# Patient Record
Sex: Female | Born: 1940 | Race: White | Hispanic: No | Smoking: Never smoker
Health system: Southern US, Community
[De-identification: ages and names within clinical notes are randomized; demographics above are authoritative.]

## PROBLEM LIST (undated history)

## (undated) ENCOUNTER — Emergency Department (HOSPITAL_COMMUNITY): Admission: EM | Discharge status: Absent without leave | Payer: Medicare HMO | Source: Home / Self Care

## (undated) DIAGNOSIS — R011 Cardiac murmur, unspecified: Secondary | ICD-10-CM

## (undated) DIAGNOSIS — G4730 Sleep apnea, unspecified: Secondary | ICD-10-CM

## (undated) DIAGNOSIS — E039 Hypothyroidism, unspecified: Secondary | ICD-10-CM

## (undated) DIAGNOSIS — K219 Gastro-esophageal reflux disease without esophagitis: Secondary | ICD-10-CM

## (undated) DIAGNOSIS — M51369 Other intervertebral disc degeneration, lumbar region without mention of lumbar back pain or lower extremity pain: Secondary | ICD-10-CM

## (undated) DIAGNOSIS — M5136 Other intervertebral disc degeneration, lumbar region: Secondary | ICD-10-CM

## (undated) DIAGNOSIS — R3915 Urgency of urination: Secondary | ICD-10-CM

## (undated) DIAGNOSIS — I639 Cerebral infarction, unspecified: Secondary | ICD-10-CM

## (undated) DIAGNOSIS — I739 Peripheral vascular disease, unspecified: Secondary | ICD-10-CM

## (undated) DIAGNOSIS — E119 Type 2 diabetes mellitus without complications: Secondary | ICD-10-CM

## (undated) DIAGNOSIS — G8929 Other chronic pain: Secondary | ICD-10-CM

## (undated) DIAGNOSIS — IMO0001 Reserved for inherently not codable concepts without codable children: Secondary | ICD-10-CM

## (undated) DIAGNOSIS — C55 Malignant neoplasm of uterus, part unspecified: Secondary | ICD-10-CM

## (undated) DIAGNOSIS — I5020 Unspecified systolic (congestive) heart failure: Secondary | ICD-10-CM

## (undated) DIAGNOSIS — M5126 Other intervertebral disc displacement, lumbar region: Secondary | ICD-10-CM

## (undated) DIAGNOSIS — I10 Essential (primary) hypertension: Secondary | ICD-10-CM

## (undated) DIAGNOSIS — M545 Low back pain, unspecified: Secondary | ICD-10-CM

## (undated) DIAGNOSIS — M1990 Unspecified osteoarthritis, unspecified site: Secondary | ICD-10-CM

## (undated) DIAGNOSIS — E785 Hyperlipidemia, unspecified: Secondary | ICD-10-CM

## (undated) DIAGNOSIS — R39198 Other difficulties with micturition: Secondary | ICD-10-CM

## (undated) HISTORY — PX: BREAST SURGERY: SHX581

## (undated) HISTORY — DX: Unspecified systolic (congestive) heart failure: I50.20

## (undated) HISTORY — DX: Hyperlipidemia, unspecified: E78.5

## (undated) HISTORY — PX: BREAST EXCISIONAL BIOPSY: SUR124

## (undated) HISTORY — DX: Type 2 diabetes mellitus without complications: E11.9

## (undated) HISTORY — PX: CARPAL TUNNEL RELEASE: SHX101

## (undated) HISTORY — PX: VAGINAL HYSTERECTOMY: SUR661

## (undated) HISTORY — PX: DILATION AND CURETTAGE OF UTERUS: SHX78

## (undated) HISTORY — PX: BALLOON ANGIOPLASTY, ARTERY: SHX564

## (undated) HISTORY — DX: Essential (primary) hypertension: I10

## (undated) HISTORY — PX: TUBAL LIGATION: SHX77

## (undated) HISTORY — DX: Peripheral vascular disease, unspecified: I73.9

## (undated) HISTORY — PX: INCISION AND DRAINAGE BREAST ABSCESS: SUR672

## (undated) HISTORY — PX: HEMORRHOID SURGERY: SHX153

---

## 2001-07-06 ENCOUNTER — Encounter: Admission: RE | Admit: 2001-07-06 | Discharge: 2001-07-06 | Payer: Self-pay | Admitting: Internal Medicine

## 2001-07-06 ENCOUNTER — Encounter: Payer: Self-pay | Admitting: Internal Medicine

## 2001-12-21 ENCOUNTER — Encounter: Payer: Self-pay | Admitting: Internal Medicine

## 2001-12-21 ENCOUNTER — Encounter: Admission: RE | Admit: 2001-12-21 | Discharge: 2001-12-21 | Payer: Self-pay | Admitting: Internal Medicine

## 2002-05-29 ENCOUNTER — Encounter: Payer: Self-pay | Admitting: Internal Medicine

## 2002-05-29 ENCOUNTER — Encounter: Admission: RE | Admit: 2002-05-29 | Discharge: 2002-05-29 | Payer: Self-pay | Admitting: Internal Medicine

## 2002-08-05 ENCOUNTER — Encounter: Payer: Self-pay | Admitting: Specialist

## 2002-08-05 ENCOUNTER — Encounter: Admission: RE | Admit: 2002-08-05 | Discharge: 2002-08-05 | Payer: Self-pay | Admitting: Specialist

## 2002-10-07 ENCOUNTER — Encounter: Admission: RE | Admit: 2002-10-07 | Discharge: 2002-10-07 | Payer: Self-pay | Admitting: Specialist

## 2002-10-07 ENCOUNTER — Encounter: Payer: Self-pay | Admitting: Specialist

## 2004-07-18 ENCOUNTER — Ambulatory Visit (HOSPITAL_COMMUNITY): Admission: RE | Admit: 2004-07-18 | Discharge: 2004-07-18 | Payer: Self-pay | Admitting: Obstetrics and Gynecology

## 2005-10-06 ENCOUNTER — Ambulatory Visit (HOSPITAL_COMMUNITY): Admission: RE | Admit: 2005-10-06 | Discharge: 2005-10-06 | Payer: Self-pay | Admitting: Internal Medicine

## 2006-11-24 ENCOUNTER — Encounter: Admission: RE | Admit: 2006-11-24 | Discharge: 2006-11-24 | Payer: Self-pay | Admitting: Internal Medicine

## 2008-08-04 ENCOUNTER — Encounter: Admission: RE | Admit: 2008-08-04 | Discharge: 2008-08-04 | Payer: Self-pay | Admitting: Internal Medicine

## 2009-08-13 ENCOUNTER — Encounter: Admission: RE | Admit: 2009-08-13 | Discharge: 2009-08-13 | Payer: Self-pay | Admitting: Internal Medicine

## 2010-07-30 ENCOUNTER — Other Ambulatory Visit: Payer: Self-pay | Admitting: *Deleted

## 2010-07-30 DIAGNOSIS — Z780 Asymptomatic menopausal state: Secondary | ICD-10-CM

## 2010-07-30 DIAGNOSIS — Z1231 Encounter for screening mammogram for malignant neoplasm of breast: Secondary | ICD-10-CM

## 2010-08-15 ENCOUNTER — Ambulatory Visit
Admission: RE | Admit: 2010-08-15 | Discharge: 2010-08-15 | Disposition: A | Discharge status: Home / Self Care | Payer: Medicare Other | Source: Ambulatory Visit | Attending: *Deleted | Admitting: *Deleted

## 2010-08-15 DIAGNOSIS — Z780 Asymptomatic menopausal state: Secondary | ICD-10-CM

## 2010-08-15 DIAGNOSIS — Z1231 Encounter for screening mammogram for malignant neoplasm of breast: Secondary | ICD-10-CM

## 2011-08-01 ENCOUNTER — Other Ambulatory Visit: Payer: Self-pay | Admitting: *Deleted

## 2011-08-01 DIAGNOSIS — Z1231 Encounter for screening mammogram for malignant neoplasm of breast: Secondary | ICD-10-CM

## 2011-08-22 ENCOUNTER — Ambulatory Visit: Payer: Medicare Other

## 2011-09-19 ENCOUNTER — Ambulatory Visit
Admission: RE | Admit: 2011-09-19 | Discharge: 2011-09-19 | Disposition: A | Discharge status: Home / Self Care | Payer: Medicare Other | Source: Ambulatory Visit | Attending: *Deleted | Admitting: *Deleted

## 2011-09-19 DIAGNOSIS — Z1231 Encounter for screening mammogram for malignant neoplasm of breast: Secondary | ICD-10-CM

## 2012-09-02 ENCOUNTER — Other Ambulatory Visit: Payer: Self-pay

## 2012-09-02 DIAGNOSIS — Z1231 Encounter for screening mammogram for malignant neoplasm of breast: Secondary | ICD-10-CM

## 2012-10-01 ENCOUNTER — Ambulatory Visit
Admission: RE | Admit: 2012-10-01 | Discharge: 2012-10-01 | Disposition: A | Discharge status: Home / Self Care | Payer: Medicare Other | Source: Ambulatory Visit

## 2012-10-01 DIAGNOSIS — Z1231 Encounter for screening mammogram for malignant neoplasm of breast: Secondary | ICD-10-CM

## 2013-08-30 ENCOUNTER — Other Ambulatory Visit: Payer: Self-pay

## 2013-08-30 DIAGNOSIS — Z1231 Encounter for screening mammogram for malignant neoplasm of breast: Secondary | ICD-10-CM

## 2013-10-05 ENCOUNTER — Ambulatory Visit
Admission: RE | Admit: 2013-10-05 | Discharge: 2013-10-05 | Disposition: A | Discharge status: Home / Self Care | Payer: Commercial Managed Care - HMO | Source: Ambulatory Visit

## 2013-10-05 DIAGNOSIS — Z1231 Encounter for screening mammogram for malignant neoplasm of breast: Secondary | ICD-10-CM

## 2014-06-14 ENCOUNTER — Ambulatory Visit (INDEPENDENT_AMBULATORY_CARE_PROVIDER_SITE_OTHER): Payer: Medicare HMO | Admitting: Podiatry

## 2014-06-14 ENCOUNTER — Ambulatory Visit (INDEPENDENT_AMBULATORY_CARE_PROVIDER_SITE_OTHER): Payer: Medicare HMO

## 2014-06-14 DIAGNOSIS — M79672 Pain in left foot: Secondary | ICD-10-CM | POA: Diagnosis not present

## 2014-06-14 DIAGNOSIS — M79671 Pain in right foot: Secondary | ICD-10-CM

## 2014-06-14 DIAGNOSIS — B351 Tinea unguium: Secondary | ICD-10-CM | POA: Diagnosis not present

## 2014-06-14 DIAGNOSIS — M722 Plantar fascial fibromatosis: Secondary | ICD-10-CM

## 2014-06-14 DIAGNOSIS — E1151 Type 2 diabetes mellitus with diabetic peripheral angiopathy without gangrene: Secondary | ICD-10-CM | POA: Diagnosis not present

## 2014-06-14 MED ORDER — TRIAMCINOLONE ACETONIDE 10 MG/ML IJ SUSP
10.0000 mg | Freq: Once | INTRAMUSCULAR | Status: AC
  Administered 2014-06-14: 10 mg

## 2014-06-14 NOTE — Progress Notes (Signed)
   Subjective:    Patient ID: Jessica Torres, female    DOB: April 29, 1940, 74 y.o.   MRN: [id number]  HPI Pt presents as a diabetic with controlled blood glucose, she c/o bilateral foot and calf pain, worse in left heel, also has thickened great left toenail   Review of Systems  HENT: Positive for hearing loss and tinnitus.   Genitourinary: Positive for frequency.  Musculoskeletal: Positive for gait problem.  Neurological: Positive for numbness.  All other systems reviewed and are negative.      Objective:   Physical Exam        Assessment & Plan:

## 2014-06-15 NOTE — Progress Notes (Signed)
Subjective:     Patient ID: Jessica Torres, female   DOB: 05-28-1940, 74 y.o.   MRN: [id number]  HPI patient presents with chronic pain in the heels of both feet and nail disease 1-5 both feet with thick yellow debris and pain and ability to wear shoe gear comfortably. States the heels that been present for several months with the left being worse   Review of Systems  All other systems reviewed and are negative.      Objective:   Physical Exam  Constitutional: She is oriented to person, place, and time.  Cardiovascular: Intact distal pulses.   Musculoskeletal: Normal range of motion.  Neurological: She is oriented to person, place, and time.  Skin: Skin is warm and dry.  Nursing note and vitals reviewed.  neurovascular status found to be unchanged with pain in the left heel and noted to have diminishment of vibratory status. Nailbeds are found to be thickened and painful in the corners of the heel is what's bothering her the most     Assessment:     Plantar fasciitis and nail disease    Plan:     H&P and condition discussed. At this time I did inject the heel carefully 3 Milligan Kenalog 5 g Xylocaine and advised on watching her sugars. I then went ahead and I debrided nails and will be seen back for routine care

## 2014-06-19 ENCOUNTER — Ambulatory Visit (HOSPITAL_COMMUNITY)
Admission: RE | Admit: 2014-06-19 | Discharge: 2014-06-19 | Disposition: A | Discharge status: Home / Self Care | Payer: Medicare HMO | Source: Ambulatory Visit | Attending: Cardiology | Admitting: Cardiology

## 2014-06-19 DIAGNOSIS — R200 Anesthesia of skin: Secondary | ICD-10-CM | POA: Diagnosis not present

## 2014-06-19 DIAGNOSIS — R202 Paresthesia of skin: Secondary | ICD-10-CM | POA: Diagnosis not present

## 2014-06-19 DIAGNOSIS — E1151 Type 2 diabetes mellitus with diabetic peripheral angiopathy without gangrene: Secondary | ICD-10-CM | POA: Diagnosis not present

## 2014-06-19 DIAGNOSIS — I739 Peripheral vascular disease, unspecified: Secondary | ICD-10-CM | POA: Diagnosis not present

## 2014-06-19 NOTE — Progress Notes (Signed)
Arterial Duplex Lower Ext. Completed. 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  Oda Cogan, BS, RDMS, RVT

## 2014-06-21 ENCOUNTER — Encounter (HOSPITAL_COMMUNITY): Payer: Commercial Managed Care - HMO

## 2014-06-28 ENCOUNTER — Encounter: Payer: Self-pay | Admitting: Cardiovascular Disease

## 2014-06-28 ENCOUNTER — Ambulatory Visit (INDEPENDENT_AMBULATORY_CARE_PROVIDER_SITE_OTHER): Payer: Medicare HMO | Admitting: Cardiovascular Disease

## 2014-06-28 VITALS — BP 122/64 | HR 80 | Ht 65.0 in | Wt 141.7 lb

## 2014-06-28 DIAGNOSIS — R5381 Other malaise: Secondary | ICD-10-CM

## 2014-06-28 DIAGNOSIS — I739 Peripheral vascular disease, unspecified: Secondary | ICD-10-CM | POA: Diagnosis not present

## 2014-06-28 DIAGNOSIS — E785 Hyperlipidemia, unspecified: Secondary | ICD-10-CM

## 2014-06-28 DIAGNOSIS — E1059 Type 1 diabetes mellitus with other circulatory complications: Secondary | ICD-10-CM | POA: Insufficient documentation

## 2014-06-28 DIAGNOSIS — E1169 Type 2 diabetes mellitus with other specified complication: Secondary | ICD-10-CM | POA: Insufficient documentation

## 2014-06-28 DIAGNOSIS — E1165 Type 2 diabetes mellitus with hyperglycemia: Secondary | ICD-10-CM

## 2014-06-28 DIAGNOSIS — R5383 Other fatigue: Secondary | ICD-10-CM

## 2014-06-28 DIAGNOSIS — E1151 Type 2 diabetes mellitus with diabetic peripheral angiopathy without gangrene: Secondary | ICD-10-CM

## 2014-06-28 DIAGNOSIS — D689 Coagulation defect, unspecified: Secondary | ICD-10-CM | POA: Diagnosis not present

## 2014-06-28 DIAGNOSIS — Z01818 Encounter for other preprocedural examination: Secondary | ICD-10-CM

## 2014-06-28 DIAGNOSIS — E119 Type 2 diabetes mellitus without complications: Secondary | ICD-10-CM | POA: Insufficient documentation

## 2014-06-28 DIAGNOSIS — I152 Hypertension secondary to endocrine disorders: Secondary | ICD-10-CM | POA: Insufficient documentation

## 2014-06-28 DIAGNOSIS — I10 Essential (primary) hypertension: Secondary | ICD-10-CM

## 2014-06-28 DIAGNOSIS — E1159 Type 2 diabetes mellitus with other circulatory complications: Secondary | ICD-10-CM | POA: Insufficient documentation

## 2014-06-28 NOTE — Assessment & Plan Note (Signed)
History of peripheral arterial disease with a two-month history of lifestyle limiting claudication symmetric bilaterally. Recent Dopplers performed 06/19/14 revealed ABIs in the mid 0.5 range with occluded SFAs bilaterally. Based on this, we decided to proceed with angiography and potential endovascular therapy.

## 2014-06-28 NOTE — Assessment & Plan Note (Signed)
History of hypertension blood pressure measured today at 122/64. She is on losartan 50 mg a day. Continue current meds at current dosing

## 2014-06-28 NOTE — Patient Instructions (Addendum)
Dr. Gwenlyn Found has ordered a peripheral angiogram on Thursday, April 28th, to be done at [facility].  This procedure is going to look at the bloodflow in your lower extremities.  If Dr. Gwenlyn Found is able to open up the arteries, you will have to spend one night in the hospital.  If he is not able to open the arteries, you will be able to go home that same day.    After the procedure, you will not be allowed to drive for 3 days or push, pull, or lift anything greater than 10 lbs for one week.    You will be required to have the following tests prior to the procedure:  1. Blood work-the blood work can be done no more than 7 days prior to the procedure.  It can be done at any [facility] lab.  There is one downstairs on the first floor of this building and one in the [place] ([address])  2. Chest Xray-the chest xray order has already been placed at the [facility].     *REPS Scott   Right groin access

## 2014-06-28 NOTE — Assessment & Plan Note (Signed)
History of hyperlipidemia with statin intolerance

## 2014-06-28 NOTE — Progress Notes (Signed)
06/28/2014 SENAI LEBOVITS   November 02, 1940  [id number]  Primary Physician [name], DO Primary Cardiologist: Lorretta Harp MD Renae Gloss   HPI:  Mrs. Hellstrom is a 74 year old divorced Caucasian female mother of 4 children, grandmother of 9 grandchildren referred by Dr. Felisa Bonier, podiatrist, for evaluation of her 4 children disease. Her primary care physician is Dr. [name]. She is retired from working at [employer]. Her cardiac risk factor profile is notable for treated hypertension and diabetes. She does have hyperlipidemia but a statin intolerant. 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 She's never had a heart attack. She denies chest pain or shortness of breath. There is no family history of heart disease. She's noticed bilateral lower extremity lifestyle limiting claudication of the last several months. Recent lower extremity arterial Doppler studies performed 06/19/14 revealed ABIs in the mid 0.5 range bilaterally with occluded SFAs.   Current Outpatient Prescriptions  Medication Sig Dispense Refill  . alendronate (FOSAMAX) 10 MG tablet Take 10 mg by mouth once a week. Take with a full glass of water on an empty stomach.    Marland Kitchen aspirin 81 MG tablet Take 81 mg by mouth daily.    Marland Kitchen glimepiride (AMARYL) 4 MG tablet Take 4 mg by mouth daily with breakfast.    . levothyroxine (SYNTHROID, LEVOTHROID) 75 MCG tablet Take 75 mcg by mouth daily before breakfast.    . losartan (COZAAR) 50 MG tablet Take 50 mg by mouth daily.    . metFORMIN (GLUCOPHAGE) 1000 MG tablet Take 1,000 mg by mouth 2 (two) times daily with a meal.    . pantoprazole (PROTONIX) 40 MG tablet Take 1 tablet by mouth daily.    . sitaGLIPtin (JANUVIA) 100 MG tablet Take 100 mg by mouth daily.     No current facility-administered medications for this visit.    Allergies  Allergen Reactions  . Morphine And Related Swelling    Facial swelling    History   Social History  . Marital  Status: Divorced    Spouse Name: N/A  . Number of Children: N/A  . Years of Education: N/A   Occupational History  . Not on file.   Social History Main Topics  . Smoking status: Never Smoker   . Smokeless tobacco: Not on file  . Alcohol Use: Not on file  . Drug Use: Not on file  . Sexual Activity: Not on file   Other Topics Concern  . Not on file   Social History Narrative     Review of Systems: General: negative for chills, fever, night sweats or weight changes.  Cardiovascular: negative for chest pain, dyspnea on exertion, edema, orthopnea, palpitations, paroxysmal nocturnal dyspnea or shortness of breath Dermatological: negative for rash Respiratory: negative for cough or wheezing Urologic: negative for hematuria Abdominal: negative for nausea, vomiting, diarrhea, bright red blood per rectum, melena, or hematemesis Neurologic: negative for visual changes, syncope, or dizziness All other systems reviewed and are otherwise negative except as noted above.    Blood pressure 122/64, pulse 80, height 5\' 5"  (1.651 m), weight 141 lb 11.2 oz (64.275 kg).  General appearance: alert and no distress Neck: no adenopathy, no carotid bruit, no JVD, supple, symmetrical, trachea midline and thyroid not enlarged, symmetric, no tenderness/mass/nodules Lungs: clear to auscultation bilaterally Heart: regular rate and rhythm, S1, S2 normal, no murmur, click, rub or gallop Extremities: extremities normal, atraumatic, no cyanosis or edema and femoral arteries are 2+ without bruits  EKG not performed today  ASSESSMENT AND PLAN:   Hyperlipidemia History of hyperlipidemia with statin intolerance   Essential hypertension History of hypertension blood pressure measured today at 122/64. She is on losartan 50 mg a day. Continue current meds at current dosing   Peripheral arterial disease History of peripheral arterial disease with a two-month history of lifestyle limiting claudication  symmetric bilaterally. Recent Dopplers performed 06/19/14 revealed ABIs in the mid 0.5 range with occluded SFAs bilaterally. Based on this, we decided to proceed with angiography and potential endovascular therapy.       Lorretta Harp MD FACP,FACC,FAHA, [facility] 06/28/2014 2:53 PM

## 2014-06-29 ENCOUNTER — Ambulatory Visit
Admission: RE | Admit: 2014-06-29 | Discharge: 2014-06-29 | Disposition: A | Discharge status: Home / Self Care | Payer: Medicare HMO | Source: Ambulatory Visit | Attending: Cardiovascular Disease | Admitting: Cardiovascular Disease

## 2014-06-29 DIAGNOSIS — Z01818 Encounter for other preprocedural examination: Secondary | ICD-10-CM

## 2014-06-29 DIAGNOSIS — I739 Peripheral vascular disease, unspecified: Secondary | ICD-10-CM

## 2014-06-30 LAB — CBC
HCT: 41.9 % (ref 36.0–46.0)
Hemoglobin: 13.8 g/dL (ref 12.0–15.0)
MCH: 29 pg (ref 26.0–34.0)
MCHC: 32.9 g/dL (ref 30.0–36.0)
MCV: 88 fL (ref 78.0–100.0)
MPV: 10.6 fL (ref 8.6–12.4)
PLATELETS: 360 10*3/uL (ref 150–400)
RBC: 4.76 MIL/uL (ref 3.87–5.11)
RDW: 14.3 % (ref 11.5–15.5)
WBC: 8.9 10*3/uL (ref 4.0–10.5)

## 2014-06-30 LAB — TSH: TSH: 3.691 u[IU]/mL (ref 0.350–4.500)

## 2014-06-30 LAB — BASIC METABOLIC PANEL
BUN: 13 mg/dL (ref 6–23)
CHLORIDE: 101 meq/L (ref 96–112)
CO2: 23 meq/L (ref 19–32)
Calcium: 9.5 mg/dL (ref 8.4–10.5)
Creat: 0.91 mg/dL (ref 0.50–1.10)
GLUCOSE: 144 mg/dL — AB (ref 70–99)
POTASSIUM: 4.2 meq/L (ref 3.5–5.3)
SODIUM: 138 meq/L (ref 135–145)

## 2014-06-30 LAB — PROTIME-INR
INR: 0.91 (ref <1.50)
Prothrombin Time: 12.3 seconds (ref 11.6–15.2)

## 2014-06-30 LAB — APTT: aPTT: 28 seconds (ref 24–37)

## 2014-07-04 ENCOUNTER — Telehealth: Payer: Self-pay | Admitting: Cardiovascular Disease

## 2014-07-04 ENCOUNTER — Encounter: Payer: Self-pay | Admitting: *Deleted

## 2014-07-04 NOTE — Telephone Encounter (Signed)
lmom 

## 2014-07-04 NOTE — Telephone Encounter (Signed)
The patient called and had questions pertaining to medications she can take before her procedure on Thursday, 07-06-14.  Please call her at [phone].

## 2014-07-06 ENCOUNTER — Encounter (HOSPITAL_COMMUNITY): Payer: Self-pay | Admitting: General Practice

## 2014-07-06 ENCOUNTER — Encounter (HOSPITAL_COMMUNITY)
Admission: RE | Disposition: A | Discharge status: Home / Self Care | Payer: Self-pay | Source: Ambulatory Visit | Attending: Cardiovascular Disease

## 2014-07-06 ENCOUNTER — Ambulatory Visit (HOSPITAL_COMMUNITY)
Admission: RE | Admit: 2014-07-06 | Discharge: 2014-07-07 | Disposition: A | Discharge status: Home / Self Care | Payer: Medicare HMO | Source: Ambulatory Visit | Attending: Cardiovascular Disease | Admitting: Cardiovascular Disease

## 2014-07-06 DIAGNOSIS — E1165 Type 2 diabetes mellitus with hyperglycemia: Secondary | ICD-10-CM | POA: Insufficient documentation

## 2014-07-06 DIAGNOSIS — Z794 Long term (current) use of insulin: Secondary | ICD-10-CM | POA: Insufficient documentation

## 2014-07-06 DIAGNOSIS — E1151 Type 2 diabetes mellitus with diabetic peripheral angiopathy without gangrene: Secondary | ICD-10-CM

## 2014-07-06 DIAGNOSIS — I152 Hypertension secondary to endocrine disorders: Secondary | ICD-10-CM | POA: Diagnosis present

## 2014-07-06 DIAGNOSIS — E1169 Type 2 diabetes mellitus with other specified complication: Secondary | ICD-10-CM | POA: Diagnosis present

## 2014-07-06 DIAGNOSIS — E785 Hyperlipidemia, unspecified: Secondary | ICD-10-CM | POA: Diagnosis present

## 2014-07-06 DIAGNOSIS — E1059 Type 1 diabetes mellitus with other circulatory complications: Secondary | ICD-10-CM | POA: Diagnosis present

## 2014-07-06 DIAGNOSIS — Z8673 Personal history of transient ischemic attack (TIA), and cerebral infarction without residual deficits: Secondary | ICD-10-CM | POA: Diagnosis not present

## 2014-07-06 DIAGNOSIS — I739 Peripheral vascular disease, unspecified: Secondary | ICD-10-CM | POA: Diagnosis present

## 2014-07-06 DIAGNOSIS — E119 Type 2 diabetes mellitus without complications: Secondary | ICD-10-CM | POA: Diagnosis present

## 2014-07-06 DIAGNOSIS — I10 Essential (primary) hypertension: Secondary | ICD-10-CM | POA: Diagnosis not present

## 2014-07-06 DIAGNOSIS — I70203 Unspecified atherosclerosis of native arteries of extremities, bilateral legs: Secondary | ICD-10-CM | POA: Diagnosis present

## 2014-07-06 DIAGNOSIS — Z9862 Peripheral vascular angioplasty status: Secondary | ICD-10-CM

## 2014-07-06 DIAGNOSIS — E1159 Type 2 diabetes mellitus with other circulatory complications: Secondary | ICD-10-CM | POA: Diagnosis present

## 2014-07-06 DIAGNOSIS — I7092 Chronic total occlusion of artery of the extremities: Secondary | ICD-10-CM | POA: Diagnosis not present

## 2014-07-06 DIAGNOSIS — I70211 Atherosclerosis of native arteries of extremities with intermittent claudication, right leg: Secondary | ICD-10-CM | POA: Diagnosis not present

## 2014-07-06 DIAGNOSIS — I70213 Atherosclerosis of native arteries of extremities with intermittent claudication, bilateral legs: Secondary | ICD-10-CM | POA: Insufficient documentation

## 2014-07-06 HISTORY — DX: Malignant neoplasm of uterus, part unspecified: C55

## 2014-07-06 HISTORY — DX: Sleep apnea, unspecified: G47.30

## 2014-07-06 HISTORY — DX: Peripheral vascular disease, unspecified: I73.9

## 2014-07-06 HISTORY — DX: Other intervertebral disc displacement, lumbar region: M51.26

## 2014-07-06 HISTORY — DX: Urgency of urination: R39.15

## 2014-07-06 HISTORY — DX: Hypothyroidism, unspecified: E03.9

## 2014-07-06 HISTORY — DX: Low back pain, unspecified: M54.50

## 2014-07-06 HISTORY — DX: Cerebral infarction, unspecified: I63.9

## 2014-07-06 HISTORY — DX: Gastro-esophageal reflux disease without esophagitis: K21.9

## 2014-07-06 HISTORY — DX: Unspecified osteoarthritis, unspecified site: M19.90

## 2014-07-06 HISTORY — DX: Other chronic pain: G89.29

## 2014-07-06 HISTORY — DX: Other difficulties with micturition: R39.198

## 2014-07-06 HISTORY — DX: Other intervertebral disc degeneration, lumbar region without mention of lumbar back pain or lower extremity pain: M51.369

## 2014-07-06 HISTORY — PX: LOWER EXTREMITY ANGIOGRAM: SHX5508

## 2014-07-06 HISTORY — DX: Low back pain: M54.5

## 2014-07-06 HISTORY — DX: Other intervertebral disc degeneration, lumbar region: M51.36

## 2014-07-06 LAB — GLUCOSE, CAPILLARY
GLUCOSE-CAPILLARY: 246 mg/dL — AB (ref 70–99)
Glucose-Capillary: 231 mg/dL — ABNORMAL HIGH (ref 70–99)
Glucose-Capillary: 206 mg/dL — ABNORMAL HIGH (ref 70–99)
Glucose-Capillary: 199 mg/dL — ABNORMAL HIGH (ref 70–99)

## 2014-07-06 LAB — POCT ACTIVATED CLOTTING TIME
Activated Clotting Time: 208 seconds
Activated Clotting Time: 251 seconds
Activated Clotting Time: 177 seconds

## 2014-07-06 SURGERY — ANGIOGRAM, LOWER EXTREMITY
Anesthesia: LOCAL

## 2014-07-06 MED ORDER — HEPARIN (PORCINE) IN NACL 2-0.9 UNIT/ML-% IJ SOLN
INTRAMUSCULAR | Status: AC
  Filled 2014-07-06: qty 500

## 2014-07-06 MED ORDER — LINAGLIPTIN 5 MG PO TABS
5.0000 mg | ORAL_TABLET | Freq: Every day | ORAL | Status: DC
  Administered 2014-07-06 – 2014-07-07 (×2): 5 mg via ORAL
  Filled 2014-07-06 (×3): qty 1

## 2014-07-06 MED ORDER — SODIUM CHLORIDE 0.9 % IV SOLN: INTRAVENOUS | Status: AC

## 2014-07-06 MED ORDER — CLOPIDOGREL BISULFATE 300 MG PO TABS
ORAL_TABLET | ORAL | Status: AC
  Filled 2014-07-06: qty 2

## 2014-07-06 MED ORDER — LEVOTHYROXINE SODIUM 75 MCG PO TABS
75.0000 ug | ORAL_TABLET | Freq: Every day | ORAL | Status: DC
  Administered 2014-07-07: 75 ug via ORAL
  Filled 2014-07-06 (×2): qty 1

## 2014-07-06 MED ORDER — FENTANYL CITRATE (PF) 100 MCG/2ML IJ SOLN
INTRAMUSCULAR | Status: AC
  Filled 2014-07-06: qty 2

## 2014-07-06 MED ORDER — ONDANSETRON HCL 4 MG/2ML IJ SOLN
4.0000 mg | Freq: Four times a day (QID) | INTRAMUSCULAR | Status: DC | PRN
Start: 2014-07-06 — End: 2014-07-07

## 2014-07-06 MED ORDER — ASPIRIN EC 325 MG PO TBEC
325.0000 mg | DELAYED_RELEASE_TABLET | Freq: Every day | ORAL | Status: DC
  Administered 2014-07-07: 10:00:00 325 mg via ORAL
  Filled 2014-07-06: qty 1

## 2014-07-06 MED ORDER — HEPARIN SODIUM (PORCINE) 1000 UNIT/ML IJ SOLN
INTRAMUSCULAR | Status: AC
  Filled 2014-07-06: qty 1

## 2014-07-06 MED ORDER — MIDAZOLAM HCL 2 MG/2ML IJ SOLN
INTRAMUSCULAR | Status: AC
  Filled 2014-07-06: qty 2

## 2014-07-06 MED ORDER — LOSARTAN POTASSIUM 50 MG PO TABS
50.0000 mg | ORAL_TABLET | Freq: Every day | ORAL | Status: DC
  Administered 2014-07-07: 10:00:00 50 mg via ORAL
  Filled 2014-07-06: qty 1

## 2014-07-06 MED ORDER — SODIUM CHLORIDE 0.9 % IJ SOLN: 3.0000 mL | INTRAMUSCULAR | Status: DC | PRN

## 2014-07-06 MED ORDER — SODIUM CHLORIDE 0.9 % IV SOLN
INTRAVENOUS | Status: DC
  Administered 2014-07-06: 1000 mL via INTRAVENOUS

## 2014-07-06 MED ORDER — PANTOPRAZOLE SODIUM 40 MG PO TBEC
40.0000 mg | DELAYED_RELEASE_TABLET | Freq: Every day | ORAL | Status: DC
  Administered 2014-07-07: 07:00:00 40 mg via ORAL
  Filled 2014-07-06: qty 1

## 2014-07-06 MED ORDER — ASPIRIN 81 MG PO CHEW: 81.0000 mg | CHEWABLE_TABLET | ORAL | Status: DC

## 2014-07-06 MED ORDER — CLOPIDOGREL BISULFATE 75 MG PO TABS
75.0000 mg | ORAL_TABLET | Freq: Every day | ORAL | Status: DC
  Administered 2014-07-07: 10:00:00 75 mg via ORAL
  Filled 2014-07-06: qty 1

## 2014-07-06 MED ORDER — LIDOCAINE HCL (PF) 1 % IJ SOLN
INTRAMUSCULAR | Status: AC
  Filled 2014-07-06: qty 30

## 2014-07-06 MED ORDER — GLIMEPIRIDE 4 MG PO TABS
4.0000 mg | ORAL_TABLET | Freq: Every day | ORAL | Status: DC
  Administered 2014-07-07: 07:00:00 4 mg via ORAL
  Filled 2014-07-06 (×2): qty 1

## 2014-07-06 MED ORDER — ACETAMINOPHEN 325 MG PO TABS
650.0000 mg | ORAL_TABLET | ORAL | Status: DC | PRN
  Administered 2014-07-06: 22:00:00 650 mg via ORAL
  Filled 2014-07-06: qty 2

## 2014-07-06 MED ORDER — ASPIRIN EC 81 MG PO TBEC: 81.0000 mg | DELAYED_RELEASE_TABLET | Freq: Every day | ORAL | Status: DC

## 2014-07-06 NOTE — Research (Signed)
Safe-DCB [place] Registry Informed Consent   Subject Name: Jessica Torres  Subject met inclusion and exclusion criteria.  The informed consent form, study requirements and expectations were reviewed with the subject and questions and concerns were addressed prior to the signing of the consent form.  The subject verbalized understanding of the trail requirements.  The subject agreed to participate in the SAFE trial and signed the informed consent.  The informed consent was obtained prior to performance of any protocol-specific procedures for the subject.  A copy of the signed informed consent was given to the subject and a copy was placed in the subject's medical record.  Sandie Ano 07/06/2014, 7:45

## 2014-07-06 NOTE — H&P (View-Only) (Signed)
06/28/2014 Jessica Torres   01-10-1941  [id number]  Primary Physician [name], DO Primary Cardiologist: Lorretta Harp MD Renae Gloss   HPI:  Jessica Torres is a 74 year old divorced Caucasian female mother of 4 children, grandmother of 9 grandchildren referred by Dr. Felisa Bonier, podiatrist, for evaluation of her 4 children disease. Her primary care physician is Dr. [name]. She is retired from working at [employer]. Her cardiac risk factor profile is notable for treated hypertension and diabetes. She does have hyperlipidemia but a statin intolerant. 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 She's never had a heart attack. She denies chest pain or shortness of breath. There is no family history of heart disease. She's noticed bilateral lower extremity lifestyle limiting claudication of the last several months. Recent lower extremity arterial Doppler studies performed 06/19/14 revealed ABIs in the mid 0.5 range bilaterally with occluded SFAs.   Current Outpatient Prescriptions  Medication Sig Dispense Refill  . alendronate (FOSAMAX) 10 MG tablet Take 10 mg by mouth once a week. Take with a full glass of water on an empty stomach.    Marland Kitchen aspirin 81 MG tablet Take 81 mg by mouth daily.    Marland Kitchen glimepiride (AMARYL) 4 MG tablet Take 4 mg by mouth daily with breakfast.    . levothyroxine (SYNTHROID, LEVOTHROID) 75 MCG tablet Take 75 mcg by mouth daily before breakfast.    . losartan (COZAAR) 50 MG tablet Take 50 mg by mouth daily.    . metFORMIN (GLUCOPHAGE) 1000 MG tablet Take 1,000 mg by mouth 2 (two) times daily with a meal.    . pantoprazole (PROTONIX) 40 MG tablet Take 1 tablet by mouth daily.    . sitaGLIPtin (JANUVIA) 100 MG tablet Take 100 mg by mouth daily.     No current facility-administered medications for this visit.    Allergies  Allergen Reactions  . Morphine And Related Swelling    Facial swelling    History   Social History  . Marital  Status: Divorced    Spouse Name: N/A  . Number of Children: N/A  . Years of Education: N/A   Occupational History  . Not on file.   Social History Main Topics  . Smoking status: Never Smoker   . Smokeless tobacco: Not on file  . Alcohol Use: Not on file  . Drug Use: Not on file  . Sexual Activity: Not on file   Other Topics Concern  . Not on file   Social History Narrative     Review of Systems: General: negative for chills, fever, night sweats or weight changes.  Cardiovascular: negative for chest pain, dyspnea on exertion, edema, orthopnea, palpitations, paroxysmal nocturnal dyspnea or shortness of breath Dermatological: negative for rash Respiratory: negative for cough or wheezing Urologic: negative for hematuria Abdominal: negative for nausea, vomiting, diarrhea, bright red blood per rectum, melena, or hematemesis Neurologic: negative for visual changes, syncope, or dizziness All other systems reviewed and are otherwise negative except as noted above.    Blood pressure 122/64, pulse 80, height 5\' 5"  (1.651 m), weight 141 lb 11.2 oz (64.275 kg).  General appearance: alert and no distress Neck: no adenopathy, no carotid bruit, no JVD, supple, symmetrical, trachea midline and thyroid not enlarged, symmetric, no tenderness/mass/nodules Lungs: clear to auscultation bilaterally Heart: regular rate and rhythm, S1, S2 normal, no murmur, click, rub or gallop Extremities: extremities normal, atraumatic, no cyanosis or edema and femoral arteries are 2+ without bruits  EKG not performed today  ASSESSMENT AND PLAN:   Hyperlipidemia History of hyperlipidemia with statin intolerance   Essential hypertension History of hypertension blood pressure measured today at 122/64. She is on losartan 50 mg a day. Continue current meds at current dosing   Peripheral arterial disease History of peripheral arterial disease with a two-month history of lifestyle limiting claudication  symmetric bilaterally. Recent Dopplers performed 06/19/14 revealed ABIs in the mid 0.5 range with occluded SFAs bilaterally. Based on this, we decided to proceed with angiography and potential endovascular therapy.       Lorretta Harp MD FACP,FACC,FAHA, [facility] 06/28/2014 2:53 PM

## 2014-07-06 NOTE — Progress Notes (Signed)
Site area: LFA Site Prior to Removal:  Level 0 Pressure Applied For:40min Manual:   yes Patient Status During Pull: stable  Post Pull Site:  Level 0 Post Pull Instructions Given:yes   Post Pull Pulses Present: doppler Dressing Applied:  clear Bedrest begins @ 1400 Comments:

## 2014-07-06 NOTE — CV Procedure (Signed)
SAKARI RAISANEN is a 74 y.o. female    [id number] LOCATION:  FACILITY: [facility]  PHYSICIAN: Quay Burow, M.D. April 17, 1940   DATE OF PROCEDURE:  07/06/2014  DATE OF DISCHARGE:     PV Angiogram/Intervention    History obtained from chart review.Mrs. Hinojosa is a 75 year old divorced Caucasian female mother of 4 children, grandmother of 9 grandchildren referred by Dr. Paulla Dolly, her podiatrist, for evaluation of peripheral vascular disease. Her primary care physician is Dr. [name]. She is retired from working at [employer]. Her cardiac risk factor profile is notable for treated hypertension and diabetes. She does have hyperlipidemia but is statin intolerant. 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 She's never had a heart attack. She denies chest pain or shortness of breath. There is no family history of heart disease. She's noticed bilateral lower extremity lifestyle limiting claudication over the last several months. Recent lower extremity arterial Doppler studies performed 06/19/14 revealed ABIs in the mid 0.5 range bilaterally with occluded SFAs. She presents today for lower extremity angiography and percutaneous intervention for lifestyle limiting claudication.  Pre Procedure Diagnosis: peripheral arterial disease  Post Procedure Diagnosis: peripheral arterial disease  Operators: Dr. Quay Burow  Procedures Performed:  1. Abdominal aortogram  2. Bilateral iliac angiogram  3. Bifemoral runoff  4. Hawk 1 atherectomy, drug eluding balloon angioplasty chronically occluded right SFA  PROCEDURE DESCRIPTION:   The patient was brought to the second floor [facility] lab in the the postabsorptive state. She was premedicated with Valium 5 mg by mouth, IV Versed and fentanyl. Her left groin was prepped and shaved in usual sterile fashion. Xylocaine 1% was used for local anesthesia. A 5 French sheath was inserted into the left common femoral  artery using standard  Seldinger technique. A 5 French pigtail catheter was placed in the mid abdominal aorta. This showed double aortography, distal abdominal aortography, bilateral iliac angiography with bifemoral runoff was performed using bolus chase digital subtraction step table technique. Visipaque dye was used for the entirety of the case. Retrograde aorta, left ventricular and pullback pressures were recorded.   HEMODYNAMICS:    AO SYSTOLIC/AO DIASTOLIC: 419/62   Angiographic Data:   1: Abdominal aortogram-the renal arteries are widely patent. The infrarenal, aorta and iliac bifurcation were free of significant atherosclerotic changes.  2: Left lower extremity does occluded left SFA in the proximal to mid section with reconstitution of the distal SFA in the adductor canal by profunda femoris collaterals. There was three-vessel runoff  3: Right lower extremity-chronic total occlusion moderately long segment mid right SFA with reconstitution in the above-the-knee popliteal/adductor canal by profunda femoris collaterals. There was two-vessel runoff    IMPRESSION:bilaterally occluded SFAs. We will proceed with directional atherectomy and drug-eluting balloon angioplasty. The patient was consented for the "safe protocol".  Procedure Description:contralateral access was obtained with crossover catheter, 0.35 cm Versicore  wire and 7 [place] multipurpose 55 cm long Ansel sheath. The patient received a total of 8000 units of heparin with an ACT of 251. Total contrast administered the patient was 197 mL. I was able to cross the chronic total occlusion with a Viance CTO catheter along with a Nitrex wire. I then placed a 6 mm's Spider distal protection device in the P2 segment of the popliteal artery. I performed directional atherectomy with a Hawk 1 atherectomy device performing multiple circumferential cuts removing a copious amount of atherosclerotic plaque.I then followed this with drug eluting balloon angioplasty  using a Lutonix  5 x 150 mm long balloon at 4 atm for 3 minutes resulting reduction and a chronic total occlusion to 0% residual excellent flow and no dissection. The patient tolerated the procedure well. The distal protection device was then retrieved, completion angiography performed, and the sheath withdrawn across the bifurcation and exchanged over wire for a short 7 [place] sheath. This was then secured in place.  Final Impression: successful Hawk 1 atherectomy followed by drug-eluting balloon edge plasty of a chronic totally occluded right SFA. The patient does have a chronic totally occluded left SFA which we will need to address in a staged fashion. She received 600 mg of Plavix. The sheath will be removed and pressure held once the ACT falls below 170. She will be hydrated overnight, discharged home in the morning. She will be on aspirin and Plavix. We will obtain lower extremity arterial Doppler studies in our [place] office next week. I will see her back in 3 weeks for follow-up at which time I will schedule the left SFA intervention.    Lorretta Harp MD, [facility] 07/06/2014 12:15 PM

## 2014-07-06 NOTE — Interval H&P Note (Signed)
History and Physical Interval Note:  07/06/2014 10:44 AM  Jessica Torres  has presented today for surgery, with the diagnosis of pad  The various methods of treatment have been discussed with the patient and family. After consideration of risks, benefits and other options for treatment, the patient has consented to  Procedure(s): LOWER EXTREMITY ANGIOGRAM (N/A) as a surgical intervention .  The patient's history has been reviewed, patient examined, no change in status, stable for surgery.  I have reviewed the patient's chart and labs.  Questions were answered to the patient's satisfaction.     Lorretta Harp

## 2014-07-07 ENCOUNTER — Telehealth: Payer: Self-pay | Admitting: Cardiovascular Disease

## 2014-07-07 ENCOUNTER — Other Ambulatory Visit: Payer: Self-pay | Admitting: Cardiology

## 2014-07-07 DIAGNOSIS — I739 Peripheral vascular disease, unspecified: Secondary | ICD-10-CM | POA: Diagnosis not present

## 2014-07-07 DIAGNOSIS — Z9862 Peripheral vascular angioplasty status: Secondary | ICD-10-CM

## 2014-07-07 DIAGNOSIS — Z9889 Other specified postprocedural states: Secondary | ICD-10-CM | POA: Diagnosis not present

## 2014-07-07 DIAGNOSIS — I70213 Atherosclerosis of native arteries of extremities with intermittent claudication, bilateral legs: Secondary | ICD-10-CM | POA: Diagnosis not present

## 2014-07-07 LAB — CBC
HCT: 40.2 % (ref 36.0–46.0)
Hemoglobin: 13.1 g/dL (ref 12.0–15.0)
MCH: 28.9 pg (ref 26.0–34.0)
MCHC: 32.6 g/dL (ref 30.0–36.0)
MCV: 88.5 fL (ref 78.0–100.0)
PLATELETS: 322 10*3/uL (ref 150–400)
RBC: 4.54 MIL/uL (ref 3.87–5.11)
RDW: 13.4 % (ref 11.5–15.5)
WBC: 10.6 10*3/uL — ABNORMAL HIGH (ref 4.0–10.5)

## 2014-07-07 LAB — BASIC METABOLIC PANEL
ANION GAP: 8 (ref 5–15)
BUN: 12 mg/dL (ref 6–23)
CHLORIDE: 102 mmol/L (ref 96–112)
CO2: 27 mmol/L (ref 19–32)
Calcium: 9.3 mg/dL (ref 8.4–10.5)
Creatinine, Ser: 1.04 mg/dL (ref 0.50–1.10)
GFR, EST AFRICAN AMERICAN: 61 mL/min — AB (ref >90)
GFR, EST NON AFRICAN AMERICAN: 53 mL/min — AB (ref >90)
Glucose, Bld: 178 mg/dL — ABNORMAL HIGH (ref 70–99)
POTASSIUM: 4.2 mmol/L (ref 3.5–5.1)
Sodium: 137 mmol/L (ref 135–145)

## 2014-07-07 LAB — GLUCOSE, CAPILLARY: Glucose-Capillary: 189 mg/dL — ABNORMAL HIGH (ref 70–99)

## 2014-07-07 MED ORDER — ACETAMINOPHEN 325 MG PO TABS: 650.0000 mg | ORAL_TABLET | ORAL | Status: DC | PRN

## 2014-07-07 MED ORDER — METFORMIN HCL 1000 MG PO TABS
1000.0000 mg | ORAL_TABLET | Freq: Two times a day (BID) | ORAL | Status: DC
Start: 2014-07-09 — End: 2015-01-23

## 2014-07-07 MED ORDER — CLOPIDOGREL BISULFATE 75 MG PO TABS: 75.0000 mg | ORAL_TABLET | Freq: Every day | ORAL | Status: DC

## 2014-07-07 NOTE — Progress Notes (Signed)
Patient heat rate elevated to 125 when OOB denies any complications or pain BP 117/77.

## 2014-07-07 NOTE — Telephone Encounter (Signed)
Closed encounter °

## 2014-07-07 NOTE — Discharge Instructions (Signed)
Arteriogram ° An arteriogram (or angiogram) is an X-ray test of your blood vessels. You will be awake during the test. This test looks for: °· Blocked blood vessels. °· Blood vessels that are not normal. °BEFORE THE TEST °Schedule an appointment for your arteriogram. Let the person know if: °· You have diabetes. °· You are allergic to any food or medicine. °· You are allergic to X-ray dye (contrast). °· You have asthma or had it when you were a child. °· You are pregnant or you might be pregnant. °· You are taking aspirin or blood thinners. °The night before the test:  °· Do not  eat or drink anything after midnight. °On the morning of your test:  °· Do not drive. Have someone bring you and take you home. °· Bring all the medicines you need to take with you. If you have diabetes, do not take your insulin before the test, but bring it with you. °THE TEST °· You will lie on the X-ray table. °· An IV tube is started in your arm. °· Your blood pressure and heartbeat are checked during the test. °· The upper part of your leg (groin) is shaved and washed with special soap. °· You are then covered with a germ free (sterile) sheet. Keep your arms at your side under the sheet at all times so that germs do not get on the sheet. °· The skin is numbed where the thin tube (catheter) is put into your upper leg. The thin tube is moved up into the blood vessels that your doctor wants to see. °· Dye is put in through the tube. You may have a feeling of heat as the dye moves into your body. °· X-ray pictures of your blood vessels are taken. Do not move while the dye goes in and pictures are taken. °AFTER THE TEST °· The thin tube will be taken out of your upper leg. °· The nurse will check: °¨ Your blood pressure. °¨ The place where the thin tube was taken out to make sure it is not bleeding. °¨ The blood flow to your feet. °· Lie flat in bed. Keep your leg straight for 6 hours after the thin tube is taken out. °· Ask your doctor or  nurse if you should take your regular medications that you brought. °Finding out the results of your test °Ask when your test results will be ready. Make sure you get your test results. °Document Released: 05/23/2008 Document Revised: 03/01/2013 Document Reviewed: 05/23/2008 °ExitCare® Patient Information ©2015 [employer]. This information is not intended to replace advice given to you by your health care provider. Make sure you discuss any questions you have with your health care provider. ° °

## 2014-07-07 NOTE — Discharge Summary (Signed)
Patient ID: Jessica Torres,  MRN: [id number], DOB/AGE: Dec 18, 1940 74 y.o.  Admit date: 07/06/2014 Discharge date: 07/07/2014  Primary Care Provider: [name], DO Primary Cardiologist: Dr Gwenlyn Found  Discharge Diagnoses Principal Problem:   Claudication Active Problems:   Peripheral arterial disease - Bilateral SFA CTO   Status post peripheral artery angioplasty - [facility] 1 Atherectomy with Drug Eluting Balloon Angioplasty of  chronic totally occluded right SFA.    Essential hypertension   Type 2 diabetes, uncontrolled, with peripheral circulatory disorder   Hyperlipidemia-statin intolerant    Procedures: PV angiogram, turbohawk atherectomy Rt SFA 07/06/14   Hospital Course:  74 year old divorced Caucasian female mother of 4 children, grandmother of 9 grandchildren referred by Dr. Paulla Dolly, her podiatrist, for evaluation of peripheral vascular disease. Her primary care physician is Dr. [name]. She is retired from working at [employer] Her cardiac risk factor profile is notable for treated hypertension and diabetes. She does have hyperlipidemia but is statin intolerant. 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 She's never had a heart attack. She denies chest pain or shortness of breath. There is no family history of heart disease. She's noticed bilateral lower extremity lifestyle limiting claudication over the last several months. Recent lower extremity arterial Doppler studies performed 06/19/14 revealed ABIs in the mid 0.5 range bilaterally with occluded SFAs. She was admitted 07/06/14 for lower extremity angiography and percutaneous intervention for lifestyle limiting claudication. She has residual disease on the Lt SFA and will need staged intervention. She'll have OP dopplers in one week and follow up with Dr Gwenlyn Found, or an APP when Dr Gwenlyn Found is in the office in 3 weeks.   Discharge Vitals:  Blood pressure 139/43, pulse 87, temperature 97.6 F (36.4 C), temperature  source Oral, resp. rate 18, height 5\' 5"  (1.651 m), weight 143 lb 1.3 oz (64.9 kg), SpO2 97 %.  Extrem: Lt groin without hematoma CV: RRR Chest: clear   Labs: Results for orders placed or performed during the hospital encounter of 07/06/14 (from the past 24 hour(s))  POCT Activated clotting time     Status: None   Collection Time: 07/06/14 11:18 AM  Result Value Ref Range   Activated Clotting Time 208 seconds  POCT Activated clotting time     Status: None   Collection Time: 07/06/14 11:45 AM  Result Value Ref Range   Activated Clotting Time 251 seconds  Glucose, capillary     Status: Abnormal   Collection Time: 07/06/14 12:37 PM  Result Value Ref Range   Glucose-Capillary 206 (H) 70 - 99 mg/dL  POCT Activated clotting time     Status: None   Collection Time: 07/06/14  1:17 PM  Result Value Ref Range   Activated Clotting Time 177 seconds  Glucose, capillary     Status: Abnormal   Collection Time: 07/06/14  7:12 PM  Result Value Ref Range   Glucose-Capillary 246 (H) 70 - 99 mg/dL  Glucose, capillary     Status: Abnormal   Collection Time: 07/06/14  9:39 PM  Result Value Ref Range   Glucose-Capillary 199 (H) 70 - 99 mg/dL   Comment 1 Notify RN   Basic metabolic panel      Status: Abnormal   Collection Time: 07/07/14  2:54 AM  Result Value Ref Range   Sodium 137 135 - 145 mmol/L   Potassium 4.2 3.5 - 5.1 mmol/L   Chloride 102 96 - 112 mmol/L   CO2 27 19 - 32  mmol/L   Glucose, Bld 178 (H) 70 - 99 mg/dL   BUN 12 6 - 23 mg/dL   Creatinine, Ser 1.04 0.50 - 1.10 mg/dL   Calcium 9.3 8.4 - 10.5 mg/dL   GFR calc non Af Amer 53 (L) >90 mL/min   GFR calc Af Amer 61 (L) >90 mL/min   Anion gap 8 5 - 15  CBC     Status: Abnormal   Collection Time: 07/07/14  2:54 AM  Result Value Ref Range   WBC 10.6 (H) 4.0 - 10.5 K/uL   RBC 4.54 3.87 - 5.11 MIL/uL   Hemoglobin 13.1 12.0 - 15.0 g/dL   HCT 40.2 36.0 - 46.0 %   MCV 88.5 78.0 - 100.0 fL   MCH 28.9 26.0 - 34.0 pg   MCHC 32.6 30.0 -  36.0 g/dL   RDW 13.4 11.5 - 15.5 %   Platelets 322 150 - 400 K/uL  Glucose, capillary     Status: Abnormal   Collection Time: 07/07/14  6:28 AM  Result Value Ref Range   Glucose-Capillary 189 (H) 70 - 99 mg/dL    Disposition:    Discharge Medications:    Medication List    ASK your doctor about these medications        alendronate 10 MG tablet  Commonly known as:  FOSAMAX  Take 10 mg by mouth once a week. Take with a full glass of water on an empty stomach.     aspirin 81 MG tablet  Take 81 mg by mouth daily.     glimepiride 4 MG tablet  Commonly known as:  AMARYL  Take 4 mg by mouth daily with breakfast.     levothyroxine 75 MCG tablet  Commonly known as:  SYNTHROID, LEVOTHROID  Take 75 mcg by mouth daily before breakfast.     losartan 50 MG tablet  Commonly known as:  COZAAR  Take 50 mg by mouth daily.     metFORMIN 1000 MG tablet  Commonly known as:  GLUCOPHAGE  Take 1,000 mg by mouth 2 (two) times daily with a meal.     pantoprazole 40 MG tablet  Commonly known as:  PROTONIX  Take 1 tablet by mouth daily.     sitaGLIPtin 100 MG tablet  Commonly known as:  JANUVIA  Take 100 mg by mouth daily.         Duration of Discharge Encounter: Greater than 30 minutes including physician time.  Angelena Form PA-C 07/07/2014 8:04 AM

## 2014-07-10 ENCOUNTER — Other Ambulatory Visit: Payer: Self-pay | Admitting: Cardiovascular Disease

## 2014-07-10 DIAGNOSIS — I739 Peripheral vascular disease, unspecified: Secondary | ICD-10-CM

## 2014-07-12 ENCOUNTER — Ambulatory Visit: Payer: Medicare HMO | Admitting: Podiatry

## 2014-07-13 ENCOUNTER — Ambulatory Visit (HOSPITAL_COMMUNITY)
Admission: RE | Admit: 2014-07-13 | Discharge: 2014-07-13 | Disposition: A | Discharge status: Home / Self Care | Payer: Medicare HMO | Source: Ambulatory Visit | Attending: Cardiology | Admitting: Cardiology

## 2014-07-13 DIAGNOSIS — Z48812 Encounter for surgical aftercare following surgery on the circulatory system: Secondary | ICD-10-CM | POA: Insufficient documentation

## 2014-07-13 DIAGNOSIS — Z9862 Peripheral vascular angioplasty status: Secondary | ICD-10-CM | POA: Diagnosis not present

## 2014-07-13 DIAGNOSIS — I739 Peripheral vascular disease, unspecified: Secondary | ICD-10-CM | POA: Insufficient documentation

## 2014-08-02 ENCOUNTER — Encounter: Payer: Self-pay | Admitting: Cardiology

## 2014-08-02 ENCOUNTER — Ambulatory Visit (INDEPENDENT_AMBULATORY_CARE_PROVIDER_SITE_OTHER): Payer: Medicare HMO | Admitting: Cardiology

## 2014-08-02 VITALS — BP 116/60 | HR 96 | Ht 65.0 in | Wt 142.7 lb

## 2014-08-02 DIAGNOSIS — I10 Essential (primary) hypertension: Secondary | ICD-10-CM

## 2014-08-02 DIAGNOSIS — I739 Peripheral vascular disease, unspecified: Secondary | ICD-10-CM

## 2014-08-02 DIAGNOSIS — D689 Coagulation defect, unspecified: Secondary | ICD-10-CM

## 2014-08-02 DIAGNOSIS — Z01818 Encounter for other preprocedural examination: Secondary | ICD-10-CM

## 2014-08-02 DIAGNOSIS — R0602 Shortness of breath: Secondary | ICD-10-CM

## 2014-08-02 MED ORDER — CLOPIDOGREL BISULFATE 75 MG PO TABS: 75.0000 mg | ORAL_TABLET | Freq: Every day | ORAL | Status: DC

## 2014-08-02 NOTE — Patient Instructions (Addendum)
Your physician has requested that you have a peripheral vascular angiogram. This exam is performed at the hospital. During this exam IV contrast is used to look at arterial blood flow. Please review the information sheet given for details.  Scott from [employer].  Your physician recommends that you return for lab work

## 2014-08-02 NOTE — Progress Notes (Signed)
Cardiology Office Note   Date:  08/02/2014   ID:  Jessica, Torres [birth year], MRN [id number]  PCP:  [name], DO  Cardiologist:  [facility]    Chief Complaint  Patient presents with  . PAD    Needs to have left leg done by Dr. Gwenlyn Found.  Thought she was seeing him today for this to be scheduled.  No complaints of chest pain or SOB.        History of Present Illness: Jessica Torres is a 74 y.o. female who presents for post PV procedure.    Pt had been referred to Dr. Gwenlyn Found by Dr. Paulla Dolly, her podiatrist, for evaluation of peripheral vascular disease. Her cardiac risk factor profile is notable for treated hypertension and diabetes. She does have hyperlipidemia but is statin intolerant. 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 She's never had a heart attack. She denies chest pain.  Has had occ shortness of breath now.  She's noticed bilateral lower extremity lifestyle limiting claudication over the last several months. Recent lower extremity arterial Doppler studies performed 06/19/14 revealed ABIs in the mid 0.5 range bilaterally with occluded SFAs. She was admitted 07/06/14 for lower extremity angiography and percutaneous intervention for lifestyle limiting claudication. She has residual disease on the Lt SFA and will need staged intervention.   Her follow up dopplers saw improvement in Rt ABI to 1.1.  She feels so much better in Rt. Leg.  Her lt leg continues with claudication, can only walk about half a block without stopping.  She has had some Rt knee pain but no warmth or swelling at the knee.    She is ready to have Lt leg with PCI.   Past Medical History  Diagnosis Date  . Peripheral arterial disease   . Hypertension   . Hyperlipidemia     statin intolerant  . Type 2 diabetes mellitus   . PAD (peripheral artery disease)   . GERD (gastroesophageal reflux disease)   . Hypothyroidism   . Sleep apnea     "dx'd years ago; had severe problems; all of the  sudden it just went away" (07/06/2014)  . Stroke ~ 2010    "they said I had several mini strokes then I had this big one; denies residual on 07/06/2014  . Arthritis     "fingers probably" (07/06/2014)  . Chronic lower back pain   . Bulging lumbar disc   . Urgency of urination   . Difficulty urinating   . Uterine cancer     Past Surgical History  Procedure Laterality Date  . Balloon angioplasty, artery Right     SFA/notes 07/06/2014  . Carpal tunnel release Right 1980's  . Hemorrhoid surgery      "dr did it at work; in dr's office"  . Breast surgery    . Incision and drainage breast abscess Left   . Vaginal hysterectomy    . Lower extremity angiogram N/A 07/06/2014    Procedure: LOWER EXTREMITY ANGIOGRAM;  Surgeon: Lorretta Harp, MD;  Location: [facility] CATH LAB;  Service: Cardiovascular;  Laterality: N/A;     Current Outpatient Prescriptions  Medication Sig Dispense Refill  . alendronate (FOSAMAX) 10 MG tablet Take 10 mg by mouth once a week. Take with a full glass of water on an empty stomach.    Marland Kitchen aspirin 81 MG tablet Take 81 mg by mouth daily.    . clopidogrel (PLAVIX) 75 MG tablet Take 1 tablet (75 mg  total) by mouth daily with breakfast. 30 tablet 0  . glimepiride (AMARYL) 4 MG tablet Take 4 mg by mouth daily with breakfast.    . levothyroxine (SYNTHROID, LEVOTHROID) 75 MCG tablet Take 75 mcg by mouth daily before breakfast.    . losartan (COZAAR) 50 MG tablet Take 50 mg by mouth daily.    . metFORMIN (GLUCOPHAGE) 1000 MG tablet Take 1 tablet (1,000 mg total) by mouth 2 (two) times daily with a meal.    . pantoprazole (PROTONIX) 40 MG tablet Take 1 tablet by mouth daily.    . sitaGLIPtin (JANUVIA) 100 MG tablet Take 100 mg by mouth daily.     No current facility-administered medications for this visit.    Allergies:   Morphine and related; Statins; and Latex    Social History:  The patient  reports that she has never smoked. She has never used smokeless tobacco. She reports  that she drinks alcohol. She reports that she does not use illicit drugs.   Family History:  The patient's family history includes Arrhythmia in her mother; Diabetes in her sister; Heart attack in her mother; Heart disease in her father and mother.    ROS:  General:no colds or fevers, no weight changes Skin:no rashes or ulcers HEENT:no blurred vision, no congestion CV:see HPI PUL:see HPI GI:no diarrhea constipation or melena, no indigestion GU:no hematuria, no dysuria MS:no joint pain, + claudication lt leg only now, + rt knee pain Neuro:no syncope, no lightheadedness Endo:no diabetes stable, + thyroid disease  Wt Readings from Last 3 Encounters:  08/02/14 142 lb 11.2 oz (64.728 kg)  07/07/14 143 lb 1.3 oz (64.9 kg)  06/28/14 141 lb 11.2 oz (64.275 kg)     PHYSICAL EXAM: VS:  BP 116/60 mmHg  Pulse 96  Ht 5\' 5"  (1.651 m)  Wt 142 lb 11.2 oz (64.728 kg)  BMI 23.75 kg/m2 , BMI Body mass index is 23.75 kg/(m^2). General:Pleasant affect, NAD Skin:Warm and dry, brisk capillary refill HEENT:normocephalic, sclera clear, mucus membranes moist Neck:supple, no JVD, no bruits  Heart:S1S2 RRR without murmur, gallup, rub or click Lungs:clear without rales, rhonchi, or wheezes [id number], non tender, + BS, do not palpate liver spleen or masses Ext:no lower ext edema, 2+ pedal pulses on rt  2+ radial pulses, rt knee with popping with movement , I cannot recreate pain.  No redness or erythema.  Neuro:alert and oriented X 3, MAE, follows commands, + facial symmetry    EKG:  EKG is NOT ordered today.    Recent Labs: 06/29/2014: TSH 3.691 07/07/2014: BUN 12; Creatinine 1.04; Hemoglobin 13.1; Platelets 322; Potassium 4.2; Sodium 137    Lipid Panel No results found for: CHOL, TRIG, HDL, CHOLHDL, VLDL, LDLCALC, LDLDIRECT     Other studies Reviewed: Additional studies/ records that were reviewed today include: dopplers, and PCI note, discharge summary..   ASSESSMENT AND PLAN:  1.   Peripheral arterial disease History of peripheral arterial disease with a > two-month history of lifestyle limiting claudication symmetric bilaterally. Recent Dopplers performed 06/19/14 revealed ABIs in the mid 0.5 range with occluded SFAs bilaterally. Based on this, we decided to proceed with angiography revealing occluded BIL. SFAs.  Dr. Gwenlyn Found proceeded with successful [facility] 1 atherectomy followed by drug-eluting balloon edge plasty of a chronic totally occluded right SFA. The patient does have a chronic totally occluded left SFA which we will need to address in a staged fashion.  Post procedure doppler reveal normal ABI on the rt now of 1.1 and she  is pain free on Rt.  Now with increased pain on Lt.  Dr. Gwenlyn Found spoke with pt and we will arrange PCI of left, in near future.  He will have Scott with [employer] present as well.  She will continue plavix and asa.   2.  Hyperlipidemia History of hyperlipidemia with statin intolerance-plans to begin zetia   3.  Essential hypertension History of hypertension blood pressure measured today at 116/60. She is on losartan 50 mg a day. Continue current meds at current dosing   4. SOB. Minimal episode and without exertion. If continues may need stress test.  + family hx CAD.  Current medicines are reviewed with the patient today.  The patient Has no concerns regarding medicines.  The following changes have been made:  See above Labs/ tests ordered today include:see above  Disposition:   FU:  see above  Signed, Isaiah Serge, NP  08/02/2014 5:13 PM    [facility] [place], [place], [place], [place] Phone: [phone]; Fax: [phone]

## 2014-08-08 ENCOUNTER — Encounter: Payer: Self-pay | Admitting: Cardiovascular Disease

## 2014-08-10 ENCOUNTER — Telehealth: Payer: Self-pay | Admitting: *Deleted

## 2014-08-10 NOTE — Telephone Encounter (Signed)
Called Jessica Torres for her 30 day follow-up SAFE-DCB registry. Jessica Torres reports she has had no adverse events and no target limb re-interventions.

## 2014-08-11 LAB — CBC
HEMATOCRIT: 41 % (ref 36.0–46.0)
Hemoglobin: 13.6 g/dL (ref 12.0–15.0)
MCH: 28.8 pg (ref 26.0–34.0)
MCHC: 33.2 g/dL (ref 30.0–36.0)
MCV: 86.9 fL (ref 78.0–100.0)
MPV: 10.3 fL (ref 8.6–12.4)
Platelets: 372 10*3/uL (ref 150–400)
RBC: 4.72 MIL/uL (ref 3.87–5.11)
RDW: 13.8 % (ref 11.5–15.5)
WBC: 7.9 10*3/uL (ref 4.0–10.5)

## 2014-08-12 LAB — COMPREHENSIVE METABOLIC PANEL
ALBUMIN: 4.2 g/dL (ref 3.5–5.2)
ALT: 16 U/L (ref 0–35)
AST: 19 U/L (ref 0–37)
Alkaline Phosphatase: 69 U/L (ref 39–117)
BILIRUBIN TOTAL: 0.5 mg/dL (ref 0.2–1.2)
BUN: 10 mg/dL (ref 6–23)
CALCIUM: 9.6 mg/dL (ref 8.4–10.5)
CO2: 26 mEq/L (ref 19–32)
Chloride: 101 mEq/L (ref 96–112)
Creat: 0.93 mg/dL (ref 0.50–1.10)
Glucose, Bld: 153 mg/dL — ABNORMAL HIGH (ref 70–99)
Potassium: 4.6 mEq/L (ref 3.5–5.3)
Sodium: 139 mEq/L (ref 135–145)
Total Protein: 7 g/dL (ref 6.0–8.3)

## 2014-08-12 LAB — TSH: TSH: 3.583 u[IU]/mL (ref 0.350–4.500)

## 2014-08-12 LAB — APTT: APTT: 27 s (ref 24–37)

## 2014-08-12 LAB — PROTIME-INR
INR: 0.95 (ref <1.50)
Prothrombin Time: 12.7 seconds (ref 11.6–15.2)

## 2014-08-17 ENCOUNTER — Encounter (HOSPITAL_COMMUNITY)
Admission: RE | Disposition: A | Discharge status: Home / Self Care | Payer: Medicare HMO | Source: Ambulatory Visit | Attending: Cardiovascular Disease

## 2014-08-17 ENCOUNTER — Other Ambulatory Visit: Payer: Self-pay | Admitting: Physician Assistant

## 2014-08-17 ENCOUNTER — Other Ambulatory Visit: Payer: Self-pay | Admitting: Surgery

## 2014-08-17 ENCOUNTER — Encounter (HOSPITAL_COMMUNITY): Payer: Self-pay | Admitting: Cardiovascular Disease

## 2014-08-17 ENCOUNTER — Ambulatory Visit (HOSPITAL_COMMUNITY)
Admission: RE | Admit: 2014-08-17 | Discharge: 2014-08-18 | Disposition: A | Discharge status: Home / Self Care | Payer: Medicare HMO | Source: Ambulatory Visit | Attending: Cardiovascular Disease | Admitting: Cardiovascular Disease

## 2014-08-17 DIAGNOSIS — E1169 Type 2 diabetes mellitus with other specified complication: Secondary | ICD-10-CM | POA: Diagnosis present

## 2014-08-17 DIAGNOSIS — M545 Low back pain: Secondary | ICD-10-CM | POA: Diagnosis not present

## 2014-08-17 DIAGNOSIS — E1165 Type 2 diabetes mellitus with hyperglycemia: Secondary | ICD-10-CM

## 2014-08-17 DIAGNOSIS — Z8673 Personal history of transient ischemic attack (TIA), and cerebral infarction without residual deficits: Secondary | ICD-10-CM | POA: Insufficient documentation

## 2014-08-17 DIAGNOSIS — Z8542 Personal history of malignant neoplasm of other parts of uterus: Secondary | ICD-10-CM | POA: Diagnosis not present

## 2014-08-17 DIAGNOSIS — E785 Hyperlipidemia, unspecified: Secondary | ICD-10-CM | POA: Diagnosis present

## 2014-08-17 DIAGNOSIS — I7092 Chronic total occlusion of artery of the extremities: Secondary | ICD-10-CM | POA: Diagnosis not present

## 2014-08-17 DIAGNOSIS — G8929 Other chronic pain: Secondary | ICD-10-CM | POA: Diagnosis not present

## 2014-08-17 DIAGNOSIS — E039 Hypothyroidism, unspecified: Secondary | ICD-10-CM | POA: Insufficient documentation

## 2014-08-17 DIAGNOSIS — I70212 Atherosclerosis of native arteries of extremities with intermittent claudication, left leg: Secondary | ICD-10-CM | POA: Insufficient documentation

## 2014-08-17 DIAGNOSIS — Z01818 Encounter for other preprocedural examination: Secondary | ICD-10-CM

## 2014-08-17 DIAGNOSIS — Z7902 Long term (current) use of antithrombotics/antiplatelets: Secondary | ICD-10-CM | POA: Insufficient documentation

## 2014-08-17 DIAGNOSIS — I10 Essential (primary) hypertension: Secondary | ICD-10-CM | POA: Insufficient documentation

## 2014-08-17 DIAGNOSIS — Z7982 Long term (current) use of aspirin: Secondary | ICD-10-CM | POA: Diagnosis not present

## 2014-08-17 DIAGNOSIS — Z79899 Other long term (current) drug therapy: Secondary | ICD-10-CM | POA: Diagnosis not present

## 2014-08-17 DIAGNOSIS — E1059 Type 1 diabetes mellitus with other circulatory complications: Secondary | ICD-10-CM | POA: Diagnosis present

## 2014-08-17 DIAGNOSIS — K219 Gastro-esophageal reflux disease without esophagitis: Secondary | ICD-10-CM | POA: Diagnosis not present

## 2014-08-17 DIAGNOSIS — E1159 Type 2 diabetes mellitus with other circulatory complications: Secondary | ICD-10-CM | POA: Diagnosis present

## 2014-08-17 DIAGNOSIS — E119 Type 2 diabetes mellitus without complications: Secondary | ICD-10-CM | POA: Diagnosis present

## 2014-08-17 DIAGNOSIS — G4733 Obstructive sleep apnea (adult) (pediatric): Secondary | ICD-10-CM | POA: Diagnosis not present

## 2014-08-17 DIAGNOSIS — Z7983 Long term (current) use of bisphosphonates: Secondary | ICD-10-CM | POA: Diagnosis not present

## 2014-08-17 DIAGNOSIS — I739 Peripheral vascular disease, unspecified: Secondary | ICD-10-CM

## 2014-08-17 DIAGNOSIS — D689 Coagulation defect, unspecified: Secondary | ICD-10-CM

## 2014-08-17 DIAGNOSIS — E1151 Type 2 diabetes mellitus with diabetic peripheral angiopathy without gangrene: Secondary | ICD-10-CM

## 2014-08-17 DIAGNOSIS — I152 Hypertension secondary to endocrine disorders: Secondary | ICD-10-CM | POA: Diagnosis present

## 2014-08-17 HISTORY — PX: PERIPHERAL VASCULAR CATHETERIZATION: SHX172C

## 2014-08-17 HISTORY — DX: Reserved for inherently not codable concepts without codable children: IMO0001

## 2014-08-17 LAB — CBC
HCT: 37.5 % (ref 36.0–46.0)
Hemoglobin: 12.3 g/dL (ref 12.0–15.0)
MCH: 28.8 pg (ref 26.0–34.0)
MCHC: 32.8 g/dL (ref 30.0–36.0)
MCV: 87.8 fL (ref 78.0–100.0)
Platelets: 307 10*3/uL (ref 150–400)
RBC: 4.27 MIL/uL (ref 3.87–5.11)
RDW: 13.5 % (ref 11.5–15.5)
WBC: 8.8 10*3/uL (ref 4.0–10.5)

## 2014-08-17 LAB — GLUCOSE, CAPILLARY
Glucose-Capillary: 213 mg/dL — ABNORMAL HIGH (ref 65–99)
Glucose-Capillary: 175 mg/dL — ABNORMAL HIGH (ref 65–99)
Glucose-Capillary: 190 mg/dL — ABNORMAL HIGH (ref 65–99)
Glucose-Capillary: 226 mg/dL — ABNORMAL HIGH (ref 65–99)

## 2014-08-17 LAB — POCT ACTIVATED CLOTTING TIME
ACTIVATED CLOTTING TIME: 226 s
ACTIVATED CLOTTING TIME: 171 s
Activated Clotting Time: 220 seconds
Activated Clotting Time: 239 seconds

## 2014-08-17 SURGERY — LOWER EXTREMITY ANGIOGRAPHY
Anesthesia: LOCAL

## 2014-08-17 MED ORDER — HEPARIN (PORCINE) IN NACL 2-0.9 UNIT/ML-% IJ SOLN
INTRAMUSCULAR | Status: AC
  Filled 2014-08-17: qty 1000

## 2014-08-17 MED ORDER — MIDAZOLAM HCL 2 MG/2ML IJ SOLN
INTRAMUSCULAR | Status: DC | PRN
  Administered 2014-08-17: 1 mg via INTRAVENOUS

## 2014-08-17 MED ORDER — SODIUM CHLORIDE 0.9 % IV SOLN
INTRAVENOUS | Status: AC
  Administered 2014-08-17 (×2): via INTRAVENOUS

## 2014-08-17 MED ORDER — LIDOCAINE HCL (PF) 1 % IJ SOLN
INTRAMUSCULAR | Status: AC
  Filled 2014-08-17: qty 30

## 2014-08-17 MED ORDER — HYDROCHLOROTHIAZIDE 12.5 MG PO CAPS
12.5000 mg | ORAL_CAPSULE | Freq: Every day | ORAL | Status: DC
  Administered 2014-08-17 – 2014-08-18 (×2): 12.5 mg via ORAL
  Filled 2014-08-17 (×2): qty 1

## 2014-08-17 MED ORDER — HEPARIN SODIUM (PORCINE) 1000 UNIT/ML IJ SOLN
INTRAMUSCULAR | Status: AC
  Filled 2014-08-17: qty 1

## 2014-08-17 MED ORDER — ASPIRIN EC 325 MG PO TBEC
325.0000 mg | DELAYED_RELEASE_TABLET | Freq: Every day | ORAL | Status: DC
  Administered 2014-08-18: 325 mg via ORAL
  Filled 2014-08-17: qty 1

## 2014-08-17 MED ORDER — LIDOCAINE HCL (PF) 1 % IJ SOLN
INTRAMUSCULAR | Status: DC | PRN
  Administered 2014-08-17: 30 mL via SUBCUTANEOUS

## 2014-08-17 MED ORDER — SODIUM CHLORIDE 0.9 % WEIGHT BASED INFUSION
3.0000 mL/kg/h | INTRAVENOUS | Status: DC
  Administered 2014-08-17: 3 mL/kg/h via INTRAVENOUS

## 2014-08-17 MED ORDER — CLOPIDOGREL BISULFATE 75 MG PO TABS
75.0000 mg | ORAL_TABLET | Freq: Every day | ORAL | Status: DC
  Administered 2014-08-18: 75 mg via ORAL
  Filled 2014-08-17: qty 1

## 2014-08-17 MED ORDER — MIDAZOLAM HCL 2 MG/2ML IJ SOLN
INTRAMUSCULAR | Status: AC
  Filled 2014-08-17: qty 2

## 2014-08-17 MED ORDER — LOSARTAN POTASSIUM 50 MG PO TABS
50.0000 mg | ORAL_TABLET | Freq: Every day | ORAL | Status: DC
  Administered 2014-08-17 – 2014-08-18 (×2): 50 mg via ORAL
  Filled 2014-08-17 (×2): qty 1

## 2014-08-17 MED ORDER — HYDROMORPHONE HCL 1 MG/ML IJ SOLN: 1.0000 mg | INTRAMUSCULAR | Status: DC | PRN

## 2014-08-17 MED ORDER — CLOPIDOGREL BISULFATE 75 MG PO TABS
ORAL_TABLET | ORAL | Status: DC | PRN
  Administered 2014-08-17: 75 mg via ORAL

## 2014-08-17 MED ORDER — IODIXANOL 320 MG/ML IV SOLN
INTRAVENOUS | Status: DC | PRN
  Administered 2014-08-17: 171 mL via INTRA_ARTERIAL

## 2014-08-17 MED ORDER — ASPIRIN 81 MG PO CHEW
81.0000 mg | CHEWABLE_TABLET | ORAL | Status: AC
  Administered 2014-08-17: 81 mg via ORAL

## 2014-08-17 MED ORDER — CLOPIDOGREL BISULFATE 75 MG PO TABS
ORAL_TABLET | ORAL | Status: AC
Start: 2014-08-17 — End: 2014-08-17
  Filled 2014-08-17: qty 1

## 2014-08-17 MED ORDER — SODIUM CHLORIDE 0.9 % WEIGHT BASED INFUSION: 1.0000 mL/kg/h | INTRAVENOUS | Status: DC

## 2014-08-17 MED ORDER — LEVOTHYROXINE SODIUM 75 MCG PO TABS
75.0000 ug | ORAL_TABLET | Freq: Every day | ORAL | Status: DC
  Administered 2014-08-17 – 2014-08-18 (×2): 75 ug via ORAL
  Filled 2014-08-17 (×2): qty 1

## 2014-08-17 MED ORDER — LOSARTAN POTASSIUM-HCTZ 50-12.5 MG PO TABS: 1.0000 | ORAL_TABLET | Freq: Every day | ORAL | Status: DC

## 2014-08-17 MED ORDER — FENTANYL CITRATE (PF) 100 MCG/2ML IJ SOLN
25.0000 ug | Freq: Four times a day (QID) | INTRAMUSCULAR | Status: DC | PRN
  Administered 2014-08-17 (×2): 25 ug via INTRAVENOUS
  Filled 2014-08-17: qty 2

## 2014-08-17 MED ORDER — FENTANYL CITRATE (PF) 100 MCG/2ML IJ SOLN
INTRAMUSCULAR | Status: AC
  Filled 2014-08-17: qty 2

## 2014-08-17 MED ORDER — FENTANYL CITRATE (PF) 100 MCG/2ML IJ SOLN
INTRAMUSCULAR | Status: DC | PRN
  Administered 2014-08-17: 25 ug via INTRAVENOUS

## 2014-08-17 MED ORDER — LINAGLIPTIN 5 MG PO TABS
5.0000 mg | ORAL_TABLET | Freq: Every day | ORAL | Status: DC
  Administered 2014-08-17 – 2014-08-18 (×2): 5 mg via ORAL
  Filled 2014-08-17 (×2): qty 1

## 2014-08-17 MED ORDER — CLOPIDOGREL BISULFATE 75 MG PO TABS: 75.0000 mg | ORAL_TABLET | Freq: Every day | ORAL | Status: DC

## 2014-08-17 MED ORDER — FENTANYL CITRATE (PF) 100 MCG/2ML IJ SOLN
12.5000 ug | Freq: Two times a day (BID) | INTRAMUSCULAR | Status: AC | PRN
  Administered 2014-08-17 (×2): 12.5 ug via INTRAVENOUS

## 2014-08-17 MED ORDER — HYDRALAZINE HCL 20 MG/ML IJ SOLN
10.0000 mg | INTRAMUSCULAR | Status: DC | PRN
  Administered 2014-08-17: 11:00:00 10 mg via INTRAVENOUS
  Filled 2014-08-17: qty 1

## 2014-08-17 MED ORDER — HYDROMORPHONE HCL 1 MG/ML IJ SOLN
1.0000 mg | INTRAMUSCULAR | Status: AC | PRN
  Administered 2014-08-17 – 2014-08-18 (×2): 1 mg via INTRAVENOUS
  Filled 2014-08-17 (×2): qty 1

## 2014-08-17 MED ORDER — ASPIRIN 81 MG PO CHEW
CHEWABLE_TABLET | ORAL | Status: AC
  Administered 2014-08-17: 81 mg via ORAL
  Filled 2014-08-17: qty 1

## 2014-08-17 MED ORDER — SODIUM CHLORIDE 0.9 % IJ SOLN
3.0000 mL | INTRAMUSCULAR | Status: DC | PRN
Start: 2014-08-17 — End: 2014-08-17

## 2014-08-17 MED ORDER — GLIMEPIRIDE 4 MG PO TABS
4.0000 mg | ORAL_TABLET | Freq: Two times a day (BID) | ORAL | Status: DC
  Administered 2014-08-17 – 2014-08-18 (×2): 4 mg via ORAL
  Filled 2014-08-17 (×4): qty 1

## 2014-08-17 MED ORDER — INSULIN ASPART 100 UNIT/ML ~~LOC~~ SOLN
0.0000 [IU] | Freq: Three times a day (TID) | SUBCUTANEOUS | Status: DC
  Administered 2014-08-18: 12:00:00 3 [IU] via SUBCUTANEOUS
  Administered 2014-08-18: 2 [IU] via SUBCUTANEOUS

## 2014-08-17 MED ORDER — HEPARIN SODIUM (PORCINE) 1000 UNIT/ML IJ SOLN
INTRAMUSCULAR | Status: DC | PRN
  Administered 2014-08-17: 2500 [IU] via INTRAVENOUS
  Administered 2014-08-17: 1500 [IU] via INTRAVENOUS
  Administered 2014-08-17: 5000 [IU] via INTRAVENOUS

## 2014-08-17 MED ORDER — PANTOPRAZOLE SODIUM 40 MG PO TBEC
40.0000 mg | DELAYED_RELEASE_TABLET | Freq: Every day | ORAL | Status: DC
  Administered 2014-08-17 – 2014-08-18 (×2): 40 mg via ORAL
  Filled 2014-08-17 (×2): qty 1

## 2014-08-17 MED ORDER — ASPIRIN 81 MG PO TABS: 81.0000 mg | ORAL_TABLET | Freq: Every day | ORAL | Status: DC

## 2014-08-17 MED ORDER — ONDANSETRON HCL 4 MG/2ML IJ SOLN
4.0000 mg | Freq: Four times a day (QID) | INTRAMUSCULAR | Status: DC | PRN
  Administered 2014-08-18: 07:00:00 4 mg via INTRAVENOUS
  Filled 2014-08-17: qty 2

## 2014-08-17 MED ORDER — ACETAMINOPHEN 325 MG PO TABS
650.0000 mg | ORAL_TABLET | ORAL | Status: DC | PRN
  Administered 2014-08-18: 12:00:00 650 mg via ORAL
  Filled 2014-08-17: qty 2

## 2014-08-17 SURGICAL SUPPLY — 21 items
BALLOON POWERFLX PRO 5X150X135 (BALLOONS) ×4 IMPLANT
CATH CROSS OVER TEMPO 5F (CATHETERS) ×4 IMPLANT
CATH HAWKONE LX EXTENDED TIP (CATHETERS) ×4 IMPLANT
CATH VIANCE CROSS STAND 150CM (MICROCATHETER) ×4
CATH VIANCE CROSS STD 150CM (MICROCATHETER) ×3 IMPLANT
DEVICE SPIDERFX EMB PROT 6MM (WIRE) ×4 IMPLANT
HAND CONTROLLER AVANTA (MISCELLANEOUS) ×4 IMPLANT
KIT ENCORE 26 ADVANTAGE (KITS) ×4 IMPLANT
KIT PV (KITS) ×4 IMPLANT
SET AVANTA SINGLE PATIENT (MISCELLANEOUS) ×4 IMPLANT
SHEATH AVANTA HAND CONTROLLER (MISCELLANEOUS) ×4 IMPLANT
SHEATH HIGHFLEX ANSEL 7FR 55CM (SHEATH) ×4 IMPLANT
SHEATH PINNACLE 5F 10CM (SHEATH) ×4 IMPLANT
SHEATH PINNACLE 7F 10CM (SHEATH) ×4 IMPLANT
STENT VIABAHN 6X150X120 (Permanent Stent) ×4 IMPLANT
TAPE RADIOPAQUE TURBO (MISCELLANEOUS) ×4 IMPLANT
TRANSDUCER W/STOPCOCK (MISCELLANEOUS) ×4 IMPLANT
TRAY PV CATH (CUSTOM PROCEDURE TRAY) ×4 IMPLANT
TUBING CIL FLEX 10 FLL-RA (TUBING) ×4 IMPLANT
WIRE HITORQ VERSACORE ST 145CM (WIRE) ×4 IMPLANT
WIRE SPARTACORE .014X300CM (WIRE) ×4 IMPLANT

## 2014-08-17 NOTE — Progress Notes (Signed)
Pt states that she has had some chest pain ache this am. Says that it got to a 3 on the pain scale.  Pt states she is no longer having any pain.  Annie Main notified

## 2014-08-17 NOTE — Interval H&P Note (Signed)
History and Physical Interval Note:  08/17/2014 8:52 AM  Jessica Torres  has presented today for surgery, with the diagnosis of pad  The various methods of treatment have been discussed with the patient and family. After consideration of risks, benefits and other options for treatment, the patient has consented to  Procedure(s): Lower Extremity Angiography (N/A) as a surgical intervention .  The patient's history has been reviewed, patient examined, no change in status, stable for surgery.  I have reviewed the patient's chart and labs.  Questions were answered to the patient's satisfaction.     Quay Burow

## 2014-08-17 NOTE — Progress Notes (Signed)
Site area: right groin  Site Prior to Removal:  Level 0  Pressure Applied For 20 MINUTES    Minutes Beginning at 1255  Manual:   Yes.    Patient Status During Pull:  AAO X 4  Post Pull Groin Site:  Level 0  Post Pull Instructions Given:  Yes.    Post Pull Pulses Present:  Yes.    Dressing Applied:  Yes.    Comments:  TOLERATED PROCEDURE WELL

## 2014-08-17 NOTE — H&P (View-Only) (Signed)
Cardiology Office Note   Date:  08/02/2014   ID:  Jessica Torres, Jessica Torres [birth year], MRN [id number]  PCP:  [name], DO  Cardiologist:  [facility]    Chief Complaint  Patient presents with  . PAD    Needs to have left leg done by Dr. Gwenlyn Found.  Thought she was seeing him today for this to be scheduled.  No complaints of chest pain or SOB.        History of Present Illness: Jessica Torres is a 74 y.o. female who presents for post PV procedure.    Pt had been referred to Dr. Gwenlyn Found by Dr. Paulla Dolly, her podiatrist, for evaluation of peripheral vascular disease. Her cardiac risk factor profile is notable for treated hypertension and diabetes. She does have hyperlipidemia but is statin intolerant. 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 She's never had a heart attack. She denies chest pain.  Has had occ shortness of breath now.  She's noticed bilateral lower extremity lifestyle limiting claudication over the last several months. Recent lower extremity arterial Doppler studies performed 06/19/14 revealed ABIs in the mid 0.5 range bilaterally with occluded SFAs. She was admitted 07/06/14 for lower extremity angiography and percutaneous intervention for lifestyle limiting claudication. She has residual disease on the Lt SFA and will need staged intervention.   Her follow up dopplers saw improvement in Rt ABI to 1.1.  She feels so much better in Rt. Leg.  Her lt leg continues with claudication, can only walk about half a block without stopping.  She has had some Rt knee pain but no warmth or swelling at the knee.    She is ready to have Lt leg with PCI.   Past Medical History  Diagnosis Date  . Peripheral arterial disease   . Hypertension   . Hyperlipidemia     statin intolerant  . Type 2 diabetes mellitus   . PAD (peripheral artery disease)   . GERD (gastroesophageal reflux disease)   . Hypothyroidism   . Sleep apnea     "dx'd years ago; had severe problems; all of the  sudden it just went away" (07/06/2014)  . Stroke ~ 2010    "they said I had several mini strokes then I had this big one; denies residual on 07/06/2014  . Arthritis     "fingers probably" (07/06/2014)  . Chronic lower back pain   . Bulging lumbar disc   . Urgency of urination   . Difficulty urinating   . Uterine cancer     Past Surgical History  Procedure Laterality Date  . Balloon angioplasty, artery Right     SFA/notes 07/06/2014  . Carpal tunnel release Right 1980's  . Hemorrhoid surgery      "dr did it at work; in dr's office"  . Breast surgery    . Incision and drainage breast abscess Left   . Vaginal hysterectomy    . Lower extremity angiogram N/A 07/06/2014    Procedure: LOWER EXTREMITY ANGIOGRAM;  Surgeon: Lorretta Harp, MD;  Location[facility] C[facility] LAB;  Service: Cardiovascular;  Laterality: N/A;     Current Outpatient Prescriptions  Medication Sig Dispense Refill  . alendronate (FOSAMAX) 10 MG tablet Take 10 mg by mouth once a week. Take with a full glass of water on an empty stomach.    Marland Kitchen aspirin 81 MG tablet Take 81 mg by mouth daily.    . clopidogrel (PLAVIX) 75 MG tablet Take 1 tablet (75 mg  total) by mouth daily with breakfast. 30 tablet 0  . glimepiride (AMARYL) 4 MG tablet Take 4 mg by mouth daily with breakfast.    . levothyroxine (SYNTHROID, LEVOTHROID) 75 MCG tablet Take 75 mcg by mouth daily before breakfast.    . losartan (COZAAR) 50 MG tablet Take 50 mg by mouth daily.    . metFORMIN (GLUCOPHAGE) 1000 MG tablet Take 1 tablet (1,000 mg total) by mouth 2 (two) times daily with a meal.    . pantoprazole (PROTONIX) 40 MG tablet Take 1 tablet by mouth daily.    . sitaGLIPtin (JANUVIA) 100 MG tablet Take 100 mg by mouth daily.     No current facility-administered medications for this visit.    Allergies:   Morphine and related; Statins; and Latex    Social History:  The patient  reports that she has never smoked. She has never used smokeless tobacco. She reports  that she drinks alcohol. She reports that she does not use illicit drugs.   Family History:  The patient's family history includes Arrhythmia in her mother; Diabetes in her sister; Heart attack in her mother; Heart disease in her father and mother.    ROS:  General:no colds or fevers, no weight changes Skin:no rashes or ulcers HEENT:no blurred vision, no congestion CV:see HPI PUL:see HPI GI:no diarrhea constipation or melena, no indigestion GU:no hematuria, no dysuria MS:no joint pain, + claudication lt leg only now, + rt knee pain Neuro:no syncope, no lightheadedness Endo:no diabetes stable, + thyroid disease  Wt Readings from Last 3 Encounters:  08/02/14 142 lb 11.2 oz (64.728 kg)  07/07/14 143 lb 1.3 oz (64.9 kg)  06/28/14 141 lb 11.2 oz (64.275 kg)     PHYSICAL EXAM: VS:  BP 116/60 mmHg  Pulse 96  Ht 5\' 5"  (1.651 m)  Wt 142 lb 11.2 oz (64.728 kg)  BMI 23.75 kg/m2 , BMI Body mass index is 23.75 kg/(m^2). General:Pleasant affect, NAD Skin:Warm and dry, brisk capillary refill HEENT:normocephalic, sclera clear, mucus membranes moist Neck:supple, no JVD, no bruits  Heart:S1S2 RRR without murmur, gallup, rub or click Lungs:clear without rales, rhonchi, or wheezes [id number], non tender, + BS, do not palpate liver spleen or masses Ext:no lower ext edema, 2+ pedal pulses on rt  2+ radial pulses, rt knee with popping with movement , I cannot recreate pain.  No redness or erythema.  Neuro:alert and oriented X 3, MAE, follows commands, + facial symmetry    EKG:  EKG is NOT ordered today.    Recent Labs: 06/29/2014: TSH 3.691 07/07/2014: BUN 12; Creatinine 1.04; Hemoglobin 13.1; Platelets 322; Potassium 4.2; Sodium 137    Lipid Panel No results found for: CHOL, TRIG, HDL, CHOLHDL, VLDL, LDLCALC, LDLDIRECT     Other studies Reviewed: Additional studies/ records that were reviewed today include: dopplers, and PCI note, discharge summary..   ASSESSMENT AND PLAN:  1.   Peripheral arterial disease History of peripheral arterial disease with a > two-month history of lifestyle limiting claudication symmetric bilaterally. Recent Dopplers performed 06/19/14 revealed ABIs in the mid 0.5 range with occluded SFAs bilaterally. Based on this, we decided to proceed with angiography revealing occluded BIL. SFAs.  Dr. Gwenlyn Found proceeded with successful [facility] 1 atherectomy followed by drug-eluting balloon edge plasty of a chronic totally occluded right SFA. The patient does have a chronic totally occluded left SFA which we will need to address in a staged fashion.  Post procedure doppler reveal normal ABI on the rt now of 1.1 and she  is pain free on Rt.  Now with increased pain on Lt.  Dr. Gwenlyn Found spoke with pt and we will arrange PCI of left, in near future.  He will have Scott with [employer] present as well.  She will continue plavix and asa.   2.  Hyperlipidemia History of hyperlipidemia with statin intolerance-plans to begin zetia   3.  Essential hypertension History of hypertension blood pressure measured today at 116/60. She is on losartan 50 mg a day. Continue current meds at current dosing   4. SOB. Minimal episode and without exertion. If continues may need stress test.  + family hx CAD.  Current medicines are reviewed with the patient today.  The patient Has no concerns regarding medicines.  The following changes have been made:  See above Labs/ tests ordered today include:see above  Disposition:   FU:  see above  Signed, Isaiah Serge, NP  08/02/2014 5:13 PM    [facility] [place], [place], [place], [place] Phone: [phone]; Fax: [phone]

## 2014-08-17 NOTE — Progress Notes (Signed)
Paged by nursing staff regarding worsening LLE pain. She is s/p L SFA stent and [place] 1 directional atherectomy. On examination, she has 2+ posterior tibial pulse, unable to feel her dorsalis pedis pulse, however there is clearly weak pulse on doppler. Doubt rethrombosis of stent. The procedure of R femoral approach, cath site examined and stable.   Differential diagnosis include reperfusion pain vs nerve pain related to her chronic herniated disc vs vascular injury/internal bleed. Will order CBC, discussed with Rhonda, night coverage PA who will follow on the result. She is hemodynamically stable. I will focus on pain control for now with fentanyl (she has morphine allergy, however tolerated fentanyl w/o significant SE). If pain worsen or CBC shows significant drop in hgb, will order CT of abdomen and pelvis.   Hilbert Corrigan PA Pager: [phone]

## 2014-08-17 NOTE — Progress Notes (Signed)
Pt c/o severe left leg pain , repositioned and medicated with iv fentanyl, claimed with no relief, pedal pulses + doppler. Isaac Laud, [place]  notified and seen patient.

## 2014-08-18 ENCOUNTER — Other Ambulatory Visit: Payer: Self-pay | Admitting: Physician Assistant

## 2014-08-18 DIAGNOSIS — R011 Cardiac murmur, unspecified: Secondary | ICD-10-CM

## 2014-08-18 DIAGNOSIS — I739 Peripheral vascular disease, unspecified: Secondary | ICD-10-CM

## 2014-08-18 DIAGNOSIS — I70212 Atherosclerosis of native arteries of extremities with intermittent claudication, left leg: Secondary | ICD-10-CM | POA: Diagnosis not present

## 2014-08-18 LAB — BASIC METABOLIC PANEL
ANION GAP: 9 (ref 5–15)
BUN: 11 mg/dL (ref 6–20)
CALCIUM: 9 mg/dL (ref 8.9–10.3)
CHLORIDE: 101 mmol/L (ref 101–111)
CO2: 24 mmol/L (ref 22–32)
Creatinine, Ser: 1.16 mg/dL — ABNORMAL HIGH (ref 0.44–1.00)
GFR calc non Af Amer: 46 mL/min — ABNORMAL LOW (ref >60)
GFR, EST AFRICAN AMERICAN: 54 mL/min — AB (ref >60)
Glucose, Bld: 225 mg/dL — ABNORMAL HIGH (ref 65–99)
Potassium: 3.8 mmol/L (ref 3.5–5.1)
Sodium: 134 mmol/L — ABNORMAL LOW (ref 135–145)

## 2014-08-18 LAB — GLUCOSE, CAPILLARY
GLUCOSE-CAPILLARY: 244 mg/dL — AB (ref 65–99)
Glucose-Capillary: 191 mg/dL — ABNORMAL HIGH (ref 65–99)

## 2014-08-18 LAB — CBC
HCT: 37.1 % (ref 36.0–46.0)
Hemoglobin: 12.1 g/dL (ref 12.0–15.0)
MCH: 28.9 pg (ref 26.0–34.0)
MCHC: 32.6 g/dL (ref 30.0–36.0)
MCV: 88.8 fL (ref 78.0–100.0)
PLATELETS: 308 10*3/uL (ref 150–400)
RBC: 4.18 MIL/uL (ref 3.87–5.11)
RDW: 13.5 % (ref 11.5–15.5)
WBC: 9.6 10*3/uL (ref 4.0–10.5)

## 2014-08-18 MED ORDER — ASPIRIN 325 MG PO TBEC: 325.0000 mg | DELAYED_RELEASE_TABLET | Freq: Every day | ORAL | Status: DC

## 2014-08-18 MED FILL — Heparin Sodium (Porcine) 2 Unit/ML in Sodium Chloride 0.9%: INTRAMUSCULAR | Qty: 1000 | Status: AC

## 2014-08-18 NOTE — Progress Notes (Signed)
Patient Name: Jessica Torres Date of Encounter: 08/18/2014  Cardiologist: Dr.Berry   Active Problems:   Peripheral arterial disease - Bilateral SFA CTO   Claudication    SUBJECTIVE  Denies any CP or SOB.   CURRENT MEDS . aspirin EC  325 mg Oral Daily  . clopidogrel  75 mg Oral Q breakfast  . glimepiride  4 mg Oral BID WC  . hydrochlorothiazide  12.5 mg Oral Daily  . insulin aspart  0-9 Units Subcutaneous TID WC  . levothyroxine  75 mcg Oral QAC breakfast  . linagliptin  5 mg Oral Daily  . losartan  50 mg Oral Daily  . pantoprazole  40 mg Oral Daily    OBJECTIVE  Filed Vitals:   08/17/14 1934 08/17/14 2309 08/18/14 0011 08/18/14 0305  BP: 140/38 138/44  119/51  Pulse: 90 91  92  Temp: 98.6 F (37 C) 98.8 F (37.1 C)  97.9 F (36.6 C)  TempSrc: Oral Oral  Oral  Resp: 16 13  13   Height:      Weight:   142 lb 10.2 oz (64.7 kg)   SpO2: 99% 98%  98%    Intake/Output Summary (Last 24 hours) at 08/18/14 0645 Last data filed at 08/18/14 [id number]  Gross per 24 hour  Intake    540 ml  Output   1750 ml  Net  -1210 ml   Filed Weights   08/17/14 0733 08/18/14 0011  Weight: 142 lb (64.411 kg) 142 lb 10.2 oz (64.7 kg)    PHYSICAL EXAM  General: Pleasant, NAD. Neuro: Alert and oriented X 3. Moves all extremities spontaneously. Psych: Normal affect. HEENT:  Normal  Neck: Supple without bruits or JVD. Lungs:  Resp regular and unlabored, CTA. Heart: RRR no s3, s4, or murmurs. Abdomen: Soft, non-tender, non-distended, BS + x 4.  Extremities: No clubbing, cyanosis or edema. DP/PT/Radials 2+ and equal bilaterally.  Accessory Clinical Findings  CBC  Recent Labs  08/17/14 1855 08/18/14 0257  WBC 8.8 9.6  HGB 12.3 12.1  HCT 37.5 37.1  MCV 87.8 88.8  PLT 307 [id number]   Basic Metabolic Panel  Recent Labs  08/18/14 0257  NA 134*  K 3.8  CL 101  CO2 24  GLUCOSE 225*  BUN 11  CREATININE 1.16*  CALCIUM 9.0    TELE NSR with HR 80s overnight, however HR 110s  this morning.     ECG  NSR with HR 70s  Radiology/Studies  No results found.  ASSESSMENT AND PLAN  1. PAD s/p   - recent ABI 0.5 with occluded SFA bilaterally, s/p atherectomy and DES of R SFA 07/06/2014  - staged LE angiography 08/17/2014 [facility] 1 atherectomy with DES to L SFA   - good PT pulse, weak DP pulse on L, however clearly dopplerable and warm to touch.   2. L groin pain  - had significant pain last night, fentanyl did not work, eventually received dilaudid, currently symptom getting better, dopplerable pulse on exam  - DD include her chronic bulging disc vs bleed, however hgb stable x3. Question if need CT of abdomen and pelvis before discharge.   2. HTN 3. HLD 4. DM 5. Hypothyroidism 6. OSA 7. H/o CVA  Signed, Woodward Ku Pager: [phone]  I have personally seen and examined this patient with Almyra Deforest, PA-C.  I agree with the assessment and plan as outlined above. Left leg sore but soft throughout upper and lower leg. Good distal pulses. Right groin access site  ok. Some nausea. Would ambulate and monitor this am. If better after lunch, can d/c home and plan usual f/u with Dr. Gwenlyn Found in [facility] clinic with ABI. Continue ASA and Plavix.   MCALHANY,CHRISTOPHER 08/18/2014 7:19 AM

## 2014-08-18 NOTE — Discharge Instructions (Signed)
No metformin for 48 hrs after contrast dye, you can restart on 08/20/2014  No driving for 24 hours. No lifting over 5 lbs for 1 week. No sexual activity for 1 week. Keep procedure site clean & dry. If you notice increased pain, swelling, bleeding or pus, call/return!  You may shower, but no soaking baths/hot tubs/pools for 1 week.

## 2014-08-18 NOTE — Discharge Summary (Signed)
Discharge Summary   Patient ID: Jessica Torres,  MRN: [id number], DOB/AGE: 72-06-1940 72 y.o.  Admit date: 08/17/2014 Discharge date: 08/18/2014  Primary Care Provider: [name] Primary Cardiologist: Dr. Gwenlyn Found  Discharge Diagnoses Principal Problem:   Claudication Active Problems:   Essential hypertension   Hyperlipidemia-statin intolerant   Type 2 diabetes, uncontrolled, with peripheral circulatory disorder   Peripheral arterial disease - Bilateral SFA CTO   Allergies Allergies  Allergen Reactions  . Morphine And Related Swelling    Facial swelling  . Statins Other (See Comments)    Myalgias   . Latex Rash    Procedures  LE angiography 08/17/2014  Procedures Performed: 1. Placement of spider distal protection device in the above-the-knee popliteal artery on the left side 2. Obtain contralateral access 3. Hawk 1 directional atherectomy left SFA chronic total occlusion 4. Viabahn covered stent left SFA  Final Impression: successful Hawk 1 directional atherectomy followed by Viabahn covered stenting of a long segment mid left SFA chronic total occlusion for lifestyle limiting claudication. The sheath will be removed and pressure held once the ACT is documented to be less than 170. The patient will be hydrated overnight. She will continue her total and type of therapy. She'll be discharged on the morning and we will obtain follow-up lower extremity arterial Doppler studies in the [facility] line office next week. She'll be seen by me back 2-3 weeks thereafter.    Hospital Course  The patient is a 74 year old Caucasian female with PMH of HTN, HLD, and a history of PAD was been followed by Dr. Gwenlyn Found. She had a recent lower extremity arterial doppler study performed on 06/19/2014 revealed ABI in the mid 0.5 range bilaterally with occluded SFA. She underwent lower extremity angiography with percutaneous intervention and stent  placement for right SFA occlusion on 07/06/2014. She presented to [facility] for outpatient staged lower extremity angiography on 08/17/2014 for treatment of occluded left SFA.  He underwent successful 1 atherectomy with DES placement to the last SFA. Post cath, she did have significant left lower extremity pain, however on examination, she had good distal PT pulse, although she does have weak DP pulse on examination, however there is clearly dopplerable pulse and the lower extremity is warm to the touch. Patient was seen on the following morning on 08/18/2014, at which time her left lower extremity pain has improved significantly. She has been ambulating in the morning without debilitating amount of lower extremity discomfort. She is deemed stable for discharge from cardiology perspective. As per recommendation by Dr. Gwenlyn Found, we will obtain left lower extremity arterial doppler in the [facility] clinic in one week. And follow-up with Dr. Gwenlyn Found in 2-3 weeks. Of note, patient does have a systolic murmur near the right upper sternal border on examination. She does not have any prior echocardiogram. She is asymptomatic with no chest pain or shortness breath. I have ordered an outpatient echocardigram on the same day of LE doppler to assess.   Discharge Vitals Blood pressure 124/58, pulse 94, temperature 97.6 F (36.4 C), temperature source Oral, resp. rate 18, height 5\' 5"  (1.651 m), weight 142 lb 10.2 oz (64.7 kg), SpO2 100 %.  Filed Weights   08/17/14 0733 08/18/14 0011  Weight: 142 lb (64.411 kg) 142 lb 10.2 oz (64.7 kg)    Labs  CBC  Recent Labs  08/17/14 1855 08/18/14 0257  WBC 8.8 9.6  HGB 12.3 12.1  HCT 37.5 37.1  MCV 87.8 88.8  PLT 307 308  Basic Metabolic Panel  Recent Labs  08/18/14 0257  NA 134*  K 3.8  CL 101  CO2 24  GLUCOSE 225*  BUN 11  CREATININE 1.16*  CALCIUM 9.0    Disposition  Pt is being discharged home today in good condition.  Follow-up Plans &  Appointments      Follow-up Information    Follow up with Quay Burow, MD On 09/01/2014.   Specialties:  Cardiology, Radiology   Why:  11:00am. Cardiology followup   Contact information:   [address] [place] [address] [phone]       Follow up with [facility] On 08/25/2014.   Specialty:  Cardiology   Why:  2:00pm for Left lower extremity arterial doppler. 3:00pm for echocardiogram to assess heart murmur   Contact information:   [address] [place]      Discharge Medications    Medication List    STOP taking these medications        aspirin 81 MG tablet  Replaced by:  aspirin 325 MG EC tablet      TAKE these medications        alendronate 10 MG tablet  Commonly known as:  FOSAMAX  Take 10 mg by mouth once a week. Monday= Take with a full glass of water on an empty stomach.     aspirin 325 MG EC tablet  Take 1 tablet (325 mg total) by mouth daily.     clopidogrel 75 MG tablet  Commonly known as:  PLAVIX  Take 1 tablet (75 mg total) by mouth daily with breakfast.     glimepiride 4 MG tablet  Commonly known as:  AMARYL  Take 4 mg by mouth 2 (two) times daily.     levothyroxine 75 MCG tablet  Commonly known as:  SYNTHROID, LEVOTHROID  Take 75 mcg by mouth daily before breakfast.     losartan-hydrochlorothiazide 50-12.5 MG per tablet  Commonly known as:  HYZAAR  Take 1 tablet by mouth daily.     metFORMIN 1000 MG tablet  Commonly known as:  GLUCOPHAGE  Take 1 tablet (1,000 mg total) by mouth 2 (two) times daily with a meal.     pantoprazole 40 MG tablet  Commonly known as:  PROTONIX  Take 1 tablet by mouth daily.     sitaGLIPtin 100 MG tablet  Commonly known as:  JANUVIA  Take 100 mg by mouth daily.        Outstanding Labs/Studies  Outpatient LLE arterial doppler Outpatient Echocardiogram  Duration of Discharge Encounter   Greater than 30 minutes  including physician time.  Hilbert Corrigan PA-C Pager: [phone] 08/18/2014, 1:24 PM

## 2014-08-19 LAB — HEMOGLOBIN A1C
HEMOGLOBIN A1C: 7.9 % — AB (ref 4.8–5.6)
Mean Plasma Glucose: 180 mg/dL

## 2014-08-25 ENCOUNTER — Ambulatory Visit (HOSPITAL_BASED_OUTPATIENT_CLINIC_OR_DEPARTMENT_OTHER)
Admit: 2014-08-25 | Discharge: 2014-08-25 | Disposition: A | Discharge status: Home / Self Care | Payer: Medicare HMO | Attending: Cardiology | Admitting: Cardiology

## 2014-08-25 ENCOUNTER — Ambulatory Visit (HOSPITAL_COMMUNITY)
Admission: RE | Admit: 2014-08-25 | Discharge: 2014-08-25 | Disposition: A | Discharge status: Home / Self Care | Payer: Medicare HMO | Source: Ambulatory Visit | Attending: Cardiology | Admitting: Cardiology

## 2014-08-25 DIAGNOSIS — R011 Cardiac murmur, unspecified: Secondary | ICD-10-CM | POA: Insufficient documentation

## 2014-08-25 DIAGNOSIS — I739 Peripheral vascular disease, unspecified: Secondary | ICD-10-CM | POA: Diagnosis not present

## 2014-09-01 ENCOUNTER — Encounter: Payer: Self-pay | Admitting: *Deleted

## 2014-09-01 ENCOUNTER — Ambulatory Visit: Payer: Medicare HMO | Admitting: Cardiovascular Disease

## 2014-09-04 ENCOUNTER — Telehealth: Payer: Self-pay | Admitting: Cardiovascular Disease

## 2014-09-04 NOTE — Telephone Encounter (Signed)
Jessica Torres is calling to get th results of her echo from last Friday (09/01/14) . Please call   Thanks

## 2014-09-04 NOTE — Telephone Encounter (Signed)
Returned call to patient she stated she had a appointment with Dr.Berry this past Friday and had to cancel due to having car trouble.Stated she would like to reschedule.Appointment scheduled with Dr.Berry 09/15/14 at 2:15 pm.

## 2014-09-06 ENCOUNTER — Telehealth: Payer: Self-pay | Admitting: Cardiovascular Disease

## 2014-09-06 NOTE — Telephone Encounter (Signed)
Pt have blockage in both legs. They have been throbbing and hurting. She also have been getting short of breath a lot. She was wondering if this was normal for her legs to be hurting.

## 2014-09-06 NOTE — Telephone Encounter (Signed)
Called patient - discussed issues she has been having - occasional pain in legs, mainly in thigh/above knee. Occurs in both legs.  She had PV cath 3 weeks ago, stent placement to left SFA.  3 episodes of pain since then, these have resolved w/in a day. Most recent was yesterday - denies any pain today. She does not note this pain being any different than that she'd had prior to her PV proc.  She inquired about echo done on 6/19 - advised results normal - she voiced understanding.  Malachy Mood had received call from patient 2 days ago, moved her f/u w/ Dr. Gwenlyn Found up - she comes in on 7/8. Advised to discuss w/ Dr. Gwenlyn Found at appt - she is agreeable to this plan. Advised to call if further/worsening symptoms.   Will route to Dr. Gwenlyn Found for additional recommendations.

## 2014-09-15 ENCOUNTER — Encounter: Payer: Self-pay | Admitting: Cardiovascular Disease

## 2014-09-15 ENCOUNTER — Ambulatory Visit (INDEPENDENT_AMBULATORY_CARE_PROVIDER_SITE_OTHER): Payer: Medicare HMO | Admitting: Cardiovascular Disease

## 2014-09-15 VITALS — BP 141/79 | HR 81 | Ht 65.0 in | Wt 143.1 lb

## 2014-09-15 DIAGNOSIS — E785 Hyperlipidemia, unspecified: Secondary | ICD-10-CM | POA: Diagnosis not present

## 2014-09-15 DIAGNOSIS — Z79899 Other long term (current) drug therapy: Secondary | ICD-10-CM

## 2014-09-15 DIAGNOSIS — R0989 Other specified symptoms and signs involving the circulatory and respiratory systems: Secondary | ICD-10-CM

## 2014-09-15 DIAGNOSIS — I10 Essential (primary) hypertension: Secondary | ICD-10-CM

## 2014-09-15 DIAGNOSIS — R0602 Shortness of breath: Secondary | ICD-10-CM | POA: Diagnosis not present

## 2014-09-15 NOTE — Assessment & Plan Note (Signed)
hhistory of hypertension blood pressure measured at 141/79. She is on losartan, hydrochlorothiazide. Continue current meds at current dosing

## 2014-09-15 NOTE — Assessment & Plan Note (Signed)
This was has been complaining of progressive increasing dyspnea on exertion. A 2-D echo is essentially normal. I'm going to get a follow carotid Myoview stress test to rule out ischemia as a etiology.

## 2014-09-15 NOTE — Patient Instructions (Signed)
Medication Instructions:   Continue your current medications  Labwork:  A FASTING lipid profile: to be done at your convenience.  There is a Psychologist, forensic lab on the first floor of this building, [address].  They are open from 8am-5pm with a lunch from 12-2.  You do not need an appointment.    Testing/Procedures:  Carotid Duplex- This test is an ultrasound of the carotid arteries in your neck. It looks at blood flow through these arteries that supply the brain with blood. Allow one hour for this exam. There are no restrictions or special instructions.  Lexiscan Myoview- this is a test that looks at the blood flow to your heart muscle.  It takes approximately 2 1/2 hours. Please follow instruction sheet, as given.     Follow-Up:  After the tests with Dr Gwenlyn Found  Any Other Special Instructions Will Be Listed Below (If Applicable).

## 2014-09-15 NOTE — Assessment & Plan Note (Signed)
History of hyperlipidemia intolerant to statin drugs. We will have her evaluated for potential use of a PCSK9  monoclonal injectable

## 2014-09-15 NOTE — Progress Notes (Signed)
09/15/2014 Jessica Torres   [birth year]  [id number]  Primary Physician [name], DO Primary Cardiologist: Lorretta Harp MD Renae Gloss   HPI:  Jessica Torres is a 74 year old female referred to me by Dr. Paulla Dolly, her podiatrist, for evaluation of peripheral vascular disease. I last saw her in the office 06/28/14.Her cardiac risk factor profile is notable for treated hypertension and diabetes. She does have hyperlipidemia but is statin intolerant. 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 She's never had a heart attack. She denies chest pain. Has had occ shortness of breath now. She's noticed bilateral lower extremity lifestyle limiting claudication over the last several months. Recent lower extremity arterial Doppler studies performed 06/19/14 revealed ABIs in the mid 0.5 range bilaterally with occluded SFAs. She was admitted 07/06/14 for lower extremity angiography and percutaneous intervention for lifestyle limiting claudication. I performed directional atherectomy followed by drug-eluting balloon angioplasty.She has residual disease on the Lt SFA .   She had staged  Bilateral lower extremity interventions on 07/06/14 (right SFA directional atherectomy, drug-eluting balloon angioplasty), 08/16/13 (directional atherectomy, stenting using Viabahn cover stent). Her ABIs improved from the 0.5 range bilaterally to normal. Her claudication has resolved as well. She does complain of increasing shortness of breath. 2-D echo was normal   Current Outpatient Prescriptions  Medication Sig Dispense Refill  . alendronate (FOSAMAX) 10 MG tablet Take 10 mg by mouth once a week. Monday= Take with a full glass of water on an empty stomach.    Marland Kitchen aspirin EC 325 MG EC tablet Take 1 tablet (325 mg total) by mouth daily.    Marland Kitchen aspirin EC 81 MG tablet Take 81 mg by mouth daily.    Marland Kitchen glimepiride (AMARYL) 4 MG tablet Take 4 mg by mouth 2 (two) times daily.     Marland Kitchen levothyroxine (SYNTHROID,  LEVOTHROID) 75 MCG tablet Take 75 mcg by mouth daily before breakfast.    . losartan-hydrochlorothiazide (HYZAAR) 50-12.5 MG per tablet Take 1 tablet by mouth daily.    . metFORMIN (GLUCOPHAGE) 1000 MG tablet Take 1 tablet (1,000 mg total) by mouth 2 (two) times daily with a meal.    . pantoprazole (PROTONIX) 40 MG tablet Take 1 tablet by mouth daily.    . sitaGLIPtin (JANUVIA) 100 MG tablet Take 100 mg by mouth daily.    Marland Kitchen alendronate (FOSAMAX) 70 MG tablet Take 1 tablet by mouth once a week.    . clopidogrel (PLAVIX) 75 MG tablet Take 1 tablet by mouth daily.     No current facility-administered medications for this visit.    Allergies  Allergen Reactions  . Morphine And Related Swelling    Facial swelling  . Statins Other (See Comments)    Myalgias   . Latex Rash    History   Social History  . Marital Status: Divorced    Spouse Name: N/A  . Number of Children: N/A  . Years of Education: N/A   Occupational History  . Not on file.   Social History Main Topics  . Smoking status: Never Smoker   . Smokeless tobacco: Never Used  . Alcohol Use: 0.0 oz/week    0 Standard drinks or equivalent per week     Comment: 07/06/2014 "glass of wine once 2 wks or so"  . Drug Use: No  . Sexual Activity: No   Other Topics Concern  . Not on file   Social History Narrative     Review of Systems:  General: negative for chills, fever, night sweats or weight changes.  Cardiovascular: negative for chest pain, dyspnea on exertion, edema, orthopnea, palpitations, paroxysmal nocturnal dyspnea or shortness of breath Dermatological: negative for rash Respiratory: negative for cough or wheezing Urologic: negative for hematuria Abdominal: negative for nausea, vomiting, diarrhea, bright red blood per rectum, melena, or hematemesis Neurologic: negative for visual changes, syncope, or dizziness All other systems reviewed and are otherwise negative except as noted above.    Blood pressure  141/79, pulse 81, height 5\' 5"  (1.651 m), weight 143 lb 1.6 oz (64.91 kg).  General appearance: alert and no distress Neck: no adenopathy, no carotid bruit, no JVD, supple, symmetrical, trachea midline and thyroid not enlarged, symmetric, no tenderness/mass/nodules Lungs: clear to auscultation bilaterally Heart: regular rate and rhythm, S1, S2 normal, no murmur, click, rub or gallop Extremities: extremities normal, atraumatic, no cyanosis or edema and palpable pedal pulses bilaterally  EKG not performed today  ASSESSMENT AND PLAN:   Peripheral arterial disease - Bilateral SFA CTO History of symptomatically occasional documented peripheral arterial disease status post staged right SFA and a set left SFA intervention on April 28 and June 9. She did have a stent placed in her left SFA. Her follow-up lower extremity arterial Dopplers revealed normalization of her ABIs from the 0.5 range bilaterally up to the 1 range bilaterally. Claudication has resolved. She is on Plavix.  Hyperlipidemia-statin intolerant History of hyperlipidemia intolerant to statin drugs. We will have her evaluated for potential use of a PCSK9  monoclonal injectable  Essential hypertension hhistory of hypertension blood pressure measured at 141/79. She is on losartan, hydrochlorothiazide. Continue current meds at current dosing  Shortness of breath This was has been complaining of progressive increasing dyspnea on exertion. A 2-D echo is essentially normal. I'm going to get a follow carotid Myoview stress test to rule out ischemia as a etiology.      Lorretta Harp MD FACP,FACC,FAHA, [facility] 09/15/2014 2:58 PM

## 2014-09-15 NOTE — Assessment & Plan Note (Signed)
History of symptomatically occasional documented peripheral arterial disease status post staged right SFA and a set left SFA intervention on April 28 and June 9. She did have a stent placed in her left SFA. Her follow-up lower extremity arterial Dopplers revealed normalization of her ABIs from the 0.5 range bilaterally up to the 1 range bilaterally. Claudication has resolved. She is on Plavix.

## 2014-09-19 ENCOUNTER — Other Ambulatory Visit: Payer: Self-pay

## 2014-09-19 DIAGNOSIS — Z9289 Personal history of other medical treatment: Secondary | ICD-10-CM

## 2014-10-11 ENCOUNTER — Inpatient Hospital Stay (HOSPITAL_COMMUNITY): Admission: RE | Admit: 2014-10-11 | Payer: Medicare HMO | Source: Ambulatory Visit

## 2014-10-17 ENCOUNTER — Ambulatory Visit: Payer: Medicare HMO | Admitting: Cardiovascular Disease

## 2014-10-23 ENCOUNTER — Other Ambulatory Visit: Payer: Self-pay

## 2014-10-23 DIAGNOSIS — Z1231 Encounter for screening mammogram for malignant neoplasm of breast: Secondary | ICD-10-CM

## 2014-10-24 ENCOUNTER — Ambulatory Visit
Admission: RE | Admit: 2014-10-24 | Discharge: 2014-10-24 | Disposition: A | Discharge status: Home / Self Care | Payer: Medicare HMO | Source: Ambulatory Visit

## 2014-10-24 DIAGNOSIS — Z1231 Encounter for screening mammogram for malignant neoplasm of breast: Secondary | ICD-10-CM

## 2014-12-19 ENCOUNTER — Encounter: Payer: Self-pay | Admitting: *Deleted

## 2014-12-19 DIAGNOSIS — Z006 Encounter for examination for normal comparison and control in clinical research program: Secondary | ICD-10-CM

## 2014-12-19 NOTE — Progress Notes (Signed)
Called Jessica Torres for 6 month follow-up for the [facility] registry. She states she has been doing well and has had no additional interventions to her target lesion. Patient does have some burning in her legs when she walks which she had prior to her interventions. I encouraged her to notify Dr. Gwenlyn Found of her symptoms and to follow-up with his office.

## 2014-12-27 ENCOUNTER — Telehealth: Payer: Self-pay | Admitting: Cardiovascular Disease

## 2014-12-27 DIAGNOSIS — I739 Peripheral vascular disease, unspecified: Secondary | ICD-10-CM

## 2014-12-27 NOTE — Telephone Encounter (Signed)
Spoke with pt, she reports her legs are worse than they were prior to her intervention. She describes burning in both legs from the knee to the top of the ankles with walking. She also describes numbness in both legs all the time. No rest pain, discoloration or swelling. She is due for repeat dopplers in December. Will forward for dr berry's review and advise

## 2014-12-27 NOTE — Telephone Encounter (Signed)
Jessica Torres is calling because he had procedure on her legs 61mths ago , and she states that they are worse than they were and they are numb. Please call    Thanks

## 2014-12-27 NOTE — Telephone Encounter (Signed)
Spoke with pt, follow up doppler studies scheduled.

## 2014-12-27 NOTE — Telephone Encounter (Signed)
Follow-up lower extremity arterial Doppler studies. I'll see the patient and her already scheduled appointment with the Doppler study show worsening of normality

## 2014-12-29 ENCOUNTER — Encounter (HOSPITAL_COMMUNITY): Payer: Self-pay

## 2014-12-29 ENCOUNTER — Ambulatory Visit (HOSPITAL_COMMUNITY)
Admission: RE | Admit: 2014-12-29 | Discharge: 2014-12-29 | Disposition: A | Discharge status: Home / Self Care | Payer: Medicare HMO | Source: Ambulatory Visit | Attending: Cardiovascular Disease | Admitting: Cardiovascular Disease

## 2014-12-29 ENCOUNTER — Other Ambulatory Visit: Payer: Self-pay | Admitting: Cardiovascular Disease

## 2014-12-29 DIAGNOSIS — E119 Type 2 diabetes mellitus without complications: Secondary | ICD-10-CM | POA: Diagnosis not present

## 2014-12-29 DIAGNOSIS — I739 Peripheral vascular disease, unspecified: Secondary | ICD-10-CM | POA: Diagnosis present

## 2014-12-29 DIAGNOSIS — I10 Essential (primary) hypertension: Secondary | ICD-10-CM | POA: Insufficient documentation

## 2014-12-29 DIAGNOSIS — Z95820 Peripheral vascular angioplasty status with implants and grafts: Secondary | ICD-10-CM | POA: Diagnosis not present

## 2014-12-29 DIAGNOSIS — I70203 Unspecified atherosclerosis of native arteries of extremities, bilateral legs: Secondary | ICD-10-CM | POA: Diagnosis not present

## 2015-01-03 ENCOUNTER — Encounter: Payer: Self-pay | Admitting: Cardiovascular Disease

## 2015-01-03 ENCOUNTER — Ambulatory Visit (INDEPENDENT_AMBULATORY_CARE_PROVIDER_SITE_OTHER): Payer: Medicare HMO | Admitting: Cardiovascular Disease

## 2015-01-03 VITALS — BP 116/68 | HR 88 | Ht 65.0 in | Wt 139.0 lb

## 2015-01-03 DIAGNOSIS — Z01818 Encounter for other preprocedural examination: Secondary | ICD-10-CM

## 2015-01-03 DIAGNOSIS — E785 Hyperlipidemia, unspecified: Secondary | ICD-10-CM | POA: Diagnosis not present

## 2015-01-03 DIAGNOSIS — Z9862 Peripheral vascular angioplasty status: Secondary | ICD-10-CM | POA: Diagnosis not present

## 2015-01-03 DIAGNOSIS — I739 Peripheral vascular disease, unspecified: Secondary | ICD-10-CM | POA: Diagnosis not present

## 2015-01-03 DIAGNOSIS — I10 Essential (primary) hypertension: Secondary | ICD-10-CM

## 2015-01-03 NOTE — Patient Instructions (Signed)
Medication Instructions:  Your physician recommends that you continue on your current medications as directed. Please refer to the Current Medication list given to you today.   Labwork: Your physician recommends that you return for lab work in: Architectural technologist - (Bmet, CBC, PT/INR, PTT) The lab can be found on the FIRST FLOOR of out building in [address]   Testing/Procedures: Dr. Gwenlyn Found has ordered a peripheral angiogram to be done at [facility].  This procedure is going to look at the bloodflow in your lower extremities.  If Dr. Gwenlyn Found is able to open up the arteries, you will have to spend one night in the hospital.  If he is not able to open the arteries, you will be able to go home that same day.    After the procedure, you will not be allowed to drive for 3 days or push, pull, or lift anything greater than 10 lbs for one week.    You will be required to have the following tests prior to the procedure:  1. Blood work-the blood work can be done no more than 7 days prior to the procedure.  It can be done at any [facility] lab.  There is one downstairs on the first floor of this building and one in the [facility] building [phone] [address])      *REPS: Scott  Puncture site Right Groin    Any Other Special Instructions Will Be Listed Below (If Applicable).     If you need a refill on your cardiac medications before your next appointment, please call your pharmacy.

## 2015-01-03 NOTE — Assessment & Plan Note (Signed)
Jessica Torres underwent staged right and left SFA intervention in April and June of this year. She had caught one directional arthrectomy of a chronically occluded mid right SFA followed by drug eluting balloon angioplasty as well as Viabahn covered stenting  of the mid left SFA. Postprocedure her symptoms of claudication resolved however recently she's had recurrent cyst symptoms with Dopplers performed 12/29/14 suggesting recurrent disease bilaterally. We will need to proceed with angiography and potential intervention of both SFAs.

## 2015-01-03 NOTE — Assessment & Plan Note (Signed)
History of hyperlipidemia on Zetia and statin therapy

## 2015-01-03 NOTE — Progress Notes (Signed)
01/03/2015 Jessica Torres   1940/06/12  [id number]  Primary Physician [name], DO Primary Cardiologist: Lorretta Harp MD Renae Gloss   HPI:  Jessica Torres is a 74 year old female referred to me by Dr. Paulla Dolly, her podiatrist, for evaluation of peripheral vascular disease. I last saw her in the office 06/28/14.Her cardiac risk factor profile is notable for treated hypertension and diabetes. She does have hyperlipidemia but is statin intolerant. 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 She's never had a heart attack. She denies chest pain. Has had occ shortness of breath now. She's noticed bilateral lower extremity lifestyle limiting claudication over the last several months. Recent lower extremity arterial Doppler studies performed 06/19/14 revealed ABIs in the mid 0.5 range bilaterally with occluded SFAs. She was admitted 07/06/14 for lower extremity angiography and percutaneous intervention for lifestyle limiting claudication. I performed directional atherectomy followed by drug-eluting balloon angioplasty.She has residual disease on the Lt SFA .   She had staged Bilateral lower extremity interventions on 07/06/14 (right SFA directional atherectomy, drug-eluting balloon angioplasty), 08/16/13 (directional atherectomy, stenting using Viabahn cover stent). Her ABIs improved from the 0.5 range bilaterally to normal. Her claudication has resolved as well. She's had recurrent claudication over the last several weeks to months with recent lower showed a Doppler study performed 12/29/14 revealing recurrent disease in both SFAs.   Current Outpatient Prescriptions  Medication Sig Dispense Refill  . alendronate (FOSAMAX) 10 MG tablet Take 10 mg by mouth once a week. Monday= Take with a full glass of water on an empty stomach.    Marland Kitchen alendronate (FOSAMAX) 70 MG tablet Take 1 tablet by mouth once a week.    Marland Kitchen aspirin EC 325 MG EC tablet Take 1 tablet (325 mg total) by  mouth daily.    Marland Kitchen aspirin EC 81 MG tablet Take 81 mg by mouth daily.    . clopidogrel (PLAVIX) 75 MG tablet Take 1 tablet by mouth daily.    Marland Kitchen ezetimibe (ZETIA) 10 MG tablet Take 10 mg by mouth daily.    Marland Kitchen glimepiride (AMARYL) 4 MG tablet Take 4 mg by mouth 2 (two) times daily.     Marland Kitchen levothyroxine (SYNTHROID, LEVOTHROID) 75 MCG tablet Take 75 mcg by mouth daily before breakfast.    . losartan-hydrochlorothiazide (HYZAAR) 50-12.5 MG per tablet Take 1 tablet by mouth daily.    . metFORMIN (GLUCOPHAGE) 1000 MG tablet Take 1 tablet (1,000 mg total) by mouth 2 (two) times daily with a meal.    . pantoprazole (PROTONIX) 40 MG tablet Take 1 tablet by mouth daily.     No current facility-administered medications for this visit.    Allergies  Allergen Reactions  . Morphine And Related Swelling    Facial swelling  . Statins Other (See Comments)    Myalgias   . Latex Rash    Social History   Social History  . Marital Status: Divorced    Spouse Name: N/A  . Number of Children: N/A  . Years of Education: N/A   Occupational History  . Not on file.   Social History Main Topics  . Smoking status: Never Smoker   . Smokeless tobacco: Never Used  . Alcohol Use: 0.0 oz/week    0 Standard drinks or equivalent per week     Comment: 07/06/2014 "glass of wine once 2 wks or so"  . Drug Use: No  . Sexual Activity: No   Other Topics Concern  . Not  on file   Social History Narrative     Review of Systems: General: negative for chills, fever, night sweats or weight changes.  Cardiovascular: negative for chest pain, dyspnea on exertion, edema, orthopnea, palpitations, paroxysmal nocturnal dyspnea or shortness of breath Dermatological: negative for rash Respiratory: negative for cough or wheezing Urologic: negative for hematuria Abdominal: negative for nausea, vomiting, diarrhea, bright red blood per rectum, melena, or hematemesis Neurologic: negative for visual changes, syncope, or  dizziness All other systems reviewed and are otherwise negative except as noted above.    Blood pressure 116/68, pulse 88, height 5\' 5"  (1.651 m), weight 139 lb (63.05 kg).  General appearance: alert and no distress Neck: no adenopathy, no JVD, supple, symmetrical, trachea midline, thyroid not enlarged, symmetric, no tenderness/mass/nodules and soft right carotid bruit Lungs: clear to auscultation bilaterally Heart: regular rate and rhythm, S1, S2 normal, no murmur, click, rub or gallop Extremities: extremities normal, atraumatic, no cyanosis or edema  EKG not performed today  ASSESSMENT AND PLAN:   Status post peripheral artery angioplasty - Hawk 1 Atherectomy with Drug Eluting Balloon Angioplasty of  chronic totally occluded right SFA.  Jessica Torres underwent staged right and left SFA intervention in April and June of this year. She had caught one directional arthrectomy of a chronically occluded mid right SFA followed by drug eluting balloon angioplasty as well as Viabahn covered stenting  of the mid left SFA. Postprocedure her symptoms of claudication resolved however recently she's had recurrent cyst symptoms with Dopplers performed 12/29/14 suggesting recurrent disease bilaterally. We will need to proceed with angiography and potential intervention of both SFAs.  Hyperlipidemia-statin intolerant History of hyperlipidemia on Zetia and statin therapy  Essential hypertension History of hypertension blood pressure measured at 116/68. She is on losartan and hydrochlorothiazide. Continue current meds at current dosing      Lorretta Harp MD [facility], [facility] 01/03/2015 4:51 PM

## 2015-01-03 NOTE — Assessment & Plan Note (Signed)
History of hypertension blood pressure measured at 116/68. She is on losartan and hydrochlorothiazide. Continue current meds at current dosing

## 2015-01-04 ENCOUNTER — Telehealth: Payer: Self-pay | Admitting: *Deleted

## 2015-01-04 NOTE — Telephone Encounter (Signed)
Spoke with [place] @ 7:34 am regarding procedure for Jessica Torres scheduled for Monday 10./31/16 @ 9:30 am at [facility].  He voiced his understanding.

## 2015-01-05 ENCOUNTER — Telehealth: Payer: Self-pay

## 2015-01-05 ENCOUNTER — Other Ambulatory Visit: Payer: Self-pay

## 2015-01-05 DIAGNOSIS — I739 Peripheral vascular disease, unspecified: Secondary | ICD-10-CM

## 2015-01-05 DIAGNOSIS — Z01818 Encounter for other preprocedural examination: Secondary | ICD-10-CM

## 2015-01-05 LAB — BASIC METABOLIC PANEL
BUN: 15 mg/dL (ref 7–25)
CO2: 24 mmol/L (ref 20–31)
CREATININE: 0.98 mg/dL — AB (ref 0.60–0.93)
Calcium: 9.1 mg/dL (ref 8.6–10.4)
Chloride: 101 mmol/L (ref 98–110)
GLUCOSE: 243 mg/dL — AB (ref 65–99)
POTASSIUM: 4.6 mmol/L (ref 3.5–5.3)
Sodium: 138 mmol/L (ref 135–146)

## 2015-01-05 LAB — CBC WITH DIFFERENTIAL/PLATELET
BASOS PCT: 1 % (ref 0–1)
Basophils Absolute: 0.1 10*3/uL (ref 0.0–0.1)
Eosinophils Absolute: 0.2 10*3/uL (ref 0.0–0.7)
Eosinophils Relative: 3 % (ref 0–5)
HCT: 40.3 % (ref 36.0–46.0)
HEMOGLOBIN: 13 g/dL (ref 12.0–15.0)
Lymphocytes Relative: 26 % (ref 12–46)
Lymphs Abs: 2.1 10*3/uL (ref 0.7–4.0)
MCH: 27.4 pg (ref 26.0–34.0)
MCHC: 32.3 g/dL (ref 30.0–36.0)
MCV: 85 fL (ref 78.0–100.0)
MPV: 10.9 fL (ref 8.6–12.4)
Monocytes Absolute: 0.7 10*3/uL (ref 0.1–1.0)
Monocytes Relative: 9 % (ref 3–12)
NEUTROS ABS: 5 10*3/uL (ref 1.7–7.7)
NEUTROS PCT: 61 % (ref 43–77)
Platelets: 334 10*3/uL (ref 150–400)
RBC: 4.74 MIL/uL (ref 3.87–5.11)
RDW: 14 % (ref 11.5–15.5)
WBC: 8.2 10*3/uL (ref 4.0–10.5)

## 2015-01-05 NOTE — Telephone Encounter (Signed)
Patient walked in office.Stated she saw Dr.Berry 01/03/15 and was told to bring the medication she forgot at her visit.Stated she is taking Vytorin 10/40 mg every other day alternating Zetia 10 mg every other day.

## 2015-01-05 NOTE — Telephone Encounter (Signed)
Will route to Dr. Gwenlyn Found to review.

## 2015-01-06 LAB — PROTIME-INR
INR: 0.91 (ref <1.50)
PROTHROMBIN TIME: 12.4 s (ref 11.6–15.2)

## 2015-01-06 LAB — APTT: aPTT: 28 seconds (ref 24–37)

## 2015-01-11 ENCOUNTER — Ambulatory Visit (HOSPITAL_COMMUNITY)
Admission: RE | Admit: 2015-01-11 | Discharge: 2015-01-11 | Disposition: A | Discharge status: Home / Self Care | Payer: Medicare HMO | Source: Ambulatory Visit | Attending: Cardiovascular Disease | Admitting: Cardiovascular Disease

## 2015-01-11 ENCOUNTER — Encounter (HOSPITAL_COMMUNITY): Payer: Self-pay | Admitting: Cardiovascular Disease

## 2015-01-11 ENCOUNTER — Encounter (HOSPITAL_COMMUNITY)
Admission: RE | Disposition: A | Discharge status: Home / Self Care | Payer: Self-pay | Source: Ambulatory Visit | Attending: Cardiovascular Disease

## 2015-01-11 DIAGNOSIS — Y812 Prosthetic and other implants, materials and accessory general- and plastic-surgery devices associated with adverse incidents: Secondary | ICD-10-CM | POA: Insufficient documentation

## 2015-01-11 DIAGNOSIS — I70213 Atherosclerosis of native arteries of extremities with intermittent claudication, bilateral legs: Secondary | ICD-10-CM | POA: Diagnosis not present

## 2015-01-11 DIAGNOSIS — Z7982 Long term (current) use of aspirin: Secondary | ICD-10-CM | POA: Diagnosis not present

## 2015-01-11 DIAGNOSIS — Z9862 Peripheral vascular angioplasty status: Secondary | ICD-10-CM

## 2015-01-11 DIAGNOSIS — E785 Hyperlipidemia, unspecified: Secondary | ICD-10-CM | POA: Diagnosis not present

## 2015-01-11 DIAGNOSIS — E119 Type 2 diabetes mellitus without complications: Secondary | ICD-10-CM | POA: Insufficient documentation

## 2015-01-11 DIAGNOSIS — T82856A Stenosis of peripheral vascular stent, initial encounter: Secondary | ICD-10-CM | POA: Insufficient documentation

## 2015-01-11 DIAGNOSIS — I739 Peripheral vascular disease, unspecified: Secondary | ICD-10-CM | POA: Diagnosis present

## 2015-01-11 DIAGNOSIS — Z7902 Long term (current) use of antithrombotics/antiplatelets: Secondary | ICD-10-CM | POA: Insufficient documentation

## 2015-01-11 DIAGNOSIS — Z7984 Long term (current) use of oral hypoglycemic drugs: Secondary | ICD-10-CM | POA: Diagnosis not present

## 2015-01-11 DIAGNOSIS — Z8673 Personal history of transient ischemic attack (TIA), and cerebral infarction without residual deficits: Secondary | ICD-10-CM | POA: Diagnosis not present

## 2015-01-11 DIAGNOSIS — I10 Essential (primary) hypertension: Secondary | ICD-10-CM | POA: Diagnosis not present

## 2015-01-11 DIAGNOSIS — Z01818 Encounter for other preprocedural examination: Secondary | ICD-10-CM

## 2015-01-11 HISTORY — PX: PERIPHERAL VASCULAR CATHETERIZATION: SHX172C

## 2015-01-11 LAB — GLUCOSE, CAPILLARY
GLUCOSE-CAPILLARY: 216 mg/dL — AB (ref 65–99)
Glucose-Capillary: 227 mg/dL — ABNORMAL HIGH (ref 65–99)

## 2015-01-11 SURGERY — LOWER EXTREMITY ANGIOGRAPHY
Anesthesia: LOCAL

## 2015-01-11 MED ORDER — MIDAZOLAM HCL 2 MG/2ML IJ SOLN
INTRAMUSCULAR | Status: DC | PRN
  Administered 2015-01-11: 1 mg via INTRAVENOUS

## 2015-01-11 MED ORDER — CLOPIDOGREL BISULFATE 75 MG PO TABS: 75.0000 mg | ORAL_TABLET | Freq: Every day | ORAL | Status: DC

## 2015-01-11 MED ORDER — LIDOCAINE HCL (PF) 1 % IJ SOLN
INTRAMUSCULAR | Status: DC | PRN
  Administered 2015-01-11: 09:00:00

## 2015-01-11 MED ORDER — HEPARIN (PORCINE) IN NACL 2-0.9 UNIT/ML-% IJ SOLN
INTRAMUSCULAR | Status: AC
  Filled 2015-01-11: qty 1000

## 2015-01-11 MED ORDER — SODIUM CHLORIDE 0.9 % IJ SOLN: 3.0000 mL | INTRAMUSCULAR | Status: DC | PRN

## 2015-01-11 MED ORDER — ONDANSETRON HCL 4 MG/2ML IJ SOLN: 4.0000 mg | Freq: Four times a day (QID) | INTRAMUSCULAR | Status: DC | PRN

## 2015-01-11 MED ORDER — ASPIRIN EC 325 MG PO TBEC
325.0000 mg | DELAYED_RELEASE_TABLET | Freq: Every day | ORAL | Status: DC
Start: 2015-01-11 — End: 2015-01-11

## 2015-01-11 MED ORDER — ACETAMINOPHEN 325 MG PO TABS: 650.0000 mg | ORAL_TABLET | ORAL | Status: DC | PRN

## 2015-01-11 MED ORDER — SODIUM CHLORIDE 0.9 % IV SOLN: INTRAVENOUS | Status: AC

## 2015-01-11 MED ORDER — ASPIRIN 81 MG PO CHEW: 81.0000 mg | CHEWABLE_TABLET | ORAL | Status: DC

## 2015-01-11 MED ORDER — IODIXANOL 320 MG/ML IV SOLN
INTRAVENOUS | Status: DC | PRN
  Administered 2015-01-11: 100 mL via INTRA_ARTERIAL

## 2015-01-11 MED ORDER — MIDAZOLAM HCL 2 MG/2ML IJ SOLN
INTRAMUSCULAR | Status: AC
  Filled 2015-01-11: qty 4

## 2015-01-11 MED ORDER — LIDOCAINE HCL (PF) 1 % IJ SOLN
INTRAMUSCULAR | Status: AC
  Filled 2015-01-11: qty 30

## 2015-01-11 MED ORDER — SODIUM CHLORIDE 0.9 % WEIGHT BASED INFUSION
1.0000 mL/kg/h | INTRAVENOUS | Status: DC
  Administered 2015-01-11: 1 mL/kg/h via INTRAVENOUS

## 2015-01-11 MED ORDER — FENTANYL CITRATE (PF) 100 MCG/2ML IJ SOLN
INTRAMUSCULAR | Status: DC | PRN
  Administered 2015-01-11: 25 ug via INTRAVENOUS

## 2015-01-11 MED ORDER — SODIUM CHLORIDE 0.9 % WEIGHT BASED INFUSION
3.0000 mL/kg/h | INTRAVENOUS | Status: AC
  Administered 2015-01-11: 3 mL/kg/h via INTRAVENOUS

## 2015-01-11 MED ORDER — FENTANYL CITRATE (PF) 100 MCG/2ML IJ SOLN
INTRAMUSCULAR | Status: AC
  Filled 2015-01-11: qty 4

## 2015-01-11 SURGICAL SUPPLY — 10 items
CATH ANGIO 5F PIGTAIL 65CM (CATHETERS) ×2 IMPLANT
KIT PV (KITS) ×2 IMPLANT
SHEATH PINNACLE 5F 10CM (SHEATH) ×2 IMPLANT
STOPCOCK MORSE 400PSI 3WAY (MISCELLANEOUS) ×2 IMPLANT
SYR MEDRAD MARK V 150ML (SYRINGE) ×2 IMPLANT
SYRINGE MEDRAD AVANTA MACH 7 (SYRINGE) ×2 IMPLANT
TRANSDUCER W/STOPCOCK (MISCELLANEOUS) ×2 IMPLANT
TRAY PV CATH (CUSTOM PROCEDURE TRAY) ×2 IMPLANT
TUBING CIL FLEX 10 FLL-RA (TUBING) ×2 IMPLANT
WIRE HITORQ VERSACORE ST 145CM (WIRE) ×2 IMPLANT

## 2015-01-11 NOTE — Progress Notes (Signed)
Site area: Right groin a 5 french arterial sheath was removed  Site Prior to Removal:  Level 0  Pressure Applied For  15 MINUTES    Minutes Beginning at 0940a  Manual:   Yes.    Patient Status During Pull:  stable  Post Pull Groin Site:  Level 0  Post Pull Instructions Given:  Yes.    Post Pull Pulses Present:  Yes.    Dressing Applied:  Yes.    Comments:  VS remain stable during sheath pull.

## 2015-01-11 NOTE — H&P (View-Only) (Signed)
01/03/2015 Jessica Torres   1940-12-31  [id number]  Primary Physician [name], DO Primary Cardiologist: Lorretta Harp MD Renae Gloss   HPI:  Jessica Torres is a 74 year old female referred to me by Dr. Paulla Dolly, her podiatrist, for evaluation of peripheral vascular disease. I last saw her in the office 06/28/14.Her cardiac risk factor profile is notable for treated hypertension and diabetes. She does have hyperlipidemia but is statin intolerant. 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 She's never had a heart attack. She denies chest pain. Has had occ shortness of breath now. She's noticed bilateral lower extremity lifestyle limiting claudication over the last several months. Recent lower extremity arterial Doppler studies performed 06/19/14 revealed ABIs in the mid 0.5 range bilaterally with occluded SFAs. She was admitted 07/06/14 for lower extremity angiography and percutaneous intervention for lifestyle limiting claudication. I performed directional atherectomy followed by drug-eluting balloon angioplasty.She has residual disease on the Lt SFA .   She had staged Bilateral lower extremity interventions on 07/06/14 (right SFA directional atherectomy, drug-eluting balloon angioplasty), 08/16/13 (directional atherectomy, stenting using Viabahn cover stent). Her ABIs improved from the 0.5 range bilaterally to normal. Her claudication has resolved as well. She's had recurrent claudication over the last several weeks to months with recent lower showed a Doppler study performed 12/29/14 revealing recurrent disease in both SFAs.   Current Outpatient Prescriptions  Medication Sig Dispense Refill  . alendronate (FOSAMAX) 10 MG tablet Take 10 mg by mouth once a week. Monday= Take with a full glass of water on an empty stomach.    Marland Kitchen alendronate (FOSAMAX) 70 MG tablet Take 1 tablet by mouth once a week.    Marland Kitchen aspirin EC 325 MG EC tablet Take 1 tablet (325 mg total) by  mouth daily.    Marland Kitchen aspirin EC 81 MG tablet Take 81 mg by mouth daily.    . clopidogrel (PLAVIX) 75 MG tablet Take 1 tablet by mouth daily.    Marland Kitchen ezetimibe (ZETIA) 10 MG tablet Take 10 mg by mouth daily.    Marland Kitchen glimepiride (AMARYL) 4 MG tablet Take 4 mg by mouth 2 (two) times daily.     Marland Kitchen levothyroxine (SYNTHROID, LEVOTHROID) 75 MCG tablet Take 75 mcg by mouth daily before breakfast.    . losartan-hydrochlorothiazide (HYZAAR) 50-12.5 MG per tablet Take 1 tablet by mouth daily.    . metFORMIN (GLUCOPHAGE) 1000 MG tablet Take 1 tablet (1,000 mg total) by mouth 2 (two) times daily with a meal.    . pantoprazole (PROTONIX) 40 MG tablet Take 1 tablet by mouth daily.     No current facility-administered medications for this visit.    Allergies  Allergen Reactions  . Morphine And Related Swelling    Facial swelling  . Statins Other (See Comments)    Myalgias   . Latex Rash    Social History   Social History  . Marital Status: Divorced    Spouse Name: N/A  . Number of Children: N/A  . Years of Education: N/A   Occupational History  . Not on file.   Social History Main Topics  . Smoking status: Never Smoker   . Smokeless tobacco: Never Used  . Alcohol Use: 0.0 oz/week    0 Standard drinks or equivalent per week     Comment: 07/06/2014 "glass of wine once 2 wks or so"  . Drug Use: No  . Sexual Activity: No   Other Topics Concern  . Not  on file   Social History Narrative     Review of Systems: General: negative for chills, fever, night sweats or weight changes.  Cardiovascular: negative for chest pain, dyspnea on exertion, edema, orthopnea, palpitations, paroxysmal nocturnal dyspnea or shortness of breath Dermatological: negative for rash Respiratory: negative for cough or wheezing Urologic: negative for hematuria Abdominal: negative for nausea, vomiting, diarrhea, bright red blood per rectum, melena, or hematemesis Neurologic: negative for visual changes, syncope, or  dizziness All other systems reviewed and are otherwise negative except as noted above.    Blood pressure 116/68, pulse 88, height 5\' 5"  (1.651 m), weight 139 lb (63.05 kg).  General appearance: alert and no distress Neck: no adenopathy, no JVD, supple, symmetrical, trachea midline, thyroid not enlarged, symmetric, no tenderness/mass/nodules and soft right carotid bruit Lungs: clear to auscultation bilaterally Heart: regular rate and rhythm, S1, S2 normal, no murmur, click, rub or gallop Extremities: extremities normal, atraumatic, no cyanosis or edema  EKG not performed today  ASSESSMENT AND PLAN:   Status post peripheral artery angioplasty - Hawk 1 Atherectomy with Drug Eluting Balloon Angioplasty of  chronic totally occluded right SFA.  Jessica Torres underwent staged right and left SFA intervention in April and June of this year. She had caught one directional arthrectomy of a chronically occluded mid right SFA followed by drug eluting balloon angioplasty as well as Viabahn covered stenting  of the mid left SFA. Postprocedure her symptoms of claudication resolved however recently she's had recurrent cyst symptoms with Dopplers performed 12/29/14 suggesting recurrent disease bilaterally. We will need to proceed with angiography and potential intervention of both SFAs.  Hyperlipidemia-statin intolerant History of hyperlipidemia on Zetia and statin therapy  Essential hypertension History of hypertension blood pressure measured at 116/68. She is on losartan and hydrochlorothiazide. Continue current meds at current dosing      Lorretta Harp MD [facility], [facility] 01/03/2015 4:51 PM

## 2015-01-11 NOTE — Interval H&P Note (Signed)
History and Physical Interval Note:  01/11/2015 8:52 AM  Jessica Torres  has presented today for surgery, with the diagnosis of pvd  The various methods of treatment have been discussed with the patient and family. After consideration of risks, benefits and other options for treatment, the patient has consented to  Procedure(s): Lower Extremity Angiography (N/A) as a surgical intervention .  The patient's history has been reviewed, patient examined, no change in status, stable for surgery.  I have reviewed the patient's chart and labs.  Questions were answered to the patient's satisfaction.     Quay Burow

## 2015-01-11 NOTE — Discharge Instructions (Signed)
Angiogram, Care After °Refer to this sheet in the next few weeks. These instructions provide you with information about caring for yourself after your procedure. Your health care provider may also give you more specific instructions. Your treatment has been planned according to current medical practices, but problems sometimes occur. Call your health care provider if you have any problems or questions after your procedure. °WHAT TO EXPECT AFTER THE PROCEDURE °After your procedure, it is typical to have the following: °· Bruising at the catheter insertion site that usually fades within 1-2 weeks. °· Blood collecting in the tissue (hematoma) that may be painful to the touch. It should usually decrease in size and tenderness within 1-2 weeks. °HOME CARE INSTRUCTIONS °· Take medicines only as directed by your health care provider. °· You may shower 24-48 hours after the procedure or as directed by your health care provider. Remove the bandage (dressing) and gently wash the site with plain soap and water. Pat the area dry with a clean towel. Do not rub the site, because this may cause bleeding. °· Do not take baths, swim, or use a hot tub until your health care provider approves. °· Check your insertion site every day for redness, swelling, or drainage. °· Do not apply powder or lotion to the site. °· Do not lift over 10 lb (4.5 kg) for 5 days after your procedure or as directed by your health care provider. °· Ask your health care provider when it is okay to: °¨ Return to work or school. °¨ Resume usual physical activities or sports. °¨ Resume sexual activity. °· Do not drive home if you are discharged the same day as the procedure. Have someone else drive you. °· You may drive 24 hours after the procedure unless otherwise instructed by your health care provider. °· Do not operate machinery or power tools for 24 hours after the procedure or as directed by your health care provider. °· If your procedure was done as an  outpatient procedure, which means that you went home the same day as your procedure, a responsible adult should be with you for the first 24 hours after you arrive home. °· Keep all follow-up visits as directed by your health care provider. This is important. °SEEK MEDICAL CARE IF: °· You have a fever. °· You have chills. °· You have increased bleeding from the catheter insertion site. Hold pressure on the site.  CALL 911 °SEEK IMMEDIATE MEDICAL CARE IF: °· You have unusual pain at the catheter insertion site. °· You have redness, warmth, or swelling at the catheter insertion site. °· You have drainage (other than a small amount of blood on the dressing) from the catheter insertion site. °· The catheter insertion site is bleeding, and the bleeding does not stop after 30 minutes of holding steady pressure on the site. °· The area near or just beyond the catheter insertion site becomes pale, cool, tingly, or numb. °  °This information is not intended to replace advice given to you by your health care provider. Make sure you discuss any questions you have with your health care provider. °  °Document Released: 09/12/2004 Document Revised: 03/17/2014 Document Reviewed: 07/28/2012 °[employer]vier Interactive Patient Education ©2016 [employer] ° °

## 2015-01-16 ENCOUNTER — Encounter: Payer: Self-pay | Admitting: Cardiovascular Disease

## 2015-01-19 ENCOUNTER — Ambulatory Visit (INDEPENDENT_AMBULATORY_CARE_PROVIDER_SITE_OTHER): Payer: Medicare HMO | Admitting: Physician Assistant

## 2015-01-19 ENCOUNTER — Ambulatory Visit (HOSPITAL_COMMUNITY)
Admission: RE | Admit: 2015-01-19 | Discharge: 2015-01-19 | Disposition: A | Discharge status: Home / Self Care | Payer: Medicare HMO | Source: Ambulatory Visit | Attending: Cardiovascular Disease | Admitting: Cardiovascular Disease

## 2015-01-19 ENCOUNTER — Other Ambulatory Visit: Payer: Self-pay

## 2015-01-19 ENCOUNTER — Encounter: Payer: Self-pay | Admitting: Physician Assistant

## 2015-01-19 VITALS — BP 132/64 | HR 80 | Ht 65.0 in | Wt 139.0 lb

## 2015-01-19 DIAGNOSIS — E1151 Type 2 diabetes mellitus with diabetic peripheral angiopathy without gangrene: Secondary | ICD-10-CM

## 2015-01-19 DIAGNOSIS — E785 Hyperlipidemia, unspecified: Secondary | ICD-10-CM

## 2015-01-19 DIAGNOSIS — I739 Peripheral vascular disease, unspecified: Secondary | ICD-10-CM

## 2015-01-19 DIAGNOSIS — Z7901 Long term (current) use of anticoagulants: Secondary | ICD-10-CM | POA: Diagnosis not present

## 2015-01-19 DIAGNOSIS — Z9862 Peripheral vascular angioplasty status: Secondary | ICD-10-CM

## 2015-01-19 DIAGNOSIS — IMO0002 Reserved for concepts with insufficient information to code with codable children: Secondary | ICD-10-CM

## 2015-01-19 DIAGNOSIS — Z01818 Encounter for other preprocedural examination: Secondary | ICD-10-CM

## 2015-01-19 DIAGNOSIS — M791 Myalgia, unspecified site: Secondary | ICD-10-CM

## 2015-01-19 DIAGNOSIS — Z539 Procedure and treatment not carried out, unspecified reason: Secondary | ICD-10-CM | POA: Insufficient documentation

## 2015-01-19 DIAGNOSIS — E1165 Type 2 diabetes mellitus with hyperglycemia: Secondary | ICD-10-CM

## 2015-01-19 DIAGNOSIS — Z139 Encounter for screening, unspecified: Secondary | ICD-10-CM | POA: Diagnosis not present

## 2015-01-19 DIAGNOSIS — E1059 Type 1 diabetes mellitus with other circulatory complications: Secondary | ICD-10-CM | POA: Diagnosis present

## 2015-01-19 DIAGNOSIS — I10 Essential (primary) hypertension: Secondary | ICD-10-CM

## 2015-01-19 DIAGNOSIS — E1169 Type 2 diabetes mellitus with other specified complication: Secondary | ICD-10-CM | POA: Diagnosis present

## 2015-01-19 DIAGNOSIS — E119 Type 2 diabetes mellitus without complications: Secondary | ICD-10-CM | POA: Diagnosis present

## 2015-01-19 LAB — CBC
HCT: 39.9 % (ref 36.0–46.0)
Hemoglobin: 13 g/dL (ref 12.0–15.0)
MCH: 28.2 pg (ref 26.0–34.0)
MCHC: 32.6 g/dL (ref 30.0–36.0)
MCV: 86.6 fL (ref 78.0–100.0)
PLATELETS: 298 10*3/uL (ref 150–400)
RBC: 4.61 MIL/uL (ref 3.87–5.11)
RDW: 14.1 % (ref 11.5–15.5)
WBC: 7.8 10*3/uL (ref 4.0–10.5)

## 2015-01-19 LAB — BASIC METABOLIC PANEL
Anion gap: 12 (ref 5–15)
BUN: 12 mg/dL (ref 6–20)
CO2: 22 mmol/L (ref 22–32)
CREATININE: 1.02 mg/dL — AB (ref 0.44–1.00)
Calcium: 9.5 mg/dL (ref 8.9–10.3)
Chloride: 104 mmol/L (ref 101–111)
GFR, EST NON AFRICAN AMERICAN: 54 mL/min — AB (ref >60)
Glucose, Bld: 132 mg/dL — ABNORMAL HIGH (ref 65–99)
POTASSIUM: 3.8 mmol/L (ref 3.5–5.1)
SODIUM: 138 mmol/L (ref 135–145)

## 2015-01-19 LAB — PROTIME-INR
INR: 0.99 (ref 0.00–1.49)
Prothrombin Time: 13.3 seconds (ref 11.6–15.2)

## 2015-01-19 LAB — PLATELET INHIBITION P2Y12: Platelet Function  P2Y12: 219 [PRU] (ref 194–418)

## 2015-01-19 LAB — APTT: aPTT: 27 s (ref 24–37)

## 2015-01-19 LAB — CK: CK TOTAL: 81 U/L (ref 38–234)

## 2015-01-19 NOTE — Progress Notes (Signed)
Cardiology Office Note   Date:  01/19/2015   ID:  SEMAJE ROETHER, DOB 1940/04/17, MRN [id number]  PCP:  [name], DO  Cardiologist:  Dr [name], PA-C   Chief Complaint  Patient presents with  . Appointment    legs hurt. want to know if there are any alternatives other than statins for cholesterol.    History of Present Illness: Jessica Torres is a 74 y.o. female with a history of HTN, DM, HL (statin intolerant), CVA, PAD w/ PV angio 11/03 showing bilateral dz, only the R SFA amenable to PCI. Scheduled for 11/14.  Jessica Torres presents for pre-hospital evaluation  She is very concerned because of her bilateral leg pain. Both legs hurt much at the time, worse when she walks. She wonders if there is a component of aching that is coming from the Vytorin. She is alternating Vytorin with Zetia, as she has had issues with a statin in the past. She's not sure which one. She doesn't remember trying multiple medications.  The pain in her legs is depressing her and frustrating her because of the blockages in the previously placed stents.  Past Medical History  Diagnosis Date  . Peripheral arterial disease ([place])   . Hypertension   . Hyperlipidemia     statin intolerant  . Type 2 diabetes mellitus ([place])   . PAD (peripheral artery disease) ([place])   . GERD (gastroesophageal reflux disease)   . Hypothyroidism   . Sleep apnea     "dx'd years ago; had severe problems; all of the sudden it just went away" (07/06/2014)  . Stroke ([place]) ~ 2010  . Arthritis     "fingers probably" (07/06/2014)  . Chronic lower back pain   . Bulging lumbar disc   . Urgency of urination   . Difficulty urinating   . Uterine cancer ([place])   . Shortness of breath dyspnea     Past Surgical History  Procedure Laterality Date  . Balloon angioplasty, artery Right     SFA/notes 07/06/2014  . Carpal tunnel release Right 1980's  . Hemorrhoid surgery      "dr did it at work; in dr's office"    . Breast surgery    . Incision and drainage breast abscess Left   . Vaginal hysterectomy    . Lower extremity angiogram N/A 07/06/2014    Procedure: LOWER EXTREMITY ANGIOGRAM;  Surgeon: Lorretta Harp, MD;  Location[facility] CATH LAB;  Service: Cardiovascular;  Laterality: N/A;  . Peripheral vascular catheterization N/A 08/17/2014    Procedure: Lower Extremity Angiography;  Surgeon: Lorretta Harp, MD;  Location: [place] CV LAB;  Service: Cardiovascular;  Laterality: N/A;  . Peripheral vascular catheterization Left 08/17/2014    Procedure: Peripheral Vascular Atherectomy;  Surgeon: Lorretta Harp, MD;  Location: [place] CV LAB;  Service: Cardiovascular;  Laterality: Left;  left SFA  . Atherectomy Left 08/17/2014    left sfa   . Peripheral vascular catheterization N/A 01/11/2015    Procedure: Lower Extremity Angiography;  Surgeon: Lorretta Harp, MD;  Location: [place] CV LAB;  Service: Cardiovascular;  Laterality: N/A;    Current Outpatient Prescriptions  Medication Sig Dispense Refill  . alendronate (FOSAMAX) 70 MG tablet Take 70 mg by mouth once a week.     . Alogliptin Benzoate (NESINA) 25 MG TABS Take 25 mg by mouth every morning.     Marland Kitchen aspirin EC 81 MG tablet Take 81 mg by  mouth daily.    . clopidogrel (PLAVIX) 75 MG tablet Take 75 mg by mouth daily.     Marland Kitchen ezetimibe (ZETIA) 10 MG tablet Take 10 mg by mouth every other day. Alternating with Vytorin 90 tablet 3  . ezetimibe-simvastatin (VYTORIN) 10-40 MG tablet Take 1 tablet by mouth every other day. Alternating with Zetia 30 tablet 3  . glimepiride (AMARYL) 4 MG tablet Take 4 mg by mouth 2 (two) times daily.     Marland Kitchen levothyroxine (SYNTHROID, LEVOTHROID) 75 MCG tablet Take 75 mcg by mouth daily before breakfast.    . losartan-hydrochlorothiazide (HYZAAR) 50-12.5 MG per tablet Take 1 tablet by mouth daily.    . metFORMIN (GLUCOPHAGE) 1000 MG tablet Take 1 tablet (1,000 mg total) by mouth 2 (two) times daily with a meal.    .  pantoprazole (PROTONIX) 40 MG tablet Take 40 mg by mouth daily.      No current facility-administered medications for this visit.    Allergies:   Morphine and related; Latex; and Statins    Social History:  The patient  reports that she has never smoked. She has never used smokeless tobacco. She reports that she drinks alcohol. She reports that she does not use illicit drugs.   Family History:  The patient's family history includes Arrhythmia in her mother; Diabetes in her sister; Heart attack in her mother; Heart disease in her father and mother.    ROS:  Please see the history of present illness. All other systems are reviewed and negative.    PHYSICAL EXAM: VS:  BP 132/64 mmHg  Pulse 80  Ht 5\' 5"  (1.651 m)  Wt 139 lb (63.05 kg)  BMI 23.13 kg/m2  SpO2 99% , BMI Body mass index is 23.13 kg/(m^2). GEN: Well nourished, well developed, female in no acute distress HEENT: normal for age  Neck: no JVD, no carotid bruit, no masses Cardiac: RRR; 2/6 murmur, no rubs, or gallops Respiratory:  clear to auscultation bilaterally, normal work of breathing GI: soft, nontender, nondistended, + BS MS: no deformity or atrophy; no edema; distal pulses are 2+ in both upper extremities; they are decreased and barely palpable in both lower extremities  Skin: warm and dry, no rash Neuro:  Strength and sensation are intact Psych: euthymic mood, full affect   EKG:  EKG is not ordered today.  PV Angio: 01/11/2015 1: Abdominal aortogram-the distal abdominal aorta was free of significant disease 2: Left lower extremity-there was a 30% proximal left SFA stenosis. The previously placed Viabahn stent was occluded at its origin with reconstitution just distal to the stent by profunda femoris collaterals. There was a 70% above-the-knee popliteal stenosis, 95% ostial anterior tibial stenosis. 3: Right lower extremity-there was a 40% proximal segmental right SFA stenosis followed by a 95% short segmental mid  right SFA stenosis with 2 vessel runoff. The anterior tibial is occluded IMPRESSION:Jessica Torres unfortunately has had occlusion of her previously placed Viabahn stent which was put in April of this year. Her right SFA has had aggressive restenosis within the previously treated segment. I do not think she has a percutaneous option for long-term patency of her left SFA. The sheath was removed and pressure held. The patient will be hydrated for 4 hours and discharged home. We will arrange for her to have staged right SFA intervention in 2 weeks. She left the lab in stable condition.   Recent Labs: 08/11/2014: ALT 16; TSH 3.583 01/03/2015: BUN 15; Creat 0.98*; Hemoglobin 13.0; Platelets 334; Potassium 4.6;  Sodium 138    Lipid Panel No results found for: CHOL, TRIG, HDL, CHOLHDL, VLDL, LDLCALC, LDLDIRECT   Wt Readings from Last 3 Encounters:  01/19/15 139 lb (63.05 kg)  01/11/15 139 lb (63.05 kg)  01/03/15 139 lb (63.05 kg)     Other studies Reviewed: Additional studies/ records that were reviewed today include: Previous office notes and hospital records.  ASSESSMENT AND PLAN:  1.  PAD: She is scheduled for PCI to her right SFA on 11/14. We will get labs today and write orders. Because she is on Plavix, we will check a P2 Y 12.  2. Hyperlipidemia: She is concerned that some of the pain in her legs is coming from the simvastatin component of the Vytorin. We discussed the situation.  I suggested that if her symptoms do not improve and at least her right leg after the PCI, she can discuss changing the Zetia to daily, and change in the Vytorin to a low potency statin such as Mevacor. We will check a CK.  3. Diabetes: She will hold her metformin on Sunday and Monday. She will hold her other diabetes medications including glimepiride and Nesina on the day of the procedure.  4. Hypertension: Her blood pressure is well controlled so no changes in her BP medications at this time  Current medicines  are reviewed at length with the patient today.  The patient has concerns regarding medicines. Her concerns were addressed  The following changes have been made:  Hold diabetes meds on the day of the procedure  Labs/ tests ordered today include:   Orders Placed This Encounter  Procedures  . CK  . CBC  . APTT  . Platelet inhibition p2y12  . Protime-INR  . Basic metabolic panel     Disposition:   FU with Dr. Gwenlyn Found after the procedure  Signed, Lenoard Aden  01/19/2015 5:34 PM    [address], [place], [place]  [address] Phone: [phone]; Fax: [phone]

## 2015-01-19 NOTE — Patient Instructions (Signed)
Your physician has recommended you make the following change in your medication:   DO NOT TAKE metformin on Sunday and Monday.. DO NOT TAKE  nesina or glipiride the morning of the procedure.  DO NOT EAT OR DRINK AFTER MIDNIGHT THE NIGHT BEFORE THE TEST.  Your physician recommends that you return for lab work today at [facility].

## 2015-01-22 ENCOUNTER — Ambulatory Visit (HOSPITAL_COMMUNITY)
Admission: RE | Admit: 2015-01-22 | Discharge: 2015-01-23 | Disposition: A | Discharge status: Home / Self Care | Payer: Medicare HMO | Source: Ambulatory Visit | Attending: Cardiovascular Disease | Admitting: Cardiovascular Disease

## 2015-01-22 ENCOUNTER — Encounter (HOSPITAL_COMMUNITY): Payer: Self-pay | Admitting: *Deleted

## 2015-01-22 ENCOUNTER — Encounter (HOSPITAL_COMMUNITY)
Admission: RE | Disposition: A | Discharge status: Home / Self Care | Payer: Self-pay | Source: Ambulatory Visit | Attending: Cardiovascular Disease

## 2015-01-22 DIAGNOSIS — E1165 Type 2 diabetes mellitus with hyperglycemia: Secondary | ICD-10-CM | POA: Diagnosis not present

## 2015-01-22 DIAGNOSIS — Z8673 Personal history of transient ischemic attack (TIA), and cerebral infarction without residual deficits: Secondary | ICD-10-CM | POA: Diagnosis not present

## 2015-01-22 DIAGNOSIS — I70213 Atherosclerosis of native arteries of extremities with intermittent claudication, bilateral legs: Secondary | ICD-10-CM | POA: Insufficient documentation

## 2015-01-22 DIAGNOSIS — T82856A Stenosis of peripheral vascular stent, initial encounter: Secondary | ICD-10-CM | POA: Diagnosis not present

## 2015-01-22 DIAGNOSIS — Z7902 Long term (current) use of antithrombotics/antiplatelets: Secondary | ICD-10-CM | POA: Diagnosis not present

## 2015-01-22 DIAGNOSIS — I10 Essential (primary) hypertension: Secondary | ICD-10-CM | POA: Diagnosis not present

## 2015-01-22 DIAGNOSIS — I739 Peripheral vascular disease, unspecified: Secondary | ICD-10-CM

## 2015-01-22 DIAGNOSIS — Z8542 Personal history of malignant neoplasm of other parts of uterus: Secondary | ICD-10-CM | POA: Insufficient documentation

## 2015-01-22 DIAGNOSIS — E1059 Type 1 diabetes mellitus with other circulatory complications: Secondary | ICD-10-CM | POA: Diagnosis present

## 2015-01-22 DIAGNOSIS — Z9862 Peripheral vascular angioplasty status: Secondary | ICD-10-CM

## 2015-01-22 DIAGNOSIS — Z885 Allergy status to narcotic agent status: Secondary | ICD-10-CM | POA: Insufficient documentation

## 2015-01-22 DIAGNOSIS — Z7982 Long term (current) use of aspirin: Secondary | ICD-10-CM | POA: Diagnosis not present

## 2015-01-22 DIAGNOSIS — Y838 Other surgical procedures as the cause of abnormal reaction of the patient, or of later complication, without mention of misadventure at the time of the procedure: Secondary | ICD-10-CM | POA: Diagnosis not present

## 2015-01-22 DIAGNOSIS — I152 Hypertension secondary to endocrine disorders: Secondary | ICD-10-CM | POA: Diagnosis present

## 2015-01-22 DIAGNOSIS — E785 Hyperlipidemia, unspecified: Secondary | ICD-10-CM | POA: Diagnosis not present

## 2015-01-22 DIAGNOSIS — E1159 Type 2 diabetes mellitus with other circulatory complications: Secondary | ICD-10-CM | POA: Diagnosis present

## 2015-01-22 DIAGNOSIS — E1151 Type 2 diabetes mellitus with diabetic peripheral angiopathy without gangrene: Secondary | ICD-10-CM

## 2015-01-22 DIAGNOSIS — Z7984 Long term (current) use of oral hypoglycemic drugs: Secondary | ICD-10-CM | POA: Diagnosis not present

## 2015-01-22 DIAGNOSIS — Z79899 Other long term (current) drug therapy: Secondary | ICD-10-CM | POA: Diagnosis not present

## 2015-01-22 DIAGNOSIS — E039 Hypothyroidism, unspecified: Secondary | ICD-10-CM | POA: Diagnosis not present

## 2015-01-22 DIAGNOSIS — E119 Type 2 diabetes mellitus without complications: Secondary | ICD-10-CM | POA: Diagnosis present

## 2015-01-22 DIAGNOSIS — I70211 Atherosclerosis of native arteries of extremities with intermittent claudication, right leg: Secondary | ICD-10-CM | POA: Diagnosis not present

## 2015-01-22 DIAGNOSIS — E1169 Type 2 diabetes mellitus with other specified complication: Secondary | ICD-10-CM | POA: Diagnosis present

## 2015-01-22 HISTORY — PX: PERIPHERAL VASCULAR CATHETERIZATION: SHX172C

## 2015-01-22 HISTORY — PX: ANGIOPLASTY: SHX39

## 2015-01-22 LAB — POCT ACTIVATED CLOTTING TIME: Activated Clotting Time: 140 seconds

## 2015-01-22 LAB — GLUCOSE, CAPILLARY
GLUCOSE-CAPILLARY: 196 mg/dL — AB (ref 65–99)
GLUCOSE-CAPILLARY: 92 mg/dL (ref 65–99)
Glucose-Capillary: 138 mg/dL — ABNORMAL HIGH (ref 65–99)
Glucose-Capillary: 258 mg/dL — ABNORMAL HIGH (ref 65–99)

## 2015-01-22 SURGERY — PERIPHERAL VASCULAR ATHERECTOMY
Laterality: Right

## 2015-01-22 MED ORDER — SODIUM CHLORIDE 0.9 % WEIGHT BASED INFUSION
3.0000 mL/kg/h | INTRAVENOUS | Status: DC
  Administered 2015-01-22: 3 mL/kg/h via INTRAVENOUS

## 2015-01-22 MED ORDER — SODIUM CHLORIDE 0.9 % IV SOLN: INTRAVENOUS | Status: DC

## 2015-01-22 MED ORDER — IODIXANOL 320 MG/ML IV SOLN
INTRAVENOUS | Status: DC | PRN
  Administered 2015-01-22: 95 mL via INTRAVENOUS

## 2015-01-22 MED ORDER — SODIUM CHLORIDE 0.9 % IJ SOLN: 3.0000 mL | INTRAMUSCULAR | Status: DC | PRN

## 2015-01-22 MED ORDER — SODIUM CHLORIDE 0.9 % IV SOLN
INTRAVENOUS | Status: AC
  Administered 2015-01-22: 15:00:00 via INTRAVENOUS

## 2015-01-22 MED ORDER — HEPARIN SODIUM (PORCINE) 1000 UNIT/ML IJ SOLN
INTRAMUSCULAR | Status: AC
  Filled 2015-01-22: qty 1

## 2015-01-22 MED ORDER — EZETIMIBE-SIMVASTATIN 10-40 MG PO TABS
1.0000 | ORAL_TABLET | ORAL | Status: DC
  Filled 2015-01-22: qty 1

## 2015-01-22 MED ORDER — PANTOPRAZOLE SODIUM 40 MG PO TBEC
40.0000 mg | DELAYED_RELEASE_TABLET | Freq: Every day | ORAL | Status: DC
  Administered 2015-01-23: 10:00:00 40 mg via ORAL
  Filled 2015-01-22: qty 1

## 2015-01-22 MED ORDER — GLIMEPIRIDE 4 MG PO TABS
4.0000 mg | ORAL_TABLET | Freq: Two times a day (BID) | ORAL | Status: DC
  Administered 2015-01-23: 4 mg via ORAL
  Filled 2015-01-22 (×2): qty 1

## 2015-01-22 MED ORDER — LOSARTAN POTASSIUM-HCTZ 50-12.5 MG PO TABS: 1.0000 | ORAL_TABLET | Freq: Every day | ORAL | Status: DC

## 2015-01-22 MED ORDER — HEPARIN (PORCINE) IN NACL 2-0.9 UNIT/ML-% IJ SOLN
INTRAMUSCULAR | Status: AC
  Filled 2015-01-22: qty 1000

## 2015-01-22 MED ORDER — LIDOCAINE HCL (PF) 1 % IJ SOLN
INTRAMUSCULAR | Status: DC | PRN
  Administered 2015-01-22: 24 mL

## 2015-01-22 MED ORDER — INSULIN ASPART 100 UNIT/ML ~~LOC~~ SOLN
0.0000 [IU] | Freq: Every day | SUBCUTANEOUS | Status: DC
  Administered 2015-01-22: 3 [IU] via SUBCUTANEOUS
  Filled 2015-01-22: qty 0.05

## 2015-01-22 MED ORDER — LEVOTHYROXINE SODIUM 75 MCG PO TABS
75.0000 ug | ORAL_TABLET | Freq: Every day | ORAL | Status: DC
  Administered 2015-01-23: 07:00:00 75 ug via ORAL
  Filled 2015-01-22: qty 1

## 2015-01-22 MED ORDER — ONDANSETRON HCL 4 MG/2ML IJ SOLN: 4.0000 mg | Freq: Four times a day (QID) | INTRAMUSCULAR | Status: DC | PRN

## 2015-01-22 MED ORDER — ACETAMINOPHEN 325 MG PO TABS: 650.0000 mg | ORAL_TABLET | ORAL | Status: DC | PRN

## 2015-01-22 MED ORDER — LINAGLIPTIN 5 MG PO TABS
5.0000 mg | ORAL_TABLET | Freq: Every day | ORAL | Status: DC
  Administered 2015-01-23: 5 mg via ORAL
  Filled 2015-01-22 (×2): qty 1

## 2015-01-22 MED ORDER — ALOGLIPTIN BENZOATE 25 MG PO TABS: 25.0000 mg | ORAL_TABLET | ORAL | Status: DC

## 2015-01-22 MED ORDER — LIDOCAINE HCL (PF) 1 % IJ SOLN
INTRAMUSCULAR | Status: AC
  Filled 2015-01-22: qty 30

## 2015-01-22 MED ORDER — LOSARTAN POTASSIUM 50 MG PO TABS
50.0000 mg | ORAL_TABLET | Freq: Every day | ORAL | Status: DC
  Administered 2015-01-23: 10:00:00 50 mg via ORAL
  Filled 2015-01-22: qty 1

## 2015-01-22 MED ORDER — EZETIMIBE 10 MG PO TABS: 10.0000 mg | ORAL_TABLET | ORAL | Status: DC

## 2015-01-22 MED ORDER — ASPIRIN 81 MG PO CHEW: 81.0000 mg | CHEWABLE_TABLET | ORAL | Status: DC

## 2015-01-22 MED ORDER — HYDROCHLOROTHIAZIDE 12.5 MG PO CAPS
12.5000 mg | ORAL_CAPSULE | Freq: Every day | ORAL | Status: DC
  Administered 2015-01-23: 12.5 mg via ORAL
  Filled 2015-01-22: qty 1

## 2015-01-22 MED ORDER — ASPIRIN EC 325 MG PO TBEC
325.0000 mg | DELAYED_RELEASE_TABLET | Freq: Every day | ORAL | Status: DC
  Administered 2015-01-23: 10:00:00 325 mg via ORAL
  Filled 2015-01-22 (×2): qty 1

## 2015-01-22 MED ORDER — CLOPIDOGREL BISULFATE 75 MG PO TABS
75.0000 mg | ORAL_TABLET | Freq: Every day | ORAL | Status: DC
  Administered 2015-01-23: 10:00:00 75 mg via ORAL
  Filled 2015-01-22 (×2): qty 1

## 2015-01-22 MED ORDER — MIDAZOLAM HCL 2 MG/2ML IJ SOLN
INTRAMUSCULAR | Status: DC | PRN
  Administered 2015-01-22: 1 mg via INTRAVENOUS

## 2015-01-22 MED ORDER — INSULIN ASPART 100 UNIT/ML ~~LOC~~ SOLN
0.0000 [IU] | Freq: Three times a day (TID) | SUBCUTANEOUS | Status: DC
  Administered 2015-01-23: 3 [IU] via SUBCUTANEOUS
  Filled 2015-01-22: qty 0.15

## 2015-01-22 MED ORDER — SODIUM CHLORIDE 0.9 % WEIGHT BASED INFUSION: 1.0000 mL/kg/h | INTRAVENOUS | Status: DC

## 2015-01-22 MED ORDER — FENTANYL CITRATE (PF) 100 MCG/2ML IJ SOLN
INTRAMUSCULAR | Status: AC
  Filled 2015-01-22: qty 4

## 2015-01-22 MED ORDER — HYDRALAZINE HCL 20 MG/ML IJ SOLN
10.0000 mg | Freq: Once | INTRAMUSCULAR | Status: AC
  Administered 2015-01-22: 10 mg via INTRAVENOUS
  Filled 2015-01-22: qty 1

## 2015-01-22 MED ORDER — FENTANYL CITRATE (PF) 100 MCG/2ML IJ SOLN
INTRAMUSCULAR | Status: DC | PRN
  Administered 2015-01-22: 25 ug via INTRAVENOUS

## 2015-01-22 MED ORDER — ASPIRIN EC 81 MG PO TBEC: 81.0000 mg | DELAYED_RELEASE_TABLET | Freq: Every day | ORAL | Status: DC

## 2015-01-22 MED ORDER — EZETIMIBE 10 MG PO TABS
10.0000 mg | ORAL_TABLET | ORAL | Status: DC
  Administered 2015-01-22: 10 mg via ORAL
  Filled 2015-01-22: qty 1

## 2015-01-22 MED ORDER — CLOPIDOGREL BISULFATE 75 MG PO TABS: 75.0000 mg | ORAL_TABLET | Freq: Every day | ORAL | Status: DC

## 2015-01-22 MED ORDER — ALENDRONATE SODIUM 70 MG PO TABS: 70.0000 mg | ORAL_TABLET | ORAL | Status: DC

## 2015-01-22 MED ORDER — HEPARIN SODIUM (PORCINE) 1000 UNIT/ML IJ SOLN
INTRAMUSCULAR | Status: DC | PRN
  Administered 2015-01-22: 6000 [IU] via INTRAVENOUS

## 2015-01-22 MED ORDER — MIDAZOLAM HCL 2 MG/2ML IJ SOLN
INTRAMUSCULAR | Status: AC
  Filled 2015-01-22: qty 4

## 2015-01-22 SURGICAL SUPPLY — 17 items
BALLN LUTONIX DCB 5X60X130 (BALLOONS) ×3
BALLOON LUTONIX DCB 5X60X130 (BALLOONS) ×2 IMPLANT
CATH CROSS OVER TEMPO 5F (CATHETERS) ×3 IMPLANT
CATH HAWKONE LS STANDARD TIP (CATHETERS) ×3
CATH HAWKONE LS STD TIP (CATHETERS) ×2 IMPLANT
DEVICE SPIDERFX EMB PROT 6MM (WIRE) ×3 IMPLANT
KIT ENCORE 26 ADVANTAGE (KITS) ×3 IMPLANT
KIT PV (KITS) ×3 IMPLANT
SHEATH HIGHFLEX ANSEL 7FR 55CM (SHEATH) ×3 IMPLANT
SHEATH PINNACLE 5F 10CM (SHEATH) ×3 IMPLANT
SHEATH SHUTTLE 7F 110CM (SHEATH) ×3 IMPLANT
SYRINGE MEDRAD AVANTA MACH 7 (SYRINGE) ×3 IMPLANT
TAPE RADIOPAQUE TURBO (MISCELLANEOUS) ×3 IMPLANT
TRANSDUCER W/STOPCOCK (MISCELLANEOUS) ×3 IMPLANT
TRAY PV CATH (CUSTOM PROCEDURE TRAY) ×3 IMPLANT
WIRE HITORQ VERSACORE ST 145CM (WIRE) ×3 IMPLANT
WIRE SPARTACORE .014X300CM (WIRE) ×3 IMPLANT

## 2015-01-22 NOTE — Progress Notes (Signed)
PHARMACIST - PHYSICIAN COMMUNICATION  CONCERNING: P&T Medication Policy Regarding Oral Bisphosphonates  RECOMMENDATION: Your order for alendronate (Fosamax), ibandronate (Boniva), or risedronate (Actonel) has been discontinued at this time.  If the patient's post-hospital medical condition warrants safe use of this class of drugs, please resume the pre-hospital regimen upon discharge.  DESCRIPTION:  Alendronate (Fosamax), ibandronate (Boniva), and risedronate (Actonel) can cause severe esophageal erosions in patients who are unable to remain upright at least 30 minutes after taking this medication.   Since brief interruptions in therapy are thought to have minimal impact on bone mineral density, the [facility] has established that bisphosphonate orders should be routinely discontinued during hospitalization.   To override this safety policy and permit administration of Boniva, Fosamax, or Actonel in the hospital, prescribers must write "DO NOT HOLD" in the comments section when placing the order for this class of medications.   Labarron Durnin S. Alford Highland, PharmD, [facility] Clinical Staff Pharmacist Pager [phone]

## 2015-01-22 NOTE — Interval H&P Note (Signed)
History and Physical Interval Note:  01/22/2015 1:42 PM  Jessica Torres  has presented today for surgery, with the diagnosis of pvd  The various methods of treatment have been discussed with the patient and family. After consideration of risks, benefits and other options for treatment, the patient has consented to  * No procedures listed * as a surgical intervention .  The patient's history has been reviewed, patient examined, no change in status, stable for surgery.  I have reviewed the patient's chart and labs.  Questions were answered to the patient's satisfaction.     Quay Burow

## 2015-01-22 NOTE — Progress Notes (Signed)
Site area: left groin  Site Prior to Removal:  Level 0  Pressure Applied For 20 MINUTES    Minutes Beginning at 1750  Manual:   Yes.    Patient Status During Pull:  stable  Post Pull Groin Site:  Level 0  Post Pull Instructions Given:  Yes.    Post Pull Pulses Present:  Yes.    Dressing Applied:  Yes.    Comments:

## 2015-01-22 NOTE — H&P (View-Only) (Signed)
Cardiology Office Note   Date:  01/19/2015   ID:  Jessica Torres, DOB 03-24-1940, MRN [id number]  PCP:  Jessica Graves, DO  Cardiologist:  Dr Jessica Gardner, PA-C   Chief Complaint  Patient presents with  . Appointment    legs hurt. want to know if there are any alternatives other than statins for cholesterol.    History of Present Illness: Jessica Torres is a 74 y.o. female with a history of HTN, DM, HL (statin intolerant), CVA, PAD w/ PV angio 11/03 showing bilateral dz, only the R SFA amenable to PCI. Scheduled for 11/14.  Jessica Torres presents for pre-hospital evaluation  She is very concerned because of her bilateral leg pain. Both legs hurt much at the time, worse when she walks. She wonders if there is a component of aching that is coming from the Vytorin. She is alternating Vytorin with Zetia, as she has had issues with a statin in the past. She's not sure which one. She doesn't remember trying multiple medications.  The pain in her legs is depressing her and frustrating her because of the blockages in the previously placed stents.  Past Medical History  Diagnosis Date  . Peripheral arterial disease ([place])   . Hypertension   . Hyperlipidemia     statin intolerant  . Type 2 diabetes mellitus ([place])   . PAD (peripheral artery disease) ([place])   . GERD (gastroesophageal reflux disease)   . Hypothyroidism   . Sleep apnea     "dx'd years ago; had severe problems; all of the sudden it just went away" (07/06/2014)  . Stroke ([place]) ~ 2010  . Arthritis     "fingers probably" (07/06/2014)  . Chronic lower back pain   . Bulging lumbar disc   . Urgency of urination   . Difficulty urinating   . Uterine cancer ([place])   . Shortness of breath dyspnea     Past Surgical History  Procedure Laterality Date  . Balloon angioplasty, artery Right     SFA/notes 07/06/2014  . Carpal tunnel release Right 1980's  . Hemorrhoid surgery      "dr did it at work; in dr's office"    . Breast surgery    . Incision and drainage breast abscess Left   . Vaginal hysterectomy    . Lower extremity angiogram N/A 07/06/2014    Procedure: LOWER EXTREMITY ANGIOGRAM;  Surgeon: Jessica Harp, MD;  Location: [facility] CATH LAB;  Service: Cardiovascular;  Laterality: N/A;  . Peripheral vascular catheterization N/A 08/17/2014    Procedure: Lower Extremity Angiography;  Surgeon: Jessica Harp, MD;  Location: [place] CV LAB;  Service: Cardiovascular;  Laterality: N/A;  . Peripheral vascular catheterization Left 08/17/2014    Procedure: Peripheral Vascular Atherectomy;  Surgeon: Jessica Harp, MD;  Location: [place] CV LAB;  Service: Cardiovascular;  Laterality: Left;  left SFA  . Atherectomy Left 08/17/2014    left sfa   . Peripheral vascular catheterization N/A 01/11/2015    Procedure: Lower Extremity Angiography;  Surgeon: Jessica Harp, MD;  Location: [place] CV LAB;  Service: Cardiovascular;  Laterality: N/A;    Current Outpatient Prescriptions  Medication Sig Dispense Refill  . alendronate (FOSAMAX) 70 MG tablet Take 70 mg by mouth once a week.     . Alogliptin Benzoate (NESINA) 25 MG TABS Take 25 mg by mouth every morning.     Marland Kitchen aspirin EC 81 MG tablet Take 81 mg by  mouth daily.    . clopidogrel (PLAVIX) 75 MG tablet Take 75 mg by mouth daily.     Marland Kitchen ezetimibe (ZETIA) 10 MG tablet Take 10 mg by mouth every other day. Alternating with Vytorin 90 tablet 3  . ezetimibe-simvastatin (VYTORIN) 10-40 MG tablet Take 1 tablet by mouth every other day. Alternating with Zetia 30 tablet 3  . glimepiride (AMARYL) 4 MG tablet Take 4 mg by mouth 2 (two) times daily.     Marland Kitchen levothyroxine (SYNTHROID, LEVOTHROID) 75 MCG tablet Take 75 mcg by mouth daily before breakfast.    . losartan-hydrochlorothiazide (HYZAAR) 50-12.5 MG per tablet Take 1 tablet by mouth daily.    . metFORMIN (GLUCOPHAGE) 1000 MG tablet Take 1 tablet (1,000 mg total) by mouth 2 (two) times daily with a meal.    .  pantoprazole (PROTONIX) 40 MG tablet Take 40 mg by mouth daily.      No current facility-administered medications for this visit.    Allergies:   Morphine and related; Latex; and Statins    Social History:  The patient  reports that she has never smoked. She has never used smokeless tobacco. She reports that she drinks alcohol. She reports that she does not use illicit drugs.   Family History:  The patient's family history includes Arrhythmia in her mother; Diabetes in her sister; Heart attack in her mother; Heart disease in her father and mother.    ROS:  Please see the history of present illness. All other systems are reviewed and negative.    PHYSICAL EXAM: VS:  BP 132/64 mmHg  Pulse 80  Ht 5\' 5"  (1.651 m)  Wt 139 lb (63.05 kg)  BMI 23.13 kg/m2  SpO2 99% , BMI Body mass index is 23.13 kg/(m^2). GEN: Well nourished, well developed, female in no acute distress HEENT: normal for age  Neck: no JVD, no carotid bruit, no masses Cardiac: RRR; 2/6 murmur, no rubs, or gallops Respiratory:  clear to auscultation bilaterally, normal work of breathing GI: soft, nontender, nondistended, + BS MS: no deformity or atrophy; no edema; distal pulses are 2+ in both upper extremities; they are decreased and barely palpable in both lower extremities  Skin: warm and dry, no rash Neuro:  Strength and sensation are intact Psych: euthymic mood, full affect   EKG:  EKG is not ordered today.  PV Angio: 01/11/2015 1: Abdominal aortogram-the distal abdominal aorta was free of significant disease 2: Left lower extremity-there was a 30% proximal left SFA stenosis. The previously placed Viabahn stent was occluded at its origin with reconstitution just distal to the stent by profunda femoris collaterals. There was a 70% above-the-knee popliteal stenosis, 95% ostial anterior tibial stenosis. 3: Right lower extremity-there was a 40% proximal segmental right SFA stenosis followed by a 95% short segmental mid  right SFA stenosis with 2 vessel runoff. The anterior tibial is occluded IMPRESSION:Jessica Torres unfortunately has had occlusion of her previously placed Viabahn stent which was put in April of this year. Her right SFA has had aggressive restenosis within the previously treated segment. I do not think she has a percutaneous option for long-term patency of her left SFA. The sheath was removed and pressure held. The patient will be hydrated for 4 hours and discharged home. We will arrange for her to have staged right SFA intervention in 2 weeks. She left the lab in stable condition.   Recent Labs: 08/11/2014: ALT 16; TSH 3.583 01/03/2015: BUN 15; Creat 0.98*; Hemoglobin 13.0; Platelets 334; Potassium 4.6;  Sodium 138    Lipid Panel No results found for: CHOL, TRIG, HDL, CHOLHDL, VLDL, LDLCALC, LDLDIRECT   Wt Readings from Last 3 Encounters:  01/19/15 139 lb (63.05 kg)  01/11/15 139 lb (63.05 kg)  01/03/15 139 lb (63.05 kg)     Other studies Reviewed: Additional studies/ records that were reviewed today include: Previous office notes and hospital records.  ASSESSMENT AND PLAN:  1.  PAD: She is scheduled for PCI to her right SFA on 11/14. We will get labs today and write orders. Because she is on Plavix, we will check a P2 Y 12.  2. Hyperlipidemia: She is concerned that some of the pain in her legs is coming from the simvastatin component of the Vytorin. We discussed the situation.  I suggested that if her symptoms do not improve and at least her right leg after the PCI, she can discuss changing the Zetia to daily, and change in the Vytorin to a low potency statin such as Mevacor. We will check a CK.  3. Diabetes: She will hold her metformin on Sunday and Monday. She will hold her other diabetes medications including glimepiride and Nesina on the day of the procedure.  4. Hypertension: Her blood pressure is well controlled so no changes in her BP medications at this time  Current medicines  are reviewed at length with the patient today.  The patient has concerns regarding medicines. Her concerns were addressed  The following changes have been made:  Hold diabetes meds on the day of the procedure  Labs/ tests ordered today include:   Orders Placed This Encounter  Procedures  . CK  . CBC  . APTT  . Platelet inhibition p2y12  . Protime-INR  . Basic metabolic panel     Disposition:   FU with Dr. Gwenlyn Found after the procedure  Signed, Lenoard Aden  01/19/2015 5:34 PM    [address], [place], [place]  [address] Phone: [phone]; Fax: [phone]

## 2015-01-23 ENCOUNTER — Other Ambulatory Visit: Payer: Self-pay | Admitting: Cardiology

## 2015-01-23 ENCOUNTER — Encounter (HOSPITAL_COMMUNITY): Payer: Self-pay | Admitting: Cardiovascular Disease

## 2015-01-23 DIAGNOSIS — Z9862 Peripheral vascular angioplasty status: Secondary | ICD-10-CM | POA: Diagnosis not present

## 2015-01-23 DIAGNOSIS — I10 Essential (primary) hypertension: Secondary | ICD-10-CM | POA: Diagnosis not present

## 2015-01-23 DIAGNOSIS — I739 Peripheral vascular disease, unspecified: Secondary | ICD-10-CM

## 2015-01-23 DIAGNOSIS — T82856A Stenosis of peripheral vascular stent, initial encounter: Secondary | ICD-10-CM | POA: Diagnosis not present

## 2015-01-23 LAB — CBC
HEMATOCRIT: 38 % (ref 36.0–46.0)
Hemoglobin: 12.5 g/dL (ref 12.0–15.0)
MCH: 28.5 pg (ref 26.0–34.0)
MCHC: 32.9 g/dL (ref 30.0–36.0)
MCV: 86.8 fL (ref 78.0–100.0)
Platelets: 281 10*3/uL (ref 150–400)
RBC: 4.38 MIL/uL (ref 3.87–5.11)
RDW: 14.2 % (ref 11.5–15.5)
WBC: 8.8 10*3/uL (ref 4.0–10.5)

## 2015-01-23 LAB — BASIC METABOLIC PANEL
Anion gap: 7 (ref 5–15)
BUN: 11 mg/dL (ref 6–20)
CHLORIDE: 104 mmol/L (ref 101–111)
CO2: 25 mmol/L (ref 22–32)
CREATININE: 1.02 mg/dL — AB (ref 0.44–1.00)
Calcium: 8.9 mg/dL (ref 8.9–10.3)
GFR calc Af Amer: >60 mL/min (ref >60)
GFR calc non Af Amer: 54 mL/min — ABNORMAL LOW (ref >60)
GLUCOSE: 198 mg/dL — AB (ref 65–99)
POTASSIUM: 4.3 mmol/L (ref 3.5–5.1)
SODIUM: 136 mmol/L (ref 135–145)

## 2015-01-23 LAB — GLUCOSE, CAPILLARY: GLUCOSE-CAPILLARY: 184 mg/dL — AB (ref 65–99)

## 2015-01-23 LAB — POCT ACTIVATED CLOTTING TIME
Activated Clotting Time: 251 seconds
Activated Clotting Time: 220 seconds

## 2015-01-23 MED ORDER — METFORMIN HCL 1000 MG PO TABS: 1000.0000 mg | ORAL_TABLET | Freq: Two times a day (BID) | ORAL | Status: DC

## 2015-01-23 MED ORDER — ANGIOPLASTY BOOK
Freq: Once | Status: AC
  Administered 2015-01-23: 11:00:00
  Filled 2015-01-23: qty 1

## 2015-01-23 MED ORDER — ACETAMINOPHEN 325 MG PO TABS: 650.0000 mg | ORAL_TABLET | ORAL | Status: DC | PRN

## 2015-01-23 NOTE — Discharge Instructions (Signed)
Angiogram An angiogram is an X-ray test. It is used to look at your blood vessels. For this test, a dye is put into the blood vessel being checked. The dye shows up on X-rays. It helps your doctor see if there is a blockage or other problem in the blood vessel. BEFORE THE PROCEDURE  Follow your doctor's instructions about limiting what you eat or drink.  Ask your doctor if you may drink enough water to take any needed medicines the morning of the test.  Plan to have someone take you home after the test.  If you go home the same day as the test, plan to have someone stay with you for 24 hours. PROCEDURE   An IV tube will be put into one of your veins.  You will be given a medicine that makes you relax (sedative).  Your skin will be washed and shaved where the thin tube (catheter) will be inserted. This will usually be done in the upper part of your leg (groin). It may also be done in your arm near the elbow or in your wrist.  You will be given a medicine that numbs the area where the tube will be inserted (local anesthetic).  The tube will be inserted into a blood vessel.  Using a type of X-ray (fluoroscopy) to see, your doctor will move the tube into the blood vessel to check it.  Dye will be put in through the tube. X-rays of your blood vessels will then be taken. Different health care providers and hospitals may do this procedure differently. AFTER THE PROCEDURE   If the test is done through the leg, you will be kept in bed lying flat for several hours. You will be told to not bend or cross your legs.   The area where the tube was inserted will be checked often.   The pulse in your feet or wrist will be checked often.   More tests or X-rays may be done.    This information is not intended to replace advice given to you by your health care provider. Make sure you discuss any questions you have with your health care provider.   Document Released: 05/23/2008 Document  Revised: 03/17/2014 Document Reviewed: 07/28/2012 Elsevier Interactive Patient Education 2016 Elsevier Inc. Peripheral Vascular Disease Peripheral vascular disease (PVD) is a disease of the blood vessels that are not part of your heart and brain. A simple term for PVD is poor circulation. In most cases, PVD narrows the blood vessels that carry blood from your heart to the rest of your body. This can result in a decreased supply of blood to your arms, legs, and internal organs, like your stomach or kidneys. However, it most often affects a person's lower legs and feet. There are two types of PVD.  Organic PVD. This is the more common type. It is caused by damage to the structure of blood vessels.  Functional PVD. This is caused by conditions that make blood vessels contract and tighten (spasm). Without treatment, PVD tends to get worse over time. PVD can also lead to acute ischemic limb. This is when an arm or limb suddenly has trouble getting enough blood. This is a medical emergency.  HOME CARE  Take medicines only as told by your doctor.  Do not use any tobacco products, including cigarettes, chewing tobacco, or electronic cigarettes. If you need help quitting, ask your doctor.  Lose weight if you are overweight, and maintain a healthy weight as told by your  doctor.  Eat a diet that is low in fat and cholesterol. If you need help, ask your doctor.  Exercise regularly. Ask your doctor for some good activities for you.  Take good care of your feet.  Wear comfortable shoes that fit well.  Check your feet often for any cuts or sores. GET HELP IF:  You have cramps in your legs while walking.  You have leg pain when you are at rest.  You have coldness in a leg or foot.  Your skin changes.  You are unable to get or have an erection (erectile dysfunction).  You have cuts or sores on your feet that are not healing. GET HELP RIGHT AWAY IF:  Your arm or leg turns cold and  blue.  Your arms or legs become red, warm, swollen, painful, or numb.  You have chest pain or trouble breathing.  You suddenly have weakness in your face, arm, or leg.  You become very confused or you cannot speak.  You suddenly have a very bad headache.  You suddenly cannot see.   This information is not intended to replace advice given to you by your health care provider. Make sure you discuss any questions you have with your health care provider.   Document Released: 05/21/2009 Document Revised: 03/17/2014 Document Reviewed: 08/04/2013 Elsevier Interactive Patient Education [employer].

## 2015-01-23 NOTE — Discharge Summary (Signed)
Patient ID: Jessica Torres,  MRN: [id number], DOB/AGE: 1941-01-08 74 y.o.  Admit date: 01/22/2015 Discharge date: 01/23/2015  Primary Care Provider: [name], DO Primary Cardiologist: Dr berry  Discharge Diagnoses Principal Problem:   Claudication [facility]) Active Problems:   Peripheral arterial disease - Bilateral SFA    S/P -Billat SFA PTA with restenosis    Essential hypertension   Type 2 diabetes, uncontrolled, with peripheral circulatory disorder ([place])   Hyperlipidemia-statin intolerant    Procedures: 01/22/15- successful directional atherectomy, drug-eluting balloon of a short segment 95%" restenotic" lesion in the mid right SFA.   Hospital Course:  74 y/o female referred to Dr Gwenlyn Found in April 2016 by her podiatrist, Dr Paulla Dolly for evaluation of claudication. April 28th 2016 she underwent RSFA PTA and stenting followed by staged LSFA PTA and stenting 08/17/14. She did well initially but developed recurrent claudication. Dopllers in October suggested restenosis. She was seen by Dr Gwenlyn Found 01/03/15 and it was decided to proceed with PV angiogram. This was 01/11/15 and reveled bilateral in stent restenosis. The pt was admitted 01/22/15 for RFSA PTA as noted above. She tolerated this well and will be discharged 01/23/15. OP dopplers have been scheduled. She'll f/u with Dr Gwenlyn Found (or an APP when Dr Gwenlyn Found is in the office) to discuss treatment of her Lt SFA disease.  Discharge Vitals:  Blood pressure 143/55, pulse 94, temperature 98.3 F (36.8 C), temperature source Oral, resp. rate 16, height 5\' 5"  (1.651 m), weight 141 lb 1.5 oz (64 kg), SpO2 97 %.   PE- CV RRR without murmur         Chest- clear          Extrem- Rt groin without hematoma  Labs: Results for orders placed or performed during the hospital encounter of 01/22/15 (from the past 24 hour(s))  POCT Activated clotting time     Status: None   Collection Time: 01/22/15  2:06 PM  Result Value Ref Range   Activated  Clotting Time 251 seconds  POCT Activated clotting time     Status: None   Collection Time: 01/22/15  2:43 PM  Result Value Ref Range   Activated Clotting Time 220 seconds  Glucose, capillary     Status: Abnormal   Collection Time: 01/22/15  3:14 PM  Result Value Ref Range   Glucose-Capillary 138 (H) 65 - 99 mg/dL  POCT Activated clotting time     Status: None   Collection Time: 01/22/15  4:58 PM  Result Value Ref Range   Activated Clotting Time 140 seconds  Glucose, capillary     Status: None   Collection Time: 01/22/15  6:25 PM  Result Value Ref Range   Glucose-Capillary 92 65 - 99 mg/dL  Glucose, capillary     Status: Abnormal   Collection Time: 01/22/15  9:31 PM  Result Value Ref Range   Glucose-Capillary 258 (H) 65 - 99 mg/dL   Comment 1 Notify RN    Comment 2 Document in Chart   Basic metabolic panel      Status: Abnormal   Collection Time: 01/23/15  5:45 AM  Result Value Ref Range   Sodium 136 135 - 145 mmol/L   Potassium 4.3 3.5 - 5.1 mmol/L   Chloride 104 101 - 111 mmol/L   CO2 25 22 - 32 mmol/L   Glucose, Bld 198 (H) 65 - 99 mg/dL   BUN 11 6 - 20 mg/dL   Creatinine, Ser 1.02 (H) 0.44 - 1.00 mg/dL  Calcium 8.9 8.9 - 10.3 mg/dL   GFR calc non Af Amer 54 (L) >60 mL/min   GFR calc Af Amer >60 >60 mL/min   Anion gap 7 5 - 15  CBC     Status: None   Collection Time: 01/23/15  5:45 AM  Result Value Ref Range   WBC 8.8 4.0 - 10.5 K/uL   RBC 4.38 3.87 - 5.11 MIL/uL   Hemoglobin 12.5 12.0 - 15.0 g/dL   HCT 38.0 36.0 - 46.0 %   MCV 86.8 78.0 - 100.0 fL   MCH 28.5 26.0 - 34.0 pg   MCHC 32.9 30.0 - 36.0 g/dL   RDW 14.2 11.5 - 15.5 %   Platelets 281 150 - 400 K/uL  Glucose, capillary     Status: Abnormal   Collection Time: 01/23/15  6:47 AM  Result Value Ref Range   Glucose-Capillary 184 (H) 65 - 99 mg/dL   Comment 1 Notify RN    Comment 2 Document in Chart     Disposition:      Follow-up Information    Follow up with Quay Burow, MD.   Specialties:   Cardiology, Radiology   Why:  Office will contact you   Contact information:   [address] [place] [address] [phone]       Discharge Medications:    Medication List    TAKE these medications        acetaminophen 325 MG tablet  Commonly known as:  TYLENOL  Take 2 tablets (650 mg total) by mouth every 4 (four) hours as needed for headache or mild pain.     alendronate 70 MG tablet  Commonly known as:  FOSAMAX  Take 70 mg by mouth once a week.     aspirin EC 81 MG tablet  Take 81 mg by mouth daily.     clopidogrel 75 MG tablet  Commonly known as:  PLAVIX  Take 75 mg by mouth daily.     ezetimibe 10 MG tablet  Commonly known as:  ZETIA  Take 10 mg by mouth every other day. Alternating with Vytorin     glimepiride 4 MG tablet  Commonly known as:  AMARYL  Take 4 mg by mouth 2 (two) times daily.     levothyroxine 75 MCG tablet  Commonly known as:  SYNTHROID, LEVOTHROID  Take 75 mcg by mouth daily before breakfast.     losartan-hydrochlorothiazide 50-12.5 MG tablet  Commonly known as:  HYZAAR  Take 1 tablet by mouth daily.     metFORMIN 1000 MG tablet  Commonly known as:  GLUCOPHAGE  Take 1 tablet (1,000 mg total) by mouth 2 (two) times daily with a meal.  Start taking on:  01/25/2015     NESINA 25 MG Tabs  Generic drug:  Alogliptin Benzoate  Take 25 mg by mouth every morning.     pantoprazole 40 MG tablet  Commonly known as:  PROTONIX  Take 40 mg by mouth daily.     VYTORIN 10-40 MG tablet  Generic drug:  ezetimibe-simvastatin  Take 1 tablet by mouth every other day. Alternating with Zetia         Duration of Discharge Encounter: Greater than 30 minutes including physician time.  Angelena Form PA-C 01/23/2015 10:24 AM   Personally seen and examined. Agree with above. Leg warm, no complaints Ready to go home  Candee Furbish, MD

## 2015-01-24 NOTE — Care Management Note (Signed)
Case Management Note  Patient Details  Name: DALYA LANDRO MRN: [id number] Date of Birth: 1940/03/27  Subjective/Objective:    directional atherectomy, drug-eluting balloon of a short segment 95%" restenotic" lesion in the mid right SFA                Action/Plan: Late entry 01/23/2015 Spoke to pt and provided her with info on [place]. States she is having difficult with paying for Tesina, Zetia, and Vytorin. States she receives her Social Security check each month, but she still has mortgage. She states there are 8 people living in the home including her three adult children. Her son works but contributions are minimally. Currently she is receiving samples from MD office for these medications. Pt has drug coverage with her insurance plan and generic medications are free or $1-3 copay.    Expected Discharge Date:  01/23/2015               Expected Discharge Plan:  Home/Self Care  In-House Referral:     Discharge planning Services  CM Consult   Status of Service:  Completed, signed off  Medicare Important Message Given:    Date Medicare IM Given:    Medicare IM give by:    Date Additional Medicare IM Given:    Additional Medicare Important Message give by:     If discussed at [place] of Stay Meetings, dates discussed:    Additional Comments:  Erenest Rasher, RN 01/24/2015, 8:48 AM

## 2015-02-05 ENCOUNTER — Ambulatory Visit (HOSPITAL_COMMUNITY)
Admission: RE | Admit: 2015-02-05 | Discharge: 2015-02-05 | Disposition: A | Discharge status: Home / Self Care | Payer: Medicare HMO | Source: Ambulatory Visit | Attending: Cardiovascular Disease | Admitting: Cardiovascular Disease

## 2015-02-05 ENCOUNTER — Other Ambulatory Visit: Payer: Self-pay | Admitting: Cardiovascular Disease

## 2015-02-05 DIAGNOSIS — I779 Disorder of arteries and arterioles, unspecified: Secondary | ICD-10-CM | POA: Insufficient documentation

## 2015-02-05 DIAGNOSIS — I739 Peripheral vascular disease, unspecified: Secondary | ICD-10-CM

## 2015-02-05 DIAGNOSIS — E785 Hyperlipidemia, unspecified: Secondary | ICD-10-CM | POA: Diagnosis not present

## 2015-02-05 DIAGNOSIS — I10 Essential (primary) hypertension: Secondary | ICD-10-CM | POA: Diagnosis not present

## 2015-02-05 DIAGNOSIS — E119 Type 2 diabetes mellitus without complications: Secondary | ICD-10-CM | POA: Insufficient documentation

## 2015-02-06 ENCOUNTER — Encounter: Payer: Self-pay | Admitting: Physician Assistant

## 2015-02-06 ENCOUNTER — Ambulatory Visit (INDEPENDENT_AMBULATORY_CARE_PROVIDER_SITE_OTHER): Payer: Medicare HMO | Admitting: Physician Assistant

## 2015-02-06 VITALS — BP 114/60 | HR 81 | Ht 64.0 in | Wt 140.6 lb

## 2015-02-06 DIAGNOSIS — Z79899 Other long term (current) drug therapy: Secondary | ICD-10-CM

## 2015-02-06 DIAGNOSIS — I10 Essential (primary) hypertension: Secondary | ICD-10-CM | POA: Diagnosis not present

## 2015-02-06 DIAGNOSIS — E785 Hyperlipidemia, unspecified: Secondary | ICD-10-CM | POA: Diagnosis not present

## 2015-02-06 MED ORDER — SIMVASTATIN 40 MG PO TABS: 40.0000 mg | ORAL_TABLET | Freq: Every day | ORAL | Status: DC

## 2015-02-06 NOTE — Patient Instructions (Signed)
Your physician has recommended you make the following change in your medication: the vytorin has been replaced with simvastatin 40 mg and has been sent to your mail order pharmacy.  Your physician recommends that you return for fasting lab work in: 2 months.  We will call you with follow up appointment information once [facility] consults with Dr. Gwenlyn Found.

## 2015-02-06 NOTE — Progress Notes (Signed)
Cardiology Office Note   Date:  02/06/2015   ID:  [name], [name] [birth year], MRN [id number]  PCP:  [name], DO  Cardiologist:  Dr [name], PA-C   Chief Complaint  Patient presents with  . Follow-up    pt states no chest pain no SOB no light headedness or dizziness no edema    History of Present Illness: Jessica Torres is a 74 y.o. female with a history of bilat SFA interventions, HTN, DM, HL. D/c 11/15 after directional atherectomy and drug-eluting balloon PTA of 95% ISR R-SFA.   Jessica Torres presents for post hospital follow-up and evaluation.  She is having pain in her left calf whenever she walks any significant distance. She feels her activity is significantly limited by this. She is also aware that she has carotid disease and was wondering what is to be done about that.  She is out of the Vytorin because she has been taking samples given to her by her primary care physician. She was also getting Zetia from Dr. Melina Copa and still has a few pills of that left. Taking the Vytorin 10/40 alternating with Zetia 10 mg has been working well for her. However, the medications are too expensive for her to afford unless she can get samples.   Past Medical History  Diagnosis Date  . Peripheral arterial disease ([place])   . Hypertension   . Hyperlipidemia     statin intolerant  . PAD (peripheral artery disease) ([place])   . GERD (gastroesophageal reflux disease)   . Hypothyroidism   . Sleep apnea     "dx'd years ago; had severe problems; all of the sudden it just went away" (07/06/2014)  . Stroke ([place]) ~ 2010  . Arthritis     "fingers probably" (07/06/2014)  . Chronic lower back pain   . Bulging lumbar disc   . Urgency of urination   . Difficulty urinating   . Uterine cancer ([place])   . Shortness of breath dyspnea   . Type 2 diabetes mellitus ([place])     type 2    Past Surgical History  Procedure Laterality Date  . Balloon angioplasty, artery Right    SFA/notes 07/06/2014  . Carpal tunnel release Right 1980's  . Hemorrhoid surgery      "dr did it at work; in dr's office"  . Breast surgery    . Incision and drainage breast abscess Left   . Vaginal hysterectomy    . Lower extremity angiogram N/A 07/06/2014    Procedure: LOWER EXTREMITY ANGIOGRAM & R-SFA atherectomy and balloon angioplasty;  Surgeon: Lorretta Harp, MD;  Location: [facility] CATH LAB;  Service: Cardiovascular;  Laterality: R  . Peripheral vascular catheterization N/A 08/17/2014    Procedure: Lower Extremity Angiography;  Surgeon: Lorretta Harp, MD;  Location: [facility] CV LAB;  Service: Cardiovascular;  Laterality: N/A;  . Peripheral vascular catheterization Left 08/17/2014    Procedure: Peripheral Vascular Atherectomy and Viabahn stent L-SFA;  Surgeon: Lorretta Harp, MD; Laterality: Left; L-SFA  . Peripheral vascular catheterization N/A 01/11/2015    Procedure: Lower Extremity Angiography;  Surgeon: Lorretta Harp, MD;  Location: [facility] CV LAB;  Service: Cardiovascular;  Laterality: N/A;  . Tubal ligation    . Angioplasty Right 01/22/2015    w/ atherectomy R sfa  . Peripheral vascular catheterization Right 01/22/2015    Procedure: Peripheral Vascular Atherectomy;  Surgeon: Lorretta Harp, MD;  Location: [place] CV LAB;  Service: Cardiovascular;  Laterality: Right;    Current Outpatient Prescriptions  Medication Sig Dispense Refill  . acetaminophen (TYLENOL) 325 MG tablet Take 2 tablets (650 mg total) by mouth every 4 (four) hours as needed for headache or mild pain.    Marland Kitchen alendronate (FOSAMAX) 70 MG tablet Take 70 mg by mouth once a week.     . Alogliptin Benzoate (NESINA) 25 MG TABS Take 25 mg by mouth every morning.     Marland Kitchen aspirin EC 81 MG tablet Take 81 mg by mouth daily.    . clopidogrel (PLAVIX) 75 MG tablet Take 75 mg by mouth daily.     Marland Kitchen ezetimibe (ZETIA) 10 MG tablet Take 10 mg by mouth every other day. Alternating with Vytorin 90 tablet 3  . glimepiride  (AMARYL) 4 MG tablet Take 4 mg by mouth 2 (two) times daily.     Marland Kitchen levothyroxine (SYNTHROID, LEVOTHROID) 75 MCG tablet Take 75 mcg by mouth daily before breakfast.    . losartan-hydrochlorothiazide (HYZAAR) 50-12.5 MG per tablet Take 1 tablet by mouth daily.    . metFORMIN (GLUCOPHAGE) 1000 MG tablet Take 1 tablet (1,000 mg total) by mouth 2 (two) times daily with a meal.    . pantoprazole (PROTONIX) 40 MG tablet Take 40 mg by mouth daily.     . simvastatin (ZOCOR) 40 MG tablet Take 1 tablet (40 mg total) by mouth at bedtime. 90 tablet 3   No current facility-administered medications for this visit.    Allergies:   Morphine and related; Latex; and Statins    Social History:  The patient  reports that she has never smoked. She has never used smokeless tobacco. She reports that she drinks alcohol. She reports that she does not use illicit drugs.   Family History:  The patient's family history includes Arrhythmia in her mother; Diabetes in her sister; Heart attack in her mother; Heart disease in her father and mother.    ROS:  Please see the history of present illness. All other systems are reviewed and negative.    PHYSICAL EXAM: VS:  BP 114/60 mmHg  Pulse 81  Ht 5\' 4"  (1.626 m)  Wt 140 lb 9.6 oz (63.776 kg)  BMI 24.12 kg/m2 , BMI Body mass index is 24.12 kg/(m^2). GEN: Well nourished, well developed, female in no acute distress HEENT: normal for age  Neck: no JVD, no carotid bruits noted, no masses Cardiac: RRR; no murmur, no rubs, or gallops Respiratory:  clear to auscultation bilaterally, normal work of breathing GI: soft, nontender, nondistended, + BS MS: no deformity or atrophy; no edema; distal pulses are 1-2+ in right lower extremity, decreased in left lower extremity with delayed capillary refill.  Skin: warm and dry, no rash Neuro:  Strength and sensation are intact Psych: euthymic mood, full affect   EKG:  EKG is not ordered today.  PV Cath 11/03 Angiographic Data:    1: Abdominal aortogram-the distal abdominal aorta was free of significant disease 2: Left lower extremity-there was a 30% proximal left SFA stenosis. The previously placed Viabahn stent was occluded at its origin with reconstitution just distal to the stent by profunda femoris collaterals. There was a 70% above-the-knee popliteal stenosis, 95% ostial anterior tibial stenosis. 3: Right lower extremity-there was a 40% proximal segmental right SFA stenosis followed by a 95% short segmental mid right SFA stenosis with 2 vessel runoff. The anterior tibial is occluded IMPRESSION:Mrs. Sherral Hammers unfortunately has had occlusion of her previously placed Viabahn stent which was  put in April of this year. Her right SFA has had aggressive restenosis within the previously treated segment. I do not think she has a percutaneous option for long-term patency of her left SFA. The sheath was removed and pressure held. The patient will be hydrated for 4 hours and discharged home. We will arrange for her to have staged right SFA intervention in 2 weeks. She left the lab in stable condition.  Recent Labs: 08/11/2014: ALT 16; TSH 3.583 01/23/2015: BUN 11; Creatinine, Ser 1.02*; Hemoglobin 12.5; Platelets 281; Potassium 4.3; Sodium 136    Lipid Panel No results found for: CHOL, TRIG, HDL, CHOLHDL, VLDL, LDLCALC, LDLDIRECT   Wt Readings from Last 3 Encounters:  02/06/15 140 lb 9.6 oz (63.776 kg)  01/23/15 141 lb 1.5 oz (64 kg)  01/19/15 139 lb (63.05 kg)     Other studies Reviewed: Additional studies/ records that were reviewed today include: PV Caths, office records.  ASSESSMENT AND PLAN:  1.  PAD: Her left SFA stent was occluded at her PV cath 11/03. She has had an intervention to the right and her right leg is doing well. She is still having significant symptoms on the left. She thought Dr. Gwenlyn Found had a plan for this, and I will ask him to address this. I do not see carotid Dopplers in her history so I am not sure  exactly what vessel she is referring to when she says an artery in her neck has a blockage and needs to be fixed.  2. Hyperlipidemia: She has been taking alternate Zetia and Vytorin and tolerating them well. She has been tolerating this regimen well although she is currently out of Vytorin because she cannot afford it. Therefore, we will discontinue the Vytorin and start simvastatin 40 mg daily. She is to start this and it is okay to started at one half tablet daily and increase to one whole tablet daily if she tolerates it. She is to continue taking the Zetia. Unfortunately, we do not have samples of either the city or the Vytorin to give her.  We will check liver functions and lipid profile when she follows up with Dr. Gwenlyn Found, where she can obtain this through Dr. Melina Copa.  Current medicines are reviewed at length with the patient today.  The patient does not have concerns regarding medicines.  The following changes have been made:  Discontinue Vytorin, add simvastatin 40 and increase the Zetia to every day.  Labs/ tests ordered today include:   Orders Placed This Encounter  Procedures  . Lipid panel  . Hepatic function panel  . EKG 12-Lead     Disposition:   FU with Dr Gwenlyn Found  Signed, Rosaria Ferries, PA-C  02/06/2015 1:46 PM    [facility] [address], [place], [place]  [address] Phone: [phone]; Fax: [phone]

## 2015-02-07 ENCOUNTER — Telehealth: Payer: Self-pay | Admitting: *Deleted

## 2015-02-07 NOTE — Telephone Encounter (Signed)
Spoke with Donnie Aho PA regarding patient  Jessica Torres discussed with Dr Gwenlyn Found and he will review her records further and follow up appointment in December  Message sent to [facility] R to get appointment scheduled Advise patient

## 2015-02-15 NOTE — Telephone Encounter (Signed)
Appointment scheduled 12/19

## 2015-02-26 ENCOUNTER — Encounter: Payer: Self-pay | Admitting: Cardiovascular Disease

## 2015-02-26 ENCOUNTER — Ambulatory Visit (INDEPENDENT_AMBULATORY_CARE_PROVIDER_SITE_OTHER): Payer: Medicare HMO | Admitting: Cardiovascular Disease

## 2015-02-26 VITALS — BP 126/66 | HR 78 | Ht 65.0 in | Wt 138.2 lb

## 2015-02-26 DIAGNOSIS — I739 Peripheral vascular disease, unspecified: Secondary | ICD-10-CM

## 2015-02-26 DIAGNOSIS — I10 Essential (primary) hypertension: Secondary | ICD-10-CM | POA: Diagnosis not present

## 2015-02-26 DIAGNOSIS — E785 Hyperlipidemia, unspecified: Secondary | ICD-10-CM

## 2015-02-26 DIAGNOSIS — R0989 Other specified symptoms and signs involving the circulatory and respiratory systems: Secondary | ICD-10-CM | POA: Diagnosis not present

## 2015-02-26 MED ORDER — SIMVASTATIN 20 MG PO TABS: 20.0000 mg | ORAL_TABLET | Freq: Every day | ORAL | Status: DC

## 2015-02-26 NOTE — Assessment & Plan Note (Signed)
History of bilateral SFA intervention with a viable on covered stent placed in the left SFA 08/16/13 after directional atherectomy. She also had atherectomy of the right SFA remotely. Recent angiogram performed on 01/11/15 revealed an occluded left SFA with 95% mid right SFA stenosis. 2 weeks later on 01/22/15 she underwent directional atherectomy and drug-eluting balloon angioplasty of the mid right SFA with excellent angiographic clinical result. Follow-up Dopplers performed 02/05/15 revealed a right ABI 1.1 with a widely patent SFA and left ABI 0.62. I do not think she has a endovascular candidate to have her left SFA re-intervened on but rather I suspect she would require femoropopliteal bypass grafting which she does not wish to pursue at this time. I'm going to recheck lower extremity arterial Doppler studies in 6 months and I will see her back after that for further evaluation.

## 2015-02-26 NOTE — Assessment & Plan Note (Signed)
History of hyperlipidemia on simvastatin 40 mg a day. I'm going to reduce this to 20 mg a day and will recheck a lipid and liver profile in 2 months

## 2015-02-26 NOTE — Patient Instructions (Addendum)
Medication Instructions:  Your physician has recommended you make the following change in your medication:  1-Reduce Simvastatin (ZOCOR) 20 mg by mouth daily.  Labwork: Your physician recommends that you return for lab work in: 2 months fasting lipid and liver panel   Testing/Procedures: Your physician has requested that you have a lower extremity arterial exercise duplex 6 months. During this test, exercise and ultrasound are used to evaluate arterial blood flow in the legs. Allow one hour for this exam. There are no restrictions or special instructions.  Your physician has requested that you have a carotid duplex now. This test is an ultrasound of the carotid arteries in your neck. It looks at blood flow through these arteries that supply the brain with blood. Allow one hour for this exam. There are no restrictions or special instructions.   Follow-Up: We request that you follow-up in: 6 months with Dr Gwenlyn Found after Doppler of lower extremity arterials.  You will receive a reminder letter in the mail two months in advance. If you don't receive a letter, please call our office to schedule the follow-up appointment.  If you need a refill on your cardiac medications before your next appointment, please call your pharmacy.

## 2015-02-26 NOTE — Assessment & Plan Note (Signed)
History of hypertension blood pressure measured today at 126/66. She is on losartan and hydrochlorothiazide. Continue current meds at current dosing

## 2015-02-26 NOTE — Progress Notes (Signed)
02/26/2015 Jessica Torres   06/03/1940  [id number]  Primary Physician [name], DO Primary Cardiologist: Lorretta Harp MD Renae Gloss   HPI:  Mrs. Jessica Torres is a 74 year old female referred to me by Dr. Paulla Dolly, her podiatrist, for evaluation of peripheral vascular disease. I last saw her in the office 01/03/15.Her cardiac risk factor profile is notable for treated hypertension and diabetes. She does have hyperlipidemia but is statin intolerant. 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 She's never had a heart attack. She denies chest pain. Has had occ shortness of breath now. She's noticed bilateral lower extremity lifestyle limiting claudication over the last several months. Recent lower extremity arterial Doppler studies performed 06/19/14 revealed ABIs in the mid 0.5 range bilaterally with occluded SFAs. She was admitted 07/06/14 for lower extremity angiography and percutaneous intervention for lifestyle limiting claudication. I performed directional atherectomy followed by drug-eluting balloon angioplasty.She has residual disease on the Lt SFA .   She had staged Bilateral lower extremity interventions on 07/06/14 (right SFA directional atherectomy, drug-eluting balloon angioplasty), 08/16/13 (directional atherectomy, stenting using Viabahn cover stent). Her ABIs improved from the 0.5 range bilaterally to normal. Her claudication has resolved as well. She's had recurrent claudication over the last several weeks to months with recent lower showed a Doppler study performed 12/29/14 revealing recurrent disease in both SFAs. I angiogram to her on 01/11/15 revealing a 95% "restenosis" in the previously treated right SFA and a totally occluded left SFA Viabahn stent. sshe underwent Hawk 1 directional atherectomy, and drug-eluting balloon angioplasty of her right SFA stenosis with an excellent angiographic result. Dopplers performed on 02/05/15 revealed an normal right  ABI with widely patent SFA and left ABI of 0.62.  Current Outpatient Prescriptions  Medication Sig Dispense Refill  . acetaminophen (TYLENOL) 325 MG tablet Take 2 tablets (650 mg total) by mouth every 4 (four) hours as needed for headache or mild pain.    Marland Kitchen alendronate (FOSAMAX) 70 MG tablet Take 70 mg by mouth once a week.     . Alogliptin Benzoate (NESINA) 25 MG TABS Take 25 mg by mouth every morning.     Marland Kitchen aspirin EC 81 MG tablet Take 81 mg by mouth daily.    . clopidogrel (PLAVIX) 75 MG tablet Take 75 mg by mouth daily.     Marland Kitchen ezetimibe (ZETIA) 10 MG tablet Take 10 mg by mouth every other day. Alternating with Vytorin 90 tablet 3  . glimepiride (AMARYL) 4 MG tablet Take 4 mg by mouth 2 (two) times daily.     Marland Kitchen levothyroxine (SYNTHROID, LEVOTHROID) 75 MCG tablet Take 75 mcg by mouth daily before breakfast.    . losartan-hydrochlorothiazide (HYZAAR) 50-12.5 MG per tablet Take 1 tablet by mouth daily.    . metFORMIN (GLUCOPHAGE) 1000 MG tablet Take 1 tablet (1,000 mg total) by mouth 2 (two) times daily with a meal.    . pantoprazole (PROTONIX) 40 MG tablet Take 40 mg by mouth daily.      No current facility-administered medications for this visit.    Allergies  Allergen Reactions  . Morphine And Related Swelling and Other (See Comments)    Facial swelling  . Latex Rash  . Statins Other (See Comments)    Myalgias     Social History   Social History  . Marital Status: Divorced    Spouse Name: N/A  . Number of Children: N/A  . Years of Education: N/A   Occupational History  .  Not on file.   Social History Main Topics  . Smoking status: Never Smoker   . Smokeless tobacco: Never Used  . Alcohol Use: 0.0 oz/week    0 Standard drinks or equivalent per week     Comment: 07/06/2014 "glass of wine once 2 wks or so"  . Drug Use: No  . Sexual Activity: No   Other Topics Concern  . Not on file   Social History Narrative     Review of Systems: General: negative for chills,  fever, night sweats or weight changes.  Cardiovascular: negative for chest pain, dyspnea on exertion, edema, orthopnea, palpitations, paroxysmal nocturnal dyspnea or shortness of breath Dermatological: negative for rash Respiratory: negative for cough or wheezing Urologic: negative for hematuria Abdominal: negative for nausea, vomiting, diarrhea, bright red blood per rectum, melena, or hematemesis Neurologic: negative for visual changes, syncope, or dizziness All other systems reviewed and are otherwise negative except as noted above.    Blood pressure 126/66, pulse 78, height 5\' 5"  (1.651 m), weight 138 lb 3.2 oz (62.687 kg).  General appearance: alert and no distress Neck: no adenopathy, no JVD, supple, symmetrical, trachea midline, thyroid not enlarged, symmetric, no tenderness/mass/nodules and soft right carotid bruit Lungs: clear to auscultation bilaterally Heart: regular rate and rhythm, S1, S2 normal, no murmur, click, rub or gallop Extremities: extremities normal, atraumatic, no cyanosis or edema and palpable right pedal pulse  EKG not performed today  ASSESSMENT AND PLAN:   S/P -Billat SFA PTA with restenosis  History of bilateral SFA intervention with a viable on covered stent placed in the left SFA 08/16/13 after directional atherectomy. She also had atherectomy of the right SFA remotely. Recent angiogram performed on 01/11/15 revealed an occluded left SFA with 95% mid right SFA stenosis. 2 weeks later on 01/22/15 she underwent directional atherectomy and drug-eluting balloon angioplasty of the mid right SFA with excellent angiographic clinical result. Follow-up Dopplers performed 02/05/15 revealed a right ABI 1.1 with a widely patent SFA and left ABI 0.62. I do not think she has a endovascular candidate to have her left SFA re-intervened on but rather I suspect she would require femoropopliteal bypass grafting which she does not wish to pursue at this time. I'm going to recheck lower  extremity arterial Doppler studies in 6 months and I will see her back after that for further evaluation.  Hyperlipidemia-statin intolerant History of hyperlipidemia on simvastatin 40 mg a day. I'm going to reduce this to 20 mg a day and will recheck a lipid and liver profile in 2 months  Essential hypertension History of hypertension blood pressure measured today at 126/66. She is on losartan and hydrochlorothiazide. Continue current meds at current dosing      Lorretta Harp MD [facility], [facility] 02/26/2015 2:01 PM

## 2015-03-06 ENCOUNTER — Ambulatory Visit: Payer: Medicare HMO | Admitting: Cardiovascular Disease

## 2015-03-21 ENCOUNTER — Ambulatory Visit (HOSPITAL_COMMUNITY)
Admission: RE | Admit: 2015-03-21 | Discharge: 2015-03-21 | Disposition: A | Discharge status: Home / Self Care | Payer: Medicare HMO | Source: Ambulatory Visit | Attending: Cardiovascular Disease | Admitting: Cardiovascular Disease

## 2015-03-21 DIAGNOSIS — I6523 Occlusion and stenosis of bilateral carotid arteries: Secondary | ICD-10-CM | POA: Diagnosis not present

## 2015-03-21 DIAGNOSIS — R0989 Other specified symptoms and signs involving the circulatory and respiratory systems: Secondary | ICD-10-CM | POA: Diagnosis not present

## 2015-09-24 ENCOUNTER — Other Ambulatory Visit: Payer: Self-pay | Admitting: *Deleted

## 2015-09-24 DIAGNOSIS — Z1231 Encounter for screening mammogram for malignant neoplasm of breast: Secondary | ICD-10-CM

## 2015-10-18 ENCOUNTER — Encounter: Payer: Self-pay | Admitting: "Endocrinology

## 2015-10-18 DIAGNOSIS — I2510 Atherosclerotic heart disease of native coronary artery without angina pectoris: Secondary | ICD-10-CM | POA: Insufficient documentation

## 2015-10-18 DIAGNOSIS — E039 Hypothyroidism, unspecified: Secondary | ICD-10-CM | POA: Insufficient documentation

## 2015-10-26 ENCOUNTER — Ambulatory Visit
Admission: RE | Admit: 2015-10-26 | Discharge: 2015-10-26 | Disposition: A | Discharge status: Home / Self Care | Payer: Medicare HMO | Source: Ambulatory Visit | Attending: *Deleted | Admitting: *Deleted

## 2015-10-26 DIAGNOSIS — Z1231 Encounter for screening mammogram for malignant neoplasm of breast: Secondary | ICD-10-CM

## 2015-11-28 ENCOUNTER — Ambulatory Visit: Payer: Medicare HMO | Admitting: "Endocrinology

## 2016-01-11 ENCOUNTER — Other Ambulatory Visit: Payer: Self-pay | Admitting: Physician Assistant

## 2016-01-28 ENCOUNTER — Other Ambulatory Visit: Payer: Self-pay | Admitting: Physician Assistant

## 2016-01-28 DIAGNOSIS — Z1211 Encounter for screening for malignant neoplasm of colon: Secondary | ICD-10-CM | POA: Insufficient documentation

## 2016-01-28 DIAGNOSIS — K219 Gastro-esophageal reflux disease without esophagitis: Secondary | ICD-10-CM | POA: Insufficient documentation

## 2016-02-13 ENCOUNTER — Encounter (HOSPITAL_COMMUNITY): Payer: Self-pay | Admitting: Cardiovascular Disease

## 2016-04-28 DIAGNOSIS — R5383 Other fatigue: Secondary | ICD-10-CM | POA: Insufficient documentation

## 2016-05-07 DIAGNOSIS — E559 Vitamin D deficiency, unspecified: Secondary | ICD-10-CM | POA: Insufficient documentation

## 2016-08-08 ENCOUNTER — Telehealth: Payer: Self-pay | Admitting: *Deleted

## 2016-08-08 NOTE — Telephone Encounter (Signed)
Ms. Denno called after receiving a letter from the [facility] Research department since we were unable to reach her by phone for her SAFE-DCB 2 year follow-up. She has had no additional interventions to her right leg. I will contact again in 1 year for the last follow-up.

## 2016-09-16 ENCOUNTER — Ambulatory Visit (INDEPENDENT_AMBULATORY_CARE_PROVIDER_SITE_OTHER): Payer: Medicare HMO | Admitting: Cardiovascular Disease

## 2016-09-16 ENCOUNTER — Encounter: Payer: Self-pay | Admitting: Cardiovascular Disease

## 2016-09-16 VITALS — BP 136/73 | HR 78 | Ht 65.0 in | Wt 146.0 lb

## 2016-09-16 DIAGNOSIS — E7800 Pure hypercholesterolemia, unspecified: Secondary | ICD-10-CM

## 2016-09-16 DIAGNOSIS — I10 Essential (primary) hypertension: Secondary | ICD-10-CM | POA: Diagnosis not present

## 2016-09-16 DIAGNOSIS — I739 Peripheral vascular disease, unspecified: Secondary | ICD-10-CM

## 2016-09-16 NOTE — Assessment & Plan Note (Signed)
History of arterial disease status post bilateral SFA intervention with a occluded Viabahn  stent in her mid left SFA. She underwent right and right SFA intervention by myself 01/11/15 with 1 directional atherectomy followed by drug-eluting balloon angioplasty. Her follow-up Dopplers performed 02/05/15 revealed a widely patent right SFA the right ABI of 1.1 and left ABI of 0.62. She says that now both legs have numbness in claudication and she suspects that her left SFA has had totally occluded. She does not smoke. I'm going to obtain lower extremity arterial Doppler studies with the intent to perform angiography and re-intervention on her right SFA.

## 2016-09-16 NOTE — Assessment & Plan Note (Signed)
History of essential hypertension blood pressure measured 136/73. She is on losartan and hydrochlorothiazide. Continue current meds at current dosing

## 2016-09-16 NOTE — Patient Instructions (Signed)
Medication Instructions: Your physician recommends that you continue on your current medications as directed. Please refer to the Current Medication list given to you today.  Testing/Procedures: Your physician has requested that you have a lower extremity arterial duplex. During this test, ultrasound is used to evaluate arterial blood flow in the legs. Allow one hour for this exam. There are no restrictions or special instructions.  Your physician has requested that you have an ankle brachial index (ABI). During this test an ultrasound and blood pressure cuff are used to evaluate the arteries that supply the arms and legs with blood. Allow thirty minutes for this exam. There are no restrictions or special instructions.  Follow-Up: Your physician recommends that you schedule a follow-up appointment after testing with Dr. Gwenlyn Found.  If you need a refill on your cardiac medications before your next appointment, please call your pharmacy.

## 2016-09-16 NOTE — Progress Notes (Signed)
09/16/2016 Jessica Torres   07/03/1940  [id number]  Primary Physician Jessica Hartshorn, NP Primary Cardiologist: Jessica Harp MD Jessica Torres  HPI:  Jessica Torres is a 76 year old female referred to me by Jessica Torres, her podiatrist, for evaluation of peripheral vascular disease. I last saw her in the office 02/26/15.Her cardiac risk factor profile is notable for treated hypertension and diabetes. She does have hyperlipidemia but is statin intolerant. 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 She's never had a heart attack. She denies chest pain. Has had occ shortness of breath now. She's noticed bilateral lower extremity lifestyle limiting claudication over the last several months. Recent lower extremity arterial Doppler studies performed 06/19/14 revealed ABIs in the mid 0.5 range bilaterally with occluded SFAs. She was admitted 07/06/14 for lower extremity angiography and percutaneous intervention for lifestyle limiting claudication. I performed directional atherectomy followed by drug-eluting balloon angioplasty.She has residual disease on the Lt SFA .   She had staged Bilateral lower extremity interventions on 07/06/14 (right SFA directional atherectomy, drug-eluting balloon angioplasty), 08/16/13 (directional atherectomy, stenting using Viabahn cover stent). Her ABIs improved from the 0.5 range bilaterally to normal. Her claudication has resolved as well. She's had recurrent claudication over the last several weeks to months with recent lower showed a Doppler study performed 12/29/14 revealing recurrent disease in both SFAs. I angiogram to her on 01/11/15 revealing a 95% "restenosis" in the previously treated right SFA and a totally occluded left SFA Viabahn stent. sshe underwent Hawk 1 directional atherectomy, and drug-eluting balloon angioplasty of her right SFA stenosis with an excellent angiographic result. Dopplers performed on 02/05/15 revealed an normal  right ABI with widely patent SFA and left ABI of 0.62.  Since I saw her in the office a year and a half ago she says that her right leg has had recurrent symptoms in both feet and legs are now numb with bilateral claudication.   Current Outpatient Prescriptions  Medication Sig Dispense Refill  . aspirin EC 81 MG tablet Take 81 mg by mouth daily.    Marland Kitchen atorvastatin (LIPITOR) 80 MG tablet Take 80 mg by mouth daily.    . clopidogrel (PLAVIX) 75 MG tablet Take 75 mg by mouth daily.     . ergocalciferol (VITAMIN D2) 50000 units capsule Take 50,000 Units by mouth once a week.    Marland Kitchen glimepiride (AMARYL) 4 MG tablet Take 4 mg by mouth 2 (two) times daily.     Marland Kitchen levothyroxine (SYNTHROID, LEVOTHROID) 88 MCG tablet Take 88 mcg by mouth daily before breakfast.     . losartan-hydrochlorothiazide (HYZAAR) 50-12.5 MG per tablet Take 1 tablet by mouth daily.    . metFORMIN (GLUCOPHAGE) 1000 MG tablet Take 1 tablet (1,000 mg total) by mouth 2 (two) times daily with a meal.    . pantoprazole (PROTONIX) 40 MG tablet Take 40 mg by mouth daily.      No current facility-administered medications for this visit.     Allergies  Allergen Reactions  . Morphine And Related Swelling and Other (See Comments)    Facial swelling  . Latex Rash  . Statins Other (See Comments)    Myalgias     Social History   Social History  . Marital status: Divorced    Spouse name: N/A  . Number of children: N/A  . Years of education: N/A   Occupational History  . Not on file.   Social History Main Topics  .  Smoking status: Never Smoker  . Smokeless tobacco: Never Used  . Alcohol use 0.0 oz/week     Comment: 07/06/2014 "glass of wine once 2 wks or so"  . Drug use: No  . Sexual activity: No   Other Topics Concern  . Not on file   Social History Narrative  . No narrative on file     Review of Systems: General: negative for chills, fever, night sweats or weight changes.  Cardiovascular: negative for chest pain,  dyspnea on exertion, edema, orthopnea, palpitations, paroxysmal nocturnal dyspnea or shortness of breath Dermatological: negative for rash Respiratory: negative for cough or wheezing Urologic: negative for hematuria Abdominal: negative for nausea, vomiting, diarrhea, bright red blood per rectum, melena, or hematemesis Neurologic: negative for visual changes, syncope, or dizziness All other systems reviewed and are otherwise negative except as noted above.    Blood pressure 136/73, pulse 78, height 5\' 5"  (1.651 m), weight 146 lb (66.2 kg).  General appearance: alert and no distress Neck: no adenopathy, no JVD, supple, symmetrical, trachea midline, thyroid not enlarged, symmetric, no tenderness/mass/nodules and Soft right carotid bruit Lungs: clear to auscultation bilaterally Heart: regular rate and rhythm, S1, S2 normal, no murmur, click, rub or gallop Extremities: extremities normal, atraumatic, no cyanosis or edema  EKG sinus rhythm at 78 without ST or T-wave changes. I personally reviewed this EKG.  ASSESSMENT AND PLAN:   Peripheral arterial disease - Bilateral SFA  History of arterial disease status post bilateral SFA intervention with a occluded Viabahn  stent in her mid left SFA. She underwent right and right SFA intervention by myself 01/11/15 with 1 directional atherectomy followed by drug-eluting balloon angioplasty. Her follow-up Dopplers performed 02/05/15 revealed a widely patent right SFA the right ABI of 1.1 and left ABI of 0.62. She says that now both legs have numbness in claudication and she suspects that her left SFA has had totally occluded. She does not smoke. I'm going to obtain lower extremity arterial Doppler studies with the intent to perform angiography and re-intervention on her right SFA.  Essential hypertension History of essential hypertension blood pressure measured 136/73. She is on losartan and hydrochlorothiazide. Continue current meds at current  dosing  Hyperlipidemia-statin intolerant History of hyperlipidemia on statin therapy followed by her PCP      Jessica Harp MD Jessica Torres, [place] Torres 09/16/2016 4:10 PM

## 2016-09-16 NOTE — Assessment & Plan Note (Signed)
History of hyperlipidemia on statin therapy followed by her PCP. 

## 2016-09-18 ENCOUNTER — Other Ambulatory Visit: Payer: Self-pay | Admitting: Adult Health Nurse Practitioner

## 2016-09-18 DIAGNOSIS — Z1231 Encounter for screening mammogram for malignant neoplasm of breast: Secondary | ICD-10-CM

## 2016-10-03 ENCOUNTER — Ambulatory Visit (HOSPITAL_COMMUNITY)
Admission: RE | Admit: 2016-10-03 | Discharge: 2016-10-03 | Disposition: A | Discharge status: Home / Self Care | Payer: Medicare HMO | Source: Ambulatory Visit | Attending: Cardiovascular Disease | Admitting: Cardiovascular Disease

## 2016-10-03 DIAGNOSIS — I739 Peripheral vascular disease, unspecified: Secondary | ICD-10-CM | POA: Insufficient documentation

## 2016-10-08 ENCOUNTER — Encounter: Payer: Self-pay | Admitting: Cardiovascular Disease

## 2016-10-08 ENCOUNTER — Ambulatory Visit (INDEPENDENT_AMBULATORY_CARE_PROVIDER_SITE_OTHER): Payer: Medicare HMO | Admitting: Cardiovascular Disease

## 2016-10-08 VITALS — BP 130/65 | HR 72 | Ht 65.0 in | Wt 147.2 lb

## 2016-10-08 DIAGNOSIS — I739 Peripheral vascular disease, unspecified: Secondary | ICD-10-CM

## 2016-10-08 DIAGNOSIS — I10 Essential (primary) hypertension: Secondary | ICD-10-CM

## 2016-10-08 NOTE — Progress Notes (Signed)
10/08/2016 Jessica Torres   [birth year]  [id number]  Primary Physician Jessica Hartshorn, NP Primary Cardiologist: Jessica Harp MD Jessica Torres  HPI:  Jessica Torres is a 76 y.o. female  female referred to me by Dr. Paulla Torres, her podiatrist, for evaluation of peripheral vascular disease. I last saw her in the office 02/26/15.Her cardiac risk factor profile is notable for treated hypertension and diabetes. She does have hyperlipidemia but is statin intolerant. 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 She's never had a heart attack. She denies chest pain. Has had occ shortness of breath now. She's noticed bilateral lower extremity lifestyle limiting claudication over the last several months. Recent lower extremity arterial Doppler studies performed 06/19/14 revealed ABIs in the mid 0.5 range bilaterally with occluded SFAs. She was admitted 07/06/14 for lower extremity angiography and percutaneous intervention for lifestyle limiting claudication. I performed directional atherectomy followed by drug-eluting balloon angioplasty.She has residual disease on the Lt SFA .   She had staged Bilateral lower extremity interventions on 07/06/14 (right SFA directional atherectomy, drug-eluting balloon angioplasty), 08/16/13 (directional atherectomy, stenting using Viabahn cover stent). Her ABIs improved from the 0.5 range bilaterally to normal. Her claudication has resolved as well. She's had recurrent claudication over the last several weeks to months with recent lower showed a Doppler study performed 12/29/14 revealing recurrent disease in both SFAs. I angiogram to her on 01/11/15 revealing a 95% "restenosis" in the previously treated right SFA and a totally occluded left SFA Viabahn stent. sshe underwent Hawk 1 directional atherectomy, and drug-eluting balloon angioplasty of her right SFA stenosis with an excellent angiographic result. Dopplers performed on 02/05/15 revealed an  normal right ABI with widely patent SFA and left ABI of 0.62.  Since I saw her in the office a year and a half ago she says that her right leg has had recurrent symptoms in both feet and legs are now numb with bilateral claudication. Recent Dopplers performed 10/03/16 revealed right ABI 0.73 and a left of 0.56. Her mid right SFA appears to be occluded.    Current Meds  Medication Sig  . aspirin EC 81 MG tablet Take 81 mg by mouth daily.  Marland Kitchen atorvastatin (LIPITOR) 80 MG tablet Take 80 mg by mouth daily.  . clopidogrel (PLAVIX) 75 MG tablet Take 75 mg by mouth daily.   . ergocalciferol (VITAMIN D2) 50000 units capsule Take 50,000 Units by mouth once a week.  Marland Kitchen glimepiride (AMARYL) 4 MG tablet Take 4 mg by mouth 2 (two) times daily.   Marland Kitchen levothyroxine (SYNTHROID, LEVOTHROID) 88 MCG tablet Take 88 mcg by mouth daily before breakfast.   . losartan-hydrochlorothiazide (HYZAAR) 50-12.5 MG per tablet Take 1 tablet by mouth daily.  . metFORMIN (GLUCOPHAGE) 1000 MG tablet Take 1 tablet (1,000 mg total) by mouth 2 (two) times daily with a meal.  . pantoprazole (PROTONIX) 40 MG tablet Take 40 mg by mouth daily.      Allergies  Allergen Reactions  . Morphine And Related Swelling and Other (See Comments)    Facial swelling  . Latex Rash  . Statins Other (See Comments)    Myalgias     Social History   Social History  . Marital status: Divorced    Spouse name: N/A  . Number of children: N/A  . Years of education: N/A   Occupational History  . Not on file.   Social History Main Topics  . Smoking status: Never  Smoker  . Smokeless tobacco: Never Used  . Alcohol use 0.0 oz/week     Comment: 07/06/2014 "glass of wine once 2 wks or so"  . Drug use: No  . Sexual activity: No   Other Topics Concern  . Not on file   Social History Narrative  . No narrative on file     Review of Systems: General: negative for chills, fever, night sweats or weight changes.  Cardiovascular: negative for  chest pain, dyspnea on exertion, edema, orthopnea, palpitations, paroxysmal nocturnal dyspnea or shortness of breath Dermatological: negative for rash Respiratory: negative for cough or wheezing Urologic: negative for hematuria Abdominal: negative for nausea, vomiting, diarrhea, bright red blood per rectum, melena, or hematemesis Neurologic: negative for visual changes, syncope, or dizziness All other systems reviewed and are otherwise negative except as noted above.    Blood pressure 130/65, pulse 72, height 5\' 5"  (1.651 m), weight 147 lb 3.2 oz (66.8 kg), SpO2 98 %.  General appearance: alert and no distress Neck: no adenopathy, no carotid bruit, no JVD, supple, symmetrical, trachea midline and thyroid not enlarged, symmetric, no tenderness/mass/nodules Lungs: clear to auscultation bilaterally Heart: regular rate and rhythm, S1, S2 normal, no murmur, click, rub or gallop Extremities: extremities normal, atraumatic, no cyanosis or edema  EKG not performed today  ASSESSMENT AND PLAN:   Peripheral arterial disease - Bilateral SFA  Jessica Torres has had bilateral SFA intervention with occluded left SFA Viabahn  stent and left ABI 0.56. She did have directional atherectomy and drug-eluting balloon angioplasty of her right SFA 01/22/15. Recent Dopplers performed 10/03/16 revealed a right ABI 0.73 with an occluded mid right SFA. She wishes to have this reinterviewed and on for lifestyle limiting claudication.      Jessica Harp MD FACP,FACC,FAHA, [facility] 10/08/2016 12:32 PM

## 2016-10-08 NOTE — Patient Instructions (Signed)
   Jessica Torres [address] [place] [address] Dept: [id number] Loc: [phone]  Jessica Torres  10/08/2016  You are scheduled for a Peripheral Angiogram on Thursday, August 23 with Dr. Quay Burow.  1. Please arrive at the [facility] (Main Entrance A) at [facility]: [address] [place], [place] [address] at 7:30 AM (two hours before your procedure to ensure your preparation). Free valet parking service is available.   Special note: Every effort is made to have your procedure done on time. Please understand that emergencies sometimes delay scheduled procedures.  2. Diet: Do not eat or drink anything after midnight prior to your procedure except sips of water to take medications.  3. Labs: You will need to have blood drawn on Monday, August 13 in our office. No appointment necessary. You do not need to be fasting.  ---A chest x-ray takes a picture of the organs and structures inside the chest, including the heart, lungs, and blood vessels. This test can show several things, including, whether the heart is enlarges; whether fluid is building up in the lungs; and whether pacemaker / defibrillator leads are still in place.---  4. Medication instructions in preparation for your procedure:  Do not take Glimepiride on the morning of your procedure.  Do not take Metformin the night before or the morning of your procedure.  On the morning of your procedure, take your Plavix/Clopidogrel and any morning medicines NOT listed above.  You may use sips of water.  5. Plan for one night stay--bring personal belongings. 6. Bring a current list of your medications and current insurance cards. 7. You MUST have a responsible person to drive you home. 8. Someone MUST be with you the first 24 hours after you arrive home or your discharge will be delayed. 9. Please wear clothes that are easy to get  on and off and wear slip-on shoes.  Thank you for allowing [place] to care for you!   -- [facility]   Schedule tests for after your procedure:  Your physician has requested that you have a lower extremity arterial duplex. During this test, ultrasound is used to evaluate arterial blood flow in the legs. Allow one hour for this exam. There are no restrictions or special instructions.  Your physician has requested that you have an ankle brachial index (ABI). During this test an ultrasound and blood pressure cuff are used to evaluate the arteries that supply the arms and legs with blood. Allow thirty minutes for this exam. There are no restrictions or special instructions.

## 2016-10-08 NOTE — Assessment & Plan Note (Signed)
Mrs. Jessica Torres has had bilateral SFA intervention with occluded left SFA Viabahn  stent and left ABI 0.56. She did have directional atherectomy and drug-eluting balloon angioplasty of her right SFA 01/22/15. Recent Dopplers performed 10/03/16 revealed a right ABI 0.73 with an occluded mid right SFA. She wishes to have this reinterviewed and on for lifestyle limiting claudication.

## 2016-10-17 ENCOUNTER — Telehealth: Payer: Self-pay | Admitting: Cardiovascular Disease

## 2016-10-17 NOTE — Telephone Encounter (Signed)
Phone number listed rings to starla mclean, no message left

## 2016-10-17 NOTE — Telephone Encounter (Signed)
New message    Pt is to have surgery on 10/30/16 she forgot to tell you she is on 2 types of insulin , should she still take them the day of her procedure or wait?

## 2016-10-20 ENCOUNTER — Ambulatory Visit
Admission: RE | Admit: 2016-10-20 | Discharge: 2016-10-20 | Disposition: A | Discharge status: Home / Self Care | Payer: Medicare HMO | Source: Ambulatory Visit | Attending: Cardiovascular Disease | Admitting: Cardiovascular Disease

## 2016-10-20 DIAGNOSIS — I10 Essential (primary) hypertension: Secondary | ICD-10-CM

## 2016-10-20 DIAGNOSIS — I739 Peripheral vascular disease, unspecified: Secondary | ICD-10-CM

## 2016-10-20 NOTE — Telephone Encounter (Signed)
Returned call to patient. She states she is coming to the office today to have lab work done and was planning to talk to someone about her procedure instructions. She states she was scheduled to have a CXR on 8/28 (after PV angiogram) - advised no appt is needed and she can go today to [facility]. She takes Toujeo 300U QHS and Humalog 11U TID w/meals  Advised that Lovena Le, CMA can review instructions with her today in the office while here for labs. She voiced understanding.

## 2016-10-21 LAB — BASIC METABOLIC PANEL
BUN / CREAT RATIO: 11 — AB (ref 12–28)
BUN: 17 mg/dL (ref 8–27)
CHLORIDE: 100 mmol/L (ref 96–106)
CO2: 22 mmol/L (ref 20–29)
CREATININE: 1.53 mg/dL — AB (ref 0.57–1.00)
Calcium: 9.8 mg/dL (ref 8.7–10.3)
GFR calc Af Amer: 39 mL/min/{1.73_m2} — ABNORMAL LOW (ref >59)
GFR calc non Af Amer: 34 mL/min/{1.73_m2} — ABNORMAL LOW (ref >59)
Glucose: 341 mg/dL — ABNORMAL HIGH (ref 65–99)
Potassium: 4.9 mmol/L (ref 3.5–5.2)
Sodium: 137 mmol/L (ref 134–144)

## 2016-10-21 LAB — CBC WITH DIFFERENTIAL/PLATELET
BASOS: 1 %
Basophils Absolute: 0.1 10*3/uL (ref 0.0–0.2)
EOS (ABSOLUTE): 0.2 10*3/uL (ref 0.0–0.4)
EOS: 3 %
HEMOGLOBIN: 12.4 g/dL (ref 11.1–15.9)
Hematocrit: 39.1 % (ref 34.0–46.6)
IMMATURE GRANS (ABS): 0 10*3/uL (ref 0.0–0.1)
Immature Granulocytes: 0 %
LYMPHS ABS: 1.9 10*3/uL (ref 0.7–3.1)
LYMPHS: 24 %
MCH: 28.1 pg (ref 26.6–33.0)
MCHC: 31.7 g/dL (ref 31.5–35.7)
MCV: 89 fL (ref 79–97)
MONOCYTES: 8 %
Monocytes Absolute: 0.6 10*3/uL (ref 0.1–0.9)
Neutrophils Absolute: 4.9 10*3/uL (ref 1.4–7.0)
Neutrophils: 64 %
Platelets: 376 10*3/uL (ref 150–379)
RBC: 4.41 x10E6/uL (ref 3.77–5.28)
RDW: 14.5 % (ref 12.3–15.4)
WBC: 7.7 10*3/uL (ref 3.4–10.8)

## 2016-10-21 LAB — APTT: APTT: 23 s — AB (ref 24–33)

## 2016-10-21 LAB — TSH: TSH: 2.21 u[IU]/mL (ref 0.450–4.500)

## 2016-10-21 LAB — PROTIME-INR
INR: 1 (ref 0.8–1.2)
Prothrombin Time: 10.5 s (ref 9.1–12.0)

## 2016-10-24 ENCOUNTER — Telehealth: Payer: Self-pay | Admitting: Cardiovascular Disease

## 2016-10-24 DIAGNOSIS — Z79899 Other long term (current) drug therapy: Secondary | ICD-10-CM

## 2016-10-24 NOTE — Telephone Encounter (Signed)
Notes recorded by Lorretta Harp, MD on 10/21/2016 at 11:02 AM EDT Serum creatinine significantly elevated. Please repeat basic metabolic panel. Creatinine remains high she would need pre-hydration prior to any peripheral intervention.  Patient called with results - will come by for repeat BMET On Monday 8/20. She is aware she may need pre-admit for hydration if renal function has not improved.   She also wants MD to be aware that she is on the following diabetic medications and needs instructions w/what to hold for procedure 8/23: metformin, humalog, toujeo, glimepiride.

## 2016-10-27 ENCOUNTER — Ambulatory Visit
Admission: RE | Admit: 2016-10-27 | Discharge: 2016-10-27 | Disposition: A | Discharge status: Home / Self Care | Payer: Medicare HMO | Source: Ambulatory Visit | Attending: Adult Health Nurse Practitioner | Admitting: Adult Health Nurse Practitioner

## 2016-10-27 ENCOUNTER — Other Ambulatory Visit: Payer: Self-pay | Admitting: *Deleted

## 2016-10-27 DIAGNOSIS — Z79899 Other long term (current) drug therapy: Secondary | ICD-10-CM

## 2016-10-27 DIAGNOSIS — Z1231 Encounter for screening mammogram for malignant neoplasm of breast: Secondary | ICD-10-CM

## 2016-10-27 NOTE — Telephone Encounter (Signed)
Patient was in the office today for repeat BMET Patient requested to speak with nurse for review of her diabetic meds pre-procedure Printed med list for patient and noted which meds to hold and when prior to procedure (metforming 24 hrs prior/48 hrs after; all other diabetic meds hold AM of procedure).  She is aware she may need pre-admit for hydration  She plans to come stay with her friend Jessica Torres Tuesday evening prior to procedure and asked that she be called at his number: [phone]

## 2016-10-28 LAB — BASIC METABOLIC PANEL
BUN / CREAT RATIO: 12 (ref 12–28)
BUN: 16 mg/dL (ref 8–27)
CO2: 23 mmol/L (ref 20–29)
Calcium: 10.2 mg/dL (ref 8.7–10.3)
Chloride: 101 mmol/L (ref 96–106)
Creatinine, Ser: 1.33 mg/dL — ABNORMAL HIGH (ref 0.57–1.00)
GFR calc Af Amer: 46 mL/min/{1.73_m2} — ABNORMAL LOW (ref >59)
GFR calc non Af Amer: 40 mL/min/{1.73_m2} — ABNORMAL LOW (ref >59)
GLUCOSE: 217 mg/dL — AB (ref 65–99)
POTASSIUM: 5.5 mmol/L — AB (ref 3.5–5.2)
SODIUM: 138 mmol/L (ref 134–144)

## 2016-10-29 ENCOUNTER — Telehealth: Payer: Self-pay | Admitting: Cardiovascular Disease

## 2016-10-29 ENCOUNTER — Other Ambulatory Visit: Payer: Self-pay | Admitting: Cardiovascular Disease

## 2016-10-29 DIAGNOSIS — I739 Peripheral vascular disease, unspecified: Secondary | ICD-10-CM

## 2016-10-29 NOTE — Telephone Encounter (Signed)
Left message with Jessica Torres to have pt call back when she is available.

## 2016-10-29 NOTE — Telephone Encounter (Signed)
Left message for pt to inform her that her scheduled procedure time has changed due to Dr. Gwenlyn Found having another procedure. Time has changed to 11:30 AM. Pt will need to arrive at the [facility] Main Entrance A of [place] at 9:30 am.  Told pt to return call if any questions.

## 2016-10-30 ENCOUNTER — Encounter (HOSPITAL_COMMUNITY)
Admission: RE | Disposition: A | Discharge status: Home / Self Care | Payer: Self-pay | Source: Ambulatory Visit | Attending: Cardiovascular Disease

## 2016-10-30 ENCOUNTER — Encounter (HOSPITAL_COMMUNITY): Payer: Self-pay | Admitting: General Practice

## 2016-10-30 ENCOUNTER — Ambulatory Visit (HOSPITAL_COMMUNITY)
Admission: RE | Admit: 2016-10-30 | Discharge: 2016-10-31 | Disposition: A | Discharge status: Home / Self Care | Payer: Medicare HMO | Source: Ambulatory Visit | Attending: Cardiovascular Disease | Admitting: Cardiovascular Disease

## 2016-10-30 DIAGNOSIS — T82856A Stenosis of peripheral vascular stent, initial encounter: Secondary | ICD-10-CM | POA: Diagnosis not present

## 2016-10-30 DIAGNOSIS — D45 Polycythemia vera: Secondary | ICD-10-CM | POA: Diagnosis not present

## 2016-10-30 DIAGNOSIS — Z885 Allergy status to narcotic agent status: Secondary | ICD-10-CM | POA: Insufficient documentation

## 2016-10-30 DIAGNOSIS — Z95820 Peripheral vascular angioplasty status with implants and grafts: Secondary | ICD-10-CM | POA: Diagnosis not present

## 2016-10-30 DIAGNOSIS — I7092 Chronic total occlusion of artery of the extremities: Secondary | ICD-10-CM | POA: Diagnosis not present

## 2016-10-30 DIAGNOSIS — E1165 Type 2 diabetes mellitus with hyperglycemia: Secondary | ICD-10-CM

## 2016-10-30 DIAGNOSIS — IMO0002 Reserved for concepts with insufficient information to code with codable children: Secondary | ICD-10-CM

## 2016-10-30 DIAGNOSIS — Z91040 Latex allergy status: Secondary | ICD-10-CM | POA: Diagnosis not present

## 2016-10-30 DIAGNOSIS — N183 Chronic kidney disease, stage 3 (moderate): Secondary | ICD-10-CM | POA: Diagnosis not present

## 2016-10-30 DIAGNOSIS — I70213 Atherosclerosis of native arteries of extremities with intermittent claudication, bilateral legs: Secondary | ICD-10-CM | POA: Diagnosis not present

## 2016-10-30 DIAGNOSIS — E785 Hyperlipidemia, unspecified: Secondary | ICD-10-CM | POA: Insufficient documentation

## 2016-10-30 DIAGNOSIS — I70211 Atherosclerosis of native arteries of extremities with intermittent claudication, right leg: Secondary | ICD-10-CM | POA: Diagnosis not present

## 2016-10-30 DIAGNOSIS — I129 Hypertensive chronic kidney disease with stage 1 through stage 4 chronic kidney disease, or unspecified chronic kidney disease: Secondary | ICD-10-CM | POA: Insufficient documentation

## 2016-10-30 DIAGNOSIS — E1122 Type 2 diabetes mellitus with diabetic chronic kidney disease: Secondary | ICD-10-CM | POA: Insufficient documentation

## 2016-10-30 DIAGNOSIS — I739 Peripheral vascular disease, unspecified: Secondary | ICD-10-CM | POA: Diagnosis present

## 2016-10-30 DIAGNOSIS — Y838 Other surgical procedures as the cause of abnormal reaction of the patient, or of later complication, without mention of misadventure at the time of the procedure: Secondary | ICD-10-CM | POA: Diagnosis not present

## 2016-10-30 DIAGNOSIS — E1151 Type 2 diabetes mellitus with diabetic peripheral angiopathy without gangrene: Secondary | ICD-10-CM

## 2016-10-30 DIAGNOSIS — Z9862 Peripheral vascular angioplasty status: Secondary | ICD-10-CM

## 2016-10-30 HISTORY — PX: LOWER EXTREMITY ANGIOGRAM: SHX5955

## 2016-10-30 HISTORY — PX: LOWER EXTREMITY ANGIOGRAPHY: CATH118251

## 2016-10-30 HISTORY — DX: Cardiac murmur, unspecified: R01.1

## 2016-10-30 LAB — POCT ACTIVATED CLOTTING TIME
Activated Clotting Time: 252 seconds
Activated Clotting Time: 213 seconds
Activated Clotting Time: 257 seconds
Activated Clotting Time: 164 seconds

## 2016-10-30 LAB — GLUCOSE, CAPILLARY
GLUCOSE-CAPILLARY: 265 mg/dL — AB (ref 65–99)
GLUCOSE-CAPILLARY: 142 mg/dL — AB (ref 65–99)
GLUCOSE-CAPILLARY: 240 mg/dL — AB (ref 65–99)

## 2016-10-30 SURGERY — LOWER EXTREMITY ANGIOGRAPHY
Anesthesia: LOCAL

## 2016-10-30 MED ORDER — SODIUM CHLORIDE 0.9 % WEIGHT BASED INFUSION: 1.0000 mL/kg/h | INTRAVENOUS | Status: DC

## 2016-10-30 MED ORDER — IODIXANOL 320 MG/ML IV SOLN
INTRAVENOUS | Status: DC | PRN
Start: 2016-10-30 — End: 2016-10-30
  Administered 2016-10-30: 175 mL via INTRAVENOUS

## 2016-10-30 MED ORDER — INSULIN ASPART 100 UNIT/ML ~~LOC~~ SOLN
0.0000 [IU] | Freq: Three times a day (TID) | SUBCUTANEOUS | Status: DC
  Administered 2016-10-31: 3 [IU] via SUBCUTANEOUS

## 2016-10-30 MED ORDER — HEPARIN SODIUM (PORCINE) 1000 UNIT/ML IJ SOLN
INTRAMUSCULAR | Status: AC
Start: 2016-10-30 — End: ?
  Filled 2016-10-30: qty 1

## 2016-10-30 MED ORDER — SODIUM CHLORIDE 0.9% FLUSH: 3.0000 mL | INTRAVENOUS | Status: DC | PRN

## 2016-10-30 MED ORDER — INSULIN GLARGINE 100 UNIT/ML ~~LOC~~ SOLN
42.0000 [IU] | SUBCUTANEOUS | Status: DC
  Administered 2016-10-31: 07:00:00 42 [IU] via SUBCUTANEOUS
  Filled 2016-10-30: qty 0.42

## 2016-10-30 MED ORDER — ATORVASTATIN CALCIUM 80 MG PO TABS
80.0000 mg | ORAL_TABLET | Freq: Every day | ORAL | Status: DC
  Administered 2016-10-30 – 2016-10-31 (×2): 80 mg via ORAL
  Filled 2016-10-30 (×2): qty 1

## 2016-10-30 MED ORDER — INSULIN ASPART 100 UNIT/ML ~~LOC~~ SOLN
0.0000 [IU] | Freq: Every day | SUBCUTANEOUS | Status: DC
  Administered 2016-10-30: 2 [IU] via SUBCUTANEOUS

## 2016-10-30 MED ORDER — FENTANYL CITRATE (PF) 100 MCG/2ML IJ SOLN
INTRAMUSCULAR | Status: AC
  Filled 2016-10-30: qty 2

## 2016-10-30 MED ORDER — LABETALOL HCL 5 MG/ML IV SOLN: 10.0000 mg | INTRAVENOUS | Status: DC | PRN

## 2016-10-30 MED ORDER — MIDAZOLAM HCL 2 MG/2ML IJ SOLN
INTRAMUSCULAR | Status: AC
  Filled 2016-10-30: qty 2

## 2016-10-30 MED ORDER — LEVOTHYROXINE SODIUM 88 MCG PO TABS
88.0000 ug | ORAL_TABLET | Freq: Every day | ORAL | Status: DC
  Administered 2016-10-31: 07:00:00 88 ug via ORAL
  Filled 2016-10-30 (×2): qty 1

## 2016-10-30 MED ORDER — HYDRALAZINE HCL 20 MG/ML IJ SOLN: 5.0000 mg | INTRAMUSCULAR | Status: DC | PRN

## 2016-10-30 MED ORDER — ANGIOPLASTY BOOK
Freq: Once | Status: AC
  Administered 2016-10-30: 20:00:00
  Filled 2016-10-30: qty 1

## 2016-10-30 MED ORDER — ONDANSETRON HCL 4 MG/2ML IJ SOLN: 4.0000 mg | Freq: Four times a day (QID) | INTRAMUSCULAR | Status: DC | PRN

## 2016-10-30 MED ORDER — LOSARTAN POTASSIUM-HCTZ 50-12.5 MG PO TABS: 1.0000 | ORAL_TABLET | Freq: Every day | ORAL | Status: DC

## 2016-10-30 MED ORDER — MIDAZOLAM HCL 2 MG/2ML IJ SOLN
INTRAMUSCULAR | Status: DC | PRN
Start: 2016-10-30 — End: 2016-10-30
  Administered 2016-10-30: 1 mg via INTRAVENOUS

## 2016-10-30 MED ORDER — FENTANYL CITRATE (PF) 100 MCG/2ML IJ SOLN
INTRAMUSCULAR | Status: DC | PRN
  Administered 2016-10-30: 25 ug via INTRAVENOUS

## 2016-10-30 MED ORDER — ASPIRIN EC 81 MG PO TBEC
81.0000 mg | DELAYED_RELEASE_TABLET | Freq: Every day | ORAL | Status: DC
  Administered 2016-10-31: 81 mg via ORAL
  Filled 2016-10-30: qty 1

## 2016-10-30 MED ORDER — HEPARIN SODIUM (PORCINE) 1000 UNIT/ML IJ SOLN
INTRAMUSCULAR | Status: AC
  Filled 2016-10-30: qty 1

## 2016-10-30 MED ORDER — HEPARIN (PORCINE) IN NACL 2-0.9 UNIT/ML-% IJ SOLN
INTRAMUSCULAR | Status: DC | PRN
  Administered 2016-10-30: 12:00:00

## 2016-10-30 MED ORDER — HYDRALAZINE HCL 20 MG/ML IJ SOLN: 10.0000 mg | INTRAMUSCULAR | Status: DC | PRN

## 2016-10-30 MED ORDER — SODIUM CHLORIDE 0.9 % WEIGHT BASED INFUSION
3.0000 mL/kg/h | INTRAVENOUS | Status: DC
Start: 2016-10-30 — End: 2016-10-30
  Administered 2016-10-30: 3 mL/kg/h via INTRAVENOUS

## 2016-10-30 MED ORDER — FENOFIBRATE 160 MG PO TABS
160.0000 mg | ORAL_TABLET | Freq: Every day | ORAL | Status: DC
  Administered 2016-10-30 – 2016-10-31 (×2): 160 mg via ORAL
  Filled 2016-10-30 (×2): qty 1

## 2016-10-30 MED ORDER — PANTOPRAZOLE SODIUM 40 MG PO TBEC
40.0000 mg | DELAYED_RELEASE_TABLET | Freq: Every day | ORAL | Status: DC
  Administered 2016-10-31: 10:00:00 40 mg via ORAL
  Filled 2016-10-30 (×2): qty 1

## 2016-10-30 MED ORDER — TRAMADOL HCL 50 MG PO TABS
100.0000 mg | ORAL_TABLET | Freq: Four times a day (QID) | ORAL | Status: DC | PRN
  Administered 2016-10-30: 21:00:00 100 mg via ORAL
  Filled 2016-10-30: qty 2

## 2016-10-30 MED ORDER — HYDROCHLOROTHIAZIDE 12.5 MG PO CAPS
12.5000 mg | ORAL_CAPSULE | Freq: Every day | ORAL | Status: DC
  Administered 2016-10-31: 10:00:00 12.5 mg via ORAL
  Filled 2016-10-30 (×2): qty 1

## 2016-10-30 MED ORDER — SODIUM CHLORIDE 0.9 % IV SOLN: INTRAVENOUS | Status: AC

## 2016-10-30 MED ORDER — ASPIRIN EC 81 MG PO TBEC: 81.0000 mg | DELAYED_RELEASE_TABLET | Freq: Every day | ORAL | Status: DC

## 2016-10-30 MED ORDER — ACETAMINOPHEN 325 MG PO TABS
650.0000 mg | ORAL_TABLET | ORAL | Status: DC | PRN
  Filled 2016-10-30: qty 2

## 2016-10-30 MED ORDER — SODIUM CHLORIDE 0.9 % IV SOLN: 250.0000 mL | INTRAVENOUS | Status: DC | PRN

## 2016-10-30 MED ORDER — SODIUM CHLORIDE 0.9% FLUSH: 3.0000 mL | Freq: Two times a day (BID) | INTRAVENOUS | Status: DC

## 2016-10-30 MED ORDER — ASPIRIN 81 MG PO CHEW: 81.0000 mg | CHEWABLE_TABLET | ORAL | Status: DC

## 2016-10-30 MED ORDER — ASPIRIN 81 MG PO CHEW
81.0000 mg | CHEWABLE_TABLET | ORAL | Status: DC
Start: 2016-10-31 — End: 2016-10-30

## 2016-10-30 MED ORDER — LOSARTAN POTASSIUM 50 MG PO TABS
50.0000 mg | ORAL_TABLET | Freq: Every day | ORAL | Status: DC
  Administered 2016-10-31: 10:00:00 50 mg via ORAL
  Filled 2016-10-30 (×2): qty 1

## 2016-10-30 MED ORDER — HEPARIN (PORCINE) IN NACL 2-0.9 UNIT/ML-% IJ SOLN
INTRAMUSCULAR | Status: AC
  Filled 2016-10-30: qty 1000

## 2016-10-30 MED ORDER — HEPARIN SODIUM (PORCINE) 1000 UNIT/ML IJ SOLN
INTRAMUSCULAR | Status: DC | PRN
Start: 2016-10-30 — End: 2016-10-30
  Administered 2016-10-30: 8000 [IU] via INTRAVENOUS
  Administered 2016-10-30: 2500 [IU] via INTRAVENOUS

## 2016-10-30 MED ORDER — LIDOCAINE HCL (PF) 1 % IJ SOLN
INTRAMUSCULAR | Status: AC
Start: 2016-10-30 — End: ?
  Filled 2016-10-30: qty 30

## 2016-10-30 MED ORDER — CLOPIDOGREL BISULFATE 75 MG PO TABS: 75.0000 mg | ORAL_TABLET | Freq: Every day | ORAL | Status: DC

## 2016-10-30 MED ORDER — LIDOCAINE HCL (PF) 1 % IJ SOLN
INTRAMUSCULAR | Status: DC | PRN
  Administered 2016-10-30: 20 mL

## 2016-10-30 MED ORDER — CLOPIDOGREL BISULFATE 75 MG PO TABS
75.0000 mg | ORAL_TABLET | Freq: Every day | ORAL | Status: DC
  Administered 2016-10-31: 10:00:00 75 mg via ORAL
  Filled 2016-10-30: qty 1

## 2016-10-30 SURGICAL SUPPLY — 31 items
BAG SNAP BAND KOVER 36X36 (MISCELLANEOUS) ×2 IMPLANT
BALLN COYOTE OTW 2.5X100X150 (BALLOONS) ×2
BALLN IN.PACT DCB 5X120 (BALLOONS) ×2
BALLN IN.PACT DCB 5X150 (BALLOONS) ×2
BALLOON COYOTE OTW 2.5X100X150 (BALLOONS) ×1 IMPLANT
CATH ANGIO 5F PIGTAIL 65CM (CATHETERS) ×2 IMPLANT
CATH CROSS OVER TEMPO 5F (CATHETERS) ×2 IMPLANT
CATH HAWKONE LX EXTENDED TIP (CATHETERS) ×2 IMPLANT
CATH VIANCE CROSS STAND 150CM (MICROCATHETER) ×2
CATH VIANCE CROSS STD 150CM (MICROCATHETER) ×1 IMPLANT
COVER PRB 48X5XTLSCP FOLD TPE (BAG) ×1 IMPLANT
COVER PROBE 5X48 (BAG) ×1
DCB IN.PACT 5X120 (BALLOONS) ×1 IMPLANT
DCB IN.PACT 5X150 (BALLOONS) ×1 IMPLANT
DEVICE CONTINUOUS FLUSH (MISCELLANEOUS) ×2 IMPLANT
DEVICE SPIDERFX EMB PROT 6MM (WIRE) ×4 IMPLANT
DEVICE TORQUE .014-.018 (MISCELLANEOUS) ×1 IMPLANT
KIT ENCORE 26 ADVANTAGE (KITS) ×2 IMPLANT
KIT PV (KITS) ×2 IMPLANT
SHEATH HIGHFLEX ANSEL 7FR 55CM (SHEATH) ×2 IMPLANT
SHEATH PINNACLE 5F 10CM (SHEATH) ×2 IMPLANT
SHEATH PINNACLE 7F 10CM (SHEATH) ×2 IMPLANT
STOPCOCK MORSE 400PSI 3WAY (MISCELLANEOUS) ×2 IMPLANT
SYRINGE MEDRAD AVANTA MACH 7 (SYRINGE) ×2 IMPLANT
TAPE VIPERTRACK RADIOPAQ (MISCELLANEOUS) ×1 IMPLANT
TAPE VIPERTRACK RADIOPAQUE (MISCELLANEOUS) ×1
TORQUE DEVICE .014-.018 (MISCELLANEOUS) ×2
TRANSDUCER W/STOPCOCK (MISCELLANEOUS) ×2 IMPLANT
TRAY PV CATH (CUSTOM PROCEDURE TRAY) ×2 IMPLANT
WIRE HITORQ VERSACORE ST 145CM (WIRE) ×2 IMPLANT
WIRE SPARTACORE .014X300CM (WIRE) ×2 IMPLANT

## 2016-10-30 NOTE — Care Management Note (Signed)
Case Management Note  Patient Details  Name: Jessica Torres MRN: [id number] Date of Birth: [birth year]  Subjective/Objective:    From home,s/p  successful right SFA directional atherectomy , will be on plavix and asa. For dc today, no needs.                Action/Plan:   Expected Discharge Date:  10/31/16               Expected Discharge Plan:  Home/Self Care  In-House Referral:     Discharge planning Services  CM Consult  Post Acute Care Choice:    Choice offered to:     DME Arranged:    DME Agency:     HH Arranged:    HH Agency:     Status of Service:  Completed, signed off  If discussed at [employer] of Stay Meetings, dates discussed:    Additional Comments:  Zenon Mayo, RN 10/30/2016, 3:37 PM

## 2016-10-30 NOTE — Progress Notes (Addendum)
Site area: Left groin  Site Prior to Removal:  Level 0  Pressure Applied For 20 MINUTES    Minutes Beginning at 1540  Manual:   Yes.    Patient Status During Pull:  AAO X4  Post Pull Groin Site:  Level 0  Post Pull Instructions Given:  Yes.    Post Pull Pulses Present:  Yes.    Dressing Applied:  Yes.    Comments:  TOLERATED PROCEDURE WELL

## 2016-10-31 ENCOUNTER — Encounter (HOSPITAL_COMMUNITY): Payer: Self-pay | Admitting: Cardiovascular Disease

## 2016-10-31 DIAGNOSIS — I739 Peripheral vascular disease, unspecified: Secondary | ICD-10-CM

## 2016-10-31 DIAGNOSIS — I70213 Atherosclerosis of native arteries of extremities with intermittent claudication, bilateral legs: Secondary | ICD-10-CM | POA: Diagnosis not present

## 2016-10-31 LAB — BASIC METABOLIC PANEL
ANION GAP: 9 (ref 5–15)
BUN: 18 mg/dL (ref 6–20)
CO2: 23 mmol/L (ref 22–32)
Calcium: 9.4 mg/dL (ref 8.9–10.3)
Chloride: 101 mmol/L (ref 101–111)
Creatinine, Ser: 1.38 mg/dL — ABNORMAL HIGH (ref 0.44–1.00)
GFR, EST AFRICAN AMERICAN: 43 mL/min — AB (ref >60)
GFR, EST NON AFRICAN AMERICAN: 37 mL/min — AB (ref >60)
GLUCOSE: 231 mg/dL — AB (ref 65–99)
POTASSIUM: 4.1 mmol/L (ref 3.5–5.1)
Sodium: 133 mmol/L — ABNORMAL LOW (ref 135–145)

## 2016-10-31 LAB — CBC
HEMATOCRIT: 35.1 % — AB (ref 36.0–46.0)
HEMOGLOBIN: 11.2 g/dL — AB (ref 12.0–15.0)
MCH: 27.8 pg (ref 26.0–34.0)
MCHC: 31.9 g/dL (ref 30.0–36.0)
MCV: 87.1 fL (ref 78.0–100.0)
Platelets: 325 10*3/uL (ref 150–400)
RBC: 4.03 MIL/uL (ref 3.87–5.11)
RDW: 14.2 % (ref 11.5–15.5)
WBC: 7.4 10*3/uL (ref 4.0–10.5)

## 2016-10-31 LAB — GLUCOSE, CAPILLARY: Glucose-Capillary: 240 mg/dL — ABNORMAL HIGH (ref 65–99)

## 2016-10-31 MED ORDER — OXYCODONE-ACETAMINOPHEN 5-325 MG PO TABS
1.0000 | ORAL_TABLET | Freq: Four times a day (QID) | ORAL | Status: DC | PRN
  Administered 2016-10-31: 1 via ORAL
  Filled 2016-10-31: qty 1

## 2016-10-31 MED ORDER — LIVING WELL WITH DIABETES BOOK
Freq: Once | Status: AC
  Administered 2016-10-31: 10:00:00
  Filled 2016-10-31: qty 1

## 2016-10-31 NOTE — Progress Notes (Signed)
Progress Note  Patient Name: Jessica Torres Date of Encounter: 10/31/2016  Primary Cardiologist: Dr. Gwenlyn Found   Subjective   Feeling well. No chest pain, sob or palpitations. Mild dizziness this morning, improving.   Inpatient Medications    Scheduled Meds: . aspirin EC  81 mg Oral Daily  . atorvastatin  80 mg Oral Daily  . clopidogrel  75 mg Oral Daily  . fenofibrate  160 mg Oral Daily  . losartan  50 mg Oral Daily   And  . hydrochlorothiazide  12.5 mg Oral Daily  . insulin aspart  0-5 Units Subcutaneous QHS  . insulin aspart  0-9 Units Subcutaneous TID WC  . insulin glargine  42 Units Subcutaneous BH-q7a  . levothyroxine  88 mcg Oral QAC breakfast  . pantoprazole  40 mg Oral Daily  . sodium chloride flush  3 mL Intravenous Q12H   Continuous Infusions: . sodium chloride     PRN Meds: sodium chloride, acetaminophen, hydrALAZINE, hydrALAZINE, labetalol, ondansetron (ZOFRAN) IV, oxyCODONE-acetaminophen, sodium chloride flush, traMADol   Vital Signs    Vitals:   10/30/16 1911 10/30/16 2000 10/31/16 0330 10/31/16 0751  BP: (!) 95/39 (!) 115/46 (!) 122/50 (!) 108/37  Pulse: 84 73  68  Resp: 14 12 15 12   Temp: 98.6 F (37 C)  (!) 97.5 F (36.4 C) 97.6 F (36.4 C)  TempSrc: Oral  Oral Oral  SpO2: 98% 98% 99% 96%  Weight:      Height:        Intake/Output Summary (Last 24 hours) at 10/31/16 0817 Last data filed at 10/31/16 0300  Gross per 24 hour  Intake              825 ml  Output             1125 ml  Net             -300 ml   Filed Weights   10/30/16 0936  Weight: 147 lb (66.7 kg)    Telemetry    SR with rate mostly in 60, intermittently goes to upper 100s. PACs  ECG    None today   Physical Exam   GEN: No acute distress.   Neck: No JVD Cardiac: RRR, no murmurs, rubs, or gallops. L groin cath site without hematoma Respiratory: Clear to auscultation bilaterally. GI: Soft, nontender, non-distended  MS: No edema; No deformity. Neuro:  Nonfocal    Psych: Normal affect   Labs    Chemistry Recent Labs Lab 10/27/16 1229 10/31/16 0536  NA 138 133*  K 5.5* 4.1  CL 101 101  CO2 23 23  GLUCOSE 217* 231*  BUN 16 18  CREATININE 1.33* 1.38*  CALCIUM 10.2 9.4  GFRNONAA 40* 37*  GFRAA 46* 43*  ANIONGAP  --  9     Hematology Recent Labs Lab 10/31/16 0536  WBC 7.4  RBC 4.03  HGB 11.2*  HCT 35.1*  MCV 87.1  MCH 27.8  MCHC 31.9  RDW 14.2  PLT 325    Cardiac EnzymesNo results for input(s): TROPONINI in the last 168 hours. No results for input(s): TROPIPOC in the last 168 hours.   BNPNo results for input(s): BNP, PROBNP in the last 168 hours.   DDimer No results for input(s): DDIMER in the last 168 hours.   Radiology    No results found.  Cardiac Studies   PV Angiogram/Intervention  Procedures Performed:            1. Abdominal  aortogram/bilateral iliac angiogram/bifemoral runoff            2. Contralateral access (second order catheter placement)            3. Placement of spider distal protection device in the right above-the-knee popliteal artery            4. Hawk 1 directional atherectomy followed by drug-eluting balloon angioplasty of the right SFA chronic total occlusion Angiographic Data:   1: Abdominal aorta-distal abdominal aorta was free of significant atherosclerotic changes 2: Left lower extremity-the left SFA ViaBahn stent was occluded. The distal SFA/popliteal artery filled by profunda femoris collaterals. There was three-vessel runoff. 3: Right lower extremity-there is a moderately long segment mid right SFA CTO with three-vessel runoff. The proximal third of the right SFA had 50-75% stenosis.   IMPRESSION: Jessica Torres has occluded SFAs bilaterally the left being a Viabahn covered stent previously placed, and the right being a previously atherectomized segment. We will proceed with right SFA Hawk 1 directional atherectomy followed by drug-eluting balloon angioplasty distal protection.   Final  Impression: Occluded bilateral SFAs status post successful right SFA directional atherectomy followed by drug-eluting balloon plasty using distal protection resulting in reduction with total occlusion to 0% residual with a small nonflow limiting linear dissection. The patient how the procedure well. She'll be hydrated overnight. The sheath will be removed once the ACT falls below 170 pressure held. She will be discharged home in the morning on aspirin and Plavix and obtain lower extremity arterial Doppler studies and her [place] office next week. I will see her back one to 2 weeks thereafter. She will likely need left femoropopliteal bypass grafting.   Patient Profile     76 y.o. female with hx of HTN, HLD, DM, CKD stage III  and PAD (07/06/14 -->right SFA directional atherectomy, drug-eluting balloon angioplasty;  08/16/13 --> directional atherectomy, stenting using Viabahn cover stent; 01/22/2015--> [facility] 1 directional atherectomy, and drug-eluting balloon angioplasty of her right SFA stenosis) presented for scheduled PV angiogram.   Recently right leg has had recurrent symptoms in both feet and legs are now numb with bilateral claudication. Recent Dopplers performed 10/03/16 revealed a right ABI 0.73 with an occluded mid right SFA. Presented for  reinterviewed and on for lifestyle limiting claudication.  Assessment & Plan    1. PAD - bilateral SFA - PV angiogram showed occluded bilateral SFAs s/p  successful right SFA directional atherectomy followed by drug-eluting balloon plasty using distal protection resulting in reduction with total occlusion to 0% residual with a small nonflow limiting linear dissection. - Continue ASA and plavix. Outpatient lower extremity arterial Doppler studies and f.u with Dr. Gwenlyn Found. She will likely need left femoropopliteal bypass grafting.  2. HLD - hx of statin intolerance. However patient states that she is taking Atorvastatin 80mg  qd. Advised to bring lipid panel  result during follow up with medications.   3. HTN - Relatively stable.   Signed, Leanor Kail, PA  10/31/2016, 8:17 AM    Patient seen, examined. Available data reviewed. Agree with findings, assessment, and plan as outlined by Robbie Lis, PA-C. The patient is comfortable, lying in bed. Her left groin site is clear. Both feet are warm and well perfused. There is no edema appreciable. There is a 3/6 systolic murmur at the right upper sternal border. Lung fields are clear. Abdomen is soft and nontender.  Interventional note from yesterday is reviewed. The patient underwent directional atherectomy and drug-eluting balloon angioplasty of the right SFA without complication.  She will continue on aspirin and Plavix. She will follow-up with Dr. Gwenlyn Found to discuss revascularization of the left leg.  Sherren Mocha, M.D. 10/31/2016 9:05 AM

## 2016-10-31 NOTE — Discharge Summary (Signed)
Discharge Summary    Patient ID: Jessica Torres,  MRN: [id number], DOB/AGE: 1940/04/14 76 y.o.  Admit date: 10/30/2016 Discharge date: 10/31/2016  Primary Care Provider: Bridget Hartshorn Primary Cardiologist: Dr. Gwenlyn Found   Discharge Diagnoses    Active Problems:   S/P -Billat SFA PTA with restenosis    Claudication in peripheral vascular disease (HCC)   HLD    HTN  Allergies Allergies  Allergen Reactions  . Morphine And Related Swelling and Other (See Comments)    Facial swelling  . Latex Rash  . Statins Other (See Comments)    Myalgias     Diagnostic Studies/Procedures    PV Angiogram/Intervention  Procedures Performed: 1. Abdominal aortogram/bilateral iliac angiogram/bifemoral runoff 2. Contralateral access (second order catheter placement) 3. Placement of spider distal protection device in the right above-the-knee popliteal artery 4. Hawk 1 directional atherectomy followed by drug-eluting balloon angioplasty of the right SFA chronic total occlusion Angiographic Data:   1: Abdominal aorta-distal abdominal aorta was free of significant atherosclerotic changes 2: Left lower extremity-the left SFA ViaBahn stent was occluded. The distal SFA/popliteal artery filled by profunda femoris collaterals. There was three-vessel runoff. 3: Right lower extremity-there is a moderately long segment mid right SFA CTO with three-vessel runoff. The proximal third of the right SFA had 50-75% stenosis.   IMPRESSION:Jessica Torres has occluded SFAs bilaterally the left being a Viabahn covered stent previously placed, and the right being a previously atherectomized segment. We will proceed with right SFA Hawk 1 directional atherectomy followed by drug-eluting balloon angioplasty distal protection.   Final Impression:Occluded bilateral SFAs status post successful right SFA directional atherectomy followed by drug-eluting balloon plasty  using distal protection resulting in reduction with total occlusion to 0% residual with a small nonflow limiting linear dissection. The patient how the procedure well. She'll be hydrated overnight. The sheath will be removed once the ACT falls below 170 pressure held. She will be discharged home in the morning on aspirin and Plavix and obtain lower extremity arterial Doppler studies and her [place] office next week. I will see her back one to 2 weeks thereafter. She will likely need left femoropopliteal bypass grafting.   History of Present Illness     76 y.o. female with hx of HTN, HLD, DM, CKD stage III  and PAD (07/06/14 -->right SFA directional atherectomy, drug-eluting balloon angioplasty;  08/16/13 --> directional atherectomy, stenting using Viabahn cover stent; 01/22/2015--> [facility] 1 directional atherectomy, and drug-eluting balloon angioplasty of her right SFA stenosis) presented for scheduled PV angiogram.   Recently right leg has had recurrent symptoms in both feet and legs are now numb with bilateral claudication. Recent Dopplers performed 10/03/16 revealed a right ABI 0.73 with an occluded mid right SFA. Presented for reinterviewed and on for lifestyle limiting claudication.  Hospital Course     Consultants: None   1. PAD - bilateral SFA - PV angiogram showed occluded bilateral SFAs s/p  successful right SFA directional atherectomy followed by drug-eluting balloon plasty using distal protection resulting in reduction with total occlusion to 0% residual with a small nonflow limiting linear dissection. - Continue ASA and plavix. Outpatient lower extremity arterial Doppler studies and f.u with Dr. Gwenlyn Found. She will likely need left femoropopliteal bypass grafting.  2. HLD - hx of statin intolerance. However patient states that she is taking Atorvastatin 80mg  qd. Advised to bring lipid panel result during follow up with medications.   3. HTN - Relatively stable.   The patient has been  seen by Dr. Burt Knack  today and deemed ready for discharge home. All follow-up appointments have been scheduled. Discharge medications are listed below.   Discharge Vitals Blood pressure (!) 108/37, pulse 68, temperature 97.6 F (36.4 C), temperature source Oral, resp. rate 12, height 5\' 5"  (1.651 m), weight 147 lb (66.7 kg), SpO2 96 %.  Filed Weights   10/30/16 0936  Weight: 147 lb (66.7 kg)    Labs & Radiologic Studies     CBC  Recent Labs  10/31/16 0536  WBC 7.4  HGB 11.2*  HCT 35.1*  MCV 87.1  PLT 774   Basic Metabolic Panel  Recent Labs  10/31/16 0536  NA 133*  K 4.1  CL 101  CO2 23  GLUCOSE 231*  BUN 18  CREATININE 1.38*  CALCIUM 9.4    Dg Chest 2 View  Result Date: 10/20/2016 CLINICAL DATA:  Preop study.  Hypertension. EXAM: CHEST  2 VIEW COMPARISON:  06/29/2014 FINDINGS: Heart and mediastinal contours are within normal limits. No focal opacities or effusions. No acute bony abnormality. IMPRESSION: No active cardiopulmonary disease. Electronically Signed   By: Rolm Baptise M.D.   On: 10/20/2016 13:23   Mm Screening Breast Tomo Bilateral  Result Date: 10/27/2016 CLINICAL DATA:  Screening. EXAM: 2D DIGITAL SCREENING BILATERAL MAMMOGRAM WITH CAD AND ADJUNCT TOMO COMPARISON:  Previous exam(s). ACR Breast Density Category c: The breast tissue is heterogeneously dense, which may obscure small masses. FINDINGS: There are no findings suspicious for malignancy. Images were processed with CAD. IMPRESSION: No mammographic evidence of malignancy. A result letter of this screening mammogram will be mailed directly to the patient. RECOMMENDATION: Screening mammogram in one year. (Code:SM-B-01Y) BI-RADS CATEGORY  1: Negative. Electronically Signed   By: Franki Cabot M.D.   On: 10/27/2016 16:59    Disposition   Pt is being discharged home today in good condition.  Follow-up Plans & Appointments    Follow-up Information    [facility]. Go on 11/04/2016.     Specialty:  Cardiology Why:  @12 :30 for LE arterial doppler. Please arrive 15 minutes early Contact information: [address] [place]       Lorretta Harp, MD. Go on 11/25/2016.   Specialties:  Cardiology, Radiology Why:  @11am  for post hospital follow up  Contact information: [address] [place] [address] [phone]          Discharge Instructions    Diet - low sodium heart healthy    Complete by:  As directed    Discharge instructions    Complete by:  As directed    No driving for 48 hours. No lifting over 5 lbs for 1 week. No sexual activity for 1 week.  Keep procedure site clean & dry. If you notice increased pain, swelling, bleeding or pus, call/return!  You may shower, but no soaking baths/hot tubs/pools for 1 week.   Hold metformin today and tomorrow. Resume Sunday.   Increase activity slowly    Complete by:  As directed       Discharge Medications   Current Discharge Medication List    CONTINUE these medications which have NOT CHANGED   Details  aspirin EC 81 MG tablet Take 81 mg by mouth daily.    atorvastatin (LIPITOR) 80 MG tablet Take 80 mg by mouth daily.    clopidogrel (PLAVIX) 75 MG tablet Take 75 mg by mouth daily.     ergocalciferol (VITAMIN D2) 50000 units  capsule Take 50,000 Units by mouth once a week. No specific day    fenofibrate 160 MG tablet Take 160 mg by mouth daily.    glimepiride (AMARYL) 4 MG tablet Take 4 mg by mouth daily with breakfast.     Insulin Glargine (TOUJEO MAX SOLOSTAR) 300 UNIT/ML SOPN Inject 42 Units into the skin every morning.     insulin lispro (HUMALOG) 100 UNIT/ML injection Inject 12 Units into the skin 3 (three) times daily before meals.     levothyroxine (SYNTHROID, LEVOTHROID) 88 MCG tablet Take 88 mcg by mouth daily before breakfast.     losartan-hydrochlorothiazide (HYZAAR) 50-12.5 MG per tablet Take 1 tablet by mouth daily.     metFORMIN (GLUCOPHAGE) 1000 MG tablet Take 1 tablet (1,000 mg total) by mouth 2 (two) times daily with a meal.    pantoprazole (PROTONIX) 40 MG tablet Take 40 mg by mouth daily.          Outstanding Labs/Studies   LE arterial doppler   Duration of Discharge Encounter   Greater than 30 minutes including physician time.  Signed, Cory Rama PA-C 10/31/2016, 9:31 AM

## 2016-10-31 NOTE — Progress Notes (Signed)
Inpatient Diabetes Program Recommendations  AACE/ADA: New Consensus Statement on Inpatient Glycemic Control (2015)  Target Ranges:  Prepandial:   less than 140 mg/dL      Peak postprandial:   less than 180 mg/dL (1-2 hours)      Critically ill patients:  140 - 180 mg/dL   Lab Results  Component Value Date   GLUCAP 240 (H) 10/31/2016   HGBA1C 7.9 (H) 08/18/2014    Review of Glycemic Control  Spoke with pt regarding her diabetes management. Pt is frustrated that her blood sugars always seem to be out of control. Pt has not been checking blood sugars lately. PCP manages her diabetes. Discussed diet, exercise and stress management all play a role in managing glycemic control. Discussed hypoglycemia s/s and treatment, decreasing food portions, eating low carbs, and exercising a little bit every day. Pt willing to go to OP Diabetes Education. Ordered. Instructed to f/u with PCP and take logbook to appt for any needed insulin adjustments. Instructed to check blood sugars when she wakes up, before lunch, occasionally 2 hours after meals, dinner, and HS. To record blood sugars and make notes regarding food and insulin given.  Pt and husband voiced understanding. Pt appears very motivated to make lifestyle changes to control her blood sugars.  Discussed above with RN.  Thank you. Lorenda Peck, RD, LDN, CDE Inpatient Diabetes Coordinator [phone]

## 2016-11-04 ENCOUNTER — Ambulatory Visit (HOSPITAL_COMMUNITY)
Admission: RE | Admit: 2016-11-04 | Discharge: 2016-11-04 | Disposition: A | Discharge status: Home / Self Care | Payer: Medicare HMO | Source: Ambulatory Visit | Attending: Cardiovascular Disease | Admitting: Cardiovascular Disease

## 2016-11-04 DIAGNOSIS — R9439 Abnormal result of other cardiovascular function study: Secondary | ICD-10-CM | POA: Diagnosis not present

## 2016-11-04 DIAGNOSIS — Z9889 Other specified postprocedural states: Secondary | ICD-10-CM | POA: Diagnosis not present

## 2016-11-04 DIAGNOSIS — I739 Peripheral vascular disease, unspecified: Secondary | ICD-10-CM | POA: Diagnosis not present

## 2016-11-04 DIAGNOSIS — I10 Essential (primary) hypertension: Secondary | ICD-10-CM

## 2016-11-11 ENCOUNTER — Other Ambulatory Visit: Payer: Self-pay | Admitting: Cardiovascular Disease

## 2016-11-11 DIAGNOSIS — I739 Peripheral vascular disease, unspecified: Secondary | ICD-10-CM

## 2016-11-25 ENCOUNTER — Ambulatory Visit (INDEPENDENT_AMBULATORY_CARE_PROVIDER_SITE_OTHER): Payer: Medicare HMO | Admitting: Cardiovascular Disease

## 2016-11-25 ENCOUNTER — Encounter: Payer: Self-pay | Admitting: Cardiovascular Disease

## 2016-11-25 VITALS — BP 126/76 | HR 80 | Ht 65.0 in | Wt 145.5 lb

## 2016-11-25 DIAGNOSIS — E7800 Pure hypercholesterolemia, unspecified: Secondary | ICD-10-CM

## 2016-11-25 DIAGNOSIS — I779 Disorder of arteries and arterioles, unspecified: Secondary | ICD-10-CM

## 2016-11-25 DIAGNOSIS — I739 Peripheral vascular disease, unspecified: Secondary | ICD-10-CM

## 2016-11-25 NOTE — Progress Notes (Signed)
11/25/2016 Jessica Torres   11-26-1940  [id number]  Primary Physician Bridget Hartshorn, NP Primary Cardiologist: Lorretta Harp MD [name], [place]  HPI:  Jessica Torres is a 76 y.o. female. female  female referred to me by Dr. Paulla Dolly, her podiatrist, for evaluation of peripheral vascular disease. I last saw her in the office 10/08/16.Marland KitchenHer cardiac risk factor profile is notable for treated hypertension and diabetes. She does have hyperlipidemia but is statin intolerant. 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 She's never had a heart attack. She denies chest pain. Has had occ shortness of breath now. She's noticed bilateral lower extremity lifestyle limiting claudication over the last several months. Recent lower extremity arterial Doppler studies performed 06/19/14 revealed ABIs in the mid 0.5 range bilaterally with occluded SFAs. She was admitted 07/06/14 for lower extremity angiography and percutaneous intervention for lifestyle limiting claudication. I performed directional atherectomy followed by drug-eluting balloon angioplasty.She has residual disease on the Lt SFA .   She had staged Bilateral lower extremity interventions on 07/06/14 (right SFA directional atherectomy, drug-eluting balloon angioplasty), 08/16/13 (directional atherectomy, stenting using Viabahn cover stent). Her ABIs improved from the 0.5 range bilaterally to normal. Her claudication has resolved as well. She's had recurrent claudication over the last several weeks to months with recent lower showed a Doppler study performed 12/29/14 revealing recurrent disease in both SFAs. I angiogram to her on 01/11/15 revealing a 95% "restenosis" in the previously treated right SFA and a totally occluded left SFA Viabahn stent. sshe underwent Hawk 1 directional atherectomy, and drug-eluting balloon angioplasty of her right SFA stenosis with an excellent angiographic result. Dopplers performed on 02/05/15  revealed an normal right ABI with widely patent SFA and left ABI of 0.62.  Since I saw her in the office a year and a half ago she says that her right leg has had recurrent symptoms in both feet and legs are now numb with bilateral claudication. Recent Dopplers performed 10/03/16 revealed right ABI 0.73 and a left of 0.56. Her mid right SFA appears to be occluded. I performed peripheral angiography on her 10/30/16 revealing an occluded left SFA Viabahn covered stent and occluded right SFA. I performed one directional atherectomy followed by drug-eluting balloon angioplasty of the right SFA resulting in excellent angiographic result. Her post procedure Dopplers performed on 10/27/16 revealed an increase in her right ABI from 0.73 up to 1. Her left ABI is 0.42. She is significantly symptomatically on the left side. 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  Current Meds  Medication Sig  . aspirin EC 81 MG tablet Take 81 mg by mouth daily.  Marland Kitchen atorvastatin (LIPITOR) 80 MG tablet Take 80 mg by mouth daily.  . clopidogrel (PLAVIX) 75 MG tablet Take 75 mg by mouth daily.   . ergocalciferol (VITAMIN D2) 50000 units capsule Take 50,000 Units by mouth once a week. No specific day  . fenofibrate 160 MG tablet Take 160 mg by mouth daily.  Marland Kitchen glimepiride (AMARYL) 4 MG tablet Take 4 mg by mouth daily with breakfast.   . Insulin Glargine (TOUJEO MAX SOLOSTAR) 300 UNIT/ML SOPN Inject 42 Units into the skin every morning.   . insulin lispro (HUMALOG) 100 UNIT/ML injection Inject 12 Units into the skin 3 (three) times daily before meals.   Marland Kitchen levothyroxine (SYNTHROID, LEVOTHROID) 88 MCG tablet Take 88 mcg by mouth daily before breakfast.   . losartan-hydrochlorothiazide (HYZAAR) 50-12.5 MG per tablet Take  1 tablet by mouth daily.  . metFORMIN (GLUCOPHAGE) 1000 MG tablet Take 1 tablet (1,000 mg total) by mouth 2 (two) times daily with a meal.  . pantoprazole (PROTONIX) 40 MG tablet Take 40 mg by mouth daily.       Allergies  Allergen Reactions  . Morphine And Related Swelling and Other (See Comments)    Facial swelling  . Latex Rash  . Statins Other (See Comments)    Myalgias     Social History   Social History  . Marital status: Divorced    Spouse name: N/A  . Number of children: N/A  . Years of education: N/A   Occupational History  . Not on file.   Social History Main Topics  . Smoking status: Never Smoker  . Smokeless tobacco: Never Used  . Alcohol use 0.0 oz/week     Comment: 10/30/2016  "margarita q  2 wks or so"  . Drug use: No  . Sexual activity: No   Other Topics Concern  . Not on file   Social History Narrative  . No narrative on file     Review of Systems: General: negative for chills, fever, night sweats or weight changes.  Cardiovascular: negative for chest pain, dyspnea on exertion, edema, orthopnea, palpitations, paroxysmal nocturnal dyspnea or shortness of breath Dermatological: negative for rash Respiratory: negative for cough or wheezing Urologic: negative for hematuria Abdominal: negative for nausea, vomiting, diarrhea, bright red blood per rectum, melena, or hematemesis Neurologic: negative for visual changes, syncope, or dizziness All other systems reviewed and are otherwise negative except as noted above.    Blood pressure 126/76, pulse 80, height 5\' 5"  (1.651 m), weight 145 lb 8 oz (66 kg).  General appearance: alert and no distress Neck: no adenopathy, no JVD, supple, symmetrical, trachea midline, thyroid not enlarged, symmetric, no tenderness/mass/nodules and Soft right carotid bruit Lungs: clear to auscultation bilaterally Heart: regular rate and rhythm, S1, S2 normal, no murmur, click, rub or gallop Extremities: Palpable right pedal pulses Pulses: Absent left pedal pulses, palpable right pedal pulses. Skin: Skin color, texture, turgor normal. No rashes or lesions Neurologic: Alert and oriented X 3, normal strength and tone. Normal  symmetric reflexes. Normal coordination and gait  EKG not performed today  ASSESSMENT AND PLAN:   Peripheral arterial disease - Bilateral SFA  [name] returns today after her recent lower extremity angiogram angiogram and intervention performed by myself on 10/30/16. She has not occluded left SFA Viabahn  graft and occluded right SFA which I performed one directional atherectomy followed by drug-eluting balloon angioplasty. Her claudication symptoms are somewhat improved although she does have numb toes consistent with diabetic peripheral neuropathy. Her preintervention right ABI was 0.73 which improved to 1.0 and a velocities are normal. I think she will be left femoropopliteal bypass grafting. Go to refer her to Dr. Trula Slade for consideration of this. I will recheck lower extremity arterial Doppler studies in 6 months and see her back after that for further evaluation.  Essential hypertension History of essential hypertension blood pressure measured today at 126/76. She is on losartan and hydrochlorothiazide. Continue current meds at current dosing  Hyperlipidemia-statin intolerant History of hyperlipidemia on statin therapy followed by her PCP      Lorretta Harp MD [facility], [facility] 11/25/2016 11:26 AM

## 2016-11-25 NOTE — Assessment & Plan Note (Signed)
Jessica Torres returns today after her recent lower extremity angiogram angiogram and intervention performed by myself on 10/30/16. She has not occluded left SFA Viabahn  graft and occluded right SFA which I performed one directional atherectomy followed by drug-eluting balloon angioplasty. Her claudication symptoms are somewhat improved although she does have numb toes consistent with diabetic peripheral neuropathy. Her preintervention right ABI was 0.73 which improved to 1.0 and a velocities are normal. I think she will be left femoropopliteal bypass grafting. Go to refer her to Dr. Trula Slade for consideration of this. I will recheck lower extremity arterial Doppler studies in 6 months and see her back after that for further evaluation.

## 2016-11-25 NOTE — Assessment & Plan Note (Signed)
History of essential hypertension blood pressure measured today at 126/76. She is on losartan and hydrochlorothiazide. Continue current meds at current dosing

## 2016-11-25 NOTE — Assessment & Plan Note (Signed)
History of hyperlipidemia on statin therapy followed by her PCP. 

## 2016-11-25 NOTE — Patient Instructions (Addendum)
Medication Instructions: Your physician recommends that you continue on your current medications as directed. Please refer to the Current Medication list given to you today.  Labwork: Your physician recommends that you return for a FASTING lipid profile and hepatic function panel.   Testing/Procedures: Your physician has requested that you have a carotid duplex--in 1 week. This test is an ultrasound of the carotid arteries in your neck. It looks at blood flow through these arteries that supply the brain with blood. Allow one hour for this exam. There are no restrictions or special instructions.  Your physician has requested that you have a lower extremity arterial duplex. During this test, ultrasound is used to evaluate arterial blood flow in the legs. Allow one hour for this exam. There are no restrictions or special instructions.  Your physician has requested that you have an ankle brachial index (ABI). During this test an ultrasound and blood pressure cuff are used to evaluate the arteries that supply the arms and legs with blood. Allow thirty minutes for this exam. There are no restrictions or special instructions.  (In 6 months)  Follow-Up: You have been referred to Dr. Harold Barban at [facility] for consideration of Left Fem-Pop Bypass Graft.  Your physician wants you to follow-up in: 6 months with Dr. Gwenlyn Found. You will receive a reminder letter in the mail two months in advance. If you don't receive a letter, please call our office to schedule the follow-up appointment.  If you need a refill on your cardiac medications before your next appointment, please call your pharmacy.

## 2016-11-26 ENCOUNTER — Ambulatory Visit (HOSPITAL_COMMUNITY): Admission: RE | Admit: 2016-11-26 | Payer: Medicare HMO | Source: Ambulatory Visit

## 2016-12-01 ENCOUNTER — Ambulatory Visit (HOSPITAL_COMMUNITY)
Admission: RE | Admit: 2016-12-01 | Discharge: 2016-12-01 | Disposition: A | Discharge status: Home / Self Care | Payer: Medicare HMO | Source: Ambulatory Visit | Attending: Internal Medicine | Admitting: Internal Medicine

## 2016-12-01 DIAGNOSIS — I779 Disorder of arteries and arterioles, unspecified: Secondary | ICD-10-CM

## 2016-12-01 DIAGNOSIS — I739 Peripheral vascular disease, unspecified: Principal | ICD-10-CM

## 2016-12-23 ENCOUNTER — Telehealth: Payer: Self-pay | Admitting: Cardiovascular Disease

## 2016-12-23 NOTE — Telephone Encounter (Signed)
Mrs. Verhoeven is calling because she had surgery about 2 months ago and her leg is hurting , she had a knot develop on her leg this past Saturday and by Sunday the knot had moved (gone ) she is not sure if it is a blood clot or not. Please call

## 2016-12-23 NOTE — Telephone Encounter (Signed)
Returned call to patient of Dr. Gwenlyn Found who has had a PV angiogram + intervention. She is scheduled for new patient consult with Dr. Trula Slade tomorrow 10/17. She states she has had leg pain in her right leg since her procedure on 8/23. She states on Saturday night, she had a big knot on her leg and it was gone in a few hours. She is worried she has a blood clot. She has pain around her knee and up her thigh  Informed her would need to seek advice from MD on what her symptoms may be from and would follow up with her. She voiced understanding.

## 2016-12-24 ENCOUNTER — Ambulatory Visit (INDEPENDENT_AMBULATORY_CARE_PROVIDER_SITE_OTHER): Payer: Medicare HMO | Admitting: Surgery

## 2016-12-24 ENCOUNTER — Ambulatory Visit (INDEPENDENT_AMBULATORY_CARE_PROVIDER_SITE_OTHER)
Admission: RE | Admit: 2016-12-24 | Discharge: 2016-12-24 | Disposition: A | Discharge status: Home / Self Care | Payer: Medicare HMO | Source: Ambulatory Visit | Attending: Surgery | Admitting: Surgery

## 2016-12-24 ENCOUNTER — Other Ambulatory Visit: Payer: Self-pay | Admitting: Surgery

## 2016-12-24 ENCOUNTER — Encounter: Payer: Self-pay | Admitting: Surgery

## 2016-12-24 ENCOUNTER — Ambulatory Visit (HOSPITAL_COMMUNITY)
Admission: RE | Admit: 2016-12-24 | Discharge: 2016-12-24 | Disposition: A | Discharge status: Home / Self Care | Payer: Medicare HMO | Source: Ambulatory Visit | Attending: Surgery | Admitting: Surgery

## 2016-12-24 VITALS — BP 144/74 | HR 89 | Temp 98.5°F | Resp 16 | Ht 65.0 in | Wt 146.2 lb

## 2016-12-24 DIAGNOSIS — M79606 Pain in leg, unspecified: Secondary | ICD-10-CM | POA: Insufficient documentation

## 2016-12-24 DIAGNOSIS — I70212 Atherosclerosis of native arteries of extremities with intermittent claudication, left leg: Secondary | ICD-10-CM | POA: Diagnosis not present

## 2016-12-24 DIAGNOSIS — I739 Peripheral vascular disease, unspecified: Secondary | ICD-10-CM

## 2016-12-24 DIAGNOSIS — M7989 Other specified soft tissue disorders: Secondary | ICD-10-CM | POA: Insufficient documentation

## 2016-12-24 DIAGNOSIS — Z01818 Encounter for other preprocedural examination: Secondary | ICD-10-CM | POA: Insufficient documentation

## 2016-12-24 MED ORDER — CILOSTAZOL 100 MG PO TABS: 100.0000 mg | ORAL_TABLET | Freq: Two times a day (BID) | ORAL | 11 refills | Status: DC

## 2016-12-24 NOTE — Progress Notes (Signed)
[facility]  Patient name: Jessica Torres MRN: [id number] DOB: Dec 22, 1940 Sex: female   REQUESTING PROVIDER:    Dr. Gwenlyn Found   REASON FOR CONSULT:    Left leg claudication  HISTORY OF PRESENT ILLNESS:   Jessica Torres is a 76 y.o. female, who is By Dr. Gwenlyn Found for evaluation of left leg claudication.  She has previously undergone percutaneous intervention of the right leg as well as the left leg.  Unfortunately, she did not have a durable result in the left leg and this was unable to be recanalized.  She is here today to discuss possible surgical intervention.  The patient states that she can walk less than 100 feet before she gets pain and cramping in her left calf.  She sometimes has difficulty getting around her house.  This has become severely lifestyle limiting for her.  The patient also complains of numbness in her feet.  She has been diagnosed with neuropathy.  She suffers hypercholesterolemia and is on a statin however she is concerned that she may be having muscle weakness.  She is on dual antiplatelet therapy with aspirin and Plavix.  The patient does suffer from diabetes.  PAST MEDICAL HISTORY    Past Medical History:  Diagnosis Date  . Arthritis    "fingers probably" (10/30/2016)  . Bulging lumbar disc   . Chronic lower back pain   . Difficulty urinating   . GERD (gastroesophageal reflux disease)   . Heart murmur    hx  . Hyperlipidemia    statin intolerant  . Hypertension   . Hypothyroidism   . PAD (peripheral artery disease) ([place])   . Peripheral arterial disease ([place])   . Shortness of breath dyspnea   . Sleep apnea    "dx'd years ago; had severe problems; all of the sudden it just went away" (07/06/2014) *still gone" (10/30/2016)  . Stroke [facility]) ~ 2010   denies residual on 10/30/2016  . Type 2 diabetes mellitus ([place])   . Urgency of urination   . Uterine cancer ([place])      FAMILY HISTORY   Family  History  Problem Relation Age of Onset  . Heart attack Mother   . Heart disease Mother   . Arrhythmia Mother        has PPM  . Heart disease Father   . Diabetes Sister     SOCIAL HISTORY:   Social History   Social History  . Marital status: Divorced    Spouse name: N/A  . Number of children: N/A  . Years of education: N/A   Occupational History  . Not on file.   Social History Main Topics  . Smoking status: Never Smoker  . Smokeless tobacco: Never Used  . Alcohol use 0.0 oz/week     Comment: 10/30/2016  "margarita q  2 wks or so"  . Drug use: No  . Sexual activity: No   Other Topics Concern  . Not on file   Social History Narrative  . No narrative on file    ALLERGIES:    Allergies  Allergen Reactions  . Morphine And Related Swelling and Other (See Comments)    Facial swelling  . Latex Rash  . Statins Other (See Comments)    Myalgias     CURRENT MEDICATIONS:    Current Outpatient Prescriptions  Medication Sig Dispense Refill  . aspirin EC 81 MG tablet Take 81 mg by mouth daily.    Marland Kitchen atorvastatin (LIPITOR)  80 MG tablet Take 80 mg by mouth daily.    . clopidogrel (PLAVIX) 75 MG tablet Take 75 mg by mouth daily.     . ergocalciferol (VITAMIN D2) 50000 units capsule Take 50,000 Units by mouth once a week. No specific day    . fenofibrate 160 MG tablet Take 160 mg by mouth daily.    Marland Kitchen glimepiride (AMARYL) 4 MG tablet Take 4 mg by mouth daily with breakfast.     . Insulin Glargine (TOUJEO MAX SOLOSTAR) 300 UNIT/ML SOPN Inject 42 Units into the skin every morning.     . insulin lispro (HUMALOG) 100 UNIT/ML injection Inject 12 Units into the skin 3 (three) times daily before meals.     Marland Kitchen levothyroxine (SYNTHROID, LEVOTHROID) 88 MCG tablet Take 88 mcg by mouth daily before breakfast.     . losartan-hydrochlorothiazide (HYZAAR) 50-12.5 MG per tablet Take 1 tablet by mouth daily.    . metFORMIN (GLUCOPHAGE) 1000 MG tablet Take 1 tablet (1,000 mg total) by mouth 2  (two) times daily with a meal.    . pantoprazole (PROTONIX) 40 MG tablet Take 40 mg by mouth daily.      No current facility-administered medications for this visit.     REVIEW OF SYSTEMS:   [X]  denotes positive finding, [ ]  denotes negative finding Cardiac  Comments:  Chest pain or chest pressure:    Shortness of breath upon exertion:    Short of breath when lying flat:    Irregular heart rhythm:        Vascular    Pain in calf, thigh, or hip brought on by ambulation: x   Pain in feet at night that wakes you up from your sleep:     Blood clot in your veins:    Leg swelling:         Pulmonary    Oxygen at home:    Productive cough:     Wheezing:         Neurologic    Sudden weakness in arms or legs:  x   Sudden numbness in arms or legs:  x   Sudden onset of difficulty speaking or slurred speech:    Temporary loss of vision in one eye:     Problems with dizziness:         Gastrointestinal    Blood in stool:      Vomited blood:         Genitourinary    Burning when urinating:     Blood in urine:        Psychiatric    Major depression:         Hematologic    Bleeding problems:    Problems with blood clotting too easily:        Skin    Rashes or ulcers:        Constitutional    Fever or chills:     PHYSICAL EXAM:   Vitals:   12/24/16 0912  BP: (!) 144/74  Pulse: 89  Resp: 16  Temp: 98.5 F (36.9 C)  TempSrc: Oral  SpO2: 100%  Weight: 146 lb 3.2 oz (66.3 kg)  Height: 5\' 5"  (1.651 m)    GENERAL: The patient is a well-nourished female, in no acute distress. The vital signs are documented above. CARDIAC: There is a regular rate and rhythm.  VASCULAR: Nonpalpable pedal pulses PULMONARY: Nonlabored respirations ABDOMEN: Soft and non-tender with normal pitched bowel sounds.  MUSCULOSKELETAL: There are no major deformities  or cyanosis. NEUROLOGIC: No focal weakness or paresthesias are detected. SKIN: There are no ulcers or rashes noted. PSYCHIATRIC:  The patient has a normal affect.  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  The dominant runoff is the posterior tibial artery.  Carotid duplex 1 -39% bilateral  Vein map:I reviewed her vein mapping images.  She has a marginal left saphenous vein.  We also ruled her out for a DVT on the right   ASSESSMENT and PLAN   Lower extremity vascular disease: IDiscussed the options of surgical intervention versus nonoperative therapy.  She would like to try to treat this nonoperatively.  I am going to start her on cilostazol to see if she gets any benefit from this.  I have her scheduled to follow-up with me in 3 months.  If she does decide with surgery, I would try to use her vein and go to the above-knee popliteal artery.  Statin management: The patient is concerned that she has having muscle weakness from her statin.  I have encouraged her to speak with her primary care physician to discuss changing to a different statin.   Annamarie Major, MD [facility] [phone] Pager [phone]

## 2016-12-26 NOTE — Telephone Encounter (Signed)
Patient has seen Dr. Trula Slade since this call came in. She was started on pletal.   Routed to MD/CMA for any further recommendations.

## 2017-01-01 ENCOUNTER — Other Ambulatory Visit: Payer: Self-pay | Admitting: Surgery

## 2017-01-01 MED ORDER — CILOSTAZOL 100 MG PO TABS: 100.0000 mg | ORAL_TABLET | Freq: Two times a day (BID) | ORAL | 11 refills | Status: DC

## 2017-02-12 DIAGNOSIS — M5442 Lumbago with sciatica, left side: Secondary | ICD-10-CM | POA: Insufficient documentation

## 2017-02-26 ENCOUNTER — Other Ambulatory Visit: Payer: Self-pay

## 2017-02-26 DIAGNOSIS — I739 Peripheral vascular disease, unspecified: Secondary | ICD-10-CM

## 2017-02-26 DIAGNOSIS — E1165 Type 2 diabetes mellitus with hyperglycemia: Secondary | ICD-10-CM

## 2017-02-26 DIAGNOSIS — E1151 Type 2 diabetes mellitus with diabetic peripheral angiopathy without gangrene: Secondary | ICD-10-CM

## 2017-02-26 DIAGNOSIS — IMO0002 Reserved for concepts with insufficient information to code with codable children: Secondary | ICD-10-CM

## 2017-04-06 ENCOUNTER — Ambulatory Visit: Payer: Medicare HMO | Admitting: Surgery

## 2017-04-06 ENCOUNTER — Encounter (HOSPITAL_COMMUNITY): Payer: Medicare HMO

## 2017-04-06 ENCOUNTER — Inpatient Hospital Stay (HOSPITAL_COMMUNITY): Admission: RE | Admit: 2017-04-06 | Payer: Medicare HMO | Source: Ambulatory Visit

## 2017-06-04 DIAGNOSIS — M25552 Pain in left hip: Secondary | ICD-10-CM | POA: Insufficient documentation

## 2017-07-24 ENCOUNTER — Encounter: Payer: Self-pay | Admitting: *Deleted

## 2017-07-24 DIAGNOSIS — Z006 Encounter for examination for normal comparison and control in clinical research program: Secondary | ICD-10-CM

## 2017-07-24 NOTE — Progress Notes (Signed)
Jessica Torres called today after receiving a follow-up letter from the research Office. She states her right leg is doing well however her left leg has been an issue. She is going to follow-up with Dr. Gwenlyn Found regarding her left leg.

## 2017-09-16 ENCOUNTER — Other Ambulatory Visit: Payer: Self-pay | Admitting: Adult Health Nurse Practitioner

## 2017-09-16 DIAGNOSIS — Z1231 Encounter for screening mammogram for malignant neoplasm of breast: Secondary | ICD-10-CM

## 2017-10-29 ENCOUNTER — Ambulatory Visit
Admission: RE | Admit: 2017-10-29 | Discharge: 2017-10-29 | Disposition: A | Discharge status: Home / Self Care | Payer: Medicare HMO | Source: Ambulatory Visit | Attending: Adult Health Nurse Practitioner | Admitting: Adult Health Nurse Practitioner

## 2017-10-29 DIAGNOSIS — Z1231 Encounter for screening mammogram for malignant neoplasm of breast: Secondary | ICD-10-CM

## 2017-11-24 ENCOUNTER — Ambulatory Visit: Payer: Medicare HMO | Admitting: Cardiovascular Disease

## 2017-12-10 DIAGNOSIS — I6521 Occlusion and stenosis of right carotid artery: Secondary | ICD-10-CM | POA: Insufficient documentation

## 2017-12-15 ENCOUNTER — Ambulatory Visit: Payer: Medicare HMO | Admitting: Cardiovascular Disease

## 2017-12-15 DIAGNOSIS — R0989 Other specified symptoms and signs involving the circulatory and respiratory systems: Secondary | ICD-10-CM

## 2017-12-16 ENCOUNTER — Encounter: Payer: Self-pay | Admitting: *Deleted

## 2018-01-27 ENCOUNTER — Ambulatory Visit: Payer: Medicare HMO | Admitting: Cardiovascular Disease

## 2018-09-21 ENCOUNTER — Other Ambulatory Visit: Payer: Self-pay | Admitting: Physician Assistant

## 2018-09-21 DIAGNOSIS — Z1231 Encounter for screening mammogram for malignant neoplasm of breast: Secondary | ICD-10-CM

## 2018-10-19 ENCOUNTER — Ambulatory Visit (INDEPENDENT_AMBULATORY_CARE_PROVIDER_SITE_OTHER): Payer: Medicare HMO | Admitting: Physician Assistant

## 2018-10-19 ENCOUNTER — Encounter: Payer: Self-pay | Admitting: Physician Assistant

## 2018-10-19 DIAGNOSIS — E1159 Type 2 diabetes mellitus with other circulatory complications: Secondary | ICD-10-CM

## 2018-10-19 DIAGNOSIS — I739 Peripheral vascular disease, unspecified: Secondary | ICD-10-CM

## 2018-10-19 DIAGNOSIS — I152 Hypertension secondary to endocrine disorders: Secondary | ICD-10-CM

## 2018-10-19 DIAGNOSIS — I10 Essential (primary) hypertension: Secondary | ICD-10-CM

## 2018-10-19 DIAGNOSIS — I2510 Atherosclerotic heart disease of native coronary artery without angina pectoris: Secondary | ICD-10-CM | POA: Diagnosis not present

## 2018-10-19 DIAGNOSIS — E039 Hypothyroidism, unspecified: Secondary | ICD-10-CM

## 2018-10-19 DIAGNOSIS — K219 Gastro-esophageal reflux disease without esophagitis: Secondary | ICD-10-CM

## 2018-10-19 DIAGNOSIS — I2583 Coronary atherosclerosis due to lipid rich plaque: Secondary | ICD-10-CM

## 2018-10-19 DIAGNOSIS — E1059 Type 1 diabetes mellitus with other circulatory complications: Secondary | ICD-10-CM

## 2018-10-19 MED ORDER — INSULIN LISPRO 100 UNIT/ML CARTRIDGE: 12.0000 [IU] | Freq: Three times a day (TID) | SUBCUTANEOUS | 3 refills | Status: DC

## 2018-10-19 MED ORDER — ACCU-CHEK AVIVA PLUS VI STRP: 1.0000 | ORAL_STRIP | Freq: Four times a day (QID) | 1 refills | Status: DC

## 2018-10-19 MED ORDER — METFORMIN HCL 1000 MG PO TABS: 1000.0000 mg | ORAL_TABLET | Freq: Two times a day (BID) | ORAL | Status: DC

## 2018-10-19 NOTE — Progress Notes (Signed)
Telephone visit  Subjective: CC: Establish as a new patient PCP: Terald Sleeper, PA-C PFX:[id number] Jessica Torres is a 78 y.o. female calls for telephone consult today. Patient provides verbal consent for consult held via phone.  Patient is identified with 2 separate identifiers.  At this time the entire area is on COVID-19 social distancing and stay home orders are in place.  Patient is of higher risk and therefore we are performing this by a virtual method.  Location of patient: Home Location of provider: HOME Others present for call: No  This patient is having a visit to establish as a new patient.  She has been a long patient of Dr. Murrell Redden.  She does have a very long medical history including history of stroke, peripheral arterial disease, hypertension, type 1 diabetes, GERD, hypothyroidism, hyperlipidemia, degenerative disc disease, history of sciatica, history of uterine cancer, vitamin Jessica deficiency.  The most concerning thing today is her diabetes.  She states that she had a low episode during the night a week or 2 ago.  And they got to 44.  They did call the emergency services and someone came and they helped her.  She did not go to the hospital.  She has been using Toujeo 38 units 1 daily along with Humalog 12 units at each meal.  I have discussed that I would like her to reduce her Toujeo to only 30 units daily until I can see her back and Humalog to only use 6 units at her supper meal.  She also was on glimepiride and I have discontinued that medication.  She is to continue her metformin.  She states she does need new strips for her machine and she does have an Accu-Chek Aviva.  The order will be [facility] to [facility].  The patient does need labs.  We will have an order placed and she will come at her earliest convenience to do this.  She does need multitude of labs.  She does have hypothyroidism and she notes that her hair is still being lost.  The patient does have peripheral vascular disease  and is seen by vascular surgeons in [place] but she has not been able to go for some time because of COVID-19 restrictions   ROS: Per HPI  Allergies  Allergen Reactions  . Morphine And Related Swelling and Other (See Comments)    Facial swelling  . Latex Rash  . Statins Other (See Comments)    Myalgias    Past Medical History:  Diagnosis Date  . Arthritis    "fingers probably" (10/30/2016)  . Bulging lumbar disc   . Chronic lower back pain   . Difficulty urinating   . GERD (gastroesophageal reflux disease)   . Heart murmur    hx  . Hyperlipidemia    statin intolerant  . Hypertension   . Hypothyroidism   . PAD (peripheral artery disease) ([place])   . Peripheral arterial disease ([place])   . Shortness of breath dyspnea   . Sleep apnea    "dx'Jessica years ago; had severe problems; all of the sudden it just went away" (07/06/2014) *still gone" (10/30/2016)  . Stroke [facility]) ~ 2010   denies residual on 10/30/2016  . Type 2 diabetes mellitus ([place])   . Urgency of urination   . Uterine cancer (HCC)     Current Outpatient Medications:  .  aspirin EC 81 MG tablet, Take 81 mg by mouth daily., Disp: , Rfl:  .  atorvastatin (LIPITOR) 80 MG  tablet, Take 80 mg by mouth daily., Disp: , Rfl:  .  clopidogrel (PLAVIX) 75 MG tablet, Take 75 mg by mouth daily. , Disp: , Rfl:  .  ergocalciferol (VITAMIN D2) 50000 units capsule, Take 50,000 Units by mouth once a week. No specific day, Disp: , Rfl:  .  fenofibrate 160 MG tablet, Take 160 mg by mouth daily., Disp: , Rfl:  .  glucose blood (ACCU-CHEK AVIVA PLUS) test strip, 1 each by Other route 4 (four) times daily., Disp: 360 each, Rfl: 1 .  Insulin Glargine, 2 Unit Dial, (TOUJEO MAX SOLOSTAR) 300 UNIT/ML SOPN, Inject 38 Units into the skin daily., Disp: , Rfl:  .  insulin lispro (HUMALOG) 100 UNIT/ML cartridge, Inject 0.12 mLs (12 Units total) into the skin 3 (three) times daily before meals., Disp: 45 mL, Rfl: 3 .  levothyroxine (SYNTHROID, LEVOTHROID)  88 MCG tablet, Take 88 mcg by mouth daily before breakfast. , Disp: , Rfl:  .  losartan-hydrochlorothiazide (HYZAAR) 50-12.5 MG per tablet, Take 1 tablet by mouth daily., Disp: , Rfl:  .  metFORMIN (GLUCOPHAGE) 1000 MG tablet, Take 1 tablet (1,000 mg total) by mouth 2 (two) times daily with a meal., Disp:  , Rfl:  .  pantoprazole (PROTONIX) 40 MG tablet, Take 40 mg by mouth daily. , Disp: , Rfl:  .  cilostazol (PLETAL) 100 MG tablet, Take 1 tablet (100 mg total) by mouth 2 (two) times daily before a meal. (Patient not taking: Reported on 10/19/2018), Disp: 60 tablet, Rfl: 11  Assessment/ Plan: 78 y.o. female   1. Peripheral arterial disease - Bilateral SFA  Continue with vascular surgeon Continue lipid control  2. Hypertension associated with diabetes ([place]) Continue medications for control  3. Coronary artery disease due to lipid rich plaque Continue medications  4. Hypothyroidism, unspecified type Continue medications - TSH; Future  5. Gastroesophageal reflux disease without esophagitis Continue medications  6. Type 1 diabetes mellitus with other circulatory complication (HCC) - Insulin Glargine, 2 Unit Dial, (TOUJEO MAX SOLOSTAR) 300 UNIT/ML SOPN; Inject 38 Units into the skin daily. - insulin lispro (HUMALOG) 100 UNIT/ML cartridge; Inject 0.12 mLs (12 Units total) into the skin 3 (three) times daily before meals.  Dispense: 45 mL; Refill: 3 - metFORMIN (GLUCOPHAGE) 1000 MG tablet; Take 1 tablet (1,000 mg total) by mouth 2 (two) times daily with a meal.  Dispense:   - glucose blood (ACCU-CHEK AVIVA PLUS) test strip; 1 each by Other route 4 (four) times daily.  Dispense: 360 each; Refill: 1 - CBC with Differential/Platelet; Future - CMP14+EGFR; Future - Lipid panel; Future - Bayer DCA Hb A1c Waived; Future   No follow-ups on file.  Continue all other maintenance medications as listed above.  Start time: 9:45 AM End time: 10:15 AM  Meds ordered this encounter  Medications   . DISCONTD: glucose blood (ACCU-CHEK AVIVA PLUS) test strip    Sig: 1 each by Other route 4 (four) times daily.    Dispense:  360 each    Refill:  1    Order Specific Question:   Supervising Provider    Answer:   Janora Norlander [[id number]]  . insulin lispro (HUMALOG) 100 UNIT/ML cartridge    Sig: Inject 0.12 mLs (12 Units total) into the skin 3 (three) times daily before meals.    Dispense:  45 mL    Refill:  3    Order Specific Question:   Supervising Provider    Answer:   Janora Norlander [[id number]]  .  metFORMIN (GLUCOPHAGE) 1000 MG tablet    Sig: Take 1 tablet (1,000 mg total) by mouth 2 (two) times daily with a meal.    Dispense:       Order Specific Question:   Supervising Provider    Answer:   Janora Norlander [[id number]]  . glucose blood (ACCU-CHEK AVIVA PLUS) test strip    Sig: 1 each by Other route 4 (four) times daily.    Dispense:  360 each    Refill:  1    Order Specific Question:   Supervising Provider    Answer:   Janora Norlander [[id number]]    Particia Nearing PA-C [place] [phone]

## 2018-10-22 NOTE — Progress Notes (Signed)
appt scheduled

## 2018-11-03 ENCOUNTER — Telehealth: Payer: Self-pay | Admitting: Physician Assistant

## 2018-11-03 ENCOUNTER — Other Ambulatory Visit: Payer: Self-pay

## 2018-11-03 NOTE — Telephone Encounter (Signed)
lmtcb - generic was sent not brand name.

## 2018-11-04 ENCOUNTER — Ambulatory Visit
Admission: RE | Admit: 2018-11-04 | Discharge: 2018-11-04 | Disposition: A | Discharge status: Home / Self Care | Payer: Medicare HMO | Source: Ambulatory Visit | Attending: Physician Assistant | Admitting: Physician Assistant

## 2018-11-04 DIAGNOSIS — Z1231 Encounter for screening mammogram for malignant neoplasm of breast: Secondary | ICD-10-CM

## 2018-11-05 ENCOUNTER — Other Ambulatory Visit: Payer: Medicare HMO

## 2018-11-05 ENCOUNTER — Telehealth: Payer: Self-pay | Admitting: Physician Assistant

## 2018-11-05 ENCOUNTER — Other Ambulatory Visit: Payer: Self-pay

## 2018-11-05 DIAGNOSIS — E1059 Type 1 diabetes mellitus with other circulatory complications: Secondary | ICD-10-CM

## 2018-11-05 DIAGNOSIS — E039 Hypothyroidism, unspecified: Secondary | ICD-10-CM

## 2018-11-05 LAB — BAYER DCA HB A1C WAIVED: HB A1C (BAYER DCA - WAIVED): 9.1 % — ABNORMAL HIGH (ref <7.0)

## 2018-11-05 NOTE — Telephone Encounter (Signed)
Refer to phone call from 11/05/18.

## 2018-11-05 NOTE — Telephone Encounter (Signed)
Patient is requesting a refill of Toejeo 30 units daily quickpen- not on medication list.  Patient states that she takes Lispro 6 units daily quickpen. Pharmacy- [facility].  Please advise

## 2018-11-06 LAB — CMP14+EGFR
ALT: 13 IU/L (ref 0–32)
AST: 20 IU/L (ref 0–40)
Albumin/Globulin Ratio: 1.7 (ref 1.2–2.2)
Albumin: 4.3 g/dL (ref 3.7–4.7)
Alkaline Phosphatase: 51 IU/L (ref 39–117)
BUN/Creatinine Ratio: 10 — ABNORMAL LOW (ref 12–28)
BUN: 16 mg/dL (ref 8–27)
Bilirubin Total: 0.4 mg/dL (ref 0.0–1.2)
CO2: 21 mmol/L (ref 20–29)
Calcium: 10.2 mg/dL (ref 8.7–10.3)
Chloride: 101 mmol/L (ref 96–106)
Creatinine, Ser: 1.63 mg/dL — ABNORMAL HIGH (ref 0.57–1.00)
GFR calc Af Amer: 35 mL/min/{1.73_m2} — ABNORMAL LOW (ref >59)
GFR calc non Af Amer: 31 mL/min/{1.73_m2} — ABNORMAL LOW (ref >59)
Globulin, Total: 2.5 g/dL (ref 1.5–4.5)
Glucose: 213 mg/dL — ABNORMAL HIGH (ref 65–99)
Potassium: 4.7 mmol/L (ref 3.5–5.2)
Sodium: 136 mmol/L (ref 134–144)
Total Protein: 6.8 g/dL (ref 6.0–8.5)

## 2018-11-06 LAB — LIPID PANEL
Chol/HDL Ratio: 4 ratio (ref 0.0–4.4)
Cholesterol, Total: 205 mg/dL — ABNORMAL HIGH (ref 100–199)
HDL: 51 mg/dL (ref >39)
LDL Calculated: 117 mg/dL — ABNORMAL HIGH (ref 0–99)
Triglycerides: 184 mg/dL — ABNORMAL HIGH (ref 0–149)
VLDL Cholesterol Cal: 37 mg/dL (ref 5–40)

## 2018-11-06 LAB — CBC WITH DIFFERENTIAL/PLATELET
Basophils Absolute: 0.1 10*3/uL (ref 0.0–0.2)
Basos: 1 %
EOS (ABSOLUTE): 0.4 10*3/uL (ref 0.0–0.4)
Eos: 5 %
Hematocrit: 40.1 % (ref 34.0–46.6)
Hemoglobin: 13.1 g/dL (ref 11.1–15.9)
Immature Grans (Abs): 0 10*3/uL (ref 0.0–0.1)
Immature Granulocytes: 0 %
Lymphocytes Absolute: 3 10*3/uL (ref 0.7–3.1)
Lymphs: 38 %
MCH: 28.5 pg (ref 26.6–33.0)
MCHC: 32.7 g/dL (ref 31.5–35.7)
MCV: 87 fL (ref 79–97)
Monocytes Absolute: 0.8 10*3/uL (ref 0.1–0.9)
Monocytes: 11 %
Neutrophils Absolute: 3.5 10*3/uL (ref 1.4–7.0)
Neutrophils: 45 %
Platelets: 391 10*3/uL (ref 150–450)
RBC: 4.6 x10E6/uL (ref 3.77–5.28)
RDW: 12.8 % (ref 11.7–15.4)
WBC: 7.8 10*3/uL (ref 3.4–10.8)

## 2018-11-06 LAB — TSH: TSH: 1.98 u[IU]/mL (ref 0.450–4.500)

## 2018-11-23 ENCOUNTER — Ambulatory Visit (INDEPENDENT_AMBULATORY_CARE_PROVIDER_SITE_OTHER): Payer: Medicare HMO | Admitting: Physician Assistant

## 2018-11-23 ENCOUNTER — Other Ambulatory Visit: Payer: Self-pay

## 2018-11-23 ENCOUNTER — Encounter: Payer: Self-pay | Admitting: Physician Assistant

## 2018-11-23 VITALS — BP 130/70 | HR 88 | Temp 98.2°F | Ht 65.0 in | Wt 143.6 lb

## 2018-11-23 DIAGNOSIS — L659 Nonscarring hair loss, unspecified: Secondary | ICD-10-CM

## 2018-11-23 DIAGNOSIS — E7800 Pure hypercholesterolemia, unspecified: Secondary | ICD-10-CM

## 2018-11-23 MED ORDER — EZETIMIBE 10 MG PO TABS: 10.0000 mg | ORAL_TABLET | Freq: Every day | ORAL | 3 refills | Status: DC

## 2018-11-23 MED ORDER — FENOFIBRATE 160 MG PO TABS: 160.0000 mg | ORAL_TABLET | Freq: Every day | ORAL | 3 refills | Status: DC

## 2018-11-25 NOTE — Progress Notes (Signed)
BP 130/70   Pulse 88   Temp 98.2 F (36.8 C) (Temporal)   Ht 5' 5" (1.651 m)   Wt 143 lb 9.6 oz (65.1 kg)   SpO2 98%   BMI 23.90 kg/m    Subjective:    Patient ID: Jessica Torres, female    DOB: 02-17-1941, 78 y.o.   MRN: [id number]  HPI: Jessica Torres is a 78 y.o. female presenting on 11/23/2018 for Medical Management of Chronic Issues (4 week follow up )  This patient comes in for follow-up on her chronic medical conditions.  We did a phone visit for her first visit and got everything up-to-date.  As of today is her first visit in the office.  She is under the care of vascular surgeon and they state that she does need peripheral vascular surgery in her legs in the future.  And she will be working on that.  She does not want dermatology evaluation locally.  We will get that set up also.  She has been having weight loss for no known reason.  Her thyroid was good and in control.  She has had good testing and good sugar readings.  She also has had good blood pressure readings.  She has gotten up to 35 units of Toujeo.  Past Medical History:  Diagnosis Date  . Arthritis    "fingers probably" (10/30/2016)  . Bulging lumbar disc   . Chronic lower back pain   . Difficulty urinating   . GERD (gastroesophageal reflux disease)   . Heart murmur    hx  . Hyperlipidemia    statin intolerant  . Hypertension   . Hypothyroidism   . PAD (peripheral artery disease) ([place])   . Peripheral arterial disease ([place])   . Shortness of breath dyspnea   . Sleep apnea    "dx'd years ago; had severe problems; all of the sudden it just went away" (07/06/2014) *still gone" (10/30/2016)  . Stroke [facility]) ~ 2010   denies residual on 10/30/2016  . Type 2 diabetes mellitus ([place])   . Urgency of urination   . Uterine cancer ([place])    Relevant past medical, surgical, family and social history reviewed and updated as indicated. Interim medical history since our last visit reviewed. Allergies and medications  reviewed and updated. DATA REVIEWED: CHART IN EPIC  Family History reviewed for pertinent findings.  Review of Systems  Constitutional: Negative.   HENT: Negative.   Eyes: Negative.   Respiratory: Negative.   Gastrointestinal: Negative.   Genitourinary: Negative.     Allergies as of 11/23/2018      Reactions   Morphine And Related Swelling, Other (See Comments)   Facial swelling   Latex Rash   Statins Other (See Comments)   Myalgias      Medication List       Accurate as of November 23, 2018 11:59 PM. If you have any questions, ask your nurse or doctor.        STOP taking these medications   atorvastatin 80 MG tablet Commonly known as: LIPITOR Stopped by: Terald Sleeper, PA-C     TAKE these medications   Accu-Chek Aviva Plus test strip Generic drug: glucose blood 1 each by Other route 4 (four) times daily.   aspirin EC 81 MG tablet Take 81 mg by mouth daily.   cilostazol 100 MG tablet Commonly known as: PLETAL Take 1 tablet (100 mg total) by mouth 2 (two) times daily before a meal.  clopidogrel 75 MG tablet Commonly known as: PLAVIX Take 75 mg by mouth daily.   ergocalciferol 1.25 MG (50000 UT) capsule Commonly known as: VITAMIN D2 Take 50,000 Units by mouth once a week. No specific day   ezetimibe 10 MG tablet Commonly known as: ZETIA Take 1 tablet (10 mg total) by mouth daily.   fenofibrate 160 MG tablet Take 1 tablet (160 mg total) by mouth daily.   insulin lispro 100 UNIT/ML cartridge Commonly known as: HUMALOG Inject 0.12 mLs (12 Units total) into the skin 3 (three) times daily before meals.   levothyroxine 88 MCG tablet Commonly known as: SYNTHROID Take 88 mcg by mouth daily before breakfast.   losartan-hydrochlorothiazide 50-12.5 MG tablet Commonly known as: HYZAAR Take 1 tablet by mouth daily.   metFORMIN 1000 MG tablet Commonly known as: GLUCOPHAGE Take 1 tablet (1,000 mg total) by mouth 2 (two) times daily with a meal.    pantoprazole 40 MG tablet Commonly known as: PROTONIX Take 40 mg by mouth daily.   Toujeo Max SoloStar 300 UNIT/ML Sopn Generic drug: Insulin Glargine (2 Unit Dial) Inject 38 Units into the skin daily.          Objective:    BP 130/70   Pulse 88   Temp 98.2 F (36.8 C) (Temporal)   Ht 5' 5" (1.651 m)   Wt 143 lb 9.6 oz (65.1 kg)   SpO2 98%   BMI 23.90 kg/m   Allergies  Allergen Reactions  . Morphine And Related Swelling and Other (See Comments)    Facial swelling  . Latex Rash  . Statins Other (See Comments)    Myalgias     Wt Readings from Last 3 Encounters:  11/23/18 143 lb 9.6 oz (65.1 kg)  12/24/16 146 lb 3.2 oz (66.3 kg)  11/25/16 145 lb 8 oz (66 kg)    Physical Exam Constitutional:      General: She is not in acute distress.    Appearance: Normal appearance. She is well-developed.  HENT:     Head: Normocephalic and atraumatic.  Cardiovascular:     Rate and Rhythm: Normal rate.  Pulmonary:     Effort: Pulmonary effort is normal.  Skin:    General: Skin is warm and dry.     Findings: No rash.  Neurological:     Mental Status: She is alert and oriented to person, place, and time.     Deep Tendon Reflexes: Reflexes are normal and symmetric.    Diabetic Foot Exam - Simple   Simple Foot Form Diabetic Foot exam was performed with the following findings: Yes 11/23/2018  4:17 PM  Visual Inspection No deformities, no ulcerations, no other skin breakdown bilaterally: Yes Sensation Testing Intact to touch and monofilament testing bilaterally: Yes Pulse Check Posterior Tibialis and Dorsalis pulse intact bilaterally: Yes Comments     Results for orders placed or performed in visit on 11/05/18  Bayer DCA Hb A1c Waived  Result Value Ref Range   HB A1C (BAYER DCA - WAIVED) 9.1 (H) <7.0 %  TSH  Result Value Ref Range   TSH 1.980 0.450 - 4.500 uIU/mL  Lipid panel  Result Value Ref Range   Cholesterol, Total 205 (H) 100 - 199 mg/dL   Triglycerides 184  (H) 0 - 149 mg/dL   HDL 51 >39 mg/dL   VLDL Cholesterol Cal 37 5 - 40 mg/dL   LDL Calculated 117 (H) 0 - 99 mg/dL   Chol/HDL Ratio 4.0 0.0 - 4.4 ratio  CMP14+EGFR  Result Value Ref Range   Glucose 213 (H) 65 - 99 mg/dL   BUN 16 8 - 27 mg/dL   Creatinine, Ser 1.63 (H) 0.57 - 1.00 mg/dL   GFR calc non Af Amer 31 (L) >59 mL/min/1.73   GFR calc Af Amer 35 (L) >59 mL/min/1.73   BUN/Creatinine Ratio 10 (L) 12 - 28   Sodium 136 134 - 144 mmol/L   Potassium 4.7 3.5 - 5.2 mmol/L   Chloride 101 96 - 106 mmol/L   CO2 21 20 - 29 mmol/L   Calcium 10.2 8.7 - 10.3 mg/dL   Total Protein 6.8 6.0 - 8.5 g/dL   Albumin 4.3 3.7 - 4.7 g/dL   Globulin, Total 2.5 1.5 - 4.5 g/dL   Albumin/Globulin Ratio 1.7 1.2 - 2.2   Bilirubin Total 0.4 0.0 - 1.2 mg/dL   Alkaline Phosphatase 51 39 - 117 IU/L   AST 20 0 - 40 IU/L   ALT 13 0 - 32 IU/L  CBC with Differential/Platelet  Result Value Ref Range   WBC 7.8 3.4 - 10.8 x10E3/uL   RBC 4.60 3.77 - 5.28 x10E6/uL   Hemoglobin 13.1 11.1 - 15.9 g/dL   Hematocrit 40.1 34.0 - 46.6 %   MCV 87 79 - 97 fL   MCH 28.5 26.6 - 33.0 pg   MCHC 32.7 31.5 - 35.7 g/dL   RDW 12.8 11.7 - 15.4 %   Platelets 391 150 - 450 x10E3/uL   Neutrophils 45 Not Estab. %   Lymphs 38 Not Estab. %   Monocytes 11 Not Estab. %   Eos 5 Not Estab. %   Basos 1 Not Estab. %   Neutrophils Absolute 3.5 1.4 - 7.0 x10E3/uL   Lymphocytes Absolute 3.0 0.7 - 3.1 x10E3/uL   Monocytes Absolute 0.8 0.1 - 0.9 x10E3/uL   EOS (ABSOLUTE) 0.4 0.0 - 0.4 x10E3/uL   Basophils Absolute 0.1 0.0 - 0.2 x10E3/uL   Immature Granulocytes 0 Not Estab. %   Immature Grans (Abs) 0.0 0.0 - 0.1 x10E3/uL      Assessment & Plan:   1. Hair loss - Ambulatory referral to Dermatology  2. Pure hypercholesterolemia - ezetimibe (ZETIA) 10 MG tablet; Take 1 tablet (10 mg total) by mouth daily.  Dispense: 90 tablet; Refill: 3 - fenofibrate 160 MG tablet; Take 1 tablet (160 mg total) by mouth daily.  Dispense: 90 tablet;  Refill: 3   Continue all other maintenance medications as listed above.  Follow up plan: Return in about 6 months (around 05/23/2019) for labs end of November.  Educational handout given for [place] PA-C [place] [address]  [place], [place] [address] [phone]   11/25/2018, 7:14 PM

## 2019-01-11 ENCOUNTER — Other Ambulatory Visit: Payer: Self-pay | Admitting: Physician Assistant

## 2019-01-12 NOTE — Telephone Encounter (Signed)
NA/NVM. Need to find out what medication she is referring to. Fenofibrate & Zetia sent for a year on 11/23/18

## 2019-01-31 ENCOUNTER — Other Ambulatory Visit: Payer: Medicare HMO

## 2019-01-31 ENCOUNTER — Other Ambulatory Visit: Payer: Self-pay

## 2019-01-31 ENCOUNTER — Other Ambulatory Visit: Payer: Self-pay | Admitting: Physician Assistant

## 2019-01-31 DIAGNOSIS — E1059 Type 1 diabetes mellitus with other circulatory complications: Secondary | ICD-10-CM

## 2019-02-01 LAB — SPECIMEN STATUS

## 2019-02-01 LAB — CMP14+EGFR
ALT: 11 IU/L (ref 0–32)
AST: 15 IU/L (ref 0–40)
Albumin/Globulin Ratio: 1.5 (ref 1.2–2.2)
Albumin: 4 g/dL (ref 3.7–4.7)
Alkaline Phosphatase: 55 IU/L (ref 39–117)
BUN/Creatinine Ratio: 14 (ref 12–28)
BUN: 23 mg/dL (ref 8–27)
Bilirubin Total: 0.4 mg/dL (ref 0.0–1.2)
CO2: 21 mmol/L (ref 20–29)
Calcium: 10.2 mg/dL (ref 8.7–10.3)
Chloride: 97 mmol/L (ref 96–106)
Creatinine, Ser: 1.65 mg/dL — ABNORMAL HIGH (ref 0.57–1.00)
GFR calc Af Amer: 35 mL/min/{1.73_m2} — ABNORMAL LOW (ref >59)
GFR calc non Af Amer: 30 mL/min/{1.73_m2} — ABNORMAL LOW (ref >59)
Globulin, Total: 2.7 g/dL (ref 1.5–4.5)
Glucose: 260 mg/dL — ABNORMAL HIGH (ref 65–99)
Potassium: 4.7 mmol/L (ref 3.5–5.2)
Sodium: 136 mmol/L (ref 134–144)
Total Protein: 6.7 g/dL (ref 6.0–8.5)

## 2019-02-01 LAB — LIPID PANEL
Chol/HDL Ratio: 4.4 ratio (ref 0.0–4.4)
Cholesterol, Total: 195 mg/dL (ref 100–199)
HDL: 44 mg/dL (ref >39)
LDL Chol Calc (NIH): 111 mg/dL — ABNORMAL HIGH (ref 0–99)
Triglycerides: 230 mg/dL — ABNORMAL HIGH (ref 0–149)
VLDL Cholesterol Cal: 40 mg/dL (ref 5–40)

## 2019-02-01 LAB — SPECIMEN STATUS REPORT

## 2019-02-02 ENCOUNTER — Telehealth: Payer: Self-pay | Admitting: Physician Assistant

## 2019-02-02 ENCOUNTER — Other Ambulatory Visit: Payer: Self-pay | Admitting: *Deleted

## 2019-02-02 NOTE — Telephone Encounter (Signed)
Spoke with patient. See lab results 

## 2019-02-04 LAB — HGB A1C W/O EAG: Hgb A1c MFr Bld: 9.8 % — ABNORMAL HIGH (ref 4.8–5.6)

## 2019-02-04 LAB — SPECIMEN STATUS REPORT

## 2019-02-26 IMAGING — MG 2D DIGITAL SCREENING BILATERAL MAMMOGRAM WITH CAD AND ADJUNCT TO
9 of 12 series · 9 of 28 positions shown · non-contrast
Comparison: Previous exam(s).

CLINICAL DATA: Screening.

EXAM:
2D DIGITAL SCREENING BILATERAL MAMMOGRAM WITH CAD AND ADJUNCT TOMO

[R CC]
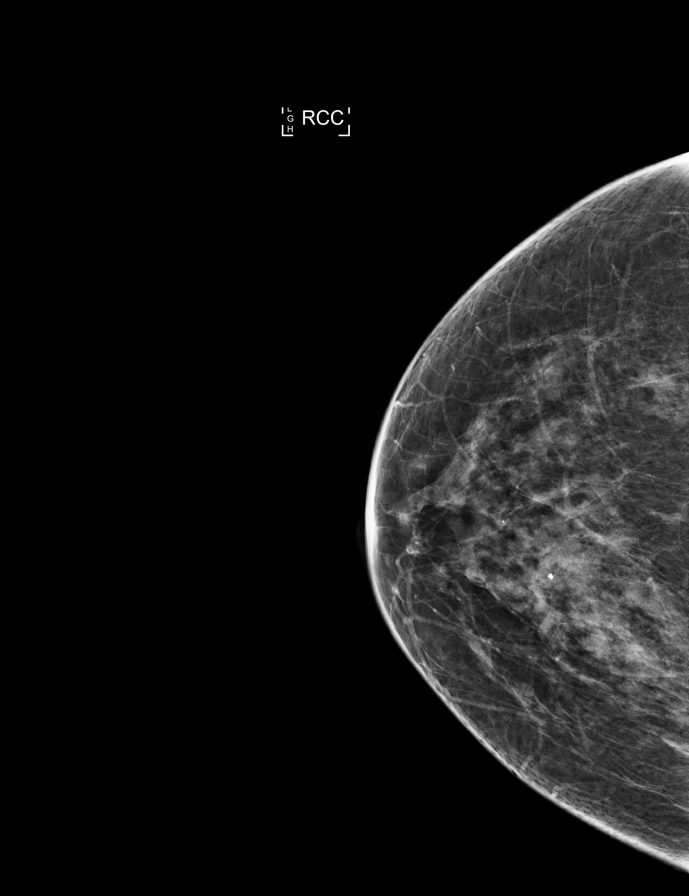

[L MLO synth-2D]
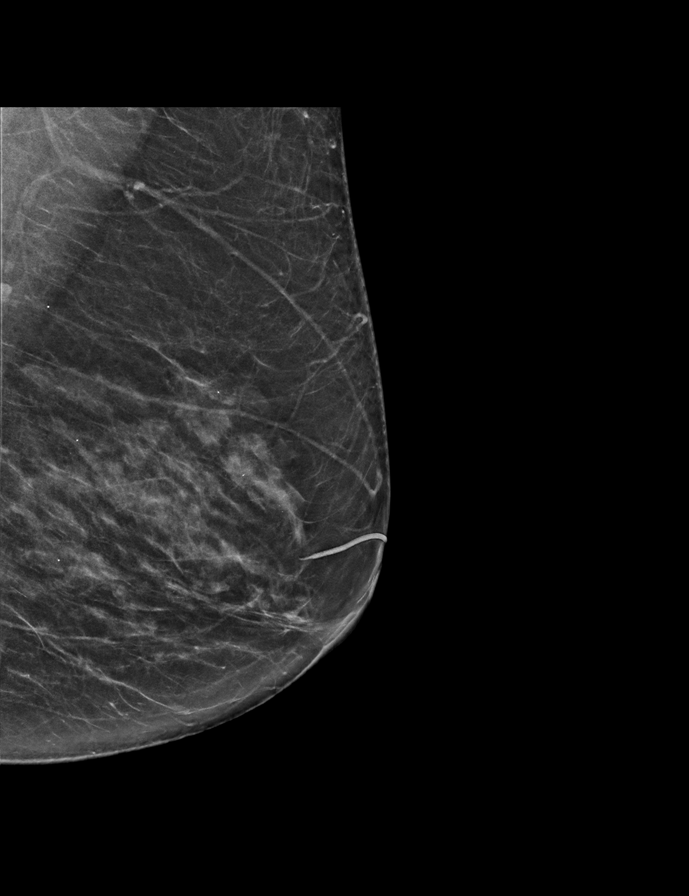

[R CC synth-2D]
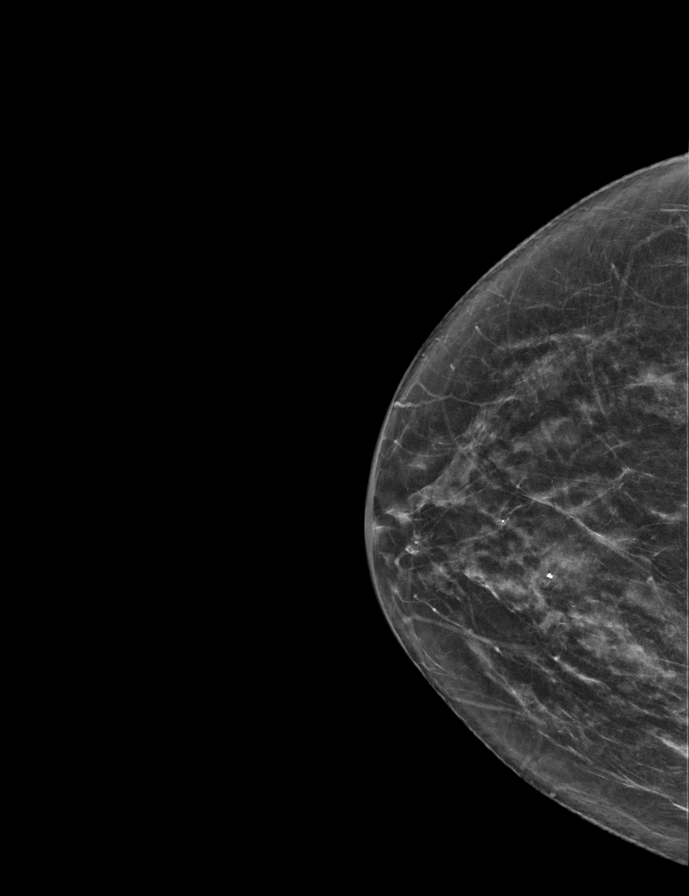

[R MLO]
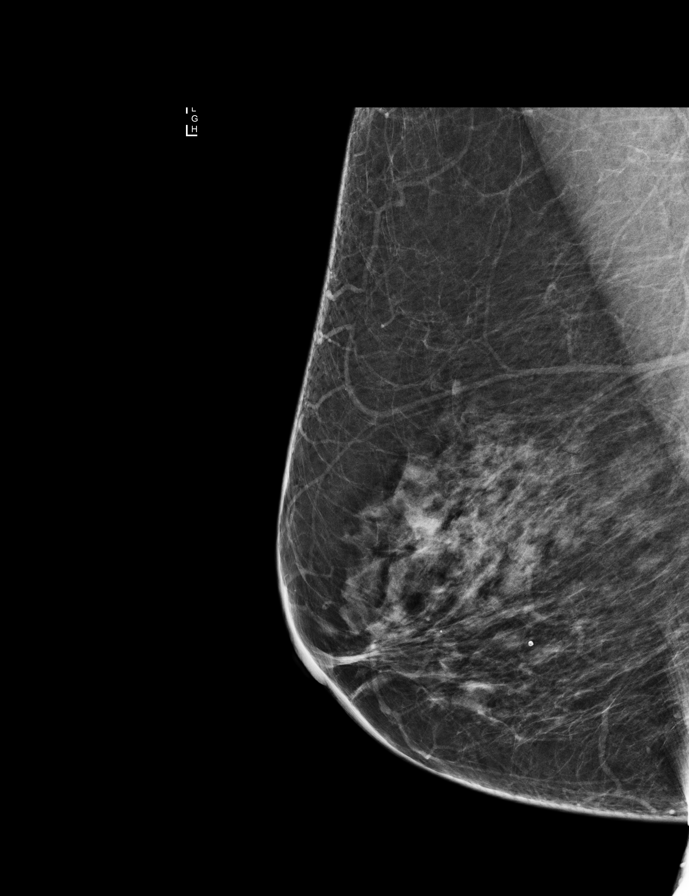

[L MLO]
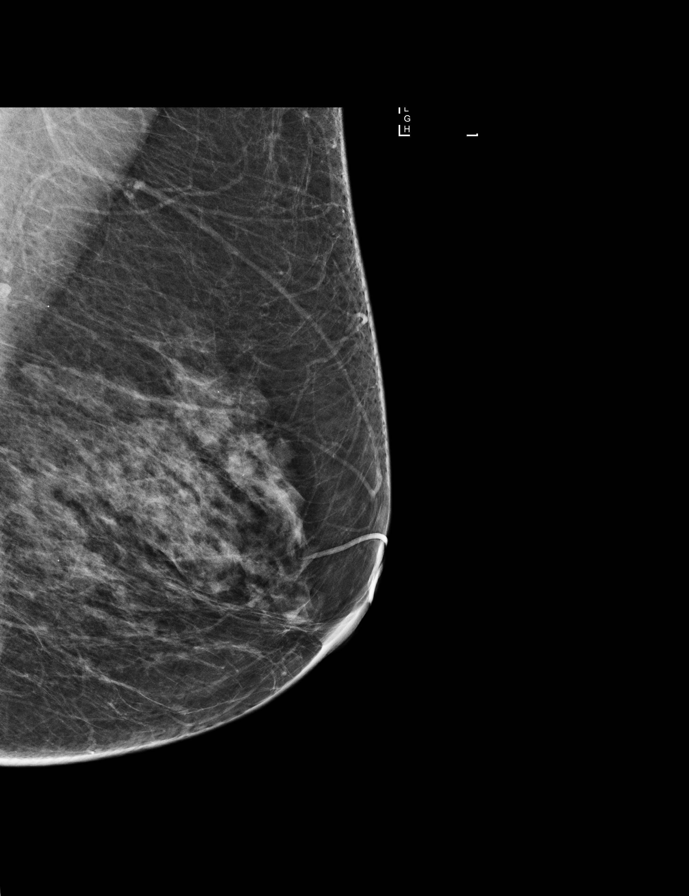

[L CC synth-2D]
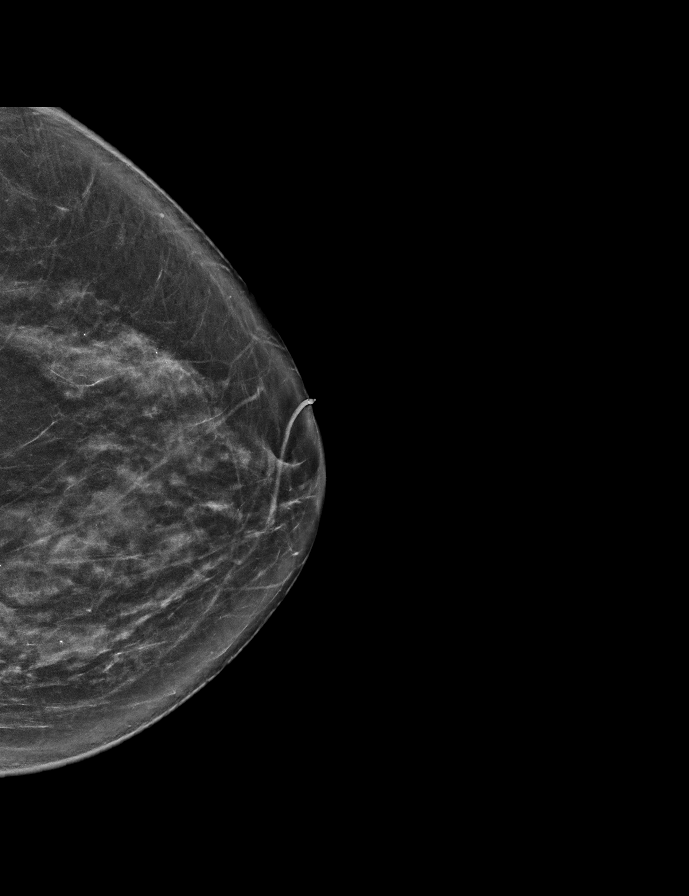

[R MLO synth-2D]
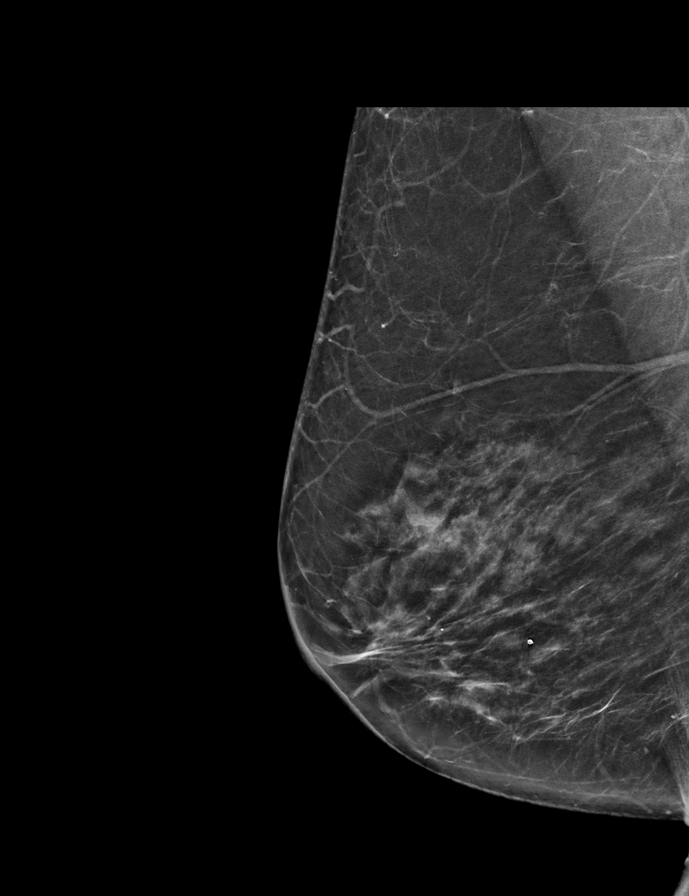

[L CC]
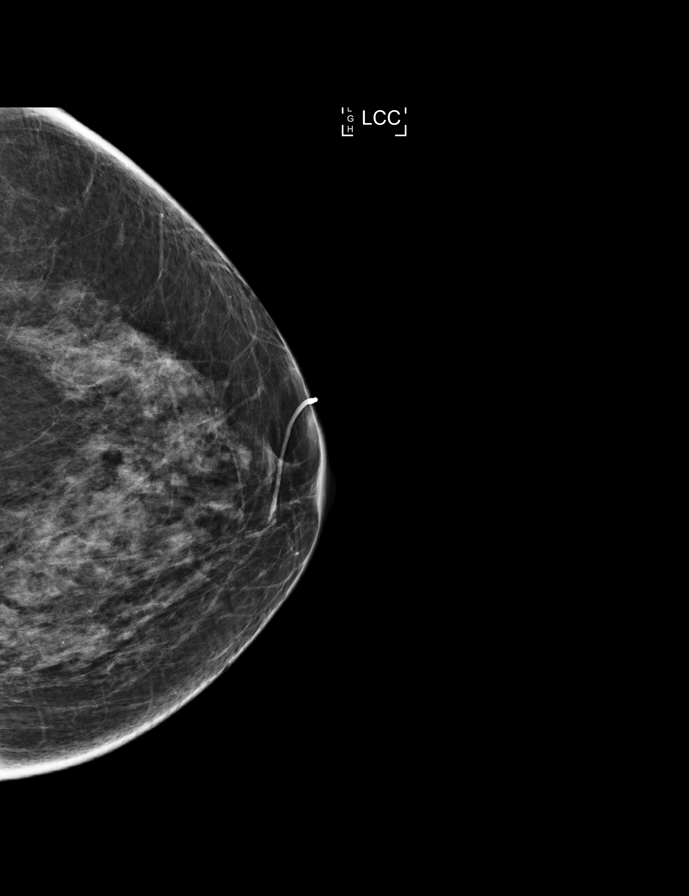

[L MLO tomo · tomo slice 31/61.0]
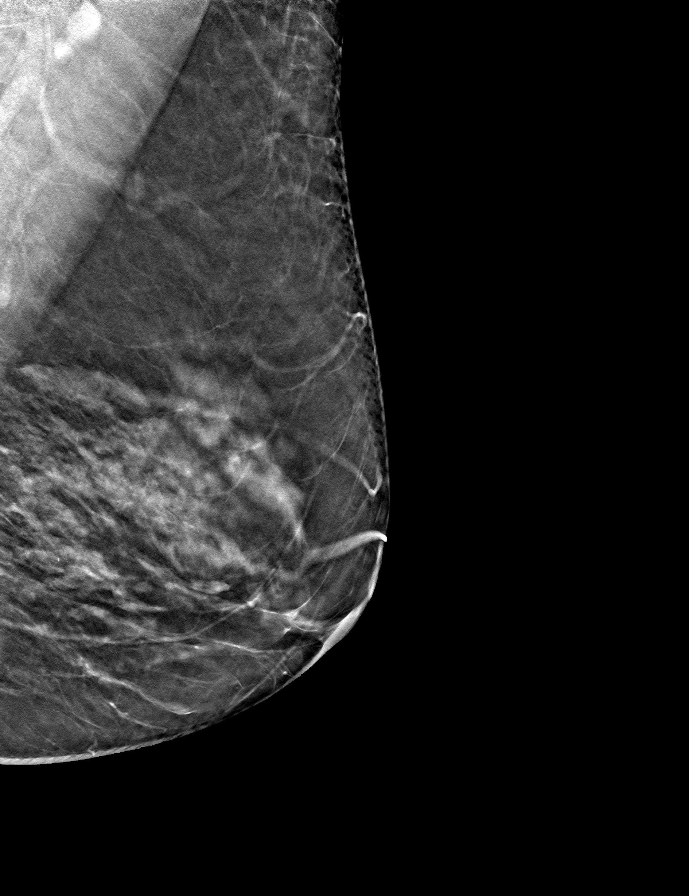

[9 of 28 positions shown; findings below may reference images not displayed]

ACR Breast Density Category c: The breast tissue is heterogeneously
dense, which may obscure small masses.
FINDINGS: There are no findings suspicious for malignancy. Images were
processed with CAD.
IMPRESSION: No mammographic evidence of malignancy. A result letter of this
screening mammogram will be mailed directly to the patient.

RECOMMENDATION:
Screening mammogram in one year. (Code:TN-[id number])

BI-RADS CATEGORY  1: Negative.

## 2019-03-16 ENCOUNTER — Other Ambulatory Visit: Payer: Self-pay | Admitting: *Deleted

## 2019-03-16 MED ORDER — LOSARTAN POTASSIUM-HCTZ 50-12.5 MG PO TABS: 1.0000 | ORAL_TABLET | Freq: Every day | ORAL | 0 refills | Status: DC

## 2019-04-05 ENCOUNTER — Other Ambulatory Visit: Payer: Self-pay | Admitting: Physician Assistant

## 2019-04-05 DIAGNOSIS — E7800 Pure hypercholesterolemia, unspecified: Secondary | ICD-10-CM

## 2019-04-05 DIAGNOSIS — E1059 Type 1 diabetes mellitus with other circulatory complications: Secondary | ICD-10-CM

## 2019-04-05 NOTE — Telephone Encounter (Signed)
Patient needs refill on medications pending with refills.

## 2019-04-06 MED ORDER — EZETIMIBE 10 MG PO TABS: 10.0000 mg | ORAL_TABLET | Freq: Every day | ORAL | 3 refills | Status: DC

## 2019-04-06 MED ORDER — ERGOCALCIFEROL 1.25 MG (50000 UT) PO CAPS: 50000.0000 [IU] | ORAL_CAPSULE | ORAL | 3 refills | Status: DC

## 2019-04-06 MED ORDER — METFORMIN HCL 1000 MG PO TABS: 1000.0000 mg | ORAL_TABLET | Freq: Two times a day (BID) | ORAL | Status: DC

## 2019-04-06 MED ORDER — LEVOTHYROXINE SODIUM 88 MCG PO TABS: 88.0000 ug | ORAL_TABLET | Freq: Every day | ORAL | 3 refills | Status: DC

## 2019-04-06 MED ORDER — FENOFIBRATE 160 MG PO TABS: 160.0000 mg | ORAL_TABLET | Freq: Every day | ORAL | 3 refills | Status: DC

## 2019-04-06 MED ORDER — LOSARTAN POTASSIUM-HCTZ 50-12.5 MG PO TABS: 1.0000 | ORAL_TABLET | Freq: Every day | ORAL | 0 refills | Status: DC

## 2019-04-06 MED ORDER — CLOPIDOGREL BISULFATE 75 MG PO TABS: 75.0000 mg | ORAL_TABLET | Freq: Every day | ORAL | 3 refills | Status: DC

## 2019-04-06 MED ORDER — PANTOPRAZOLE SODIUM 40 MG PO TBEC: 40.0000 mg | DELAYED_RELEASE_TABLET | Freq: Every day | ORAL | 3 refills | Status: DC

## 2019-04-07 ENCOUNTER — Telehealth: Payer: Self-pay | Admitting: *Deleted

## 2019-04-07 NOTE — Telephone Encounter (Signed)
Prior Auth for Humalog 100unit/ml cartridges- Approved 03/11/2019-03/09/2020  Key: [facility] PA case ID:  [id number]   Pharmacy Notified

## 2019-04-07 NOTE — Telephone Encounter (Signed)
Prior Auth In process

## 2019-04-07 NOTE — Telephone Encounter (Signed)
Prior Auth for Humalog 100unit/ml cartridges- In process  Key: [facility] PA case ID:  [id number]  Your information has been submitted to [facility] will review the request and will issue a decision, typically within 3-7 days from your submission. You can check the updated outcome later by reopening this request.  If [employer] has not responded in 3-7 days or if you have any questions about your ePA request, please contact [employer] at [phone]. If you think there may be a problem with your PA request, use our live chat feature at the bottom right.  For [place] requests, please call [phone].

## 2019-04-16 ENCOUNTER — Other Ambulatory Visit: Payer: Self-pay | Admitting: Physician Assistant

## 2019-04-16 DIAGNOSIS — E1059 Type 1 diabetes mellitus with other circulatory complications: Secondary | ICD-10-CM

## 2019-04-27 ENCOUNTER — Encounter: Payer: Self-pay | Admitting: Cardiovascular Disease

## 2019-04-27 NOTE — Telephone Encounter (Signed)
error 

## 2019-04-28 ENCOUNTER — Ambulatory Visit: Payer: Medicare HMO | Admitting: Cardiovascular Disease

## 2019-05-24 ENCOUNTER — Encounter: Payer: Self-pay | Admitting: Physician Assistant

## 2019-05-24 ENCOUNTER — Ambulatory Visit (INDEPENDENT_AMBULATORY_CARE_PROVIDER_SITE_OTHER): Payer: Medicare HMO | Admitting: Physician Assistant

## 2019-05-24 DIAGNOSIS — I10 Essential (primary) hypertension: Secondary | ICD-10-CM

## 2019-05-24 DIAGNOSIS — E1159 Type 2 diabetes mellitus with other circulatory complications: Secondary | ICD-10-CM

## 2019-05-24 DIAGNOSIS — I152 Hypertension secondary to endocrine disorders: Secondary | ICD-10-CM

## 2019-05-24 DIAGNOSIS — E1059 Type 1 diabetes mellitus with other circulatory complications: Secondary | ICD-10-CM

## 2019-05-24 MED ORDER — LOSARTAN POTASSIUM-HCTZ 50-12.5 MG PO TABS: 1.0000 | ORAL_TABLET | Freq: Every day | ORAL | 3 refills | Status: DC

## 2019-05-24 MED ORDER — INSULIN LISPRO 100 UNIT/ML CARTRIDGE: 12.0000 [IU] | Freq: Three times a day (TID) | SUBCUTANEOUS | 3 refills | Status: DC

## 2019-05-24 NOTE — Progress Notes (Signed)
10:07 AM no answer  10:08 AM no answer  10:28 AM     Telephone visit  Subjective: Jessica Torres on chronic medicaitons PCP: Terald Sleeper, PA-C CXK:[id number] Jessica Torres is a 79 y.o. female calls for telephone consult today. Patient provides verbal consent for consult held via phone.  Patient is identified with 2 separate identifiers.  At this time the entire area is on COVID-19 social distancing and stay home orders are in place.  Patient is of higher risk and therefore we are performing this by a virtual method.  Location of patient: home Location of provider: HOME Others present for call: no   Recheck on her diabetes.  She reports that most the time her fasting blood sugars are 180-200.  She is still only using 38 units of Toujeo.  A letter was sent to her stating that we wanted her to increase by 1 unit a day until her fasting blood sugars were close to 100.  We have gone over this again by telephone I do think she understands now.  She is going to make that increase.  In addition she is having some fasting blood sugars that can be 304 100.  She is only been using 12 units of Humalog at any given meal.  She notes that meals with potatoes, bread a.m., white bread always increase her sugar.  Of encouraged her to use at least 20 units at these high carb meals.  I have asked her to keep a log of this and plan to recheck her in the office in 6 weeks.  Also at that time we will have an A1c performed in the office.  She also has hypertension but is had very good readings.  She does need a refill on her losartan.  ROS: Per HPI  Allergies  Allergen Reactions  . Morphine And Related Swelling and Other (See Comments)    Facial swelling  . Latex Rash  . Statins Other (See Comments)    Myalgias    Past Medical History:  Diagnosis Date  . Arthritis    "fingers probably" (10/30/2016)  . Bulging lumbar disc   . Chronic lower back pain   . Difficulty urinating   . GERD (gastroesophageal  reflux disease)   . Heart murmur    hx  . Hyperlipidemia    statin intolerant  . Hypertension   . Hypothyroidism   . PAD (peripheral artery disease) ([name])   . Peripheral arterial disease ([name])   . Shortness of breath dyspnea   . Sleep apnea    "dx'Jessica years ago; had severe problems; all of the sudden it just went away" (07/06/2014) *still gone" (10/30/2016)  . Stroke [facility]) ~ 2010   denies residual on 10/30/2016  . Type 2 diabetes mellitus ([name])   . Urgency of urination   . Uterine cancer [name])     Current Outpatient Medications:  .  ACCU-CHEK AVIVA PLUS test strip, TEST BLOOD SUGAR FOUR TIMES DAILY, Disp: 400 strip, Rfl: 11 .  aspirin EC 81 MG tablet, Take 81 mg by mouth daily., Disp: , Rfl:  .  clopidogrel (PLAVIX) 75 MG tablet, Take 1 tablet (75 mg total) by mouth daily., Disp: 90 tablet, Rfl: 3 .  ergocalciferol (VITAMIN D2) 1.25 MG (50000 UT) capsule, Take 1 capsule (50,000 Units total) by mouth once a week. No specific day, Disp: 12 capsule, Rfl: 3 .  ezetimibe (ZETIA) 10 MG tablet, Take 1 tablet (10 mg total) by mouth daily., Disp: 90 tablet,  Rfl: 3 .  fenofibrate 160 MG tablet, Take 1 tablet (160 mg total) by mouth daily., Disp: 90 tablet, Rfl: 3 .  Insulin Glargine, 2 Unit Dial, (TOUJEO MAX SOLOSTAR) 300 UNIT/ML SOPN, Inject 38 Units into the skin daily., Disp: , Rfl:  .  insulin lispro (HUMALOG) 100 UNIT/ML cartridge, Inject 0.12 mLs (12 Units total) into the skin 3 (three) times daily before meals., Disp: 45 mL, Rfl: 3 .  levothyroxine (SYNTHROID) 88 MCG tablet, Take 1 tablet (88 mcg total) by mouth daily before breakfast., Disp: 90 tablet, Rfl: 3 .  losartan-hydrochlorothiazide (HYZAAR) 50-12.5 MG tablet, Take 1 tablet by mouth daily., Disp: 90 tablet, Rfl: 0 .  metFORMIN (GLUCOPHAGE) 1000 MG tablet, Take 1 tablet (1,000 mg total) by mouth 2 (two) times daily with a meal., Disp: , Rfl:  .  pantoprazole (PROTONIX) 40 MG tablet, Take 1 tablet (40 mg total) by mouth daily., Disp: 90  tablet, Rfl: 3  Assessment/ Plan: 79 y.o. female   1. Type 1 diabetes mellitus with other circulatory complication (HCC) Increase Humalog to 20 units at high carb meals - insulin lispro (HUMALOG) 100 UNIT/ML cartridge; Inject 0.12-0.2 mLs (12-20 Units total) into the skin 4 (four) times daily -  before meals and at bedtime.  Dispense: 45 mL; Refill: 3 Increase Toujeo by 1 unit/day until fasting blood sugars are between 101 150.  2. Hypertension associated with diabetes (HCC) - losartan-hydrochlorothiazide (HYZAAR) 50-12.5 MG tablet; Take 1 tablet by mouth daily.  Dispense: 90 tablet; Refill: 3   Recheck 6 weeks  Continue all other maintenance medications as listed above.  I discussed the assessment and treatment plan with the patient. The patient was provided an opportunity to ask questions and all were answered. The patient agreed with the plan and demonstrated an understanding of the instructions.   The patient was advised to call back or seek an in-person evaluation if the symptoms worsen or if the condition fails to improve as anticipated.   The above assessment and management plan was discussed with the patient. The patient verbalized understanding of and has agreed to the management plan. Patient is aware to call the clinic if symptoms persist or worsen. Patient is aware when to return to the clinic for a follow-up visit. Patient educated on when it is appropriate to go to the emergency department.    Start time: 10:28 AM End time: 10:50 AM  No orders of the defined types were placed in this encounter.   Particia Nearing PA-C [place] [phone]

## 2019-06-09 ENCOUNTER — Ambulatory Visit (INDEPENDENT_AMBULATORY_CARE_PROVIDER_SITE_OTHER): Payer: Medicare HMO | Admitting: Family

## 2019-06-09 ENCOUNTER — Encounter: Payer: Self-pay | Admitting: Family

## 2019-06-09 DIAGNOSIS — I152 Hypertension secondary to endocrine disorders: Secondary | ICD-10-CM

## 2019-06-09 DIAGNOSIS — E7800 Pure hypercholesterolemia, unspecified: Secondary | ICD-10-CM

## 2019-06-09 DIAGNOSIS — E1159 Type 2 diabetes mellitus with other circulatory complications: Secondary | ICD-10-CM

## 2019-06-09 DIAGNOSIS — I2510 Atherosclerotic heart disease of native coronary artery without angina pectoris: Secondary | ICD-10-CM

## 2019-06-09 DIAGNOSIS — I10 Essential (primary) hypertension: Secondary | ICD-10-CM | POA: Diagnosis not present

## 2019-06-09 DIAGNOSIS — K219 Gastro-esophageal reflux disease without esophagitis: Secondary | ICD-10-CM | POA: Diagnosis not present

## 2019-06-09 DIAGNOSIS — I2583 Coronary atherosclerosis due to lipid rich plaque: Secondary | ICD-10-CM

## 2019-06-09 DIAGNOSIS — E039 Hypothyroidism, unspecified: Secondary | ICD-10-CM

## 2019-06-09 DIAGNOSIS — Z794 Long term (current) use of insulin: Secondary | ICD-10-CM

## 2019-06-09 MED ORDER — TOUJEO MAX SOLOSTAR 300 UNIT/ML ~~LOC~~ SOPN: 39.0000 [IU] | PEN_INJECTOR | Freq: Every day | SUBCUTANEOUS | 2 refills | Status: DC

## 2019-06-09 MED ORDER — INSULIN LISPRO (1 UNIT DIAL) 100 UNIT/ML (KWIKPEN): 20.0000 [IU] | PEN_INJECTOR | Freq: Three times a day (TID) | SUBCUTANEOUS | 11 refills | Status: DC

## 2019-06-09 NOTE — Progress Notes (Signed)
Virtual Visit via telephone Note Due to COVID-19 pandemic this visit was conducted virtually. This visit type was conducted due to national recommendations for restrictions regarding the COVID-19 Pandemic (e.g. social distancing, sheltering in place) in an effort to limit this patient's exposure and mitigate transmission in our community. All issues noted in this document were discussed and addressed.  A physical exam was not performed with this format.  I connected with Jessica Torres on 06/09/19 at 11:20 AM by telephone and verified that I am speaking with the correct person using two identifiers. Jessica Torres is currently located at home and no one is currently with her during visit. The provider, [name], FNP is located in their office at time of visit.  I discussed the limitations, risks, security and privacy concerns of performing an evaluation and management service by telephone and the availability of in person appointments. I also discussed with the patient that there may be a patient responsible charge related to this service. The patient expressed understanding and agreed to proceed.   History and Present Illness:  Diabetes She presents for her follow-up diabetic visit. She has type 2 diabetes mellitus. Her disease course has been stable. Associated symptoms include foot paresthesias. Pertinent negatives for diabetes include no blurred vision and no visual change. Pertinent negatives for hypoglycemia complications include no blackouts and no hospitalization. Symptoms are stable. Diabetic complications include heart disease, peripheral neuropathy and PVD. Pertinent negatives for diabetic complications include no CVA. Risk factors for coronary artery disease include dyslipidemia, diabetes mellitus, hypertension, post-menopausal and sedentary lifestyle. She is following a diabetic diet. Her overall blood glucose range is >200 mg/dl. An ACE inhibitor/angiotensin II receptor blocker is  being taken. Eye exam is current.  Hyperlipidemia This is a chronic problem. The current episode started more than 1 year ago. The problem is uncontrolled. Recent lipid tests were reviewed and are high. Pertinent negatives include no shortness of breath. Current antihyperlipidemic treatment includes ezetimibe and fibric acid derivatives. The current treatment provides moderate improvement of lipids. Risk factors for coronary artery disease include hypertension, a sedentary lifestyle, post-menopausal, obesity, dyslipidemia and diabetes mellitus.  Hypertension This is a chronic problem. The current episode started more than 1 year ago. The problem has been waxing and waning since onset. The problem is controlled. Associated symptoms include malaise/fatigue. Pertinent negatives include no blurred vision, peripheral edema or shortness of breath. Risk factors for coronary artery disease include dyslipidemia, diabetes mellitus and obesity. The current treatment provides moderate improvement. Hypertensive end-organ damage includes PVD. There is no history of kidney disease or CVA.  Gastroesophageal Reflux She complains of belching and heartburn. This is a chronic problem. The current episode started more than 1 year ago. The problem occurs occasionally. The problem has been waxing and waning. The symptoms are aggravated by certain foods. Risk factors include obesity. She has tried a PPI for the symptoms. The treatment provided moderate relief.      Review of Systems  Constitutional: Positive for malaise/fatigue.  Eyes: Negative for blurred vision.  Respiratory: Negative for shortness of breath.   Gastrointestinal: Positive for heartburn.  All other systems reviewed and are negative.    Observations/Objective: No SOB or distress noted   Assessment and Plan: ARNELL MAUSOLF comes in today with chief complaint of No chief complaint on file.   Diagnosis and orders addressed:  1. Coronary artery  disease due to lipid rich plaque - CMP14+EGFR; Future - CBC with Differential/Platelet; Future - Lipid panel; Future  2. Hypertension associated with diabetes ([place]) - CMP14+EGFR; Future - CBC with Differential/Platelet; Future  3. Gastroesophageal reflux disease without esophagitis - CMP14+EGFR; Future - CBC with Differential/Platelet; Future  4. Hypothyroidism, unspecified type - CMP14+EGFR; Future - CBC with Differential/Platelet; Future - TSH; Future  5. Type 2 diabetes mellitus with other circulatory complication, with long-term current use of insulin (HCC) - Bayer DCA Hb A1c Waived; Future - CMP14+EGFR; Future - CBC with Differential/Platelet; Future - Microalbumin / creatinine urine ratio; Future - insulin glargine, 2 Unit Dial, (TOUJEO MAX SOLOSTAR) 300 UNIT/ML Solostar Pen; Inject 39 Units into the skin daily.  Dispense: 12 pen; Refill: 2 - insulin lispro (HUMALOG KWIKPEN) 100 UNIT/ML KwikPen; Inject 0.2 mLs (20 Units total) into the skin 3 (three) times daily.  Dispense: 15 mL; Refill: 11  6. Pure hypercholesterolemia    Labs pending Health Maintenance reviewed Diet and exercise encouraged  Follow up plan: 2 months      I discussed the assessment and treatment plan with the patient. The patient was provided an opportunity to ask questions and all were answered. The patient agreed with the plan and demonstrated an understanding of the instructions.   The patient was advised to call back or seek an in-person evaluation if the symptoms worsen or if the condition fails to improve as anticipated.  The above assessment and management plan was discussed with the patient. The patient verbalized understanding of and has agreed to the management plan. Patient is aware to call the clinic if symptoms persist or worsen. Patient is aware when to return to the clinic for a follow-up visit. Patient educated on when it is appropriate to go to the emergency department.   Time  call ended:  11:50 pm  I provided 30 minutes of non-face-to-face time during this encounter.    [name], FNP

## 2019-06-13 ENCOUNTER — Telehealth: Payer: Self-pay | Admitting: Family

## 2019-06-13 DIAGNOSIS — Z794 Long term (current) use of insulin: Secondary | ICD-10-CM

## 2019-06-13 DIAGNOSIS — E1159 Type 2 diabetes mellitus with other circulatory complications: Secondary | ICD-10-CM

## 2019-06-13 NOTE — Telephone Encounter (Signed)
Left message for patient describing change in prescription.  Insurance would not cover the 39 unit dose.   Dose was decreased to 38 units and message was sent to provider for advice.

## 2019-06-13 NOTE — Telephone Encounter (Signed)
Pt says that [employer] they do not have rx for  insulin lispro (HUMALOG KWIKPEN) 100 UNIT/ML KwikPen and insulin glargine, 2 Unit Dial, (TOUJEO MAX SOLOSTAR) 300 UNIT/ML Solostar Pen. It needs to be called in again. Please call back when done

## 2019-06-13 NOTE — Telephone Encounter (Signed)
Juluis Rainier, Spoke with [facility] pharmacist. Nelva Nay must be written in increments of 2.  Changed dose to 38 units so it could be processed through insurance and sent to patient.  Please advise if this needs changing.

## 2019-06-14 NOTE — Telephone Encounter (Signed)
Ok

## 2019-06-15 ENCOUNTER — Ambulatory Visit: Payer: Medicare HMO

## 2019-06-15 NOTE — Telephone Encounter (Signed)
Dose changed in system

## 2019-07-11 ENCOUNTER — Ambulatory Visit: Payer: Medicare HMO | Admitting: Family

## 2019-07-13 ENCOUNTER — Encounter: Payer: Self-pay | Admitting: Family

## 2019-07-13 ENCOUNTER — Other Ambulatory Visit: Payer: Self-pay

## 2019-07-13 ENCOUNTER — Ambulatory Visit (INDEPENDENT_AMBULATORY_CARE_PROVIDER_SITE_OTHER): Payer: Medicare HMO | Admitting: Family

## 2019-07-13 VITALS — BP 131/74 | HR 84 | Temp 97.4°F | Ht 65.0 in | Wt 145.2 lb

## 2019-07-13 DIAGNOSIS — I2583 Coronary atherosclerosis due to lipid rich plaque: Secondary | ICD-10-CM

## 2019-07-13 DIAGNOSIS — K219 Gastro-esophageal reflux disease without esophagitis: Secondary | ICD-10-CM | POA: Diagnosis not present

## 2019-07-13 DIAGNOSIS — I152 Hypertension secondary to endocrine disorders: Secondary | ICD-10-CM

## 2019-07-13 DIAGNOSIS — Z794 Long term (current) use of insulin: Secondary | ICD-10-CM

## 2019-07-13 DIAGNOSIS — I739 Peripheral vascular disease, unspecified: Secondary | ICD-10-CM

## 2019-07-13 DIAGNOSIS — M545 Low back pain, unspecified: Secondary | ICD-10-CM | POA: Insufficient documentation

## 2019-07-13 DIAGNOSIS — E1159 Type 2 diabetes mellitus with other circulatory complications: Secondary | ICD-10-CM | POA: Diagnosis not present

## 2019-07-13 DIAGNOSIS — I10 Essential (primary) hypertension: Secondary | ICD-10-CM | POA: Diagnosis not present

## 2019-07-13 DIAGNOSIS — G8929 Other chronic pain: Secondary | ICD-10-CM | POA: Insufficient documentation

## 2019-07-13 DIAGNOSIS — E039 Hypothyroidism, unspecified: Secondary | ICD-10-CM

## 2019-07-13 DIAGNOSIS — I2510 Atherosclerotic heart disease of native coronary artery without angina pectoris: Secondary | ICD-10-CM | POA: Diagnosis not present

## 2019-07-13 LAB — BAYER DCA HB A1C WAIVED: HB A1C (BAYER DCA - WAIVED): 10.4 % — ABNORMAL HIGH (ref <7.0)

## 2019-07-13 MED ORDER — BD PEN NEEDLE NANO 2ND GEN 32G X 4 MM MISC: 1.0000 "application " | Freq: Two times a day (BID) | 6 refills | Status: DC

## 2019-07-13 MED ORDER — BACLOFEN 10 MG PO TABS: 10.0000 mg | ORAL_TABLET | Freq: Three times a day (TID) | ORAL | 2 refills | Status: DC

## 2019-07-13 MED ORDER — TRULICITY 0.75 MG/0.5ML ~~LOC~~ SOAJ: 0.7500 mg | SUBCUTANEOUS | 3 refills | Status: DC

## 2019-07-13 NOTE — Patient Instructions (Signed)

## 2019-07-13 NOTE — Progress Notes (Addendum)
Subjective:    Patient ID: Jessica Torres, female    DOB: 1940/12/28, 79 y.o.   MRN: [id number]  Chief Complaint  Patient presents with  . Medical Management of Chronic Issues   Pt presents to the office today for chronic follow up.  Hypertension This is a chronic problem. The current episode started more than 1 year ago. The problem has been resolved since onset. The problem is controlled. Associated symptoms include malaise/fatigue. Pertinent negatives include no peripheral edema. Risk factors for coronary artery disease include dyslipidemia, obesity and sedentary lifestyle. The current treatment provides moderate improvement. Hypertensive end-organ damage includes CVA. Identifiable causes of hypertension include a thyroid problem.  Hyperlipidemia This is a chronic problem. The current episode started more than 1 year ago. The problem is uncontrolled. Recent lipid tests were reviewed and are high. Exacerbating diseases include obesity. Current antihyperlipidemic treatment includes ezetimibe. The current treatment provides mild improvement of lipids. Risk factors for coronary artery disease include dyslipidemia, hypertension and a sedentary lifestyle.  Gastroesophageal Reflux She complains of belching and heartburn. This is a chronic problem. The current episode started more than 1 year ago. The problem occurs frequently. Pertinent negatives include no fatigue. Risk factors include obesity. She has tried a PPI for the symptoms. The treatment provided mild relief.  Thyroid Problem Presents for follow-up visit. Patient reports no constipation, diarrhea or fatigue. The symptoms have been stable. Her past medical history is significant for hyperlipidemia.  Diabetes She presents for her follow-up diabetic visit. She has type 2 diabetes mellitus. There are no hypoglycemic associated symptoms. Pertinent negatives for diabetes include no fatigue. Symptoms are stable. Diabetic complications include a  CVA, heart disease and peripheral neuropathy. Risk factors for coronary artery disease include diabetes mellitus, dyslipidemia, hypertension, sedentary lifestyle and post-menopausal. Her overall blood glucose range is 140-180 mg/dl.  Back Pain This is a chronic problem. The current episode started more than 1 year ago. The problem occurs intermittently. The problem has been waxing and waning since onset. The pain is present in the lumbar spine. The quality of the pain is described as aching. The pain is at a severity of 8/10. The pain is moderate. She has tried bed rest and NSAIDs for the symptoms. The treatment provided mild relief.      Review of Systems  Constitutional: Positive for malaise/fatigue. Negative for fatigue.  Gastrointestinal: Positive for heartburn. Negative for constipation and diarrhea.  Musculoskeletal: Positive for back pain.  All other systems reviewed and are negative.      Objective:   Physical Exam Vitals reviewed.  Constitutional:      General: She is not in acute distress.    Appearance: She is well-developed.  HENT:     Head: Normocephalic and atraumatic.     Right Ear: Tympanic membrane normal.     Left Ear: Tympanic membrane normal.  Eyes:     Pupils: Pupils are equal, round, and reactive to light.  Neck:     Thyroid: No thyromegaly.  Cardiovascular:     Rate and Rhythm: Normal rate and regular rhythm.     Heart sounds: Normal heart sounds. No murmur.  Pulmonary:     Effort: Pulmonary effort is normal. No respiratory distress.     Breath sounds: Normal breath sounds. No wheezing.  Abdominal:     General: Bowel sounds are normal. There is no distension.     Palpations: Abdomen is soft.     Tenderness: There is no abdominal tenderness.  Musculoskeletal:  General: No tenderness. Normal range of motion.     Cervical back: Normal range of motion and neck supple.  Skin:    General: Skin is warm and dry.  Neurological:     Mental Status: She  is alert and oriented to person, place, and time.     Cranial Nerves: No cranial nerve deficit.     Deep Tendon Reflexes: Reflexes are normal and symmetric.  Psychiatric:        Behavior: Behavior normal.        Thought Content: Thought content normal.        Judgment: Judgment normal.       BP 131/74   Pulse 84   Temp (!) 97.4 F (36.3 C) (Temporal)   Ht 5' 5"  (1.651 m)   Wt 145 lb 3.2 oz (65.9 kg)   SpO2 97%   BMI 24.16 kg/m      Assessment & Plan:  Jessica Torres comes in today with chief complaint of Medical Management of Chronic Issues   Diagnosis and orders addressed:  1. Type 2 diabetes mellitus with other circulatory complication, with long-term current use of insulin (HCC) Will stop metformin related to GFR Will add Trulicity today Strict low carb diet She will schedule patient with clinical pharm - Microalbumin / creatinine urine ratio - CBC with Differential/Platelet - CMP14+EGFR - Bayer DCA Hb A1c Waived - BD PEN NEEDLE NANO 2ND GEN 32G X 4 MM MISC; Inject 1 application into the skin in the morning and at bedtime.  Dispense: 100 each; Refill: 6 - Dulaglutide (TRULICITY) 0.34 VQ/2.5ZD SOPN; Inject 0.75 mg into the skin once a week.  Dispense: 12 pen; Refill: 3  2. Hypothyroidism, unspecified type - TSH - CBC with Differential/Platelet - CMP14+EGFR  3. Coronary artery disease due to lipid rich plaque - Lipid panel - CBC with Differential/Platelet - CMP14+EGFR  4. Hypertension associated with diabetes ([place]) - CBC with Differential/Platelet - CMP14+EGFR  5. Gastroesophageal reflux disease without esophagitis - CBC with Differential/Platelet - CMP14+EGFR  6. Peripheral arterial disease - Bilateral SFA  -She will make Vascular appointment  7. Claudication in peripheral vascular disease ([place])  8. Chronic bilateral low back pain without sciatica    Labs pending Health Maintenance reviewed Diet and exercise encouraged  Follow up plan: 2  months    [name], FNP

## 2019-07-14 ENCOUNTER — Ambulatory Visit (INDEPENDENT_AMBULATORY_CARE_PROVIDER_SITE_OTHER): Payer: Medicare HMO | Admitting: Pharmacist

## 2019-07-14 ENCOUNTER — Other Ambulatory Visit: Payer: Self-pay | Admitting: Family

## 2019-07-14 VITALS — BP 130/75 | HR 88

## 2019-07-14 DIAGNOSIS — Z794 Long term (current) use of insulin: Secondary | ICD-10-CM

## 2019-07-14 DIAGNOSIS — E1159 Type 2 diabetes mellitus with other circulatory complications: Secondary | ICD-10-CM

## 2019-07-14 LAB — CBC WITH DIFFERENTIAL/PLATELET
Basophils Absolute: 0.1 10*3/uL (ref 0.0–0.2)
Basos: 1 %
EOS (ABSOLUTE): 0.4 10*3/uL (ref 0.0–0.4)
Eos: 3 %
Hematocrit: 39.8 % (ref 34.0–46.6)
Hemoglobin: 12.8 g/dL (ref 11.1–15.9)
Immature Grans (Abs): 0 10*3/uL (ref 0.0–0.1)
Immature Granulocytes: 0 %
Lymphocytes Absolute: 2.1 10*3/uL (ref 0.7–3.1)
Lymphs: 21 %
MCH: 29 pg (ref 26.6–33.0)
MCHC: 32.2 g/dL (ref 31.5–35.7)
MCV: 90 fL (ref 79–97)
Monocytes Absolute: 0.8 10*3/uL (ref 0.1–0.9)
Monocytes: 8 %
Neutrophils Absolute: 6.7 10*3/uL (ref 1.4–7.0)
Neutrophils: 67 %
Platelets: 324 10*3/uL (ref 150–450)
RBC: 4.42 x10E6/uL (ref 3.77–5.28)
RDW: 12.8 % (ref 11.7–15.4)
WBC: 10.2 10*3/uL (ref 3.4–10.8)

## 2019-07-14 LAB — CMP14+EGFR
ALT: 11 IU/L (ref 0–32)
AST: 22 IU/L (ref 0–40)
Albumin/Globulin Ratio: 1.8 (ref 1.2–2.2)
Albumin: 4.1 g/dL (ref 3.7–4.7)
Alkaline Phosphatase: 55 IU/L (ref 39–117)
BUN/Creatinine Ratio: 12 (ref 12–28)
BUN: 19 mg/dL (ref 8–27)
Bilirubin Total: 0.3 mg/dL (ref 0.0–1.2)
CO2: 19 mmol/L — ABNORMAL LOW (ref 20–29)
Calcium: 10 mg/dL (ref 8.7–10.3)
Chloride: 102 mmol/L (ref 96–106)
Creatinine, Ser: 1.54 mg/dL — ABNORMAL HIGH (ref 0.57–1.00)
GFR calc Af Amer: 38 mL/min/{1.73_m2} — ABNORMAL LOW (ref >59)
GFR calc non Af Amer: 33 mL/min/{1.73_m2} — ABNORMAL LOW (ref >59)
Globulin, Total: 2.3 g/dL (ref 1.5–4.5)
Glucose: 149 mg/dL — ABNORMAL HIGH (ref 65–99)
Potassium: 4.5 mmol/L (ref 3.5–5.2)
Sodium: 136 mmol/L (ref 134–144)
Total Protein: 6.4 g/dL (ref 6.0–8.5)

## 2019-07-14 LAB — LIPID PANEL
Chol/HDL Ratio: 4.1 ratio (ref 0.0–4.4)
Cholesterol, Total: 186 mg/dL (ref 100–199)
HDL: 45 mg/dL (ref >39)
LDL Chol Calc (NIH): 108 mg/dL — ABNORMAL HIGH (ref 0–99)
Triglycerides: 188 mg/dL — ABNORMAL HIGH (ref 0–149)
VLDL Cholesterol Cal: 33 mg/dL (ref 5–40)

## 2019-07-14 LAB — MICROALBUMIN / CREATININE URINE RATIO
Creatinine, Urine: 56.3 mg/dL
Microalb/Creat Ratio: 31 mg/g creat — ABNORMAL HIGH (ref 0–29)
Microalbumin, Urine: 17.2 ug/mL

## 2019-07-14 LAB — TSH: TSH: 1.69 u[IU]/mL (ref 0.450–4.500)

## 2019-07-21 ENCOUNTER — Encounter: Payer: Self-pay | Admitting: Pharmacist

## 2019-07-21 ENCOUNTER — Ambulatory Visit (INDEPENDENT_AMBULATORY_CARE_PROVIDER_SITE_OTHER): Payer: Medicare HMO | Admitting: Pharmacist

## 2019-07-21 DIAGNOSIS — Z794 Long term (current) use of insulin: Secondary | ICD-10-CM | POA: Diagnosis not present

## 2019-07-21 DIAGNOSIS — E1159 Type 2 diabetes mellitus with other circulatory complications: Secondary | ICD-10-CM

## 2019-07-21 MED ORDER — ACCU-CHEK SOFTCLIX LANCETS MISC: 12 refills | Status: DC

## 2019-07-21 NOTE — Progress Notes (Signed)
   07/14/2019 Name: Jessica Torres MRN: [id number] DOB: 01-Jun-1940  Referred by: Sharion Balloon, FNP Reason for referral : Diabetes S:  65 yoF presents to Pharmacy Clinic for diabetes evaluation, education, and management.  Patient was referred and last seen by Primary Care Provider on 07/13/19. Insurance coverage/medication affordability: [employer]   Patient is MOSTLY adherent with medications.  Current diabetes medications include: Toujeo insulin pen 39 units qHS, metformin BID, Humalog meal time coverage (forgets doses)  Current hypertension medications include: losartan/hctz  Current hyperlipidemia medications include: Zetia, fenofibrate (statin intolerance) LDL on 07/13/19 = 108   Patient denies hypoglycemic events.  Patient reported dietary habits: Eats 2-3 meals/day; states she cooks and prefers a [employer]: biscuits, bacon, eggs  Lunch/dinner: sandwiches, casseroles, southern food  Drinks:encouraged water, decrease sugary drinks  Patient-reported exercise habits: n/a  O:   Lab Results  Component Value Date   HGBA1C 10.4 (H) 07/13/2019   Lipid Panel     Component Value Date/Time   CHOL 186 07/13/2019 1037   TRIG 188 (H) 07/13/2019 1037   HDL 45 07/13/2019 1037   CHOLHDL 4.1 07/13/2019 1037   LDLCALC 108 (H) 07/13/2019 1037     Home fasting blood sugars: 200-300s  2 hour post-meal/random blood sugars: 300-400    A/P:  Diabetes T2DM currently UNCONTROLLED. Patient is able to verbalize appropriate hypoglycemia management plan. Patient is sometimes adherent with medication. Control is suboptimal due to diet/lifestyle/missed medication doses.  GOAL: reduce pill burden/injections to improve compliance  -Continued basal insulin Toujeo (insulin glargine) continue 40 units qHS.   -Discontinued rapid insulin Humalog (insulin lispro)   -called [employer] to cancel RX on file  -Discontinued metformin    -called [employer] to cancel RX on  file  -Started GLP-1 TRULICITY (generic name: dulaglutide) 0.75mg  sq weekly  -Trulicity 0.75mg  sample given to patient 2-pens (#1-box) = 14 day supply until shipment arrives  -DENIES personal history or family history of thyroid cancer/MTC or Multiple Endocrine Neoplasia syndrome type 2   -Trulicity RX sent to [facility] mail order per patient request  -Extensively discussed pathophysiology of diabetes, recommended lifestyle interventions, dietary effects on blood sugar control  -Counseled on s/sx of and management of hypoglycemia  -Next A1C anticipated August 2021.   Written patient instructions provided.  Total time in face to face counseling 34 minutes.   Follow up Pharmacist Clinic Visit in 2 WEEKS.      Regina Eck, PharmD, BCPS Clinical Pharmacist, [place]  II Phone [phone]

## 2019-07-22 NOTE — Progress Notes (Signed)
07/14/2019 Name: Jessica Torres        MRN: [id number]       DOB: [birth year]  Referred by: Sharion Balloon, FNP Reason for referral : Diabetes  S:  22 yoF  presents to Pharmacy Clinic for follow up diabetes evaluation, education, and management.  Patient was referred and last seen by Primary Care Provider on 07/13/19. Insurance coverage/medication affordability: [employer]   Patient is MOSTLY adherent with medications.  Current diabetes medications include: Toujeo insulin pen 40 units qHS, Trulicity 0.75mg  sq weekly  Current hypertension medications include: losartan/hctz  Current hyperlipidemia medications include: Zetia, fenofibrate (statin intolerance) LDL on 07/13/19 = 108   Patient denies hypoglycemic events.  Patient reported dietary habits: Eats 2-3 meals/day; states she cooks and prefers a [employer]: biscuits, bacon, eggs  Lunch/dinner: sandwiches, casseroles, southern food  Drinks:encouraged water, decrease sugary drinks  Patient-reported exercise habits: n/a  O:   Recent Labs       Lab Results  Component Value Date   HGBA1C 10.4 (H) 07/13/2019     Lipid Panel  Labs (Brief)          Component Value Date/Time   CHOL 186 07/13/2019 1037   TRIG 188 (H) 07/13/2019 1037   HDL 45 07/13/2019 1037   CHOLHDL 4.1 07/13/2019 1037   LDLCALC 108 (H) 07/13/2019 1037     Home fasting blood sugars: 115-150  2 hour post-meal/random blood sugars: ~180-200    A/P:  Diabetes T2DM currently UNCONTROLLED, however much improved over the course of 1 week. Patient is able to verbalize appropriate hypoglycemia management plan. Patient is sometimes adherent with medication. Control is suboptimal due to diet/lifestyle/missed medication doses.  GOAL: reduce pill burden/injections to improve compliance  -Continue basal insulin Toujeo (insulin glargine) continue 40 units qHS.   -Continue GLP-1 TRULICITY (generic name: dulaglutide) 0.75mg  sq  weekly             -Trulicity 0.75mg  sample given to patient 2-pens (#1-box) = 14 day supply at last visit until shipment arrives   Call placed to [employer]-Trulicity is scheduled to be delivered this evening (90 day supply)  -PLAN to increase to 1.5mg  and simultaneously reduce basal insulin as able             -DENIES personal history or family history of thyroid cancer/MTC or Multiple Endocrine Neoplasia syndrome type 2              -Trulicity RX sent to [facility] mail order per patient request  -RX for AccuCheck MulitCLIX lancets sent in to [facility] mail order per patient request  -Extensively discussed pathophysiology of diabetes, recommended lifestyle interventions, dietary effects on blood sugar control  -Counseled on s/sx of and management of hypoglycemia  -Next A1C anticipated August 2021.   Written patient instructions provided.  Total time in face to face counseling 18 minutes.   Follow up Pharmacist Clinic Visit in 1 month.      Regina Eck, PharmD, BCPS Clinical Pharmacist, [place]  II Phone [phone]

## 2019-07-26 ENCOUNTER — Ambulatory Visit: Payer: Medicare HMO | Admitting: Cardiovascular Disease

## 2019-07-26 ENCOUNTER — Encounter: Payer: Self-pay | Admitting: Cardiovascular Disease

## 2019-07-26 ENCOUNTER — Other Ambulatory Visit: Payer: Self-pay

## 2019-07-26 VITALS — BP 130/60 | HR 87 | Ht 65.0 in | Wt 143.0 lb

## 2019-07-26 DIAGNOSIS — I739 Peripheral vascular disease, unspecified: Secondary | ICD-10-CM | POA: Diagnosis not present

## 2019-07-26 DIAGNOSIS — I2583 Coronary atherosclerosis due to lipid rich plaque: Secondary | ICD-10-CM

## 2019-07-26 DIAGNOSIS — I6521 Occlusion and stenosis of right carotid artery: Secondary | ICD-10-CM

## 2019-07-26 DIAGNOSIS — I2510 Atherosclerotic heart disease of native coronary artery without angina pectoris: Secondary | ICD-10-CM | POA: Diagnosis not present

## 2019-07-26 DIAGNOSIS — E7800 Pure hypercholesterolemia, unspecified: Secondary | ICD-10-CM | POA: Diagnosis not present

## 2019-07-26 DIAGNOSIS — E1159 Type 2 diabetes mellitus with other circulatory complications: Secondary | ICD-10-CM | POA: Diagnosis not present

## 2019-07-26 DIAGNOSIS — I10 Essential (primary) hypertension: Secondary | ICD-10-CM

## 2019-07-26 DIAGNOSIS — I152 Hypertension secondary to endocrine disorders: Secondary | ICD-10-CM

## 2019-07-26 NOTE — Patient Instructions (Signed)
Medication Instructions:  Your physician recommends that you continue on your current medications as directed. Please refer to the Current Medication list given to you today.  *If you need a refill on your cardiac medications before your next appointment, please call your pharmacy*   Testing/Procedures: Your physician has requested that you have a lower extremity arterial duplex. During this test, ultrasound is used to evaluate arterial blood flow in the legs. Allow one hour for this exam. There are no restrictions or special instructions.  Your physician has requested that you have an ankle brachial index (ABI). During this test an ultrasound and blood pressure cuff are used to evaluate the arteries that supply the arms and legs with blood. Allow thirty minutes for this exam. There are no restrictions or special instructions.  Your physician has requested that you have a carotid duplex. This test is an ultrasound of the carotid arteries in your neck. It looks at blood flow through these arteries that supply the brain with blood. Allow one hour for this exam. There are no restrictions or special instructions.    Follow-Up: At [facility], you and your health needs are our priority.  As part of our continuing mission to provide you with exceptional heart care, we have created designated Provider Care Teams.  These Care Teams include your primary Cardiologist (physician) and Advanced Practice Providers (APPs -  Physician Assistants and Nurse Practitioners) who all work together to provide you with the care you need, when you need it.  We recommend signing up for the patient portal called "MyChart".  Sign up information is provided on this After Visit Summary.  MyChart is used to connect with patients for Virtual Visits (Telemedicine).  Patients are able to view lab/test results, encounter notes, upcoming appointments, etc.  Non-urgent messages can be sent to your provider as well.   To learn more  about what you can do with MyChart, go to [email or web address].    Your next appointment:   12 month(s)  The format for your next appointment:   In Person  Provider:   You may see Quay Burow, MD or one of the following Advanced Practice Providers on your designated Care Team:    Kerin Ransom, PA-C  [place], [place]  Coletta Memos, [place]   Referral: Dennis Bast have been referred to our [facility] with Dr. Debara Pickett.   Other Instructions Please call our office 2 months in advance to schedule your annual follow-up appointment with Dr. Gwenlyn Found.

## 2019-07-26 NOTE — Progress Notes (Signed)
07/26/2019 Jessica Torres   05/16/1940  [id number]  Primary Physician Sharion Balloon, FNP Primary Cardiologist: Lorretta Harp MD [name], [place]  HPI:  Jessica Torres is a 79 y.o.  female referred to me by Dr. Paulla Dolly, her podiatrist, for evaluation of peripheral vascular disease. I last saw her in the office  11/25/2016.Marland KitchenHer cardiac risk factor profile is notable for treated hypertension and diabetes. She does have hyperlipidemia but is statin intolerant. 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 She's never had a heart attack. She denies chest pain. Has had occ shortness of breath now. She's noticed bilateral lower extremity lifestyle limiting claudication over the last several months. Recent lower extremity arterial Doppler studies performed 06/19/14 revealed ABIs in the mid 0.5 range bilaterally with occluded SFAs. She was admitted 07/06/14 for lower extremity angiography and percutaneous intervention for lifestyle limiting claudication. I performed directional atherectomy followed by drug-eluting balloon angioplasty.She has residual disease on the Lt SFA .   She had staged Bilateral lower extremity interventions on 07/06/14 (right SFA directional atherectomy, drug-eluting balloon angioplasty), 08/16/13 (directional atherectomy, stenting using Viabahn cover stent). Her ABIs improved from the 0.5 range bilaterally to normal. Her claudication has resolved as well. She's had recurrent claudication over the last several weeks to months with recent lower showed a Doppler study performed 12/29/14 revealing recurrent disease in both SFAs. I angiogram to her on 01/11/15 revealing a 95% "restenosis" in the previously treated right SFA and a totally occluded left SFA Viabahn stent. sshe underwent Hawk 1 directional atherectomy, and drug-eluting balloon angioplasty of her right SFA stenosis with an excellent angiographic result. Dopplers performed on 02/05/15 revealed an normal  right ABI with widely patent SFA and left ABI of 0.62.  Since I saw her in the office a year and a half ago she says that her right leg has had recurrent symptoms in both feet and legs are now numb with bilateral claudication. Recent Dopplers performed 10/03/16 revealed right ABI 0.73 and a left of 0.56. Her mid right SFA appears to be occluded.  I performed peripheral angiography on her 10/30/16 revealing an occluded left SFA Viabahn covered stent and occluded right SFA. I performed one directional atherectomy followed by drug-eluting balloon angioplasty of the right SFA resulting in excellent angiographic result. Her post procedure Dopplers performed on 10/27/16 revealed an increase in her right ABI from 0.73 up to 1. Her left ABI is 0.42. She is significantly symptomatically on the left side. 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  Since I saw her almost 3 years ago she is remained stable.  I did refer her to vascular surgery for consideration of left femoropopliteal bypass grafting which was deemed not a viable option because of lack of adequate venous conduit.  She does not smoke.  I performed peripheral angiography on her 10/30/2016 revealing an occluded Viabahn stent on the left with a right SFA long CTO which I atherectomized performed DCB on.  She did notice improvement in her symptoms after that.  She has not had a follow-up Doppler study however.  She denies chest pain or shortness of breath.   Current Meds  Medication Sig  . ACCU-CHEK AVIVA PLUS test strip TEST BLOOD SUGAR FOUR TIMES DAILY  . Accu-Chek Softclix Lancets lancets Use as instructed to test BG up to 3 times daily  . aspirin EC 81 MG tablet Take 81 mg by mouth daily.  . baclofen (LIORESAL) 10  MG tablet Take 1 tablet (10 mg total) by mouth 3 (three) times daily.  . BD PEN NEEDLE NANO 2ND GEN 32G X 4 MM MISC Inject 1 application into the skin in the morning and at bedtime.  . clopidogrel (PLAVIX) 75 MG tablet Take 1  tablet (75 mg total) by mouth daily.  . Dulaglutide (TRULICITY) 8.67 YP/9.5KD SOPN Inject 0.75 mg into the skin once a week.  . ergocalciferol (VITAMIN D2) 1.25 MG (50000 UT) capsule Take 1 capsule (50,000 Units total) by mouth once a week. No specific day  . ezetimibe (ZETIA) 10 MG tablet Take 1 tablet (10 mg total) by mouth daily.  . fenofibrate 160 MG tablet Take 1 tablet (160 mg total) by mouth daily.  . insulin glargine, 2 Unit Dial, (TOUJEO MAX SOLOSTAR) 300 UNIT/ML Solostar Pen Inject 39 Units into the skin daily. (Patient taking differently: Inject 40 Units into the skin daily. )  . levothyroxine (SYNTHROID) 88 MCG tablet Take 1 tablet (88 mcg total) by mouth daily before breakfast.  . losartan-hydrochlorothiazide (HYZAAR) 50-12.5 MG tablet Take 1 tablet by mouth daily.  . pantoprazole (PROTONIX) 40 MG tablet Take 1 tablet (40 mg total) by mouth daily.     Allergies  Allergen Reactions  . Morphine And Related Swelling and Other (See Comments)    Facial swelling  . Latex Rash  . Statins Other (See Comments)    Myalgias     Social History   Socioeconomic History  . Marital status: Divorced    Spouse name: Not on file  . Number of children: Not on file  . Years of education: Not on file  . Highest education level: Not on file  Occupational History  . Not on file  Tobacco Use  . Smoking status: Never Smoker  . Smokeless tobacco: Never Used  Substance and Sexual Activity  . Alcohol use: Yes    Alcohol/week: 0.0 standard drinks    Comment: 10/30/2016  "margarita q  2 wks or so"  . Drug use: No  . Sexual activity: Never  Other Topics Concern  . Not on file  Social History Narrative  . Not on file   Social Determinants of Health   Financial Resource Strain:   . Difficulty of Paying Living Expenses:   Food Insecurity:   . Worried About Charity fundraiser in the Last Year:   . Arboriculturist in the Last Year:   Transportation Needs:   . Film/video editor  (Medical):   Marland Kitchen Lack of Transportation (Non-Medical):   Physical Activity:   . Days of Exercise per Week:   . Minutes of Exercise per Session:   Stress:   . Feeling of Stress :   Social Connections:   . Frequency of Communication with Friends and Family:   . Frequency of Social Gatherings with Friends and Family:   . Attends Religious Services:   . Active Member of Clubs or Organizations:   . Attends Archivist Meetings:   Marland Kitchen Marital Status:   Intimate Partner Violence:   . Fear of Current or Ex-Partner:   . Emotionally Abused:   Marland Kitchen Physically Abused:   . Sexually Abused:      Review of Systems: General: negative for chills, fever, night sweats or weight changes.  Cardiovascular: negative for chest pain, dyspnea on exertion, edema, orthopnea, palpitations, paroxysmal nocturnal dyspnea or shortness of breath Dermatological: negative for rash Respiratory: negative for cough or wheezing Urologic: negative for hematuria Abdominal:  negative for nausea, vomiting, diarrhea, bright red blood per rectum, melena, or hematemesis Neurologic: negative for visual changes, syncope, or dizziness All other systems reviewed and are otherwise negative except as noted above.    Blood pressure 130/60, pulse 87, height 5\' 5"  (1.651 m), weight 143 lb (64.9 kg).  General appearance: alert and no distress Neck: no adenopathy, no JVD, supple, symmetrical, trachea midline, thyroid not enlarged, symmetric, no tenderness/mass/nodules and Bilateral carotid bruits Lungs: clear to auscultation bilaterally Heart: regular rate and rhythm, S1, S2 normal, no murmur, click, rub or gallop Extremities: extremities normal, atraumatic, no cyanosis or edema Pulses: 2+ and symmetric Absent pedal pulses Skin: Skin color, texture, turgor normal. No rashes or lesions Neurologic: Alert and oriented X 3, normal strength and tone. Normal symmetric reflexes. Normal coordination and gait  EKG sinus rhythm at 87 with  nonspecific ST and T wave changes.  Personally reviewed this EKG.  ASSESSMENT AND PLAN:   Peripheral arterial disease - Bilateral SFA  History of peripheral arterial disease status post bilateral SFA directional atherectomy and drug-coated balloon angioplasty.  She she had a Viabahn stent which I placed in her left SFA which ultimately occluded.  Her last angiogram which I performed was 10/30/2016 which time I atherectomized a long right SFA CTO.  She had three-vessel runoff.  I did refer her to a vascular surgeon to discuss left femoropopliteal bypass grafting but ultimately the decision was not to pursue this because of lack of adequate venous conduit.  She does complain of some numbness in her right leg.  We will repeat lower extremity arterial Doppler studies.  Hypertension associated with diabetes ([place]) History of essential hypertension blood pressure measured today 130/60.  She is on losartan and hydrochlorothiazide.  Hyperlipidemia-statin intolerant History of hyperlipidemia intolerant to statin therapy on ezetimibe and fenofibrate.  Her most recent lipid profile performed 07/13/2019 revealed total cholesterol 188, LDL 108 and HDL 45.  I am refer her to Dr. Lysbeth Penner lipid clinic for further evaluation and treatment.  Carotid stenosis, right History of mild bilateral ICA stenosis by duplex ultrasound 3 years ago.  She does have bilateral bruits on exam.  We will repeat carotid Doppler studies.      Lorretta Harp MD FACP,FACC,FAHA, [facility] 07/26/2019 8:38 AM

## 2019-07-26 NOTE — Assessment & Plan Note (Signed)
History of essential hypertension blood pressure measured today 130/60.  She is on losartan and hydrochlorothiazide.

## 2019-07-26 NOTE — Assessment & Plan Note (Signed)
History of peripheral arterial disease status post bilateral SFA directional atherectomy and drug-coated balloon angioplasty.  She she had a Viabahn stent which I placed in her left SFA which ultimately occluded.  Her last angiogram which I performed was 10/30/2016 which time I atherectomized a long right SFA CTO.  She had three-vessel runoff.  I did refer her to a vascular surgeon to discuss left femoropopliteal bypass grafting but ultimately the decision was not to pursue this because of lack of adequate venous conduit.  She does complain of some numbness in her right leg.  We will repeat lower extremity arterial Doppler studies.

## 2019-07-26 NOTE — Assessment & Plan Note (Signed)
History of hyperlipidemia intolerant to statin therapy on ezetimibe and fenofibrate.  Her most recent lipid profile performed 07/13/2019 revealed total cholesterol 188, LDL 108 and HDL 45.  I am refer her to Dr. Lysbeth Penner lipid clinic for further evaluation and treatment.

## 2019-07-26 NOTE — Assessment & Plan Note (Signed)
History of mild bilateral ICA stenosis by duplex ultrasound 3 years ago.  She does have bilateral bruits on exam.  We will repeat carotid Doppler studies.

## 2019-08-12 ENCOUNTER — Ambulatory Visit (HOSPITAL_BASED_OUTPATIENT_CLINIC_OR_DEPARTMENT_OTHER)
Admission: RE | Admit: 2019-08-12 | Discharge: 2019-08-12 | Disposition: A | Discharge status: Home / Self Care | Payer: Medicare HMO | Source: Ambulatory Visit | Attending: Cardiovascular Disease | Admitting: Cardiovascular Disease

## 2019-08-12 ENCOUNTER — Ambulatory Visit (HOSPITAL_COMMUNITY)
Admission: RE | Admit: 2019-08-12 | Discharge: 2019-08-12 | Disposition: A | Discharge status: Home / Self Care | Payer: Medicare HMO | Source: Ambulatory Visit | Attending: Cardiovascular Disease | Admitting: Cardiovascular Disease

## 2019-08-12 ENCOUNTER — Other Ambulatory Visit: Payer: Self-pay

## 2019-08-12 DIAGNOSIS — I739 Peripheral vascular disease, unspecified: Secondary | ICD-10-CM | POA: Insufficient documentation

## 2019-08-12 DIAGNOSIS — I6521 Occlusion and stenosis of right carotid artery: Secondary | ICD-10-CM | POA: Diagnosis not present

## 2019-08-30 ENCOUNTER — Ambulatory Visit: Payer: Medicare HMO | Admitting: Cardiovascular Disease

## 2019-09-01 ENCOUNTER — Encounter: Payer: Self-pay | Admitting: Family Medicine

## 2019-09-01 ENCOUNTER — Ambulatory Visit (INDEPENDENT_AMBULATORY_CARE_PROVIDER_SITE_OTHER): Payer: Medicare HMO

## 2019-09-01 ENCOUNTER — Ambulatory Visit (INDEPENDENT_AMBULATORY_CARE_PROVIDER_SITE_OTHER): Payer: Medicare HMO | Admitting: Family Medicine

## 2019-09-01 ENCOUNTER — Other Ambulatory Visit: Payer: Self-pay

## 2019-09-01 VITALS — BP 138/72 | HR 99 | Temp 97.5°F | Resp 20 | Ht 65.0 in | Wt 143.1 lb

## 2019-09-01 DIAGNOSIS — M722 Plantar fascial fibromatosis: Secondary | ICD-10-CM | POA: Diagnosis not present

## 2019-09-01 DIAGNOSIS — M79671 Pain in right foot: Secondary | ICD-10-CM

## 2019-09-01 MED ORDER — AMOXICILLIN-POT CLAVULANATE 875-125 MG PO TABS: 1.0000 | ORAL_TABLET | Freq: Two times a day (BID) | ORAL | 0 refills | Status: DC

## 2019-09-01 MED ORDER — CELECOXIB 200 MG PO CAPS: 200.0000 mg | ORAL_CAPSULE | Freq: Every day | ORAL | 5 refills | Status: DC

## 2019-09-01 MED ORDER — PSEUDOEPHEDRINE-GUAIFENESIN ER 60-600 MG PO TB12: 1.0000 | ORAL_TABLET | Freq: Two times a day (BID) | ORAL | 0 refills | Status: AC

## 2019-09-01 NOTE — Progress Notes (Signed)
Subjective:  Patient ID: Jessica Torres, female    DOB: [birth year]  Age: 79 y.o. MRN: [id number]  CC: Right heel pain and Sinusitis   HPI Jessica Torres presents for Symptoms include congestion, facial pain, nasal congestion, non productive cough, post nasal drip and sinus pressure. There is no fever, chills, or sweats. Onset of symptoms was a few days ago, gradually worsening since that time.   Heel pain at the center of the heel for 2 weeks. Increasiong severity. Ache that makes walking so poainful it has caused her back to flare up from   Depression screen [facility] 2/9 09/01/2019 11/23/2018  Decreased Interest 0 0  Down, Depressed, Hopeless 0 0  PHQ - 2 Score 0 0    History Jessica Torres has a past medical history of Arthritis, Bulging lumbar disc, Chronic lower back pain, Difficulty urinating, GERD (gastroesophageal reflux disease), Heart murmur, Hyperlipidemia, Hypertension, Hypothyroidism, PAD (peripheral artery disease) ([place]), Peripheral arterial disease ([place]), Shortness of breath dyspnea, Sleep apnea, Stroke ([place]) (~ 2010), Type 2 diabetes mellitus ([place]), Urgency of urination, and Uterine cancer ([place]).   Jessica Torres has a past surgical history that includes Balloon angioplasty, artery (Right); Carpal tunnel release (Right, 1980's); Hemorrhoid surgery; Incision and drainage breast abscess (Left); Vaginal hysterectomy; lower extremity angiogram (N/A, 07/06/2014); Cardiac catheterization (N/A, 08/17/2014); Cardiac catheterization (Left, 08/17/2014); Cardiac catheterization (N/A, 01/11/2015); Tubal ligation; Angioplasty (Right, 01/22/2015); Cardiac catheterization (Right, 01/22/2015); Breast excisional biopsy (Left); Lower extremity angiogram (10/30/2016); Breast surgery; Dilation and curettage of uterus; and Lower Extremity Angiography (N/A, 10/30/2016).   Her family history includes Arrhythmia in her mother; Diabetes in her sister; Heart attack in her mother; Heart disease in her father and mother.Jessica Torres reports that  Jessica Torres has never smoked. Jessica Torres has never used smokeless tobacco. Jessica Torres reports current alcohol use. Jessica Torres reports that Jessica Torres does not use drugs.    ROS Review of Systems  Constitutional: Negative for activity change, appetite change, chills and fever.  HENT: Positive for congestion, postnasal drip, rhinorrhea and sinus pressure. Negative for ear discharge, ear pain, hearing loss, nosebleeds, sneezing and trouble swallowing.   Respiratory: Negative for chest tightness and shortness of breath.   Cardiovascular: Negative for chest pain and palpitations.  Musculoskeletal: Positive for arthralgias and gait problem.  Skin: Negative for rash.    Objective:  BP 138/72   Pulse 99   Temp (!) 97.5 F (36.4 C) (Temporal)   Resp 20   Ht 5\' 5"  (1.651 m)   Wt 143 lb 2 oz (64.9 kg)   SpO2 100%   BMI 23.82 kg/m   BP Readings from Last 3 Encounters:  09/01/19 138/72  07/26/19 130/60  07/21/19 130/75    Wt Readings from Last 3 Encounters:  09/01/19 143 lb 2 oz (64.9 kg)  07/26/19 143 lb (64.9 kg)  07/13/19 145 lb 3.2 oz (65.9 kg)     Physical Exam Constitutional:      Appearance: Jessica Torres is well-developed. Jessica Torres is ill-appearing.  HENT:     Head: Normocephalic and atraumatic.     Right Ear: Tympanic membrane and external ear normal. No decreased hearing noted.     Left Ear: Tympanic membrane and external ear normal. No decreased hearing noted.     Nose: Mucosal edema present.     Right Sinus: No frontal sinus tenderness.     Left Sinus: No frontal sinus tenderness.     Mouth/Throat:     Pharynx: No oropharyngeal exudate or posterior oropharyngeal erythema.  Neck:  Meningeal: Brudzinski's sign absent.  Pulmonary:     Effort: No respiratory distress.     Breath sounds: Normal breath sounds.  Musculoskeletal:        General: Tenderness (At the right heel.) present.  Lymphadenopathy:     Head:     Right side of head: No preauricular adenopathy.     Left side of head: No preauricular  adenopathy.     Cervical:     Right cervical: No superficial cervical adenopathy.    Left cervical: No superficial cervical adenopathy.       Assessment & Plan:   Jessica Torres was seen today for right heel pain and sinusitis.  Diagnoses and all orders for this visit:  Pain of right heel -     DG Foot Complete Right       I am having Jessica Torres maintain her aspirin EC, ergocalciferol, ezetimibe, pantoprazole, clopidogrel, levothyroxine, fenofibrate, Accu-Chek Aviva Plus, losartan-hydrochlorothiazide, Toujeo Max SoloStar, BD Pen Needle Nano 2nd Gen, Trulicity, baclofen, and Accu-Chek Softclix Lancets.  Allergies as of 09/01/2019      Reactions   Morphine And Related Swelling, Other (See Comments)   Facial swelling   Latex Rash   Statins Other (See Comments)   Myalgias      Medication List       Accurate as of September 01, 2019  9:39 AM. If you have any questions, ask your nurse or doctor.        Accu-Chek Aviva Plus test strip Generic drug: glucose blood TEST BLOOD SUGAR FOUR TIMES DAILY   Accu-Chek Softclix Lancets lancets Use as instructed to test BG up to 3 times daily   aspirin EC 81 MG tablet Take 81 mg by mouth daily.   baclofen 10 MG tablet Commonly known as: LIORESAL Take 1 tablet (10 mg total) by mouth 3 (three) times daily.   BD Pen Needle Nano 2nd Gen 32G X 4 MM Misc Generic drug: Insulin Pen Needle Inject 1 application into the skin in the morning and at bedtime.   clopidogrel 75 MG tablet Commonly known as: PLAVIX Take 1 tablet (75 mg total) by mouth daily.   ergocalciferol 1.25 MG (50000 UT) capsule Commonly known as: VITAMIN D2 Take 1 capsule (50,000 Units total) by mouth once a week. No specific day   ezetimibe 10 MG tablet Commonly known as: ZETIA Take 1 tablet (10 mg total) by mouth daily.   fenofibrate 160 MG tablet Take 1 tablet (160 mg total) by mouth daily.   levothyroxine 88 MCG tablet Commonly known as: SYNTHROID Take 1  tablet (88 mcg total) by mouth daily before breakfast.   losartan-hydrochlorothiazide 50-12.5 MG tablet Commonly known as: HYZAAR Take 1 tablet by mouth daily.   pantoprazole 40 MG tablet Commonly known as: PROTONIX Take 1 tablet (40 mg total) by mouth daily.   Toujeo Max SoloStar 300 UNIT/ML Solostar Pen Generic drug: insulin glargine (2 Unit Dial) Inject 39 Units into the skin daily. What changed: how much to take   Trulicity 5.46 TK/3.5WS Sopn Generic drug: Dulaglutide Inject 0.75 mg into the skin once a week.       Patient was given a prescription for a surgical boot.  Jessica Torres will need to wear that until Jessica Torres sees the podiatrist on July 20.  Anytime Jessica Torres is on her feet Jessica Torres should wear this for support.   Follow-up: No follow-ups on file.  Claretta Fraise, M.D.

## 2019-09-04 ENCOUNTER — Encounter: Payer: Self-pay | Admitting: Family Medicine

## 2019-09-13 ENCOUNTER — Ambulatory Visit: Payer: Medicare HMO | Admitting: Family

## 2019-09-15 ENCOUNTER — Encounter: Payer: Self-pay | Admitting: Family

## 2019-09-22 ENCOUNTER — Ambulatory Visit (INDEPENDENT_AMBULATORY_CARE_PROVIDER_SITE_OTHER): Payer: Medicare HMO | Admitting: Family

## 2019-09-22 ENCOUNTER — Other Ambulatory Visit: Payer: Self-pay

## 2019-09-22 ENCOUNTER — Encounter: Payer: Self-pay | Admitting: Family

## 2019-09-22 VITALS — BP 119/65 | HR 86 | Temp 97.9°F | Ht 65.0 in | Wt 144.8 lb

## 2019-09-22 DIAGNOSIS — E1159 Type 2 diabetes mellitus with other circulatory complications: Secondary | ICD-10-CM

## 2019-09-22 DIAGNOSIS — Z794 Long term (current) use of insulin: Secondary | ICD-10-CM

## 2019-09-22 LAB — BMP8+EGFR
BUN/Creatinine Ratio: 13 (ref 12–28)
BUN: 21 mg/dL (ref 8–27)
CO2: 18 mmol/L — ABNORMAL LOW (ref 20–29)
Calcium: 10 mg/dL (ref 8.7–10.3)
Chloride: 100 mmol/L (ref 96–106)
Creatinine, Ser: 1.61 mg/dL — ABNORMAL HIGH (ref 0.57–1.00)
GFR calc Af Amer: 36 mL/min/{1.73_m2} — ABNORMAL LOW (ref >59)
GFR calc non Af Amer: 31 mL/min/{1.73_m2} — ABNORMAL LOW (ref >59)
Glucose: 295 mg/dL — ABNORMAL HIGH (ref 65–99)
Potassium: 5 mmol/L (ref 3.5–5.2)
Sodium: 134 mmol/L (ref 134–144)

## 2019-09-22 MED ORDER — TRULICITY 1.5 MG/0.5ML ~~LOC~~ SOAJ: 1.5000 mg | SUBCUTANEOUS | 2 refills | Status: DC

## 2019-09-22 NOTE — Progress Notes (Signed)
Subjective:    Patient ID: Jessica Torres, female    DOB: 1940-09-18, 79 y.o.   MRN: [id number]  Chief Complaint  Patient presents with   Medical Management of Chronic Issues   Pt presents to the office today to recheck DM. She was started on Trulicity once a week.  Diabetes She presents for her follow-up diabetic visit. She has type 2 diabetes mellitus. Her disease course has been stable. There are no hypoglycemic associated symptoms. Associated symptoms include foot paresthesias. Pertinent negatives for diabetes include no blurred vision. There are no hypoglycemic complications. Symptoms are stable. Diabetic complications include heart disease, peripheral neuropathy and PVD. Risk factors for coronary artery disease include dyslipidemia, diabetes mellitus, sedentary lifestyle and post-menopausal. She is following a generally unhealthy diet. Her overall blood glucose range is 140-180 mg/dl. An ACE inhibitor/angiotensin II receptor blocker is being taken.      Review of Systems  Eyes: Negative for blurred vision.  All other systems reviewed and are negative.      Objective:   Physical Exam Vitals reviewed.  Constitutional:      General: She is not in acute distress.    Appearance: She is well-developed.  HENT:     Head: Normocephalic and atraumatic.     Right Ear: Tympanic membrane normal.     Left Ear: Tympanic membrane normal.  Eyes:     Pupils: Pupils are equal, round, and reactive to light.  Neck:     Thyroid: No thyromegaly.  Cardiovascular:     Rate and Rhythm: Normal rate and regular rhythm.     Heart sounds: Normal heart sounds. No murmur heard.   Pulmonary:     Effort: Pulmonary effort is normal. No respiratory distress.     Breath sounds: Normal breath sounds. No wheezing.  Abdominal:     General: Bowel sounds are normal. There is no distension.     Palpations: Abdomen is soft.     Tenderness: There is no abdominal tenderness.  Musculoskeletal:         General: No tenderness. Normal range of motion.     Cervical back: Normal range of motion and neck supple.  Skin:    General: Skin is warm and dry.  Neurological:     Mental Status: She is alert and oriented to person, place, and time.     Cranial Nerves: No cranial nerve deficit.     Deep Tendon Reflexes: Reflexes are normal and symmetric.  Psychiatric:        Behavior: Behavior normal.        Thought Content: Thought content normal.        Judgment: Judgment normal.       BP 119/65    Pulse 86    Temp 97.9 F (36.6 C) (Temporal)    Ht 5' 5"  (1.651 m)    Wt 144 lb 12.8 oz (65.7 kg)    BMI 24.10 kg/m      Assessment & Plan:  Jessica Torres comes in today with chief complaint of Medical Management of Chronic Issues   Diagnosis and orders addressed:  1. Type 2 diabetes mellitus with other circulatory complication, with long-term current use of insulin (HCC) Will increase Trulicity to  1.5 mg from 0.75 mg  Strict low carb diet RTO in 1 month  - Dulaglutide (TRULICITY) 1.5 EB/3.4DH SOPN; Inject 0.5 mLs (1.5 mg total) into the skin once a week.  Dispense: 12 pen; Refill: 2 - BMP8+EGFR    Deja Kaigler  Lenna Gilford, [place]

## 2019-09-22 NOTE — Patient Instructions (Signed)

## 2019-09-26 ENCOUNTER — Other Ambulatory Visit: Payer: Self-pay | Admitting: Family

## 2019-09-26 ENCOUNTER — Other Ambulatory Visit: Payer: Self-pay | Admitting: Physician Assistant

## 2019-09-26 DIAGNOSIS — Z1231 Encounter for screening mammogram for malignant neoplasm of breast: Secondary | ICD-10-CM

## 2019-09-27 DIAGNOSIS — M722 Plantar fascial fibromatosis: Secondary | ICD-10-CM | POA: Diagnosis not present

## 2019-09-27 DIAGNOSIS — M79671 Pain in right foot: Secondary | ICD-10-CM | POA: Diagnosis not present

## 2019-10-11 DIAGNOSIS — M79671 Pain in right foot: Secondary | ICD-10-CM | POA: Diagnosis not present

## 2019-10-11 DIAGNOSIS — M722 Plantar fascial fibromatosis: Secondary | ICD-10-CM | POA: Diagnosis not present

## 2019-10-25 ENCOUNTER — Other Ambulatory Visit: Payer: Self-pay

## 2019-10-25 ENCOUNTER — Ambulatory Visit (INDEPENDENT_AMBULATORY_CARE_PROVIDER_SITE_OTHER): Payer: Medicare HMO | Admitting: Family

## 2019-10-25 ENCOUNTER — Encounter: Payer: Self-pay | Admitting: Family

## 2019-10-25 VITALS — BP 132/74 | HR 93 | Temp 97.4°F | Ht 65.0 in | Wt 142.0 lb

## 2019-10-25 DIAGNOSIS — Z9862 Peripheral vascular angioplasty status: Secondary | ICD-10-CM | POA: Diagnosis not present

## 2019-10-25 DIAGNOSIS — I10 Essential (primary) hypertension: Secondary | ICD-10-CM | POA: Diagnosis not present

## 2019-10-25 DIAGNOSIS — K219 Gastro-esophageal reflux disease without esophagitis: Secondary | ICD-10-CM | POA: Diagnosis not present

## 2019-10-25 DIAGNOSIS — I2583 Coronary atherosclerosis due to lipid rich plaque: Secondary | ICD-10-CM | POA: Diagnosis not present

## 2019-10-25 DIAGNOSIS — Z794 Long term (current) use of insulin: Secondary | ICD-10-CM | POA: Diagnosis not present

## 2019-10-25 DIAGNOSIS — R8290 Unspecified abnormal findings in urine: Secondary | ICD-10-CM

## 2019-10-25 DIAGNOSIS — E039 Hypothyroidism, unspecified: Secondary | ICD-10-CM | POA: Diagnosis not present

## 2019-10-25 DIAGNOSIS — E1159 Type 2 diabetes mellitus with other circulatory complications: Secondary | ICD-10-CM | POA: Diagnosis not present

## 2019-10-25 DIAGNOSIS — I2510 Atherosclerotic heart disease of native coronary artery without angina pectoris: Secondary | ICD-10-CM | POA: Diagnosis not present

## 2019-10-25 DIAGNOSIS — I152 Hypertension secondary to endocrine disorders: Secondary | ICD-10-CM

## 2019-10-25 DIAGNOSIS — I739 Peripheral vascular disease, unspecified: Secondary | ICD-10-CM | POA: Diagnosis not present

## 2019-10-25 DIAGNOSIS — E7800 Pure hypercholesterolemia, unspecified: Secondary | ICD-10-CM | POA: Diagnosis not present

## 2019-10-25 LAB — URINALYSIS, COMPLETE
Bilirubin, UA: NEGATIVE
Glucose, UA: NEGATIVE
Ketones, UA: NEGATIVE
Leukocytes,UA: NEGATIVE
Nitrite, UA: NEGATIVE
Protein,UA: NEGATIVE
RBC, UA: NEGATIVE
Specific Gravity, UA: 1.025 (ref 1.005–1.030)
Urobilinogen, Ur: 0.2 mg/dL (ref 0.2–1.0)
pH, UA: 5 (ref 5.0–7.5)

## 2019-10-25 LAB — BAYER DCA HB A1C WAIVED: HB A1C (BAYER DCA - WAIVED): 9 % — ABNORMAL HIGH (ref <7.0)

## 2019-10-25 LAB — MICROSCOPIC EXAMINATION
Bacteria, UA: NONE SEEN
Epithelial Cells (non renal): NONE SEEN /hpf (ref 0–10)
RBC, Urine: NONE SEEN /hpf (ref 0–2)
WBC, UA: NONE SEEN /hpf (ref 0–5)

## 2019-10-25 MED ORDER — ONDANSETRON HCL 4 MG PO TABS: 4.0000 mg | ORAL_TABLET | Freq: Three times a day (TID) | ORAL | 2 refills | Status: DC | PRN

## 2019-10-25 NOTE — Patient Instructions (Signed)

## 2019-10-25 NOTE — Progress Notes (Signed)
Subjective:    Patient ID: Jessica Torres, female    DOB: 01/26/1941, 79 y.o.   MRN: [id number]  Chief Complaint  Patient presents with  . Diabetes    1 mth follow up, discuss trulicity she is staying nauseated   . Back Pain    Patient thinks its a UTI urine also has odor   Pt presents to the office today for chronic follow up. She is followed by Cardiologists annually for CAD.  Diabetes She presents for her follow-up diabetic visit. She has type 2 diabetes mellitus. Her disease course has been stable. There are no hypoglycemic associated symptoms. Associated symptoms include foot paresthesias. Pertinent negatives for diabetes include no blurred vision and no fatigue. Symptoms are stable. 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 Pertinent negatives for diabetic complications include no CVA. Risk factors for coronary artery disease include dyslipidemia, diabetes mellitus, hypertension and sedentary lifestyle. Her overall blood glucose range is 110-130 mg/dl. An ACE inhibitor/angiotensin II receptor blocker is being taken. Eye exam is not current.  Hypertension This is a chronic problem. The current episode started more than 1 year ago. The problem has been waxing and waning since onset. The problem is uncontrolled. Associated symptoms include malaise/fatigue. Pertinent negatives include no blurred vision, peripheral edema or shortness of breath. Risk factors for coronary artery disease include dyslipidemia, diabetes mellitus and sedentary lifestyle. The current treatment provides moderate improvement. There is no history of CVA. Identifiable causes of hypertension include a thyroid problem.  Thyroid Problem Presents for follow-up visit. Symptoms include constipation. Patient reports no depressed mood, diarrhea or fatigue. The symptoms have been stable. Her past medical history is significant for hyperlipidemia.  Urinary Frequency  This is a new problem. The current  episode started in the past 7 days. The problem occurs every urination. The problem has been gradually worsening. The pain is at a severity of 0/10. Associated symptoms include flank pain, frequency and nausea. Pertinent negatives include no discharge or hematuria. Associated symptoms comments: Foul odor . She has tried increased fluids for the symptoms. The treatment provided no relief.  Gastroesophageal Reflux She complains of belching, heartburn and nausea. This is a chronic problem. The current episode started more than 1 year ago. The problem occurs occasionally. Pertinent negatives include no fatigue. She has tried a PPI for the symptoms. The treatment provided moderate relief.  Hyperlipidemia This is a chronic problem. The current episode started more than 1 year ago. The problem is uncontrolled. Pertinent negatives include no shortness of breath. Current antihyperlipidemic treatment includes ezetimibe and fibric acid derivatives. The current treatment provides moderate improvement of lipids. Risk factors for coronary artery disease include dyslipidemia, diabetes mellitus, hypertension, a sedentary lifestyle and post-menopausal.      Review of Systems  Constitutional: Positive for malaise/fatigue. Negative for fatigue.  Eyes: Negative for blurred vision.  Respiratory: Negative for shortness of breath.   Gastrointestinal: Positive for constipation, heartburn and nausea. Negative for diarrhea.  Genitourinary: Positive for flank pain and frequency. Negative for hematuria.  All other systems reviewed and are negative.      Objective:   Physical Exam Vitals reviewed.  Constitutional:      General: She is not in acute distress.    Appearance: She is well-developed.  HENT:     Head: Normocephalic and atraumatic.     Right Ear: Tympanic membrane normal.     Left Ear: Tympanic membrane normal.  Eyes:     Pupils: Pupils are equal, round, and reactive  to light.  Neck:     Thyroid: No  thyromegaly.  Cardiovascular:     Rate and Rhythm: Normal rate and regular rhythm.     Heart sounds: Normal heart sounds. No murmur heard.   Pulmonary:     Effort: Pulmonary effort is normal. No respiratory distress.     Breath sounds: Normal breath sounds. No wheezing.  Abdominal:     General: Bowel sounds are normal. There is no distension.     Palpations: Abdomen is soft.     Tenderness: There is no abdominal tenderness.  Musculoskeletal:        General: No tenderness. Normal range of motion.     Cervical back: Normal range of motion and neck supple.  Skin:    General: Skin is warm and dry.  Neurological:     Mental Status: She is alert and oriented to person, place, and time.     Cranial Nerves: No cranial nerve deficit.     Deep Tendon Reflexes: Reflexes are normal and symmetric.  Psychiatric:        Behavior: Behavior normal.        Thought Content: Thought content normal.        Judgment: Judgment normal.      BP (!) 151/77   Pulse 92   Temp (!) 97.4 F (36.3 C) (Temporal)   Ht _0  (1.651 m)   Wt 142 lb (64.4 kg)   SpO2 100%   BMI 23.63 kg/m       Assessment & Plan:  Jessica Torres comes in today with chief complaint of Diabetes (1 mth follow up, discuss trulicity she is staying nauseated ) and Back Pain (Patient thinks its a UTI urine also has odor)   Diagnosis and orders addressed:  1. Type 2 diabetes mellitus with other circulatory complication, with long-term current use of insulin (HCC) - CMP14+EGFR - Bayer DCA Hb A1c Waived  2. Coronary artery disease due to lipid rich plaque - CMP14+EGFR  3. Hypertension associated with diabetes ([place]) - CMP14+EGFR  4. Gastroesophageal reflux disease without esophagitis - CMP14+EGFR  5. Hypothyroidism, unspecified type - CMP14+EGFR  6. Claudication in peripheral vascular disease ([place]) - CMP14+EGFR  7. Pure hypercholesterolemia  - CMP14+EGFR  8. S/P -Billat SFA PTA with restenosis - CMP14+EGFR  9.  Foul smelling urine - Urinalysis, Complete - CMP14+EGFR - Bayer DCA Hb A1c Waived   Labs pending Health Maintenance reviewed Diet and exercise encouraged  Follow up plan: 3 months   Jessica Dun, FNP

## 2019-10-26 LAB — CMP14+EGFR
ALT: 15 IU/L (ref 0–32)
AST: 22 IU/L (ref 0–40)
Albumin/Globulin Ratio: 1.6 (ref 1.2–2.2)
Albumin: 4.5 g/dL (ref 3.7–4.7)
Alkaline Phosphatase: 66 IU/L (ref 48–121)
BUN/Creatinine Ratio: 13 (ref 12–28)
BUN: 18 mg/dL (ref 8–27)
Bilirubin Total: 0.4 mg/dL (ref 0.0–1.2)
CO2: 23 mmol/L (ref 20–29)
Calcium: 10.7 mg/dL — ABNORMAL HIGH (ref 8.7–10.3)
Chloride: 100 mmol/L (ref 96–106)
Creatinine, Ser: 1.36 mg/dL — ABNORMAL HIGH (ref 0.57–1.00)
GFR calc Af Amer: 44 mL/min/{1.73_m2} — ABNORMAL LOW (ref >59)
GFR calc non Af Amer: 38 mL/min/{1.73_m2} — ABNORMAL LOW (ref >59)
Globulin, Total: 2.8 g/dL (ref 1.5–4.5)
Glucose: 155 mg/dL — ABNORMAL HIGH (ref 65–99)
Potassium: 4.5 mmol/L (ref 3.5–5.2)
Sodium: 137 mmol/L (ref 134–144)
Total Protein: 7.3 g/dL (ref 6.0–8.5)

## 2019-10-27 ENCOUNTER — Other Ambulatory Visit: Payer: Self-pay | Admitting: Family

## 2019-10-27 DIAGNOSIS — E1159 Type 2 diabetes mellitus with other circulatory complications: Secondary | ICD-10-CM

## 2019-10-27 DIAGNOSIS — Z794 Long term (current) use of insulin: Secondary | ICD-10-CM

## 2019-10-27 MED ORDER — TOUJEO MAX SOLOSTAR 300 UNIT/ML ~~LOC~~ SOPN: 44.0000 [IU] | PEN_INJECTOR | Freq: Every day | SUBCUTANEOUS | 2 refills | Status: DC

## 2019-10-28 ENCOUNTER — Other Ambulatory Visit: Payer: Self-pay | Admitting: Family

## 2019-10-28 ENCOUNTER — Other Ambulatory Visit: Payer: Self-pay

## 2019-10-28 ENCOUNTER — Ambulatory Visit (INDEPENDENT_AMBULATORY_CARE_PROVIDER_SITE_OTHER): Payer: Medicare HMO | Admitting: Pharmacist

## 2019-10-28 DIAGNOSIS — G720 Drug-induced myopathy: Secondary | ICD-10-CM | POA: Diagnosis not present

## 2019-10-28 DIAGNOSIS — R399 Unspecified symptoms and signs involving the genitourinary system: Secondary | ICD-10-CM

## 2019-10-28 DIAGNOSIS — E119 Type 2 diabetes mellitus without complications: Secondary | ICD-10-CM | POA: Diagnosis not present

## 2019-10-28 LAB — URINALYSIS, COMPLETE
Bilirubin, UA: NEGATIVE
Ketones, UA: NEGATIVE
Leukocytes,UA: NEGATIVE
Nitrite, UA: NEGATIVE
Protein,UA: NEGATIVE
RBC, UA: NEGATIVE
Specific Gravity, UA: 1.025 (ref 1.005–1.030)
Urobilinogen, Ur: 0.2 mg/dL (ref 0.2–1.0)
pH, UA: 5 (ref 5.0–7.5)

## 2019-10-28 LAB — MICROSCOPIC EXAMINATION
Bacteria, UA: NONE SEEN
Epithelial Cells (non renal): NONE SEEN /hpf (ref 0–10)
RBC, Urine: NONE SEEN /hpf (ref 0–2)
WBC, UA: NONE SEEN /hpf (ref 0–5)

## 2019-10-28 MED ORDER — CEPHALEXIN 500 MG PO CAPS: 500.0000 mg | ORAL_CAPSULE | Freq: Two times a day (BID) | ORAL | 0 refills | Status: DC

## 2019-10-28 NOTE — Progress Notes (Signed)
    10/28/2019 Name: Jessica Torres MRN: [id number] DOB: 29-Jun-1940   S:  55 yoF [resents for diabetes evaluation, education, and management Patient was referred and last seen by Primary Care Provider on 10/25/19.  Insurance coverage/medication affordability: human[employer] medicare  Patient reports adherence with medications. . Current diabetes medications include: trulicity, toujeo  . Metformin discontinued due to renal function . Current hypertension medications include: losartan/hctz Goal 130/80 . Current hyperlipidemia medications include: zetia, fenofibrate . STATIN ALLERGY/INTOLERANCE DOCUMENTED IN EMR --> yes, ICD10: G72     Patient denies hypoglycemic events.   Patient reported dietary habits: Eats 2-3 meals/day The patient is asked to make an attempt to improve diet and exercise patterns to aid in medical management of this problem. Discussed meal planning options and Plate method for healthy eating . Avoid sugary drinks and desserts . Incorporate balanced protein, non starchy veggies, 1 serving of carbohydrate with each meal . Increase water intake . Increase physical activity as able   Breakfast:biscuits, bacon, eggs  Lunch/dinner:sandwiches, casseroles, southern food  Drinks:encouraged water, decrease sugary drinks  Patient-reported exercise habits: n/a  Patient denies nocturia (nighttime urination).  O:  Lab Results  Component Value Date   HGBA1C 9.0 (H) 10/25/2019    Lipid Panel     Component Value Date/Time   CHOL 186 07/13/2019 1037   TRIG 188 (H) 07/13/2019 1037   HDL 45 07/13/2019 1037   CHOLHDL 4.1 07/13/2019 1037   LDLCALC 108 (H) 07/13/2019 1037     Home fasting blood sugars: 100-130  2 hour post-meal/random blood sugars: n/a.   A/P:  Diabetes T2DM currently uncontrolled, but patient's BGs are improving with the addition of trulicity. Patient is able to verbalize appropriate hypoglycemia management plan. Patient is adherent with  medication GOAL: reduce pill burden/injections to improve compliance  -Continue basal insulin Toujeo(insulin glargine)continue 40 units qHS.   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  Total time in face to face counseling 30 minutes.   Follow up PCP Clinic Visit ON 01/25/20   Regina Eck, PharmD, BCPS Clinical Pharmacist, [place]  II Phone [phone]

## 2019-10-30 LAB — URINE CULTURE

## 2019-10-30 LAB — SPECIMEN STATUS REPORT

## 2019-11-01 DIAGNOSIS — M79671 Pain in right foot: Secondary | ICD-10-CM | POA: Diagnosis not present

## 2019-11-01 DIAGNOSIS — M722 Plantar fascial fibromatosis: Secondary | ICD-10-CM | POA: Diagnosis not present

## 2019-11-07 ENCOUNTER — Other Ambulatory Visit: Payer: Self-pay

## 2019-11-07 ENCOUNTER — Ambulatory Visit
Admission: RE | Admit: 2019-11-07 | Discharge: 2019-11-07 | Disposition: A | Discharge status: Home / Self Care | Payer: Medicare HMO | Source: Ambulatory Visit | Attending: *Deleted | Admitting: *Deleted

## 2019-11-07 DIAGNOSIS — Z1231 Encounter for screening mammogram for malignant neoplasm of breast: Secondary | ICD-10-CM

## 2019-11-09 ENCOUNTER — Encounter: Payer: Self-pay | Admitting: *Deleted

## 2019-11-11 ENCOUNTER — Encounter: Payer: Self-pay | Admitting: Pharmacist

## 2019-11-11 DIAGNOSIS — G720 Drug-induced myopathy: Secondary | ICD-10-CM | POA: Insufficient documentation

## 2019-11-11 MED ORDER — ACCU-CHEK SOFTCLIX LANCETS MISC
12 refills | Status: DC
Start: 2019-11-11 — End: 2020-05-24

## 2019-12-16 ENCOUNTER — Ambulatory Visit: Payer: Medicare HMO | Admitting: Cardiovascular Disease

## 2020-01-19 ENCOUNTER — Encounter: Payer: Self-pay | Admitting: *Deleted

## 2020-01-25 ENCOUNTER — Encounter: Payer: Self-pay | Admitting: Family

## 2020-01-25 ENCOUNTER — Other Ambulatory Visit: Payer: Self-pay

## 2020-01-25 ENCOUNTER — Ambulatory Visit (INDEPENDENT_AMBULATORY_CARE_PROVIDER_SITE_OTHER): Payer: Medicare HMO | Admitting: Family

## 2020-01-25 VITALS — BP 137/71 | HR 73 | Temp 96.8°F | Wt 146.6 lb

## 2020-01-25 DIAGNOSIS — I2583 Coronary atherosclerosis due to lipid rich plaque: Secondary | ICD-10-CM | POA: Diagnosis not present

## 2020-01-25 DIAGNOSIS — Z1159 Encounter for screening for other viral diseases: Secondary | ICD-10-CM | POA: Diagnosis not present

## 2020-01-25 DIAGNOSIS — E039 Hypothyroidism, unspecified: Secondary | ICD-10-CM | POA: Diagnosis not present

## 2020-01-25 DIAGNOSIS — E7800 Pure hypercholesterolemia, unspecified: Secondary | ICD-10-CM | POA: Diagnosis not present

## 2020-01-25 DIAGNOSIS — I2510 Atherosclerotic heart disease of native coronary artery without angina pectoris: Secondary | ICD-10-CM | POA: Diagnosis not present

## 2020-01-25 DIAGNOSIS — E119 Type 2 diabetes mellitus without complications: Secondary | ICD-10-CM

## 2020-01-25 DIAGNOSIS — I152 Hypertension secondary to endocrine disorders: Secondary | ICD-10-CM

## 2020-01-25 DIAGNOSIS — I739 Peripheral vascular disease, unspecified: Secondary | ICD-10-CM | POA: Diagnosis not present

## 2020-01-25 DIAGNOSIS — K219 Gastro-esophageal reflux disease without esophagitis: Secondary | ICD-10-CM

## 2020-01-25 DIAGNOSIS — E1159 Type 2 diabetes mellitus with other circulatory complications: Secondary | ICD-10-CM | POA: Diagnosis not present

## 2020-01-25 DIAGNOSIS — Z794 Long term (current) use of insulin: Secondary | ICD-10-CM

## 2020-01-25 LAB — BAYER DCA HB A1C WAIVED: HB A1C (BAYER DCA - WAIVED): 9.2 % — ABNORMAL HIGH (ref <7.0)

## 2020-01-25 MED ORDER — EMPAGLIFLOZIN 10 MG PO TABS: 10.0000 mg | ORAL_TABLET | Freq: Every day | ORAL | 3 refills | Status: DC

## 2020-01-25 MED ORDER — BD PEN NEEDLE NANO 2ND GEN 32G X 4 MM MISC: 1.0000 "application " | Freq: Two times a day (BID) | 6 refills | Status: DC

## 2020-01-25 MED ORDER — TOUJEO MAX SOLOSTAR 300 UNIT/ML ~~LOC~~ SOPN: 47.0000 [IU] | PEN_INJECTOR | Freq: Every day | SUBCUTANEOUS | 2 refills | Status: DC

## 2020-01-25 NOTE — Patient Instructions (Signed)

## 2020-01-25 NOTE — Progress Notes (Signed)
Subjective:    Patient ID: Jessica Torres, female    DOB: [birth year], 79 y.o.   MRN: [id number]  Chief Complaint  Patient presents with  . Diabetes   Pt presents to the office today for chronic follow up. She is followed by Vascular for PAD every  6 months.  Diabetes She presents for her follow-up diabetic visit. She has type 2 diabetes mellitus. Her disease course has been stable. There are no hypoglycemic associated symptoms. Pertinent negatives for hypoglycemia include no nervousness/anxiousness. Associated symptoms include foot paresthesias. Pertinent negatives for diabetes include no blurred vision and no fatigue. Symptoms are stable. Diabetic complications include heart disease. Pertinent negatives for diabetic complications include no nephropathy. Risk factors for coronary artery disease include dyslipidemia, diabetes mellitus, sedentary lifestyle and post-menopausal. She is following a generally unhealthy diet. Her overall blood glucose range is 90-110 mg/dl. An ACE inhibitor/angiotensin II receptor blocker is being taken. Eye exam is current.  Thyroid Problem Presents for follow-up visit. Patient reports no anxiety, constipation or fatigue. The symptoms have been stable. Her past medical history is significant for hyperlipidemia.  Hyperlipidemia This is a chronic problem. The current episode started more than 1 year ago. The problem is uncontrolled. Recent lipid tests were reviewed and are high. Exacerbating diseases include hypothyroidism. Current antihyperlipidemic treatment includes ezetimibe. The current treatment provides mild improvement of lipids. Risk factors for coronary artery disease include dyslipidemia, diabetes mellitus, hypertension, a sedentary lifestyle and post-menopausal.  Gastroesophageal Reflux She complains of belching and heartburn. This is a chronic problem. The current episode started more than 1 year ago. The problem occurs occasionally. The problem has been  waxing and waning. Pertinent negatives include no fatigue. She has tried a PPI for the symptoms. The treatment provided moderate relief.      Review of Systems  Constitutional: Negative for fatigue.  Eyes: Negative for blurred vision.  Gastrointestinal: Positive for heartburn. Negative for constipation.  Psychiatric/Behavioral: The patient is not nervous/anxious.   All other systems reviewed and are negative.      Objective:   Physical Exam Vitals reviewed.  Constitutional:      General: She is not in acute distress.    Appearance: She is well-developed.  HENT:     Head: Normocephalic and atraumatic.     Right Ear: Tympanic membrane normal.     Left Ear: Tympanic membrane normal.  Eyes:     Pupils: Pupils are equal, round, and reactive to light.  Neck:     Thyroid: No thyromegaly.  Cardiovascular:     Rate and Rhythm: Normal rate and regular rhythm.     Heart sounds: Normal heart sounds. No murmur heard.   Pulmonary:     Effort: Pulmonary effort is normal. No respiratory distress.     Breath sounds: Normal breath sounds. No wheezing.  Abdominal:     General: Bowel sounds are normal. There is no distension.     Palpations: Abdomen is soft.     Tenderness: There is no abdominal tenderness.  Musculoskeletal:        General: No tenderness. Normal range of motion.     Cervical back: Normal range of motion and neck supple.  Skin:    General: Skin is warm and dry.  Neurological:     Mental Status: She is alert and oriented to person, place, and time.     Cranial Nerves: No cranial nerve deficit.     Deep Tendon Reflexes: Reflexes are normal and symmetric.  Psychiatric:  Behavior: Behavior normal.        Thought Content: Thought content normal.        Judgment: Judgment normal.      BP 137/71   Pulse 73   Temp (!) 96.8 F (36 C) (Temporal)   Wt 146 lb 9.6 oz (66.5 kg)   SpO2 98%   BMI 24.40 kg/m      Assessment & Plan:  [name] comes in today  with chief complaint of Diabetes   Diagnosis and orders addressed:  1. Type 2 diabetes mellitus without complication, without long-term current use of insulin (HCC) Will start Jardiance 10 mg and increase Toujeo to 48 units from 44 units  - Bayer DCA Hb A1c Waived - CMP14+EGFR - CBC with Differential/Platelet - empagliflozin (JARDIANCE) 10 MG TABS tablet; Take 1 tablet (10 mg total) by mouth daily before breakfast.  Dispense: 90 tablet; Refill: 3  2. Hypertension associated with diabetes ([place]) - CMP14+EGFR - CBC with Differential/Platelet  3. Coronary artery disease due to lipid rich plaque - CMP14+EGFR - CBC with Differential/Platelet  4. Gastroesophageal reflux disease without esophagitis - CMP14+EGFR - CBC with Differential/Platelet  5. Hypothyroidism, unspecified type - CMP14+EGFR - CBC with Differential/Platelet - TSH  6. Pure hypercholesterolemia - CMP14+EGFR - CBC with Differential/Platelet  7. Claudication in peripheral vascular disease ([place]) - CMP14+EGFR - CBC with Differential/Platelet  8. Need for hepatitis C screening test - CMP14+EGFR - CBC with Differential/Platelet - Hepatitis C antibody  9. Type 2 diabetes mellitus with other circulatory complication, with long-term current use of insulin (HCC) - insulin glargine, 2 Unit Dial, (TOUJEO MAX SOLOSTAR) 300 UNIT/ML Solostar Pen; Inject 48 Units into the skin daily.  Dispense: 12 mL; Refill: 2 - BD PEN NEEDLE NANO 2ND GEN 32G X 4 MM MISC; Inject 1 application into the skin in the morning and at bedtime.  Dispense: 100 each; Refill: 6   Labs pending Health Maintenance reviewed Diet and exercise encouraged  Follow up plan: 2 months and needs appt with Pilar Plate, FNP

## 2020-01-26 LAB — TSH: TSH: 1.21 u[IU]/mL (ref 0.450–4.500)

## 2020-01-26 LAB — CMP14+EGFR
ALT: 21 IU/L (ref 0–32)
AST: 30 IU/L (ref 0–40)
Albumin/Globulin Ratio: 1.8 (ref 1.2–2.2)
Albumin: 4.1 g/dL (ref 3.7–4.7)
Alkaline Phosphatase: 65 IU/L (ref 44–121)
BUN/Creatinine Ratio: 10 — ABNORMAL LOW (ref 12–28)
BUN: 14 mg/dL (ref 8–27)
Bilirubin Total: 0.4 mg/dL (ref 0.0–1.2)
CO2: 20 mmol/L (ref 20–29)
Calcium: 9.8 mg/dL (ref 8.7–10.3)
Chloride: 100 mmol/L (ref 96–106)
Creatinine, Ser: 1.41 mg/dL — ABNORMAL HIGH (ref 0.57–1.00)
GFR calc Af Amer: 41 mL/min/{1.73_m2} — ABNORMAL LOW (ref >59)
GFR calc non Af Amer: 36 mL/min/{1.73_m2} — ABNORMAL LOW (ref >59)
Globulin, Total: 2.3 g/dL (ref 1.5–4.5)
Glucose: 229 mg/dL — ABNORMAL HIGH (ref 65–99)
Potassium: 5.2 mmol/L (ref 3.5–5.2)
Sodium: 134 mmol/L (ref 134–144)
Total Protein: 6.4 g/dL (ref 6.0–8.5)

## 2020-01-26 LAB — CBC WITH DIFFERENTIAL/PLATELET
Basophils Absolute: 0.1 10*3/uL (ref 0.0–0.2)
Basos: 1 %
EOS (ABSOLUTE): 0.3 10*3/uL (ref 0.0–0.4)
Eos: 4 %
Hematocrit: 38.5 % (ref 34.0–46.6)
Hemoglobin: 12.8 g/dL (ref 11.1–15.9)
Immature Grans (Abs): 0 10*3/uL (ref 0.0–0.1)
Immature Granulocytes: 1 %
Lymphocytes Absolute: 1.9 10*3/uL (ref 0.7–3.1)
Lymphs: 28 %
MCH: 29.9 pg (ref 26.6–33.0)
MCHC: 33.2 g/dL (ref 31.5–35.7)
MCV: 90 fL (ref 79–97)
Monocytes Absolute: 0.8 10*3/uL (ref 0.1–0.9)
Monocytes: 12 %
Neutrophils Absolute: 3.6 10*3/uL (ref 1.4–7.0)
Neutrophils: 54 %
Platelets: 337 10*3/uL (ref 150–450)
RBC: 4.28 x10E6/uL (ref 3.77–5.28)
RDW: 12 % (ref 11.7–15.4)
WBC: 6.6 10*3/uL (ref 3.4–10.8)

## 2020-01-27 LAB — SPECIMEN STATUS REPORT

## 2020-01-27 LAB — HEPATITIS C ANTIBODY: Hep C Virus Ab: <0.1 s/co ratio (ref 0.0–0.9)

## 2020-01-28 DIAGNOSIS — H2513 Age-related nuclear cataract, bilateral: Secondary | ICD-10-CM | POA: Diagnosis not present

## 2020-01-28 DIAGNOSIS — H40033 Anatomical narrow angle, bilateral: Secondary | ICD-10-CM | POA: Diagnosis not present

## 2020-01-30 ENCOUNTER — Other Ambulatory Visit: Payer: Self-pay

## 2020-01-30 ENCOUNTER — Ambulatory Visit (INDEPENDENT_AMBULATORY_CARE_PROVIDER_SITE_OTHER): Payer: Medicare HMO | Admitting: Pharmacist

## 2020-01-30 VITALS — BP 130/72 | HR 70

## 2020-01-30 DIAGNOSIS — G720 Drug-induced myopathy: Secondary | ICD-10-CM | POA: Diagnosis not present

## 2020-01-30 DIAGNOSIS — E119 Type 2 diabetes mellitus without complications: Secondary | ICD-10-CM

## 2020-01-30 MED ORDER — TRIAMCINOLONE ACETONIDE 0.1 % MT PSTE: 1.0000 "application " | PASTE | Freq: Two times a day (BID) | OROMUCOSAL | 12 refills | Status: DC

## 2020-01-30 MED ORDER — EMPAGLIFLOZIN 25 MG PO TABS: 25.0000 mg | ORAL_TABLET | Freq: Every day | ORAL | 3 refills | Status: DC

## 2020-01-30 NOTE — Progress Notes (Signed)
    01/30/2020 Name: Jessica Torres MRN: [id number] DOB: 11-Apr-1940   S:  13 yoF [resents for diabetes evaluation, education, and management Patient was referred and last seen by Primary Care Provider on 01/25/20.  Insurance coverage/medication affordability: human[employer] medicare  Patient reports adherence with medications.  Current diabetes medications include: toujeo   Metformin discontinued due to renal function  Current hypertension medications include: losartan/hctz Goal 130/80  Current hyperlipidemia medications include: zetia, fenofibrate  STATIN ALLERGY/INTOLERANCE DOCUMENTED IN EMR --> yes, ICD10: G72   Patient denies hypoglycemic events.   Patient reported dietary habits: Eats 2-3 meals/day The patient is asked to make an attempt to improve diet and exercise patterns to aid in medical management of this problem. Discussed meal planning options and Plate method for healthy eating  Avoid sugary drinks and desserts  Incorporate balanced protein, non starchy veggies, 1 serving of carbohydrate with each meal  Increase water intake  Increase physical activity as able   Breakfast:biscuits, bacon, eggs  Lunch/dinner:sandwiches, casseroles, southern food  Drinks:encouraged water, decrease sugary drinks  Patient-reported exercise habits: n/a  Patient denies nocturia (nighttime urination).   O:  Lab Results  Component Value Date   HGBA1C 9.2 (H) 01/25/2020    Vitals:   02/01/20 1529  BP: 130/72  Pulse: 70   Lipid Panel     Component Value Date/Time   CHOL 186 07/13/2019 1037   TRIG 188 (H) 07/13/2019 1037   HDL 45 07/13/2019 1037   CHOLHDL 4.1 07/13/2019 1037   LDLCALC 108 (H) 07/13/2019 1037    Home fasting blood sugars: 200s  2 hour post-meal/random blood sugars: 200-300.  A/P:  Diabetes T2DM currently uncontrolled.  Patient was unable to take trulicity due to GI distress. Control is suboptimal due to diet   BASAL INSULIN (TOUJEO)  INCREASED TO 48 UNITS   Restarted humalog 10-20 units with meals  SENT RX JARDIANCE 25MG  DAILY TO [facility]  SAMPLES OF JARDIANCE 10MG  DAILY #14 GIVEN TO START [id number], EXP 6/23  DISCONTINUE METFORMIN  DISCONTINUE TRULICITY  -Extensively discussed pathophysiology of diabetes, recommended lifestyle interventions, dietary effects on blood sugar control  -Counseled on s/sx of and management of hypoglycemia  -Next A1C anticipated 03/2020.      Written patient instructions provided.  Total time in face to face counseling 30 minutes.   Follow up Pharmacist Clinic Visit ON 02/27/21.   Regina Eck, PharmD, BCPS Clinical Pharmacist, [place]  II Phone [phone]

## 2020-02-01 ENCOUNTER — Encounter: Payer: Self-pay | Admitting: Pharmacist

## 2020-02-01 ENCOUNTER — Other Ambulatory Visit: Payer: Self-pay | Admitting: *Deleted

## 2020-02-01 MED ORDER — LEVOTHYROXINE SODIUM 88 MCG PO TABS
88.0000 ug | ORAL_TABLET | Freq: Every day | ORAL | 3 refills | Status: DC
Start: 2020-02-01 — End: 2020-10-17

## 2020-02-01 MED ORDER — BD SWAB SINGLE USE REGULAR PADS: MEDICATED_PAD | 3 refills | Status: DC

## 2020-02-27 ENCOUNTER — Ambulatory Visit: Payer: Medicare HMO | Admitting: Pharmacist

## 2020-03-20 DIAGNOSIS — M79671 Pain in right foot: Secondary | ICD-10-CM | POA: Diagnosis not present

## 2020-03-20 DIAGNOSIS — M722 Plantar fascial fibromatosis: Secondary | ICD-10-CM | POA: Diagnosis not present

## 2020-03-26 ENCOUNTER — Ambulatory Visit (INDEPENDENT_AMBULATORY_CARE_PROVIDER_SITE_OTHER): Payer: Medicare HMO | Admitting: Family

## 2020-03-26 NOTE — Progress Notes (Signed)
Pt called three times, no answer. Unable to leave voicemail. Will close chart.   [name], FNP

## 2020-04-10 ENCOUNTER — Encounter: Payer: Self-pay | Admitting: Nurse Practitioner

## 2020-04-10 ENCOUNTER — Other Ambulatory Visit: Payer: Self-pay

## 2020-04-10 ENCOUNTER — Ambulatory Visit (INDEPENDENT_AMBULATORY_CARE_PROVIDER_SITE_OTHER): Payer: Medicare HMO | Admitting: Nurse Practitioner

## 2020-04-10 DIAGNOSIS — R059 Cough, unspecified: Secondary | ICD-10-CM

## 2020-04-10 DIAGNOSIS — Z20822 Contact with and (suspected) exposure to covid-19: Secondary | ICD-10-CM | POA: Diagnosis not present

## 2020-04-10 MED ORDER — AZITHROMYCIN 250 MG PO TABS: ORAL_TABLET | ORAL | 0 refills | Status: DC

## 2020-04-10 MED ORDER — BENZONATATE 100 MG PO CAPS: 100.0000 mg | ORAL_CAPSULE | Freq: Three times a day (TID) | ORAL | 0 refills | Status: DC | PRN

## 2020-04-10 MED ORDER — ALBUTEROL SULFATE HFA 108 (90 BASE) MCG/ACT IN AERS: 2.0000 | INHALATION_SPRAY | Freq: Four times a day (QID) | RESPIRATORY_TRACT | 0 refills | Status: DC | PRN

## 2020-04-10 NOTE — Progress Notes (Signed)
Virtual Visit via telephone Note Due to COVID-19 pandemic this visit was conducted virtually. This visit type was conducted due to national recommendations for restrictions regarding the COVID-19 Pandemic (e.g. social distancing, sheltering in place) in an effort to limit this patient's exposure and mitigate transmission in our community. All issues noted in this document were discussed and addressed.  A physical exam was not performed with this format.  I connected with Jessica Torres on 04/10/20 at 12:55 by telephone and verified that I am speaking with the correct person using two identifiers. Jessica Torres is currently located at home and no one is currently with her during visit. The provider, Mary-Margaret Hassell Done, FNP is located in their office at time of visit.  I discussed the limitations, risks, security and privacy concerns of performing an evaluation and management service by telephone and the availability of in person appointments. I also discussed with the patient that there may be a patient responsible charge related to this service. The patient expressed understanding and agreed to proceed.   History and Present Illness:  Patient calls in stating that her son tested positive for covid about 3 weeks ago. She got sick several days later with cough, congestion and sob. She has lost her taste and smell. She had no way to come and =be tested because her son was sick. She is sure she had covid. The cough and sob has gotten no better. Has drainage down into her chest.   Review of Systems  Constitutional: Negative for chills and fever.  HENT: Positive for congestion, sinus pain and sore throat (slight). Negative for ear discharge and ear pain.   Respiratory: Positive for cough, sputum production (slight) and shortness of breath.   Musculoskeletal: Negative for myalgias (this has resolved).  Neurological: Positive for headaches.  All other systems reviewed and are  negative.    Observations/Objective: Alert and oriented- answers all questions appropriately No distress Voice hoarse Dry cough   Assessment and Plan: Jessica Torres in today with chief complaint of Covid Exposure   1. Cough  2. Close exposure to COVID-19 virus 1. Take meds as prescribed 2. Use a cool mist humidifier especially during the winter months and when heat has been humid. 3. Use saline nose sprays frequently 4. Saline irrigations of the nose can be very helpful if done frequently.  * 4X daily for 1 week*  * Use of a nettie pot can be helpful with this. Follow directions with this* 5. Drink plenty of fluids 6. Keep thermostat turn down low 7.For any cough or congestion  Use plain Mucinex- regular strength or max strength is fine   * Children- consult with Pharmacist for dosing 8. For fever or aces or pains- take tylenol or ibuprofen appropriate for age and weight.  * for fevers greater than 101 orally you may alternate ibuprofen and tylenol every  3 hours.   Meds ordered this encounter  Medications  . azithromycin (ZITHROMAX Z-PAK) 250 MG tablet    Sig: As directed    Dispense:  6 tablet    Refill:  0    Order Specific Question:   Supervising Provider    Answer:   Caryl Pina A [id number]  . albuterol (VENTOLIN HFA) 108 (90 Base) MCG/ACT inhaler    Sig: Inhale 2 puffs into the lungs every 6 (six) hours as needed for wheezing or shortness of breath.    Dispense:  8 g    Refill:  0  Order Specific Question:   Supervising Provider    Answer:   Caryl Pina A [id number]  . benzonatate (TESSALON PERLES) 100 MG capsule    Sig: Take 1 capsule (100 mg total) by mouth 3 (three) times daily as needed.    Dispense:  20 capsule    Refill:  0    Order Specific Question:   Supervising Provider    Answer:   Caryl Pina A [[id number]]         Follow Up Instructions: prn    I discussed the assessment and treatment plan with the patient. The patient  was provided an opportunity to ask questions and all were answered. The patient agreed with the plan and demonstrated an understanding of the instructions.   The patient was advised to call back or seek an in-person evaluation if the symptoms worsen or if the condition fails to improve as anticipated.  The above assessment and management plan was discussed with the patient. The patient verbalized understanding of and has agreed to the management plan. Patient is aware to call the clinic if symptoms persist or worsen. Patient is aware when to return to the clinic for a follow-up visit. Patient educated on when it is appropriate to go to the emergency department.   Time call ended:  1:10  I provided 15 minutes of non-face-to-face time during this encounter.    Mary-Margaret Hassell Done, FNP

## 2020-04-11 ENCOUNTER — Other Ambulatory Visit: Payer: Self-pay | Admitting: *Deleted

## 2020-04-11 DIAGNOSIS — E7800 Pure hypercholesterolemia, unspecified: Secondary | ICD-10-CM

## 2020-04-11 MED ORDER — FENOFIBRATE 160 MG PO TABS: 160.0000 mg | ORAL_TABLET | Freq: Every day | ORAL | 0 refills | Status: DC

## 2020-04-20 ENCOUNTER — Telehealth: Payer: Self-pay

## 2020-04-20 NOTE — Telephone Encounter (Signed)
Pt returned missed call. Reviewed MMMs note with her. Pt voiced understanding and said she would go ahead and start taking them and if she had any issues, she would give [place] a call back.

## 2020-04-20 NOTE — Telephone Encounter (Signed)
Pt called stating that she picked up her medicines from [facility] that were prescribed to her by MMM on 04/10/20. Pt says she has been taking her medicines as directed, but says she also received the same medicines from [facility] mail order pharmacy. Wants to know if it is ok to take them as well since her symptoms have not went away?  Please advise and call patient.

## 2020-04-20 NOTE — Telephone Encounter (Signed)
Yes that is fine

## 2020-04-20 NOTE — Telephone Encounter (Signed)
[id number]

## 2020-04-24 DIAGNOSIS — M722 Plantar fascial fibromatosis: Secondary | ICD-10-CM | POA: Diagnosis not present

## 2020-04-26 ENCOUNTER — Ambulatory Visit (INDEPENDENT_AMBULATORY_CARE_PROVIDER_SITE_OTHER): Payer: Medicare HMO | Admitting: Family

## 2020-04-26 ENCOUNTER — Other Ambulatory Visit: Payer: Self-pay

## 2020-04-26 ENCOUNTER — Encounter: Payer: Self-pay | Admitting: Family

## 2020-04-26 VITALS — BP 131/63 | HR 74 | Temp 97.4°F | Ht 65.0 in | Wt 144.8 lb

## 2020-04-26 DIAGNOSIS — K219 Gastro-esophageal reflux disease without esophagitis: Secondary | ICD-10-CM | POA: Diagnosis not present

## 2020-04-26 DIAGNOSIS — E559 Vitamin D deficiency, unspecified: Secondary | ICD-10-CM | POA: Diagnosis not present

## 2020-04-26 DIAGNOSIS — I152 Hypertension secondary to endocrine disorders: Secondary | ICD-10-CM

## 2020-04-26 DIAGNOSIS — G720 Drug-induced myopathy: Secondary | ICD-10-CM | POA: Diagnosis not present

## 2020-04-26 DIAGNOSIS — E1159 Type 2 diabetes mellitus with other circulatory complications: Secondary | ICD-10-CM

## 2020-04-26 DIAGNOSIS — E7800 Pure hypercholesterolemia, unspecified: Secondary | ICD-10-CM | POA: Diagnosis not present

## 2020-04-26 DIAGNOSIS — Z794 Long term (current) use of insulin: Secondary | ICD-10-CM | POA: Diagnosis not present

## 2020-04-26 DIAGNOSIS — I2510 Atherosclerotic heart disease of native coronary artery without angina pectoris: Secondary | ICD-10-CM

## 2020-04-26 DIAGNOSIS — I739 Peripheral vascular disease, unspecified: Secondary | ICD-10-CM

## 2020-04-26 DIAGNOSIS — G8929 Other chronic pain: Secondary | ICD-10-CM

## 2020-04-26 DIAGNOSIS — I2583 Coronary atherosclerosis due to lipid rich plaque: Secondary | ICD-10-CM | POA: Diagnosis not present

## 2020-04-26 DIAGNOSIS — M545 Low back pain, unspecified: Secondary | ICD-10-CM

## 2020-04-26 DIAGNOSIS — E039 Hypothyroidism, unspecified: Secondary | ICD-10-CM | POA: Diagnosis not present

## 2020-04-26 LAB — BAYER DCA HB A1C WAIVED: HB A1C (BAYER DCA - WAIVED): 8.6 % — ABNORMAL HIGH (ref <7.0)

## 2020-04-26 MED ORDER — ERGOCALCIFEROL 1.25 MG (50000 UT) PO CAPS: 50000.0000 [IU] | ORAL_CAPSULE | ORAL | 3 refills | Status: DC

## 2020-04-26 MED ORDER — PANTOPRAZOLE SODIUM 40 MG PO TBEC: 40.0000 mg | DELAYED_RELEASE_TABLET | Freq: Every day | ORAL | 3 refills | Status: DC

## 2020-04-26 NOTE — Progress Notes (Signed)
Subjective:    Patient ID: Jessica Torres, female    DOB: 01-Apr-1940, 80 y.o.   MRN: [id number]  Chief Complaint  Patient presents with  . Diabetes   Pt presents to the office today for chronic follow up.She is followed by Vascular for PAD every  6 months. She is followed by Podiatry for heel spur.  Diabetes She presents for her follow-up diabetic visit. She has type 2 diabetes mellitus. Her disease course has been stable. There are no hypoglycemic associated symptoms. Associated symptoms include foot paresthesias. Pertinent negatives for diabetes include no blurred vision and no fatigue. Symptoms are stable. She is following a generally healthy diet. An ACE inhibitor/angiotensin II receptor blocker is being taken. Eye exam is current.  Hypertension This is a chronic problem. The current episode started more than 1 year ago. The problem has been resolved since onset. The problem is controlled. Associated symptoms include malaise/fatigue. Pertinent negatives include no blurred vision, peripheral edema or shortness of breath. Risk factors for coronary artery disease include dyslipidemia, diabetes mellitus, obesity and sedentary lifestyle. The current treatment provides moderate improvement. Identifiable causes of hypertension include a thyroid problem.  Gastroesophageal Reflux She complains of belching and heartburn. She reports no hoarse voice. This is a chronic problem. The current episode started more than 1 year ago. The problem occurs occasionally. The problem has been waxing and waning. Pertinent negatives include no fatigue. She has tried a PPI for the symptoms. The treatment provided moderate relief.  Thyroid Problem Presents for follow-up visit. Symptoms include dry skin. Patient reports no constipation, depressed mood, fatigue or hoarse voice. The symptoms have been stable. Her past medical history is significant for hyperlipidemia.  Hyperlipidemia This is a chronic problem. The current  episode started more than 1 year ago. The problem is controlled. Exacerbating diseases include obesity. Pertinent negatives include no shortness of breath. Current antihyperlipidemic treatment includes herbal therapy and ezetimibe. The current treatment provides moderate improvement of lipids. Risk factors for coronary artery disease include dyslipidemia, diabetes mellitus, hypertension, post-menopausal and a sedentary lifestyle.  Back Pain This is a chronic problem. The current episode started more than 1 year ago. The problem occurs intermittently. The problem has been waxing and waning since onset. The pain is present in the lumbar spine. The quality of the pain is described as aching. The pain is at a severity of 2/10. The pain is mild.      Review of Systems  Constitutional: Positive for malaise/fatigue. Negative for fatigue.  HENT: Negative for hoarse voice.   Eyes: Negative for blurred vision.  Respiratory: Negative for shortness of breath.   Gastrointestinal: Positive for heartburn. Negative for constipation.  Musculoskeletal: Positive for back pain.  All other systems reviewed and are negative.      Objective:   Physical Exam Vitals reviewed.  Constitutional:      General: She is not in acute distress.    Appearance: She is well-developed and well-nourished.  HENT:     Head: Normocephalic and atraumatic.     Right Ear: Tympanic membrane normal.     Left Ear: Tympanic membrane normal.     Mouth/Throat:     Mouth: Oropharynx is clear and moist.  Eyes:     Pupils: Pupils are equal, round, and reactive to light.  Neck:     Thyroid: No thyromegaly.  Cardiovascular:     Rate and Rhythm: Normal rate and regular rhythm.     Pulses: Intact distal pulses.  Heart sounds: Normal heart sounds. No murmur heard.   Pulmonary:     Effort: Pulmonary effort is normal. No respiratory distress.     Breath sounds: Normal breath sounds. No wheezing.  Abdominal:     General: Bowel  sounds are normal. There is no distension.     Palpations: Abdomen is soft.     Tenderness: There is no abdominal tenderness.  Musculoskeletal:        General: No tenderness or edema. Normal range of motion.     Cervical back: Normal range of motion and neck supple.  Skin:    General: Skin is warm and dry.  Neurological:     Mental Status: She is alert and oriented to person, place, and time.     Cranial Nerves: No cranial nerve deficit.     Deep Tendon Reflexes: Reflexes are normal and symmetric.  Psychiatric:        Mood and Affect: Mood and affect normal.        Behavior: Behavior normal.        Thought Content: Thought content normal.        Judgment: Judgment normal.       BP 131/63   Pulse 74   Temp (!) 97.4 F (36.3 C) (Temporal)   Ht 5' 5"  (1.651 m)   Wt 144 lb 12.8 oz (65.7 kg)   BMI 24.10 kg/m      Assessment & Plan:  NAIYAH KLOSTERMANN comes in today with chief complaint of Diabetes   Diagnosis and orders addressed:  1. Hypertension associated with diabetes ([place]) - CMP14+EGFR - CBC with Differential/Platelet - AMB Referral to [facility]  2. Coronary artery disease due to lipid rich plaque - CMP14+EGFR - CBC with Differential/Platelet - AMB Referral to [facility]  3. Peripheral arterial disease - Bilateral SFA  - CMP14+EGFR - CBC with Differential/Platelet - AMB Referral to [facility]  4. Gastroesophageal reflux disease without esophagitis  - CMP14+EGFR - CBC with Differential/Platelet - AMB Referral to [facility]  5. Type 2 diabetes mellitus with other circulatory complication, with long-term current use of insulin ([facility]) - CMP14+EGFR - CBC with Differential/Platelet - Bayer DCA Hb A1c Waived - AMB Referral to [facility]  6. Hypothyroidism, unspecified type - CMP14+EGFR - CBC with Differential/Platelet - AMB Referral to [facility]  7. Drug-induced  myopathy - CMP14+EGFR - CBC with Differential/Platelet - AMB Referral to [facility]  8. Pure hypercholesterolemia  - CMP14+EGFR - CBC with Differential/Platelet - AMB Referral to [facility]  9. Claudication in peripheral vascular disease ([place]) - CMP14+EGFR - CBC with Differential/Platelet - AMB Referral to [facility]  10. Vitamin D deficiency - CMP14+EGFR - CBC with Differential/Platelet - VITAMIN D 25 Hydroxy (Vit-D Deficiency, Fractures) - AMB Referral to [facility]  11. Chronic bilateral low back pain without sciatica - CMP14+EGFR - CBC with Differential/Platelet - AMB Referral to [facility] pending Health Maintenance reviewed Diet and exercise encouraged  Follow up plan: 3 months    [name], FNP

## 2020-04-26 NOTE — Patient Instructions (Signed)
Diabetes Mellitus and Nutrition, Adult When you have diabetes, or diabetes mellitus, it is very important to have healthy eating habits because your blood sugar (glucose) levels are greatly affected by what you eat and drink. Eating healthy foods in the right amounts, at about the same times every day, can help you:  Control your blood glucose.  Lower your risk of heart disease.  Improve your blood pressure.  Reach or maintain a healthy weight. What can affect my meal plan? Every person with diabetes is different, and each person has different needs for a meal plan. Your health care provider may recommend that you work with a dietitian to make a meal plan that is best for you. Your meal plan may vary depending on factors such as:  The calories you need.  The medicines you take.  Your weight.  Your blood glucose, blood pressure, and cholesterol levels.  Your activity level.  Other health conditions you have, such as heart or kidney disease. How do carbohydrates affect me? Carbohydrates, also called carbs, affect your blood glucose level more than any other type of food. Eating carbs naturally raises the amount of glucose in your blood. Carb counting is a method for keeping track of how many carbs you eat. Counting carbs is important to keep your blood glucose at a healthy level, especially if you use insulin or take certain oral diabetes medicines. It is important to know how many carbs you can safely have in each meal. This is different for every person. Your dietitian can help you calculate how many carbs you should have at each meal and for each snack. How does alcohol affect me? Alcohol can cause a sudden decrease in blood glucose (hypoglycemia), especially if you use insulin or take certain oral diabetes medicines. Hypoglycemia can be a life-threatening condition. Symptoms of hypoglycemia, such as sleepiness, dizziness, and confusion, are similar to symptoms of having too much  alcohol.  Do not drink alcohol if: ? Your health care provider tells you not to drink. ? You are pregnant, may be pregnant, or are planning to become pregnant.  If you drink alcohol: ? Do not drink on an empty stomach. ? Limit how much you use to:  0-1 drink a day for women.  0-2 drinks a day for men. ? Be aware of how much alcohol is in your drink. In the [place] one drink equals one 12 oz bottle of beer (355 mL), one 5 oz glass of wine (148 mL), or one 1 oz glass of hard liquor (44 mL). ? Keep yourself hydrated with water, diet soda, or unsweetened iced tea.  Keep in mind that regular soda, juice, and other mixers may contain a lot of sugar and must be counted as carbs. What are tips for following this plan? Reading food labels  Start by checking the serving size on the "Nutrition Facts" label of packaged foods and drinks. The amount of calories, carbs, fats, and other nutrients listed on the label is based on one serving of the item. Many items contain more than one serving per package.  Check the total grams (g) of carbs in one serving. You can calculate the number of servings of carbs in one serving by dividing the total carbs by 15. For example, if a food has 30 g of total carbs per serving, it would be equal to 2 servings of carbs.  Check the number of grams (g) of saturated fats and trans fats in one serving. Choose foods that have   a low amount or none of these fats.  Check the number of milligrams (mg) of salt (sodium) in one serving. Most people should limit total sodium intake to less than 2,300 mg per day.  Always check the nutrition information of foods labeled as "low-fat" or "nonfat." These foods may be higher in added sugar or refined carbs and should be avoided.  Talk to your dietitian to identify your daily goals for nutrients listed on the label. Shopping  Avoid buying canned, pre-made, or processed foods. These foods tend to be high in fat, sodium, and added  sugar.  Shop around the outside edge of the grocery store. This is where you will most often find fresh fruits and vegetables, bulk grains, fresh meats, and fresh dairy. Cooking  Use low-heat cooking methods, such as baking, instead of high-heat cooking methods like deep frying.  Cook using healthy oils, such as olive, canola, or sunflower oil.  Avoid cooking with butter, cream, or high-fat meats. Meal planning  Eat meals and snacks regularly, preferably at the same times every day. Avoid going long periods of time without eating.  Eat foods that are high in fiber, such as fresh fruits, vegetables, beans, and whole grains. Talk with your dietitian about how many servings of carbs you can eat at each meal.  Eat 4-6 oz (112-168 g) of lean protein each day, such as lean meat, chicken, fish, eggs, or tofu. One ounce (oz) of lean protein is equal to: ? 1 oz (28 g) of meat, chicken, or fish. ? 1 egg. ?  cup (62 g) of tofu.  Eat some foods each day that contain healthy fats, such as avocado, nuts, seeds, and fish.   What foods should I eat? Fruits Berries. Apples. Oranges. Peaches. Apricots. Plums. Grapes. Mango. Papaya. Pomegranate. Kiwi. Cherries. Vegetables Lettuce. Spinach. Leafy greens, including kale, chard, collard greens, and mustard greens. Beets. Cauliflower. Cabbage. Broccoli. Carrots. Green beans. Tomatoes. Peppers. Onions. Cucumbers. Brussels sprouts. Grains Whole grains, such as whole-wheat or whole-grain bread, crackers, tortillas, cereal, and pasta. Unsweetened oatmeal. Quinoa. Brown or wild rice. Meats and other proteins Seafood. Poultry without skin. Lean cuts of poultry and beef. Tofu. Nuts. Seeds. Dairy Low-fat or fat-free dairy products such as milk, yogurt, and cheese. The items listed above may not be a complete list of foods and beverages you can eat. Contact a dietitian for more information. What foods should I avoid? Fruits Fruits canned with  syrup. Vegetables Canned vegetables. Frozen vegetables with butter or cream sauce. Grains Refined white flour and flour products such as bread, pasta, snack foods, and cereals. Avoid all processed foods. Meats and other proteins Fatty cuts of meat. Poultry with skin. Breaded or fried meats. Processed meat. Avoid saturated fats. Dairy Full-fat yogurt, cheese, or milk. Beverages Sweetened drinks, such as soda or iced tea. The items listed above may not be a complete list of foods and beverages you should avoid. Contact a dietitian for more information. Questions to ask a health care provider  Do I need to meet with a diabetes educator?  Do I need to meet with a dietitian?  What number can I call if I have questions?  When are the best times to check my blood glucose? Where to find more information:  American Diabetes Association: diabetes.org  Academy of Nutrition and Dietetics: www.eatright.org  National Institute of Diabetes and Digestive and Kidney Diseases: www.niddk.nih.gov  Association of Diabetes Care and Education Specialists: www.diabeteseducator.org Summary  It is important to have healthy eating   habits because your blood sugar (glucose) levels are greatly affected by what you eat and drink.  A healthy meal plan will help you control your blood glucose and maintain a healthy lifestyle.  Your health care provider may recommend that you work with a dietitian to make a meal plan that is best for you.  Keep in mind that carbohydrates (carbs) and alcohol have immediate effects on your blood glucose levels. It is important to count carbs and to use alcohol carefully. This information is not intended to replace advice given to you by your health care provider. Make sure you discuss any questions you have with your health care provider. Document Revised: 02/01/2019 Document Reviewed: 02/01/2019 Elsevier Patient Education  2021 Elsevier Inc.  

## 2020-04-27 ENCOUNTER — Other Ambulatory Visit: Payer: Self-pay | Admitting: Family

## 2020-04-27 LAB — CMP14+EGFR
ALT: 16 IU/L (ref 0–32)
AST: 35 IU/L (ref 0–40)
Albumin/Globulin Ratio: 1.4 (ref 1.2–2.2)
Albumin: 3.9 g/dL (ref 3.7–4.7)
Alkaline Phosphatase: 69 IU/L (ref 44–121)
BUN/Creatinine Ratio: 13 (ref 12–28)
BUN: 22 mg/dL (ref 8–27)
Bilirubin Total: 0.3 mg/dL (ref 0.0–1.2)
CO2: 20 mmol/L (ref 20–29)
Calcium: 9.7 mg/dL (ref 8.7–10.3)
Chloride: 97 mmol/L (ref 96–106)
Creatinine, Ser: 1.76 mg/dL — ABNORMAL HIGH (ref 0.57–1.00)
GFR calc Af Amer: 32 mL/min/{1.73_m2} — ABNORMAL LOW (ref >59)
GFR calc non Af Amer: 28 mL/min/{1.73_m2} — ABNORMAL LOW (ref >59)
Globulin, Total: 2.7 g/dL (ref 1.5–4.5)
Glucose: 230 mg/dL — ABNORMAL HIGH (ref 65–99)
Potassium: 4.9 mmol/L (ref 3.5–5.2)
Sodium: 135 mmol/L (ref 134–144)
Total Protein: 6.6 g/dL (ref 6.0–8.5)

## 2020-04-27 LAB — CBC WITH DIFFERENTIAL/PLATELET
Basophils Absolute: 0.1 10*3/uL (ref 0.0–0.2)
Basos: 2 %
EOS (ABSOLUTE): 0.5 10*3/uL — ABNORMAL HIGH (ref 0.0–0.4)
Eos: 7 %
Hematocrit: 38.4 % (ref 34.0–46.6)
Hemoglobin: 12.7 g/dL (ref 11.1–15.9)
Immature Grans (Abs): 0 10*3/uL (ref 0.0–0.1)
Immature Granulocytes: 0 %
Lymphocytes Absolute: 2 10*3/uL (ref 0.7–3.1)
Lymphs: 28 %
MCH: 30.2 pg (ref 26.6–33.0)
MCHC: 33.1 g/dL (ref 31.5–35.7)
MCV: 91 fL (ref 79–97)
Monocytes Absolute: 0.9 10*3/uL (ref 0.1–0.9)
Monocytes: 12 %
Neutrophils Absolute: 3.7 10*3/uL (ref 1.4–7.0)
Neutrophils: 51 %
Platelets: 380 10*3/uL (ref 150–450)
RBC: 4.21 x10E6/uL (ref 3.77–5.28)
RDW: 12.9 % (ref 11.7–15.4)
WBC: 7.2 10*3/uL (ref 3.4–10.8)

## 2020-04-27 LAB — VITAMIN D 25 HYDROXY (VIT D DEFICIENCY, FRACTURES): Vit D, 25-Hydroxy: 34.9 ng/mL (ref 30.0–100.0)

## 2020-04-30 ENCOUNTER — Other Ambulatory Visit: Payer: Self-pay | Admitting: Family

## 2020-04-30 MED ORDER — EMPAGLIFLOZIN 25 MG PO TABS: 25.0000 mg | ORAL_TABLET | Freq: Every day | ORAL | 3 refills | Status: DC

## 2020-05-09 ENCOUNTER — Telehealth: Payer: Self-pay

## 2020-05-09 NOTE — Chronic Care Management (AMB) (Signed)
  Chronic Care Management   Note  05/09/2020 Name: Jessica Torres MRN: [id number] DOB: [birth year]  [name] is a 80 y.o. year old female who is a primary care patient of Sharion Balloon, FNP. I reached out to [name] by phone today in response to a referral sent by [name]'s PCP, Sharion Balloon, FNP     [name] was given information about Chronic Care Management services today including:  1. CCM service includes personalized support from designated clinical staff supervised by her physician, including individualized plan of care and coordination with other care providers 2. 24/7 contact phone numbers for assistance for urgent and routine care needs. 3. Service will only be billed when office clinical staff spend 20 minutes or more in a month to coordinate care. 4. Only one practitioner may furnish and bill the service in a calendar month. 5. The patient may stop CCM services at any time (effective at the end of the month) by phone call to the office staff. 6. The patient will be responsible for cost sharing (co-pay) of up to 20% of the service fee (after annual deductible is met).  Patient agreed to services and verbal consent obtained.   Follow up plan: Telephone appointment with care management team member scheduled for:05/22/2020  Noreene Larsson, [place], [place], [place] [address] Direct Dial: [phone] [email or web address] Website: [email or web address]

## 2020-05-22 ENCOUNTER — Ambulatory Visit (INDEPENDENT_AMBULATORY_CARE_PROVIDER_SITE_OTHER): Payer: Medicare HMO | Admitting: *Deleted

## 2020-05-22 DIAGNOSIS — I152 Hypertension secondary to endocrine disorders: Secondary | ICD-10-CM

## 2020-05-22 DIAGNOSIS — Z794 Long term (current) use of insulin: Secondary | ICD-10-CM | POA: Diagnosis not present

## 2020-05-22 DIAGNOSIS — E1159 Type 2 diabetes mellitus with other circulatory complications: Secondary | ICD-10-CM | POA: Diagnosis not present

## 2020-05-22 NOTE — Patient Instructions (Signed)
Visit Information  PATIENT GOALS:  Goals Addressed            This Visit's Progress   . Monitor and Manage My Blood Sugar-Diabetes Type 2       Timeframe:  Long-Range Goal Priority:  High Start Date:   05/22/20                          Expected End Date: 03/09/21                       Follow Up Date 06/05/20    . See PCP on 05/24/20 . Discuss Freestyle Libre with PCP and decide if she would like to use that to monitor blood sugar . Check blood sugar before each meal and at bedtime . Write blood sugar in log . Take humalog before meals and toujeo at bedtime . Write insulin down in log . Eat a low carb diet at regular intervals to keep blood sugar stable . Complete patient portion of prescription assistance application and return to [facility] . Call PCP [phone] if blood sugar is below 70 or above 250 . Call [facility] as needed [phone]    Why is this important?    Checking your blood sugar at home helps to keep it from getting very high or very low.   Writing the results in a diary or log helps the doctor know how to care for you.   Your blood sugar log should have the time, date and the results.   Also, write down the amount of insulin or other medicine that you take.   Other information, like what you ate, exercise done and how you were feeling, will also be helpful.     Notes:        Consent to CCM Services: Ms. Gasiorowski was given information about Chronic Care Management services today including:  1. CCM service includes personalized support from designated clinical staff supervised by her physician, including individualized plan of care and coordination with other care providers 2. 24/7 contact phone numbers for assistance for urgent and routine care needs. 3. Service will only be billed when office clinical staff spend 20 minutes or more in a month to coordinate care. 4. Only one practitioner may furnish and bill the service in a calendar month. 5. The patient may stop  CCM services at any time (effective at the end of the month) by phone call to the office staff. 6. The patient will be responsible for cost sharing (co-pay) of up to 20% of the service fee (after annual deductible is met).  Patient agreed to services and verbal consent obtained.   The patient verbalized understanding of instructions, educational materials, and care plan provided today and agreed to receive a mailed copy of patient instructions, educational materials, and care plan.    Follow Up Plan:  . Telephone follow up appointment with care management team member scheduled for: 06/05/20 with RNCM . The patient has been provided with contact information for the care management team and has been advised to call with any health related questions or concerns.  . Next PCP appointment scheduled for: 05/24/20 with [name], FNP  Chong Sicilian, BSN, RN-BC [facility] Direct Dial: [phone]   CLINICAL CARE PLAN: Patient Care Plan: RNCM: Diabetes Type 2 (Adult)    Problem Identified: Glycemic Management (Diabetes, Type 2)  Long-Range Goal: Glycemic Management Optimized   Start Date: 05/22/2020  This Visit's Progress: Not on track  Priority: High  Note:   Current Barriers:  Marland Kitchen Knowledge Deficits related to when to take blood sugar and insulin . Chronic Disease Management support and education needs related to self-management of diabetes in a patient with hypertension and hyperlipidemia . Knowledge deficit related to insulin dosage directions . Sore fingers from frequency of testing  Nurse Case Manager Clinical Goal(s):  . patient will verbalize understanding of plan for insulin dosing and scheduling . patient will work with PCP to address needs related to medical management of diabetes . patient will meet with RN Care Manager to address self-management of diabetes  Interventions:  . 1:1 collaboration  with Sharion Balloon, FNP regarding development and update of comprehensive plan of care as evidenced by provider attestation and co-signature . Inter-disciplinary care team collaboration (see longitudinal plan of care) . Evaluation of current treatment plan related to diabetes and patient's adherence to plan as established by provider. . Chart reviewed including relevant office notes and lab results . Reviewed and discussed most recent A1C result and improvement over past 10 months . Reviewed and discussed medications o Medications are affordable at present but is coming up on doughnut hole due to high cost to insurance - Will assist with Jardiance prescription assistance through [employer] - Can apply for assistance through [facility] for insulin and other meds once hits doughnut hole o Has some confusion about Humalog directions - Does not take it regularly at meals and will take at bedtime with toujeo if blood sugar is "too high" o Compliant with toujeo and jardiance o Verbal education provided that she is to take Humalog 10-20 units before breakfast, lunch, and dinner based on blood sugar results. She should not take it at bedtime with Toujeo.  Marland Kitchen Reached out to PCP to confirm Humalog directions. Awaiting response.  o Is Humalog 10-20 sliding scale and if so what is the scale o Requested that PCP respond to me with directions and also discuss with patient at visit on 05/24/20 o Printed instructions will be left for patient to pickup at office visit once dose is confirmed . Discussed home blood sugar testing o Testing 2-3 times a day o Fasting is usually in low 100s o Now readings below 70 recently o Provided verbal education that she is to check it before meals and at bedtime o Printed instructions will be left at office for patient to pickup at visit on 05/24/20 . Printed blood sugar/insulin log for patient to pick up at appt o Provided education on how  to complete blood sugar/insulin log . Discussed Freestyle Libre 14 (CGM) day vs fingersticks o Provided information on how it is used and benefits it provides o Patient is interested and would like more information o Printed materials will be left for patient to pickup at [place] o Asked PCP to discussed Chicken at visit and to send Rx to a Medicare diabetes DME supplier if they decide it is appropriate . Discussed diet o Encouraged low carb diet and to eat at regular intervals to maintain blood sugar . Provided with RNCM contact number and encouraged to reach out as needed . Reviewed appointment with PCP on 05/24/20  Patient Goals/Self-Care Activities Over the next 30 days, patient will: . See PCP on 05/24/20 . Discuss Freestyle Libre with PCP and decide if she would like to use that to monitor blood sugar .  Check blood sugar before each meal and at bedtime . Write blood sugar in log . Take humalog before meals and toujeo at bedtime . Write insulin down in log . Eat a low carb diet at regular intervals to keep blood sugar stable . Complete patient portion of prescription assistance application and return to [facility] . Call PCP [phone] if blood sugar is below 70 or above 250 . Call [facility] as needed [phone]  Follow Up Plan:  . Telephone follow up appointment with care management team member scheduled for: 06/05/20 with RNCM . The patient has been provided with contact information for the care management team and has been advised to call with any health related questions or concerns.  . Next PCP appointment scheduled for: 05/24/20 with [name], FNP

## 2020-05-22 NOTE — Chronic Care Management (AMB) (Signed)
Chronic Care Management   CCM RN Visit Note  05/22/2020 Name: Jessica Torres MRN: [id number] DOB: 1940-06-10  Subjective: Jessica Torres is a 80 y.o. year old female who is a primary care patient of Sharion Balloon, FNP. The care management team was consulted for assistance with disease management and care coordination needs.    Engaged with patient by telephone for initial visit in response to provider referral for case management and/or care coordination services.   Consent to Services:  The patient was given the following information about Chronic Care Management services today, agreed to services, and gave verbal consent: 1. CCM service includes personalized support from designated clinical staff supervised by the primary care provider, including individualized plan of care and coordination with other care providers 2. 24/7 contact phone numbers for assistance for urgent and routine care needs. 3. Service will only be billed when office clinical staff spend 20 minutes or more in a month to coordinate care. 4. Only one practitioner may furnish and bill the service in a calendar month. 5.The patient may stop CCM services at any time (effective at the end of the month) by phone call to the office staff. 6. The patient will be responsible for cost sharing (co-pay) of up to 20% of the service fee (after annual deductible is met). Patient agreed to services and consent obtained.  Patient agreed to services and verbal consent obtained.   Assessment: Review of patient past medical history, allergies, medications, health status, including review of consultants reports, laboratory and other test data, was performed as part of comprehensive evaluation and provision of chronic care management services.   SDOH (Social Determinants of Health) assessments and interventions performed:  SDOH Interventions   Flowsheet Row Most Recent Value  SDOH Interventions   Physical Activity Interventions [facility]        CCM Care Plan  Allergies  Allergen Reactions  . Morphine And Related Swelling and Other (See Comments)    Facial swelling  . Latex Rash  . Statins Other (See Comments)    Myalgias     Outpatient Encounter Medications as of 05/22/2020  Medication Sig  . ACCU-CHEK AVIVA PLUS test strip TEST BLOOD SUGAR FOUR TIMES DAILY  . Accu-Chek Softclix Lancets lancets Use as instructed to test BG up to 3 times daily  . albuterol (VENTOLIN HFA) 108 (90 Base) MCG/ACT inhaler Inhale 2 puffs into the lungs every 6 (six) hours as needed for wheezing or shortness of breath. (Patient not taking: Reported on 05/22/2020)  . Alcohol Swabs (B-D SINGLE USE SWABS REGULAR) PADS Test BS 4 times daily Dx E11.9  . aspirin EC 81 MG tablet Take 81 mg by mouth daily.  . BD PEN NEEDLE NANO 2ND GEN 32G X 4 MM MISC Inject 1 application into the skin in the morning and at bedtime.  . clopidogrel (PLAVIX) 75 MG tablet Take 1 tablet (75 mg total) by mouth daily. (Patient not taking: Reported on 04/26/2020)  . empagliflozin (JARDIANCE) 25 MG TABS tablet Take 1 tablet (25 mg total) by mouth daily before breakfast. CANCEL JARDIANCE 10MG PRESCRIPTION  . ergocalciferol (VITAMIN D2) 1.25 MG (50000 UT) capsule Take 1 capsule (50,000 Units total) by mouth once a week. No specific day  . ezetimibe (ZETIA) 10 MG tablet Take 1 tablet (10 mg total) by mouth daily.  . fenofibrate 160 MG tablet Take 1 tablet (160 mg total) by mouth daily.  . insulin glargine, 2 Unit Dial, (TOUJEO MAX SOLOSTAR) 300 UNIT/ML Solostar  Pen Inject 48 Units into the skin daily.  . insulin lispro (HUMALOG) 100 UNIT/ML injection Inject 10-20 Units into the skin 3 (three) times daily before meals.  Marland Kitchen levothyroxine (SYNTHROID) 88 MCG tablet Take 1 tablet (88 mcg total) by mouth daily before breakfast.  . losartan-hydrochlorothiazide (HYZAAR) 50-12.5 MG tablet Take 1 tablet by mouth daily.  . pantoprazole (PROTONIX) 40 MG tablet Take 1 tablet (40 mg total) by mouth  daily.  . [DISCONTINUED] metFORMIN (GLUCOPHAGE) 1000 MG tablet Take 1 tablet (1,000 mg total) by mouth 2 (two) times daily with a meal. (Patient taking differently: Take 1,000 mg by mouth 2 (two) times daily with a meal. Only taking once a day)   No facility-administered encounter medications on file as of 05/22/2020.    Patient Active Problem List   Diagnosis Date Noted  . 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  . Chronic bilateral low back pain without sciatica 07/13/2019  . Carotid stenosis, right 12/10/2017  . Left hip pain 06/04/2017  . Claudication in peripheral vascular disease ([place]) 10/30/2016  . Vitamin D deficiency 05/07/2016  . Gastroesophageal reflux disease without esophagitis 01/28/2016  . Coronary artery disease due to lipid rich plaque 10/18/2015  . Hypothyroidism 10/18/2015  . Shortness of breath 09/15/2014  . S/P -[place] SFA PTA with restenosis  07/06/2014  . Hypertension associated with diabetes ([place]) 06/28/2014  . Hyperlipidemia-statin intolerant 06/28/2014  . DM (diabetes mellitus) ([place]) 06/28/2014  . Peripheral arterial disease - Bilateral SFA  06/28/2014    Conditions to be addressed/monitored:HTN, HLD and DMII  Care Plan : RNCM: Diabetes Type 2 (Adult)  Updates made by Ilean China, RN since 05/22/2020 12:00 AM    Problem: Glycemic Management (Diabetes, Type 2)     Long-Range Goal: Glycemic Management Optimized   Start Date: 05/22/2020  This Visit's Progress: Not on track  Priority: High  Note:   Current Barriers:  Marland Kitchen Knowledge Deficits related to when to take blood sugar and insulin . Chronic Disease Management support and education needs related to self-management of diabetes in a patient with hypertension and hyperlipidemia . Knowledge deficit related to insulin dosage directions . Sore fingers from frequency of testing  Nurse Case Manager Clinical Goal(s):  . patient will verbalize understanding of plan for insulin dosing and scheduling . patient will  work with PCP to address needs related to medical management of diabetes . patient will meet with RN Care Manager to address self-management of diabetes  Interventions:  . 1:1 collaboration with Sharion Balloon, FNP regarding development and update of comprehensive plan of care as evidenced by provider attestation and co-signature . Inter-disciplinary care team collaboration (see longitudinal plan of care) . Evaluation of current treatment plan related to diabetes and patient's adherence to plan as established by provider. . Chart reviewed including relevant office notes and lab results . Reviewed and discussed most recent A1C result and improvement over past 10 months . Reviewed and discussed medications o Medications are affordable at present but is coming up on doughnut hole due to high cost to insurance - Will assist with Jardiance prescription assistance through [employer] - Can apply for assistance through [place] for insulin and other meds once hits doughnut hole o Has some confusion about Humalog directions - Does not take it regularly at meals and will take at bedtime with toujeo if blood sugar is "too high" o Compliant with toujeo and jardiance o Verbal education provided that she is to take Humalog 10-20 units before  breakfast, lunch, and dinner based on blood sugar results. She should not take it at bedtime with Toujeo.  Marland Kitchen Reached out to PCP to confirm Humalog directions. Awaiting response.  o Is Humalog 10-20 sliding scale and if so what is the scale o Requested that PCP respond to me with directions and also discuss with patient at visit on 05/24/20 o Printed instructions will be left for patient to pickup at office visit once dose is confirmed . Discussed home blood sugar testing o Testing 2-3 times a day o Fasting is usually in low 100s o Now readings below 70 recently o Provided verbal education that she is to check it before meals and  at bedtime o Printed instructions will be left at office for patient to pickup at visit on 05/24/20 . Printed blood sugar/insulin log for patient to pick up at appt o Provided education on how to complete blood sugar/insulin log . Discussed Freestyle Libre 14 (CGM) day vs fingersticks o Provided information on how it is used and benefits it provides o Patient is interested and would like more information o Printed materials will be left for patient to pickup at [place] [facility] o Asked PCP to discussed [place] at visit and to send Rx to a Medicare diabetes DME supplier if they decide it is appropriate . Discussed diet o Encouraged low carb diet and to eat at regular intervals to maintain blood sugar . Provided with RNCM contact number and encouraged to reach out as needed . Reviewed appointment with PCP on 05/24/20  Patient Goals/Self-Care Activities Over the next 30 days, patient will: . See PCP on 05/24/20 . Discuss Freestyle Libre with PCP and decide if she would like to use that to monitor blood sugar . Check blood sugar before each meal and at bedtime . Write blood sugar in log . Take humalog before meals and toujeo at bedtime . Write insulin down in log . Eat a low carb diet at regular intervals to keep blood sugar stable . Complete patient portion of prescription assistance application and return to [facility] . Call PCP [phone] if blood sugar is below 70 or above 250 . Call [facility] as needed [phone]      Follow Up Plan:  . Telephone follow up appointment with care management team member scheduled for: 06/05/20 with RNCM . The patient has been provided with contact information for the care management team and has been advised to call with any health related questions or concerns.  . Next PCP appointment scheduled for: 05/24/20 with [name], FNP  Chong Sicilian, BSN, RN-BC [place] [facility] [place] Management Direct Dial: [phone]

## 2020-05-24 ENCOUNTER — Ambulatory Visit (INDEPENDENT_AMBULATORY_CARE_PROVIDER_SITE_OTHER): Payer: Medicare HMO | Admitting: Family

## 2020-05-24 ENCOUNTER — Other Ambulatory Visit: Payer: Self-pay

## 2020-05-24 ENCOUNTER — Encounter: Payer: Self-pay | Admitting: Family

## 2020-05-24 VITALS — BP 107/57 | HR 69 | Temp 97.9°F | Ht 65.0 in | Wt 146.2 lb

## 2020-05-24 DIAGNOSIS — E1159 Type 2 diabetes mellitus with other circulatory complications: Secondary | ICD-10-CM

## 2020-05-24 DIAGNOSIS — I152 Hypertension secondary to endocrine disorders: Secondary | ICD-10-CM | POA: Diagnosis not present

## 2020-05-24 DIAGNOSIS — I959 Hypotension, unspecified: Secondary | ICD-10-CM | POA: Diagnosis not present

## 2020-05-24 DIAGNOSIS — E039 Hypothyroidism, unspecified: Secondary | ICD-10-CM | POA: Diagnosis not present

## 2020-05-24 DIAGNOSIS — Z794 Long term (current) use of insulin: Secondary | ICD-10-CM

## 2020-05-24 LAB — BAYER DCA HB A1C WAIVED: HB A1C (BAYER DCA - WAIVED): 8.4 % — ABNORMAL HIGH (ref <7.0)

## 2020-05-24 MED ORDER — ACCU-CHEK SOFTCLIX LANCETS MISC: 12 refills | Status: DC

## 2020-05-24 MED ORDER — LOSARTAN POTASSIUM 50 MG PO TABS: 50.0000 mg | ORAL_TABLET | Freq: Every day | ORAL | 3 refills | Status: DC

## 2020-05-24 MED ORDER — RYBELSUS 3 MG PO TABS: 3.0000 mg | ORAL_TABLET | Freq: Every day | ORAL | 1 refills | Status: DC

## 2020-05-24 NOTE — Progress Notes (Signed)
Subjective:    Patient ID: Jessica Torres, female    DOB: 04-26-1940, 80 y.o.   MRN: [id number]  Chief Complaint  Patient presents with  . Follow-up  . Diabetes   Pt presents to the office today to recheck DM. She would also like her thyroid tested today. Her BP is on the lower side today.  Diabetes She presents for her follow-up diabetic visit. She has type 2 diabetes mellitus. Her disease course has been stable. There are no hypoglycemic associated symptoms. Associated symptoms include foot paresthesias. Pertinent negatives for diabetes include no blurred vision. Symptoms are stable. Diabetic complications include peripheral neuropathy. Risk factors for coronary artery disease include dyslipidemia, diabetes mellitus, hypertension and sedentary lifestyle. She is following a generally unhealthy diet. Her overall blood glucose range is 180-200 mg/dl. An ACE inhibitor/angiotensin II receptor blocker is being taken. Eye exam is not current.  Thyroid Problem Presents for follow-up visit. Symptoms include constipation. Patient reports no depressed mood or hair loss. The symptoms have been stable.      Review of Systems  Eyes: Negative for blurred vision.  Gastrointestinal: Positive for constipation.  All other systems reviewed and are negative.      Objective:   Physical Exam Vitals reviewed.  Constitutional:      General: She is not in acute distress.    Appearance: She is well-developed.  HENT:     Head: Normocephalic and atraumatic.     Right Ear: Tympanic membrane normal.     Left Ear: Tympanic membrane normal.  Eyes:     Pupils: Pupils are equal, round, and reactive to light.  Neck:     Thyroid: No thyromegaly.  Cardiovascular:     Rate and Rhythm: Normal rate and regular rhythm.     Heart sounds: Normal heart sounds. No murmur heard.   Pulmonary:     Effort: Pulmonary effort is normal. No respiratory distress.     Breath sounds: Normal breath sounds. No wheezing.   Abdominal:     General: Bowel sounds are normal. There is no distension.     Palpations: Abdomen is soft.     Tenderness: There is no abdominal tenderness.  Musculoskeletal:        General: No tenderness. Normal range of motion.     Cervical back: Normal range of motion and neck supple.  Skin:    General: Skin is warm and dry.  Neurological:     Mental Status: She is alert and oriented to person, place, and time.     Cranial Nerves: No cranial nerve deficit.     Deep Tendon Reflexes: Reflexes are normal and symmetric.  Psychiatric:        Behavior: Behavior normal.        Thought Content: Thought content normal.        Judgment: Judgment normal.       BP (!) 103/58   Pulse 91   Temp 97.9 F (36.6 C) (Temporal)   Ht 5' 5"  (1.651 m)   Wt 146 lb 3.2 oz (66.3 kg)   BMI 24.33 kg/m      Assessment & Plan:  Jessica Torres comes in today with chief complaint of Follow-up and Diabetes   Diagnosis and orders addressed:  1. Type 2 diabetes mellitus with other circulatory complication, with long-term current use of insulin (HCC) Will add Rybelsus 3 mg  Will stop Humalog today, as she is only taking as needed. And some times not before meals.  Low  carb diet  - Accu-Chek Softclix Lancets lancets; Use as instructed to test BG up to 3 times daily  Dispense: 300 each; Refill: 12 - Semaglutide (RYBELSUS) 3 MG TABS; Take 3 mg by mouth daily.  Dispense: 90 tablet; Refill: 1 - Bayer DCA Hb A1c Waived - CMP14+EGFR  2. Hypothyroidism, unspecified type - CMP14+EGFR - TSH  3. Hypertension associated with diabetes ([place])  4. Hypotension, unspecified hypotension type Will stop HCTZ 12.5 mg  Continue Losartan 50 mg   Labs pending Health Maintenance reviewed Diet and exercise encouraged  Follow up plan: 1 month to recheck DM and HTN   [name], FNP

## 2020-05-24 NOTE — Patient Instructions (Signed)
Diabetes Mellitus and Nutrition, Adult When you have diabetes, or diabetes mellitus, it is very important to have healthy eating habits because your blood sugar (glucose) levels are greatly affected by what you eat and drink. Eating healthy foods in the right amounts, at about the same times every day, can help you:  Control your blood glucose.  Lower your risk of heart disease.  Improve your blood pressure.  Reach or maintain a healthy weight. What can affect my meal plan? Every person with diabetes is different, and each person has different needs for a meal plan. Your health care provider may recommend that you work with a dietitian to make a meal plan that is best for you. Your meal plan may vary depending on factors such as:  The calories you need.  The medicines you take.  Your weight.  Your blood glucose, blood pressure, and cholesterol levels.  Your activity level.  Other health conditions you have, such as heart or kidney disease. How do carbohydrates affect me? Carbohydrates, also called carbs, affect your blood glucose level more than any other type of food. Eating carbs naturally raises the amount of glucose in your blood. Carb counting is a method for keeping track of how many carbs you eat. Counting carbs is important to keep your blood glucose at a healthy level, especially if you use insulin or take certain oral diabetes medicines. It is important to know how many carbs you can safely have in each meal. This is different for every person. Your dietitian can help you calculate how many carbs you should have at each meal and for each snack. How does alcohol affect me? Alcohol can cause a sudden decrease in blood glucose (hypoglycemia), especially if you use insulin or take certain oral diabetes medicines. Hypoglycemia can be a life-threatening condition. Symptoms of hypoglycemia, such as sleepiness, dizziness, and confusion, are similar to symptoms of having too much  alcohol.  Do not drink alcohol if: ? Your health care provider tells you not to drink. ? You are pregnant, may be pregnant, or are planning to become pregnant.  If you drink alcohol: ? Do not drink on an empty stomach. ? Limit how much you use to:  0-1 drink a day for women.  0-2 drinks a day for men. ? Be aware of how much alcohol is in your drink. In the [place] one drink equals one 12 oz bottle of beer (355 mL), one 5 oz glass of wine (148 mL), or one 1 oz glass of hard liquor (44 mL). ? Keep yourself hydrated with water, diet soda, or unsweetened iced tea.  Keep in mind that regular soda, juice, and other mixers may contain a lot of sugar and must be counted as carbs. What are tips for following this plan? Reading food labels  Start by checking the serving size on the "Nutrition Facts" label of packaged foods and drinks. The amount of calories, carbs, fats, and other nutrients listed on the label is based on one serving of the item. Many items contain more than one serving per package.  Check the total grams (g) of carbs in one serving. You can calculate the number of servings of carbs in one serving by dividing the total carbs by 15. For example, if a food has 30 g of total carbs per serving, it would be equal to 2 servings of carbs.  Check the number of grams (g) of saturated fats and trans fats in one serving. Choose foods that have   a low amount or none of these fats.  Check the number of milligrams (mg) of salt (sodium) in one serving. Most people should limit total sodium intake to less than 2,300 mg per day.  Always check the nutrition information of foods labeled as "low-fat" or "nonfat." These foods may be higher in added sugar or refined carbs and should be avoided.  Talk to your dietitian to identify your daily goals for nutrients listed on the label. Shopping  Avoid buying canned, pre-made, or processed foods. These foods tend to be high in fat, sodium, and added  sugar.  Shop around the outside edge of the grocery store. This is where you will most often find fresh fruits and vegetables, bulk grains, fresh meats, and fresh dairy. Cooking  Use low-heat cooking methods, such as baking, instead of high-heat cooking methods like deep frying.  Cook using healthy oils, such as olive, canola, or sunflower oil.  Avoid cooking with butter, cream, or high-fat meats. Meal planning  Eat meals and snacks regularly, preferably at the same times every day. Avoid going long periods of time without eating.  Eat foods that are high in fiber, such as fresh fruits, vegetables, beans, and whole grains. Talk with your dietitian about how many servings of carbs you can eat at each meal.  Eat 4-6 oz (112-168 g) of lean protein each day, such as lean meat, chicken, fish, eggs, or tofu. One ounce (oz) of lean protein is equal to: ? 1 oz (28 g) of meat, chicken, or fish. ? 1 egg. ?  cup (62 g) of tofu.  Eat some foods each day that contain healthy fats, such as avocado, nuts, seeds, and fish.   What foods should I eat? Fruits Berries. Apples. Oranges. Peaches. Apricots. Plums. Grapes. Mango. Papaya. Pomegranate. Kiwi. Cherries. Vegetables Lettuce. Spinach. Leafy greens, including kale, chard, collard greens, and mustard greens. Beets. Cauliflower. Cabbage. Broccoli. Carrots. Green beans. Tomatoes. Peppers. Onions. Cucumbers. Brussels sprouts. Grains Whole grains, such as whole-wheat or whole-grain bread, crackers, tortillas, cereal, and pasta. Unsweetened oatmeal. Quinoa. Brown or wild rice. Meats and other proteins Seafood. Poultry without skin. Lean cuts of poultry and beef. Tofu. Nuts. Seeds. Dairy Low-fat or fat-free dairy products such as milk, yogurt, and cheese. The items listed above may not be a complete list of foods and beverages you can eat. Contact a dietitian for more information. What foods should I avoid? Fruits Fruits canned with  syrup. Vegetables Canned vegetables. Frozen vegetables with butter or cream sauce. Grains Refined white flour and flour products such as bread, pasta, snack foods, and cereals. Avoid all processed foods. Meats and other proteins Fatty cuts of meat. Poultry with skin. Breaded or fried meats. Processed meat. Avoid saturated fats. Dairy Full-fat yogurt, cheese, or milk. Beverages Sweetened drinks, such as soda or iced tea. The items listed above may not be a complete list of foods and beverages you should avoid. Contact a dietitian for more information. Questions to ask a health care provider  Do I need to meet with a diabetes educator?  Do I need to meet with a dietitian?  What number can I call if I have questions?  When are the best times to check my blood glucose? Where to find more information:  American Diabetes Association: diabetes.org  Academy of Nutrition and Dietetics: www.eatright.org  National Institute of Diabetes and Digestive and Kidney Diseases: www.niddk.nih.gov  Association of Diabetes Care and Education Specialists: www.diabeteseducator.org Summary  It is important to have healthy eating   habits because your blood sugar (glucose) levels are greatly affected by what you eat and drink.  A healthy meal plan will help you control your blood glucose and maintain a healthy lifestyle.  Your health care provider may recommend that you work with a dietitian to make a meal plan that is best for you.  Keep in mind that carbohydrates (carbs) and alcohol have immediate effects on your blood glucose levels. It is important to count carbs and to use alcohol carefully. This information is not intended to replace advice given to you by your health care provider. Make sure you discuss any questions you have with your health care provider. Document Revised: 02/01/2019 Document Reviewed: 02/01/2019 Elsevier Patient Education  2021 Elsevier Inc.  

## 2020-05-25 ENCOUNTER — Other Ambulatory Visit: Payer: Self-pay | Admitting: Family

## 2020-05-25 DIAGNOSIS — N184 Chronic kidney disease, stage 4 (severe): Secondary | ICD-10-CM

## 2020-05-25 LAB — CMP14+EGFR
ALT: 21 IU/L (ref 0–32)
AST: 33 IU/L (ref 0–40)
Albumin/Globulin Ratio: 1.6 (ref 1.2–2.2)
Albumin: 4.2 g/dL (ref 3.7–4.7)
Alkaline Phosphatase: 72 IU/L (ref 44–121)
BUN/Creatinine Ratio: 13 (ref 12–28)
BUN: 25 mg/dL (ref 8–27)
Bilirubin Total: 0.4 mg/dL (ref 0.0–1.2)
CO2: 20 mmol/L (ref 20–29)
Calcium: 10.1 mg/dL (ref 8.7–10.3)
Chloride: 97 mmol/L (ref 96–106)
Creatinine, Ser: 1.87 mg/dL — ABNORMAL HIGH (ref 0.57–1.00)
Globulin, Total: 2.7 g/dL (ref 1.5–4.5)
Glucose: 275 mg/dL — ABNORMAL HIGH (ref 65–99)
Potassium: 5.1 mmol/L (ref 3.5–5.2)
Sodium: 133 mmol/L — ABNORMAL LOW (ref 134–144)
Total Protein: 6.9 g/dL (ref 6.0–8.5)
eGFR: 27 mL/min/{1.73_m2} — ABNORMAL LOW (ref >59)

## 2020-05-25 LAB — TSH: TSH: 1.44 u[IU]/mL (ref 0.450–4.500)

## 2020-05-28 ENCOUNTER — Telehealth: Payer: Self-pay

## 2020-05-28 DIAGNOSIS — Z794 Long term (current) use of insulin: Secondary | ICD-10-CM

## 2020-05-28 DIAGNOSIS — E1159 Type 2 diabetes mellitus with other circulatory complications: Secondary | ICD-10-CM

## 2020-05-28 NOTE — Telephone Encounter (Signed)
Patient mail box is full can not leave message

## 2020-05-28 NOTE — Telephone Encounter (Signed)
Pt called stating that someone from [facility] called her to let her know that there was an issue with the new diabetic medication that Alyse Low called in for her and needs Alyse Low to call them about it..  Phone # [phone]

## 2020-05-30 NOTE — Telephone Encounter (Signed)
Fax from [facility] Re: Rybelsus 3 mg daily Message: only receommended for the first 30 days of initial therapy & not effective for glycemic control. Please consider increasing the dose after 30 days to achieve glycemic control

## 2020-05-31 MED ORDER — RYBELSUS 7 MG PO TABS: 7.0000 mg | ORAL_TABLET | Freq: Every day | ORAL | 1 refills | Status: DC

## 2020-05-31 MED ORDER — RYBELSUS 3 MG PO TABS: 3.0000 mg | ORAL_TABLET | Freq: Every day | ORAL | 0 refills | Status: DC

## 2020-05-31 NOTE — Telephone Encounter (Signed)
After 1 month will increase Rybelsus 7 mg daily.

## 2020-06-05 ENCOUNTER — Telehealth: Payer: Medicare HMO | Admitting: *Deleted

## 2020-06-05 ENCOUNTER — Telehealth: Payer: Self-pay | Admitting: *Deleted

## 2020-06-05 NOTE — Telephone Encounter (Signed)
  Chronic Care Management   Outreach Note  06/05/2020 Name: Jessica Torres MRN: [id number] DOB: 10-25-1940  Referred by: Sharion Balloon, FNP Reason for referral : Chronic Care Management (Unsuccessful telephone follow-up)   An unsuccessful telephone outreach was attempted today. The patient was referred to the case management team for assistance with care management and care coordination.   Follow Up Plan: A HIPAA compliant phone message was left for the patient providing contact information and requesting a return call.  Forwarding to Care Guides for rescheduling  Chong Sicilian, BSN, RN-BC [place] / [place] Management Direct Dial: [phone]

## 2020-06-05 NOTE — Telephone Encounter (Signed)
Please reschedule f/u with RN CM [facility]  

## 2020-06-06 ENCOUNTER — Telehealth: Payer: Self-pay | Admitting: *Deleted

## 2020-06-06 NOTE — Telephone Encounter (Signed)
Patient has been rescheduled for 06/27/2020   Jessica Torres 

## 2020-06-06 NOTE — Chronic Care Management (AMB) (Signed)
  Care Management   Note  06/06/2020 Name: Jessica Torres MRN: [id number] DOB: 10/26/1940  Jessica Torres is a 80 y.o. year old female who is a primary care patient of Sharion Balloon, FNP and is actively engaged with the care management team. I reached out to [name] by phone today to assist with re-scheduling a follow up visit with the RN Case Manager  Follow up plan: Telephone appointment with care management team member scheduled for: 4/20/ 2022  [place] Management

## 2020-06-13 ENCOUNTER — Other Ambulatory Visit: Payer: Self-pay | Admitting: Family

## 2020-06-13 DIAGNOSIS — E7800 Pure hypercholesterolemia, unspecified: Secondary | ICD-10-CM

## 2020-06-14 ENCOUNTER — Other Ambulatory Visit (HOSPITAL_COMMUNITY): Payer: Self-pay | Admitting: Nephrology

## 2020-06-14 ENCOUNTER — Other Ambulatory Visit: Payer: Self-pay | Admitting: Nephrology

## 2020-06-14 DIAGNOSIS — E1122 Type 2 diabetes mellitus with diabetic chronic kidney disease: Secondary | ICD-10-CM

## 2020-06-14 DIAGNOSIS — Z79899 Other long term (current) drug therapy: Secondary | ICD-10-CM | POA: Diagnosis not present

## 2020-06-14 DIAGNOSIS — I129 Hypertensive chronic kidney disease with stage 1 through stage 4 chronic kidney disease, or unspecified chronic kidney disease: Secondary | ICD-10-CM

## 2020-06-14 DIAGNOSIS — E559 Vitamin D deficiency, unspecified: Secondary | ICD-10-CM | POA: Diagnosis not present

## 2020-06-14 DIAGNOSIS — R809 Proteinuria, unspecified: Secondary | ICD-10-CM | POA: Diagnosis not present

## 2020-06-14 DIAGNOSIS — N189 Chronic kidney disease, unspecified: Secondary | ICD-10-CM | POA: Diagnosis not present

## 2020-06-14 DIAGNOSIS — E1129 Type 2 diabetes mellitus with other diabetic kidney complication: Secondary | ICD-10-CM | POA: Diagnosis not present

## 2020-06-14 DIAGNOSIS — E871 Hypo-osmolality and hyponatremia: Secondary | ICD-10-CM | POA: Diagnosis not present

## 2020-06-20 ENCOUNTER — Encounter (HOSPITAL_COMMUNITY): Payer: Self-pay

## 2020-06-20 ENCOUNTER — Ambulatory Visit (HOSPITAL_COMMUNITY): Payer: Medicare HMO

## 2020-06-25 ENCOUNTER — Encounter: Payer: Self-pay | Admitting: Family

## 2020-06-25 ENCOUNTER — Ambulatory Visit (INDEPENDENT_AMBULATORY_CARE_PROVIDER_SITE_OTHER): Payer: Medicare HMO | Admitting: Family

## 2020-06-25 ENCOUNTER — Other Ambulatory Visit: Payer: Self-pay

## 2020-06-25 VITALS — BP 146/63 | HR 80 | Temp 97.1°F | Ht 65.0 in | Wt 149.0 lb

## 2020-06-25 DIAGNOSIS — Z794 Long term (current) use of insulin: Secondary | ICD-10-CM

## 2020-06-25 DIAGNOSIS — N184 Chronic kidney disease, stage 4 (severe): Secondary | ICD-10-CM | POA: Diagnosis not present

## 2020-06-25 DIAGNOSIS — E1159 Type 2 diabetes mellitus with other circulatory complications: Secondary | ICD-10-CM

## 2020-06-25 DIAGNOSIS — I152 Hypertension secondary to endocrine disorders: Secondary | ICD-10-CM | POA: Diagnosis not present

## 2020-06-25 DIAGNOSIS — E1122 Type 2 diabetes mellitus with diabetic chronic kidney disease: Secondary | ICD-10-CM | POA: Diagnosis not present

## 2020-06-25 NOTE — Progress Notes (Signed)
Subjective:    Patient ID: Jessica Torres, female    DOB: 1940/12/07, 80 y.o.   MRN: [id number]  Chief Complaint  Patient presents with  . Diabetes  . Hypertension   PT presents to the office today to recheck DM and hypertension. She saw her nephrologists on 06/14/20 for CKD. She is scheduled for a renal US and lab work pending to have drawn.  Diabetes She presents for her follow-up diabetic visit. She has type 2 diabetes mellitus. There are no hypoglycemic associated symptoms. Associated symptoms include blurred vision. Pertinent negatives for diabetes include no foot paresthesias. Symptoms are stable. Diabetic complications include nephropathy. Risk factors for coronary artery disease include dyslipidemia, diabetes mellitus, hypertension, sedentary lifestyle and post-menopausal. She is following a generally unhealthy diet. Her overall blood glucose range is 130-140 mg/dl.  Hypertension This is a chronic problem. The current episode started more than 1 year ago. The problem has been waxing and waning since onset. The problem is uncontrolled. Associated symptoms include blurred vision and malaise/fatigue. Pertinent negatives include no peripheral edema or shortness of breath. Risk factors for coronary artery disease include dyslipidemia, obesity and sedentary lifestyle. The current treatment provides moderate improvement.      Review of Systems  Constitutional: Positive for malaise/fatigue.  Eyes: Positive for blurred vision.  Respiratory: Negative for shortness of breath.   All other systems reviewed and are negative.      Objective:   Physical Exam Vitals reviewed.  Constitutional:      General: She is not in acute distress.    Appearance: She is well-developed.  HENT:     Head: Normocephalic and atraumatic.     Right Ear: Tympanic membrane normal.     Left Ear: Tympanic membrane normal.  Eyes:     Pupils: Pupils are equal, round, and reactive to light.  Neck:     Thyroid:  No thyromegaly.  Cardiovascular:     Rate and Rhythm: Normal rate and regular rhythm.     Heart sounds: Normal heart sounds. No murmur heard.   Pulmonary:     Effort: Pulmonary effort is normal. No respiratory distress.     Breath sounds: Normal breath sounds. No wheezing.  Abdominal:     General: Bowel sounds are normal. There is no distension.     Palpations: Abdomen is soft.     Tenderness: There is no abdominal tenderness.  Musculoskeletal:        General: No tenderness. Normal range of motion.     Cervical back: Normal range of motion and neck supple.  Skin:    General: Skin is warm and dry.  Neurological:     Mental Status: She is alert and oriented to person, place, and time.     Cranial Nerves: No cranial nerve deficit.     Deep Tendon Reflexes: Reflexes are normal and symmetric.  Psychiatric:        Behavior: Behavior normal.        Thought Content: Thought content normal.        Judgment: Judgment normal.       BP (!) 146/63   Pulse 80   Temp (!) 97.1 F (36.2 C) (Temporal)   Ht 5\' 5"  (1.651 m)   Wt 149 lb (67.6 kg)   BMI 24.79 kg/m      Assessment & Plan:  Jessica Torres comes in today with chief complaint of Diabetes and Hypertension   Diagnosis and orders addressed:  1. Hypertension associated with diabetes (  [place]) Not at goal at this time.  Has [place] of renal scheduled this week Will defer to them since she has follow up and has changed medications.   2. Type 2 diabetes mellitus with other circulatory complication, with long-term current use of insulin (HCC) Low carb diet Continue Toujeo, discussed if glucose continues to be elevated will need to increase to 52-55 units a day from 48 units.  Strict low carb  3. Stage 4 chronic kidney disease [facility]) Keep nephrologist appt    Will hold off on labs at this time since she is getting them drawn by nephrologists.  Health Maintenance reviewed Diet and exercise encouraged  Follow up plan: 2 months     [name], FNP

## 2020-06-25 NOTE — Patient Instructions (Signed)
Diabetes Mellitus and Nutrition, Adult When you have diabetes, or diabetes mellitus, it is very important to have healthy eating habits because your blood sugar (glucose) levels are greatly affected by what you eat and drink. Eating healthy foods in the right amounts, at about the same times every day, can help you:  Control your blood glucose.  Lower your risk of heart disease.  Improve your blood pressure.  Reach or maintain a healthy weight. What can affect my meal plan? Every person with diabetes is different, and each person has different needs for a meal plan. Your health care provider may recommend that you work with a dietitian to make a meal plan that is best for you. Your meal plan may vary depending on factors such as:  The calories you need.  The medicines you take.  Your weight.  Your blood glucose, blood pressure, and cholesterol levels.  Your activity level.  Other health conditions you have, such as heart or kidney disease. How do carbohydrates affect me? Carbohydrates, also called carbs, affect your blood glucose level more than any other type of food. Eating carbs naturally raises the amount of glucose in your blood. Carb counting is a method for keeping track of how many carbs you eat. Counting carbs is important to keep your blood glucose at a healthy level, especially if you use insulin or take certain oral diabetes medicines. It is important to know how many carbs you can safely have in each meal. This is different for every person. Your dietitian can help you calculate how many carbs you should have at each meal and for each snack. How does alcohol affect me? Alcohol can cause a sudden decrease in blood glucose (hypoglycemia), especially if you use insulin or take certain oral diabetes medicines. Hypoglycemia can be a life-threatening condition. Symptoms of hypoglycemia, such as sleepiness, dizziness, and confusion, are similar to symptoms of having too much  alcohol.  Do not drink alcohol if: ? Your health care provider tells you not to drink. ? You are pregnant, may be pregnant, or are planning to become pregnant.  If you drink alcohol: ? Do not drink on an empty stomach. ? Limit how much you use to:  0-1 drink a day for women.  0-2 drinks a day for men. ? Be aware of how much alcohol is in your drink. In the [place] one drink equals one 12 oz bottle of beer (355 mL), one 5 oz glass of wine (148 mL), or one 1 oz glass of hard liquor (44 mL). ? Keep yourself hydrated with water, diet soda, or unsweetened iced tea.  Keep in mind that regular soda, juice, and other mixers may contain a lot of sugar and must be counted as carbs. What are tips for following this plan? Reading food labels  Start by checking the serving size on the "Nutrition Facts" label of packaged foods and drinks. The amount of calories, carbs, fats, and other nutrients listed on the label is based on one serving of the item. Many items contain more than one serving per package.  Check the total grams (g) of carbs in one serving. You can calculate the number of servings of carbs in one serving by dividing the total carbs by 15. For example, if a food has 30 g of total carbs per serving, it would be equal to 2 servings of carbs.  Check the number of grams (g) of saturated fats and trans fats in one serving. Choose foods that have   a low amount or none of these fats.  Check the number of milligrams (mg) of salt (sodium) in one serving. Most people should limit total sodium intake to less than 2,300 mg per day.  Always check the nutrition information of foods labeled as "low-fat" or "nonfat." These foods may be higher in added sugar or refined carbs and should be avoided.  Talk to your dietitian to identify your daily goals for nutrients listed on the label. Shopping  Avoid buying canned, pre-made, or processed foods. These foods tend to be high in fat, sodium, and added  sugar.  Shop around the outside edge of the grocery store. This is where you will most often find fresh fruits and vegetables, bulk grains, fresh meats, and fresh dairy. Cooking  Use low-heat cooking methods, such as baking, instead of high-heat cooking methods like deep frying.  Cook using healthy oils, such as olive, canola, or sunflower oil.  Avoid cooking with butter, cream, or high-fat meats. Meal planning  Eat meals and snacks regularly, preferably at the same times every day. Avoid going long periods of time without eating.  Eat foods that are high in fiber, such as fresh fruits, vegetables, beans, and whole grains. Talk with your dietitian about how many servings of carbs you can eat at each meal.  Eat 4-6 oz (112-168 g) of lean protein each day, such as lean meat, chicken, fish, eggs, or tofu. One ounce (oz) of lean protein is equal to: ? 1 oz (28 g) of meat, chicken, or fish. ? 1 egg. ?  cup (62 g) of tofu.  Eat some foods each day that contain healthy fats, such as avocado, nuts, seeds, and fish.   What foods should I eat? Fruits Berries. Apples. Oranges. Peaches. Apricots. Plums. Grapes. Mango. Papaya. Pomegranate. Kiwi. Cherries. Vegetables Lettuce. Spinach. Leafy greens, including kale, chard, collard greens, and mustard greens. Beets. Cauliflower. Cabbage. Broccoli. Carrots. Green beans. Tomatoes. Peppers. Onions. Cucumbers. Brussels sprouts. Grains Whole grains, such as whole-wheat or whole-grain bread, crackers, tortillas, cereal, and pasta. Unsweetened oatmeal. Quinoa. Brown or wild rice. Meats and other proteins Seafood. Poultry without skin. Lean cuts of poultry and beef. Tofu. Nuts. Seeds. Dairy Low-fat or fat-free dairy products such as milk, yogurt, and cheese. The items listed above may not be a complete list of foods and beverages you can eat. Contact a dietitian for more information. What foods should I avoid? Fruits Fruits canned with  syrup. Vegetables Canned vegetables. Frozen vegetables with butter or cream sauce. Grains Refined white flour and flour products such as bread, pasta, snack foods, and cereals. Avoid all processed foods. Meats and other proteins Fatty cuts of meat. Poultry with skin. Breaded or fried meats. Processed meat. Avoid saturated fats. Dairy Full-fat yogurt, cheese, or milk. Beverages Sweetened drinks, such as soda or iced tea. The items listed above may not be a complete list of foods and beverages you should avoid. Contact a dietitian for more information. Questions to ask a health care provider  Do I need to meet with a diabetes educator?  Do I need to meet with a dietitian?  What number can I call if I have questions?  When are the best times to check my blood glucose? Where to find more information:  American Diabetes Association: diabetes.org  Academy of Nutrition and Dietetics: www.eatright.org  National Institute of Diabetes and Digestive and Kidney Diseases: www.niddk.nih.gov  Association of Diabetes Care and Education Specialists: www.diabeteseducator.org Summary  It is important to have healthy eating   habits because your blood sugar (glucose) levels are greatly affected by what you eat and drink.  A healthy meal plan will help you control your blood glucose and maintain a healthy lifestyle.  Your health care provider may recommend that you work with a dietitian to make a meal plan that is best for you.  Keep in mind that carbohydrates (carbs) and alcohol have immediate effects on your blood glucose levels. It is important to count carbs and to use alcohol carefully. This information is not intended to replace advice given to you by your health care provider. Make sure you discuss any questions you have with your health care provider. Document Revised: 02/01/2019 Document Reviewed: 02/01/2019 Elsevier Patient Education  2021 Elsevier Inc.  

## 2020-06-26 ENCOUNTER — Ambulatory Visit (HOSPITAL_COMMUNITY)
Admission: RE | Admit: 2020-06-26 | Discharge: 2020-06-26 | Disposition: A | Discharge status: Home / Self Care | Payer: Medicare HMO | Source: Ambulatory Visit | Attending: Nephrology | Admitting: Nephrology

## 2020-06-26 DIAGNOSIS — I129 Hypertensive chronic kidney disease with stage 1 through stage 4 chronic kidney disease, or unspecified chronic kidney disease: Secondary | ICD-10-CM | POA: Insufficient documentation

## 2020-06-26 DIAGNOSIS — E559 Vitamin D deficiency, unspecified: Secondary | ICD-10-CM | POA: Diagnosis not present

## 2020-06-26 DIAGNOSIS — R809 Proteinuria, unspecified: Secondary | ICD-10-CM | POA: Diagnosis not present

## 2020-06-26 DIAGNOSIS — N189 Chronic kidney disease, unspecified: Secondary | ICD-10-CM | POA: Diagnosis not present

## 2020-06-26 DIAGNOSIS — E1129 Type 2 diabetes mellitus with other diabetic kidney complication: Secondary | ICD-10-CM | POA: Diagnosis not present

## 2020-06-26 DIAGNOSIS — E1122 Type 2 diabetes mellitus with diabetic chronic kidney disease: Secondary | ICD-10-CM | POA: Insufficient documentation

## 2020-06-26 DIAGNOSIS — Z79899 Other long term (current) drug therapy: Secondary | ICD-10-CM | POA: Diagnosis not present

## 2020-06-26 DIAGNOSIS — Z0389 Encounter for observation for other suspected diseases and conditions ruled out: Secondary | ICD-10-CM | POA: Diagnosis not present

## 2020-06-26 DIAGNOSIS — E871 Hypo-osmolality and hyponatremia: Secondary | ICD-10-CM | POA: Diagnosis not present

## 2020-06-27 ENCOUNTER — Ambulatory Visit (INDEPENDENT_AMBULATORY_CARE_PROVIDER_SITE_OTHER): Payer: Medicare HMO | Admitting: *Deleted

## 2020-06-27 DIAGNOSIS — N184 Chronic kidney disease, stage 4 (severe): Secondary | ICD-10-CM

## 2020-06-27 DIAGNOSIS — E1159 Type 2 diabetes mellitus with other circulatory complications: Secondary | ICD-10-CM

## 2020-06-27 DIAGNOSIS — Z794 Long term (current) use of insulin: Secondary | ICD-10-CM

## 2020-06-29 ENCOUNTER — Ambulatory Visit: Payer: Medicare HMO | Admitting: *Deleted

## 2020-06-29 DIAGNOSIS — E1159 Type 2 diabetes mellitus with other circulatory complications: Secondary | ICD-10-CM | POA: Diagnosis not present

## 2020-06-29 DIAGNOSIS — N184 Chronic kidney disease, stage 4 (severe): Secondary | ICD-10-CM

## 2020-06-29 DIAGNOSIS — Z794 Long term (current) use of insulin: Secondary | ICD-10-CM | POA: Diagnosis not present

## 2020-07-05 ENCOUNTER — Ambulatory Visit (INDEPENDENT_AMBULATORY_CARE_PROVIDER_SITE_OTHER): Payer: Medicare HMO | Admitting: Pharmacist

## 2020-07-05 ENCOUNTER — Other Ambulatory Visit: Payer: Self-pay

## 2020-07-05 DIAGNOSIS — G720 Drug-induced myopathy: Secondary | ICD-10-CM

## 2020-07-05 DIAGNOSIS — E119 Type 2 diabetes mellitus without complications: Secondary | ICD-10-CM | POA: Diagnosis not present

## 2020-07-05 MED ORDER — NOVOLOG FLEXPEN 100 UNIT/ML ~~LOC~~ SOPN: 10.0000 [IU] | PEN_INJECTOR | Freq: Three times a day (TID) | SUBCUTANEOUS | 11 refills | Status: DC

## 2020-07-05 MED ORDER — DAPAGLIFLOZIN PROPANEDIOL 10 MG PO TABS: 10.0000 mg | ORAL_TABLET | Freq: Every day | ORAL | 0 refills | Status: DC

## 2020-07-05 NOTE — Progress Notes (Signed)
    07/05/2020 Name: Jessica Torres MRN: [id number] DOB: [birth year]  S:79 yoF presents for diabetes evaluation, education, and management Patient was referred and last seen by Primary Care Provider on4/18/22.  Insurance coverage/medication affordability:[employer] with medications.  Current diabetes medications include:toujeo, jardiance  Metformin discontinued due to renal function  Current hypertension medications include:losartan/hctz Goal 130/80  Current hyperlipidemia medications include:zetia, fenofibrate  STATIN ALLERGY/INTOLERANCE DOCUMENTED IN EMR--> yes, ICD10: G72     O:  Lab Results  Component Value Date   HGBA1C 8.4 (H) 05/24/2020    Lipid Panel     Component Value Date/Time   CHOL 186 07/13/2019 1037   TRIG 188 (H) 07/13/2019 1037   HDL 45 07/13/2019 1037   CHOLHDL 4.1 07/13/2019 1037   LDLCALC 108 (H) 07/13/2019 1037    Home fasting blood sugars: 200-260  2 hour post-meal/random blood sugars: n/a.   A/P:   Diabetes T2DM currently uncontrolled.  Patient was unable to take trulicity due to GI distress. Control is suboptimal due to diet/lifestyle    BASAL INSULIN (TOUJEO) INCREASED TO 50-55 UNITS   Restarted humalog 10-20 units with meals (SENT RX TO [facility])  SWITCHED JARDIANCE TO FARXIGA 10MG  DAILY PER RENAL FUNCTION AND FOR CKD PROTECTION  DISCONTINUE JARDIANCE  PATIENT DOES NOT WISH TO TAKE RYBELSUS   -Extensively discussed pathophysiology of diabetes, recommended lifestyle interventions, dietary effects on blood sugar control  -Counseled on s/sx of and management of hypoglycemia  -Next A1C anticipated 3 MONTHS.  Written patient instructions provided.  Total time in face to face counseling 30 minutes.   Follow up Pharmacist Clinic Visit IN 3 WEEKS.   Regina Eck, PharmD, BCPS Clinical Pharmacist, [place]  II Phone [phone]

## 2020-07-06 NOTE — Patient Instructions (Signed)
Visit Information  PATIENT GOALS: Goals Addressed            This Visit's Progress   . Follow My Treatment Plan-Chronic Kidney       Timeframe:  Long-Range Goal Priority:  Medium Start Date:   06/27/20                          Expected End Date:  06/27/21                     Follow Up Date 07/27/20   . Patient will self administer medications as prescribed . Patient will attend all scheduled provider appointments . Patient will continue to perform ADL's independently . Patient will continue to perform IADL's independently . Patient will call provider office for new concerns or questions . Review CKD dietary recommendations . Keep appointment with Clinical Pharmacist on 07/05/20 . Call RN Care Manager as needed [phone]   Why is this important?    Staying as healthy as you can is very important. This may mean making changes if you smoke, don't exercise or eat poorly.   A healthy lifestyle is an important goal for you.   Following the treatment plan and making changes may be hard.   Try some of these steps to help keep the disease from getting worse.     Notes:     Marland Kitchen Monitor and Manage My Blood Sugar-Diabetes Type 2       Timeframe:  Long-Range Goal Priority:  High Start Date:   05/22/20                          Expected End Date: 03/09/21                       Follow Up Date 06/05/20    . Check blood sugar before each meal and at bedtime . Write blood sugar in log . Take medications as instructed . Eat a low carb diet at regular intervals to keep blood sugar stable . Keep appointment with clinical pharmacist on 07/05/20 . Call PCP [phone] if blood sugar is below 70 or above 250 . Call [facility] as needed [phone]   Why is this important?    Checking your blood sugar at home helps to keep it from getting very high or very low.   Writing the results in a diary or log helps the doctor know how to care for you.   Your blood sugar log should have the time, date  and the results.   Also, write down the amount of insulin or other medicine that you take.   Other information, like what you ate, exercise done and how you were feeling, will also be helpful.     Notes:        The patient verbalized understanding of instructions, educational materials, and care plan provided today and declined offer to receive copy of patient instructions, educational materials, and care plan.   Follow Up Plan:  . Telephone follow up appointment with care management team member scheduled for: 07/27/20 with RNCM . The patient has been provided with contact information for the care management team and has been advised to call with any health related questions or concerns.  . Next PharmD appointment is scheduled for 07/05/20 . Next PCP appointment scheduled for: 08/27/20 . Next nephrology appointment scheduled for: 07/19/20 with Dr Theador Hawthorne .  Next cardiology appointment is scheduled for 07/25/20 with Dr Gwenlyn Found  Chong Sicilian, BSN, RN-BC [place] [facility] [place] [facility] Direct Dial: [phone]

## 2020-07-06 NOTE — Patient Instructions (Signed)
Visit Information  PATIENT GOALS: Goals Addressed            This Visit's Progress   . Monitor and Manage My Blood Sugar-Diabetes Type 2   On track    Timeframe:  Long-Range Goal Priority:  High Start Date:   05/22/20                          Expected End Date: 03/09/21                       Follow Up Date 07/27/20   . Check blood sugar before each meal and at bedtime . Write blood sugar in log . Take medications as instructed . Eat a low carb diet at regular intervals to keep blood sugar stable . Keep appointment with clinical pharmacist on 07/05/20 . Call PCP [phone] if blood sugar is below 70 or above 250 . Call [facility] as needed [phone]   Why is this important?    Checking your blood sugar at home helps to keep it from getting very high or very low.   Writing the results in a diary or log helps the doctor know how to care for you.   Your blood sugar log should have the time, date and the results.   Also, write down the amount of insulin or other medicine that you take.   Other information, like what you ate, exercise done and how you were feeling, will also be helpful.     Notes:        The patient verbalized understanding of instructions, educational materials, and care plan provided today and declined offer to receive copy of patient instructions, educational materials, and care plan.    Follow Up Plan:  . Telephone follow up appointment with care management team member scheduled for: 07/27/20 with RNCM . The patient has been provided with contact information for the care management team and has been advised to call with any health related questions or concerns.  . Next PharmD appointment is scheduled for 07/05/20 . Next PCP appointment scheduled for: 08/27/20 . Next nephrology appointment scheduled for: 07/19/20 with Dr Theador Hawthorne . Next cardiology appointment is scheduled for 07/25/20 with Dr Gwenlyn Found  Chong Sicilian, BSN, RN-BC [place] [facility] [place] [facility] Direct Dial: [phone]

## 2020-07-06 NOTE — Chronic Care Management (AMB) (Signed)
Chronic Care Management   CCM RN Visit Note  06/27/2020 Name: ADRINNE Torres MRN: [id number] DOB: Sep 28, 1940  Subjective: Jessica Torres is a 80 y.o. year old female who is a primary care patient of Sharion Balloon, FNP. The care management team was consulted for assistance with disease management and care coordination needs.    Engaged with patient by telephone for follow up visit in response to provider referral for case management and/or care coordination services.   Consent to Services:  The patient was given information about Chronic Care Management services, agreed to services, and gave verbal consent prior to initiation of services.  Please see initial visit note for detailed documentation.   Patient agreed to services and verbal consent obtained.   Assessment: Review of patient past medical history, allergies, medications, health status, including review of consultants reports, laboratory and other test data, was performed as part of comprehensive evaluation and provision of chronic care management services.   SDOH (Social Determinants of Health) assessments and interventions performed:    CCM Care Plan  Allergies  Allergen Reactions  . Morphine And Related Swelling and Other (See Comments)    Facial swelling  . Latex Rash  . Statins Other (See Comments)    Myalgias     Outpatient Encounter Medications as of 06/27/2020  Medication Sig Note  . insulin lispro (HUMALOG) 100 UNIT/ML injection Inject 10-20 Units into the skin 3 (three) times daily with meals. 06/27/2020: Was discontinued on 05/24/20 but patient is still taking at bedtime PRN if blood sugar is "too high"  . ACCU-CHEK AVIVA PLUS test strip TEST BLOOD SUGAR FOUR TIMES DAILY   . Accu-Chek Softclix Lancets lancets Use as instructed to test BG up to 3 times daily   . Alcohol Swabs (B-D SINGLE USE SWABS REGULAR) PADS Test BS 4 times daily Dx E11.9   . BD PEN NEEDLE NANO 2ND GEN 32G X 4 MM MISC Inject 1 application  into the skin in the morning and at bedtime.   . clopidogrel (PLAVIX) 75 MG tablet Take 1 tablet (75 mg total) by mouth daily.   . empagliflozin (JARDIANCE) 25 MG TABS tablet Take 1 tablet (25 mg total) by mouth daily before breakfast. CANCEL JARDIANCE 10MG  PRESCRIPTION   . ergocalciferol (VITAMIN D2) 1.25 MG (50000 UT) capsule Take 1 capsule (50,000 Units total) by mouth once a week. No specific day   . ezetimibe (ZETIA) 10 MG tablet Take 1 tablet (10 mg total) by mouth daily.   . fenofibrate 160 MG tablet TAKE 1 TABLET EVERY DAY   . insulin glargine, 2 Unit Dial, (TOUJEO MAX SOLOSTAR) 300 UNIT/ML Solostar Pen Inject 48 Units into the skin daily.   Marland Kitchen levothyroxine (SYNTHROID) 88 MCG tablet Take 1 tablet (88 mcg total) by mouth daily before breakfast.   . losartan-hydrochlorothiazide (HYZAAR) 50-12.5 MG tablet Take by mouth.   . pantoprazole (PROTONIX) 40 MG tablet Take 1 tablet (40 mg total) by mouth daily.   . [DISCONTINUED] metFORMIN (GLUCOPHAGE) 1000 MG tablet Take 1 tablet (1,000 mg total) by mouth 2 (two) times daily with a meal. (Patient taking differently: Take 1,000 mg by mouth 2 (two) times daily with a meal. Only taking once a day)    No facility-administered encounter medications on file as of 06/27/2020.    Patient Active Problem List   Diagnosis Date Noted  . Stage 4 chronic kidney disease ([place]) 05/25/2020  . 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  . Chronic bilateral low back pain without sciatica  07/13/2019  . Carotid stenosis, right 12/10/2017  . Left hip pain 06/04/2017  . Claudication in peripheral vascular disease ([place]) 10/30/2016  . Vitamin D deficiency 05/07/2016  . Gastroesophageal reflux disease without esophagitis 01/28/2016  . Coronary artery disease due to lipid rich plaque 10/18/2015  . Hypothyroidism 10/18/2015  . Shortness of breath 09/15/2014  . S/P -[place] SFA PTA with restenosis  07/06/2014  . Hypertension associated with diabetes ([place]) 06/28/2014  .  Hyperlipidemia-statin intolerant 06/28/2014  . DM (diabetes mellitus) ([place]) 06/28/2014  . Peripheral arterial disease - Bilateral SFA  06/28/2014    Conditions to be addressed/monitored:DMII and CKD  Care Plan : RNCM: Diabetes Type 2 (Adult)    Problem: Glycemic Management (Diabetes, Type 2)   Priority: Medium    Long-Range Goal: Glycemic Management Optimized   Start Date: 05/22/2020  This Visit's Progress: Not on track  Recent Progress: Not on track  Priority: Medium  Note:   Current Barriers:  Marland Kitchen Knowledge Deficits related to when to check blood sugar and take insulin . Chronic Disease Management support and education needs related to self-management of diabetes in a patient with hypertension and hyperlipidemia . Knowledge deficit related to insulin dosage directions . Sore fingers from frequency of testing  Nurse Case Manager Clinical Goal(s):  . patient will verbalize understanding of plan for insulin dosing and scheduling . patient will work with PCP to address needs related to medical management of diabetes . patient will meet with RN Care Manager to address self-management of diabetes . Patient will meet with PharmD to address management of diabetes  Interventions:  . 1:1 collaboration with Sharion Balloon, FNP regarding development and update of comprehensive plan of care as evidenced by provider attestation and co-signature . Inter-disciplinary care team collaboration (see longitudinal plan of care) . Evaluation of current treatment plan related to diabetes and patient's adherence to plan as established by provider. . Chart reviewed including relevant office notes and lab results . Reviewed and discussed most recent A1C result and improvement over past 10 months . Reviewed and discussed medications o Medications are affordable at present but is coming up on doughnut hole due to high cost to insurance - Will assist with Jardiance prescription assistance through  [employer] - Can apply for assistance through [place] for insulin and other meds once hits doughnut hole o Compliant with toujeo and jardiance o Has some confusion about Humalog directions - Does not take it regularly at meals and will take at bedtime with toujeo if blood sugar is "too high" - Christy discontinued Humalog on 3/17 because she was only taking it PRN and not always with meals.She is still taking it as needed at bedtime with Toujeo despite education provided during out last telephone visit and despite Alyse Low having discontinued it.  . Verbal education provided on how Humalog and Toujeo work . Reminded patient that Humalog has been discontinued . Added humalog back to medication list as a patient reported medication since she is still taking it . Advised that if she is going to continue taking it, that it should be taken right before supper and after checking her blood sugar . Collaborated with PC . Discussed home blood sugar testing o Testing 2-3 times a day o Fasting is usually in low 100s o No readings below 70 recently o Provided verbal education that she is to check it before meals and at bedtime . Previously discussed Freestyle Libre 14 (CGM) day vs fingersticks o Provided  information on how it is used and benefits it provides o Patient is interested and would like more information o Printed materials will be left for patient to pickup at viist o Asked PCP to discuss Libre at visit and to send Rx to a Medicare diabetes DME supplier if they decide it is appropriate . Discussed diet o Encouraged low carb diet and to eat at regular intervals to maintain blood sugar . Provided with RNCM contact number and encouraged to reach out as needed . Collaborated with front office staff to schedule patient and appointment with Clinical Pharmacist for medication review and diabetes management assistance  Patient Goals/Self-Care Activities Over the  next 30 days, patient will: . Check blood sugar before each meal and at bedtime . Write blood sugar in log . Take medications as instructed . Eat a low carb diet at regular intervals to keep blood sugar stable . Keep appointment with clinical pharmacist on 07/05/20 . Call PCP [phone] if blood sugar is below 70 or above 250 . Call [facility] as needed [phone]   Care Plan : RNCM: Chronic Kidney (Adult)    Problem: Disease Progression   Priority: Medium    Long-Range Goal: Disease Progression Prevented or Minimized   Start Date: 06/27/2020  This Visit's Progress: On track  Priority: Medium  Note:   Objective: Lab Results  Component Value Date   CREATININE 1.87 (H) 05/24/2020   BUN 25 05/24/2020   NA 133 (L) 05/24/2020   K 5.1 05/24/2020   CL 97 05/24/2020   CO2 20 05/24/2020   Current Barriers:   Ineffective Self Health Maintenance   Does not contact provider office for questions/concerns  Confusion regarding some of her medications  Clinical Goal(s):  Marland Kitchen Collaboration with Sharion Balloon, FNP regarding development and update of comprehensive plan of care as evidenced by provider attestation and co-signature . Inter-disciplinary care team collaboration (see longitudinal plan of care)  patient will work with care management team to address care coordination and chronic disease management needs related to Chronic Kidney Disease in a patient with diabetes and hypertension.   Patient will work with nephrologist and PCP to address medical management of Chronic Kidney Disease    Interventions:   Evaluation of current treatment plan related to chronic kidney disease  Evaluation of self-management and patient's adherence to plan as established by provider.  Collaboration with Sharion Balloon, FNP regarding development and update of comprehensive plan of care as evidenced by provider attestation       and co-signature  Inter-disciplinary care team collaboration (see  longitudinal plan of care)  Discussed plans with patient for ongoing care management follow up and provided patient with direct contact information for care management team  Chart reviewed including relevant office notes, lab results, and imaging reports  Discussed recent visit with PCP and nephrologist as well as renal ultrasound  Reviewed and discussed lab results and imaging report  Encouraged patient to f/u with nephrologist regarding management of CKD  Reviewed and discussed medications, directions, and reason for use  Advised to avoid nephrotoxic medication or supplement use including NSAIDs (nonsteroidal anti-inflammatory drugs), radio-contrast media, oral phosphate-containing bowel preparations, herbal supplements, protein supplements.   Encouraged lifestyle changes including maintaining a healthy weight, exercise and activity, limiting alcohol consumption, smoking cessation to improve long-term outcomes.   Discussed diet and fluid intake  Will mail info on CKD diet  Therapeutic listening utilized. Acknowledged difficulty in making life-long behavior change.  Provided verbal education on need to  use pharmacologic therapy to prevent disease progression and manage complications or comorbidities, such as hypertension, cardiovascular disease, diabetes, anemia, bone disorders, peripheral vascular disease.   Collaborated with front office staff to schedule a follow-up with PharmD on 07/05/20  Provided with RNCM contact number and encouraged to reach out as needed  Encouraged patient to reach out to nephrologist with any questions or concerns regarding management or with any new or worsening symptoms  Self Care Activities:  . Performs ADL's independently . Performs IADL's independently  Patient Goals Over the next 60 days, patient will: . Patient will self administer medications as prescribed . Patient will attend all scheduled provider appointments . Patient will continue to  perform ADL's independently . Patient will continue to perform IADL's independently . Patient will call provider office for new concerns or questions . Review CKD dietary recommendations . Keep appointment with Clinical Pharmacist on 07/05/20 . Call RN Care Manager as needed [phone]  Follow Up Plan:  . Telephone follow up appointment with care management team member scheduled for: 07/27/20 with RNCM . The patient has been provided with contact information for the care management team and has been advised to call with any health related questions or concerns.  . Next PharmD appointment is scheduled for 07/05/20 . Next PCP appointment scheduled for: 08/27/20 . Next nephrology appointment scheduled for: 07/19/20 with Dr Theador Hawthorne . Next cardiology appointment is scheduled for 07/25/20 with Dr Gwenlyn Found     Follow Up Plan:  . Telephone follow up appointment with care management team member scheduled for: 07/27/20 with RNCM . The patient has been provided with contact information for the care management team and has been advised to call with any health related questions or concerns.  . Next PharmD appointment is scheduled for 07/05/20 . Next PCP appointment scheduled for: 08/27/20 . Next nephrology appointment scheduled for: 07/19/20 with Dr Theador Hawthorne . Next cardiology appointment is scheduled for 07/25/20 with Dr Gwenlyn Found  Chong Sicilian, BSN, RN-BC [place] [facility] [place] [facility] Direct Dial: [phone]

## 2020-07-06 NOTE — Chronic Care Management (AMB) (Signed)
Chronic Care Management   CCM RN Visit Note  06/29/2020 Name: Jessica Torres MRN: [id number] DOB: [birth year]  Subjective: Jessica Torres is a 80 y.o. year old female who is a primary care patient of Sharion Balloon, FNP. The care management team was consulted for assistance with disease management and care coordination needs.    Engaged with patient by telephone for follow up visit in response to provider referral for case management and/or care coordination services.   Consent to Services:  The patient was given information about Chronic Care Management services, agreed to services, and gave verbal consent prior to initiation of services.  Please see initial visit note for detailed documentation.   Patient agreed to services and verbal consent obtained.   Assessment: Review of patient past medical history, allergies, medications, health status, including review of consultants reports, laboratory and other test data, was performed as part of comprehensive evaluation and provision of chronic care management services.   SDOH (Social Determinants of Health) assessments and interventions performed:    CCM Care Plan  Allergies  Allergen Reactions  . Morphine And Related Swelling and Other (See Comments)    Facial swelling  . Latex Rash  . Statins Other (See Comments)    Myalgias     Outpatient Encounter Medications as of 06/29/2020  Medication Sig Note  . ACCU-CHEK AVIVA PLUS test strip TEST BLOOD SUGAR FOUR TIMES DAILY   . Accu-Chek Softclix Lancets lancets Use as instructed to test BG up to 3 times daily   . Alcohol Swabs (B-D SINGLE USE SWABS REGULAR) PADS Test BS 4 times daily Dx E11.9   . BD PEN NEEDLE NANO 2ND GEN 32G X 4 MM MISC Inject 1 application into the skin in the morning and at bedtime.   . clopidogrel (PLAVIX) 75 MG tablet Take 1 tablet (75 mg total) by mouth daily.   . empagliflozin (JARDIANCE) 25 MG TABS tablet Take 1 tablet (25 mg total) by mouth daily before  breakfast. CANCEL JARDIANCE 10MG  PRESCRIPTION   . ergocalciferol (VITAMIN D2) 1.25 MG (50000 UT) capsule Take 1 capsule (50,000 Units total) by mouth once a week. No specific day   . ezetimibe (ZETIA) 10 MG tablet Take 1 tablet (10 mg total) by mouth daily.   . fenofibrate 160 MG tablet TAKE 1 TABLET EVERY DAY   . insulin glargine, 2 Unit Dial, (TOUJEO MAX SOLOSTAR) 300 UNIT/ML Solostar Pen Inject 48 Units into the skin daily.   . insulin lispro (HUMALOG) 100 UNIT/ML injection Inject 10-20 Units into the skin 3 (three) times daily with meals. 06/27/2020: Was discontinued on 05/24/20 but patient is still taking at bedtime PRN if blood sugar is "too high"  . levothyroxine (SYNTHROID) 88 MCG tablet Take 1 tablet (88 mcg total) by mouth daily before breakfast.   . losartan-hydrochlorothiazide (HYZAAR) 50-12.5 MG tablet Take by mouth.   . pantoprazole (PROTONIX) 40 MG tablet Take 1 tablet (40 mg total) by mouth daily.   . [DISCONTINUED] metFORMIN (GLUCOPHAGE) 1000 MG tablet Take 1 tablet (1,000 mg total) by mouth 2 (two) times daily with a meal. (Patient taking differently: Take 1,000 mg by mouth 2 (two) times daily with a meal. Only taking once a day)    No facility-administered encounter medications on file as of 06/29/2020.    Patient Active Problem List   Diagnosis Date Noted  . Stage 4 chronic kidney disease ([place]) 05/25/2020  . 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  . Chronic bilateral low back pain without sciatica  07/13/2019  . Carotid stenosis, right 12/10/2017  . Left hip pain 06/04/2017  . Claudication in peripheral vascular disease ([place]) 10/30/2016  . Vitamin D deficiency 05/07/2016  . Gastroesophageal reflux disease without esophagitis 01/28/2016  . Coronary artery disease due to lipid rich plaque 10/18/2015  . Hypothyroidism 10/18/2015  . Shortness of breath 09/15/2014  . S/P -[place] SFA PTA with restenosis  07/06/2014  . Hypertension associated with diabetes ([place]) 06/28/2014  .  Hyperlipidemia-statin intolerant 06/28/2014  . DM (diabetes mellitus) ([place]) 06/28/2014  . Peripheral arterial disease - Bilateral SFA  06/28/2014    Conditions to be addressed/monitored:DMII and CKD  Care Plan : RNCM: Diabetes Type 2 (Adult)    Problem: Glycemic Management (Diabetes, Type 2)   Priority: Medium    Long-Range Goal: Glycemic Management Optimized   Start Date: 05/22/2020  Recent Progress: Not on track  Priority: Medium  Note:   Current Barriers:  Marland Kitchen Knowledge Deficits related to when to check blood sugar and take insulin . Chronic Disease Management support and education needs related to self-management of diabetes in a patient with hypertension and hyperlipidemia . Knowledge deficit related to insulin dosage directions . Sore fingers from frequency of testing  Nurse Case Manager Clinical Goal(s):  . patient will verbalize understanding of plan for insulin dosing and scheduling . patient will work with PCP to address needs related to medical management of diabetes . patient will meet with RN Care Manager to address self-management of diabetes . Patient will meet with PharmD to address management of diabetes  Interventions:  . 1:1 collaboration with Sharion Balloon, FNP regarding development and update of comprehensive plan of care as evidenced by provider attestation and co-signature . Inter-disciplinary care team collaboration (see longitudinal plan of care) . Evaluation of current treatment plan related to diabetes and patient's adherence to plan as established by provider. . Chart reviewed including relevant office notes and lab results . Reviewed and discussed most recent A1C result and improvement over past 10 months . Reviewed and discussed medications o Medications are affordable at present but is coming up on doughnut hole due to high cost to insurance - Will assist with Jardiance prescription assistance through [employer] - Can apply for assistance through  [place] for insulin and other meds once hits doughnut hole o Compliant with toujeo and jardiance o Reviewed Humalog usage - Was not taking it regularly at meals but would it take at bedtime with toujeo if blood sugar is "too high" - Has been taking 10-20 units before supper after checking blood sugar. Is not taking it at bedtime any longer.  . Reinforced how Humalog and Toujeo work . Reinforced need to check and record blood sugar before meals and at bedtime . Reinforced need to eat meals at regular intervals to help keep blood sugar stable . Provided with RNCM contact number and encouraged to reach out as needed . Reviewed upcoming appointment with Clinical Pharmacist for medication review and diabetes management assistance  Patient Goals/Self-Care Activities Over the next 30 days, patient will: . Check blood sugar before each meal and at bedtime . Write blood sugar in log . Take medications as instructed . Eat a low carb diet at regular intervals to keep blood sugar stable . Keep appointment with clinical pharmacist on 07/05/20 . Call PCP [phone] if blood sugar is below 70 or above 250 . Call [facility] as needed [phone]     Follow Up Plan:  . Telephone follow up  appointment with care management team member scheduled for: 07/27/20 with RNCM . The patient has been provided with contact information for the care management team and has been advised to call with any health related questions or concerns.  . Next PharmD appointment is scheduled for 07/05/20 . Next PCP appointment scheduled for: 08/27/20 . Next nephrology appointment scheduled for: 07/19/20 with Dr Theador Hawthorne . Next cardiology appointment is scheduled for 07/25/20 with Dr Gwenlyn Found  Chong Sicilian, BSN, RN-BC [place] [facility] [place] Management Direct Dial: [phone]

## 2020-07-10 ENCOUNTER — Other Ambulatory Visit: Payer: Self-pay | Admitting: *Deleted

## 2020-07-10 MED ORDER — CLOPIDOGREL BISULFATE 75 MG PO TABS: 75.0000 mg | ORAL_TABLET | Freq: Every day | ORAL | 0 refills | Status: DC

## 2020-07-11 ENCOUNTER — Other Ambulatory Visit: Payer: Self-pay | Admitting: *Deleted

## 2020-07-11 DIAGNOSIS — E7800 Pure hypercholesterolemia, unspecified: Secondary | ICD-10-CM

## 2020-07-11 MED ORDER — EZETIMIBE 10 MG PO TABS: 10.0000 mg | ORAL_TABLET | Freq: Every day | ORAL | 0 refills | Status: DC

## 2020-07-12 ENCOUNTER — Telehealth: Payer: Self-pay

## 2020-07-12 NOTE — Telephone Encounter (Signed)
Spoke with [employer]

## 2020-07-19 ENCOUNTER — Ambulatory Visit: Payer: Medicare HMO | Admitting: Pharmacist

## 2020-07-19 DIAGNOSIS — E611 Iron deficiency: Secondary | ICD-10-CM | POA: Diagnosis not present

## 2020-07-19 DIAGNOSIS — I129 Hypertensive chronic kidney disease with stage 1 through stage 4 chronic kidney disease, or unspecified chronic kidney disease: Secondary | ICD-10-CM | POA: Diagnosis not present

## 2020-07-19 DIAGNOSIS — N189 Chronic kidney disease, unspecified: Secondary | ICD-10-CM | POA: Diagnosis not present

## 2020-07-19 DIAGNOSIS — E1122 Type 2 diabetes mellitus with diabetic chronic kidney disease: Secondary | ICD-10-CM | POA: Diagnosis not present

## 2020-07-19 DIAGNOSIS — N281 Cyst of kidney, acquired: Secondary | ICD-10-CM | POA: Diagnosis not present

## 2020-07-19 DIAGNOSIS — R809 Proteinuria, unspecified: Secondary | ICD-10-CM | POA: Diagnosis not present

## 2020-07-19 DIAGNOSIS — E1129 Type 2 diabetes mellitus with other diabetic kidney complication: Secondary | ICD-10-CM | POA: Diagnosis not present

## 2020-07-19 DIAGNOSIS — R768 Other specified abnormal immunological findings in serum: Secondary | ICD-10-CM | POA: Diagnosis not present

## 2020-07-20 ENCOUNTER — Ambulatory Visit: Payer: Medicare HMO | Admitting: Family

## 2020-07-24 ENCOUNTER — Ambulatory Visit (INDEPENDENT_AMBULATORY_CARE_PROVIDER_SITE_OTHER): Payer: Medicare HMO | Admitting: Pharmacist

## 2020-07-24 ENCOUNTER — Other Ambulatory Visit: Payer: Self-pay

## 2020-07-24 ENCOUNTER — Telehealth: Payer: Self-pay | Admitting: Pharmacist

## 2020-07-24 DIAGNOSIS — E119 Type 2 diabetes mellitus without complications: Secondary | ICD-10-CM | POA: Diagnosis not present

## 2020-07-24 MED ORDER — DAPAGLIFLOZIN PROPANEDIOL 10 MG PO TABS: 10.0000 mg | ORAL_TABLET | Freq: Every day | ORAL | 3 refills | Status: DC

## 2020-07-24 NOTE — Telephone Encounter (Signed)
Patient [place] for farxiga for 2022 Application filled out online Medication will ship to patient's home RX escribed to [facility] with 3 refills  

## 2020-07-24 NOTE — Progress Notes (Signed)
    07/24/2020 Name: Jessica Torres MRN: [id number] DOB: 1940/04/09   S:79yoF presents for diabetes evaluation, education, and management Patient was referred and last seen by Primary Care Provider on4/18/22.  I last saw this patient on 07/05/20.  Insurance coverage/medication affordability:[employer] Prob[employer] officer with medications.  Current diabetes medications include:toujeo, jardiance, humalog  Metformin discontinued due to renal function  Current hypertension medications include:losartan/hctz Goal 130/80  Current hyperlipidemia medications include:zetia, fenofibrate  STATIN ALLERGY/INTOLERANCE DOCUMENTED IN EMR--> yes, ICD10: G72   Patient denies hypoglycemic events.     O:  Lab Results  Component Value Date   HGBA1C 8.4 (H) 05/24/2020    Lipid Panel     Component Value Date/Time   CHOL 186 07/13/2019 1037   TRIG 188 (H) 07/13/2019 1037   HDL 45 07/13/2019 1037   CHOLHDL 4.1 07/13/2019 1037   LDLCALC 108 (H) 07/13/2019 1037    Home fasting blood sugars: 180-200  2 hour post-meal/random blood sugars: n/a.    Clinical Atherosclerotic Cardiovascular Disease (ASCVD): No   The 10-year ASCVD risk score Mikey Bussing DC Jr., et al., 2013) is: 41%   Values used to calculate the score:     Age: 89 years     Sex: Female     Is Non-Hispanic African American: No     Diabetic: Yes     Tobacco smoker: No     Systolic Blood Pressure: 438 mmHg     Is BP treated: Yes     HDL Cholesterol: 45 mg/dL     Total Cholesterol: 186 mg/dL    A/P:  DiabetesT2DMcurrently uncontrolled. Patient was unable to take trulicity due to GI distress. Control is suboptimal due todiet/lifestyle  BASAL INSULIN(TOUJEO)INCREASED TO 50-55 UNITS   Restarted humalog 10-20 units with meals (SENT RX TO [facility])  Patient states Farxiga isn't working.  She would like to resume Jardiance 25mg .  Samples given  instructed patient to bring in financials to see if she  will qualify for BI care patient assistance  -Extensively discussed pathophysiology of diabetes, recommended lifestyle interventions, dietary effects on blood sugar control  -Counseled on s/sx of and management of hypoglycemia    Written patient instructions provided.  Total time in face to face counseling 25 minutes.     Regina Eck, PharmD, BCPS Clinical Pharmacist, [place]  II Phone [phone]

## 2020-07-25 ENCOUNTER — Encounter: Payer: Self-pay | Admitting: Cardiovascular Disease

## 2020-07-25 ENCOUNTER — Ambulatory Visit: Payer: Medicare HMO | Admitting: Cardiovascular Disease

## 2020-07-25 VITALS — BP 122/66 | HR 82 | Ht 65.0 in | Wt 148.2 lb

## 2020-07-25 DIAGNOSIS — E1159 Type 2 diabetes mellitus with other circulatory complications: Secondary | ICD-10-CM | POA: Diagnosis not present

## 2020-07-25 DIAGNOSIS — I739 Peripheral vascular disease, unspecified: Secondary | ICD-10-CM

## 2020-07-25 DIAGNOSIS — I152 Hypertension secondary to endocrine disorders: Secondary | ICD-10-CM

## 2020-07-25 DIAGNOSIS — E7800 Pure hypercholesterolemia, unspecified: Secondary | ICD-10-CM

## 2020-07-25 DIAGNOSIS — I2510 Atherosclerotic heart disease of native coronary artery without angina pectoris: Secondary | ICD-10-CM | POA: Diagnosis not present

## 2020-07-25 DIAGNOSIS — I2583 Coronary atherosclerosis due to lipid rich plaque: Secondary | ICD-10-CM | POA: Diagnosis not present

## 2020-07-25 LAB — LIPID PANEL
Chol/HDL Ratio: 4.1 ratio (ref 0.0–4.4)
Cholesterol, Total: 170 mg/dL (ref 100–199)
HDL: 41 mg/dL (ref >39)
LDL Chol Calc (NIH): 103 mg/dL — ABNORMAL HIGH (ref 0–99)
Triglycerides: 146 mg/dL (ref 0–149)
VLDL Cholesterol Cal: 26 mg/dL (ref 5–40)

## 2020-07-25 LAB — HEPATIC FUNCTION PANEL
ALT: 14 IU/L (ref 0–32)
AST: 30 IU/L (ref 0–40)
Albumin: 4.2 g/dL (ref 3.7–4.7)
Alkaline Phosphatase: 63 IU/L (ref 44–121)
Bilirubin Total: 0.3 mg/dL (ref 0.0–1.2)
Bilirubin, Direct: 0.16 mg/dL (ref 0.00–0.40)
Total Protein: 6.7 g/dL (ref 6.0–8.5)

## 2020-07-25 MED ORDER — CILOSTAZOL 50 MG PO TABS: 50.0000 mg | ORAL_TABLET | Freq: Two times a day (BID) | ORAL | 3 refills | Status: DC

## 2020-07-25 NOTE — Assessment & Plan Note (Signed)
History of essential hypertension blood pressure measured today 122/66.  She is on losartan and hydrochlorothiazide.

## 2020-07-25 NOTE — Patient Instructions (Signed)
Medication Instructions:   -Start cilostazol (pletal) 50mg  twice daily.  *If you need a refill on your cardiac medications before your next appointment, please call your pharmacy*   Lab Work: Your physician recommends that you have labs drawn today: lipid/liver profile.  If you have labs (blood work) drawn today and your tests are completely normal, you will receive your results only by: Marland Kitchen MyChart Message (if you have MyChart) OR . A paper copy in the mail If you have any lab test that is abnormal or we need to change your treatment, we will call you to review the results.   Follow-Up: At [facility], you and your health needs are our priority.  As part of our continuing mission to provide you with exceptional heart care, we have created designated Provider Care Teams.  These Care Teams include your primary Cardiologist (physician) and Advanced Practice Providers (APPs -  Physician Assistants and Nurse Practitioners) who all work together to provide you with the care you need, when you need it.  We recommend signing up for the patient portal called "MyChart".  Sign up information is provided on this After Visit Summary.  MyChart is used to connect with patients for Virtual Visits (Telemedicine).  Patients are able to view lab/test results, encounter notes, upcoming appointments, etc.  Non-urgent messages can be sent to your provider as well.   To learn more about what you can do with MyChart, go to [email or web address].    Your next appointment:   3 month(s)  The format for your next appointment:   In Person  Provider:   You will see one of the following Advanced Practice Providers on your designated Care Team:    Sande Rives, PA-C  Coletta Memos, FNP  Then, Quay Burow, MD will plan to see you again in 12 month(s).

## 2020-07-25 NOTE — Progress Notes (Signed)
07/25/2020 Jessica Torres   Jan 25, 1941  [id number]  Primary Physician Sharion Balloon, FNP Primary Cardiologist: Lorretta Harp MD [name], [place]  HPI:  Jessica Torres is a 80 y.o.   female referred to me by Dr. Paulla Dolly, her podiatrist, for evaluation of peripheral vascular disease. I last saw her in the office  07/26/2019.Marland KitchenHer cardiac risk factor profile is notable for treated hypertension and diabetes. She does have hyperlipidemia but is statin intolerant. 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 She's never had a heart attack. She denies chest pain. Has had occ shortness of breath now. She's noticed bilateral lower extremity lifestyle limiting claudication over the last several months. Recent lower extremity arterial Doppler studies performed 06/19/14 revealed ABIs in the mid 0.5 range bilaterally with occluded SFAs. She was admitted 07/06/14 for lower extremity angiography and percutaneous intervention for lifestyle limiting claudication. I performed directional atherectomy followed by drug-eluting balloon angioplasty.She has residual disease on the Lt SFA .   She had staged Bilateral lower extremity interventions on 07/06/14 (right SFA directional atherectomy, drug-eluting balloon angioplasty), 08/16/13 (directional atherectomy, stenting using Viabahn cover stent). Her ABIs improved from the 0.5 range bilaterally to normal. Her claudication has resolved as well. She's had recurrent claudication over the last several weeks to months with recent lower showed a Doppler study performed 12/29/14 revealing recurrent disease in both SFAs. I angiogram to her on 01/11/15 revealing a 95% "restenosis" in the previously treated right SFA and a totally occluded left SFA Viabahn stent. sshe underwent Hawk 1 directional atherectomy, and drug-eluting balloon angioplasty of her right SFA stenosis with an excellent angiographic result. Dopplers performed on 02/05/15 revealed an normal  right ABI with widely patent SFA and left ABI of 0.62.  Since I saw her in the office a year and a half ago she says that her right leg has had recurrent symptoms in both feet and legs are now numb with bilateral claudication. Recent Dopplers performed 10/03/16 revealed right ABI 0.73 and a left of 0.56. Her mid right SFA appears to be occluded.  I performed peripheral angiography on her 10/30/16 revealing an occluded left SFAViabahncovered stent and occluded right SFA. I performed one directional atherectomy followed by drug-eluting balloon angioplasty of the right SFA resulting in excellent angiographic result. Her post procedure Dopplers performed on 10/27/16 revealed an increase in her right ABI from 0.73 up to 1. Her left ABI is 0.42. She is significantly symptomatically on the left side. 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  I referred her to vascular surgery for consideration of left femoropopliteal bypass grafting which was deemed not a viable option because of lack of adequate venous conduit.  She does not smoke.  I performed peripheral angiography on her 10/30/2016 revealing an occluded Viabahn stent on the left with a right SFA long CTO which I atherectomized performed DCB on.  She did notice improvement in her symptoms after that.  Unfortunately, her most recent Doppler studies performed 07/12/2019 revealed ABIs in the mid 0.6 range bilaterally with an occluded right SFA.  Since I saw her a year ago she is remained stable.  She does complain of some burning and numbness in her feet which may be related to her diabetic peripheral neuropathy as well as some bilateral calf claudication.  She denies chest pain or shortness of breath.  Current Meds  Medication Sig  . ACCU-CHEK AVIVA PLUS test strip TEST BLOOD SUGAR FOUR TIMES  DAILY  . Accu-Chek Softclix Lancets lancets Use as instructed to test BG up to 3 times daily  . Alcohol Swabs (B-D SINGLE USE SWABS REGULAR) PADS Test BS 4  times daily Dx E11.9  . BD PEN NEEDLE NANO 2ND GEN 32G X 4 MM MISC Inject 1 application into the skin in the morning and at bedtime.  . cilostazol (PLETAL) 50 MG tablet Take 1 tablet (50 mg total) by mouth 2 (two) times daily.  . clopidogrel (PLAVIX) 75 MG tablet Take 1 tablet (75 mg total) by mouth daily.  . empagliflozin (JARDIANCE) 25 MG TABS tablet Take 25 mg by mouth daily.  . ergocalciferol (VITAMIN D2) 1.25 MG (50000 UT) capsule Take 1 capsule (50,000 Units total) by mouth once a week. No specific day  . ezetimibe (ZETIA) 10 MG tablet Take 1 tablet (10 mg total) by mouth daily.  . fenofibrate 160 MG tablet TAKE 1 TABLET EVERY DAY  . insulin aspart (NOVOLOG FLEXPEN) 100 UNIT/ML FlexPen Inject 10-20 Units into the skin 3 (three) times daily with meals.  . insulin glargine, 2 Unit Dial, (TOUJEO MAX SOLOSTAR) 300 UNIT/ML Solostar Pen Inject 48 Units into the skin daily.  Marland Kitchen levothyroxine (SYNTHROID) 88 MCG tablet Take 1 tablet (88 mcg total) by mouth daily before breakfast.  . losartan-hydrochlorothiazide (HYZAAR) 50-12.5 MG tablet Take by mouth.  . pantoprazole (PROTONIX) 40 MG tablet Take 1 tablet (40 mg total) by mouth daily.     Allergies  Allergen Reactions  . Morphine And Related Swelling and Other (See Comments)    Facial swelling  . Latex Rash  . Statins Other (See Comments)    Myalgias     Social History   Socioeconomic History  . Marital status: Divorced    Spouse name: Not on file  . Number of children: Not on file  . Years of education: Not on file  . Highest education level: Not on file  Occupational History  . Not on file  Tobacco Use  . Smoking status: Never Smoker  . Smokeless tobacco: Never Used  Vaping Use  . Vaping Use: Never used  Substance and Sexual Activity  . Alcohol use: Yes    Alcohol/week: 0.0 standard drinks    Comment: 10/30/2016  "margarita q  2 wks or so"  . Drug use: No  . Sexual activity: Never  Other Topics Concern  . Not on file   Social History Narrative  . Not on file   Social Determinants of Health   Financial Resource Strain: Low Risk   . Difficulty of Paying Living Expenses: Not very hard  Food Insecurity: No Food Insecurity  . Worried About Charity fundraiser in the Last Year: Never true  . Ran Out of Food in the Last Year: Never true  Transportation Needs: No Transportation Needs  . Lack of Transportation (Medical): No  . Lack of Transportation (Non-Medical): No  Physical Activity: Inactive  . Days of Exercise per Week: 0 days  . Minutes of Exercise per Session: 0 min  Stress: Not on file  Social Connections: Moderately Integrated  . Frequency of Communication with Friends and Family: More than three times a week  . Frequency of Social Gatherings with Friends and Family: More than three times a week  . Attends Religious Services: More than 4 times per year  . Active Member of Clubs or Organizations: Yes  . Attends Archivist Meetings: More than 4 times per year  . Marital Status: Divorced  Intimate Partner Violence: Not on file     Review of Systems: General: negative for chills, fever, night sweats or weight changes.  Cardiovascular: negative for chest pain, dyspnea on exertion, edema, orthopnea, palpitations, paroxysmal nocturnal dyspnea or shortness of breath Dermatological: negative for rash Respiratory: negative for cough or wheezing Urologic: negative for hematuria Abdominal: negative for nausea, vomiting, diarrhea, bright red blood per rectum, melena, or hematemesis Neurologic: negative for visual changes, syncope, or dizziness All other systems reviewed and are otherwise negative except as noted above.    Blood pressure 122/66, pulse 82, height 5\' 5"  (1.651 m), weight 148 lb 3.2 oz (67.2 kg), SpO2 99 %.  General appearance: alert and no distress Neck: no adenopathy, no carotid bruit, no JVD, supple, symmetrical, trachea midline and thyroid not enlarged, symmetric, no  tenderness/mass/nodules Lungs: clear to auscultation bilaterally Heart: regular rate and rhythm, S1, S2 normal, no murmur, click, rub or gallop Extremities: extremities normal, atraumatic, no cyanosis or edema Pulses: Absent pedal pulses Skin: Skin color, texture, turgor normal. No rashes or lesions Neurologic: Alert and oriented X 3, normal strength and tone. Normal symmetric reflexes. Normal coordination and gait  EKG sinus rhythm at 82 with low limb voltage.  I personally reviewed this EKG.  ASSESSMENT AND PLAN:   Peripheral arterial disease - Bilateral SFA  History of peripheral arterial disease status post multiple right left lower extremity SFA interventions now shown to be occluded by duplex ultrasound and angiography.  She does have claudication in both legs as well as symptoms of diabetic peripheral neuropathy.  I am going to begin her on Pletal 50 mg p.o. twice daily and will reassess in 3 months.  Hypertension associated with diabetes ([place]) History of essential hypertension blood pressure measured today 122/66.  She is on losartan and hydrochlorothiazide.  Hyperlipidemia-statin intolerant History of hyperlipidemia intolerant to statin therapy on Zetia.  Her last lipid profile performed a year ago revealed total cholesterol 186, LDL 108 and HDL 45, not at goal for secondary prevention.  We will recheck a lipid liver profile today.  If she remains high I will refer her to Dr. Debara Pickett for alternative treatment strategies.      Lorretta Harp MD FACP,FACC,FAHA, [facility] 07/25/2020 9:28 AM

## 2020-07-25 NOTE — Assessment & Plan Note (Signed)
History of hyperlipidemia intolerant to statin therapy on Zetia.  Her last lipid profile performed a year ago revealed total cholesterol 186, LDL 108 and HDL 45, not at goal for secondary prevention.  We will recheck a lipid liver profile today.  If she remains high I will refer her to Dr. Debara Pickett for alternative treatment strategies.

## 2020-07-25 NOTE — Assessment & Plan Note (Signed)
History of peripheral arterial disease status post multiple right left lower extremity SFA interventions now shown to be occluded by duplex ultrasound and angiography.  She does have claudication in both legs as well as symptoms of diabetic peripheral neuropathy.  I am going to begin her on Pletal 50 mg p.o. twice daily and will reassess in 3 months.

## 2020-07-27 ENCOUNTER — Ambulatory Visit (INDEPENDENT_AMBULATORY_CARE_PROVIDER_SITE_OTHER): Payer: Medicare HMO | Admitting: *Deleted

## 2020-07-27 DIAGNOSIS — E119 Type 2 diabetes mellitus without complications: Secondary | ICD-10-CM

## 2020-07-27 DIAGNOSIS — N184 Chronic kidney disease, stage 4 (severe): Secondary | ICD-10-CM | POA: Diagnosis not present

## 2020-07-27 NOTE — Patient Instructions (Signed)
Visit Information  PATIENT GOALS: Goals Addressed            This Visit's Progress   . Follow My Treatment Plan-Chronic Kidney       Timeframe:  Long-Range Goal Priority:  Medium Start Date:   06/27/20                          Expected End Date:  06/27/21                     Follow Up Date 08/29/20   . Patient will self administer medications as prescribed . Patient will attend all scheduled provider appointments . Patient will continue to perform ADL's independently . Patient will continue to perform IADL's independently . Patient will call provider office for new concerns or questions . Follow CKD dietary recommendations  . Keep all medical appointments . Call RN Care Manager as needed [phone]   Why is this important?    Staying as healthy as you can is very important. This may mean making changes if you smoke, don't exercise or eat poorly.   A healthy lifestyle is an important goal for you.   Following the treatment plan and making changes may be hard.   Try some of these steps to help keep the disease from getting worse.     Notes:     Marland Kitchen Monitor and Manage My Blood Sugar-Diabetes Type 2       Timeframe:  Long-Range Goal Priority:  High Start Date:   05/22/20                          Expected End Date: 03/09/21                       Follow Up Date 08/29/20   . Check blood sugar before each meal and at bedtime . Write blood sugar in log . Take all medication as prescribed . Only take Humalog or Novolog with meals. Do not take at bedtime . Eat a low carb diet at regular intervals to keep blood sugar stable . Follow-up with PharmD as needed for medication adjustments . Call PCP [phone] if blood sugar is below 70 or above 250 . Call [facility] as needed [phone]   Why is this important?    Checking your blood sugar at home helps to keep it from getting very high or very low.   Writing the results in a diary or log helps the doctor know how to care for you.    Your blood sugar log should have the time, date and the results.   Also, write down the amount of insulin or other medicine that you take.   Other information, like what you ate, exercise done and how you were feeling, will also be helpful.     Notes:        The patient verbalized understanding of instructions, educational materials, and care plan provided today and declined offer to receive copy of patient instructions, educational materials, and care plan.   Follow Up Plan:  . Telephone follow up appointment with care management team member scheduled for: 08/29/20 with RNCM . The patient has been provided with contact information for the care management team and has been advised to call with any health related questions or concerns.  . Next PCP appointment scheduled for: 08/27/20  Chong Sicilian, BSN, RN-BC Embedded Chronic  Care Manager [place] [facility] [place] Management Direct Dial: [phone]

## 2020-07-27 NOTE — Chronic Care Management (AMB) (Signed)
Chronic Care Management   CCM RN Visit Note  07/27/2020 Name: Jessica Torres MRN: [id number] DOB: 1940-12-23  Subjective: Jessica Torres is a 80 y.o. year old female who is a primary care patient of Jessica Balloon, FNP. The care management team was consulted for assistance with disease management and care coordination needs.    Engaged with patient by telephone for follow up visit in response to provider referral for case management and/or care coordination services.   Consent to Services:  The patient was given information about Chronic Care Management services, agreed to services, and gave verbal consent prior to initiation of services.  Please see initial visit note for detailed documentation.   Patient agreed to services and verbal consent obtained.   Assessment: Review of patient past medical history, allergies, medications, health status, including review of consultants reports, laboratory and other test data, was performed as part of comprehensive evaluation and provision of chronic care management services.   SDOH (Social Determinants of Health) assessments and interventions performed:    CCM Care Plan  Allergies  Allergen Reactions  . Morphine And Related Swelling and Other (See Comments)    Facial swelling  . Latex Rash  . Statins Other (See Comments)    Myalgias     Outpatient Encounter Medications as of 07/27/2020  Medication Sig  . ACCU-CHEK AVIVA PLUS test strip TEST BLOOD SUGAR FOUR TIMES DAILY  . Accu-Chek Softclix Lancets lancets Use as instructed to test BG up to 3 times daily  . Alcohol Swabs (B-D SINGLE USE SWABS REGULAR) PADS Test BS 4 times daily Dx E11.9  . BD PEN NEEDLE NANO 2ND GEN 32G X 4 MM MISC Inject 1 application into the skin in the morning and at bedtime.  . cilostazol (PLETAL) 50 MG tablet Take 1 tablet (50 mg total) by mouth 2 (two) times daily.  . clopidogrel (PLAVIX) 75 MG tablet Take 1 tablet (75 mg total) by mouth daily.  . empagliflozin  (JARDIANCE) 25 MG TABS tablet Take 25 mg by mouth daily.  . ergocalciferol (VITAMIN D2) 1.25 MG (50000 UT) capsule Take 1 capsule (50,000 Units total) by mouth once a week. No specific day  . ezetimibe (ZETIA) 10 MG tablet Take 1 tablet (10 mg total) by mouth daily.  . fenofibrate 160 MG tablet TAKE 1 TABLET EVERY DAY  . insulin aspart (NOVOLOG FLEXPEN) 100 UNIT/ML FlexPen Inject 10-20 Units into the skin 3 (three) times daily with meals.  . insulin glargine, 2 Unit Dial, (TOUJEO MAX SOLOSTAR) 300 UNIT/ML Solostar Pen Inject 48 Units into the skin daily.  Marland Kitchen levothyroxine (SYNTHROID) 88 MCG tablet Take 1 tablet (88 mcg total) by mouth daily before breakfast.  . losartan-hydrochlorothiazide (HYZAAR) 50-12.5 MG tablet Take by mouth.  . pantoprazole (PROTONIX) 40 MG tablet Take 1 tablet (40 mg total) by mouth daily.  . [DISCONTINUED] metFORMIN (GLUCOPHAGE) 1000 MG tablet Take 1 tablet (1,000 mg total) by mouth 2 (two) times daily with a meal. (Patient taking differently: Take 1,000 mg by mouth 2 (two) times daily with a meal. Only taking once a day)   No facility-administered encounter medications on file as of 07/27/2020.    Patient Active Problem List   Diagnosis Date Noted  . Stage 4 chronic kidney disease ([place]) 05/25/2020  . 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  . Chronic bilateral low back pain without sciatica 07/13/2019  . Carotid stenosis, right 12/10/2017  . Left hip pain 06/04/2017  . Claudication in peripheral vascular disease ([place]) 10/30/2016  .  Vitamin D deficiency 05/07/2016  . Gastroesophageal reflux disease without esophagitis 01/28/2016  . Coronary artery disease due to lipid rich plaque 10/18/2015  . Hypothyroidism 10/18/2015  . Shortness of breath 09/15/2014  . S/P -[place] SFA PTA with restenosis  07/06/2014  . Hypertension associated with diabetes ([place]) 06/28/2014  . Hyperlipidemia-statin intolerant 06/28/2014  . DM (diabetes mellitus) ([place]) 06/28/2014  . Peripheral arterial  disease - Bilateral SFA  06/28/2014    Conditions to be addressed/monitored: DM, HTN, HLD, hypothyroidism, CKD  Care Plan : RNCM: Diabetes Type 2 (Adult)  Updates made by Jessica China, RN since 07/27/2020 12:00 AM    Problem: Glycemic Management (Diabetes, Type 2)   Priority: Medium    Long-Range Goal: Glycemic Management Optimized   Start Date: 05/22/2020  This Visit's Progress: Not on track  Recent Progress: Not on track  Priority: Medium  Note:   Objective: Lab Results  Component Value Date   HGBA1C 8.4 (H) 05/24/2020   HGBA1C 8.6 (H) 04/26/2020   HGBA1C 9.2 (H) 01/25/2020   Lab Results  Component Value Date   LDLCALC 103 (H) 07/25/2020   CREATININE 1.87 (H) 05/24/2020   Current Barriers:  Marland Kitchen Knowledge Deficits related to when to check blood sugar and take insulin . Chronic Disease Management support and education needs related to self-management of diabetes in a patient with hypertension and hyperlipidemia . Knowledge deficit related to insulin dosage directions  Nurse Case Manager Clinical Goal(s):  . patient will verbalize understanding of plan for insulin dosing and scheduling . patient will work with PCP/PharmD to address needs related to medical management of diabetes . patient will meet with RN Care Manager to address self-management of diabetes  Interventions:  . 1:1 collaboration with Jessica Balloon, FNP regarding development and update of comprehensive plan of care as evidenced by provider attestation and co-signature . Inter-disciplinary care team collaboration (see longitudinal plan of care) . Evaluation of current treatment plan related to diabetes and patient's adherence to plan as established by provider. . Chart reviewed including relevant office notes and lab results . Reviewed and discussed most recent A1C result and improvement over past 10 months . Reviewed and discussed medications and recent changes at office visit with Jessica Torres,  PharmD . Discussed switch from Humalog to Novolog and their similarities  . Assessed knowledge of how to use rapid acting insulin and reinforced need to take 10-20 units with meals only. Do not take at bedtime without a meal. . Discussed medication cost o PharmD applied for prescription assistance for [employer]. Awaiting response.  o Can apply for assistance through [place] for insulin and other meds once hits doughnut hole . Verbal education provided on how rapid acting insulin (Humalog/Novolog) and Toujeo work . Assessed knowledge of when to check blood sugar . Extensive verbal instruction provided on the need to check and record blood sugar before meals and at bedtime and as needed if she feels it is too high or too low . Reinforced need to eat meals at regular intervals to help keep blood sugar stable o 3 meals and a light snack in between if needed . Extensive verbal education provided on ADA/Carb modified diet and and appropriate food choices . Provided with RNCM contact number and encouraged to reach out as needed  Patient Goals/Self-Care Activities Over the next 30 days, patient will: . Check blood sugar before each meal and at bedtime . Write blood sugar in log . Take all  medication as prescribed . Only take Humalog or Novolog with meals. Do not take at bedtime . Eat a low carb diet at regular intervals to keep blood sugar stable . Follow-up with PharmD as needed for medication adjustments . Call PCP [phone] if blood sugar is below 70 or above 250 . Call [facility] as needed [phone]   Care Plan : RNCM: Chronic Kidney (Adult)  Updates made by Jessica China, RN since 07/27/2020 12:00 AM    Problem: Disease Progression   Priority: Medium    Long-Range Goal: Disease Progression Prevented or Minimized   Start Date: 06/27/2020  This Visit's Progress: On track  Recent Progress: On track  Priority: Medium  Note:    Objective: Lab Results  Component Value Date   CREATININE 1.87 (H) 05/24/2020   BUN 25 05/24/2020   NA 133 (L) 05/24/2020   K 5.1 05/24/2020   CL 97 05/24/2020   CO2 20 05/24/2020   Current Barriers:   Ineffective Self Health Maintenance   Does not contact provider office for questions/concerns  Clinical Goal(s):  Marland Kitchen Collaboration with Jessica Balloon, FNP regarding development and update of comprehensive plan of care as evidenced by provider attestation and co-signature . Inter-disciplinary care team collaboration (see longitudinal plan of care)  patient will work with care management team to address care coordination and chronic disease management needs related to Chronic Kidney Disease in a patient with diabetes and hypertension.   Patient will work with nephrologist and PCP to address medical management of Chronic Kidney Disease    Interventions:   Evaluation of current treatment plan related to chronic kidney disease  Evaluation of self-management and patient's adherence to plan as established by provider.  Collaboration with Jessica Balloon, FNP regarding development and update of comprehensive plan of care as evidenced by provider attestation       and co-signature  Inter-disciplinary care team collaboration (see longitudinal plan of care)  Discussed plans with patient for ongoing care management follow up and provided patient with direct contact information for care management team  Chart reviewed including relevant office notes, lab results, and imaging reports  Encouraged patient to f/u with nephrologist regarding management of CKD  Reviewed and discussed medications, directions, and reason for use  Reinforced need to avoid nephrotoxic medication or supplement use including NSAIDs (nonsteroidal anti-inflammatory drugs), radio-contrast media, oral phosphate-containing bowel preparations, herbal supplements, protein supplements.   Encouraged lifestyle changes  including maintaining a healthy weight, exercise and activity, limiting alcohol consumption, smoking cessation to improve long-term outcomes.   Discussed diet and fluid intake. Previously mailed information on CKD diet.  Therapeutic listening utilized. Acknowledged difficulty in making life-long behavior change.  Provided verbal education on need to use pharmacologic therapy to prevent disease progression and manage complications or comorbidities, such as hypertension, cardiovascular disease, diabetes, anemia, bone disorders, peripheral vascular disease.   Provided with RNCM contact number and encouraged to reach out as needed  Encouraged patient to reach out to nephrologist with any questions or concerns regarding management or with any new or worsening symptoms  Self Care Activities:  . Performs ADL's independently . Performs IADL's independently  Patient Goals Over the next 60 days, patient will: . Patient will self administer medications as prescribed . Patient will attend all scheduled provider appointments . Patient will continue to perform ADL's independently . Patient will continue to perform IADL's independently . Patient will call provider office for new concerns or questions . Follow CKD dietary recommendations  . Keep  all medical appointments . Call RN Care Manager as needed [phone]   Follow Up Plan:  . Telephone follow up appointment with care management team member scheduled for: 08/29/20 with RNCM . The patient has been provided with contact information for the care management team and has been advised to call with any health related questions or concerns.  . Next PCP appointment scheduled for: 08/27/20  Jessica Torres, BSN, RN-BC [place] [facility] [place] Management Direct Dial: [phone]

## 2020-07-31 ENCOUNTER — Telehealth: Payer: Self-pay

## 2020-07-31 NOTE — Telephone Encounter (Addendum)
Left detailed voice message for patient to give office a call for recent blood work.  ----- Message from Lorretta Harp, MD sent at 07/26/2020  8:01 PM EDT ----- Not at goal for secondary prevention on ezetimibe and fenofibrate.  Statin intolerant.  Referred to Pharm.D. to begin PCSK9.

## 2020-08-20 ENCOUNTER — Other Ambulatory Visit: Payer: Self-pay | Admitting: Family

## 2020-08-20 DIAGNOSIS — E7800 Pure hypercholesterolemia, unspecified: Secondary | ICD-10-CM

## 2020-08-27 ENCOUNTER — Encounter: Payer: Self-pay | Admitting: Internal Medicine

## 2020-08-27 ENCOUNTER — Encounter: Payer: Self-pay | Admitting: Family

## 2020-08-27 ENCOUNTER — Ambulatory Visit: Payer: Medicare HMO | Admitting: Family

## 2020-08-29 ENCOUNTER — Telehealth: Payer: Medicare HMO | Admitting: *Deleted

## 2020-09-20 ENCOUNTER — Other Ambulatory Visit: Payer: Self-pay

## 2020-09-20 ENCOUNTER — Ambulatory Visit (INDEPENDENT_AMBULATORY_CARE_PROVIDER_SITE_OTHER): Payer: Medicare HMO | Admitting: Family

## 2020-09-20 ENCOUNTER — Encounter: Payer: Self-pay | Admitting: Family

## 2020-09-20 VITALS — BP 124/64 | HR 70 | Temp 97.0°F | Ht 65.0 in | Wt 149.2 lb

## 2020-09-20 DIAGNOSIS — I2510 Atherosclerotic heart disease of native coronary artery without angina pectoris: Secondary | ICD-10-CM

## 2020-09-20 DIAGNOSIS — E559 Vitamin D deficiency, unspecified: Secondary | ICD-10-CM | POA: Diagnosis not present

## 2020-09-20 DIAGNOSIS — I739 Peripheral vascular disease, unspecified: Secondary | ICD-10-CM | POA: Diagnosis not present

## 2020-09-20 DIAGNOSIS — E1159 Type 2 diabetes mellitus with other circulatory complications: Secondary | ICD-10-CM

## 2020-09-20 DIAGNOSIS — K219 Gastro-esophageal reflux disease without esophagitis: Secondary | ICD-10-CM

## 2020-09-20 DIAGNOSIS — I2583 Coronary atherosclerosis due to lipid rich plaque: Secondary | ICD-10-CM

## 2020-09-20 DIAGNOSIS — Z23 Encounter for immunization: Secondary | ICD-10-CM

## 2020-09-20 DIAGNOSIS — E039 Hypothyroidism, unspecified: Secondary | ICD-10-CM

## 2020-09-20 DIAGNOSIS — I152 Hypertension secondary to endocrine disorders: Secondary | ICD-10-CM | POA: Diagnosis not present

## 2020-09-20 DIAGNOSIS — E7800 Pure hypercholesterolemia, unspecified: Secondary | ICD-10-CM

## 2020-09-20 DIAGNOSIS — N184 Chronic kidney disease, stage 4 (severe): Secondary | ICD-10-CM

## 2020-09-20 DIAGNOSIS — Z794 Long term (current) use of insulin: Secondary | ICD-10-CM | POA: Diagnosis not present

## 2020-09-20 LAB — BAYER DCA HB A1C WAIVED: HB A1C (BAYER DCA - WAIVED): 8 % — ABNORMAL HIGH (ref <7.0)

## 2020-09-20 NOTE — Patient Instructions (Signed)
Diabetes Mellitus and Nutrition, Adult When you have diabetes, or diabetes mellitus, it is very important to have healthy eating habits because your blood sugar (glucose) levels are greatly affected by what you eat and drink. Eating healthy foods in the right amounts, at about the same times every day, can help you:  Control your blood glucose.  Lower your risk of heart disease.  Improve your blood pressure.  Reach or maintain a healthy weight. What can affect my meal plan? Every person with diabetes is different, and each person has different needs for a meal plan. Your health care provider may recommend that you work with a dietitian to make a meal plan that is best for you. Your meal plan may vary depending on factors such as:  The calories you need.  The medicines you take.  Your weight.  Your blood glucose, blood pressure, and cholesterol levels.  Your activity level.  Other health conditions you have, such as heart or kidney disease. How do carbohydrates affect me? Carbohydrates, also called carbs, affect your blood glucose level more than any other type of food. Eating carbs naturally raises the amount of glucose in your blood. Carb counting is a method for keeping track of how many carbs you eat. Counting carbs is important to keep your blood glucose at a healthy level, especially if you use insulin or take certain oral diabetes medicines. It is important to know how many carbs you can safely have in each meal. This is different for every person. Your dietitian can help you calculate how many carbs you should have at each meal and for each snack. How does alcohol affect me? Alcohol can cause a sudden decrease in blood glucose (hypoglycemia), especially if you use insulin or take certain oral diabetes medicines. Hypoglycemia can be a life-threatening condition. Symptoms of hypoglycemia, such as sleepiness, dizziness, and confusion, are similar to symptoms of having too much  alcohol.  Do not drink alcohol if: ? Your health care provider tells you not to drink. ? You are pregnant, may be pregnant, or are planning to become pregnant.  If you drink alcohol: ? Do not drink on an empty stomach. ? Limit how much you use to:  0-1 drink a day for women.  0-2 drinks a day for men. ? Be aware of how much alcohol is in your drink. In the [place] one drink equals one 12 oz bottle of beer (355 mL), one 5 oz glass of wine (148 mL), or one 1 oz glass of hard liquor (44 mL). ? Keep yourself hydrated with water, diet soda, or unsweetened iced tea.  Keep in mind that regular soda, juice, and other mixers may contain a lot of sugar and must be counted as carbs. What are tips for following this plan? Reading food labels  Start by checking the serving size on the "Nutrition Facts" label of packaged foods and drinks. The amount of calories, carbs, fats, and other nutrients listed on the label is based on one serving of the item. Many items contain more than one serving per package.  Check the total grams (g) of carbs in one serving. You can calculate the number of servings of carbs in one serving by dividing the total carbs by 15. For example, if a food has 30 g of total carbs per serving, it would be equal to 2 servings of carbs.  Check the number of grams (g) of saturated fats and trans fats in one serving. Choose foods that have   a low amount or none of these fats.  Check the number of milligrams (mg) of salt (sodium) in one serving. Most people should limit total sodium intake to less than 2,300 mg per day.  Always check the nutrition information of foods labeled as "low-fat" or "nonfat." These foods may be higher in added sugar or refined carbs and should be avoided.  Talk to your dietitian to identify your daily goals for nutrients listed on the label. Shopping  Avoid buying canned, pre-made, or processed foods. These foods tend to be high in fat, sodium, and added  sugar.  Shop around the outside edge of the grocery store. This is where you will most often find fresh fruits and vegetables, bulk grains, fresh meats, and fresh dairy. Cooking  Use low-heat cooking methods, such as baking, instead of high-heat cooking methods like deep frying.  Cook using healthy oils, such as olive, canola, or sunflower oil.  Avoid cooking with butter, cream, or high-fat meats. Meal planning  Eat meals and snacks regularly, preferably at the same times every day. Avoid going long periods of time without eating.  Eat foods that are high in fiber, such as fresh fruits, vegetables, beans, and whole grains. Talk with your dietitian about how many servings of carbs you can eat at each meal.  Eat 4-6 oz (112-168 g) of lean protein each day, such as lean meat, chicken, fish, eggs, or tofu. One ounce (oz) of lean protein is equal to: ? 1 oz (28 g) of meat, chicken, or fish. ? 1 egg. ?  cup (62 g) of tofu.  Eat some foods each day that contain healthy fats, such as avocado, nuts, seeds, and fish.   What foods should I eat? Fruits Berries. Apples. Oranges. Peaches. Apricots. Plums. Grapes. Mango. Papaya. Pomegranate. Kiwi. Cherries. Vegetables Lettuce. Spinach. Leafy greens, including kale, chard, collard greens, and mustard greens. Beets. Cauliflower. Cabbage. Broccoli. Carrots. Green beans. Tomatoes. Peppers. Onions. Cucumbers. Brussels sprouts. Grains Whole grains, such as whole-wheat or whole-grain bread, crackers, tortillas, cereal, and pasta. Unsweetened oatmeal. Quinoa. Brown or wild rice. Meats and other proteins Seafood. Poultry without skin. Lean cuts of poultry and beef. Tofu. Nuts. Seeds. Dairy Low-fat or fat-free dairy products such as milk, yogurt, and cheese. The items listed above may not be a complete list of foods and beverages you can eat. Contact a dietitian for more information. What foods should I avoid? Fruits Fruits canned with  syrup. Vegetables Canned vegetables. Frozen vegetables with butter or cream sauce. Grains Refined white flour and flour products such as bread, pasta, snack foods, and cereals. Avoid all processed foods. Meats and other proteins Fatty cuts of meat. Poultry with skin. Breaded or fried meats. Processed meat. Avoid saturated fats. Dairy Full-fat yogurt, cheese, or milk. Beverages Sweetened drinks, such as soda or iced tea. The items listed above may not be a complete list of foods and beverages you should avoid. Contact a dietitian for more information. Questions to ask a health care provider  Do I need to meet with a diabetes educator?  Do I need to meet with a dietitian?  What number can I call if I have questions?  When are the best times to check my blood glucose? Where to find more information:  American Diabetes Association: diabetes.org  Academy of Nutrition and Dietetics: www.eatright.org  National Institute of Diabetes and Digestive and Kidney Diseases: www.niddk.nih.gov  Association of Diabetes Care and Education Specialists: www.diabeteseducator.org Summary  It is important to have healthy eating   habits because your blood sugar (glucose) levels are greatly affected by what you eat and drink.  A healthy meal plan will help you control your blood glucose and maintain a healthy lifestyle.  Your health care provider may recommend that you work with a dietitian to make a meal plan that is best for you.  Keep in mind that carbohydrates (carbs) and alcohol have immediate effects on your blood glucose levels. It is important to count carbs and to use alcohol carefully. This information is not intended to replace advice given to you by your health care provider. Make sure you discuss any questions you have with your health care provider. Document Revised: 02/01/2019 Document Reviewed: 02/01/2019 Elsevier Patient Education  2021 Elsevier Inc.  

## 2020-09-20 NOTE — Progress Notes (Signed)
Subjective:    Patient ID: Jessica Torres, female    DOB: Jun 30, 1940, 80 y.o.   MRN: [id number]  Chief Complaint  Patient presents with   Medical Management of Chronic Issues    Would like to discuss diet medication    Pt presents to the office today for chronic follow up. She is followed by Vascular for PAD every  6 months. She is followed by nephrologists for CKD.  Diabetes She presents for her follow-up diabetic visit. She has type 2 diabetes mellitus. Associated symptoms include fatigue and foot paresthesias. Pertinent negatives for diabetes include no blurred vision. Symptoms are stable. Diabetic complications include heart disease, nephropathy and peripheral neuropathy. Risk factors for coronary artery disease include dyslipidemia, diabetes mellitus, hypertension and sedentary lifestyle. She is following a generally unhealthy diet. Her overall blood glucose range is 180-200 mg/dl. An ACE inhibitor/angiotensin II receptor blocker is being taken.  Hypertension This is a chronic problem. The current episode started more than 1 year ago. The problem has been waxing and waning since onset. The problem is uncontrolled. Associated symptoms include malaise/fatigue. Pertinent negatives include no blurred vision, peripheral edema or shortness of breath. Agents associated with hypertension include estrogens. Risk factors for coronary artery disease include dyslipidemia and obesity. The current treatment provides moderate improvement. Identifiable causes of hypertension include a thyroid problem.  Thyroid Problem Presents for follow-up visit. Symptoms include constipation and fatigue. Patient reports no hoarse voice. The symptoms have been stable. Her past medical history is significant for hyperlipidemia.  Gastroesophageal Reflux She complains of belching and heartburn. She reports no hoarse voice. This is a chronic problem. The current episode started more than 1 year ago. The problem occurs  occasionally. The problem has been waxing and waning. Associated symptoms include fatigue.  Hyperlipidemia This is a chronic problem. The current episode started more than 1 year ago. Exacerbating diseases include obesity. Pertinent negatives include no shortness of breath. Current antihyperlipidemic treatment includes statins. The current treatment provides moderate improvement of lipids. Risk factors for coronary artery disease include dyslipidemia, hypertension, a sedentary lifestyle and post-menopausal.     Review of Systems  Constitutional:  Positive for fatigue and malaise/fatigue.  HENT:  Negative for hoarse voice.   Eyes:  Negative for blurred vision.  Respiratory:  Negative for shortness of breath.   Gastrointestinal:  Positive for constipation and heartburn.  All other systems reviewed and are negative.     Objective:   Physical Exam Vitals reviewed.  Constitutional:      General: She is not in acute distress.    Appearance: She is well-developed.  HENT:     Head: Normocephalic and atraumatic.     Right Ear: Tympanic membrane normal.     Left Ear: Tympanic membrane normal.  Eyes:     Pupils: Pupils are equal, round, and reactive to light.  Neck:     Thyroid: No thyromegaly.  Cardiovascular:     Rate and Rhythm: Normal rate and regular rhythm.     Heart sounds: Normal heart sounds. No murmur heard. Pulmonary:     Effort: Pulmonary effort is normal. No respiratory distress.     Breath sounds: Normal breath sounds. No wheezing.  Abdominal:     General: Bowel sounds are normal. There is no distension.     Palpations: Abdomen is soft.     Tenderness: There is no abdominal tenderness.  Musculoskeletal:        General: No tenderness. Normal range of motion.  Cervical back: Normal range of motion and neck supple.  Skin:    General: Skin is warm and dry.  Neurological:     Mental Status: She is alert and oriented to person, place, and time.     Cranial Nerves: No  cranial nerve deficit.     Deep Tendon Reflexes: Reflexes are normal and symmetric.  Psychiatric:        Behavior: Behavior normal.        Thought Content: Thought content normal.        Judgment: Judgment normal.      BP 124/64   Pulse 70   Temp (!) 97 F (36.1 C)   Ht 5' 5"  (1.651 m)   Wt 149 lb 3.2 oz (67.7 kg)   SpO2 98%   BMI 24.83 kg/m      Assessment & Plan:  [place] [facility]EN comes in today with chief complaint of Medical Management of Chronic Issues (Would like to discuss diet medication )   Diagnosis and orders addressed:  1. Coronary artery disease due to lipid rich plaque - CMP14+EGFR - CBC with Differential/Platelet  2. Hypertension associated with diabetes ([place]) - CMP14+EGFR - CBC with Differential/Platelet  3. Gastroesophageal reflux disease without esophagitis - CMP14+EGFR - CBC with Differential/Platelet  4. Hypothyroidism, unspecified type - CMP14+EGFR - CBC with Differential/Platelet  5. Type 2 diabetes mellitus with other circulatory complication, with long-term current use of insulin (HCC) - Bayer DCA Hb A1c Waived - CMP14+EGFR - CBC with Differential/Platelet  6. Stage 4 chronic kidney disease (HCC) - CMP14+EGFR - CBC with Differential/Platelet  7. Claudication in peripheral vascular disease ([place]) - CMP14+EGFR - CBC with Differential/Platelet  8. Vitamin D deficiency - CMP14+EGFR - CBC with Differential/Platelet  9. Pure hypercholesterolemia - CMP14+EGFR - CBC with Differential/Platelet   Labs pending Health Maintenance reviewed Diet and exercise encouraged  Follow up plan: 3 months   [name], FNP

## 2020-09-20 NOTE — Addendum Note (Signed)
Addended by: Sarina Ser on: 09/20/2020 11:35 AM   Modules accepted: Orders

## 2020-09-21 LAB — CBC WITH DIFFERENTIAL/PLATELET
Basophils Absolute: 0.1 10*3/uL (ref 0.0–0.2)
Basos: 1 %
EOS (ABSOLUTE): 0.4 10*3/uL (ref 0.0–0.4)
Eos: 5 %
Hematocrit: 38.6 % (ref 34.0–46.6)
Hemoglobin: 12.4 g/dL (ref 11.1–15.9)
Immature Grans (Abs): 0 10*3/uL (ref 0.0–0.1)
Immature Granulocytes: 0 %
Lymphocytes Absolute: 2.2 10*3/uL (ref 0.7–3.1)
Lymphs: 27 %
MCH: 29 pg (ref 26.6–33.0)
MCHC: 32.1 g/dL (ref 31.5–35.7)
MCV: 90 fL (ref 79–97)
Monocytes Absolute: 0.9 10*3/uL (ref 0.1–0.9)
Monocytes: 11 %
Neutrophils Absolute: 4.4 10*3/uL (ref 1.4–7.0)
Neutrophils: 56 %
Platelets: 313 10*3/uL (ref 150–450)
RBC: 4.27 x10E6/uL (ref 3.77–5.28)
RDW: 13.4 % (ref 11.7–15.4)
WBC: 7.9 10*3/uL (ref 3.4–10.8)

## 2020-09-21 LAB — CMP14+EGFR
ALT: 14 IU/L (ref 0–32)
AST: 34 IU/L (ref 0–40)
Albumin/Globulin Ratio: 1.5 (ref 1.2–2.2)
Albumin: 4 g/dL (ref 3.7–4.7)
Alkaline Phosphatase: 61 IU/L (ref 44–121)
BUN/Creatinine Ratio: 13 (ref 12–28)
BUN: 21 mg/dL (ref 8–27)
Bilirubin Total: 0.4 mg/dL (ref 0.0–1.2)
CO2: 22 mmol/L (ref 20–29)
Calcium: 10.1 mg/dL (ref 8.7–10.3)
Chloride: 100 mmol/L (ref 96–106)
Creatinine, Ser: 1.63 mg/dL — ABNORMAL HIGH (ref 0.57–1.00)
Globulin, Total: 2.7 g/dL (ref 1.5–4.5)
Glucose: 209 mg/dL — ABNORMAL HIGH (ref 65–99)
Potassium: 4.9 mmol/L (ref 3.5–5.2)
Sodium: 136 mmol/L (ref 134–144)
Total Protein: 6.7 g/dL (ref 6.0–8.5)
eGFR: 32 mL/min/{1.73_m2} — ABNORMAL LOW (ref >59)

## 2020-09-25 ENCOUNTER — Other Ambulatory Visit: Payer: Self-pay | Admitting: Family

## 2020-09-25 ENCOUNTER — Ambulatory Visit (INDEPENDENT_AMBULATORY_CARE_PROVIDER_SITE_OTHER): Payer: Medicare HMO

## 2020-09-25 VITALS — Ht 65.0 in | Wt 149.0 lb

## 2020-09-25 DIAGNOSIS — Z794 Long term (current) use of insulin: Secondary | ICD-10-CM

## 2020-09-25 DIAGNOSIS — E1159 Type 2 diabetes mellitus with other circulatory complications: Secondary | ICD-10-CM

## 2020-09-25 DIAGNOSIS — Z0000 Encounter for general adult medical examination without abnormal findings: Secondary | ICD-10-CM

## 2020-09-25 MED ORDER — TOUJEO MAX SOLOSTAR 300 UNIT/ML ~~LOC~~ SOPN: 52.0000 [IU] | PEN_INJECTOR | Freq: Every day | SUBCUTANEOUS | 2 refills | Status: DC

## 2020-09-25 NOTE — Progress Notes (Signed)
Subjective:   Jessica Torres is a 80 y.o. female who presents for an Initial Medicare Annual Wellness Visit.  Virtual Visit via Telephone Note  I connected with  Jessica Torres on 09/25/20 at  9:45 AM EDT by telephone and verified that I am speaking with the correct person using two identifiers.  Location: Patient: Home Provider: [facility] Persons participating in the virtual visit: patient/Nurse Health Advisor   I discussed the limitations, risks, security and privacy concerns of performing an evaluation and management service by telephone and the availability of in person appointments. The patient expressed understanding and agreed to proceed.  Interactive audio and video telecommunications were attempted between this nurse and patient, however failed, due to patient having technical difficulties OR patient did not have access to video capability.  We continued and completed visit with audio only.  Some vital signs may be absent or patient reported.   Sheriece Jefcoat E Lylee Corrow, LPN   Review of Systems     Cardiac Risk Factors include: advanced age (>60men, >91 women);dyslipidemia;diabetes mellitus;hypertension;sedentary lifestyle     Objective:    Today's Vitals   09/25/20 0857  Weight: 149 lb (67.6 kg)  Height: 5\' 5"  (1.651 m)   Body mass index is 24.79 kg/m.  Advanced Directives 09/25/2020 12/24/2016 10/30/2016 10/30/2016 01/22/2015 01/11/2015 08/17/2014  Does Patient Have a Medical Advance Directive? No No - No Yes;No No No  Would patient like information on creating a medical advance directive? No - Patient declined - Yes (Inpatient - patient defers creating a medical advance directive at this time) No - Patient declined No - patient declined information No - patient declined information Yes - Educational materials given    Current Medications (verified) Outpatient Encounter Medications as of 09/25/2020  Medication Sig   ACCU-CHEK AVIVA PLUS test strip TEST BLOOD SUGAR FOUR TIMES DAILY    Accu-Chek Softclix Lancets lancets Use as instructed to test BG up to 3 times daily   Alcohol Swabs (B-D SINGLE USE SWABS REGULAR) PADS Test BS 4 times daily Dx E11.9   BD PEN NEEDLE NANO 2ND GEN 32G X 4 MM MISC Inject 1 application into the skin in the morning and at bedtime.   clopidogrel (PLAVIX) 75 MG tablet Take 1 tablet (75 mg total) by mouth daily.   empagliflozin (JARDIANCE) 25 MG TABS tablet Take 25 mg by mouth daily.   ergocalciferol (VITAMIN D2) 1.25 MG (50000 UT) capsule Take 1 capsule (50,000 Units total) by mouth once a week. No specific day   ezetimibe (ZETIA) 10 MG tablet Take 1 tablet (10 mg total) by mouth daily.   fenofibrate 160 MG tablet TAKE 1 TABLET EVERY DAY   ferrous sulfate 325 (65 FE) MG EC tablet Take by mouth.   insulin aspart (NOVOLOG FLEXPEN) 100 UNIT/ML FlexPen Inject 10-20 Units into the skin 3 (three) times daily with meals.   insulin glargine, 2 Unit Dial, (TOUJEO MAX SOLOSTAR) 300 UNIT/ML Solostar Pen Inject 48 Units into the skin daily.   levothyroxine (SYNTHROID) 88 MCG tablet Take 1 tablet (88 mcg total) by mouth daily before breakfast.   losartan-hydrochlorothiazide (HYZAAR) 50-12.5 MG tablet Take by mouth.   pantoprazole (PROTONIX) 40 MG tablet Take 1 tablet (40 mg total) by mouth daily.   cilostazol (PLETAL) 50 MG tablet Take 1 tablet (50 mg total) by mouth 2 (two) times daily. (Patient not taking: Reported on 09/25/2020)   [DISCONTINUED] metFORMIN (GLUCOPHAGE) 1000 MG tablet Take 1 tablet (1,000 mg total) by mouth 2 (two)  times daily with a meal. (Patient taking differently: Take 1,000 mg by mouth 2 (two) times daily with a meal. Only taking once a day)   No facility-administered encounter medications on file as of 09/25/2020.    Allergies (verified) Morphine and related, Latex, and Statins   History: Past Medical History:  Diagnosis Date   Arthritis    "fingers probably" (10/30/2016)   Bulging lumbar disc    Chronic lower back pain     Difficulty urinating    GERD (gastroesophageal reflux disease)    Heart murmur    hx   Hyperlipidemia    statin intolerant   Hypertension    Hypothyroidism    PAD (peripheral artery disease) (HCC)    Peripheral arterial disease (HCC)    Shortness of breath dyspnea    Sleep apnea    "dx'd years ago; had severe problems; all of the sudden it just went away" (07/06/2014) *still gone" (10/30/2016)   Stroke [facility]) ~ 2010   denies residual on 10/30/2016   Type 2 diabetes mellitus (HCC)    Urgency of urination    Uterine cancer [facility])    Past Surgical History:  Procedure Laterality Date   ANGIOPLASTY Right 01/22/2015   w/ atherectomy R sfa   BALLOON ANGIOPLASTY, ARTERY Right    SFA/notes 07/06/2014   BREAST EXCISIONAL BIOPSY Left    BREAST SURGERY     CARPAL TUNNEL RELEASE Right 1980's   DILATION AND CURETTAGE OF UTERUS     HEMORRHOID SURGERY     "dr lanced it; in dr's office"   INCISION AND DRAINAGE BREAST ABSCESS Left    LOWER EXTREMITY ANGIOGRAM N/A 07/06/2014   Procedure: LOWER EXTREMITY ANGIOGRAM & R-SFA atherectomy and balloon angioplasty;  Surgeon: Lorretta Harp, MD;  Location: [facility] CATH LAB;  Service: Cardiovascular;  Laterality: R   LOWER EXTREMITY ANGIOGRAM  10/30/2016   LOWER EXTREMITY ANGIOGRAPHY N/A 10/30/2016   Procedure: Lower Extremity Angiography;  Surgeon: Lorretta Harp, MD;  Location: [place] CV LAB;  Service: Cardiovascular;  Laterality: N/A;   PERIPHERAL VASCULAR CATHETERIZATION N/A 08/17/2014   Procedure: Lower Extremity Angiography;  Surgeon: Lorretta Harp, MD;  Location: [place] CV LAB;  Service: Cardiovascular;  Laterality: N/A;   PERIPHERAL VASCULAR CATHETERIZATION Left 08/17/2014   Procedure: Peripheral Vascular Atherectomy and Viabahn stent L-SFA;  Surgeon: Lorretta Harp, MD; Laterality: Left; L-SFA   PERIPHERAL VASCULAR CATHETERIZATION N/A 01/11/2015   Procedure: Lower Extremity Angiography;  Surgeon: Lorretta Harp, MD;  Location: [place]  CV LAB;  Service: Cardiovascular;  Laterality: N/A;   PERIPHERAL VASCULAR CATHETERIZATION Right 01/22/2015   Procedure: Peripheral Vascular Atherectomy;  Surgeon: Lorretta Harp, MD;  Location: [place] CV LAB;  Service: Cardiovascular;  Laterality: Right;   TUBAL LIGATION     VAGINAL HYSTERECTOMY     Family History  Problem Relation Age of Onset   Heart attack Mother    Heart disease Mother    Arrhythmia Mother        has PPM   Heart disease Father    Diabetes Sister    Social History   Socioeconomic History   Marital status: Divorced    Spouse name: Not on file   Number of children: 4   Years of education: Not on file   Highest education level: Not on file  Occupational History   Occupation: retired  Tobacco Use   Smoking status: Never   Smokeless tobacco: Never  Vaping Use   Vaping Use: Never  used  Substance and Sexual Activity   Alcohol use: Yes    Alcohol/week: 0.0 standard drinks    Comment: 10/30/2016  "margarita q  2 wks or so"   Drug use: No   Sexual activity: Never  Other Topics Concern   Not on file  Social History Narrative   2 sons and their sons are living with her right now   Another son lives next door and daughter lives in [place] Determinants of Health   Financial Resource Strain: Low Risk    Difficulty of Paying Living Expenses: Not hard at all  Food Insecurity: No Food Insecurity   Worried About Charity fundraiser in the Last Year: Never true   Arboriculturist in the Last Year: Never true  Transportation Needs: No Transportation Needs   Lack of Transportation (Medical): No   Lack of Transportation (Non-Medical): No  Physical Activity: Inactive   Days of Exercise per Week: 0 days   Minutes of Exercise per Session: 0 min  Stress: Stress Concern Present   Feeling of Stress : To some extent  Social Connections: Moderately Integrated   Frequency of Communication with Friends and Family: More than three times a week    Frequency of Social Gatherings with Friends and Family: More than three times a week   Attends Religious Services: More than 4 times per year   Active Member of [employer] or Organizations: Yes   Attends Music therapist: More than 4 times per year   Marital Status: Divorced    Tobacco Counseling Counseling given: Not Answered   Clinical Intake:  Pre-visit preparation completed: Yes  Pain : No/denies pain     BMI - recorded: 24.79 Nutritional Status: BMI of 19-24  Normal Nutritional Risks: None Diabetes: Yes CBG done?: No Did pt. bring in CBG monitor from home?: No  How often do you need to have someone help you when you read instructions, pamphlets, or other written materials from your doctor or pharmacy?: 1 - Never  Nutrition Risk Assessment:  Has the patient had any N/V/D within the last 2 months?  No  Does the patient have any non-healing wounds?  No  Has the patient had any unintentional weight loss or weight gain?  No   Diabetes:  Is the patient diabetic?  Yes  If diabetic, was a CBG obtained today?  No  Did the patient bring in their glucometer from home?  No  How often do you monitor your CBG's? BID.   Financial Strains and Diabetes Management:  Are you having any financial strains with the device, your supplies or your medication? No .  Does the patient want to be seen by Chronic Care Management for management of their diabetes?  No  Would the patient like to be referred to a Nutritionist or for Diabetic Management?  No   Diabetic Exams:  Diabetic Eye Exam: Completed 07/06/2019.  Diabetic Foot Exam: Completed 01/25/2020. Pt has been advised about the importance in completing this exam. Pt is scheduled for diabetic foot exam on 01/2021.    Interpreter Needed?: No  Information entered by :: Nicolas Sisler, LPN   Activities of Daily Living In your present state of health, do you have any difficulty performing the following activities: 09/25/2020   Hearing? Y  Vision? N  Difficulty concentrating or making decisions? Y  Walking or climbing stairs? Y  Dressing or bathing? N  Doing errands, shopping? N  Conservation officer, nature and  eating ? N  Using the Toilet? N  In the past six months, have you accidently leaked urine? Y  Comment sometimes can't make it to restroom in time  Do you have problems with loss of bowel control? N  Managing your Medications? N  Managing your Finances? N  Housekeeping or managing your Housekeeping? N  Some recent data might be hidden    Patient Care Team: Sharion Balloon, FNP as PCP - General (Family Medicine) Blanca Friend Royce Macadamia, [facility] (Pharmacist) Ilean China, RN as Case Manager Eloise Harman, DO as Consulting Physician (Gastroenterology)  Indicate any recent Medical Services you may have received from other than [facility] providers in the past year (date may be approximate).     Assessment:   This is a routine wellness examination for Rickita.  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 - damaged at work - was told hearing aid wouldn't help Vision Screening - Comments:: Wears glasses when driving and watching tv - up to date with annual eye exam with Dr Marin Comment in [place]  Dietary issues and exercise activities discussed: Current Exercise Habits: The patient does not participate in regular exercise at present, Exercise limited by: None identified   Goals Addressed             This Visit's Progress    Monitor and Manage My Blood Sugar-Diabetes Type 2   On track    Timeframe:  Long-Range Goal Priority:  High Start Date:   05/22/20                          Expected End Date: 03/09/21                       Follow Up Date 08/29/20   Check blood sugar before each meal and at bedtime Write blood sugar in log Take all medication as prescribed Only take Humalog or Novolog with meals. Do not take at bedtime Eat a low carb diet at  regular intervals to keep blood sugar stable Follow-up with PharmD as needed for medication adjustments Call PCP [phone] if blood sugar is below 70 or above 250 Call [facility] as needed [phone]   Why is this important?   Checking your blood sugar at home helps to keep it from getting very high or very low.  Writing the results in a diary or log helps the doctor know how to care for you.  Your blood sugar log should have the time, date and the results.  Also, write down the amount of insulin or other medicine that you take.  Other information, like what you ate, exercise done and how you were feeling, will also be helpful.     Notes:        Depression Screen PHQ 2/9 Scores 09/25/2020 09/20/2020 09/20/2020 06/25/2020 05/24/2020 04/26/2020 01/25/2020  PHQ - 2 Score 0 0 0 0 0 0 0  PHQ- 9 Score 5 11 - - - - -    Fall Risk Fall Risk  09/25/2020 09/20/2020 09/20/2020 06/25/2020 05/24/2020  Falls in the past year? 0 0 0 0 0  Number falls in past yr: 0 - - - -  Injury with Fall? 0 - - - -  Risk for fall due to : Impaired vision - - - -  Follow up Falls prevention discussed - - - -    FALL  RISK PREVENTION PERTAINING TO THE HOME:  Any stairs in or around the home? No  If so, are there any without handrails? No  Home free of loose throw rugs in walkways, pet beds, electrical cords, etc? Yes  Adequate lighting in your home to reduce risk of falls? Yes   ASSISTIVE DEVICES UTILIZED TO PREVENT FALLS:  Life alert? No  Use of a cane, walker or w/c? No  Grab bars in the bathroom? No  Shower chair or bench in shower? No  Elevated toilet seat or a handicapped toilet? No   TIMED UP AND GO:  Was the test performed? No . Telephonic visit.  Cognitive Function:     6CIT Screen 09/25/2020  What Year? 0 points  What month? 0 points  What time? 0 points  Count back from 20 0 points  Months in reverse 0 points  Repeat phrase 2 points  Total Score 2    Immunizations Immunization History   Administered Date(s) Administered   Influenza, High Dose Seasonal PF 01/28/2016, 12/04/2016, 12/10/2017   Influenza-Unspecified 01/28/2016, 12/04/2016, 12/10/2017   Pneumococcal Conjugate-13 01/28/2016   Pneumococcal Polysaccharide-23 07/07/2017   Tdap 09/20/2020   Zoster Recombinat (Shingrix) 09/20/2020    TDAP status: Up to date  Flu Vaccine status: Up to date  Pneumococcal vaccine status: Up to date  Covid-19 vaccine status: Declined, Education has been provided regarding the importance of this vaccine but patient still declined. Advised may receive this vaccine at local pharmacy or Health Dept.or vaccine clinic. Aware to provide a copy of the vaccination record if obtained from local pharmacy or Health Dept. Verbalized acceptance and understanding.  Qualifies for Shingles Vaccine? Yes   Zostavax completed Yes   Shingrix Completed?: No.    Education has been provided regarding the importance of this vaccine. Patient has been advised to call insurance company to determine out of pocket expense if they have not yet received this vaccine. Advised may also receive vaccine at local pharmacy or Health Dept. Verbalized acceptance and understanding.  Screening Tests Health Maintenance  Topic Date Due   DEXA SCAN  08/14/2012   OPHTHALMOLOGY EXAM  07/05/2020   COVID-19 Vaccine (1) 10/06/2020 (Originally 11/24/1945)   INFLUENZA VACCINE  10/08/2020   Zoster Vaccines- Shingrix (2 of 2) 11/15/2020   FOOT EXAM  01/24/2021   HEMOGLOBIN A1C  03/23/2021   TETANUS/TDAP  09/21/2030   Hepatitis C Screening  Completed   PNA vac Low Risk Adult  Completed   HPV VACCINES  Aged Out    Health Maintenance  Health Maintenance Due  Topic Date Due   DEXA SCAN  08/14/2012   OPHTHALMOLOGY EXAM  07/05/2020    Colorectal cancer screening: No longer required.   Mammogram status: Completed 11/07/2019. Repeat every year  Bone Density status: Completed 08/15/2010. Results reflect: Bone density results:  OSTEOPENIA. Repeat every 2 years. Due at next visit.  Lung Cancer Screening: (Low Dose CT Chest recommended if Age 74-80 years, 30 pack-year currently smoking OR have quit w/in 15years.) does not qualify.   Additional Screening:  Hepatitis C Screening: does not qualify; Completed 01/25/2020  Vision Screening: Recommended annual ophthalmology exams for early detection of glaucoma and other disorders of the eye. Is the patient up to date with their annual eye exam?  Yes  Who is the provider or what is the name of the office in which the patient attends annual eye exams? Dr Marin Comment If pt is not established with a provider, would they like to be referred  to a provider to establish care? No .   Dental Screening: Recommended annual dental exams for proper oral hygiene  Community Resource Referral / Chronic Care Management: CRR required this visit?  No   CCM required this visit?  No      Plan:     I have personally reviewed and noted the following in the patient's chart:   Medical and social history Use of alcohol, tobacco or illicit drugs  Current medications and supplements including opioid prescriptions. Patient is not currently taking opioid prescriptions. Functional ability and status Nutritional status Physical activity Advanced directives List of other physicians Hospitalizations, surgeries, and ER visits in previous 12 months Vitals Screenings to include cognitive, depression, and falls Referrals and appointments  In addition, I have reviewed and discussed with patient certain preventive protocols, quality metrics, and best practice recommendations. A written personalized care plan for preventive services as well as general preventive health recommendations were provided to patient.     Sandrea Hammond, LPN   9/37/9024   Nurse Notes: None

## 2020-09-25 NOTE — Patient Instructions (Signed)
Jessica Torres , Thank you for taking time to come for your Medicare Wellness Visit. I appreciate your ongoing commitment to your health goals. Please review the following plan we discussed and let me know if I can assist you in the future.   Screening recommendations/referrals: Colonoscopy: No longer required Mammogram: Done 11/07/2019 - Repeat annually  Bone Density: Done 08/15/2010 - Repeat every 2 years (do this at next visit) Recommended yearly ophthalmology/optometry visit for glaucoma screening and checkup Recommended yearly dental visit for hygiene and checkup  Vaccinations: Influenza vaccine: Due every fall Pneumococcal vaccine: Done 07/07/2017 & 01/28/2016 Tdap vaccine: Done 09/20/2020 - Repeat in 10 years  Shingles vaccine: First dose done 09/20/20, Get 2nd dose in 2-6 months   Covid-19:Declined  Advanced directives: Advance directive discussed with you today. I have provided a copy for you to complete at home and have notarized. Once this is complete please bring a copy in to our office so we can scan it into your chart.   Conditions/risks identified: Aim for 30 minutes of exercise or brisk walking each day, drink 6-8 glasses of water and eat lots of fruits and vegetables.   Next appointment: Follow up in one year for your annual wellness visit    Preventive Care 78 Years and Older, Female Preventive care refers to lifestyle choices and visits with your health care provider that can promote health and wellness. What does preventive care include? A yearly physical exam. This is also called an annual well check. Dental exams once or twice a year. Routine eye exams. Ask your health care provider how often you should have your eyes checked. Personal lifestyle choices, including: Daily care of your teeth and gums. Regular physical activity. Eating a healthy diet. Avoiding tobacco and drug use. Limiting alcohol use. Practicing safe sex. Taking low-dose aspirin every day. Taking  vitamin and mineral supplements as recommended by your health care provider. What happens during an annual well check? The services and screenings done by your health care provider during your annual well check will depend on your age, overall health, lifestyle risk factors, and family history of disease. Counseling  Your health care provider may ask you questions about your: Alcohol use. Tobacco use. Drug use. Emotional well-being. Home and relationship well-being. Sexual activity. Eating habits. History of falls. Memory and ability to understand (cognition). Work and work Statistician. Reproductive health. Screening  You may have the following tests or measurements: Height, weight, and BMI. Blood pressure. Lipid and cholesterol levels. These may be checked every 5 years, or more frequently if you are over 40 years old. Skin check. Lung cancer screening. You may have this screening every year starting at age 11 if you have a 30-pack-year history of smoking and currently smoke or have quit within the past 15 years. Fecal occult blood test (FOBT) of the stool. You may have this test every year starting at age 78. Flexible sigmoidoscopy or colonoscopy. You may have a sigmoidoscopy every 5 years or a colonoscopy every 10 years starting at age 78. Hepatitis C blood test. Hepatitis B blood test. Sexually transmitted disease (STD) testing. Diabetes screening. This is done by checking your blood sugar (glucose) after you have not eaten for a while (fasting). You may have this done every 1-3 years. Bone density scan. This is done to screen for osteoporosis. You may have this done starting at age 43. Mammogram. This may be done every 1-2 years. Talk to your health care provider about how often you should have  regular mammograms. Talk with your health care provider about your test results, treatment options, and if necessary, the need for more tests. Vaccines  Your health care provider may  recommend certain vaccines, such as: Influenza vaccine. This is recommended every year. Tetanus, diphtheria, and acellular pertussis (Tdap, Td) vaccine. You may need a Td booster every 10 years. Zoster vaccine. You may need this after age 61. Pneumococcal 13-valent conjugate (PCV13) vaccine. One dose is recommended after age 78. Pneumococcal polysaccharide (PPSV23) vaccine. One dose is recommended after age 78. Talk to your health care provider about which screenings and vaccines you need and how often you need them. This information is not intended to replace advice given to you by your health care provider. Make sure you discuss any questions you have with your health care provider. Document Released: 03/23/2015 Document Revised: 11/14/2015 Document Reviewed: 12/26/2014 Elsevier Interactive Patient Education  2017 [employer] Prevention in the Home Falls can cause injuries. They can happen to people of all ages. There are many things you can do to make your home safe and to help prevent falls. What can I do on the outside of my home? Regularly fix the edges of walkways and driveways and fix any cracks. Remove anything that might make you trip as you walk through a door, such as a raised step or threshold. Trim any bushes or trees on the path to your home. Use bright outdoor lighting. Clear any walking paths of anything that might make someone trip, such as rocks or tools. Regularly check to see if handrails are loose or broken. Make sure that both sides of any steps have handrails. Any raised decks and porches should have guardrails on the edges. Have any leaves, snow, or ice cleared regularly. Use sand or salt on walking paths during winter. Clean up any spills in your garage right away. This includes oil or grease spills. What can I do in the bathroom? Use night lights. Install grab bars by the toilet and in the tub and shower. Do not use towel bars as grab bars. Use non-skid  mats or decals in the tub or shower. If you need to sit down in the shower, use a plastic, non-slip stool. Keep the floor dry. Clean up any water that spills on the floor as soon as it happens. Remove soap buildup in the tub or shower regularly. Attach bath mats securely with double-sided non-slip rug tape. Do not have throw rugs and other things on the floor that can make you trip. What can I do in the bedroom? Use night lights. Make sure that you have a light by your bed that is easy to reach. Do not use any sheets or blankets that are too big for your bed. They should not hang down onto the floor. Have a firm chair that has side arms. You can use this for support while you get dressed. Do not have throw rugs and other things on the floor that can make you trip. What can I do in the kitchen? Clean up any spills right away. Avoid walking on wet floors. Keep items that you use a lot in easy-to-reach places. If you need to reach something above you, use a strong step stool that has a grab bar. Keep electrical cords out of the way. Do not use floor polish or wax that makes floors slippery. If you must use wax, use non-skid floor wax. Do not have throw rugs and other things on the floor that  can make you trip. What can I do with my stairs? Do not leave any items on the stairs. Make sure that there are handrails on both sides of the stairs and use them. Fix handrails that are broken or loose. Make sure that handrails are as long as the stairways. Check any carpeting to make sure that it is firmly attached to the stairs. Fix any carpet that is loose or worn. Avoid having throw rugs at the top or bottom of the stairs. If you do have throw rugs, attach them to the floor with carpet tape. Make sure that you have a light switch at the top of the stairs and the bottom of the stairs. If you do not have them, ask someone to add them for you. What else can I do to help prevent falls? Wear shoes  that: Do not have high heels. Have rubber bottoms. Are comfortable and fit you well. Are closed at the toe. Do not wear sandals. If you use a stepladder: Make sure that it is fully opened. Do not climb a closed stepladder. Make sure that both sides of the stepladder are locked into place. Ask someone to hold it for you, if possible. Clearly mark and make sure that you can see: Any grab bars or handrails. First and last steps. Where the edge of each step is. Use tools that help you move around (mobility aids) if they are needed. These include: Canes. Walkers. Scooters. Crutches. Turn on the lights when you go into a dark area. Replace any light bulbs as soon as they burn out. Set up your furniture so you have a clear path. Avoid moving your furniture around. If any of your floors are uneven, fix them. If there are any pets around you, be aware of where they are. Review your medicines with your doctor. Some medicines can make you feel dizzy. This can increase your chance of falling. Ask your doctor what other things that you can do to help prevent falls. This information is not intended to replace advice given to you by your health care provider. Make sure you discuss any questions you have with your health care provider. Document Released: 12/21/2008 Document Revised: 08/02/2015 Document Reviewed: 03/31/2014 Elsevier Interactive Patient Education  2017 [employer].

## 2020-10-02 ENCOUNTER — Other Ambulatory Visit: Payer: Self-pay | Admitting: Family

## 2020-10-02 DIAGNOSIS — E7800 Pure hypercholesterolemia, unspecified: Secondary | ICD-10-CM

## 2020-10-02 DIAGNOSIS — Z1231 Encounter for screening mammogram for malignant neoplasm of breast: Secondary | ICD-10-CM

## 2020-10-03 ENCOUNTER — Other Ambulatory Visit: Payer: Self-pay | Admitting: *Deleted

## 2020-10-03 MED ORDER — CLOPIDOGREL BISULFATE 75 MG PO TABS: 75.0000 mg | ORAL_TABLET | Freq: Every day | ORAL | 0 refills | Status: DC

## 2020-10-04 NOTE — Telephone Encounter (Signed)
Lmtcb.

## 2020-10-08 MED ORDER — LOSARTAN POTASSIUM-HCTZ 50-12.5 MG PO TABS: 1.0000 | ORAL_TABLET | Freq: Every day | ORAL | 1 refills | Status: DC

## 2020-10-10 ENCOUNTER — Ambulatory Visit: Payer: Medicare HMO | Admitting: Internal Medicine

## 2020-10-11 ENCOUNTER — Other Ambulatory Visit: Payer: Self-pay

## 2020-10-11 ENCOUNTER — Ambulatory Visit (INDEPENDENT_AMBULATORY_CARE_PROVIDER_SITE_OTHER): Payer: Medicare (Managed Care) | Admitting: Internal Medicine

## 2020-10-11 ENCOUNTER — Telehealth: Payer: Self-pay | Admitting: *Deleted

## 2020-10-11 ENCOUNTER — Encounter: Payer: Self-pay | Admitting: Internal Medicine

## 2020-10-11 VITALS — BP 163/76 | HR 79 | Temp 97.8°F | Ht 65.0 in | Wt 147.6 lb

## 2020-10-11 DIAGNOSIS — I129 Hypertensive chronic kidney disease with stage 1 through stage 4 chronic kidney disease, or unspecified chronic kidney disease: Secondary | ICD-10-CM | POA: Diagnosis not present

## 2020-10-11 DIAGNOSIS — K219 Gastro-esophageal reflux disease without esophagitis: Secondary | ICD-10-CM | POA: Diagnosis not present

## 2020-10-11 DIAGNOSIS — N189 Chronic kidney disease, unspecified: Secondary | ICD-10-CM | POA: Diagnosis not present

## 2020-10-11 DIAGNOSIS — R809 Proteinuria, unspecified: Secondary | ICD-10-CM | POA: Diagnosis not present

## 2020-10-11 DIAGNOSIS — E1129 Type 2 diabetes mellitus with other diabetic kidney complication: Secondary | ICD-10-CM | POA: Diagnosis not present

## 2020-10-11 DIAGNOSIS — E611 Iron deficiency: Secondary | ICD-10-CM

## 2020-10-11 DIAGNOSIS — N281 Cyst of kidney, acquired: Secondary | ICD-10-CM | POA: Diagnosis not present

## 2020-10-11 DIAGNOSIS — E1122 Type 2 diabetes mellitus with diabetic chronic kidney disease: Secondary | ICD-10-CM | POA: Diagnosis not present

## 2020-10-11 DIAGNOSIS — R768 Other specified abnormal immunological findings in serum: Secondary | ICD-10-CM | POA: Diagnosis not present

## 2020-10-11 NOTE — Telephone Encounter (Signed)
Sent request to Cardiology to hold Plavix x 5 days.

## 2020-10-11 NOTE — Patient Instructions (Signed)
For your newly found iron deficiency, we will plan on colonoscopy to further evaluate.  We will reach out to Dr. Gwenlyn Found to ask if you would be okay to hold your Plavix for 5 days prior to procedure.  Further recommendations to follow.  Continue on iron therapy.  At [facility] we value your feedback. You may receive a survey about your visit today. Please share your experience as we strive to create trusting relationships with our patients to provide genuine, compassionate, quality care.  We appreciate your understanding and patience as we review any laboratory studies, imaging, and other diagnostic tests that are ordered as we care for you. Our office policy is 5 business days for review of these results, and any emergent or urgent results are addressed in a timely manner for your best interest. If you do not hear from our office in 1 week, please contact us.   We also encourage the use of MyChart, which contains your medical information for your review as well. If you are not enrolled in this feature, an access code is on this after visit summary for your convenience. Thank you for allowing [place] to be involved in your care.  It was great to see you today!  I hope you have a great rest of your summer!!    Jessica Alas. Abbey Chatters, D.O. Gastroenterology and Hepatology [facility]

## 2020-10-11 NOTE — Progress Notes (Signed)
Primary Care Physician:  Sharion Balloon, FNP Primary Gastroenterologist:  Dr. Abbey Chatters  Chief Complaint  Patient presents with   Anemia    No complaints. Sometimes forgets to take iron daily.    HPI:   Jessica Torres is a 80 y.o. female who presents to the clinic today by referral from her nephrologist Dr. Theador Hawthorne for evaluation.  She has chronic kidney disease with newly found iron deficiency.  Hemoglobin stable at 12.4.  Denies any melena hematochezia.  No unintentional weight loss.  No family history of colorectal malignancy.  States her last colonoscopy was many years ago.  Has only had 1.  Believes she had polyps at that time but is unsure.  I do not have access to this report.  Was started on oral iron but states she is noncompliant with this.  She states she forgets to take it every day.  Does have a history of significant peripheral vascular disease on aspirin and Plavix.  No significant coronary artery disease that I can see.  Does have a history of stroke in the past.  Also has chronic reflux which is well controlled on pantoprazole.  Denies any dysphagia, odynophagia, epigastric pain, breakthrough symptoms  Past Medical History:  Diagnosis Date   Arthritis    "fingers probably" (10/30/2016)   Bulging lumbar disc    Chronic lower back pain    Difficulty urinating    GERD (gastroesophageal reflux disease)    Heart murmur    hx   Hyperlipidemia    statin intolerant   Hypertension    Hypothyroidism    PAD (peripheral artery disease) (HCC)    Peripheral arterial disease (HCC)    Shortness of breath dyspnea    Sleep apnea    "dx'd years ago; had severe problems; all of the sudden it just went away" (07/06/2014) *still gone" (10/30/2016)   Stroke [facility]) ~ 2010   denies residual on 10/30/2016   Type 2 diabetes mellitus (HCC)    Urgency of urination    Uterine cancer [facility])     Past Surgical History:  Procedure Laterality Date   ANGIOPLASTY Right 01/22/2015   w/ atherectomy  R sfa   BALLOON ANGIOPLASTY, ARTERY Right    SFA/notes 07/06/2014   BREAST EXCISIONAL BIOPSY Left    BREAST SURGERY     CARPAL TUNNEL RELEASE Right 1980's   DILATION AND CURETTAGE OF UTERUS     HEMORRHOID SURGERY     "dr lanced it; in dr's office"   INCISION AND DRAINAGE BREAST ABSCESS Left    LOWER EXTREMITY ANGIOGRAM N/A 07/06/2014   Procedure: LOWER EXTREMITY ANGIOGRAM & R-SFA atherectomy and balloon angioplasty;  Surgeon: Lorretta Harp, MD;  Location: [facility] CATH LAB;  Service: Cardiovascular;  Laterality: R   LOWER EXTREMITY ANGIOGRAM  10/30/2016   LOWER EXTREMITY ANGIOGRAPHY N/A 10/30/2016   Procedure: Lower Extremity Angiography;  Surgeon: Lorretta Harp, MD;  Location: [place] CV LAB;  Service: Cardiovascular;  Laterality: N/A;   PERIPHERAL VASCULAR CATHETERIZATION N/A 08/17/2014   Procedure: Lower Extremity Angiography;  Surgeon: Lorretta Harp, MD;  Location: [place] CV LAB;  Service: Cardiovascular;  Laterality: N/A;   PERIPHERAL VASCULAR CATHETERIZATION Left 08/17/2014   Procedure: Peripheral Vascular Atherectomy and Viabahn stent L-SFA;  Surgeon: Lorretta Harp, MD; Laterality: Left; L-SFA   PERIPHERAL VASCULAR CATHETERIZATION N/A 01/11/2015   Procedure: Lower Extremity Angiography;  Surgeon: Lorretta Harp, MD;  Location: [place] CV LAB;  Service: Cardiovascular;  Laterality: N/A;  PERIPHERAL VASCULAR CATHETERIZATION Right 01/22/2015   Procedure: Peripheral Vascular Atherectomy;  Surgeon: Lorretta Harp, MD;  Location: [place] CV LAB;  Service: Cardiovascular;  Laterality: Right;   TUBAL LIGATION     VAGINAL HYSTERECTOMY      Current Outpatient Medications  Medication Sig Dispense Refill   ACCU-CHEK AVIVA PLUS test strip TEST BLOOD SUGAR FOUR TIMES DAILY 400 strip 11   Accu-Chek Softclix Lancets lancets Use as instructed to test BG up to 3 times daily 300 each 12   Alcohol Swabs (B-D SINGLE USE SWABS REGULAR) PADS Test BS 4 times daily Dx E11.9 400 each  3   BD PEN NEEDLE NANO 2ND GEN 32G X 4 MM MISC Inject 1 application into the skin in the morning and at bedtime. 100 each 6   clopidogrel (PLAVIX) 75 MG tablet Take 1 tablet (75 mg total) by mouth daily. 90 tablet 0   empagliflozin (JARDIANCE) 25 MG TABS tablet Take 25 mg by mouth daily.     ergocalciferol (VITAMIN D2) 1.25 MG (50000 UT) capsule Take 1 capsule (50,000 Units total) by mouth once a week. No specific day 12 capsule 3   ezetimibe (ZETIA) 10 MG tablet TAKE 1 TABLET EVERY DAY 90 tablet 1   fenofibrate 160 MG tablet TAKE 1 TABLET EVERY DAY 90 tablet 0   ferrous sulfate 325 (65 FE) MG EC tablet Take by mouth.     insulin aspart (NOVOLOG FLEXPEN) 100 UNIT/ML FlexPen Inject 10-20 Units into the skin 3 (three) times daily with meals. 36 each 11   insulin glargine, 2 Unit Dial, (TOUJEO MAX SOLOSTAR) 300 UNIT/ML Solostar Pen Inject 52 Units into the skin daily. 12 mL 2   levothyroxine (SYNTHROID) 88 MCG tablet Take 1 tablet (88 mcg total) by mouth daily before breakfast. 90 tablet 3   losartan-hydrochlorothiazide (HYZAAR) 50-12.5 MG tablet Take 1 tablet by mouth daily. Take by mouth. 90 tablet 1   pantoprazole (PROTONIX) 40 MG tablet Take 1 tablet (40 mg total) by mouth daily. 90 tablet 3   cilostazol (PLETAL) 50 MG tablet Take 1 tablet (50 mg total) by mouth 2 (two) times daily. (Patient not taking: No sig reported) 180 tablet 3   No current facility-administered medications for this visit.    Allergies as of 10/11/2020 - Review Complete 10/11/2020  Allergen Reaction Noted   Morphine and related Swelling and Other (See Comments) 06/14/2014   Latex Rash 07/06/2014   Statins Other (See Comments) 08/02/2014    Family History  Problem Relation Age of Onset   Heart attack Mother    Heart disease Mother    Arrhythmia Mother        has PPM   Heart disease Father    Diabetes Sister     Social History   Socioeconomic History   Marital status: Divorced    Spouse name: Not on file    Number of children: 4   Years of education: Not on file   Highest education level: Not on file  Occupational History   Occupation: retired  Tobacco Use   Smoking status: Never   Smokeless tobacco: Never  Vaping Use   Vaping Use: Never used  Substance and Sexual Activity   Alcohol use: Yes    Alcohol/week: 0.0 standard drinks    Comment: 10/30/2016  "margarita q  2 wks or so"   Drug use: No   Sexual activity: Never  Other Topics Concern   Not on file  Social History Narrative  2 sons and their sons are living with her right now   Another son lives next door and daughter lives in [place] Determinants of Health   Financial Resource Strain: Low Risk    Difficulty of Paying Living Expenses: Not hard at all  Food Insecurity: No Food Insecurity   Worried About Charity fundraiser in the Last Year: Never true   Arboriculturist in the Last Year: Never true  Transportation Needs: No Transportation Needs   Lack of Transportation (Medical): No   Lack of Transportation (Non-Medical): No  Physical Activity: Inactive   Days of Exercise per Week: 0 days   Minutes of Exercise per Session: 0 min  Stress: Stress Concern Present   Feeling of Stress : To some extent  Social Connections: Moderately Integrated   Frequency of Communication with Friends and Family: More than three times a week   Frequency of Social Gatherings with Friends and Family: More than three times a week   Attends Religious Services: More than 4 times per year   Active Member of [employer] or Organizations: Yes   Attends Music therapist: More than 4 times per year   Marital Status: Divorced  Human resources officer Violence: Not At Risk   Fear of Current or Ex-Partner: No   Emotionally Abused: No   Physically Abused: No   Sexually Abused: No    Subjective: Review of Systems  Constitutional:  Negative for chills and fever.  HENT:  Negative for congestion and hearing loss.   Eyes:  Negative for  blurred vision and double vision.  Respiratory:  Negative for cough and shortness of breath.   Cardiovascular:  Negative for chest pain and palpitations.  Gastrointestinal:  Negative for abdominal pain, blood in stool, constipation, diarrhea, heartburn, melena and vomiting.  Genitourinary:  Negative for dysuria and urgency.  Musculoskeletal:  Negative for joint pain and myalgias.  Skin:  Negative for itching and rash.  Neurological:  Negative for dizziness and headaches.  Psychiatric/Behavioral:  Negative for depression. The patient is not nervous/anxious.       Objective: BP (!) 163/76   Pulse 79   Temp 97.8 F (36.6 C)   Ht 5\' 5"  (1.651 m)   Wt 147 lb 9.6 oz (67 kg)   BMI 24.56 kg/m  Physical Exam Constitutional:      Appearance: Normal appearance.  HENT:     Head: Normocephalic and atraumatic.  Eyes:     Extraocular Movements: Extraocular movements intact.     Conjunctiva/sclera: Conjunctivae normal.  Cardiovascular:     Rate and Rhythm: Normal rate and regular rhythm.  Pulmonary:     Effort: Pulmonary effort is normal.     Breath sounds: Normal breath sounds.  Abdominal:     General: Bowel sounds are normal.     Palpations: Abdomen is soft.  Musculoskeletal:        General: No swelling. Normal range of motion.     Cervical back: Normal range of motion and neck supple.  Skin:    General: Skin is warm and dry.     Coloration: Skin is not jaundiced.  Neurological:     General: No focal deficit present.     Mental Status: She is alert and oriented to person, place, and time.  Psychiatric:        Mood and Affect: Mood normal.        Behavior: Behavior normal.     Assessment: *GERD-well-controlled on pantoprazole *  Iron deficiency  Plan: GERD well-controlled on pantoprazole.  We will continue.  No alarm symptoms today to warrant upper endoscopy.  For her newfound iron deficiency, Will schedule for colonoscopy.The risks including infection, bleed, or perforation  as well as benefits, limitations, alternatives and imponderables have been reviewed with the patient. Questions have been answered. All parties agreeable.  We will reach out to her cardiologist for their blessing and holding her Plavix x5 days.  Appreciate Dr. Kennon Holter help immensely in regards to this patient.  Thank you Dr. Theador Hawthorne for the kind referral  10/11/2020 1:47 PM   Disclaimer: This note was dictated with voice recognition software. Similar sounding words can inadvertently be transcribed and may not be corrected upon review.

## 2020-10-11 NOTE — Telephone Encounter (Signed)
Dr. Gwenlyn Found  This patient is to undergo colonoscopy and GI team asking to hold Plavix for 5 days prior. She has a hx of peripheral arterial disease with known occlusion of bilateral SFA. She has had multiple peripheral interventions per chart review. You last saw her 07/2019 and she was started on Pletal. Also with a hx of uncontrolled HLD and HTN.   Can you send your holding recommendations to the pre-op pool?  Thank you

## 2020-10-11 NOTE — Telephone Encounter (Signed)
Attention: Preop   We would like obtain cardiac clearance for this patient please.  Procedure: Colonoscopy with Propofol  Date: TBD  Medication to hold: Plavix x 5 days  Surgeon: Dr. Abbey Chatters  Phone: [phone]  Fax:  [phone]  Type of Anesthesia: Propofol (ASA III)

## 2020-10-12 NOTE — Telephone Encounter (Signed)
    Patient Name: Jessica Torres  DOB: 03-31-1940 MRN: [id number]  Primary Cardiologist: Dr. Gwenlyn Found, MD   Chart reviewed as part of pre-operative protocol coverage. Given past medical history and time since last visit, based on ACC/AHA guidelines, Lilyahna Sirmon Folz would be at acceptable risk for the planned procedure without further cardiovascular testing.   Patient was recently seen in follow up 07/25/20. Per Dr. Gwenlyn Found, patient may hold Plavix 3-5 days prior to procedure and resume thereafter with the guidance of procedural team.   The patient was advised that if she develops new symptoms prior to surgery to contact our office to arrange for a follow-up visit, and she verbalized understanding.  I will route this recommendation to the requesting party via Epic fax function and remove from pre-op pool.  Please call with questions.  Kathyrn Drown, NP 10/12/2020, 7:45 AM

## 2020-10-16 ENCOUNTER — Encounter: Payer: Self-pay | Admitting: *Deleted

## 2020-10-16 MED ORDER — NA SULFATE-K SULFATE-MG SULF 17.5-3.13-1.6 GM/177ML PO SOLN: 1.0000 | Freq: Once | ORAL | 0 refills | Status: AC

## 2020-10-16 NOTE — Telephone Encounter (Signed)
Pt approved to hold Plavix 3 to 5 days prior to procedure.

## 2020-10-16 NOTE — Addendum Note (Signed)
Addended by: Cheron Every on: 10/16/2020 08:49 AM   Modules accepted: Orders

## 2020-10-16 NOTE — Telephone Encounter (Signed)
Called pt. She has been scheduled for TCS with propofol, asa 3 Dr. Abbey Chatters on 9/9 at 9:00am. Aware will mail prep instructions with pre-op appt. She requested a low volume prep. Reports can't drink the gallon jug. Advised will send in but will need to check with pharmacy regarding price. Aware to hold plavix 5 days, iron x 7 days.

## 2020-10-17 ENCOUNTER — Other Ambulatory Visit: Payer: Self-pay | Admitting: *Deleted

## 2020-10-17 DIAGNOSIS — E1159 Type 2 diabetes mellitus with other circulatory complications: Secondary | ICD-10-CM

## 2020-10-17 DIAGNOSIS — E7800 Pure hypercholesterolemia, unspecified: Secondary | ICD-10-CM

## 2020-10-17 MED ORDER — NOVOLOG FLEXPEN 100 UNIT/ML ~~LOC~~ SOPN: 10.0000 [IU] | PEN_INJECTOR | Freq: Three times a day (TID) | SUBCUTANEOUS | 0 refills | Status: DC

## 2020-10-17 MED ORDER — EZETIMIBE 10 MG PO TABS: 10.0000 mg | ORAL_TABLET | Freq: Every day | ORAL | 1 refills | Status: DC

## 2020-10-17 MED ORDER — CLOPIDOGREL BISULFATE 75 MG PO TABS: 75.0000 mg | ORAL_TABLET | Freq: Every day | ORAL | 0 refills | Status: DC

## 2020-10-17 MED ORDER — FENOFIBRATE 160 MG PO TABS: 160.0000 mg | ORAL_TABLET | Freq: Every day | ORAL | 1 refills | Status: DC

## 2020-10-17 MED ORDER — LEVOTHYROXINE SODIUM 88 MCG PO TABS: 88.0000 ug | ORAL_TABLET | Freq: Every day | ORAL | 1 refills | Status: DC

## 2020-10-17 MED ORDER — TOUJEO MAX SOLOSTAR 300 UNIT/ML ~~LOC~~ SOPN: 52.0000 [IU] | PEN_INJECTOR | Freq: Every day | SUBCUTANEOUS | 0 refills | Status: DC

## 2020-10-17 MED ORDER — LOSARTAN POTASSIUM-HCTZ 50-12.5 MG PO TABS: 1.0000 | ORAL_TABLET | Freq: Every day | ORAL | 1 refills | Status: DC

## 2020-10-17 MED ORDER — PANTOPRAZOLE SODIUM 40 MG PO TBEC: 40.0000 mg | DELAYED_RELEASE_TABLET | Freq: Every day | ORAL | 1 refills | Status: DC

## 2020-10-19 ENCOUNTER — Telehealth: Payer: Self-pay | Admitting: Family Medicine

## 2020-10-19 MED ORDER — INSULIN LISPRO (1 UNIT DIAL) 100 UNIT/ML (KWIKPEN): 10.0000 [IU] | PEN_INJECTOR | Freq: Three times a day (TID) | SUBCUTANEOUS | 11 refills | Status: DC

## 2020-10-19 NOTE — Telephone Encounter (Signed)
Novolog changed to Humalog per insurance request.

## 2020-10-22 ENCOUNTER — Ambulatory Visit: Payer: Medicare (Managed Care) | Admitting: Family

## 2020-10-23 ENCOUNTER — Ambulatory Visit: Payer: Medicare (Managed Care) | Admitting: Pharmacist

## 2020-10-23 ENCOUNTER — Telehealth: Payer: Self-pay | Admitting: Family

## 2020-11-06 ENCOUNTER — Ambulatory Visit: Payer: Medicare HMO | Admitting: Cardiovascular Disease

## 2020-11-07 ENCOUNTER — Telehealth: Payer: Self-pay | Admitting: Internal Medicine

## 2020-11-07 NOTE — Telephone Encounter (Signed)
Tried to call pt, LMOVM for return call. 

## 2020-11-07 NOTE — Telephone Encounter (Signed)
Pre-op appt 11/22/20 at 10:30am. Appt letter mailed with new procedure instructions.

## 2020-11-07 NOTE — Telephone Encounter (Signed)
Patient called to reschedule her procedure and to make sure it was known that she is a diabetic and had some concerns about going with out food to prep

## 2020-11-07 NOTE — Telephone Encounter (Signed)
Pt called office, TCS rescheduled to 11/26/20 at 12:45pm. Pt concerned because she is diabetic and can't eat prior to procedure. Wanted to know if Dr. Abbey Chatters knew she was diabetic. Informed pt her diabetic medication adjustments are listed on her instructions and this will help blood sugars dropping too low. Advised her she may want to do some sugary clear liquids to help blood sugars from dropping. Verbalized understanding. Endo scheduler informed.

## 2020-11-13 ENCOUNTER — Encounter (HOSPITAL_COMMUNITY): Admission: RE | Admit: 2020-11-13 | Payer: Medicare (Managed Care) | Source: Ambulatory Visit

## 2020-11-19 NOTE — Patient Instructions (Signed)
Jessica Torres  11/19/2020     @PREFPERIOPPHARMACY @   Your procedure is scheduled on  11/26/2020.   Report to [facility] at  [place] A.M.   Call this number if you have problems the morning of surgery:  [phone]   Remember:  Follow the diet and prep instructions given to you by the office.      Your last dose of iron should have been 9/11 and your last dose of plavix should have been 11/20/2020.    11/25/2020 take 1/2 dose of your jardiance and your night time insulin (26 units).          Take these medicines the morning of surgery with A SIP OF WATER                           levothyroxine, protonix.     Do not wear jewelry, make-up or nail polish.  Do not wear lotions, powders, or perfumes, or deodorant.  Do not shave 48 hours prior to surgery.  Men may shave face and neck.  Do not bring valuables to the hospital.  [facility] is not responsible for any belongings or valuables.  Contacts, dentures or bridgework may not be worn into surgery.  Leave your suitcase in the car.  After surgery it may be brought to your room.  For patients admitted to the hospital, discharge time will be determined by your treatment team.  Patients discharged the day of surgery will not be allowed to drive home and must have someone with them for 24 hours.    Special instructions:   DO NOT smoke tobacco or vape fore 24 hours before your procedure.  Please read over the following fact sheets that you were given. Anesthesia Post-op Instructions and Care and Recovery After Surgery      Colonoscopy, Adult, Care After This sheet gives you information about how to care for yourself after your procedure. Your health care provider may also give you more specific instructions. If you have problems or questions, contact your health care provider. What can I expect after the procedure? After the procedure, it is common to have: A small amount of blood in your stool for 24 hours after the  procedure. Some gas. Mild cramping or bloating of your abdomen. Follow these instructions at home: Eating and drinking  Drink enough fluid to keep your urine pale yellow. Follow instructions from your health care provider about eating or drinking restrictions. Resume your normal diet as instructed by your health care provider. Avoid heavy or fried foods that are hard to digest. Activity Rest as told by your health care provider. Avoid sitting for a long time without moving. Get up to take short walks every 1-2 hours. This is important to improve blood flow and breathing. Ask for help if you feel weak or unsteady. Return to your normal activities as told by your health care provider. Ask your health care provider what activities are safe for you. Managing cramping and bloating  Try walking around when you have cramps or feel bloated. Apply heat to your abdomen as told by your health care provider. Use the heat source that your health care provider recommends, such as a moist heat pack or a heating pad. Place a towel between your skin and the heat source. Leave the heat on for 20-30 minutes. Remove the heat if your skin turns bright red. This is especially important if  you are unable to feel pain, heat, or cold. You may have a greater risk of getting burned. General instructions If you were given a sedative during the procedure, it can affect you for several hours. Do not drive or operate machinery until your health care provider says that it is safe. For the first 24 hours after the procedure: Do not sign important documents. Do not drink alcohol. Do your regular daily activities at a slower pace than normal. Eat soft foods that are easy to digest. Take over-the-counter and prescription medicines only as told by your health care provider. Keep all follow-up visits as told by your health care provider. This is important. Contact a health care provider if: You have blood in your stool 2-3  days after the procedure. Get help right away if you have: More than a small spotting of blood in your stool. Large blood clots in your stool. Swelling of your abdomen. Nausea or vomiting. A fever. Increasing pain in your abdomen that is not relieved with medicine. Summary After the procedure, it is common to have a small amount of blood in your stool. You may also have mild cramping and bloating of your abdomen. If you were given a sedative during the procedure, it can affect you for several hours. Do not drive or operate machinery until your health care provider says that it is safe. Get help right away if you have a lot of blood in your stool, nausea or vomiting, a fever, or increased pain in your abdomen. This information is not intended to replace advice given to you by your health care provider. Make sure you discuss any questions you have with your health care provider. Document Revised: 02/18/2019 Document Reviewed: 09/20/2018 Elsevier Patient Education  [place] After This sheet gives you information about how to care for yourself after your procedure. Your health care provider may also give you more specific instructions. If you have problems or questions, contact your health care provider. What can I expect after the procedure? After the procedure, it is common to have: Tiredness. Forgetfulness about what happened after the procedure. Impaired judgment for important decisions. Nausea or vomiting. Some difficulty with balance. Follow these instructions at home: For the time period you were told by your health care provider:   Rest as needed. Do not participate in activities where you could fall or become injured. Do not drive or use machinery. Do not drink alcohol. Do not take sleeping pills or medicines that cause drowsiness. Do not make important decisions or sign legal documents. Do not take care of children on your own. Eating  and drinking Follow the diet that is recommended by your health care provider. Drink enough fluid to keep your urine pale yellow. If you vomit: Drink water, juice, or soup when you can drink without vomiting. Make sure you have little or no nausea before eating solid foods. General instructions Have a responsible adult stay with you for the time you are told. It is important to have someone help care for you until you are awake and alert. Take over-the-counter and prescription medicines only as told by your health care provider. If you have sleep apnea, surgery and certain medicines can increase your risk for breathing problems. Follow instructions from your health care provider about wearing your sleep device: Anytime you are sleeping, including during daytime naps. While taking prescription pain medicines, sleeping medicines, or medicines that make you drowsy. Avoid smoking. Keep all follow-up visits  as told by your health care provider. This is important. Contact a health care provider if: You keep feeling nauseous or you keep vomiting. You feel light-headed. You are still sleepy or having trouble with balance after 24 hours. You develop a rash. You have a fever. You have redness or swelling around the IV site. Get help right away if: You have trouble breathing. You have new-onset confusion at home. Summary For several hours after your procedure, you may feel tired. You may also be forgetful and have poor judgment. Have a responsible adult stay with you for the time you are told. It is important to have someone help care for you until you are awake and alert. Rest as told. Do not drive or operate machinery. Do not drink alcohol or take sleeping pills. Get help right away if you have trouble breathing, or if you suddenly become confused. This information is not intended to replace advice given to you by your health care provider. Make sure you discuss any questions you have with your  health care provider. Document Revised: 11/10/2019 Document Reviewed: 01/27/2019 Elsevier Patient Education  2022 [employer].

## 2020-11-21 ENCOUNTER — Other Ambulatory Visit: Payer: Self-pay

## 2020-11-21 ENCOUNTER — Encounter (HOSPITAL_COMMUNITY)
Admission: RE | Admit: 2020-11-21 | Discharge: 2020-11-21 | Disposition: A | Discharge status: Home / Self Care | Payer: Medicare (Managed Care) | Source: Ambulatory Visit | Attending: Internal Medicine | Admitting: Internal Medicine

## 2020-11-21 DIAGNOSIS — Z01812 Encounter for preprocedural laboratory examination: Secondary | ICD-10-CM | POA: Insufficient documentation

## 2020-11-21 LAB — CBC WITH DIFFERENTIAL/PLATELET
Abs Immature Granulocytes: 0.02 10*3/uL (ref 0.00–0.07)
Basophils Absolute: 0.1 10*3/uL (ref 0.0–0.1)
Basophils Relative: 1 %
Eosinophils Absolute: 0.3 10*3/uL (ref 0.0–0.5)
Eosinophils Relative: 4 %
HCT: 40.7 % (ref 36.0–46.0)
Hemoglobin: 13 g/dL (ref 12.0–15.0)
Immature Granulocytes: 0 %
Lymphocytes Relative: 21 %
Lymphs Abs: 1.5 10*3/uL (ref 0.7–4.0)
MCH: 30.4 pg (ref 26.0–34.0)
MCHC: 31.9 g/dL (ref 30.0–36.0)
MCV: 95.1 fL (ref 80.0–100.0)
Monocytes Absolute: 0.8 10*3/uL (ref 0.1–1.0)
Monocytes Relative: 11 %
Neutro Abs: 4.6 10*3/uL (ref 1.7–7.7)
Neutrophils Relative %: 63 %
Platelets: 292 10*3/uL (ref 150–400)
RBC: 4.28 MIL/uL (ref 3.87–5.11)
RDW: 13.8 % (ref 11.5–15.5)
WBC: 7.2 10*3/uL (ref 4.0–10.5)
nRBC: 0 % (ref 0.0–0.2)

## 2020-11-21 LAB — BASIC METABOLIC PANEL
Anion gap: 10 (ref 5–15)
BUN: 27 mg/dL — ABNORMAL HIGH (ref 8–23)
CO2: 20 mmol/L — ABNORMAL LOW (ref 22–32)
Calcium: 9.7 mg/dL (ref 8.9–10.3)
Chloride: 104 mmol/L (ref 98–111)
Creatinine, Ser: 1.52 mg/dL — ABNORMAL HIGH (ref 0.44–1.00)
GFR, Estimated: 35 mL/min — ABNORMAL LOW (ref >60)
Glucose, Bld: 339 mg/dL — ABNORMAL HIGH (ref 70–99)
Potassium: 4.8 mmol/L (ref 3.5–5.1)
Sodium: 134 mmol/L — ABNORMAL LOW (ref 135–145)

## 2020-11-21 NOTE — Pre-Procedure Instructions (Signed)
Dr Charna Elizabeth notified of glucose of 339. He wants patient to take her full dose of Insulin the night before her procedure. I called her and left her a voice mail about this. Asked [place] to call back with questions if needed.

## 2020-11-22 ENCOUNTER — Other Ambulatory Visit (HOSPITAL_COMMUNITY): Payer: Self-pay

## 2020-11-22 ENCOUNTER — Ambulatory Visit
Admission: RE | Admit: 2020-11-22 | Discharge: 2020-11-22 | Disposition: A | Discharge status: Home / Self Care | Payer: Medicare (Managed Care) | Source: Ambulatory Visit | Attending: Family | Admitting: Family

## 2020-11-22 DIAGNOSIS — Z1231 Encounter for screening mammogram for malignant neoplasm of breast: Secondary | ICD-10-CM

## 2020-11-23 ENCOUNTER — Telehealth: Payer: Self-pay

## 2020-11-23 ENCOUNTER — Other Ambulatory Visit: Payer: Self-pay | Admitting: Family

## 2020-11-23 ENCOUNTER — Encounter: Payer: Self-pay | Admitting: Family

## 2020-11-23 ENCOUNTER — Other Ambulatory Visit: Payer: Self-pay

## 2020-11-23 ENCOUNTER — Ambulatory Visit (INDEPENDENT_AMBULATORY_CARE_PROVIDER_SITE_OTHER): Payer: Medicare (Managed Care) | Admitting: Family

## 2020-11-23 ENCOUNTER — Ambulatory Visit (INDEPENDENT_AMBULATORY_CARE_PROVIDER_SITE_OTHER): Payer: Medicare (Managed Care)

## 2020-11-23 VITALS — BP 137/71 | HR 80 | Temp 97.6°F | Resp 20 | Ht 65.0 in | Wt 149.0 lb

## 2020-11-23 DIAGNOSIS — E559 Vitamin D deficiency, unspecified: Secondary | ICD-10-CM

## 2020-11-23 DIAGNOSIS — E611 Iron deficiency: Secondary | ICD-10-CM

## 2020-11-23 DIAGNOSIS — Z780 Asymptomatic menopausal state: Secondary | ICD-10-CM

## 2020-11-23 DIAGNOSIS — Z794 Long term (current) use of insulin: Secondary | ICD-10-CM | POA: Diagnosis not present

## 2020-11-23 DIAGNOSIS — I152 Hypertension secondary to endocrine disorders: Secondary | ICD-10-CM

## 2020-11-23 DIAGNOSIS — K219 Gastro-esophageal reflux disease without esophagitis: Secondary | ICD-10-CM

## 2020-11-23 DIAGNOSIS — E039 Hypothyroidism, unspecified: Secondary | ICD-10-CM

## 2020-11-23 DIAGNOSIS — L299 Pruritus, unspecified: Secondary | ICD-10-CM

## 2020-11-23 DIAGNOSIS — G8929 Other chronic pain: Secondary | ICD-10-CM

## 2020-11-23 DIAGNOSIS — I739 Peripheral vascular disease, unspecified: Secondary | ICD-10-CM | POA: Diagnosis not present

## 2020-11-23 DIAGNOSIS — N184 Chronic kidney disease, stage 4 (severe): Secondary | ICD-10-CM

## 2020-11-23 DIAGNOSIS — N644 Mastodynia: Secondary | ICD-10-CM | POA: Diagnosis not present

## 2020-11-23 DIAGNOSIS — M545 Low back pain, unspecified: Secondary | ICD-10-CM

## 2020-11-23 DIAGNOSIS — E7800 Pure hypercholesterolemia, unspecified: Secondary | ICD-10-CM | POA: Diagnosis not present

## 2020-11-23 DIAGNOSIS — E1159 Type 2 diabetes mellitus with other circulatory complications: Secondary | ICD-10-CM

## 2020-11-23 LAB — BAYER DCA HB A1C WAIVED: HB A1C (BAYER DCA - WAIVED): 7.8 % — ABNORMAL HIGH (ref 4.8–5.6)

## 2020-11-23 NOTE — Patient Instructions (Signed)
Diabetes Mellitus and Nutrition, Adult When you have diabetes, or diabetes mellitus, it is very important to have healthy eating habits because your blood sugar (glucose) levels are greatly affected by what you eat and drink. Eating healthy foods in the right amounts, at about the same times every day, can help you:  Control your blood glucose.  Lower your risk of heart disease.  Improve your blood pressure.  Reach or maintain a healthy weight. What can affect my meal plan? Every person with diabetes is different, and each person has different needs for a meal plan. Your health care provider may recommend that you work with a dietitian to make a meal plan that is best for you. Your meal plan may vary depending on factors such as:  The calories you need.  The medicines you take.  Your weight.  Your blood glucose, blood pressure, and cholesterol levels.  Your activity level.  Other health conditions you have, such as heart or kidney disease. How do carbohydrates affect me? Carbohydrates, also called carbs, affect your blood glucose level more than any other type of food. Eating carbs naturally raises the amount of glucose in your blood. Carb counting is a method for keeping track of how many carbs you eat. Counting carbs is important to keep your blood glucose at a healthy level, especially if you use insulin or take certain oral diabetes medicines. It is important to know how many carbs you can safely have in each meal. This is different for every person. Your dietitian can help you calculate how many carbs you should have at each meal and for each snack. How does alcohol affect me? Alcohol can cause a sudden decrease in blood glucose (hypoglycemia), especially if you use insulin or take certain oral diabetes medicines. Hypoglycemia can be a life-threatening condition. Symptoms of hypoglycemia, such as sleepiness, dizziness, and confusion, are similar to symptoms of having too much  alcohol.  Do not drink alcohol if: ? Your health care provider tells you not to drink. ? You are pregnant, may be pregnant, or are planning to become pregnant.  If you drink alcohol: ? Do not drink on an empty stomach. ? Limit how much you use to:  0-1 drink a day for women.  0-2 drinks a day for men. ? Be aware of how much alcohol is in your drink. In the [place] one drink equals one 12 oz bottle of beer (355 mL), one 5 oz glass of wine (148 mL), or one 1 oz glass of hard liquor (44 mL). ? Keep yourself hydrated with water, diet soda, or unsweetened iced tea.  Keep in mind that regular soda, juice, and other mixers may contain a lot of sugar and must be counted as carbs. What are tips for following this plan? Reading food labels  Start by checking the serving size on the "Nutrition Facts" label of packaged foods and drinks. The amount of calories, carbs, fats, and other nutrients listed on the label is based on one serving of the item. Many items contain more than one serving per package.  Check the total grams (g) of carbs in one serving. You can calculate the number of servings of carbs in one serving by dividing the total carbs by 15. For example, if a food has 30 g of total carbs per serving, it would be equal to 2 servings of carbs.  Check the number of grams (g) of saturated fats and trans fats in one serving. Choose foods that have   a low amount or none of these fats.  Check the number of milligrams (mg) of salt (sodium) in one serving. Most people should limit total sodium intake to less than 2,300 mg per day.  Always check the nutrition information of foods labeled as "low-fat" or "nonfat." These foods may be higher in added sugar or refined carbs and should be avoided.  Talk to your dietitian to identify your daily goals for nutrients listed on the label. Shopping  Avoid buying canned, pre-made, or processed foods. These foods tend to be high in fat, sodium, and added  sugar.  Shop around the outside edge of the grocery store. This is where you will most often find fresh fruits and vegetables, bulk grains, fresh meats, and fresh dairy. Cooking  Use low-heat cooking methods, such as baking, instead of high-heat cooking methods like deep frying.  Cook using healthy oils, such as olive, canola, or sunflower oil.  Avoid cooking with butter, cream, or high-fat meats. Meal planning  Eat meals and snacks regularly, preferably at the same times every day. Avoid going long periods of time without eating.  Eat foods that are high in fiber, such as fresh fruits, vegetables, beans, and whole grains. Talk with your dietitian about how many servings of carbs you can eat at each meal.  Eat 4-6 oz (112-168 g) of lean protein each day, such as lean meat, chicken, fish, eggs, or tofu. One ounce (oz) of lean protein is equal to: ? 1 oz (28 g) of meat, chicken, or fish. ? 1 egg. ?  cup (62 g) of tofu.  Eat some foods each day that contain healthy fats, such as avocado, nuts, seeds, and fish.   What foods should I eat? Fruits Berries. Apples. Oranges. Peaches. Apricots. Plums. Grapes. Mango. Papaya. Pomegranate. Kiwi. Cherries. Vegetables Lettuce. Spinach. Leafy greens, including kale, chard, collard greens, and mustard greens. Beets. Cauliflower. Cabbage. Broccoli. Carrots. Green beans. Tomatoes. Peppers. Onions. Cucumbers. Brussels sprouts. Grains Whole grains, such as whole-wheat or whole-grain bread, crackers, tortillas, cereal, and pasta. Unsweetened oatmeal. Quinoa. Brown or wild rice. Meats and other proteins Seafood. Poultry without skin. Lean cuts of poultry and beef. Tofu. Nuts. Seeds. Dairy Low-fat or fat-free dairy products such as milk, yogurt, and cheese. The items listed above may not be a complete list of foods and beverages you can eat. Contact a dietitian for more information. What foods should I avoid? Fruits Fruits canned with  syrup. Vegetables Canned vegetables. Frozen vegetables with butter or cream sauce. Grains Refined white flour and flour products such as bread, pasta, snack foods, and cereals. Avoid all processed foods. Meats and other proteins Fatty cuts of meat. Poultry with skin. Breaded or fried meats. Processed meat. Avoid saturated fats. Dairy Full-fat yogurt, cheese, or milk. Beverages Sweetened drinks, such as soda or iced tea. The items listed above may not be a complete list of foods and beverages you should avoid. Contact a dietitian for more information. Questions to ask a health care provider  Do I need to meet with a diabetes educator?  Do I need to meet with a dietitian?  What number can I call if I have questions?  When are the best times to check my blood glucose? Where to find more information:  American Diabetes Association: diabetes.org  Academy of Nutrition and Dietetics: www.eatright.org  National Institute of Diabetes and Digestive and Kidney Diseases: www.niddk.nih.gov  Association of Diabetes Care and Education Specialists: www.diabeteseducator.org Summary  It is important to have healthy eating   habits because your blood sugar (glucose) levels are greatly affected by what you eat and drink.  A healthy meal plan will help you control your blood glucose and maintain a healthy lifestyle.  Your health care provider may recommend that you work with a dietitian to make a meal plan that is best for you.  Keep in mind that carbohydrates (carbs) and alcohol have immediate effects on your blood glucose levels. It is important to count carbs and to use alcohol carefully. This information is not intended to replace advice given to you by your health care provider. Make sure you discuss any questions you have with your health care provider. Document Revised: 02/01/2019 Document Reviewed: 02/01/2019 Elsevier Patient Education  2021 Elsevier Inc.  

## 2020-11-23 NOTE — Progress Notes (Signed)
Subjective:    Patient ID: Jessica Torres, female    DOB: [birth year], 80 y.o.   MRN: [id number]  Chief Complaint  Patient presents with   Medical Management of Chronic Issues   Pt presents to the office today for chronic follow up. She has seen Vascular for PAD, but states she was told she no longer needed to see them.   She is followed by nephrologists for CKD.   She reports left nipple burns and itches. She states she went to get a mammogram yesterday, but was told she needed a diagnostic.   She is scheduled for colonoscopy 11/26/20.  Hypertension This is a chronic problem. The current episode started more than 1 year ago. The problem has been resolved since onset. The problem is controlled. Associated symptoms include malaise/fatigue. Pertinent negatives include no blurred vision, peripheral edema or shortness of breath. Risk factors for coronary artery disease include dyslipidemia, diabetes mellitus, obesity and sedentary lifestyle. The current treatment provides moderate improvement. Identifiable causes of hypertension include a thyroid problem.  Gastroesophageal Reflux She complains of belching and heartburn. This is a chronic problem. The current episode started more than 1 year ago. The problem occurs occasionally. Pertinent negatives include no fatigue. She has tried a PPI for the symptoms. The treatment provided moderate relief.  Thyroid Problem Presents for follow-up visit. Symptoms include diarrhea. Patient reports no constipation or fatigue. The symptoms have been stable. Her past medical history is significant for hyperlipidemia.  Diabetes She presents for her follow-up diabetic visit. She has type 2 diabetes mellitus. Associated symptoms include foot paresthesias. Pertinent negatives for diabetes include no blurred vision and no fatigue. There are no hypoglycemic complications. Symptoms are stable. Diabetic complications include peripheral neuropathy. Risk factors for coronary  artery disease include dyslipidemia, diabetes mellitus, hypertension, sedentary lifestyle and post-menopausal. She is following a generally healthy diet. Her overall blood glucose range is 140-180 mg/dl. Eye exam is not current.  Hyperlipidemia This is a chronic problem. The current episode started more than 1 year ago. The problem is controlled. Exacerbating diseases include obesity. Pertinent negatives include no shortness of breath. Current antihyperlipidemic treatment includes diet change. The current treatment provides no improvement of lipids. Risk factors for coronary artery disease include dyslipidemia, diabetes mellitus, hypertension and a sedentary lifestyle.  Back Pain This is a chronic problem. The current episode started more than 1 year ago. The problem occurs intermittently. The pain is present in the lumbar spine. The pain is at a severity of 7/10. The pain is moderate. Pertinent negatives include no fever. Risk factors include obesity. The treatment provided mild relief.  Anemia Presents for follow-up visit. Symptoms include malaise/fatigue. There has been no fever.     Review of Systems  Constitutional:  Positive for malaise/fatigue. Negative for fatigue and fever.  Eyes:  Negative for blurred vision.  Respiratory:  Negative for shortness of breath.   Gastrointestinal:  Positive for diarrhea and heartburn. Negative for constipation.  Musculoskeletal:  Positive for back pain.  All other systems reviewed and are negative.     Objective:   Physical Exam Vitals reviewed.  Constitutional:      General: She is not in acute distress.    Appearance: She is well-developed. She is obese.  HENT:     Head: Normocephalic and atraumatic.     Right Ear: Tympanic membrane normal.     Left Ear: Tympanic membrane normal.  Eyes:     Pupils: Pupils are equal, round, and reactive to light.  Neck:     Thyroid: No thyromegaly.  Cardiovascular:     Rate and Rhythm: Normal rate and regular  rhythm.     Heart sounds: Normal heart sounds. No murmur heard. Pulmonary:     Effort: Pulmonary effort is normal. No respiratory distress.     Breath sounds: Normal breath sounds. No wheezing.  Abdominal:     General: Bowel sounds are normal. There is no distension.     Palpations: Abdomen is soft.     Tenderness: There is no abdominal tenderness.  Musculoskeletal:        General: No tenderness. Normal range of motion.     Cervical back: Normal range of motion and neck supple.  Skin:    General: Skin is warm and dry.  Neurological:     Mental Status: She is alert and oriented to person, place, and time.     Cranial Nerves: No cranial nerve deficit.     Deep Tendon Reflexes: Reflexes are normal and symmetric.  Psychiatric:        Behavior: Behavior normal.        Thought Content: Thought content normal.        Judgment: Judgment normal.      BP 137/71   Pulse 80   Temp 97.6 F (36.4 C) (Temporal)   Resp 20   Ht 5\' 5"  (1.651 m)   Wt 149 lb (67.6 kg)   SpO2 99%   BMI 24.79 kg/m      Assessment & Plan:  [name] comes in today with chief complaint of Medical Management of Chronic Issues   Diagnosis and orders addressed:  1. Peripheral arterial disease - Bilateral SFA   2. Hypertension associated with diabetes ([place])  3. Gastroesophageal reflux disease without esophagitis  4. Hypothyroidism, unspecified type - TSH  5. Type 2 diabetes mellitus with other circulatory complication, with long-term current use of insulin ([facility]) - Bayer DCA Hb A1c Waived  6. Stage 4 chronic kidney disease ([place])  7. Claudication in peripheral vascular disease ([place])  8. Chronic bilateral low back pain without sciatica  9. Vitamin D deficiency  10. Iron deficiency  11. Pure hypercholesterolemia  12. Nipple pain - MM Digital Diagnostic Bilat; Future  13. Post-menopausal - DG WRFM DEXA   Labs pending Health Maintenance reviewed Diet and exercise encouraged  Follow  up plan: 3 months and keep colonoscopy appt   [name], FNP

## 2020-11-23 NOTE — Telephone Encounter (Signed)
Pt called office, she didn't want to move TCS up earlier. Endo scheduler informed.

## 2020-11-23 NOTE — Telephone Encounter (Signed)
Tried to call pt to see if she can arrive earlier for TCS 11/26/20, LMOVM for return call.

## 2020-11-24 LAB — TSH: TSH: 1.96 u[IU]/mL (ref 0.450–4.500)

## 2020-11-26 ENCOUNTER — Encounter (HOSPITAL_COMMUNITY)
Admission: RE | Disposition: A | Discharge status: Home / Self Care | Payer: Self-pay | Source: Home / Self Care | Attending: Internal Medicine

## 2020-11-26 ENCOUNTER — Ambulatory Visit (HOSPITAL_COMMUNITY): Payer: Medicare (Managed Care) | Admitting: Anesthesiology

## 2020-11-26 ENCOUNTER — Ambulatory Visit (HOSPITAL_COMMUNITY)
Admission: RE | Admit: 2020-11-26 | Discharge: 2020-11-26 | Disposition: A | Discharge status: Home / Self Care | Payer: Medicare (Managed Care) | Attending: Internal Medicine | Admitting: Internal Medicine

## 2020-11-26 ENCOUNTER — Other Ambulatory Visit: Payer: Self-pay

## 2020-11-26 ENCOUNTER — Encounter (HOSPITAL_COMMUNITY): Payer: Self-pay

## 2020-11-26 DIAGNOSIS — K648 Other hemorrhoids: Secondary | ICD-10-CM | POA: Insufficient documentation

## 2020-11-26 DIAGNOSIS — Z95820 Peripheral vascular angioplasty status with implants and grafts: Secondary | ICD-10-CM | POA: Insufficient documentation

## 2020-11-26 DIAGNOSIS — Z7984 Long term (current) use of oral hypoglycemic drugs: Secondary | ICD-10-CM | POA: Insufficient documentation

## 2020-11-26 DIAGNOSIS — Z91040 Latex allergy status: Secondary | ICD-10-CM | POA: Diagnosis not present

## 2020-11-26 DIAGNOSIS — D123 Benign neoplasm of transverse colon: Secondary | ICD-10-CM | POA: Diagnosis not present

## 2020-11-26 DIAGNOSIS — Z8719 Personal history of other diseases of the digestive system: Secondary | ICD-10-CM | POA: Insufficient documentation

## 2020-11-26 DIAGNOSIS — Z7902 Long term (current) use of antithrombotics/antiplatelets: Secondary | ICD-10-CM | POA: Diagnosis not present

## 2020-11-26 DIAGNOSIS — Z79899 Other long term (current) drug therapy: Secondary | ICD-10-CM | POA: Insufficient documentation

## 2020-11-26 DIAGNOSIS — Z885 Allergy status to narcotic agent status: Secondary | ICD-10-CM | POA: Insufficient documentation

## 2020-11-26 DIAGNOSIS — Z794 Long term (current) use of insulin: Secondary | ICD-10-CM | POA: Insufficient documentation

## 2020-11-26 DIAGNOSIS — Z955 Presence of coronary angioplasty implant and graft: Secondary | ICD-10-CM | POA: Diagnosis not present

## 2020-11-26 DIAGNOSIS — K5730 Diverticulosis of large intestine without perforation or abscess without bleeding: Secondary | ICD-10-CM | POA: Diagnosis not present

## 2020-11-26 DIAGNOSIS — I2510 Atherosclerotic heart disease of native coronary artery without angina pectoris: Secondary | ICD-10-CM | POA: Diagnosis not present

## 2020-11-26 DIAGNOSIS — Z79890 Hormone replacement therapy (postmenopausal): Secondary | ICD-10-CM | POA: Insufficient documentation

## 2020-11-26 DIAGNOSIS — Z888 Allergy status to other drugs, medicaments and biological substances status: Secondary | ICD-10-CM | POA: Diagnosis not present

## 2020-11-26 DIAGNOSIS — D509 Iron deficiency anemia, unspecified: Secondary | ICD-10-CM | POA: Diagnosis not present

## 2020-11-26 DIAGNOSIS — K635 Polyp of colon: Secondary | ICD-10-CM

## 2020-11-26 DIAGNOSIS — Z8542 Personal history of malignant neoplasm of other parts of uterus: Secondary | ICD-10-CM | POA: Diagnosis not present

## 2020-11-26 HISTORY — PX: POLYPECTOMY: SHX5525

## 2020-11-26 HISTORY — PX: COLONOSCOPY WITH PROPOFOL: SHX5780

## 2020-11-26 LAB — GLUCOSE, CAPILLARY: Glucose-Capillary: 205 mg/dL — ABNORMAL HIGH (ref 70–99)

## 2020-11-26 SURGERY — COLONOSCOPY WITH PROPOFOL
Anesthesia: General

## 2020-11-26 MED ORDER — STERILE WATER FOR IRRIGATION IR SOLN
Status: DC | PRN
  Administered 2020-11-26: 100 mL

## 2020-11-26 MED ORDER — LACTATED RINGERS IV SOLN
INTRAVENOUS | Status: DC
  Administered 2020-11-26: 1000 mL via INTRAVENOUS

## 2020-11-26 MED ORDER — LIDOCAINE HCL (CARDIAC) PF 100 MG/5ML IV SOSY
PREFILLED_SYRINGE | INTRAVENOUS | Status: DC | PRN
Start: 2020-11-26 — End: 2020-11-26
  Administered 2020-11-26: 50 mg via INTRAVENOUS

## 2020-11-26 MED ORDER — PROPOFOL 10 MG/ML IV BOLUS
INTRAVENOUS | Status: DC | PRN
  Administered 2020-11-26: 80 mg via INTRAVENOUS
  Administered 2020-11-26: 125 ug/kg/min via INTRAVENOUS

## 2020-11-26 NOTE — Op Note (Signed)
[facility] Patient Name: Jessica Torres Procedure Date: 11/26/2020 10:54 AM MRN: [id number] Date of Birth: 08-Jul-1940 Attending MD: Elon Alas. Abbey Chatters DO CSN: [id number] Age: 80 Admit Type: Outpatient Procedure:                Colonoscopy Indications:              Iron deficiency anemia Providers:                Elon Alas. Abbey Chatters, DO, Caprice Kluver, Clenton Pare, Technician Referring MD:              Medicines:                See the Anesthesia note for documentation of the                            administered medications Complications:            No immediate complications. Estimated Blood Loss:     Estimated blood loss: none. Estimated blood loss                            was minimal. Procedure:                Pre-Anesthesia Assessment:                           - The anesthesia plan was to use monitored                            anesthesia care (MAC).                           After obtaining informed consent, the colonoscope                            was passed under direct vision. Throughout the                            procedure, the patient's blood pressure, pulse, and                            oxygen saturations were monitored continuously. The                            [id number]-[id number] ([id number]) scope was introduced through                            the anus and advanced to the the cecum, identified                            by appendiceal orifice and ileocecal valve. The                            colonoscopy was performed without difficulty. The  patient tolerated the procedure well. The quality                            of the bowel preparation was evaluated using the                            BBPS [facility] Bowel Preparation Scale) with scores                            of: Right Colon = 3, Transverse Colon = 3 and Left                            Colon = 3 (entire mucosa seen well with no  residual                            staining, small fragments of stool or opaque                            liquid). The total BBPS score equals 9. Scope In: 11:20:15 AM Scope Out: 11:33:52 AM Scope Withdrawal Time: 0 hours 10 minutes 43 seconds  Total Procedure Duration: 0 hours 13 minutes 37 seconds  Findings:      The perianal and digital rectal examinations were normal.      Non-bleeding internal hemorrhoids were found during endoscopy.      Multiple small and large-mouthed diverticula were found in the sigmoid       colon and descending colon.      Four sessile polyps were found in the transverse colon. The polyps were       3 to 4 mm in size. These polyps were removed with a cold snare.       Resection and retrieval were complete.      The exam was otherwise without abnormality. Impression:               - Non-bleeding internal hemorrhoids.                           - Diverticulosis in the sigmoid colon and in the                            descending colon.                           - Four 3 to 4 mm polyps in the transverse colon,                            removed with a cold snare. Resected and retrieved.                           - The examination was otherwise normal. Moderate Sedation:      Per Anesthesia Care Recommendation:           - Patient has a contact number available for  emergencies. The signs and symptoms of potential                            delayed complications were discussed with the                            patient. Return to normal activities tomorrow.                            Written discharge instructions were provided to the                            patient.                           - Resume previous diet.                           - Continue present medications.                           - Await pathology results.                           - Discuss risks vs benfits of repeat colonoscopy in                             5 years for surveillance.                           - Return to GI clinic in 6 months. Procedure Code(s):        --- Professional ---                           [phone], Colonoscopy, flexible; with removal of                            tumor(s), polyp(s), or other lesion(s) by snare                            technique Diagnosis Code(s):        --- Professional ---                           K64.8, Other hemorrhoids                           K63.5, Polyp of colon                           D50.9, Iron deficiency anemia, unspecified                           K57.30, Diverticulosis of large intestine without                            perforation or abscess without bleeding CPT copyright  2019 American Medical Association. All rights reserved. The codes documented in this report are preliminary and upon coder review may  be revised to meet current compliance requirements. Elon Alas. Abbey Chatters, DO [place] Abbey Chatters, DO 11/26/2020 11:36:34 AM This report has been signed electronically. Number of Addenda: 0

## 2020-11-26 NOTE — Transfer of Care (Signed)
Immediate Anesthesia Transfer of Care Note  [name]  Procedure(s) Performed: COLONOSCOPY WITH PROPOFOL POLYPECTOMY  Patient Location: Short Stay  Anesthesia Type:General  Level of Consciousness: awake, alert , oriented and patient cooperative  Airway & Oxygen Therapy: Patient Spontanous Breathing  Post-op Assessment: Report given to RN, Post -op Vital signs reviewed and stable and Patient moving all extremities X 4  Post vital signs: Reviewed and stable  Last Vitals:  Vitals Value Taken Time  BP    Temp    Pulse    Resp    SpO2      Last Pain:  Vitals:   11/26/20 1116  TempSrc:   PainSc: 0-No pain         Complications: No notable events documented.

## 2020-11-26 NOTE — Anesthesia Postprocedure Evaluation (Signed)
Anesthesia Post Note  [name]  Procedure(s) Performed: COLONOSCOPY WITH PROPOFOL POLYPECTOMY  Patient location during evaluation: Phase II Anesthesia Type: General Level of consciousness: awake Pain management: pain level controlled Vital Signs Assessment: post-procedure vital signs reviewed and stable Respiratory status: spontaneous breathing and respiratory function stable Cardiovascular status: blood pressure returned to baseline and stable Postop Assessment: no headache and no apparent nausea or vomiting Anesthetic complications: no Comments: Late entry   No notable events documented.   Last Vitals:  Vitals:   11/26/20 1101 11/26/20 1143  BP: (!) 147/58 (!) 105/53  Pulse: 91 83  Resp: 19 19  Temp: 36.9 C 36.6 C  SpO2: 97% 99%    Last Pain:  Vitals:   11/26/20 1143  TempSrc: Oral  PainSc: 0-No pain                 Louann Sjogren

## 2020-11-26 NOTE — Discharge Instructions (Addendum)
  Colonoscopy Discharge Instructions  Read the instructions outlined below and refer to this sheet in the next few weeks. These discharge instructions provide you with general information on caring for yourself after you leave the hospital. Your doctor may also give you specific instructions. While your treatment has been planned according to the most current medical practices available, unavoidable complications occasionally occur.   ACTIVITY You may resume your regular activity, but move at a slower pace for the next 24 hours.  Take frequent rest periods for the next 24 hours.  Walking will help get rid of the air and reduce the bloated feeling in your belly (abdomen).  No driving for 24 hours (because of the medicine (anesthesia) used during the test).   Do not sign any important legal documents or operate any machinery for 24 hours (because of the anesthesia used during the test).  NUTRITION Drink plenty of fluids.  You may resume your normal diet as instructed by your doctor.  Begin with a light meal and progress to your normal diet. Heavy or fried foods are harder to digest and may make you feel sick to your stomach (nauseated).  Avoid alcoholic beverages for 24 hours or as instructed.  MEDICATIONS You may resume your normal medications unless your doctor tells you otherwise.  WHAT YOU CAN EXPECT TODAY Some feelings of bloating in the abdomen.  Passage of more gas than usual.  Spotting of blood in your stool or on the toilet paper.  IF YOU HAD POLYPS REMOVED DURING THE COLONOSCOPY: No aspirin products for 7 days or as instructed.  No alcohol for 7 days or as instructed.  Eat a soft diet for the next 24 hours.  FINDING OUT THE RESULTS OF YOUR TEST Not all test results are available during your visit. If your test results are not back during the visit, make an appointment with your caregiver to find out the results. Do not assume everything is normal if you have not heard from your  caregiver or the medical facility. It is important for you to follow up on all of your test results.  SEEK IMMEDIATE MEDICAL ATTENTION IF: You have more than a spotting of blood in your stool.  Your belly is swollen (abdominal distention).  You are nauseated or vomiting.  You have a temperature over 101.  You have abdominal pain or discomfort that is severe or gets worse throughout the day.   Your colonoscopy revealed 4 polyp(s) which I removed successfully. Await pathology results, my office will contact you. I recommend repeating colonoscopy in 5 years for surveillance purposes. You also have diverticulosis and internal hemorrhoids. I would recommend increasing fiber in your diet or adding OTC Benefiber/Metamucil. Be sure to drink at least 4 to 6 glasses of water daily. Follow-up with GI in 6 months    I hope you have a great rest of your week!  Elon Alas. Abbey Chatters, D.O. Gastroenterology and Hepatology [facility]

## 2020-11-26 NOTE — H&P (Signed)
Primary Care Physician:  Sharion Balloon, FNP Primary Gastroenterologist:  Dr. Abbey Chatters  Pre-Procedure History & Physical: HPI:  Jessica Torres is a 80 y.o. female is here for a colonoscopy to be performed for iron deficiency anemia.  Past Medical History:  Diagnosis Date   Arthritis    "fingers probably" (10/30/2016)   Bulging lumbar disc    Chronic lower back pain    Difficulty urinating    GERD (gastroesophageal reflux disease)    Heart murmur    hx   Hyperlipidemia    statin intolerant   Hypertension    Hypothyroidism    PAD (peripheral artery disease) (HCC)    Peripheral arterial disease (HCC)    Shortness of breath dyspnea    Sleep apnea    "dx'd years ago; had severe problems; all of the sudden it just went away" (07/06/2014) *still gone" (10/30/2016)   Stroke [facility]) ~ 2010   denies residual on 10/30/2016   Type 2 diabetes mellitus (HCC)    Urgency of urination    Uterine cancer [facility])     Past Surgical History:  Procedure Laterality Date   ANGIOPLASTY Right 01/22/2015   w/ atherectomy R sfa   BALLOON ANGIOPLASTY, ARTERY Right    SFA/notes 07/06/2014   BREAST EXCISIONAL BIOPSY Left    BREAST SURGERY     CARPAL TUNNEL RELEASE Right 1980's   DILATION AND CURETTAGE OF UTERUS     HEMORRHOID SURGERY     "dr lanced it; in dr's office"   INCISION AND DRAINAGE BREAST ABSCESS Left    LOWER EXTREMITY ANGIOGRAM N/A 07/06/2014   Procedure: LOWER EXTREMITY ANGIOGRAM & R-SFA atherectomy and balloon angioplasty;  Surgeon: Lorretta Harp, MD;  Location: [facility] CATH LAB;  Service: Cardiovascular;  Laterality: R   LOWER EXTREMITY ANGIOGRAM  10/30/2016   LOWER EXTREMITY ANGIOGRAPHY N/A 10/30/2016   Procedure: Lower Extremity Angiography;  Surgeon: Lorretta Harp, MD;  Location: [place] CV LAB;  Service: Cardiovascular;  Laterality: N/A;   PERIPHERAL VASCULAR CATHETERIZATION N/A 08/17/2014   Procedure: Lower Extremity Angiography;  Surgeon: Lorretta Harp, MD;  Location: [place]  CV LAB;  Service: Cardiovascular;  Laterality: N/A;   PERIPHERAL VASCULAR CATHETERIZATION Left 08/17/2014   Procedure: Peripheral Vascular Atherectomy and Viabahn stent L-SFA;  Surgeon: Lorretta Harp, MD; Laterality: Left; L-SFA   PERIPHERAL VASCULAR CATHETERIZATION N/A 01/11/2015   Procedure: Lower Extremity Angiography;  Surgeon: Lorretta Harp, MD;  Location: [place] CV LAB;  Service: Cardiovascular;  Laterality: N/A;   PERIPHERAL VASCULAR CATHETERIZATION Right 01/22/2015   Procedure: Peripheral Vascular Atherectomy;  Surgeon: Lorretta Harp, MD;  Location: [place] CV LAB;  Service: Cardiovascular;  Laterality: Right;   TUBAL LIGATION     VAGINAL HYSTERECTOMY      Prior to Admission medications   Medication Sig Start Date End Date Taking? Authorizing Provider  clopidogrel (PLAVIX) 75 MG tablet Take 1 tablet (75 mg total) by mouth daily. 10/17/20  Yes Hawks, Christy A, FNP  empagliflozin (JARDIANCE) 25 MG TABS tablet Take 25 mg by mouth daily.   Yes [provider]  ergocalciferol (VITAMIN D2) 1.25 MG (50000 UT) capsule Take 1 capsule (50,000 Units total) by mouth once a week. No specific day 04/26/20  Yes Hawks, Christy A, FNP  ezetimibe (ZETIA) 10 MG tablet Take 1 tablet (10 mg total) by mouth daily. 10/17/20  Yes Hawks, Christy A, FNP  fenofibrate 160 MG tablet Take 1 tablet (160 mg total) by mouth daily. 10/17/20  Yes Hawks, Christy A, FNP  ferrous sulfate 325 (65 FE) MG EC tablet Take 325 mg by mouth once a week. 07/19/20 07/19/21 Yes [provider]  insulin glargine, 2 Unit Dial, (TOUJEO MAX SOLOSTAR) 300 UNIT/ML Solostar Pen Inject 52 Units into the skin daily. Patient taking differently: Inject 52 Units into the skin at bedtime. 10/17/20  Yes Hawks, Christy A, FNP  insulin lispro (HUMALOG) 100 UNIT/ML KwikPen Inject 10-20 Units into the skin 3 (three) times daily. 10/19/20  Yes Hawks, Christy A, FNP  levothyroxine (SYNTHROID) 88 MCG tablet Take 1 tablet (88 mcg  total) by mouth daily before breakfast. 10/17/20  Yes Hawks, Christy A, FNP  losartan-hydrochlorothiazide (HYZAAR) 50-12.5 MG tablet Take 1 tablet by mouth daily. Take by mouth. 10/17/20  Yes Hawks, Christy A, FNP  pantoprazole (PROTONIX) 40 MG tablet Take 1 tablet (40 mg total) by mouth daily. 10/17/20  Yes Hawks, Alyse Low A, FNP  ACCU-CHEK AVIVA PLUS test strip TEST BLOOD SUGAR FOUR TIMES DAILY 04/18/19   Terald Sleeper, PA-C  Accu-Chek Softclix Lancets lancets Use as instructed to test BG up to 3 times daily 05/24/20   [name] A, FNP  Alcohol Swabs (B-D SINGLE USE SWABS REGULAR) PADS Test BS 4 times daily Dx E11.9 02/01/20   Sharion Balloon, FNP  BD PEN NEEDLE NANO 2ND GEN 32G X 4 MM MISC Inject 1 application into the skin in the morning and at bedtime. 01/25/20   Sharion Balloon, FNP  cilostazol (PLETAL) 50 MG tablet Take 1 tablet (50 mg total) by mouth 2 (two) times daily. Patient not taking: No sig reported 07/25/20   Lorretta Harp, MD  metFORMIN (GLUCOPHAGE) 1000 MG tablet Take 1 tablet (1,000 mg total) by mouth 2 (two) times daily with a meal. Patient taking differently: Take 1,000 mg by mouth 2 (two) times daily with a meal. Only taking once a day 04/06/19 07/13/19  Terald Sleeper, PA-C    Allergies as of 10/16/2020 - Review Complete 10/11/2020  Allergen Reaction Noted   Morphine and related Swelling and Other (See Comments) 06/14/2014   Latex Rash 07/06/2014   Statins Other (See Comments) 08/02/2014    Family History  Problem Relation Age of Onset   Heart attack Mother    Heart disease Mother    Arrhythmia Mother        has PPM   Heart disease Father    Diabetes Sister     Social History   Socioeconomic History   Marital status: Divorced    Spouse name: Not on file   Number of children: 4   Years of education: Not on file   Highest education level: Not on file  Occupational History   Occupation: retired  Tobacco Use   Smoking status: Never   Smokeless tobacco:  Never  Vaping Use   Vaping Use: Never used  Substance and Sexual Activity   Alcohol use: Yes    Alcohol/week: 0.0 standard drinks    Comment: 10/30/2016  "margarita q  2 wks or so"   Drug use: No   Sexual activity: Never  Other Topics Concern   Not on file  Social History Narrative   2 sons and their sons are living with her right now   Another son lives next door and daughter lives in [place] Determinants of Health   Financial Resource Strain: Low Risk    Difficulty of Paying Living Expenses: Not hard at all  Food Insecurity:  No Food Insecurity   Worried About Charity fundraiser in the Last Year: Never true   Ran Out of Food in the Last Year: Never true  Transportation Needs: No Transportation Needs   Lack of Transportation (Medical): No   Lack of Transportation (Non-Medical): No  Physical Activity: Inactive   Days of Exercise per Week: 0 days   Minutes of Exercise per Session: 0 min  Stress: Stress Concern Present   Feeling of Stress : To some extent  Social Connections: Moderately Integrated   Frequency of Communication with Friends and Family: More than three times a week   Frequency of Social Gatherings with Friends and Family: More than three times a week   Attends Religious Services: More than 4 times per year   Active Member of [employer] or Organizations: Yes   Attends Music therapist: More than 4 times per year   Marital Status: Divorced  Human resources officer Violence: Not At Risk   Fear of Current or Ex-Partner: No   Emotionally Abused: No   Physically Abused: No   Sexually Abused: No    Review of Systems: See HPI, otherwise negative ROS  Physical Exam: Vital signs in last 24 hours:     General:   Alert,  Well-developed, well-nourished, pleasant and cooperative in NAD Head:  Normocephalic and atraumatic. Eyes:  Sclera clear, no icterus.   Conjunctiva pink. Ears:  Normal auditory acuity. Nose:  No deformity, discharge,  or  lesions. Mouth:  No deformity or lesions, dentition normal. Neck:  Supple; no masses or thyromegaly. Lungs:  Clear throughout to auscultation.   No wheezes, crackles, or rhonchi. No acute distress. Heart:  Regular rate and rhythm; no murmurs, clicks, rubs,  or gallops. Abdomen:  Soft, nontender and nondistended. No masses, hepatosplenomegaly or hernias noted. Normal bowel sounds, without guarding, and without rebound.   Msk:  Symmetrical without gross deformities. Normal posture. Extremities:  Without clubbing or edema. Neurologic:  Alert and  oriented x4;  grossly normal neurologically. Skin:  Intact without significant lesions or rashes. Cervical Nodes:  No significant cervical adenopathy. Psych:  Alert and cooperative. Normal mood and affect.  Impression/Plan: Jessica Torres is here for a colonoscopy to be performed for iron deficiency anemia.   The risks of the procedure including infection, bleed, or perforation as well as benefits, limitations, alternatives and imponderables have been reviewed with the patient. Questions have been answered. All parties agreeable.

## 2020-11-26 NOTE — Anesthesia Preprocedure Evaluation (Signed)
Anesthesia Evaluation  Patient identified by MRN, date of birth, ID band Patient awake    Reviewed: Allergy & Precautions, H&P , NPO status , Patient's Chart, lab work & pertinent test results, reviewed documented beta blocker date and time   Airway Mallampati: II  TM Distance: >3 FB Neck ROM: full    Dental no notable dental hx.    Pulmonary shortness of breath, sleep apnea ,    Pulmonary exam normal breath sounds clear to auscultation       Cardiovascular Exercise Tolerance: Good hypertension, + CAD and + Peripheral Vascular Disease  + Valvular Problems/Murmurs  Rhythm:regular Rate:Normal     Neuro/Psych  Neuromuscular disease CVA, No Residual Symptoms negative psych ROS   GI/Hepatic Neg liver ROS, GERD  Medicated,  Endo/Other  diabetesHypothyroidism   Renal/GU negative Renal ROS  negative genitourinary   Musculoskeletal   Abdominal   Peds  Hematology negative hematology ROS (+)   Anesthesia Other Findings   Reproductive/Obstetrics negative OB ROS                             Anesthesia Physical Anesthesia Plan  ASA: 3  Anesthesia Plan: General   Post-op Pain Management:    Induction:   PONV Risk Score and Plan: Propofol infusion  Airway Management Planned:   Additional Equipment:   Intra-op Plan:   Post-operative Plan:   Informed Consent: I have reviewed the patients History and Physical, chart, labs and discussed the procedure including the risks, benefits and alternatives for the proposed anesthesia with the patient or authorized representative who has indicated his/her understanding and acceptance.     Dental Advisory Given  Plan Discussed with: CRNA  Anesthesia Plan Comments:         Anesthesia Quick Evaluation

## 2020-11-27 LAB — SURGICAL PATHOLOGY

## 2020-11-28 ENCOUNTER — Other Ambulatory Visit: Payer: Self-pay | Admitting: Family

## 2020-11-28 DIAGNOSIS — M85851 Other specified disorders of bone density and structure, right thigh: Secondary | ICD-10-CM | POA: Diagnosis not present

## 2020-11-28 DIAGNOSIS — M8580 Other specified disorders of bone density and structure, unspecified site: Secondary | ICD-10-CM

## 2020-11-28 DIAGNOSIS — Z780 Asymptomatic menopausal state: Secondary | ICD-10-CM | POA: Diagnosis not present

## 2020-11-30 ENCOUNTER — Encounter (HOSPITAL_COMMUNITY): Payer: Self-pay | Admitting: Internal Medicine

## 2020-12-03 ENCOUNTER — Other Ambulatory Visit: Payer: Self-pay | Admitting: Family

## 2020-12-03 DIAGNOSIS — L299 Pruritus, unspecified: Secondary | ICD-10-CM

## 2020-12-12 ENCOUNTER — Encounter: Payer: Self-pay | Admitting: Internal Medicine

## 2020-12-21 ENCOUNTER — Ambulatory Visit: Payer: Medicare HMO | Admitting: Family

## 2020-12-27 ENCOUNTER — Encounter: Payer: Self-pay | Admitting: Family Medicine

## 2020-12-27 ENCOUNTER — Ambulatory Visit (INDEPENDENT_AMBULATORY_CARE_PROVIDER_SITE_OTHER): Payer: Medicare (Managed Care) | Admitting: Family Medicine

## 2020-12-27 DIAGNOSIS — J0110 Acute frontal sinusitis, unspecified: Secondary | ICD-10-CM

## 2020-12-27 MED ORDER — AMOXICILLIN 875 MG PO TABS
875.0000 mg | ORAL_TABLET | Freq: Two times a day (BID) | ORAL | 0 refills | Status: AC
Start: 2020-12-27 — End: 2021-01-03

## 2020-12-27 NOTE — Progress Notes (Signed)
   Virtual Visit  Note Due to COVID-19 pandemic this visit was conducted virtually. This visit type was conducted due to national recommendations for restrictions regarding the COVID-19 Pandemic (e.g. social distancing, sheltering in place) in an effort to limit this patient's exposure and mitigate transmission in our community. All issues noted in this document were discussed and addressed.  A physical exam was not performed with this format.  I connected with Jessica Torres on 12/27/20 at 1110 by telephone and verified that I am speaking with the correct person using two identifiers. Jessica Torres is currently located at home and no one is currently with her during the visit. The provider, Gwenlyn Perking, FNP is located in their office at time of visit.  I discussed the limitations, risks, security and privacy concerns of performing an evaluation and management service by telephone and the availability of in person appointments. I also discussed with the patient that there may be a patient responsible charge related to this service. The patient expressed understanding and agreed to proceed.  CC: sinusitis  History and Present Illness:  HPI Jessica Torres reports congestion and runny nose for over 1 week. She also has a dry cough. Yesterday she developed a headache with pressure around her eyes and temples. Her headache continues today. She denies fever, chills, chest pain, shortness of breath, focal weakness, dizziness, or changes in vision. She has not tried any remedies.     ROS As per HPI.   Observations/Objective: Alert and oriented x 3. Able to speak in full sentences without difficulty.    Assessment and Plan: Jessica Torres was seen today for sinusitis.  Diagnoses and all orders for this visit:  Acute non-recurrent frontal sinusitis Amoxicillin as below. Discussed symptomatic care and return precautions.  -     amoxicillin (AMOXIL) 875 MG tablet; Take 1 tablet (875 mg total) by mouth 2  (two) times daily for 7 days.    Follow Up Instructions: As needed.     I discussed the assessment and treatment plan with the patient. The patient was provided an opportunity to ask questions and all were answered. The patient agreed with the plan and demonstrated an understanding of the instructions.   The patient was advised to call back or seek an in-person evaluation if the symptoms worsen or if the condition fails to improve as anticipated.  The above assessment and management plan was discussed with the patient. The patient verbalized understanding of and has agreed to the management plan. Patient is aware to call the clinic if symptoms persist or worsen. Patient is aware when to return to the clinic for a follow-up visit. Patient educated on when it is appropriate to go to the emergency department.   Time call ended:  1123  I provided 13 minutes of  non face-to-face time during this encounter.    Gwenlyn Perking, FNP

## 2021-01-04 ENCOUNTER — Other Ambulatory Visit: Payer: Self-pay

## 2021-01-04 ENCOUNTER — Ambulatory Visit
Admission: RE | Admit: 2021-01-04 | Discharge: 2021-01-04 | Disposition: A | Discharge status: Home / Self Care | Payer: Medicare (Managed Care) | Source: Ambulatory Visit | Attending: Family | Admitting: Family

## 2021-01-04 ENCOUNTER — Ambulatory Visit: Payer: Medicare (Managed Care)

## 2021-01-04 DIAGNOSIS — L299 Pruritus, unspecified: Secondary | ICD-10-CM

## 2021-01-04 DIAGNOSIS — N644 Mastodynia: Secondary | ICD-10-CM | POA: Diagnosis not present

## 2021-01-04 DIAGNOSIS — R922 Inconclusive mammogram: Secondary | ICD-10-CM | POA: Diagnosis not present

## 2021-02-14 ENCOUNTER — Other Ambulatory Visit: Payer: Self-pay | Admitting: Family

## 2021-02-26 ENCOUNTER — Ambulatory Visit (INDEPENDENT_AMBULATORY_CARE_PROVIDER_SITE_OTHER): Payer: Medicare (Managed Care) | Admitting: Family

## 2021-02-26 ENCOUNTER — Encounter: Payer: Self-pay | Admitting: Family

## 2021-02-26 VITALS — BP 142/64 | HR 84 | Temp 98.1°F | Ht 65.0 in | Wt 150.0 lb

## 2021-02-26 DIAGNOSIS — N184 Chronic kidney disease, stage 4 (severe): Secondary | ICD-10-CM

## 2021-02-26 DIAGNOSIS — I739 Peripheral vascular disease, unspecified: Secondary | ICD-10-CM | POA: Diagnosis not present

## 2021-02-26 DIAGNOSIS — E559 Vitamin D deficiency, unspecified: Secondary | ICD-10-CM

## 2021-02-26 DIAGNOSIS — E7800 Pure hypercholesterolemia, unspecified: Secondary | ICD-10-CM | POA: Diagnosis not present

## 2021-02-26 DIAGNOSIS — E1159 Type 2 diabetes mellitus with other circulatory complications: Secondary | ICD-10-CM

## 2021-02-26 DIAGNOSIS — M545 Low back pain, unspecified: Secondary | ICD-10-CM

## 2021-02-26 DIAGNOSIS — E611 Iron deficiency: Secondary | ICD-10-CM | POA: Diagnosis not present

## 2021-02-26 DIAGNOSIS — I152 Hypertension secondary to endocrine disorders: Secondary | ICD-10-CM

## 2021-02-26 DIAGNOSIS — K219 Gastro-esophageal reflux disease without esophagitis: Secondary | ICD-10-CM

## 2021-02-26 DIAGNOSIS — E039 Hypothyroidism, unspecified: Secondary | ICD-10-CM | POA: Diagnosis not present

## 2021-02-26 DIAGNOSIS — R5383 Other fatigue: Secondary | ICD-10-CM | POA: Diagnosis not present

## 2021-02-26 DIAGNOSIS — Z794 Long term (current) use of insulin: Secondary | ICD-10-CM

## 2021-02-26 DIAGNOSIS — J329 Chronic sinusitis, unspecified: Secondary | ICD-10-CM

## 2021-02-26 DIAGNOSIS — G8929 Other chronic pain: Secondary | ICD-10-CM | POA: Diagnosis not present

## 2021-02-26 LAB — BAYER DCA HB A1C WAIVED: HB A1C (BAYER DCA - WAIVED): 7.9 % — ABNORMAL HIGH (ref 4.8–5.6)

## 2021-02-26 MED ORDER — DOXYCYCLINE HYCLATE 100 MG PO TABS: 100.0000 mg | ORAL_TABLET | Freq: Two times a day (BID) | ORAL | 0 refills | Status: DC

## 2021-02-26 NOTE — Patient Instructions (Signed)
Health Maintenance After Age 78 After age 78, you are at a higher risk for certain long-term diseases and infections as well as injuries from falls. Falls are a major cause of broken bones and head injuries in people who are older than age 78. Getting regular preventive care can help to keep you healthy and well. Preventive care includes getting regular testing and making lifestyle changes as recommended by your health care provider. Talk with your health care provider about: Which screenings and tests you should have. A screening is a test that checks for a disease when you have no symptoms. A diet and exercise plan that is right for you. What should I know about screenings and tests to prevent falls? Screening and testing are the best ways to find a health problem early. Early diagnosis and treatment give you the best chance of managing medical conditions that are common after age 78. Certain conditions and lifestyle choices may make you more likely to have a fall. Your health care provider may recommend: Regular vision checks. Poor vision and conditions such as cataracts can make you more likely to have a fall. If you wear glasses, make sure to get your prescription updated if your vision changes. Medicine review. Work with your health care provider to regularly review all of the medicines you are taking, including over-the-counter medicines. Ask your health care provider about any side effects that may make you more likely to have a fall. Tell your health care provider if any medicines that you take make you feel dizzy or sleepy. Strength and balance checks. Your health care provider may recommend certain tests to check your strength and balance while standing, walking, or changing positions. Foot health exam. Foot pain and numbness, as well as not wearing proper footwear, can make you more likely to have a fall. Screenings, including: Osteoporosis screening. Osteoporosis is a condition that causes  the bones to get weaker and break more easily. Blood pressure screening. Blood pressure changes and medicines to control blood pressure can make you feel dizzy. Depression screening. You may be more likely to have a fall if you have a fear of falling, feel depressed, or feel unable to do activities that you used to do. Alcohol use screening. Using too much alcohol can affect your balance and may make you more likely to have a fall. Follow these instructions at home: Lifestyle Do not drink alcohol if: Your health care provider tells you not to drink. If you drink alcohol: Limit how much you have to: 0-1 drink a day for women. 0-2 drinks a day for men. Know how much alcohol is in your drink. In the [place] one drink equals one 12 oz bottle of beer (355 mL), one 5 oz glass of wine (148 mL), or one 1 oz glass of hard liquor (44 mL). Do not use any products that contain nicotine or tobacco. These products include cigarettes, chewing tobacco, and vaping devices, such as e-cigarettes. If you need help quitting, ask your health care provider. Activity  Follow a regular exercise program to stay fit. This will help you maintain your balance. Ask your health care provider what types of exercise are appropriate for you. If you need a cane or walker, use it as recommended by your health care provider. Wear supportive shoes that have nonskid soles. Safety  Remove any tripping hazards, such as rugs, cords, and clutter. Install safety equipment such as grab bars in bathrooms and safety rails on stairs. Keep rooms and walkways   well-lit. General instructions Talk with your health care provider about your risks for falling. Tell your health care provider if: You fall. Be sure to tell your health care provider about all falls, even ones that seem minor. You feel dizzy, tiredness (fatigue), or off-balance. Take over-the-counter and prescription medicines only as told by your health care provider. These include  supplements. Eat a healthy diet and maintain a healthy weight. A healthy diet includes low-fat dairy products, low-fat (lean) meats, and fiber from whole grains, beans, and lots of fruits and vegetables. Stay current with your vaccines. Schedule regular health, dental, and eye exams. Summary Having a healthy lifestyle and getting preventive care can help to protect your health and wellness after age 78. Screening and testing are the best way to find a health problem early and help you avoid having a fall. Early diagnosis and treatment give you the best chance for managing medical conditions that are more common for people who are older than age 78. Falls are a major cause of broken bones and head injuries in people who are older than age 78. Take precautions to prevent a fall at home. Work with your health care provider to learn what changes you can make to improve your health and wellness and to prevent falls. This information is not intended to replace advice given to you by your health care provider. Make sure you discuss any questions you have with your health care provider. Document Revised: 07/16/2020 Document Reviewed: 07/16/2020 Elsevier Patient Education  2022 [employer]  

## 2021-02-26 NOTE — Progress Notes (Signed)
Subjective:    Patient ID: Jessica Torres, female    DOB: 1940/08/10, 80 y.o.   MRN: [id number]  Chief Complaint  Patient presents with   Medical Management of Chronic Issues   Pt presents to the office today for chronic follow up. She has seen Vascular for PAD, but states she was told she no longer needed to see them.   She is followed by nephrologists for CKD.    She had a colonoscopy on 11/26/20.  Hypertension This is a chronic problem. The current episode started more than 1 year ago. The problem has been waxing and waning since onset. The problem is uncontrolled. Associated symptoms include headaches and malaise/fatigue. Pertinent negatives include no peripheral edema or shortness of breath. Risk factors for coronary artery disease include dyslipidemia and sedentary lifestyle. The current treatment provides moderate improvement. Identifiable causes of hypertension include a thyroid problem.  Gastroesophageal Reflux She complains of belching, heartburn and a hoarse voice. She reports no coughing or no sore throat. This is a chronic problem. The current episode started more than 1 year ago. The problem occurs occasionally. The problem has been waxing and waning. Associated symptoms include fatigue. She has tried a PPI for the symptoms. The treatment provided moderate relief.  Thyroid Problem Presents for follow-up visit. Symptoms include fatigue and hoarse voice. Patient reports no anxiety, constipation or depressed mood. The symptoms have been stable.  Diabetes She presents for her follow-up diabetic visit. She has type 2 diabetes mellitus. Hypoglycemia symptoms include headaches. Pertinent negatives for hypoglycemia include no confusion or nervousness/anxiousness. Associated symptoms include fatigue. Pertinent negatives for diabetes include no foot paresthesias. Symptoms are stable. Diabetic complications include nephropathy. Risk factors for coronary artery disease include dyslipidemia,  diabetes mellitus, hypertension, sedentary lifestyle and post-menopausal. She is following a generally unhealthy diet. Her overall blood glucose range is 140-180 mg/dl.  Anemia Presents for follow-up visit. Symptoms include malaise/fatigue. There has been no confusion.  Back Pain This is a chronic problem. The current episode started more than 1 year ago. The problem occurs intermittently. The problem has been waxing and waning since onset. The pain is present in the lumbar spine. The quality of the pain is described as aching. The pain is at a severity of 5/10. The pain is moderate. Associated symptoms include headaches.  Sinus Problem This is a recurrent problem. The current episode started more than 1 month ago. The problem has been waxing and waning since onset. There has been no fever. Her pain is at a severity of 8/10. The pain is moderate. Associated symptoms include congestion, headaches, a hoarse voice, sinus pressure and sneezing. Pertinent negatives include no coughing, shortness of breath or sore throat. Past treatments include oral decongestants, acetaminophen and antibiotics. The treatment provided mild relief.     Review of Systems  Constitutional:  Positive for fatigue and malaise/fatigue.  HENT:  Positive for congestion, hoarse voice, sinus pressure and sneezing. Negative for sore throat.   Respiratory:  Negative for cough and shortness of breath.   Gastrointestinal:  Positive for heartburn. Negative for constipation.  Musculoskeletal:  Positive for back pain.  Neurological:  Positive for headaches.  Psychiatric/Behavioral:  Negative for confusion. The patient is not nervous/anxious.   All other systems reviewed and are negative.     Objective:   Physical Exam Vitals reviewed.  Constitutional:      General: She is not in acute distress.    Appearance: She is well-developed.  HENT:     Head:  Normocephalic and atraumatic.     Right Ear: Tympanic membrane normal.     Left  Ear: Tympanic membrane normal.  Eyes:     Pupils: Pupils are equal, round, and reactive to light.  Neck:     Thyroid: No thyromegaly.  Cardiovascular:     Rate and Rhythm: Normal rate and regular rhythm.     Heart sounds: Normal heart sounds. No murmur heard. Pulmonary:     Effort: Pulmonary effort is normal. No respiratory distress.     Breath sounds: Normal breath sounds. No wheezing.  Abdominal:     General: Bowel sounds are normal. There is no distension.     Palpations: Abdomen is soft.     Tenderness: There is no abdominal tenderness.  Musculoskeletal:        General: No tenderness. Normal range of motion.     Cervical back: Normal range of motion and neck supple.  Skin:    General: Skin is warm and dry.  Neurological:     Mental Status: She is alert and oriented to person, place, and time.     Cranial Nerves: No cranial nerve deficit.     Deep Tendon Reflexes: Reflexes are normal and symmetric.  Psychiatric:        Behavior: Behavior normal.        Thought Content: Thought content normal.        Judgment: Judgment normal.   Diabetic Foot Exam - Simple   Simple Foot Form Diabetic Foot exam was performed with the following findings: Yes 02/26/2021 11:14 AM  Visual Inspection No deformities, no ulcerations, no other skin breakdown bilaterally: Yes Sensation Testing Intact to touch and monofilament testing bilaterally: Yes Pulse Check Posterior Tibialis and Dorsalis pulse intact bilaterally: Yes Comments          BP (!) 142/64    Pulse 84    Temp 98.1 F (36.7 C) (Temporal)    Ht 5' 5"  (1.651 m)    Wt 150 lb (68 kg)    BMI 24.96 kg/m   Assessment & Plan:  Jessica Torres comes in today with chief complaint of Medical Management of Chronic Issues   Diagnosis and orders addressed:  1. Hypertension associated with diabetes ([place]) - CMP14+EGFR - CBC with Differential/Platelet  2. Gastroesophageal reflux disease without esophagitis - CMP14+EGFR - CBC with  Differential/Platelet  3. Type 2 diabetes mellitus with other circulatory complication, with long-term current use of insulin ([facility]) - CMP14+EGFR - CBC with Differential/Platelet - Bayer DCA Hb A1c Waived  4. Hypothyroidism, unspecified type - CMP14+EGFR - CBC with Differential/Platelet - TSH  5. Stage 4 chronic kidney disease (HCC) - CMP14+EGFR - CBC with Differential/Platelet  6. Chronic bilateral low back pain without sciatica - CMP14+EGFR - CBC with Differential/Platelet  7. Pure hypercholesterolemia - CMP14+EGFR - CBC with Differential/Platelet  8. Vitamin D deficiency - CMP14+EGFR - CBC with Differential/Platelet  9. Iron deficiency - CMP14+EGFR - CBC with Differential/Platelet  10. Other fatigue - CMP14+EGFR - CBC with Differential/Platelet  11. Claudication in peripheral vascular disease ([place]) - CMP14+EGFR - CBC with Differential/Platelet  12. Recurrent sinusitis Start doxycycline - Take meds as prescribed - Use a cool mist humidifier  -Use saline nose sprays frequently -Force fluids -For any cough or congestion  Use plain Mucinex- regular strength or max strength is fine -For fever or aces or pains- take tylenol or ibuprofen. -Throat lozenges if help - doxycycline (VIBRA-TABS) 100 MG tablet; Take 1 tablet (100 mg total) by mouth 2 (  two) times daily.  Dispense: 20 tablet; Refill: 0 - CMP14+EGFR - CBC with Differential/Platelet   Labs pending Health Maintenance reviewed Diet and exercise encouraged  Follow up plan: 6 months    [name], FNP

## 2021-02-27 LAB — CMP14+EGFR
ALT: 17 IU/L (ref 0–32)
AST: 40 IU/L (ref 0–40)
Albumin/Globulin Ratio: 1.6 (ref 1.2–2.2)
Albumin: 4.1 g/dL (ref 3.7–4.7)
Alkaline Phosphatase: 71 IU/L (ref 44–121)
BUN/Creatinine Ratio: 13 (ref 12–28)
BUN: 18 mg/dL (ref 8–27)
Bilirubin Total: 0.3 mg/dL (ref 0.0–1.2)
CO2: 22 mmol/L (ref 20–29)
Calcium: 9.8 mg/dL (ref 8.7–10.3)
Chloride: 100 mmol/L (ref 96–106)
Creatinine, Ser: 1.35 mg/dL — ABNORMAL HIGH (ref 0.57–1.00)
Globulin, Total: 2.6 g/dL (ref 1.5–4.5)
Glucose: 200 mg/dL — ABNORMAL HIGH (ref 70–99)
Potassium: 4.6 mmol/L (ref 3.5–5.2)
Sodium: 136 mmol/L (ref 134–144)
Total Protein: 6.7 g/dL (ref 6.0–8.5)
eGFR: 40 mL/min/{1.73_m2} — ABNORMAL LOW (ref >59)

## 2021-02-27 LAB — CBC WITH DIFFERENTIAL/PLATELET
Basophils Absolute: 0.1 10*3/uL (ref 0.0–0.2)
Basos: 1 %
EOS (ABSOLUTE): 0.2 10*3/uL (ref 0.0–0.4)
Eos: 3 %
Hematocrit: 39.4 % (ref 34.0–46.6)
Hemoglobin: 12.6 g/dL (ref 11.1–15.9)
Immature Grans (Abs): 0 10*3/uL (ref 0.0–0.1)
Immature Granulocytes: 0 %
Lymphocytes Absolute: 1.6 10*3/uL (ref 0.7–3.1)
Lymphs: 23 %
MCH: 29.4 pg (ref 26.6–33.0)
MCHC: 32 g/dL (ref 31.5–35.7)
MCV: 92 fL (ref 79–97)
Monocytes Absolute: 1.1 10*3/uL — ABNORMAL HIGH (ref 0.1–0.9)
Monocytes: 15 %
Neutrophils Absolute: 4 10*3/uL (ref 1.4–7.0)
Neutrophils: 58 %
Platelets: 286 10*3/uL (ref 150–450)
RBC: 4.28 x10E6/uL (ref 3.77–5.28)
RDW: 12.6 % (ref 11.7–15.4)
WBC: 6.9 10*3/uL (ref 3.4–10.8)

## 2021-02-27 LAB — TSH: TSH: 1.39 u[IU]/mL (ref 0.450–4.500)

## 2021-03-05 DIAGNOSIS — E611 Iron deficiency: Secondary | ICD-10-CM | POA: Diagnosis not present

## 2021-03-05 DIAGNOSIS — N189 Chronic kidney disease, unspecified: Secondary | ICD-10-CM | POA: Diagnosis not present

## 2021-03-05 DIAGNOSIS — R809 Proteinuria, unspecified: Secondary | ICD-10-CM | POA: Diagnosis not present

## 2021-03-05 DIAGNOSIS — E1122 Type 2 diabetes mellitus with diabetic chronic kidney disease: Secondary | ICD-10-CM | POA: Diagnosis not present

## 2021-03-05 DIAGNOSIS — I129 Hypertensive chronic kidney disease with stage 1 through stage 4 chronic kidney disease, or unspecified chronic kidney disease: Secondary | ICD-10-CM | POA: Diagnosis not present

## 2021-03-05 DIAGNOSIS — E1129 Type 2 diabetes mellitus with other diabetic kidney complication: Secondary | ICD-10-CM | POA: Diagnosis not present

## 2021-03-07 DIAGNOSIS — E1129 Type 2 diabetes mellitus with other diabetic kidney complication: Secondary | ICD-10-CM | POA: Diagnosis not present

## 2021-03-07 DIAGNOSIS — E611 Iron deficiency: Secondary | ICD-10-CM | POA: Diagnosis not present

## 2021-03-07 DIAGNOSIS — E1122 Type 2 diabetes mellitus with diabetic chronic kidney disease: Secondary | ICD-10-CM | POA: Diagnosis not present

## 2021-03-07 DIAGNOSIS — R809 Proteinuria, unspecified: Secondary | ICD-10-CM | POA: Diagnosis not present

## 2021-03-07 DIAGNOSIS — I129 Hypertensive chronic kidney disease with stage 1 through stage 4 chronic kidney disease, or unspecified chronic kidney disease: Secondary | ICD-10-CM | POA: Diagnosis not present

## 2021-03-07 DIAGNOSIS — N189 Chronic kidney disease, unspecified: Secondary | ICD-10-CM | POA: Diagnosis not present

## 2021-03-07 DIAGNOSIS — R6889 Other general symptoms and signs: Secondary | ICD-10-CM | POA: Diagnosis not present

## 2021-03-14 DIAGNOSIS — J209 Acute bronchitis, unspecified: Secondary | ICD-10-CM | POA: Diagnosis not present

## 2021-03-14 DIAGNOSIS — R0602 Shortness of breath: Secondary | ICD-10-CM | POA: Diagnosis not present

## 2021-03-14 DIAGNOSIS — R059 Cough, unspecified: Secondary | ICD-10-CM | POA: Diagnosis not present

## 2021-03-14 DIAGNOSIS — J029 Acute pharyngitis, unspecified: Secondary | ICD-10-CM | POA: Diagnosis not present

## 2021-03-14 DIAGNOSIS — R0981 Nasal congestion: Secondary | ICD-10-CM | POA: Diagnosis not present

## 2021-03-14 DIAGNOSIS — Z28310 Unvaccinated for covid-19: Secondary | ICD-10-CM | POA: Diagnosis not present

## 2021-03-14 DIAGNOSIS — I10 Essential (primary) hypertension: Secondary | ICD-10-CM | POA: Diagnosis not present

## 2021-03-14 DIAGNOSIS — E119 Type 2 diabetes mellitus without complications: Secondary | ICD-10-CM | POA: Diagnosis not present

## 2021-03-15 ENCOUNTER — Encounter: Payer: Self-pay | Admitting: Nurse Practitioner

## 2021-03-15 ENCOUNTER — Ambulatory Visit (INDEPENDENT_AMBULATORY_CARE_PROVIDER_SITE_OTHER): Payer: Medicare (Managed Care) | Admitting: Nurse Practitioner

## 2021-03-15 DIAGNOSIS — Z91199 Patient's noncompliance with other medical treatment and regimen due to unspecified reason: Secondary | ICD-10-CM | POA: Insufficient documentation

## 2021-03-15 NOTE — Progress Notes (Signed)
Patient did not answer call at time of visit, and later called to say she had already visited  gone to [facility] for care

## 2021-04-01 ENCOUNTER — Telehealth: Payer: Self-pay | Admitting: Family

## 2021-04-01 DIAGNOSIS — E1059 Type 1 diabetes mellitus with other circulatory complications: Secondary | ICD-10-CM

## 2021-04-01 DIAGNOSIS — Z794 Long term (current) use of insulin: Secondary | ICD-10-CM

## 2021-04-01 NOTE — Telephone Encounter (Signed)
°  Prescription Request  04/01/2021  What is the name of the medication or equipment? ACCU-CHEK AVIVA PLUS Test Strips, ALCOHOL SWABS, and BD PEN NEEDLE NANO 2ND GEN   Have you contacted your pharmacy to request a refill? YES  Which pharmacy would you [facility] sent to? [facility] [place]

## 2021-04-02 ENCOUNTER — Other Ambulatory Visit: Payer: Self-pay | Admitting: Family

## 2021-04-02 DIAGNOSIS — E1059 Type 1 diabetes mellitus with other circulatory complications: Secondary | ICD-10-CM

## 2021-04-02 MED ORDER — ACCU-CHEK AVIVA PLUS VI STRP: ORAL_STRIP | 11 refills | Status: DC

## 2021-04-02 MED ORDER — BD PEN NEEDLE NANO 2ND GEN 32G X 4 MM MISC: 1.0000 "application " | Freq: Two times a day (BID) | 6 refills | Status: DC

## 2021-04-02 MED ORDER — BD SWAB SINGLE USE REGULAR PADS: MEDICATED_PAD | 3 refills | Status: DC

## 2021-04-02 NOTE — Telephone Encounter (Signed)
done

## 2021-04-22 ENCOUNTER — Telehealth: Payer: Self-pay | Admitting: Family

## 2021-04-22 MED ORDER — PANTOPRAZOLE SODIUM 40 MG PO TBEC: 40.0000 mg | DELAYED_RELEASE_TABLET | Freq: Every day | ORAL | 1 refills | Status: DC

## 2021-04-22 NOTE — Telephone Encounter (Signed)
°  Prescription Request  04/22/2021  Is this a "Controlled Substance" medicine? no  Have you seen your PCP in the last 2 weeks? no  If YES, route message to pool  -  If NO, patient needs to be scheduled for appointment.  What is the name of the medication or equipment? pantoprazole (PROTONIX) 40 MG tablet  Have you contacted your pharmacy to request a refill? Switching pharmacy     Which pharmacy would you like this sent to? [facility] in [place]   Patient notified that their request is being sent to the clinical staff for review and that they should receive a response within 2 business days.

## 2021-04-26 ENCOUNTER — Other Ambulatory Visit: Payer: Self-pay | Admitting: Family

## 2021-04-29 ENCOUNTER — Other Ambulatory Visit: Payer: Self-pay | Admitting: Family

## 2021-04-29 DIAGNOSIS — E7800 Pure hypercholesterolemia, unspecified: Secondary | ICD-10-CM

## 2021-04-30 ENCOUNTER — Telehealth: Payer: Self-pay | Admitting: Family

## 2021-04-30 MED ORDER — ACCU-CHEK AVIVA PLUS VI STRP: ORAL_STRIP | 11 refills | Status: DC

## 2021-04-30 NOTE — Telephone Encounter (Signed)
°  Prescription Request  04/30/2021  Is this a "Controlled Substance" medicine? no  Have you seen your PCP in the last 2 weeks? no  If YES, route message to pool  -  If NO, patient needs to be scheduled for appointment.  What is the name of the medication or equipment? Test Strips for Accu Check Avisa Plus  Have you contacted your pharmacy to request a refill? no  Which pharmacy would you like this sent to? [facility]   Patient notified that their request is being sent to the clinical staff for review and that they should receive a response within 2 business days.   Lenna Gilford' pt.  [facility].  Please call pt.

## 2021-04-30 NOTE — Telephone Encounter (Signed)
Prescription sent to pharmacy.  Tried to call patient, no answer, no voicemail

## 2021-05-02 MED ORDER — ACCU-CHEK AVIVA PLUS VI STRP: ORAL_STRIP | 3 refills | Status: DC

## 2021-05-02 NOTE — Telephone Encounter (Signed)
Script for test strips needing specific directions & Dx. Updated & resent

## 2021-05-13 ENCOUNTER — Other Ambulatory Visit: Payer: Self-pay | Admitting: Family

## 2021-05-13 DIAGNOSIS — E1159 Type 2 diabetes mellitus with other circulatory complications: Secondary | ICD-10-CM

## 2021-05-17 DIAGNOSIS — E611 Iron deficiency: Secondary | ICD-10-CM | POA: Diagnosis not present

## 2021-05-17 DIAGNOSIS — R809 Proteinuria, unspecified: Secondary | ICD-10-CM | POA: Diagnosis not present

## 2021-05-17 DIAGNOSIS — E1122 Type 2 diabetes mellitus with diabetic chronic kidney disease: Secondary | ICD-10-CM | POA: Diagnosis not present

## 2021-05-17 DIAGNOSIS — N189 Chronic kidney disease, unspecified: Secondary | ICD-10-CM | POA: Diagnosis not present

## 2021-05-17 DIAGNOSIS — R6889 Other general symptoms and signs: Secondary | ICD-10-CM | POA: Diagnosis not present

## 2021-05-17 DIAGNOSIS — E1129 Type 2 diabetes mellitus with other diabetic kidney complication: Secondary | ICD-10-CM | POA: Diagnosis not present

## 2021-05-17 DIAGNOSIS — I129 Hypertensive chronic kidney disease with stage 1 through stage 4 chronic kidney disease, or unspecified chronic kidney disease: Secondary | ICD-10-CM | POA: Diagnosis not present

## 2021-05-22 ENCOUNTER — Encounter: Payer: Self-pay | Admitting: Family

## 2021-06-03 ENCOUNTER — Telehealth: Payer: Self-pay | Admitting: Family

## 2021-06-03 NOTE — Telephone Encounter (Signed)
Pt called stating that she uses [facility] mail order and needs Alyse Low to change her Humalog to Novalog because the pharmacy does not carry Humalog. Needs Novalog Rx sent to [facility]. Pt says she also needs needles for the novalog sent to the pharmacy.  ? ?Says [facility] does not carry the accucheck aviva so pt had to change to Freestyle Lite and needs PCP to send test strips for the freestyle lite to [facility] in [place].  ?

## 2021-06-04 MED ORDER — GLUCOSE BLOOD VI STRP: ORAL_STRIP | 0 refills | Status: DC

## 2021-06-04 NOTE — Telephone Encounter (Signed)
Patient called to say that she got the sehe left on 3-27 straight with [employer] and don't need but she will the Test strips for Free Style Lite today called into [place] [facility]. She will be out today. ?

## 2021-06-04 NOTE — Telephone Encounter (Signed)
NA

## 2021-06-04 NOTE — Telephone Encounter (Signed)
Strips sent over also spoke with patient about pharmacy change and medication she states the pharmacy will call us and tell us what they need. ?

## 2021-06-04 NOTE — Addendum Note (Signed)
Addended by: Brynda Peon F on: 06/04/2021 11:36 AM ? ? Modules accepted: Orders ? ?

## 2021-06-07 ENCOUNTER — Other Ambulatory Visit: Payer: Self-pay | Admitting: *Deleted

## 2021-06-07 MED ORDER — FREESTYLE LITE TEST VI STRP: ORAL_STRIP | 3 refills | Status: DC

## 2021-06-07 MED ORDER — FREESTYLE LANCETS MISC: 3 refills | Status: DC

## 2021-06-07 MED ORDER — INSULIN LISPRO (1 UNIT DIAL) 100 UNIT/ML (KWIKPEN): 10.0000 [IU] | PEN_INJECTOR | Freq: Three times a day (TID) | SUBCUTANEOUS | 0 refills | Status: DC

## 2021-06-08 DIAGNOSIS — N189 Chronic kidney disease, unspecified: Secondary | ICD-10-CM | POA: Diagnosis not present

## 2021-06-08 DIAGNOSIS — I129 Hypertensive chronic kidney disease with stage 1 through stage 4 chronic kidney disease, or unspecified chronic kidney disease: Secondary | ICD-10-CM | POA: Diagnosis not present

## 2021-06-08 DIAGNOSIS — E1129 Type 2 diabetes mellitus with other diabetic kidney complication: Secondary | ICD-10-CM | POA: Diagnosis not present

## 2021-06-08 DIAGNOSIS — E559 Vitamin D deficiency, unspecified: Secondary | ICD-10-CM | POA: Diagnosis not present

## 2021-06-08 DIAGNOSIS — R809 Proteinuria, unspecified: Secondary | ICD-10-CM | POA: Diagnosis not present

## 2021-06-08 DIAGNOSIS — E1122 Type 2 diabetes mellitus with diabetic chronic kidney disease: Secondary | ICD-10-CM | POA: Diagnosis not present

## 2021-06-18 NOTE — Progress Notes (Deleted)
? ? ?Referring Provider: Sharion Balloon, FNP ?Primary Care Physician:  Sharion Balloon, FNP ?Primary GI Physician: Dr. Abbey Chatters ? ?No chief complaint on file. ? ? ?HPI:   ?Jessica Torres is a 81 y.o. female presenting today for follow-up of GERD and iron deficiency s/p colonoscopy 11/26/2020 with nonbleeding internal hemorrhoids, diverticulosis in the sigmoid and descending colon, four 3-4 mm tubular adenomas resected and retrieved.  Recommended repeat colonoscopy in 5 years if benefits outweigh risk. ? ?She was last seen in our office at the time of initial consult on 10/11/2020.  She was referred by her kidney doctor as she was recently been found to have iron deficiency (saturation 7%, iron 35, ferritin 19), but hemoglobin stable at 12.4.  She denied overt GI bleeding.  Chronic reflux is well controlled on Protonix 40 mg daily.  No other significant GI symptoms.  She was on Plavix and aspirin with history of PVD and stroke.  Recommended continuing Protonix and scheduling colonoscopy for evaluation of iron deficiency. ? ?Most recent labs 05/17/2021 with hemoglobin stable at 12.7 with normocytic indices.  Iron panel within normal limits though low normal with ferritin 35, iron 76, saturation 18% .  Creatinine 1.61. ? ?Today:  ? ? ?Past Medical History:  ?Diagnosis Date  ? Arthritis   ? "fingers probably" (10/30/2016)  ? Bulging lumbar disc   ? Chronic lower back pain   ? Difficulty urinating   ? GERD (gastroesophageal reflux disease)   ? Heart murmur   ? hx  ? Hyperlipidemia   ? statin intolerant  ? Hypertension   ? Hypothyroidism   ? PAD (peripheral artery disease) ([place])   ? Peripheral arterial disease ([place])   ? Shortness of breath dyspnea   ? Sleep apnea   ? "dx'd years ago; had severe problems; all of the sudden it just went away" (07/06/2014) *still gone" (10/30/2016)  ? Stroke [facility]) ~ 2010  ? denies residual on 10/30/2016  ? Type 2 diabetes mellitus ([place])   ? Urgency of urination   ? Uterine cancer ([place])   ? ? ?Past  Surgical History:  ?Procedure Laterality Date  ? ANGIOPLASTY Right 01/22/2015  ? w/ atherectomy R sfa  ? BALLOON ANGIOPLASTY, ARTERY Right   ? SFA/notes 07/06/2014  ? BREAST EXCISIONAL BIOPSY Left   ? BREAST SURGERY    ? CARPAL TUNNEL RELEASE Right 1980's  ? COLONOSCOPY WITH PROPOFOL N/A 11/26/2020  ? Procedure: COLONOSCOPY WITH PROPOFOL;  Surgeon: Eloise Harman, DO;  [facility];  Service: Endoscopy;  Laterality: N/A;  9:00am, pt cannot come in earlier  ? DILATION AND CURETTAGE OF UTERUS    ? HEMORRHOID SURGERY    ? "dr lanced it; in dr's office"  ? INCISION AND DRAINAGE BREAST ABSCESS Left   ? LOWER EXTREMITY ANGIOGRAM N/A 07/06/2014  ? Procedure: LOWER EXTREMITY ANGIOGRAM & R-SFA atherectomy and balloon angioplasty;  Surgeon: Lorretta Harp, MD;  Location: [facility] CATH LAB;  Service: Cardiovascular;  Laterality: R  ? LOWER EXTREMITY ANGIOGRAM  10/30/2016  ? LOWER EXTREMITY ANGIOGRAPHY N/A 10/30/2016  ? Procedure: Lower Extremity Angiography;  Surgeon: Lorretta Harp, MD;  Location: [facility] CV LAB;  Service: Cardiovascular;  Laterality: N/A;  ? PERIPHERAL VASCULAR CATHETERIZATION N/A 08/17/2014  ? Procedure: Lower Extremity Angiography;  Surgeon: Lorretta Harp, MD;  Location: [facility] CV LAB;  Service: Cardiovascular;  Laterality: N/A;  ? PERIPHERAL VASCULAR CATHETERIZATION Left 08/17/2014  ? Procedure: Peripheral Vascular Atherectomy and Viabahn stent L-SFA;  Surgeon: Lorretta Harp, MD; Laterality: Left; L-SFA  ? PERIPHERAL VASCULAR CATHETERIZATION N/A 01/11/2015  ? Procedure: Lower Extremity Angiography;  Surgeon: Lorretta Harp, MD;  Location: [facility] CV LAB;  Service: Cardiovascular;  Laterality: N/A;  ? PERIPHERAL VASCULAR CATHETERIZATION Right 01/22/2015  ? Procedure: Peripheral Vascular Atherectomy;  Surgeon: Lorretta Harp, MD;  Location: [facility] CV LAB;  Service: Cardiovascular;  Laterality: Right;  ? POLYPECTOMY  11/26/2020  ? Procedure: POLYPECTOMY;  Surgeon: Eloise Harman, DO;  [facility];  Service: Endoscopy;;  ? TUBAL LIGATION    ? VAGINAL HYSTERECTOMY    ? ? ?Current Outpatient Medications  ?Medication Sig Dispense Refill  ? Alcohol Swabs (B-D SINGLE USE SWABS REGULAR) PADS Test BS 4 times daily Dx E11.9 400 each 3  ? BD PEN NEEDLE NANO 2ND GEN 32G X 4 MM MISC Inject 1 application into the skin in the morning and at bedtime. 100 each 6  ? cilostazol (PLETAL) 50 MG tablet Take 1 tablet (50 mg total) by mouth 2 (two) times daily. 180 tablet 3  ? clopidogrel (PLAVIX) 75 MG tablet TAKE 1 TABLET DAILY 90 tablet 0  ? doxycycline (VIBRA-TABS) 100 MG tablet Take 1 tablet (100 mg total) by mouth 2 (two) times daily. 20 tablet 0  ? empagliflozin (JARDIANCE) 25 MG TABS tablet Take 25 mg by mouth daily.    ? ergocalciferol (VITAMIN D2) 1.25 MG (50000 UT) capsule Take 1 capsule (50,000 Units total) by mouth once a week. No specific day 12 capsule 3  ? ezetimibe (ZETIA) 10 MG tablet TAKE 1 TABLET DAILY 90 tablet 0  ? fenofibrate 160 MG tablet TAKE 1 TABLET DAILY 90 tablet 0  ? ferrous sulfate 325 (65 FE) MG EC tablet Take 325 mg by mouth once a week.    ? glucose blood (FREESTYLE LITE) test strip Test BS TID Dx E11.9 300 each 3  ? insulin lispro (HUMALOG) 100 UNIT/ML KwikPen Inject 10-20 Units into the skin 3 (three) times daily. 45 mL 0  ? Lancets (FREESTYLE) lancets Test BS TID Dx E11.9 300 each 3  ? levothyroxine (SYNTHROID) 88 MCG tablet TAKE 1 TABLET DAILY BEFORE BREAKFAST 90 tablet 2  ? losartan-hydrochlorothiazide (HYZAAR) 50-12.5 MG tablet TAKE 1 TABLET DAILY 90 tablet 3  ? pantoprazole (PROTONIX) 40 MG tablet Take 1 tablet (40 mg total) by mouth daily. 90 tablet 1  ? TOUJEO MAX SOLOSTAR 300 UNIT/ML Solostar Pen INJECT 52 UNITS UNDER THE SKIN DAILY 18 mL 0  ? ?No current facility-administered medications for this visit.  ? ? ?Allergies as of 06/20/2021 - Review Complete 02/26/2021  ?Allergen Reaction Noted  ? Morphine and related Swelling and Other (See Comments)  06/14/2014  ? Latex Rash 07/06/2014  ? Statins Other (See Comments) 08/02/2014  ? ? ?Family History  ?Problem Relation Age of Onset  ? Heart attack Mother   ? Heart disease Mother   ? Arrhythmia Mother   ?     has PPM  ? Heart disease Father   ? Diabetes Sister   ? ? ?Social History  ? ?Socioeconomic History  ? Marital status: Divorced  ?  Spouse name: Not on file  ? Number of children: 4  ? Years of education: Not on file  ? Highest education level: Not on file  ?Occupational History  ? Occupation: retired  ?Tobacco Use  ? Smoking status: Never  ? Smokeless tobacco: Never  ?Vaping Use  ? Vaping Use: Never used  ?  Substance and Sexual Activity  ? Alcohol use: Yes  ?  Alcohol/week: 0.0 standard drinks  ?  Comment: 10/30/2016  "margarita q  2 wks or so"  ? Drug use: No  ? Sexual activity: Never  ?Other Topics Concern  ? Not on file  ?Social History Narrative  ? 2 sons and their sons are living with her right now  ? Another son lives next door and daughter lives in [place]  ? ?Social Determinants of Health  ? ?Financial Resource Strain: Low Risk   ? Difficulty of Paying Living Expenses: Not hard at all  ?Food Insecurity: No Food Insecurity  ? Worried About Charity fundraiser in the Last Year: Never true  ? Ran Out of Food in the Last Year: Never true  ?Transportation Needs: No Transportation Needs  ? Lack of Transportation (Medical): No  ? Lack of Transportation (Non-Medical): No  ?Physical Activity: Inactive  ? Days of Exercise per Week: 0 days  ? Minutes of Exercise per Session: 0 min  ?Stress: Stress Concern Present  ? Feeling of Stress : To some extent  ?Social Connections: Moderately Integrated  ? Frequency of Communication with Friends and Family: More than three times a week  ? Frequency of Social Gatherings with Friends and Family: More than three times a week  ? Attends Religious Services: More than 4 times per year  ? Active Member of Clubs or Organizations: Yes  ? Attends Archivist Meetings:  More than 4 times per year  ? Marital Status: Divorced  ? ? ?Review of Systems: ?Gen: Denies fever, chills, cold or flulike symptoms, presyncope, syncope. ?CV: Denies chest pain, palpitations.Marland Kitchen ?Resp: Arminda Resides

## 2021-06-20 ENCOUNTER — Ambulatory Visit: Payer: Medicare (Managed Care) | Admitting: Gastroenterology

## 2021-06-20 ENCOUNTER — Encounter: Payer: Self-pay | Admitting: Internal Medicine

## 2021-06-27 ENCOUNTER — Telehealth: Payer: Self-pay | Admitting: Family

## 2021-06-27 DIAGNOSIS — Z794 Long term (current) use of insulin: Secondary | ICD-10-CM

## 2021-06-27 MED ORDER — BD PEN NEEDLE NANO 2ND GEN 32G X 4 MM MISC
0 refills | Status: DC
Start: 2021-06-27 — End: 2021-06-27

## 2021-06-27 MED ORDER — BD PEN NEEDLE NANO 2ND GEN 32G X 4 MM MISC
3 refills | Status: DC
Start: 2021-06-27 — End: 2021-11-25

## 2021-06-27 NOTE — Telephone Encounter (Signed)
Patient calls back and states she also needs Christy to send RX for the BD Needle NANO 2nd Gen 32 G X 4 MM MISC to [facility] for #90 and also needs the one sent to [facility]. ?

## 2021-06-27 NOTE — Telephone Encounter (Signed)
Please send BD PEN NEEDLE NANO 2ND GEN 32G X 4 MM MISC to [facility]. Pt is completely out. Pt wants to use local pharmacy till she gets her supply from mail order. Every time she calls to order refills she gets discounted. Please call back when done. ?

## 2021-06-27 NOTE — Telephone Encounter (Signed)
NA/NVM wanting to clarify how many times a day she is using the pen needles ? ?Pt is using twice daily, updated directions with Dx, sent #100 to [facility] & #200 to [facility] ?

## 2021-07-29 ENCOUNTER — Other Ambulatory Visit: Payer: Self-pay | Admitting: Family

## 2021-07-29 DIAGNOSIS — E7800 Pure hypercholesterolemia, unspecified: Secondary | ICD-10-CM

## 2021-08-12 DIAGNOSIS — E1129 Type 2 diabetes mellitus with other diabetic kidney complication: Secondary | ICD-10-CM | POA: Diagnosis not present

## 2021-08-12 DIAGNOSIS — R809 Proteinuria, unspecified: Secondary | ICD-10-CM | POA: Diagnosis not present

## 2021-08-12 DIAGNOSIS — E1122 Type 2 diabetes mellitus with diabetic chronic kidney disease: Secondary | ICD-10-CM | POA: Diagnosis not present

## 2021-08-12 DIAGNOSIS — E559 Vitamin D deficiency, unspecified: Secondary | ICD-10-CM | POA: Diagnosis not present

## 2021-08-12 DIAGNOSIS — N189 Chronic kidney disease, unspecified: Secondary | ICD-10-CM | POA: Diagnosis not present

## 2021-08-12 DIAGNOSIS — I129 Hypertensive chronic kidney disease with stage 1 through stage 4 chronic kidney disease, or unspecified chronic kidney disease: Secondary | ICD-10-CM | POA: Diagnosis not present

## 2021-08-22 ENCOUNTER — Encounter: Payer: Self-pay | Admitting: Family

## 2021-08-22 ENCOUNTER — Ambulatory Visit (INDEPENDENT_AMBULATORY_CARE_PROVIDER_SITE_OTHER): Payer: Medicare (Managed Care) | Admitting: Family

## 2021-08-22 ENCOUNTER — Ambulatory Visit: Payer: Medicare (Managed Care) | Admitting: Family

## 2021-08-22 VITALS — BP 118/68 | HR 71 | Temp 98.2°F | Ht 65.0 in | Wt 153.0 lb

## 2021-08-22 DIAGNOSIS — N184 Chronic kidney disease, stage 4 (severe): Secondary | ICD-10-CM

## 2021-08-22 DIAGNOSIS — E1159 Type 2 diabetes mellitus with other circulatory complications: Secondary | ICD-10-CM

## 2021-08-22 DIAGNOSIS — I2510 Atherosclerotic heart disease of native coronary artery without angina pectoris: Secondary | ICD-10-CM | POA: Diagnosis not present

## 2021-08-22 DIAGNOSIS — Z0000 Encounter for general adult medical examination without abnormal findings: Secondary | ICD-10-CM | POA: Diagnosis not present

## 2021-08-22 DIAGNOSIS — Z0001 Encounter for general adult medical examination with abnormal findings: Secondary | ICD-10-CM | POA: Diagnosis not present

## 2021-08-22 DIAGNOSIS — E039 Hypothyroidism, unspecified: Secondary | ICD-10-CM | POA: Diagnosis not present

## 2021-08-22 DIAGNOSIS — K219 Gastro-esophageal reflux disease without esophagitis: Secondary | ICD-10-CM

## 2021-08-22 DIAGNOSIS — I2583 Coronary atherosclerosis due to lipid rich plaque: Secondary | ICD-10-CM

## 2021-08-22 DIAGNOSIS — Z794 Long term (current) use of insulin: Secondary | ICD-10-CM | POA: Diagnosis not present

## 2021-08-22 DIAGNOSIS — E559 Vitamin D deficiency, unspecified: Secondary | ICD-10-CM

## 2021-08-22 DIAGNOSIS — E7800 Pure hypercholesterolemia, unspecified: Secondary | ICD-10-CM | POA: Diagnosis not present

## 2021-08-22 DIAGNOSIS — E611 Iron deficiency: Secondary | ICD-10-CM | POA: Diagnosis not present

## 2021-08-22 DIAGNOSIS — M545 Low back pain, unspecified: Secondary | ICD-10-CM

## 2021-08-22 DIAGNOSIS — I739 Peripheral vascular disease, unspecified: Secondary | ICD-10-CM

## 2021-08-22 DIAGNOSIS — G8929 Other chronic pain: Secondary | ICD-10-CM

## 2021-08-22 DIAGNOSIS — R739 Hyperglycemia, unspecified: Secondary | ICD-10-CM | POA: Diagnosis not present

## 2021-08-22 DIAGNOSIS — Z23 Encounter for immunization: Secondary | ICD-10-CM

## 2021-08-22 DIAGNOSIS — I152 Hypertension secondary to endocrine disorders: Secondary | ICD-10-CM

## 2021-08-22 LAB — BAYER DCA HB A1C WAIVED: HB A1C (BAYER DCA - WAIVED): 8 % — ABNORMAL HIGH (ref 4.8–5.6)

## 2021-08-22 MED ORDER — FENOFIBRATE 160 MG PO TABS: 160.0000 mg | ORAL_TABLET | Freq: Every day | ORAL | 1 refills | Status: DC

## 2021-08-22 MED ORDER — INSULIN LISPRO (1 UNIT DIAL) 100 UNIT/ML (KWIKPEN): 10.0000 [IU] | PEN_INJECTOR | Freq: Three times a day (TID) | SUBCUTANEOUS | 0 refills | Status: DC

## 2021-08-22 MED ORDER — TOUJEO MAX SOLOSTAR 300 UNIT/ML ~~LOC~~ SOPN: PEN_INJECTOR | SUBCUTANEOUS | 1 refills | Status: DC

## 2021-08-22 MED ORDER — EZETIMIBE 10 MG PO TABS: 10.0000 mg | ORAL_TABLET | Freq: Every day | ORAL | 1 refills | Status: DC

## 2021-08-22 MED ORDER — PANTOPRAZOLE SODIUM 40 MG PO TBEC: 40.0000 mg | DELAYED_RELEASE_TABLET | Freq: Every day | ORAL | 1 refills | Status: DC

## 2021-08-22 MED ORDER — CLOPIDOGREL BISULFATE 75 MG PO TABS: 75.0000 mg | ORAL_TABLET | Freq: Every day | ORAL | 1 refills | Status: DC

## 2021-08-22 MED ORDER — ERGOCALCIFEROL 1.25 MG (50000 UT) PO CAPS: 50000.0000 [IU] | ORAL_CAPSULE | ORAL | 3 refills | Status: DC

## 2021-08-22 NOTE — Patient Instructions (Signed)
Health Maintenance After Age 79 After age 79, you are at a higher risk for certain long-term diseases and infections as well as injuries from falls. Falls are a major cause of broken bones and head injuries in people who are older than age 79. Getting regular preventive care can help to keep you healthy and well. Preventive care includes getting regular testing and making lifestyle changes as recommended by your health care provider. Talk with your health care provider about: Which screenings and tests you should have. A screening is a test that checks for a disease when you have no symptoms. A diet and exercise plan that is right for you. What should I know about screenings and tests to prevent falls? Screening and testing are the best ways to find a health problem early. Early diagnosis and treatment give you the best chance of managing medical conditions that are common after age 79. Certain conditions and lifestyle choices may make you more likely to have a fall. Your health care provider may recommend: Regular vision checks. Poor vision and conditions such as cataracts can make you more likely to have a fall. If you wear glasses, make sure to get your prescription updated if your vision changes. Medicine review. Work with your health care provider to regularly review all of the medicines you are taking, including over-the-counter medicines. Ask your health care provider about any side effects that may make you more likely to have a fall. Tell your health care provider if any medicines that you take make you feel dizzy or sleepy. Strength and balance checks. Your health care provider may recommend certain tests to check your strength and balance while standing, walking, or changing positions. Foot health exam. Foot pain and numbness, as well as not wearing proper footwear, can make you more likely to have a fall. Screenings, including: Osteoporosis screening. Osteoporosis is a condition that causes  the bones to get weaker and break more easily. Blood pressure screening. Blood pressure changes and medicines to control blood pressure can make you feel dizzy. Depression screening. You may be more likely to have a fall if you have a fear of falling, feel depressed, or feel unable to do activities that you used to do. Alcohol use screening. Using too much alcohol can affect your balance and may make you more likely to have a fall. Follow these instructions at home: Lifestyle Do not drink alcohol if: Your health care provider tells you not to drink. If you drink alcohol: Limit how much you have to: 0-1 drink a day for women. 0-2 drinks a day for men. Know how much alcohol is in your drink. In the [place] one drink equals one 12 oz bottle of beer (355 mL), one 5 oz glass of wine (148 mL), or one 1 oz glass of hard liquor (44 mL). Do not use any products that contain nicotine or tobacco. These products include cigarettes, chewing tobacco, and vaping devices, such as e-cigarettes. If you need help quitting, ask your health care provider. Activity  Follow a regular exercise program to stay fit. This will help you maintain your balance. Ask your health care provider what types of exercise are appropriate for you. If you need a cane or walker, use it as recommended by your health care provider. Wear supportive shoes that have nonskid soles. Safety  Remove any tripping hazards, such as rugs, cords, and clutter. Install safety equipment such as grab bars in bathrooms and safety rails on stairs. Keep rooms and walkways   well-lit. General instructions Talk with your health care provider about your risks for falling. Tell your health care provider if: You fall. Be sure to tell your health care provider about all falls, even ones that seem minor. You feel dizzy, tiredness (fatigue), or off-balance. Take over-the-counter and prescription medicines only as told by your health care provider. These include  supplements. Eat a healthy diet and maintain a healthy weight. A healthy diet includes low-fat dairy products, low-fat (lean) meats, and fiber from whole grains, beans, and lots of fruits and vegetables. Stay current with your vaccines. Schedule regular health, dental, and eye exams. Summary Having a healthy lifestyle and getting preventive care can help to protect your health and wellness after age 79. Screening and testing are the best way to find a health problem early and help you avoid having a fall. Early diagnosis and treatment give you the best chance for managing medical conditions that are more common for people who are older than age 79. Falls are a major cause of broken bones and head injuries in people who are older than age 79. Take precautions to prevent a fall at home. Work with your health care provider to learn what changes you can make to improve your health and wellness and to prevent falls. This information is not intended to replace advice given to you by your health care provider. Make sure you discuss any questions you have with your health care provider. Document Revised: 07/16/2020 Document Reviewed: 07/16/2020 Elsevier Patient Education  2023 Elsevier Inc.  

## 2021-08-22 NOTE — Progress Notes (Signed)
Subjective:    Patient ID: Jessica Torres, female    DOB: 1940/12/24, 81 y.o.   MRN: [id number]  Chief Complaint  Patient presents with   Medical Management of Chronic Issues   Diabetes   Hypertension   Pt presents to the office today for CPE and chronic follow up. She has seen Vascular for PAD, but states she was told she no longer needed to see them.   She is followed by nephrologists for CKD.    She had a colonoscopy on 11/26/20.  Diabetes She presents for her follow-up diabetic visit. She has type 2 diabetes mellitus. Associated symptoms include fatigue and foot paresthesias. Pertinent negatives for diabetes include no blurred vision. Symptoms are stable. 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 Pertinent negatives for diabetic complications include no CVA. Risk factors for coronary artery disease include dyslipidemia, diabetes mellitus, hypertension, sedentary lifestyle and post-menopausal. She is following a generally unhealthy diet. Her overall blood glucose range is 180-200 mg/dl. Eye exam is current.  Hypertension This is a chronic problem. The current episode started more than 1 year ago. The problem has been resolved since onset. The problem is controlled. Associated symptoms include malaise/fatigue. Pertinent negatives include no blurred vision, peripheral edema or shortness of breath. Risk factors for coronary artery disease include dyslipidemia, diabetes mellitus, obesity and sedentary lifestyle. The current treatment provides moderate improvement. There is no history of CVA or heart failure. Identifiable causes of hypertension include a thyroid problem.  Gastroesophageal Reflux She complains of belching and heartburn. She reports no hoarse voice. This is a chronic problem. The current episode started more than 1 year ago. The problem occurs occasionally. Associated symptoms include fatigue. She has tried a PPI for the symptoms. The treatment provided  moderate relief.  Thyroid Problem Presents for follow-up visit. Symptoms include fatigue. Patient reports no depressed mood or hoarse voice. The symptoms have been stable. Her past medical history is significant for hyperlipidemia. There is no history of heart failure.  Back Pain This is a chronic problem. The current episode started more than 1 year ago. The problem occurs intermittently. The quality of the pain is described as aching. The pain is at a severity of 8/10. The pain is moderate. She has tried bed rest and NSAIDs for the symptoms. The treatment provided mild relief.  Hyperlipidemia This is a chronic problem. The current episode started more than 1 year ago. The problem is controlled. Pertinent negatives include no shortness of breath. Current antihyperlipidemic treatment includes fibric acid derivatives and ezetimibe. The current treatment provides moderate improvement of lipids. Risk factors for coronary artery disease include dyslipidemia, diabetes mellitus, hypertension, a sedentary lifestyle and post-menopausal.  Anemia Presents for follow-up visit. Symptoms include malaise/fatigue. There is no history of heart failure.      Review of Systems  Constitutional:  Positive for fatigue and malaise/fatigue.  HENT:  Negative for hoarse voice.   Eyes:  Negative for blurred vision.  Respiratory:  Negative for shortness of breath.   Gastrointestinal:  Positive for heartburn.  Musculoskeletal:  Positive for back pain.  All other systems reviewed and are negative.  Family History  Problem Relation Age of Onset   Heart attack Mother    Heart disease Mother    Arrhythmia Mother        has PPM   Heart disease Father    Diabetes Sister    Social History   Socioeconomic History   Marital status: Divorced    Spouse name: Not  on file   Number of children: 4   Years of education: Not on file   Highest education level: Not on file  Occupational History   Occupation: retired   Tobacco Use   Smoking status: Never   Smokeless tobacco: Never  Vaping Use   Vaping Use: Never used  Substance and Sexual Activity   Alcohol use: Yes    Alcohol/week: 0.0 standard drinks of alcohol    Comment: 10/30/2016  "margarita q  2 wks or so"   Drug use: No   Sexual activity: Never  Other Topics Concern   Not on file  Social History Narrative   2 sons and their sons are living with her right now   Another son lives next door and daughter lives in [place] Determinants of Health   Financial Resource Strain: [place]  (09/25/2020)   Overall Financial Resource Strain (CARDIA)    Difficulty of Paying Living Expenses: Not hard at all  Food Insecurity: No [place] (09/25/2020)   Hunger Vital Sign    Worried About Running Out of Food in the Last Year: Never true    Advance in the Last Year: Never true  Transportation Needs: No Transportation Needs (09/25/2020)   PRAPARE - Hydrologist (Medical): No    Lack of Transportation (Non-Medical): No  Physical Activity: Inactive (09/25/2020)   Exercise Vital Sign    Days of Exercise per Week: 0 days    Minutes of Exercise per Session: 0 min  Stress: Stress Concern Present (09/25/2020)   [place]    Feeling of Stress : To some extent  Social Connections: Moderately Integrated (09/25/2020)   Social Connection and Isolation Panel [NHANES]    Frequency of Communication with Friends and Family: More than three times a week    Frequency of Social Gatherings with Friends and Family: More than three times a week    Attends Religious Services: More than 4 times per year    Active Member of [employer] or Organizations: Yes    Attends Music therapist: More than 4 times per year    Marital Status: Divorced        Objective:   Physical Exam Vitals reviewed.  Constitutional:      General: She is not in acute  distress.    Appearance: She is well-developed.  HENT:     Head: Normocephalic and atraumatic.     Right Ear: Tympanic membrane normal.     Left Ear: Tympanic membrane normal.  Eyes:     Pupils: Pupils are equal, round, and reactive to light.  Neck:     Thyroid: No thyromegaly.  Cardiovascular:     Rate and Rhythm: Normal rate and regular rhythm.     Heart sounds: Normal heart sounds. No murmur heard. Pulmonary:     Effort: Pulmonary effort is normal. No respiratory distress.     Breath sounds: Normal breath sounds. No wheezing.  Abdominal:     General: Bowel sounds are normal. There is no distension.     Palpations: Abdomen is soft.     Tenderness: There is no abdominal tenderness.  Musculoskeletal:        General: No tenderness. Normal range of motion.     Cervical back: Normal range of motion and neck supple.  Skin:    General: Skin is warm and dry.  Neurological:  Mental Status: She is alert and oriented to person, place, and time.     Cranial Nerves: No cranial nerve deficit.     Deep Tendon Reflexes: Reflexes are normal and symmetric.  Psychiatric:        Behavior: Behavior normal.        Thought Content: Thought content normal.        Judgment: Judgment normal.       BP 118/68   Pulse 71   Temp 98.2 F (36.8 C)   Ht 5' 5"  (1.651 m)   Wt 153 lb (69.4 kg)   SpO2 97%   BMI 25.46 kg/m      Assessment & Plan:  Jessica Torres comes in today with chief complaint of Medical Management of Chronic Issues, Diabetes, and Hypertension   Diagnosis and orders addressed:  1. Need for shingles vaccine - Varicella-zoster vaccine IM (Shingrix) - CBC with Differential/Platelet - CMP14+EGFR  2. Pure hypercholesterolemia - CBC with Differential/Platelet - CMP14+EGFR - ezetimibe (ZETIA) 10 MG tablet; Take 1 tablet (10 mg total) by mouth daily.  Dispense: 90 tablet; Refill: 1 - fenofibrate 160 MG tablet; Take 1 tablet (160 mg total) by mouth daily.  Dispense: 90  tablet; Refill: 1  3. Type 2 diabetes mellitus with other circulatory complication, with long-term current use of insulin (HCC) - CBC with Differential/Platelet - CMP14+EGFR - insulin glargine, 2 Unit Dial, (TOUJEO MAX SOLOSTAR) 300 UNIT/ML Solostar Pen; INJECT 52 UNITS UNDER THE SKIN DAILY  Dispense: 18 mL; Refill: 1  4. Annual physical exam - Varicella-zoster vaccine IM (Shingrix) - CBC with Differential/Platelet - CMP14+EGFR - Lipid panel - TSH - Bayer DCA Hb A1c Waived - clopidogrel (PLAVIX) 75 MG tablet; Take 1 tablet (75 mg total) by mouth daily.  Dispense: 90 tablet; Refill: 1 - pantoprazole (PROTONIX) 40 MG tablet; Take 1 tablet (40 mg total) by mouth daily.  Dispense: 90 tablet; Refill: 1 - insulin glargine, 2 Unit Dial, (TOUJEO MAX SOLOSTAR) 300 UNIT/ML Solostar Pen; INJECT 52 UNITS UNDER THE SKIN DAILY  Dispense: 18 mL; Refill: 1  5. Hypertension associated with diabetes ([place])  6. Coronary artery disease due to lipid rich plaque  7. Gastroesophageal reflux disease without esophagitis  8. Hypothyroidism, unspecified type  9. Stage 4 chronic kidney disease ([place])   10. Vitamin D deficiency  11. Chronic bilateral low back pain without sciatica  12. Claudication in peripheral vascular disease ([place])  13. Iron deficiency    Labs pending Health Maintenance reviewed Diet and exercise encouraged  Follow up plan: 6 months   [name], FNP

## 2021-08-23 LAB — CBC WITH DIFFERENTIAL/PLATELET
Basophils Absolute: 0.1 10*3/uL (ref 0.0–0.2)
Basos: 1 %
EOS (ABSOLUTE): 0.3 10*3/uL (ref 0.0–0.4)
Eos: 5 %
Hematocrit: 37.5 % (ref 34.0–46.6)
Hemoglobin: 11.9 g/dL (ref 11.1–15.9)
Immature Grans (Abs): 0 10*3/uL (ref 0.0–0.1)
Immature Granulocytes: 0 %
Lymphocytes Absolute: 1.5 10*3/uL (ref 0.7–3.1)
Lymphs: 27 %
MCH: 30.4 pg (ref 26.6–33.0)
MCHC: 31.7 g/dL (ref 31.5–35.7)
MCV: 96 fL (ref 79–97)
Monocytes Absolute: 0.6 10*3/uL (ref 0.1–0.9)
Monocytes: 10 %
Neutrophils Absolute: 3.3 10*3/uL (ref 1.4–7.0)
Neutrophils: 57 %
Platelets: 255 10*3/uL (ref 150–450)
RBC: 3.91 x10E6/uL (ref 3.77–5.28)
RDW: 12.6 % (ref 11.7–15.4)
WBC: 5.8 10*3/uL (ref 3.4–10.8)

## 2021-08-23 LAB — CMP14+EGFR
ALT: 16 IU/L (ref 0–32)
AST: 40 IU/L (ref 0–40)
Albumin/Globulin Ratio: 1.5 (ref 1.2–2.2)
Albumin: 3.6 g/dL — ABNORMAL LOW (ref 3.7–4.7)
Alkaline Phosphatase: 80 IU/L (ref 44–121)
BUN/Creatinine Ratio: 15 (ref 12–28)
BUN: 21 mg/dL (ref 8–27)
Bilirubin Total: 0.4 mg/dL (ref 0.0–1.2)
CO2: 19 mmol/L — ABNORMAL LOW (ref 20–29)
Calcium: 9.3 mg/dL (ref 8.7–10.3)
Chloride: 101 mmol/L (ref 96–106)
Creatinine, Ser: 1.41 mg/dL — ABNORMAL HIGH (ref 0.57–1.00)
Globulin, Total: 2.4 g/dL (ref 1.5–4.5)
Glucose: 293 mg/dL — ABNORMAL HIGH (ref 70–99)
Potassium: 4.8 mmol/L (ref 3.5–5.2)
Sodium: 135 mmol/L (ref 134–144)
Total Protein: 6 g/dL (ref 6.0–8.5)
eGFR: 38 mL/min/{1.73_m2} — ABNORMAL LOW (ref >59)

## 2021-08-23 LAB — LIPID PANEL
Chol/HDL Ratio: 3.5 ratio (ref 0.0–4.4)
Cholesterol, Total: 126 mg/dL (ref 100–199)
HDL: 36 mg/dL — ABNORMAL LOW (ref >39)
LDL Chol Calc (NIH): 63 mg/dL (ref 0–99)
Triglycerides: 158 mg/dL — ABNORMAL HIGH (ref 0–149)
VLDL Cholesterol Cal: 27 mg/dL (ref 5–40)

## 2021-08-23 LAB — TSH: TSH: 2.3 u[IU]/mL (ref 0.450–4.500)

## 2021-09-02 ENCOUNTER — Other Ambulatory Visit: Payer: Self-pay | Admitting: Family

## 2021-09-02 DIAGNOSIS — Z794 Long term (current) use of insulin: Secondary | ICD-10-CM

## 2021-09-02 MED ORDER — SEMAGLUTIDE (1 MG/DOSE) 4 MG/3ML ~~LOC~~ SOPN: 0.2500 mg | PEN_INJECTOR | SUBCUTANEOUS | 1 refills | Status: DC

## 2021-09-04 ENCOUNTER — Telehealth: Payer: Self-pay

## 2021-09-04 ENCOUNTER — Telehealth: Payer: Self-pay | Admitting: Family

## 2021-09-04 NOTE — Telephone Encounter (Signed)
Patient calling to let us know that her insurance will not cover drisdol 1.25 mg

## 2021-09-04 NOTE — Chronic Care Management (AMB) (Signed)
  Chronic Care Management Note  09/04/2021 Name: Jessica Torres MRN: [id number] DOB: [birth year]  Jessica Torres is a 81 y.o. year old female who is a primary care patient of Sharion Balloon, FNP and is actively engaged with the care management team. I reached out to [name] by phone today to assist with scheduling a follow up visit with the Pharmacist  Follow up plan: Face to Face appointment with care management team member scheduled for: 09/25/2021  Noreene Larsson, [address], [place], [place] [address] Direct Dial: [phone] [email or web address] Website: [email or web address]

## 2021-09-05 ENCOUNTER — Encounter: Payer: Self-pay | Admitting: Emergency Medicine

## 2021-09-05 ENCOUNTER — Telehealth: Payer: Self-pay | Admitting: *Deleted

## 2021-09-05 MED ORDER — SEMAGLUTIDE(0.25 OR 0.5MG/DOS) 2 MG/3ML ~~LOC~~ SOPN: 0.2500 mg | PEN_INJECTOR | SUBCUTANEOUS | 1 refills | Status: DC

## 2021-09-05 NOTE — Addendum Note (Signed)
Addended by: [name] A on: 09/05/2021 01:32 PM   Modules accepted: Orders

## 2021-09-05 NOTE — Telephone Encounter (Signed)
PT can take OTC Vit D.

## 2021-09-05 NOTE — Telephone Encounter (Signed)
New rx sent to pharmacy.   [name], FNP

## 2021-09-05 NOTE — Telephone Encounter (Signed)
Fax from [facility]: Ozempic '1mg'$ /dose, '4mg'$ /48m Directions 0.25 mg weekly From pharmacy: Please clarify what strength the patient should be injecting

## 2021-09-05 NOTE — Telephone Encounter (Signed)
VM not set up.

## 2021-09-11 DIAGNOSIS — E1129 Type 2 diabetes mellitus with other diabetic kidney complication: Secondary | ICD-10-CM | POA: Diagnosis not present

## 2021-09-11 DIAGNOSIS — E875 Hyperkalemia: Secondary | ICD-10-CM | POA: Diagnosis not present

## 2021-09-11 DIAGNOSIS — I129 Hypertensive chronic kidney disease with stage 1 through stage 4 chronic kidney disease, or unspecified chronic kidney disease: Secondary | ICD-10-CM | POA: Diagnosis not present

## 2021-09-11 DIAGNOSIS — E871 Hypo-osmolality and hyponatremia: Secondary | ICD-10-CM | POA: Diagnosis not present

## 2021-09-11 DIAGNOSIS — R809 Proteinuria, unspecified: Secondary | ICD-10-CM | POA: Diagnosis not present

## 2021-09-11 DIAGNOSIS — E1122 Type 2 diabetes mellitus with diabetic chronic kidney disease: Secondary | ICD-10-CM | POA: Diagnosis not present

## 2021-09-11 DIAGNOSIS — E8721 Acute metabolic acidosis: Secondary | ICD-10-CM | POA: Diagnosis not present

## 2021-09-11 DIAGNOSIS — N189 Chronic kidney disease, unspecified: Secondary | ICD-10-CM | POA: Diagnosis not present

## 2021-09-11 DIAGNOSIS — E559 Vitamin D deficiency, unspecified: Secondary | ICD-10-CM | POA: Diagnosis not present

## 2021-09-11 NOTE — Telephone Encounter (Signed)
No answer, no voicemail.

## 2021-09-13 NOTE — Telephone Encounter (Signed)
Pt returned missed call. Made pt aware that PCP advised that pt can take OTC Vitamin D. Pt voiced understanding.

## 2021-09-13 NOTE — Telephone Encounter (Signed)
No answer, no voicemail.

## 2021-09-25 ENCOUNTER — Ambulatory Visit (INDEPENDENT_AMBULATORY_CARE_PROVIDER_SITE_OTHER): Payer: Medicare (Managed Care) | Admitting: Pharmacist

## 2021-09-25 DIAGNOSIS — N184 Chronic kidney disease, stage 4 (severe): Secondary | ICD-10-CM

## 2021-09-25 DIAGNOSIS — G720 Drug-induced myopathy: Secondary | ICD-10-CM

## 2021-09-25 DIAGNOSIS — Z794 Long term (current) use of insulin: Secondary | ICD-10-CM

## 2021-09-25 NOTE — Progress Notes (Signed)
Chronic Care Management Pharmacy Note  09/25/2021 Name:  Jessica Torres MRN:  [id number] DOB:  April 08, 1940  Summary:  Diabetes: New goal. Uncontrolled--A1C 8%, GFR 38 CKD 4;  Current treatment: JARDIANCE 25MG DAILY OZEMPIC 0.25MG ( FILLED OZEMPIC ON 09/05/21? IS SHE TAKING?) Denies personal and family history of Medullary thyroid cancer (MTC) TOUJEO 50-52 UNITS AT BEDTIME HUMALOG with meals--avg 10 -12 units per patient report PER MEAL Patient did not have financial forms-->we will try to get Trulicity instead  If unable, patient will have to bring in forms and we can obtain Ozempic Current glucose readings: fasting glucose: <180, post prandial glucose: n/a Libre copay >$100 via parachute Patient does not wish to use libre CGM at this time Will revisit Denies/reports hypoglycemic/hyperglycemic symptoms Current meal patterns: breakfast: CEREAL/FRUIT--DISCUSSED LOWER CARB OPTIONS Discussed meal planning options and Plate method for healthy eating Avoid sugary drinks and desserts Incorporate balanced protein, non starchy veggies, 1 serving of carbohydrate with each meal Increase water intake Increase physical activity as able Current exercise: N/A Recommended GLP1 Assessed patient finances. APPLICATION SUBMITTED FOR LILLY CARES PAP --TRULICITY   Hyperlipidemia:  New goal. Uncontrolled--LDL <70, WORKING ON TRIGLYCERIDES, DIETARY CHANGES;  current treatment: FENOFIBRATE, ZETIA GENERIC;  Medications previously tried: STATINS; MYOPATHY; ICD 10 G72  Current dietary patterns: RECOMMENDED FOLLOWING A HEART HEALTHY DIET/HEALTHY PLATE METHOD Educated on DISCUSSED FISH OIL & DIETARY CONCERNS Lipid Panel     Component Value Date/Time   CHOL 126 08/22/2021 1131   TRIG 158 (H) 08/22/2021 1131   HDL 36 (L) 08/22/2021 1131   CHOLHDL 3.5 08/22/2021 1131   LDLCALC 63 08/22/2021 1131   LABVLDL 27 08/22/2021 1131   Patient Goals/Self-Care Activities patient will:  - take medications as  prescribed as evidenced by patient report and record review check glucose 3 TIMES DAILY, DISCUSSED CGM, document, and provide at future appointments collaborate with provider on medication access solutions target a minimum of 150 minutes of moderate intensity exercise weekly engage in dietary modifications by FOLLOWING A HEART HEALTHY DIET/HEALTHY PLATE METHOD    Subjective: Jessica Torres is an 81 y.o. year old female who is a primary patient of Sharion Balloon, FNP.  The CCM team was consulted for assistance with disease management and care coordination needs.    Engaged with patient face to face for initial visit in response to provider referral for pharmacy case management and/or care coordination services.   Consent to Services:  The patient was given information about Chronic Care Management services, agreed to services, and gave verbal consent prior to initiation of services.  Please see initial visit note for detailed documentation.   Patient Care Team: Sharion Balloon, FNP as PCP - General (Family Medicine) Lavera Guise, [facility] (Pharmacist) Eloise Harman, DO as Consulting Physician (Gastroenterology)  Objective:  Lab Results  Component Value Date   CREATININE 1.41 (H) 08/22/2021   CREATININE 1.35 (H) 02/26/2021   CREATININE 1.52 (H) 11/21/2020    Lab Results  Component Value Date   HGBA1C 8.0 (H) 08/22/2021   Last diabetic Eye exam: No results found for: "HMDIABEYEEXA"  Last diabetic Foot exam: No results found for: "HMDIABFOOTEX"      Component Value Date/Time   CHOL 126 08/22/2021 1131   TRIG 158 (H) 08/22/2021 1131   HDL 36 (L) 08/22/2021 1131   CHOLHDL 3.5 08/22/2021 1131   [place] 63 08/22/2021 1131       Latest Ref Rng & Units 08/22/2021   11:31 AM 02/26/2021  11:26 AM 09/20/2020   11:37 AM  Hepatic Function  Total Protein 6.0 - 8.5 g/dL 6.0  6.7  6.7   Albumin 3.7 - 4.7 g/dL 3.6  4.1  4.0   AST 0 - 40 IU/L 40  40  34   ALT 0 - 32 IU/L 16  17   14    Alk Phosphatase 44 - 121 IU/L 80  71  61   Total Bilirubin 0.0 - 1.2 mg/dL 0.4  0.3  0.4     Lab Results  Component Value Date/Time   TSH 2.300 08/22/2021 11:31 AM   TSH 1.390 02/26/2021 11:26 AM       Latest Ref Rng & Units 08/22/2021   11:31 AM 02/26/2021   11:26 AM 11/21/2020   11:24 AM  CBC  WBC 3.4 - 10.8 x10E3/uL 5.8  6.9  7.2   Hemoglobin 11.1 - 15.9 g/dL 11.9  12.6  13.0   Hematocrit 34.0 - 46.6 % 37.5  39.4  40.7   Platelets 150 - 450 x10E3/uL 255  286  292     Lab Results  Component Value Date/Time   VD25OH 34.9 04/26/2020 11:32 AM    Clinical ASCVD: No  The ASCVD Risk score (Arnett DK, et al., 2019) failed to calculate for the following reasons:   The valid total cholesterol range is 130 to 320 mg/dL    Other: (CHADS2VASc if Afib, PHQ9 if depression, MMRC or CAT for COPD, ACT, DEXA)  Social History   Tobacco Use  Smoking Status Never  Smokeless Tobacco Never   BP Readings from Last 3 Encounters:  08/22/21 118/68  02/26/21 (!) 142/64  11/26/20 (!) 105/53   Pulse Readings from Last 3 Encounters:  08/22/21 71  02/26/21 84  11/26/20 83   Wt Readings from Last 3 Encounters:  08/22/21 153 lb (69.4 kg)  02/26/21 150 lb (68 kg)  11/23/20 149 lb (67.6 kg)    Assessment: Review of patient past medical history, allergies, medications, health status, including review of consultants reports, laboratory and other test data, was performed as part of comprehensive evaluation and provision of chronic care management services.   SDOH:  (Social Determinants of Health) assessments and interventions performed:    CCM Care Plan  Allergies  Allergen Reactions   Morphine And Related Swelling and Other (See Comments)    Facial swelling   Latex Rash   Statins Other (See Comments)    Myalgias     Medications Reviewed Today     Reviewed by Lavera Guise, [facility] (Pharmacist) on 10/01/21 at 1153  Med List Status: <None>   Medication Order Taking? Sig  Documenting Provider Last Dose Status Informant  Alcohol Swabs (B-D SINGLE USE SWABS REGULAR) PADS [id number]  Test BS 4 times daily Dx E11.9 Sharion Balloon, FNP  Active   BD PEN NEEDLE NANO 2ND GEN 32G X 4 MM MISC [id number]  Use twice daily with insulin Dx E11.9 [name] A, FNP  Active   Cholecalciferol 25 MCG (1000 UT) capsule [id number]  Take by mouth. [provider]  Active   cilostazol (PLETAL) 50 MG tablet [id number]  Take 1 tablet (50 mg total) by mouth 2 (two) times daily. Lorretta Harp, MD  Active   clopidogrel (PLAVIX) 75 MG tablet [id number]  Take 1 tablet (75 mg total) by mouth daily. [name] A, FNP  Active   empagliflozin (JARDIANCE) 25 MG TABS tablet [id number]  Take 25 mg by mouth daily. [provider]  Active Self  ezetimibe (ZETIA) 10 MG tablet [id number]  Take 1 tablet (10 mg total) by mouth daily. [name] A, FNP  Active   fenofibrate 160 MG tablet [id number]  Take 1 tablet (160 mg total) by mouth daily. [name] A, FNP  Active   ferrous sulfate 325 (65 FE) MG EC tablet [id number]  Take 325 mg by mouth once a week. [provider]  Expired 07/19/21 2359 Self  glucose blood (FREESTYLE LITE) test strip [id number]  Test BS TID Dx E11.9 [name] A, FNP  Active   insulin glargine, 2 Unit Dial, (TOUJEO MAX SOLOSTAR) 300 UNIT/ML Solostar Pen [id number]  INJECT 52 UNITS UNDER THE SKIN DAILY [name] A, FNP  Active   insulin lispro (HUMALOG) 100 UNIT/ML KwikPen [id number]  Inject 10-20 Units into the skin 3 (three) times daily. Sharion Balloon, FNP  Active   Lancets (FREESTYLE) lancets [id number]  Test BS TID Dx E11.9 Sharion Balloon, FNP  Active   levothyroxine (SYNTHROID) 88 MCG tablet [id number]  TAKE 1 TABLET DAILY BEFORE BREAKFAST Sharion Balloon, FNP  Active   losartan-hydrochlorothiazide (HYZAAR) 50-12.5 MG tablet [id number]  TAKE 1 TABLET DAILY Sharion Balloon, FNP  Active    Patient taking differently:   Discontinued  07/13/19 1131   pantoprazole (PROTONIX) 40 MG tablet [id number]  Take 1 tablet (40 mg total) by mouth daily. [name] A, FNP  Active   Semaglutide,0.25 or 0.5MG/DOS, 2 MG/3ML Bonney Aid [id number] No Inject 0.25 mg into the skin once a week.  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   Chronic bilateral low back pain without sciatica 07/13/2019   Carotid stenosis, right 12/10/2017   Occlusion and stenosis of right carotid artery 12/10/2017   Left hip pain 06/04/2017   Claudication in peripheral vascular disease ([place]) 10/30/2016   Vitamin D deficiency 05/07/2016   Gastroesophageal reflux disease without esophagitis 01/28/2016   Coronary artery disease due to lipid rich plaque 10/18/2015   Hypothyroidism 10/18/2015   S/P -[place] SFA PTA with restenosis  07/06/2014   Peripheral vascular angioplasty status 07/06/2014   Hypertension associated with diabetes ([place]) 06/28/2014   Hyperlipidemia-statin intolerant 06/28/2014   DM (diabetes mellitus) ([place]) 06/28/2014   Peripheral arterial disease - Bilateral SFA  06/28/2014    Immunization History  Administered Date(s) Administered   Influenza, High Dose Seasonal PF 01/28/2016, 12/04/2016, 12/10/2017   Influenza-Unspecified 01/28/2016, 12/04/2016, 12/10/2017   Pneumococcal Conjugate-13 01/28/2016   Pneumococcal Polysaccharide-23 07/07/2017   Tdap 09/20/2020   Zoster Recombinat (Shingrix) 09/20/2020, 08/22/2021    Conditions to be addressed/monitored: HLD, DMII, and CKD Stage 4  Care Plan : PHARMD MEDICATION MANAGEMENT  Updates made by Lavera Guise, RPH since 10/01/2021 12:00 AM     Problem: DISEASE PROGRESSION PREVENTION      Long-Range Goal: T2DM, HLD PHARMD GOAL   This Visit's  Progress: Not on track  Note:   Current Barriers:  Unable to independently afford treatment regimen Unable to achieve control of T2DM, HLD  Suboptimal therapeutic regimen for T2DM,HLD  Pharmacist Clinical Goal(s):  patient will verbalize ability to afford treatment regimen maintain control of T2DM as evidenced by LABS AT GOAL; IMPROVED QUALITY OF LIFE  achieve improvement in T2DM, HLD as evidenced by LABS AT GOAL  through collaboration with PharmD and provider.   Interventions: 1:1 collaboration with Sharion Balloon, FNP regarding development and update of comprehensive plan of care as evidenced by provider attestation and co-signature Inter-disciplinary care team collaboration (see longitudinal plan of care) Comprehensive medication review performed; medication list updated in electronic medical record  Diabetes: New goal. Uncontrolled--A1C 8%, GFR 38 CKD 4;  Current treatment: JARDIANCE 25MG DAILY OZEMPIC 0.25MG ( FILLED OZEMPIC ON 09/05/21? IS SHE TAKING?) Denies personal and family history of Medullary thyroid cancer (MTC) TOUJEO 50-52 UNITS AT BEDTIME HUMALOG with meals--avg 10 -12 units per patient report PER MEAL Patient did not have financial forms-->we will try to get Trulicity instead  If unable, patient will have to bring in forms and we can obtain Ozempic Current glucose readings: fasting glucose: <180, post prandial glucose: n/a Libre copay >$100 via parachute Patient does not wish to use libre CGM at this time Will revisit Denies/reports hypoglycemic/hyperglycemic symptoms Current meal patterns: breakfast: CEREAL/FRUIT--DISCUSSED LOWER CARB OPTIONS Discussed meal planning options and Plate method for healthy eating Avoid sugary drinks and desserts Incorporate balanced protein, non starchy veggies, 1 serving of carbohydrate with each meal Increase water intake Increase physical activity as able Current exercise: N/A Recommended GLP1 Assessed patient finances.  APPLICATION SUBMITTED FOR LILLY CARES PAP --TRULICITY   Hyperlipidemia:  New goal. Uncontrolled--LDL <70, WORKING ON TRIGLYCERIDES, DIETARY CHANGES;  current treatment: FENOFIBRATE, ZETIA GENERIC;  Medications previously tried: STATINS; MYOPATHY; ICD 10 G72  Current dietary patterns: RECOMMENDED FOLLOWING A HEART HEALTHY DIET/HEALTHY PLATE METHOD Educated on DISCUSSED [place] Lipid Panel     Component Value Date/Time   CHOL 126 08/22/2021 1131   TRIG 158 (H) 08/22/2021 1131   HDL 36 (L) 08/22/2021 1131   CHOLHDL 3.5 08/22/2021 1131   LDLCALC 63 08/22/2021 1131   LABVLDL 27 08/22/2021 1131  Patient Goals/Self-Care Activities patient will:  - take medications as prescribed as evidenced by patient report and record review check glucose 3 TIMES DAILY, DISCUSSED CGM, document, and provide at future appointments collaborate with provider on medication access solutions target a minimum of 150 minutes of moderate intensity exercise weekly engage in dietary modifications by   FOLLOWING A HEART HEALTHY DIET/HEALTHY PLATE METHOD       Medication Assistance: Application for TRULICITY/LILLY CARES  medication assistance program. in process.  Anticipated assistance start date TBD.  See plan of care for additional detail.  Follow Up:  Patient agrees to Care Plan and Follow-up.  Plan: Telephone follow up appointment with care management team member scheduled for:  3-4 WEEKS   Regina Eck, PharmD, BCPS Clinical Pharmacist, [place]  II Phone [phone]

## 2021-09-26 ENCOUNTER — Ambulatory Visit: Payer: Medicare (Managed Care)

## 2021-09-30 ENCOUNTER — Encounter: Payer: Self-pay | Admitting: *Deleted

## 2021-09-30 ENCOUNTER — Ambulatory Visit: Payer: Self-pay | Admitting: *Deleted

## 2021-09-30 DIAGNOSIS — Z794 Long term (current) use of insulin: Secondary | ICD-10-CM

## 2021-09-30 NOTE — Patient Instructions (Signed)
Jessica Torres  I have previously worked with you through the Chronic Care Management Program at [facility]. Due to program changes I am removing myself from your care team because you've either met our goals, your conditions are stable and no longer require care management, or we haven't engaged within the past 6 months. If you are currently active with another CCM Team Member, you will remain active with them unless they reach out to you with additional information. If you feel that you need RN Care Management services in the future, please talk with your primary care provider to discuss re-engagement with the RN Care Manager that will be assigned to [facility]. This does not affect your status as a patient at [facility].   Thank you for allowing me to participate in your your healthcare journey.  Chong Sicilian, BSN, RN-BC Embedded Chronic Care Manager [facility] [place] [facility] Direct Dial: [phone]

## 2021-09-30 NOTE — Chronic Care Management (AMB) (Signed)
Erroneous encounter. Please disregard.

## 2021-09-30 NOTE — Chronic Care Management (AMB) (Signed)
  Chronic Care Management   Note  09/30/2021 Name: Jessica Torres MRN: [id number] DOB: Sep 22, 1940   Patient has either met RN Care Management goals, is stable from RN Care Management perspective, or has not recently engaged with the RN Care Manager. I am removing RN Care Manager from Care Team and closing [place]. If patient is currently engaged with another CCM team member I will forward this encounter to inform them of my case closure. Patient may be eligible for re-engagement with RN Care Manager in the future if necessary and can discuss this with their PCP.  Chong Sicilian, BSN, RN-BC Embedded Chronic Care Manager [facility] [place] [facility] Direct Dial: [phone]

## 2021-10-01 NOTE — Patient Instructions (Addendum)
Visit Information  Following are the goals we discussed today:  Current Barriers:  Unable to independently afford treatment regimen Unable to achieve control of T2DM, HLD  Suboptimal therapeutic regimen for T2DM,HLD  Pharmacist Clinical Goal(s):  patient will verbalize ability to afford treatment regimen maintain control of T2DM as evidenced by LABS AT GOAL; IMPROVED QUALITY OF LIFE  achieve improvement in T2DM, HLD as evidenced by LABS AT GOAL through collaboration with PharmD and provider.   Interventions: 1:1 collaboration with Sharion Balloon, FNP regarding development and update of comprehensive plan of care as evidenced by provider attestation and co-signature Inter-disciplinary care team collaboration (see longitudinal plan of care) Comprehensive medication review performed; medication list updated in electronic medical record  Diabetes: New goal. Uncontrolled--A1C 8%, GFR 38 CKD 4;  Current treatment: JARDIANCE '25MG'$  DAILY OZEMPIC 0.'25MG'$  ( FILLED OZEMPIC ON 09/05/21? IS SHE TAKING?) Denies personal and family history of Medullary thyroid cancer (MTC) TOUJEO 50-52 UNITS AT BEDTIME HUMALOG with meals--avg 10 -12 units per patient report PER MEAL Patient did not have financial forms-->we will try to get Trulicity instead  If unable, patient will have to bring in forms and we can obtain Ozempic Current glucose readings: fasting glucose: <180, post prandial glucose: n/a Libre copay >$100 via parachute Patient does not wish to use libre CGM at this time Will revisit Denies/reports hypoglycemic/hyperglycemic symptoms Current meal patterns: breakfast: CEREAL/FRUIT--DISCUSSED LOWER CARB OPTIONS Discussed meal planning options and Plate method for healthy eating Avoid sugary drinks and desserts Incorporate balanced protein, non starchy veggies, 1 serving of carbohydrate with each meal Increase water intake Increase physical activity as able Current exercise: N/A Recommended  GLP1 Assessed patient finances. APPLICATION SUBMITTED FOR LILLY CARES PAP --TRULICITY   Hyperlipidemia:  New goal. Uncontrolled--LDL <70, WORKING ON TRIGLYCERIDES, DIETARY CHANGES;  current treatment: FENOFIBRATE, ZETIA GENERIC;  Medications previously tried: STATINS; MYOPATHY; ICD 10 G72  Current dietary patterns: RECOMMENDED FOLLOWING A HEART HEALTHY DIET/HEALTHY PLATE METHOD Educated on DISCUSSED [place] Lipid Panel     Component Value Date/Time   CHOL 126 08/22/2021 1131   TRIG 158 (H) 08/22/2021 1131   HDL 36 (L) 08/22/2021 1131   CHOLHDL 3.5 08/22/2021 1131   LDLCALC 63 08/22/2021 1131   LABVLDL 27 08/22/2021 1131   Patient Goals/Self-Care Activities patient will:  - take medications as prescribed as evidenced by patient report and record review check glucose 3 TIMES DAILY, DISCUSSED CGM, document, and provide at future appointments collaborate with provider on medication access solutions target a minimum of 150 minutes of moderate intensity exercise weekly engage in dietary modifications by FOLLOWING A HEART HEALTHY DIET/HEALTHY PLATE METHOD    Plan: Telephone follow up appointment with care management team member scheduled for:  3 WEEKS  Signature Regina Eck, PharmD, BCPS Clinical Pharmacist, [place]  II Phone [phone]   Please call the care guide team at [phone] if you need to cancel or reschedule your appointment.   The patient verbalized understanding of instructions, educational materials, and care plan provided today and DECLINED offer to receive copy of patient instructions, educational materials, and care plan.

## 2021-10-02 ENCOUNTER — Telehealth: Payer: Medicare (Managed Care)

## 2021-10-03 ENCOUNTER — Other Ambulatory Visit: Payer: Self-pay | Admitting: Family

## 2021-10-03 DIAGNOSIS — Z794 Long term (current) use of insulin: Secondary | ICD-10-CM

## 2021-10-03 DIAGNOSIS — N184 Chronic kidney disease, stage 4 (severe): Secondary | ICD-10-CM

## 2021-10-07 DIAGNOSIS — Z794 Long term (current) use of insulin: Secondary | ICD-10-CM

## 2021-10-07 DIAGNOSIS — N184 Chronic kidney disease, stage 4 (severe): Secondary | ICD-10-CM

## 2021-10-07 DIAGNOSIS — E1159 Type 2 diabetes mellitus with other circulatory complications: Secondary | ICD-10-CM

## 2021-10-08 DIAGNOSIS — N189 Chronic kidney disease, unspecified: Secondary | ICD-10-CM | POA: Diagnosis not present

## 2021-10-08 DIAGNOSIS — I129 Hypertensive chronic kidney disease with stage 1 through stage 4 chronic kidney disease, or unspecified chronic kidney disease: Secondary | ICD-10-CM | POA: Diagnosis not present

## 2021-10-08 DIAGNOSIS — E1122 Type 2 diabetes mellitus with diabetic chronic kidney disease: Secondary | ICD-10-CM | POA: Diagnosis not present

## 2021-10-08 DIAGNOSIS — R809 Proteinuria, unspecified: Secondary | ICD-10-CM | POA: Diagnosis not present

## 2021-10-08 DIAGNOSIS — E8721 Acute metabolic acidosis: Secondary | ICD-10-CM | POA: Diagnosis not present

## 2021-10-08 DIAGNOSIS — E1129 Type 2 diabetes mellitus with other diabetic kidney complication: Secondary | ICD-10-CM | POA: Diagnosis not present

## 2021-10-08 DIAGNOSIS — E875 Hyperkalemia: Secondary | ICD-10-CM | POA: Diagnosis not present

## 2021-10-08 DIAGNOSIS — E871 Hypo-osmolality and hyponatremia: Secondary | ICD-10-CM | POA: Diagnosis not present

## 2021-10-08 DIAGNOSIS — E559 Vitamin D deficiency, unspecified: Secondary | ICD-10-CM | POA: Diagnosis not present

## 2021-10-10 ENCOUNTER — Other Ambulatory Visit: Payer: Self-pay | Admitting: Family

## 2021-10-10 DIAGNOSIS — E1159 Type 2 diabetes mellitus with other circulatory complications: Secondary | ICD-10-CM

## 2021-10-10 DIAGNOSIS — I2510 Atherosclerotic heart disease of native coronary artery without angina pectoris: Secondary | ICD-10-CM

## 2021-10-10 DIAGNOSIS — N184 Chronic kidney disease, stage 4 (severe): Secondary | ICD-10-CM

## 2021-10-16 ENCOUNTER — Other Ambulatory Visit: Payer: Self-pay | Admitting: Family

## 2021-10-16 DIAGNOSIS — Z1231 Encounter for screening mammogram for malignant neoplasm of breast: Secondary | ICD-10-CM

## 2021-10-24 ENCOUNTER — Ambulatory Visit (INDEPENDENT_AMBULATORY_CARE_PROVIDER_SITE_OTHER): Payer: Medicare (Managed Care) | Admitting: Pharmacist

## 2021-10-24 DIAGNOSIS — G720 Drug-induced myopathy: Secondary | ICD-10-CM

## 2021-10-24 DIAGNOSIS — Z794 Long term (current) use of insulin: Secondary | ICD-10-CM

## 2021-10-24 DIAGNOSIS — N184 Chronic kidney disease, stage 4 (severe): Secondary | ICD-10-CM

## 2021-10-24 DIAGNOSIS — I2510 Atherosclerotic heart disease of native coronary artery without angina pectoris: Secondary | ICD-10-CM

## 2021-10-31 NOTE — Patient Instructions (Addendum)
Visit Information  Following are the goals we discussed today:  Current Barriers:  Unable to independently afford treatment regimen Unable to achieve control of T2DM, HLD  Suboptimal therapeutic regimen for T2DM,HLD  Pharmacist Clinical Goal(s):  patient will verbalize ability to afford treatment regimen maintain control of T2DM as evidenced by LABS AT GOAL; IMPROVED QUALITY OF LIFE  achieve improvement in T2DM, HLD as evidenced by LABS AT GOAL through collaboration with PharmD and provider.   Interventions: 1:1 collaboration with Sharion Balloon, FNP regarding development and update of comprehensive plan of care as evidenced by provider attestation and co-signature Inter-disciplinary care team collaboration (see longitudinal plan of care) Comprehensive medication review performed; medication list updated in electronic medical record  Diabetes: Goal on Track (progressing): YES. Uncontrolled--A1C 8%, GFR 38 CKD 4;  Current treatment: JARDIANCE '25MG'$  DAILY OZEMPIC 0.'25MG'$   sq weekly ( FILLED OZEMPIC ON 09/05/21? IS SHE TAKING?) Denies personal and family history of Medullary thyroid cancer (MTC) Sample given TOUJEO 50-52 UNITS AT BEDTIME -->basaglar (able to get free) HUMALOG with meals--avg 10 -12 units per patient report PER MEAL Patient to bring me financial forms-->trulicity denied Will attempt to get ozempic and jardiance once forms received from patient   Current glucose readings: fasting glucose: <180, post prandial glucose: n/a Libre copay >$100 via parachute Patient does not wish to use libre CGM at this time Will revisit Denies/reports hypoglycemic/hyperglycemic symptoms Current meal patterns: breakfast: CEREAL/FRUIT--DISCUSSED LOWER CARB OPTIONS Discussed meal planning options and Plate method for healthy eating Avoid sugary drinks and desserts Incorporate balanced protein, non starchy veggies, 1 serving of carbohydrate with each meal Increase water intake Increase  physical activity as able Current exercise: N/A Recommended GLP1 Assessed patient finances. APPLICATION denied by lilly cares (trulicity), approved for basaglar insulin; will attempt other programs when patient brings in forms   Hyperlipidemia:  New goal. Uncontrolled--LDL <70, WORKING ON TRIGLYCERIDES, DIETARY CHANGES;  current treatment: FENOFIBRATE, ZETIA GENERIC;  Medications previously tried: STATINS; MYOPATHY; ICD 10 G72  Current dietary patterns: RECOMMENDED FOLLOWING A HEART HEALTHY DIET/HEALTHY PLATE METHOD Educated on [place] Lipid Panel     Component Value Date/Time   CHOL 126 08/22/2021 1131   TRIG 158 (H) 08/22/2021 1131   HDL 36 (L) 08/22/2021 1131   CHOLHDL 3.5 08/22/2021 1131   [place] 63 08/22/2021 1131   LABVLDL 27 08/22/2021 1131   Patient Goals/Self-Care Activities patient will:  - take medications as prescribed as evidenced by patient report and record review check glucose 3 TIMES DAILY, DISCUSSED CGM, document, and provide at future appointments collaborate with provider on medication access solutions target a minimum of 150 minutes of moderate intensity exercise weekly engage in dietary modifications by FOLLOWING A HEART HEALTHY DIET/HEALTHY PLATE METHOD    Plan: Telephone follow up appointment with care management team member scheduled for:  4 weeks  Signature Regina Eck, PharmD, BCPS Clinical Pharmacist, [place]  II Phone [phone]   Please call the care guide team at [phone] if you need to cancel or reschedule your appointment.   The patient verbalized understanding of instructions, educational materials, and care plan provided today and DECLINED offer to receive copy of patient instructions, educational materials, and care plan.

## 2021-10-31 NOTE — Progress Notes (Signed)
Chronic Care Management Pharmacy Note  10/24/2021 Name:  Jessica Torres MRN:  [id number] DOB:  Apr 10, 1940  Summary: Diabetes: Goal on Track (progressing): YES. Uncontrolled--A1C 8%, GFR 38 CKD 4;  Current treatment: JARDIANCE 25MG DAILY OZEMPIC 0.25MG  sq weekly ( FILLED OZEMPIC ON 09/05/21? IS SHE TAKING?) Denies personal and family history of Medullary thyroid cancer (MTC) Sample given TOUJEO 50-52 UNITS AT BEDTIME -->basaglar (able to get free) HUMALOG with meals--avg 10 -12 units per patient report PER MEAL Patient to bring me financial forms-->trulicity denied Will attempt to get ozempic and jardiance once forms received from patient   Current glucose readings: fasting glucose: <180, post prandial glucose: n/a Libre copay >$100 via parachute Patient does not wish to use libre CGM at this time Will revisit Denies/reports hypoglycemic/hyperglycemic symptoms Current meal patterns: breakfast: CEREAL/FRUIT--DISCUSSED LOWER CARB OPTIONS Discussed meal planning options and Plate method for healthy eating Avoid sugary drinks and desserts Incorporate balanced protein, non starchy veggies, 1 serving of carbohydrate with each meal Increase water intake Increase physical activity as able Current exercise: N/A Recommended GLP1 Assessed patient finances. APPLICATION denied by [facility] (trulicity), approved for basaglar insulin; will attempt other programs when patient brings in forms   Hyperlipidemia:  New goal. Uncontrolled--LDL <70, WORKING ON TRIGLYCERIDES, DIETARY CHANGES;  current treatment: FENOFIBRATE, ZETIA GENERIC;  Medications previously tried: STATINS; MYOPATHY; ICD 10 G72  Current dietary patterns: RECOMMENDED FOLLOWING A HEART HEALTHY DIET/HEALTHY PLATE METHOD Educated on DISCUSSED [place] Lipid Panel     Component Value Date/Time   CHOL 126 08/22/2021 1131   TRIG 158 (H) 08/22/2021 1131   HDL 36 (L) 08/22/2021 1131   CHOLHDL 3.5 08/22/2021 1131    [place] 63 08/22/2021 1131   LABVLDL 27 08/22/2021 1131   Patient Goals/Self-Care Activities patient will:  - take medications as prescribed as evidenced by patient report and record review check glucose 3 TIMES DAILY, DISCUSSED CGM, document, and provide at future appointments collaborate with provider on medication access solutions target a minimum of 150 minutes of moderate intensity exercise weekly engage in dietary modifications by FOLLOWING A HEART HEALTHY DIET/HEALTHY PLATE METHOD   Subjective: Jessica Torres is an 81 y.o. year old female who is a primary patient of Sharion Balloon, FNP.  The CCM team was consulted for assistance with disease management and care coordination needs.    Engaged with patient by telephone for follow up visit in response to provider referral for pharmacy case management and/or care coordination services.   Consent to Services:  The patient was given information about Chronic Care Management services, agreed to services, and gave verbal consent prior to initiation of services.  Please see initial visit note for detailed documentation.   Patient Care Team: Sharion Balloon, FNP as PCP - General (Family Medicine) Lavera Guise, [facility] (Pharmacist) Eloise Harman, DO as Consulting Physician (Gastroenterology)   Objective:  Lab Results  Component Value Date   CREATININE 1.41 (H) 08/22/2021   CREATININE 1.35 (H) 02/26/2021   CREATININE 1.52 (H) 11/21/2020    Lab Results  Component Value Date   HGBA1C 8.0 (H) 08/22/2021   Last diabetic Eye exam: No results found for: "HMDIABEYEEXA"  Last diabetic Foot exam: No results found for: "HMDIABFOOTEX"      Component Value Date/Time   CHOL 126 08/22/2021 1131   TRIG 158 (H) 08/22/2021 1131   HDL 36 (L) 08/22/2021 1131   CHOLHDL 3.5 08/22/2021 1131   [place] 63 08/22/2021 1131  Latest Ref Rng & Units 08/22/2021   11:31 AM 02/26/2021   11:26 AM 09/20/2020   11:37 AM  Hepatic Function   Total Protein 6.0 - 8.5 g/dL 6.0  6.7  6.7   Albumin 3.7 - 4.7 g/dL 3.6  4.1  4.0   AST 0 - 40 IU/L 40  40  34   ALT 0 - 32 IU/L 16  17  14    Alk Phosphatase 44 - 121 IU/L 80  71  61   Total Bilirubin 0.0 - 1.2 mg/dL 0.4  0.3  0.4     Lab Results  Component Value Date/Time   TSH 2.300 08/22/2021 11:31 AM   TSH 1.390 02/26/2021 11:26 AM       Latest Ref Rng & Units 08/22/2021   11:31 AM 02/26/2021   11:26 AM 11/21/2020   11:24 AM  CBC  WBC 3.4 - 10.8 x10E3/uL 5.8  6.9  7.2   Hemoglobin 11.1 - 15.9 g/dL 11.9  12.6  13.0   Hematocrit 34.0 - 46.6 % 37.5  39.4  40.7   Platelets 150 - 450 x10E3/uL 255  286  292     Lab Results  Component Value Date/Time   VD25OH 34.9 04/26/2020 11:32 AM    Clinical ASCVD: No  The ASCVD Risk score (Arnett DK, et al., 2019) failed to calculate for the following reasons:   The valid total cholesterol range is 130 to 320 mg/dL    Other: (CHADS2VASc if Afib, PHQ9 if depression, MMRC or CAT for COPD, ACT, DEXA)  Social History   Tobacco Use  Smoking Status Never  Smokeless Tobacco Never   BP Readings from Last 3 Encounters:  08/22/21 118/68  02/26/21 (!) 142/64  11/26/20 (!) 105/53   Pulse Readings from Last 3 Encounters:  08/22/21 71  02/26/21 84  11/26/20 83   Wt Readings from Last 3 Encounters:  08/22/21 153 lb (69.4 kg)  02/26/21 150 lb (68 kg)  11/23/20 149 lb (67.6 kg)    Assessment: Review of patient past medical history, allergies, medications, health status, including review of consultants reports, laboratory and other test data, was performed as part of comprehensive evaluation and provision of chronic care management services.   SDOH:  (Social Determinants of Health) assessments and interventions performed:    CCM Care Plan  Allergies  Allergen Reactions   Morphine And Related Swelling and Other (See Comments)    Facial swelling   Latex Rash   Statins Other (See Comments)    Myalgias     Medications Reviewed  Today     Reviewed by Lavera Guise, [facility] (Pharmacist) on 10/31/21 at 1110  Med List Status: <None>   Medication Order Taking? Sig Documenting Provider Last Dose Status Informant  Alcohol Swabs (B-D SINGLE USE SWABS REGULAR) PADS [id number] No Test BS 4 times daily Dx E11.9 Sharion Balloon, FNP Taking Active   BD PEN NEEDLE NANO 2ND GEN 32G X 4 MM MISC [id number] No Use twice daily with insulin Dx E11.9 [name] A, FNP Taking Active   Cholecalciferol 25 MCG (1000 UT) capsule [id number]  Take by mouth. [provider]  Active   cilostazol (PLETAL) 50 MG tablet [id number] No Take 1 tablet (50 mg total) by mouth 2 (two) times daily. Lorretta Harp, MD Taking Active   clopidogrel (PLAVIX) 75 MG tablet [id number]  Take 1 tablet (75 mg total) by mouth daily. [name] A, FNP  Active   empagliflozin (JARDIANCE) 25 MG  TABS tablet [id number] No Take 25 mg by mouth daily. [provider] Taking Active Self  ezetimibe (ZETIA) 10 MG tablet [id number]  Take 1 tablet (10 mg total) by mouth daily. [name] A, FNP  Active   fenofibrate 160 MG tablet [id number]  Take 1 tablet (160 mg total) by mouth daily. [name] A, FNP  Active   ferrous sulfate 325 (65 FE) MG EC tablet [id number] No Take 325 mg by mouth once a week. [provider] Taking Expired 07/19/21 2359 Self  glucose blood (FREESTYLE LITE) test strip [id number] No Test BS TID Dx E11.9 [name] A, FNP Taking Active   insulin glargine, 2 Unit Dial, (TOUJEO MAX SOLOSTAR) 300 UNIT/ML Solostar Pen [id number]  INJECT 52 UNITS UNDER THE SKIN DAILY [name] A, FNP  Active   insulin lispro (HUMALOG) 100 UNIT/ML KwikPen [id number]  Inject 10-20 Units into the skin 3 (three) times daily. Sharion Balloon, FNP  Active   Lancets (FREESTYLE) lancets [id number] No Test BS TID Dx E11.9 Sharion Balloon, FNP Taking Active   levothyroxine (SYNTHROID) 88 MCG tablet [id number] No TAKE 1 TABLET DAILY BEFORE BREAKFAST Sharion Balloon, FNP Taking Active   losartan-hydrochlorothiazide (HYZAAR) 50-12.5 MG tablet [id number] No TAKE 1 TABLET DAILY Sharion Balloon, FNP Taking Active   Patient taking differently:  Discontinued 07/13/19 1131   pantoprazole (PROTONIX) 40 MG tablet [id number]  Take 1 tablet (40 mg total) by mouth daily. [name] A, FNP  Active   Semaglutide,0.25 or 0.5MG/DOS, 2 MG/3ML Bon[place] Aid [id number] No Inject 0.25 mg into the skin once a week.  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   Chronic bilateral low back pain without sciatica 07/13/2019   Carotid stenosis, right 12/10/2017   Occlusion and stenosis of right carotid artery 12/10/2017   Left hip pain 06/04/2017   Claudication in peripheral vascular disease ([place]) 10/30/2016   Vitamin D deficiency 05/07/2016   Gastroesophageal reflux disease without esophagitis 01/28/2016   Coronary artery disease due to lipid rich plaque 10/18/2015   Hypothyroidism 10/18/2015   S/P -[place] SFA PTA with restenosis  07/06/2014   Peripheral vascular angioplasty status 07/06/2014   Hypertension associated with diabetes ([place]) 06/28/2014   Hyperlipidemia-statin intolerant 06/28/2014   DM (diabetes mellitus) ([place]) 06/28/2014   Peripheral arterial disease - Bilateral SFA  06/28/2014    Immunization History  Administered Date(s) Administered   Influenza, High Dose Seasonal PF 01/28/2016, 12/04/2016, 12/10/2017   Influenza-Unspecified 01/28/2016, 12/04/2016, 12/10/2017   Pneumococcal Conjugate-13 01/28/2016   Pneumococcal Polysaccharide-23 07/07/2017   Tdap 09/20/2020   Zoster Recombinat (Shingrix) 09/20/2020, 08/22/2021    Conditions to be addressed/monitored: DMII and CKD Stage 4  Care Plan : PHARMD  MEDICATION MANAGEMENT  Updates made by Lavera Guise, RPH since 10/31/2021 12:00 AM     Problem: DISEASE PROGRESSION PREVENTION      Long-Range Goal: T2DM, HLD PHARMD GOAL   Recent Progress: Not on track  Note:   Current Barriers:  Unable to independently afford treatment regimen Unable to achieve control of T2DM, HLD  Suboptimal therapeutic regimen for T2DM,HLD  Pharmacist Clinical Goal(s):  patient will verbalize ability to afford treatment regimen maintain control of T2DM  as evidenced by LABS AT GOAL; IMPROVED QUALITY OF LIFE  achieve improvement in T2DM, HLD as evidenced by LABS AT GOAL  through collaboration with PharmD and provider.   Interventions: 1:1 collaboration with Sharion Balloon, FNP regarding development and update of comprehensive plan of care as evidenced by provider attestation and co-signature Inter-disciplinary care team collaboration (see longitudinal plan of care) Comprehensive medication review performed; medication list updated in electronic medical record  Diabetes: Goal on Track (progressing): YES. Uncontrolled--A1C 8%, GFR 38 CKD 4;  Current treatment: JARDIANCE 25MG DAILY OZEMPIC 0.25MG  sq weekly ( FILLED OZEMPIC ON 09/05/21? IS SHE TAKING?) Denies personal and family history of Medullary thyroid cancer (MTC) Sample given TOUJEO 50-52 UNITS AT BEDTIME -->basaglar (able to get free) HUMALOG with meals--avg 10 -12 units per patient report PER MEAL Patient to bring me financial forms-->trulicity denied Will attempt to get ozempic and jardiance once forms received from patient   Current glucose readings: fasting glucose: <180, post prandial glucose: n/a Libre copay >$100 via parachute Patient does not wish to use libre CGM at this time Will revisit Denies/reports hypoglycemic/hyperglycemic symptoms Current meal patterns: breakfast: CEREAL/FRUIT--DISCUSSED LOWER CARB OPTIONS Discussed meal planning options and Plate method for healthy eating Avoid  sugary drinks and desserts Incorporate balanced protein, non starchy veggies, 1 serving of carbohydrate with each meal Increase water intake Increase physical activity as able Current exercise: N/A Recommended GLP1 Assessed patient finances. APPLICATION denied by [facility] (trulicity), approved for basaglar insulin; will attempt other programs when patient brings in forms   Hyperlipidemia:  New goal. Uncontrolled--LDL <70, WORKING ON TRIGLYCERIDES, DIETARY CHANGES;  current treatment: FENOFIBRATE, ZETIA GENERIC;  Medications previously tried: STATINS; MYOPATHY; ICD 10 G72  Current dietary patterns: RECOMMENDED FOLLOWING A HEART HEALTHY DIET/HEALTHY PLATE METHOD Educated on [place] Lipid Panel     Component Value Date/Time   CHOL 126 08/22/2021 1131   TRIG 158 (H) 08/22/2021 1131   HDL 36 (L) 08/22/2021 1131   CHOLHDL 3.5 08/22/2021 1131   [place] 63 08/22/2021 1131   LABVLDL 27 08/22/2021 1131  Patient Goals/Self-Care Activities patient will:  - take medications as prescribed as evidenced by patient report and record review check glucose 3 TIMES DAILY, DISCUSSED CGM, document, and provide at future appointments collaborate with provider on medication access solutions target a minimum of 150 minutes of moderate intensity exercise weekly engage in dietary modifications by   FOLLOWING A HEART HEALTHY DIET/HEALTHY PLATE METHOD      Plan: Telephone follow up appointment with care management team member scheduled for:  4 weeks   Regina Eck, PharmD, BCPS Clinical Pharmacist, [place] Phone [phone]

## 2021-11-07 DIAGNOSIS — E1159 Type 2 diabetes mellitus with other circulatory complications: Secondary | ICD-10-CM

## 2021-11-07 DIAGNOSIS — E785 Hyperlipidemia, unspecified: Secondary | ICD-10-CM

## 2021-11-07 DIAGNOSIS — Z794 Long term (current) use of insulin: Secondary | ICD-10-CM

## 2021-11-08 DIAGNOSIS — E1129 Type 2 diabetes mellitus with other diabetic kidney complication: Secondary | ICD-10-CM | POA: Diagnosis not present

## 2021-11-08 DIAGNOSIS — Z79899 Other long term (current) drug therapy: Secondary | ICD-10-CM | POA: Diagnosis not present

## 2021-11-08 DIAGNOSIS — E1122 Type 2 diabetes mellitus with diabetic chronic kidney disease: Secondary | ICD-10-CM | POA: Diagnosis not present

## 2021-11-08 DIAGNOSIS — N189 Chronic kidney disease, unspecified: Secondary | ICD-10-CM | POA: Diagnosis not present

## 2021-11-08 DIAGNOSIS — E875 Hyperkalemia: Secondary | ICD-10-CM | POA: Diagnosis not present

## 2021-11-08 DIAGNOSIS — R809 Proteinuria, unspecified: Secondary | ICD-10-CM | POA: Diagnosis not present

## 2021-11-14 DIAGNOSIS — E875 Hyperkalemia: Secondary | ICD-10-CM | POA: Diagnosis not present

## 2021-11-14 DIAGNOSIS — E1129 Type 2 diabetes mellitus with other diabetic kidney complication: Secondary | ICD-10-CM | POA: Diagnosis not present

## 2021-11-14 DIAGNOSIS — E871 Hypo-osmolality and hyponatremia: Secondary | ICD-10-CM | POA: Diagnosis not present

## 2021-11-14 DIAGNOSIS — R809 Proteinuria, unspecified: Secondary | ICD-10-CM | POA: Diagnosis not present

## 2021-11-14 DIAGNOSIS — N189 Chronic kidney disease, unspecified: Secondary | ICD-10-CM | POA: Diagnosis not present

## 2021-11-14 DIAGNOSIS — I129 Hypertensive chronic kidney disease with stage 1 through stage 4 chronic kidney disease, or unspecified chronic kidney disease: Secondary | ICD-10-CM | POA: Diagnosis not present

## 2021-11-14 DIAGNOSIS — E1122 Type 2 diabetes mellitus with diabetic chronic kidney disease: Secondary | ICD-10-CM | POA: Diagnosis not present

## 2021-11-14 DIAGNOSIS — E559 Vitamin D deficiency, unspecified: Secondary | ICD-10-CM | POA: Diagnosis not present

## 2021-11-22 ENCOUNTER — Telehealth: Payer: Medicare (Managed Care)

## 2021-11-25 ENCOUNTER — Telehealth: Payer: Self-pay | Admitting: Family

## 2021-11-25 DIAGNOSIS — Z794 Long term (current) use of insulin: Secondary | ICD-10-CM

## 2021-11-25 MED ORDER — BD PEN NEEDLE NANO 2ND GEN 32G X 4 MM MISC: 3 refills | Status: DC

## 2021-11-25 MED ORDER — FREESTYLE LITE TEST VI STRP: ORAL_STRIP | 3 refills | Status: DC

## 2021-11-25 MED ORDER — FREESTYLE LANCETS MISC: 3 refills | Status: DC

## 2021-11-25 NOTE — Telephone Encounter (Signed)
  Prescription Request  11/25/2021  Is this a "Controlled Substance" medicine? no  Have you seen your PCP in the last 2 weeks? no  If YES, route message to pool  -  If NO, patient needs to be scheduled for appointment.  What is the name of the medication or equipment? BD Pen Needle Nano 2nd Gen 32G X 4 MM MISC, Freestyle Lite test strips, Freestyle Lite needles (Flat and white)  Have you contacted your pharmacy to request a refill? yes   Which pharmacy would you like this sent to? [facility]   Patient notified that their request is being sent to the clinical staff for review and that they should receive a response within 2 business days.

## 2021-11-25 NOTE — Telephone Encounter (Signed)
NA/NVM pen needles, test strips and lancets sent to [facility]

## 2021-11-28 ENCOUNTER — Ambulatory Visit (INDEPENDENT_AMBULATORY_CARE_PROVIDER_SITE_OTHER): Payer: Medicare (Managed Care) | Admitting: Family

## 2021-11-28 ENCOUNTER — Encounter: Payer: Self-pay | Admitting: Family

## 2021-11-28 ENCOUNTER — Ambulatory Visit (INDEPENDENT_AMBULATORY_CARE_PROVIDER_SITE_OTHER): Payer: Medicare (Managed Care)

## 2021-11-28 VITALS — BP 159/68 | HR 94 | Temp 97.0°F | Ht 65.0 in | Wt 148.0 lb

## 2021-11-28 DIAGNOSIS — E1159 Type 2 diabetes mellitus with other circulatory complications: Secondary | ICD-10-CM

## 2021-11-28 DIAGNOSIS — M545 Low back pain, unspecified: Secondary | ICD-10-CM

## 2021-11-28 DIAGNOSIS — N184 Chronic kidney disease, stage 4 (severe): Secondary | ICD-10-CM | POA: Diagnosis not present

## 2021-11-28 DIAGNOSIS — Z794 Long term (current) use of insulin: Secondary | ICD-10-CM | POA: Diagnosis not present

## 2021-11-28 LAB — CMP14+EGFR
ALT: 18 IU/L (ref 0–32)
AST: 53 IU/L — ABNORMAL HIGH (ref 0–40)
Albumin/Globulin Ratio: 1.2 (ref 1.2–2.2)
Albumin: 3.6 g/dL — ABNORMAL LOW (ref 3.8–4.8)
Alkaline Phosphatase: 83 IU/L (ref 44–121)
BUN/Creatinine Ratio: 10 — ABNORMAL LOW (ref 12–28)
BUN: 15 mg/dL (ref 8–27)
Bilirubin Total: 0.6 mg/dL (ref 0.0–1.2)
CO2: 22 mmol/L (ref 20–29)
Calcium: 9.7 mg/dL (ref 8.7–10.3)
Chloride: 99 mmol/L (ref 96–106)
Creatinine, Ser: 1.57 mg/dL — ABNORMAL HIGH (ref 0.57–1.00)
Globulin, Total: 3 g/dL (ref 1.5–4.5)
Glucose: 394 mg/dL — ABNORMAL HIGH (ref 70–99)
Potassium: 4.6 mmol/L (ref 3.5–5.2)
Sodium: 133 mmol/L — ABNORMAL LOW (ref 134–144)
Total Protein: 6.6 g/dL (ref 6.0–8.5)
eGFR: 33 mL/min/{1.73_m2} — ABNORMAL LOW (ref >59)

## 2021-11-28 LAB — BAYER DCA HB A1C WAIVED: HB A1C (BAYER DCA - WAIVED): 7.8 % — ABNORMAL HIGH (ref 4.8–5.6)

## 2021-11-28 MED ORDER — BACLOFEN 10 MG PO TABS: 10.0000 mg | ORAL_TABLET | Freq: Three times a day (TID) | ORAL | 0 refills | Status: DC

## 2021-11-28 NOTE — Progress Notes (Signed)
Subjective:    Patient ID: Jessica Torres, female    DOB: September 21, 1940, 81 y.o.   MRN: [id number]  Chief Complaint  Patient presents with   Back Pain    Right side pain patient wants MRI, cyst on right kidney     PT presents to the office today with right back pain that started two months ago. She has CKD and avoid NSAID's.  Back Pain This is a new problem. The current episode started more than 1 month ago. The problem occurs intermittently. The problem has been waxing and waning since onset. The pain is present in the lumbar spine. The quality of the pain is described as stabbing and aching. The pain is at a severity of 9/10. The pain is moderate. The symptoms are aggravated by twisting, standing and lying down. Pertinent negatives include no leg pain, tingling, weakness or weight loss. Risk factors include obesity and menopause. She has tried bed rest, heat and NSAIDs for the symptoms. The treatment provided mild relief.  Diabetes She presents for her follow-up diabetic visit. She has type 2 diabetes mellitus. Pertinent negatives for diabetes include no blurred vision, no foot paresthesias, no weakness and no weight loss. Symptoms are stable. Risk factors for coronary artery disease include dyslipidemia, diabetes mellitus, hypertension, sedentary lifestyle and post-menopausal. She is following a generally unhealthy diet. Her overall blood glucose range is 140-180 mg/dl.      Review of Systems  Constitutional:  Negative for weight loss.  Eyes:  Negative for blurred vision.  Musculoskeletal:  Positive for back pain.  Neurological:  Negative for tingling and weakness.  All other systems reviewed and are negative.      Objective:   Physical Exam Vitals reviewed.  Constitutional:      General: She is not in acute distress.    Appearance: She is well-developed.  HENT:     Head: Normocephalic and atraumatic.     Right Ear: Tympanic membrane normal.     Left Ear: Tympanic membrane  normal.  Eyes:     Pupils: Pupils are equal, round, and reactive to light.  Neck:     Thyroid: No thyromegaly.  Cardiovascular:     Rate and Rhythm: Normal rate and regular rhythm.     Heart sounds: Normal heart sounds. No murmur heard. Pulmonary:     Effort: Pulmonary effort is normal. No respiratory distress.     Breath sounds: Normal breath sounds. No wheezing.  Abdominal:     General: Bowel sounds are normal. There is no distension.     Palpations: Abdomen is soft.     Tenderness: There is no abdominal tenderness.  Musculoskeletal:        General: No tenderness.     Cervical back: Normal range of motion and neck supple.     Comments: Pain in right lumbar with flexion  Skin:    General: Skin is warm and dry.  Neurological:     Mental Status: She is alert and oriented to person, place, and time.     Cranial Nerves: No cranial nerve deficit.     Deep Tendon Reflexes: Reflexes are normal and symmetric.  Psychiatric:        Behavior: Behavior normal.        Thought Content: Thought content normal.        Judgment: Judgment normal.     BP (!) 159/68   Pulse 94   Temp (!) 97 F (36.1 C) (Temporal)   Ht _0  (  1.651 m)   Wt 148 lb (67.1 kg)   BMI 24.63 kg/m        Assessment & Plan:   Jessica Torres comes in today with chief complaint of Back Pain (Right side pain patient wants MRI, cyst on right kidney /)   Diagnosis and orders addressed:  1. Acute right-sided low back pain without sciatica - CMP14+EGFR - DG Lumbar Spine 2-3 Views - Ambulatory referral to Physical Therapy - baclofen (LIORESAL) 10 MG tablet; Take 1 tablet (10 mg total) by mouth 3 (three) times daily.  Dispense: 30 each; Refill: 0 - MR Lumbar Spine W Wo Contrast; Future  2. Type 2 diabetes mellitus with other circulatory complication, with long-term current use of insulin (HCC) - Bayer DCA Hb A1c Waived   3. Stage 4 chronic kidney disease (HCC)  - Avoid NSAID's  -Will avoid steroids because  uncontrolled DM -Baclofen as needed- sedation precautions discussed  Referral to PT and MRI pending  -Follow up if symptoms worsen or not improve   [name], FNP

## 2021-11-28 NOTE — Patient Instructions (Signed)
Acute Back Pain, Adult Acute back pain is sudden and usually short-lived. It is often caused by an injury to the muscles and tissues in the back. The injury may result from: A muscle, tendon, or ligament getting overstretched or torn. Ligaments are tissues that connect bones to each other. Lifting something improperly can cause a back strain. Wear and tear (degeneration) of the spinal disks. Spinal disks are circular tissue that provide cushioning between the bones of the spine (vertebrae). Twisting motions, such as while playing sports or doing yard work. A hit to the back. Arthritis. You may have a physical exam, lab tests, and imaging tests to find the cause of your pain. Acute back pain usually goes away with rest and home care. Follow these instructions at home: Managing pain, stiffness, and swelling Take over-the-counter and prescription medicines only as told by your health care provider. Treatment may include medicines for pain and inflammation that are taken by mouth or applied to the skin, or muscle relaxants. Your health care provider may recommend applying ice during the first 24-48 hours after your pain starts. To do this: Put ice in a plastic bag. Place a towel between your skin and the bag. Leave the ice on for 20 minutes, 2-3 times a day. Remove the ice if your skin turns bright red. This is very important. If you cannot feel pain, heat, or cold, you have a greater risk of damage to the area. If directed, apply heat to the affected area as often as told by your health care provider. Use the heat source that your health care provider recommends, such as a moist heat pack or a heating pad. Place a towel between your skin and the heat source. Leave the heat on for 20-30 minutes. Remove the heat if your skin turns bright red. This is especially important if you are unable to feel pain, heat, or cold. You have a greater risk of getting burned. Activity  Do not stay in bed. Staying in  bed for more than 1-2 days can delay your recovery. Sit up and stand up straight. Avoid leaning forward when you sit or hunching over when you stand. If you work at a desk, sit close to it so you do not need to lean over. Keep your chin tucked in. Keep your neck drawn back, and keep your elbows bent at a 90-degree angle (right angle). Sit high and close to the steering wheel when you drive. Add lower back (lumbar) support to your car seat, if needed. Take short walks on even surfaces as soon as you are able. Try to increase the length of time you walk each day. Do not sit, drive, or stand in one place for more than 30 minutes at a time. Sitting or standing for long periods of time can put stress on your back. Do not drive or use heavy machinery while taking prescription pain medicine. Use proper lifting techniques. When you bend and lift, use positions that put less stress on your back: Bend your knees. Keep the load close to your body. Avoid twisting. Exercise regularly as told by your health care provider. Exercising helps your back heal faster and helps prevent back injuries by keeping muscles strong and flexible. Work with a physical therapist to make a safe exercise program, as recommended by your health care provider. Do any exercises as told by your physical therapist. Lifestyle Maintain a healthy weight. Extra weight puts stress on your back and makes it difficult to have good   posture. Avoid activities or situations that make you feel anxious or stressed. Stress and anxiety increase muscle tension and can make back pain worse. Learn ways to manage anxiety and stress, such as through exercise. General instructions Sleep on a firm mattress in a comfortable position. Try lying on your side with your knees slightly bent. If you lie on your back, put a pillow under your knees. Keep your head and neck in a straight line with your spine (neutral position) when using electronic equipment like  smartphones or pads. To do this: Raise your smartphone or pad to look at it instead of bending your head or neck to look down. Put the smartphone or pad at the level of your face while looking at the screen. Follow your treatment plan as told by your health care provider. This may include: Cognitive or behavioral therapy. Acupuncture or massage therapy. Meditation or yoga. Contact a health care provider if: You have pain that is not relieved with rest or medicine. You have increasing pain going down into your legs or buttocks. Your pain does not improve after 2 weeks. You have pain at night. You lose weight without trying. You have a fever or chills. You develop nausea or vomiting. You develop abdominal pain. Get help right away if: You develop new bowel or bladder control problems. You have unusual weakness or numbness in your arms or legs. You feel faint. These symptoms may represent a serious problem that is an emergency. Do not wait to see if the symptoms will go away. Get medical help right away. Call your local emergency services (911 in the [place] Do not drive yourself to the hospital. Summary Acute back pain is sudden and usually short-lived. Use proper lifting techniques. When you bend and lift, use positions that put less stress on your back. Take over-the-counter and prescription medicines only as told by your health care provider, and apply heat or ice as told. This information is not intended to replace advice given to you by your health care provider. Make sure you discuss any questions you have with your health care provider. Document Revised: 05/18/2020 Document Reviewed: 05/18/2020 Elsevier Patient Education  2023 [employer] Inc.  

## 2021-12-02 ENCOUNTER — Telehealth: Payer: Self-pay | Admitting: Family

## 2021-12-02 MED ORDER — ACCU-CHEK SOFTCLIX LANCETS MISC: 3 refills | Status: DC

## 2021-12-02 NOTE — Addendum Note (Signed)
Addended by: Antonietta Barcelona D on: 12/02/2021 02:13 PM   Modules accepted: Orders

## 2021-12-02 NOTE — Telephone Encounter (Signed)
No Voicemail box set up

## 2021-12-02 NOTE — Telephone Encounter (Signed)
MRI is scheduled.

## 2021-12-02 NOTE — Telephone Encounter (Signed)
Pt came in today re: the Freestyle lancets that I sent into [facility], they were not the right ones that go into her device. She came back in w/ one of the lancets and the device she is using. She has the Accu-Chek softclix device and lancets. Sent these in and [facility] the others.

## 2021-12-04 ENCOUNTER — Encounter: Payer: Self-pay | Admitting: Family Medicine

## 2021-12-23 ENCOUNTER — Ambulatory Visit (HOSPITAL_COMMUNITY): Payer: Medicare (Managed Care) | Attending: Family

## 2022-01-08 ENCOUNTER — Ambulatory Visit
Admission: RE | Admit: 2022-01-08 | Discharge: 2022-01-08 | Disposition: A | Discharge status: Home / Self Care | Payer: Medicare (Managed Care) | Source: Ambulatory Visit | Attending: Family | Admitting: Family

## 2022-01-08 DIAGNOSIS — Z1231 Encounter for screening mammogram for malignant neoplasm of breast: Secondary | ICD-10-CM | POA: Diagnosis not present

## 2022-01-09 ENCOUNTER — Telehealth: Payer: Self-pay | Admitting: Family

## 2022-01-09 NOTE — Telephone Encounter (Signed)
No answer unable to leave a message for patient to call back and schedule Medicare Annual Wellness Visit (AWV) to be completed by video or phone.   Last AWV: 09/25/2020   Please schedule at anytime with Shirley     45 minute appointment  Any questions, please contact me at [phone]

## 2022-01-24 ENCOUNTER — Other Ambulatory Visit: Payer: Self-pay | Admitting: Family

## 2022-01-31 ENCOUNTER — Other Ambulatory Visit: Payer: Self-pay | Admitting: Family

## 2022-01-31 DIAGNOSIS — Z0000 Encounter for general adult medical examination without abnormal findings: Secondary | ICD-10-CM

## 2022-02-21 ENCOUNTER — Ambulatory Visit (INDEPENDENT_AMBULATORY_CARE_PROVIDER_SITE_OTHER): Payer: Medicare (Managed Care) | Admitting: Family

## 2022-02-21 ENCOUNTER — Encounter: Payer: Self-pay | Admitting: Family

## 2022-02-21 VITALS — BP 110/66 | HR 102 | Temp 98.4°F | Ht 65.0 in | Wt 141.0 lb

## 2022-02-21 DIAGNOSIS — K219 Gastro-esophageal reflux disease without esophagitis: Secondary | ICD-10-CM

## 2022-02-21 DIAGNOSIS — I2510 Atherosclerotic heart disease of native coronary artery without angina pectoris: Secondary | ICD-10-CM | POA: Diagnosis not present

## 2022-02-21 DIAGNOSIS — I739 Peripheral vascular disease, unspecified: Secondary | ICD-10-CM | POA: Diagnosis not present

## 2022-02-21 DIAGNOSIS — J301 Allergic rhinitis due to pollen: Secondary | ICD-10-CM | POA: Diagnosis not present

## 2022-02-21 DIAGNOSIS — I2583 Coronary atherosclerosis due to lipid rich plaque: Secondary | ICD-10-CM

## 2022-02-21 DIAGNOSIS — G8929 Other chronic pain: Secondary | ICD-10-CM | POA: Diagnosis not present

## 2022-02-21 DIAGNOSIS — N184 Chronic kidney disease, stage 4 (severe): Secondary | ICD-10-CM

## 2022-02-21 DIAGNOSIS — E7800 Pure hypercholesterolemia, unspecified: Secondary | ICD-10-CM

## 2022-02-21 DIAGNOSIS — E1159 Type 2 diabetes mellitus with other circulatory complications: Secondary | ICD-10-CM

## 2022-02-21 DIAGNOSIS — M545 Low back pain, unspecified: Secondary | ICD-10-CM

## 2022-02-21 DIAGNOSIS — E039 Hypothyroidism, unspecified: Secondary | ICD-10-CM

## 2022-02-21 DIAGNOSIS — E611 Iron deficiency: Secondary | ICD-10-CM

## 2022-02-21 DIAGNOSIS — Z794 Long term (current) use of insulin: Secondary | ICD-10-CM

## 2022-02-21 DIAGNOSIS — I152 Hypertension secondary to endocrine disorders: Secondary | ICD-10-CM

## 2022-02-21 LAB — BAYER DCA HB A1C WAIVED: HB A1C (BAYER DCA - WAIVED): 8.3 % — ABNORMAL HIGH (ref 4.8–5.6)

## 2022-02-21 MED ORDER — CETIRIZINE HCL 10 MG PO TABS: 10.0000 mg | ORAL_TABLET | Freq: Every day | ORAL | 1 refills | Status: DC

## 2022-02-21 MED ORDER — FLUTICASONE PROPIONATE 50 MCG/ACT NA SUSP: 2.0000 | Freq: Every day | NASAL | 6 refills | Status: DC

## 2022-02-21 NOTE — Patient Instructions (Signed)
Health Maintenance After Age 79 After age 79, you are at a higher risk for certain long-term diseases and infections as well as injuries from falls. Falls are a major cause of broken bones and head injuries in people who are older than age 79. Getting regular preventive care can help to keep you healthy and well. Preventive care includes getting regular testing and making lifestyle changes as recommended by your health care provider. Talk with your health care provider about: Which screenings and tests you should have. A screening is a test that checks for a disease when you have no symptoms. A diet and exercise plan that is right for you. What should I know about screenings and tests to prevent falls? Screening and testing are the best ways to find a health problem early. Early diagnosis and treatment give you the best chance of managing medical conditions that are common after age 79. Certain conditions and lifestyle choices may make you more likely to have a fall. Your health care provider may recommend: Regular vision checks. Poor vision and conditions such as cataracts can make you more likely to have a fall. If you wear glasses, make sure to get your prescription updated if your vision changes. Medicine review. Work with your health care provider to regularly review all of the medicines you are taking, including over-the-counter medicines. Ask your health care provider about any side effects that may make you more likely to have a fall. Tell your health care provider if any medicines that you take make you feel dizzy or sleepy. Strength and balance checks. Your health care provider may recommend certain tests to check your strength and balance while standing, walking, or changing positions. Foot health exam. Foot pain and numbness, as well as not wearing proper footwear, can make you more likely to have a fall. Screenings, including: Osteoporosis screening. Osteoporosis is a condition that causes  the bones to get weaker and break more easily. Blood pressure screening. Blood pressure changes and medicines to control blood pressure can make you feel dizzy. Depression screening. You may be more likely to have a fall if you have a fear of falling, feel depressed, or feel unable to do activities that you used to do. Alcohol use screening. Using too much alcohol can affect your balance and may make you more likely to have a fall. Follow these instructions at home: Lifestyle Do not drink alcohol if: Your health care provider tells you not to drink. If you drink alcohol: Limit how much you have to: 0-1 drink a day for women. 0-2 drinks a day for men. Know how much alcohol is in your drink. In the [place] one drink equals one 12 oz bottle of beer (355 mL), one 5 oz glass of wine (148 mL), or one 1 oz glass of hard liquor (44 mL). Do not use any products that contain nicotine or tobacco. These products include cigarettes, chewing tobacco, and vaping devices, such as e-cigarettes. If you need help quitting, ask your health care provider. Activity  Follow a regular exercise program to stay fit. This will help you maintain your balance. Ask your health care provider what types of exercise are appropriate for you. If you need a cane or walker, use it as recommended by your health care provider. Wear supportive shoes that have nonskid soles. Safety  Remove any tripping hazards, such as rugs, cords, and clutter. Install safety equipment such as grab bars in bathrooms and safety rails on stairs. Keep rooms and walkways   well-lit. General instructions Talk with your health care provider about your risks for falling. Tell your health care provider if: You fall. Be sure to tell your health care provider about all falls, even ones that seem minor. You feel dizzy, tiredness (fatigue), or off-balance. Take over-the-counter and prescription medicines only as told by your health care provider. These include  supplements. Eat a healthy diet and maintain a healthy weight. A healthy diet includes low-fat dairy products, low-fat (lean) meats, and fiber from whole grains, beans, and lots of fruits and vegetables. Stay current with your vaccines. Schedule regular health, dental, and eye exams. Summary Having a healthy lifestyle and getting preventive care can help to protect your health and wellness after age 79. Screening and testing are the best way to find a health problem early and help you avoid having a fall. Early diagnosis and treatment give you the best chance for managing medical conditions that are more common for people who are older than age 79. Falls are a major cause of broken bones and head injuries in people who are older than age 79. Take precautions to prevent a fall at home. Work with your health care provider to learn what changes you can make to improve your health and wellness and to prevent falls. This information is not intended to replace advice given to you by your health care provider. Make sure you discuss any questions you have with your health care provider. Document Revised: 07/16/2020 Document Reviewed: 07/16/2020 Elsevier Patient Education  2023 Elsevier Inc.  

## 2022-02-21 NOTE — Progress Notes (Signed)
Subjective:    Patient ID: Jessica Torres, female    DOB: 09/30/1940, 81 y.o.   MRN: [id number]  Chief Complaint  Patient presents with   Medical Management of Chronic Issues   Pt presents to the office today for CPE and chronic follow up. She has seen Vascular for PAD, but states she was told she no longer needed to see them.   She is followed by nephrologists for CKD.    She had a colonoscopy on 11/26/20.  Diabetes She presents for her follow-up diabetic visit. She has type 2 diabetes mellitus. Pertinent negatives for hypoglycemia include no headaches or nervousness/anxiousness. Associated symptoms include fatigue and foot paresthesias. Pertinent negatives for diabetes include no blurred vision. Symptoms are stable. Diabetic complications include peripheral neuropathy. Risk factors for coronary artery disease include diabetes mellitus, hypertension, sedentary lifestyle, post-menopausal and dyslipidemia. She is following a generally healthy diet. Her overall blood glucose range is 90-110 mg/dl. Eye exam is not current.  Hypertension This is a chronic problem. The current episode started more than 1 year ago. The problem has been resolved since onset. The problem is controlled. Associated symptoms include malaise/fatigue. Pertinent negatives include no blurred vision, headaches, peripheral edema or shortness of breath. Risk factors for coronary artery disease include dyslipidemia, diabetes mellitus and sedentary lifestyle. The current treatment provides moderate improvement. Identifiable causes of hypertension include a thyroid problem.  Gastroesophageal Reflux She complains of belching, coughing and heartburn. She reports no sore throat. This is a chronic problem. The current episode started more than 1 year ago. The problem occurs occasionally. Associated symptoms include fatigue. She has tried a PPI for the symptoms. The treatment provided moderate relief.  Thyroid Problem Presents for  follow-up visit. Symptoms include constipation and fatigue. Patient reports no anxiety or diarrhea. The symptoms have been stable. Her past medical history is significant for hyperlipidemia.  Anemia Presents for follow-up visit. Symptoms include malaise/fatigue.  Back Pain This is a chronic problem. The current episode started more than 1 year ago. The problem occurs intermittently. The problem has been waxing and waning since onset. The pain is present in the lumbar spine. The quality of the pain is described as aching. The pain is at a severity of 8/10. The pain is moderate. Pertinent negatives include no headaches. The treatment provided mild relief.  Hyperlipidemia This is a chronic problem. The current episode started more than 1 year ago. Pertinent negatives include no shortness of breath. Current antihyperlipidemic treatment includes ezetimibe. The current treatment provides mild improvement of lipids.  Sinus Problem This is a new problem. The current episode started more than 1 month ago. The problem has been waxing and waning since onset. Associated symptoms include congestion, coughing and sinus pressure. Pertinent negatives include no headaches, shortness of breath, sneezing or sore throat. Past treatments include oral decongestants. The treatment provided mild relief.      Review of Systems  Constitutional:  Positive for fatigue and malaise/fatigue.  HENT:  Positive for congestion and sinus pressure. Negative for sneezing and sore throat.   Eyes:  Negative for blurred vision.  Respiratory:  Positive for cough. Negative for shortness of breath.   Gastrointestinal:  Positive for constipation and heartburn. Negative for diarrhea.  Musculoskeletal:  Positive for back pain.  Neurological:  Negative for headaches.  Psychiatric/Behavioral:  The patient is not nervous/anxious.   All other systems reviewed and are negative.      Objective:   Physical Exam Vitals reviewed.   Constitutional:  General: She is not in acute distress.    Appearance: She is well-developed.  HENT:     Head: Normocephalic and atraumatic.     Right Ear: Tympanic membrane normal.     Left Ear: Tympanic membrane normal.     Mouth/Throat:     Pharynx: Posterior oropharyngeal erythema present.  Eyes:     Pupils: Pupils are equal, round, and reactive to light.  Neck:     Thyroid: No thyromegaly.  Cardiovascular:     Rate and Rhythm: Normal rate and regular rhythm.     Heart sounds: Normal heart sounds. No murmur heard. Pulmonary:     Effort: Pulmonary effort is normal. No respiratory distress.     Breath sounds: Normal breath sounds. No wheezing.  Abdominal:     General: Bowel sounds are normal. There is no distension.     Palpations: Abdomen is soft.     Tenderness: There is no abdominal tenderness.  Musculoskeletal:        General: No tenderness. Normal range of motion.     Cervical back: Normal range of motion and neck supple.  Skin:    General: Skin is warm and dry.  Neurological:     Mental Status: She is alert and oriented to person, place, and time.     Cranial Nerves: No cranial nerve deficit.     Deep Tendon Reflexes: Reflexes are normal and symmetric.  Psychiatric:        Behavior: Behavior normal.        Thought Content: Thought content normal.        Judgment: Judgment normal.       BP 110/66   Pulse (!) 102   Temp 98.4 F (36.9 C)   Ht _0  (1.651 m)   Wt 141 lb (64 kg)   SpO2 97%   BMI 23.46 kg/m      Assessment & Plan:  Jessica Torres comes in today with chief complaint of Medical Management of Chronic Issues   Diagnosis and orders addressed:  1. Gastroesophageal reflux disease without esophagitis - CMP14+EGFR  2. Hypertension associated with diabetes ([place]) - CMP14+EGFR  3. Hypothyroidism, unspecified type - CMP14+EGFR  4. Iron deficiency - CMP14+EGFR  5. Stage 4 chronic kidney disease (HCC) - CMP14+EGFR  6. Pure  hypercholesterolemia  - CMP14+EGFR  7. Type 2 diabetes mellitus with other circulatory complication, with long-term current use of insulin (HCC)  - CMP14+EGFR - Bayer DCA Hb A1c Waived - Microalbumin / creatinine urine ratio  8. Chronic bilateral low back pain without sciatica - CMP14+EGFR  9. Claudication in peripheral vascular disease ([place]) - CMP14+EGFR  10. Coronary artery disease due to lipid rich plaque - CMP14+EGFR  11. Allergic rhinitis due to pollen, unspecified seasonality Start zyrtec and flonase - cetirizine (ZYRTEC ALLERGY) 10 MG tablet; Take 1 tablet (10 mg total) by mouth daily.  Dispense: 90 tablet; Refill: 1 - fluticasone (FLONASE) 50 MCG/ACT nasal spray; Place 2 sprays into both nostrils daily.  Dispense: 16 g; Refill: 6   Labs pending Health Maintenance reviewed Diet and exercise encouraged  Follow up plan: 6 months    [name], FNP

## 2022-02-22 LAB — CMP14+EGFR
ALT: 12 IU/L (ref 0–32)
AST: 45 IU/L — ABNORMAL HIGH (ref 0–40)
Albumin/Globulin Ratio: 1 — ABNORMAL LOW (ref 1.2–2.2)
Albumin: 3.4 g/dL — ABNORMAL LOW (ref 3.8–4.8)
Alkaline Phosphatase: 89 IU/L (ref 44–121)
BUN/Creatinine Ratio: 10 — ABNORMAL LOW (ref 12–28)
BUN: 17 mg/dL (ref 8–27)
Bilirubin Total: 0.9 mg/dL (ref 0.0–1.2)
CO2: 20 mmol/L (ref 20–29)
Calcium: 9.5 mg/dL (ref 8.7–10.3)
Chloride: 103 mmol/L (ref 96–106)
Creatinine, Ser: 1.62 mg/dL — ABNORMAL HIGH (ref 0.57–1.00)
Globulin, Total: 3.3 g/dL (ref 1.5–4.5)
Glucose: 349 mg/dL — ABNORMAL HIGH (ref 70–99)
Potassium: 3.9 mmol/L (ref 3.5–5.2)
Sodium: 138 mmol/L (ref 134–144)
Total Protein: 6.7 g/dL (ref 6.0–8.5)
eGFR: 32 mL/min/{1.73_m2} — ABNORMAL LOW (ref >59)

## 2022-02-23 LAB — MICROALBUMIN / CREATININE URINE RATIO
Creatinine, Urine: 97.2 mg/dL
Microalb/Creat Ratio: 10 mg/g creat (ref 0–29)
Microalbumin, Urine: 10.1 ug/mL

## 2022-02-24 ENCOUNTER — Other Ambulatory Visit: Payer: Self-pay | Admitting: Family

## 2022-02-24 MED ORDER — SEMAGLUTIDE(0.25 OR 0.5MG/DOS) 2 MG/3ML ~~LOC~~ SOPN: 0.5000 mg | PEN_INJECTOR | SUBCUTANEOUS | 1 refills | Status: DC

## 2022-03-11 ENCOUNTER — Telehealth: Payer: Self-pay | Admitting: Family

## 2022-03-11 NOTE — Telephone Encounter (Signed)
Sounds like this is appropriate for telephone visit or in person visit.  Please get her scheduled on DOD or with PCP.  Otherwise, defer to PCP return.

## 2022-03-13 ENCOUNTER — Telehealth (INDEPENDENT_AMBULATORY_CARE_PROVIDER_SITE_OTHER): Payer: Medicare (Managed Care) | Admitting: Family Medicine

## 2022-03-13 ENCOUNTER — Encounter: Payer: Self-pay | Admitting: Family Medicine

## 2022-03-13 DIAGNOSIS — J0140 Acute pansinusitis, unspecified: Secondary | ICD-10-CM | POA: Diagnosis not present

## 2022-03-13 MED ORDER — DOXYCYCLINE HYCLATE 100 MG PO TABS: 100.0000 mg | ORAL_TABLET | Freq: Two times a day (BID) | ORAL | 0 refills | Status: AC

## 2022-03-13 NOTE — Telephone Encounter (Signed)
Can't leave message

## 2022-03-13 NOTE — Telephone Encounter (Signed)
Please schedule her a video visit with me today.

## 2022-03-13 NOTE — Progress Notes (Signed)
Virtual Visit via telephone Note Due to COVID-19 pandemic this visit was conducted virtually. This visit type was conducted due to national recommendations for restrictions regarding the COVID-19 Pandemic (e.g. social distancing, sheltering in place) in an effort to limit this patient's exposure and mitigate transmission in our community. All issues noted in this document were discussed and addressed.  A physical exam was not performed with this format.   I connected with Jessica Torres on 03/13/2022 at 0900 by telephone and verified that I am speaking with the correct person using two identifiers. Jessica Torres is currently located at home and patient is currently with them during visit. The provider, Monia Pouch, FNP is located in their office at time of visit.  I discussed the limitations, risks, security and privacy concerns of performing an evaluation and management service by virtual visit and the availability of in person appointments. I also discussed with the patient that there may be a patient responsible charge related to this service. The patient expressed understanding and agreed to proceed.  Subjective:  Patient ID: Jessica Torres, female    DOB: [birth year], 82 y.o.   MRN: [id number]  Chief Complaint:  Sinus Problem   HPI: Jessica Torres is a 82 y.o. female presenting on 03/13/2022 for Sinus Problem   Ongoing frontal and maxillary sinus pressure for over 3 weeks. Has been using Flonase and antihistamine without relief of symptoms.   Sinus Problem This is a recurrent problem. Episode onset: 3 weeks ago. The problem has been gradually worsening since onset. There has been no fever. The pain is moderate. Associated symptoms include congestion, coughing, ear pain, headaches and sinus pressure. Pertinent negatives include no chills, diaphoresis, hoarse voice, neck pain, shortness of breath, sneezing, sore throat or swollen glands. Past treatments include oral decongestants and nasal  decongestants. The treatment provided no relief.     Relevant past medical, surgical, family, and social history reviewed and updated as indicated.  Allergies and medications reviewed and updated.   Past Medical History:  Diagnosis Date   Arthritis    "fingers probably" (10/30/2016)   Bulging lumbar disc    Chronic lower back pain    Difficulty urinating    GERD (gastroesophageal reflux disease)    Heart murmur    hx   Hyperlipidemia    statin intolerant   Hypertension    Hypothyroidism    PAD (peripheral artery disease) (HCC)    Peripheral arterial disease (HCC)    Shortness of breath dyspnea    Sleep apnea    "dx'd years ago; had severe problems; all of the sudden it just went away" (07/06/2014) *still gone" (10/30/2016)   Stroke [facility]) ~ 2010   denies residual on 10/30/2016   Type 2 diabetes mellitus ([place])    Urgency of urination    Uterine cancer [facility])     Past Surgical History:  Procedure Laterality Date   ANGIOPLASTY Right 01/22/2015   w/ atherectomy R sfa   BALLOON ANGIOPLASTY, ARTERY Right    SFA/notes 07/06/2014   BREAST EXCISIONAL BIOPSY Left    BREAST SURGERY     CARPAL TUNNEL RELEASE Right 1980's   COLONOSCOPY WITH PROPOFOL N/A 11/26/2020   Procedure: COLONOSCOPY WITH PROPOFOL;  Surgeon: Eloise Harman, DO;  [facility];  Service: Endoscopy;  Laterality: N/A;  9:00am, pt cannot come in earlier   [place]     "dr lanced it; in dr's  office"   INCISION AND DRAINAGE BREAST ABSCESS Left    LOWER EXTREMITY ANGIOGRAM N/A 07/06/2014   Procedure: LOWER EXTREMITY ANGIOGRAM & R-SFA atherectomy and balloon angioplasty;  Surgeon: Lorretta Harp, MD;  Location: [facility] CATH LAB;  Service: Cardiovascular;  Laterality: R   LOWER EXTREMITY ANGIOGRAM  10/30/2016   LOWER EXTREMITY ANGIOGRAPHY N/A 10/30/2016   Procedure: Lower Extremity Angiography;  Surgeon: Lorretta Harp, MD;  Location: [place] CV LAB;  Service:  Cardiovascular;  Laterality: N/A;   PERIPHERAL VASCULAR CATHETERIZATION N/A 08/17/2014   Procedure: Lower Extremity Angiography;  Surgeon: Lorretta Harp, MD;  Location: [place] CV LAB;  Service: Cardiovascular;  Laterality: N/A;   PERIPHERAL VASCULAR CATHETERIZATION Left 08/17/2014   Procedure: Peripheral Vascular Atherectomy and Viabahn stent L-SFA;  Surgeon: Lorretta Harp, MD; Laterality: Left; L-SFA   PERIPHERAL VASCULAR CATHETERIZATION N/A 01/11/2015   Procedure: Lower Extremity Angiography;  Surgeon: Lorretta Harp, MD;  Location: [place] CV LAB;  Service: Cardiovascular;  Laterality: N/A;   PERIPHERAL VASCULAR CATHETERIZATION Right 01/22/2015   Procedure: Peripheral Vascular Atherectomy;  Surgeon: Lorretta Harp, MD;  Location: [place] CV LAB;  Service: Cardiovascular;  Laterality: Right;   POLYPECTOMY  11/26/2020   Procedure: POLYPECTOMY;  Surgeon: Eloise Harman, DO;  [facility];  Service: Endoscopy;;   TUBAL LIGATION     VAGINAL HYSTERECTOMY      Social History   Socioeconomic History   Marital status: Divorced    Spouse name: Not on file   Number of children: 4   Years of education: Not on file   Highest education level: Not on file  Occupational History   Occupation: retired  Tobacco Use   Smoking status: Never   Smokeless tobacco: Never  Vaping Use   Vaping Use: Never used  Substance and Sexual Activity   Alcohol use: Yes    Alcohol/week: 0.0 standard drinks of alcohol    Comment: 10/30/2016  "margarita q  2 wks or so"   Drug use: No   Sexual activity: Never  Other Topics Concern   Not on file  Social History Narrative   2 sons and their sons are living with her right now   Another son lives next door and daughter lives in [place] Determinants of Health   Financial Resource Strain: [place]  (09/25/2020)   Overall Financial Resource Strain (CARDIA)    Difficulty of Paying Living Expenses: Not hard at all  Food  Insecurity: No [place] (09/25/2020)   Hunger Vital Sign    Worried About Running Out of Food in the Last Year: Never true    [place] in the Last Year: Never true  Transportation Needs: No Transportation Needs (09/25/2020)   PRAPARE - Hydrologist (Medical): No    Lack of Transportation (Non-Medical): No  Physical Activity: Inactive (09/25/2020)   Exercise Vital Sign    Days of Exercise per Week: 0 days    Minutes of Exercise per Session: 0 min  Stress: Stress Concern Present (09/25/2020)   [place]    Feeling of Stress : To some extent  Social Connections: Moderately Integrated (09/25/2020)   Social Connection and Isolation Panel [NHANES]    Frequency of Communication with Friends and Family: More than three times a week    Frequency of Social Gatherings with Friends and Family: More than three times a week  Attends Religious Services: More than 4 times per year    Active Member of Clubs or Organizations: Yes    Attends Archivist Meetings: More than 4 times per year    Marital Status: Divorced  Intimate Partner Violence: Not At Risk (09/25/2020)   Humiliation, Afraid, Rape, and Kick questionnaire    Fear of Current or Ex-Partner: No    Emotionally Abused: No    Physically Abused: No    Sexually Abused: No    Outpatient Encounter Medications as of 03/13/2022  Medication Sig   doxycycline (VIBRA-TABS) 100 MG tablet Take 1 tablet (100 mg total) by mouth 2 (two) times daily for 10 days. 1 po bid   Accu-Chek Softclix Lancets lancets Test BS TID Dx E11.9   Alcohol Swabs (B-D SINGLE USE SWABS REGULAR) PADS Test BS 4 times daily Dx E11.9   baclofen (LIORESAL) 10 MG tablet Take 1 tablet (10 mg total) by mouth 3 (three) times daily.   BD PEN NEEDLE NANO 2ND GEN 32G X 4 MM MISC Use twice daily with insulin Dx E11.9   cetirizine (ZYRTEC ALLERGY) 10 MG tablet Take 1 tablet (10  mg total) by mouth daily.   Cholecalciferol 25 MCG (1000 UT) capsule Take by mouth.   cilostazol (PLETAL) 50 MG tablet Take 1 tablet (50 mg total) by mouth 2 (two) times daily.   clopidogrel (PLAVIX) 75 MG tablet Take 1 tablet (75 mg total) by mouth daily.   empagliflozin (JARDIANCE) 25 MG TABS tablet Take 25 mg by mouth daily.   ezetimibe (ZETIA) 10 MG tablet Take 1 tablet (10 mg total) by mouth daily.   fenofibrate 160 MG tablet Take 1 tablet (160 mg total) by mouth daily.   ferrous sulfate 325 (65 FE) MG EC tablet Take 325 mg by mouth once a week.   fluticasone (FLONASE) 50 MCG/ACT nasal spray Place 2 sprays into both nostrils daily.   glucose blood (FREESTYLE LITE) test strip Test BS TID Dx E11.9   insulin glargine, 2 Unit Dial, (TOUJEO MAX SOLOSTAR) 300 UNIT/ML Solostar Pen INJECT 52 UNITS UNDER THE SKIN DAILY   insulin lispro (HUMALOG) 100 UNIT/ML KwikPen Inject 10-20 Units into the skin 3 (three) times daily.   levothyroxine (SYNTHROID) 88 MCG tablet TAKE 1 TABLET DAILY BEFORE BREAKFAST   losartan-hydrochlorothiazide (HYZAAR) 50-12.5 MG tablet TAKE 1 TABLET DAILY   pantoprazole (PROTONIX) 40 MG tablet TAKE 1 TABLET DAILY   Semaglutide,0.25 or 0.'5MG'$ /DOS, 2 MG/3ML SOPN Inject 0.5 mg into the skin once a week.   [DISCONTINUED] metFORMIN (GLUCOPHAGE) 1000 MG tablet Take 1 tablet (1,000 mg total) by mouth 2 (two) times daily with a meal. (Patient taking differently: Take 1,000 mg by mouth 2 (two) times daily with a meal. Only taking once a day)   No facility-administered encounter medications on file as of 03/13/2022.    Allergies  Allergen Reactions   Morphine And Related Swelling and Other (See Comments)    Facial swelling   Latex Rash   Statins Other (See Comments)    Myalgias     Review of Systems  Constitutional:  Positive for activity change. Negative for appetite change, chills, diaphoresis, fatigue, fever and unexpected weight change.  HENT:  Positive for congestion, ear  pain, postnasal drip, rhinorrhea, sinus pressure and sinus pain. Negative for dental problem, drooling, ear discharge, facial swelling, hearing loss, hoarse voice, mouth sores, nosebleeds, sneezing, sore throat, tinnitus, trouble swallowing and voice change.   Eyes:  Negative for photophobia and visual disturbance.  Respiratory:  Positive for cough. Negative for apnea, choking, chest tightness, shortness of breath, wheezing and stridor.   Cardiovascular:  Negative for chest pain, palpitations and leg swelling.  Gastrointestinal:  Negative for abdominal pain.  Genitourinary:  Negative for decreased urine volume and difficulty urinating.  Musculoskeletal:  Negative for arthralgias, myalgias and neck pain.  Neurological:  Positive for headaches. Negative for dizziness, tremors, seizures, syncope, facial asymmetry, speech difficulty, weakness, light-headedness and numbness.  Psychiatric/Behavioral:  Negative for confusion.   All other systems reviewed and are negative.        Observations/Objective: No vital signs or physical exam, this was a virtual health encounter.  Pt alert and oriented, answers all questions appropriately, and able to speak in full sentences.    Assessment and Plan: Jessica Torres was seen today for sinus problem.  Diagnoses and all orders for this visit:  Acute non-recurrent pansinusitis Ongoing sinus pressure and pain for 3 weeks. Has tried and failed conservative treatment at home. Will add doxycycline to regimen. Pt aware to continue Flonase and Mucinex. Report new, worsening, or persistent symptoms.  -     doxycycline (VIBRA-TABS) 100 MG tablet; Take 1 tablet (100 mg total) by mouth 2 (two) times daily for 10 days. 1 po bid     Follow Up Instructions: Return if symptoms worsen or fail to improve.    I discussed the assessment and treatment plan with the patient. The patient was provided an opportunity to ask questions and all were answered. The patient agreed with  the plan and demonstrated an understanding of the instructions.   The patient was advised to call back or seek an in-person evaluation if the symptoms worsen or if the condition fails to improve as anticipated.  The above assessment and management plan was discussed with the patient. The patient verbalized understanding of and has agreed to the management plan. Patient is aware to call the clinic if they develop any new symptoms or if symptoms persist or worsen. Patient is aware when to return to the clinic for a follow-up visit. Patient educated on when it is appropriate to go to the emergency department.    I provided 15 minutes of time during this telephone encounter.   Monia Pouch, FNP-C [facility] [address] [place], [place] [address] [phone] 03/13/2022

## 2022-03-13 NOTE — Telephone Encounter (Signed)
Patient had a virtual visit with Monia Pouch today and is being treated for sinus infection

## 2022-03-14 MED ORDER — SEMAGLUTIDE(0.25 OR 0.5MG/DOS) 2 MG/3ML ~~LOC~~ SOPN: 0.2500 mg | PEN_INJECTOR | SUBCUTANEOUS | 1 refills | Status: DC

## 2022-03-14 NOTE — Addendum Note (Signed)
Addended by: [name] A on: 03/14/2022 01:10 PM   Modules accepted: Orders

## 2022-03-18 ENCOUNTER — Other Ambulatory Visit: Payer: Self-pay | Admitting: Family

## 2022-03-18 DIAGNOSIS — Z794 Long term (current) use of insulin: Secondary | ICD-10-CM

## 2022-03-18 DIAGNOSIS — Z0000 Encounter for general adult medical examination without abnormal findings: Secondary | ICD-10-CM

## 2022-04-02 ENCOUNTER — Other Ambulatory Visit: Payer: Self-pay | Admitting: Family

## 2022-04-02 DIAGNOSIS — Z0000 Encounter for general adult medical examination without abnormal findings: Secondary | ICD-10-CM

## 2022-04-02 DIAGNOSIS — E7800 Pure hypercholesterolemia, unspecified: Secondary | ICD-10-CM

## 2022-04-24 ENCOUNTER — Other Ambulatory Visit: Payer: Self-pay | Admitting: Family

## 2022-04-27 ENCOUNTER — Encounter (HOSPITAL_BASED_OUTPATIENT_CLINIC_OR_DEPARTMENT_OTHER): Payer: Self-pay | Admitting: Emergency Medicine

## 2022-04-27 ENCOUNTER — Inpatient Hospital Stay (HOSPITAL_BASED_OUTPATIENT_CLINIC_OR_DEPARTMENT_OTHER)
Admission: EM | Admit: 2022-04-27 | Discharge: 2022-04-30 | DRG: 377 | Disposition: A | Discharge status: Home / Self Care | Payer: Medicare HMO | Attending: Family Medicine | Admitting: Family Medicine

## 2022-04-27 ENCOUNTER — Emergency Department (HOSPITAL_BASED_OUTPATIENT_CLINIC_OR_DEPARTMENT_OTHER): Payer: Medicare HMO

## 2022-04-27 ENCOUNTER — Other Ambulatory Visit: Payer: Self-pay | Admitting: Gastroenterology

## 2022-04-27 ENCOUNTER — Other Ambulatory Visit: Payer: Self-pay

## 2022-04-27 DIAGNOSIS — D649 Anemia, unspecified: Principal | ICD-10-CM

## 2022-04-27 DIAGNOSIS — Z79899 Other long term (current) drug therapy: Secondary | ICD-10-CM

## 2022-04-27 DIAGNOSIS — I21A1 Myocardial infarction type 2: Secondary | ICD-10-CM | POA: Diagnosis present

## 2022-04-27 DIAGNOSIS — E1169 Type 2 diabetes mellitus with other specified complication: Secondary | ICD-10-CM

## 2022-04-27 DIAGNOSIS — Z885 Allergy status to narcotic agent status: Secondary | ICD-10-CM

## 2022-04-27 DIAGNOSIS — K31811 Angiodysplasia of stomach and duodenum with bleeding: Principal | ICD-10-CM | POA: Diagnosis present

## 2022-04-27 DIAGNOSIS — I5033 Acute on chronic diastolic (congestive) heart failure: Secondary | ICD-10-CM | POA: Diagnosis not present

## 2022-04-27 DIAGNOSIS — G8929 Other chronic pain: Secondary | ICD-10-CM | POA: Diagnosis present

## 2022-04-27 DIAGNOSIS — D509 Iron deficiency anemia, unspecified: Secondary | ICD-10-CM | POA: Diagnosis present

## 2022-04-27 DIAGNOSIS — R059 Cough, unspecified: Secondary | ICD-10-CM | POA: Diagnosis present

## 2022-04-27 DIAGNOSIS — Z7902 Long term (current) use of antithrombotics/antiplatelets: Secondary | ICD-10-CM

## 2022-04-27 DIAGNOSIS — Z86010 Personal history of colonic polyps: Secondary | ICD-10-CM | POA: Diagnosis not present

## 2022-04-27 DIAGNOSIS — I130 Hypertensive heart and chronic kidney disease with heart failure and stage 1 through stage 4 chronic kidney disease, or unspecified chronic kidney disease: Secondary | ICD-10-CM | POA: Diagnosis present

## 2022-04-27 DIAGNOSIS — R54 Age-related physical debility: Secondary | ICD-10-CM | POA: Diagnosis present

## 2022-04-27 DIAGNOSIS — E1151 Type 2 diabetes mellitus with diabetic peripheral angiopathy without gangrene: Secondary | ICD-10-CM | POA: Diagnosis present

## 2022-04-27 DIAGNOSIS — Z8249 Family history of ischemic heart disease and other diseases of the circulatory system: Secondary | ICD-10-CM | POA: Diagnosis not present

## 2022-04-27 DIAGNOSIS — Z90710 Acquired absence of both cervix and uterus: Secondary | ICD-10-CM

## 2022-04-27 DIAGNOSIS — I252 Old myocardial infarction: Secondary | ICD-10-CM | POA: Diagnosis not present

## 2022-04-27 DIAGNOSIS — K921 Melena: Secondary | ICD-10-CM | POA: Diagnosis not present

## 2022-04-27 DIAGNOSIS — I214 Non-ST elevation (NSTEMI) myocardial infarction: Secondary | ICD-10-CM | POA: Diagnosis not present

## 2022-04-27 DIAGNOSIS — E1159 Type 2 diabetes mellitus with other circulatory complications: Secondary | ICD-10-CM

## 2022-04-27 DIAGNOSIS — Z79890 Hormone replacement therapy (postmenopausal): Secondary | ICD-10-CM

## 2022-04-27 DIAGNOSIS — I739 Peripheral vascular disease, unspecified: Secondary | ICD-10-CM | POA: Diagnosis not present

## 2022-04-27 DIAGNOSIS — R011 Cardiac murmur, unspecified: Secondary | ICD-10-CM | POA: Diagnosis present

## 2022-04-27 DIAGNOSIS — Z8542 Personal history of malignant neoplasm of other parts of uterus: Secondary | ICD-10-CM

## 2022-04-27 DIAGNOSIS — E039 Hypothyroidism, unspecified: Secondary | ICD-10-CM | POA: Diagnosis present

## 2022-04-27 DIAGNOSIS — K922 Gastrointestinal hemorrhage, unspecified: Secondary | ICD-10-CM | POA: Diagnosis not present

## 2022-04-27 DIAGNOSIS — N1832 Chronic kidney disease, stage 3b: Secondary | ICD-10-CM

## 2022-04-27 DIAGNOSIS — R195 Other fecal abnormalities: Secondary | ICD-10-CM

## 2022-04-27 DIAGNOSIS — G4730 Sleep apnea, unspecified: Secondary | ICD-10-CM | POA: Diagnosis present

## 2022-04-27 DIAGNOSIS — I5031 Acute diastolic (congestive) heart failure: Secondary | ICD-10-CM | POA: Diagnosis present

## 2022-04-27 DIAGNOSIS — K317 Polyp of stomach and duodenum: Secondary | ICD-10-CM | POA: Diagnosis present

## 2022-04-27 DIAGNOSIS — R053 Chronic cough: Secondary | ICD-10-CM | POA: Diagnosis present

## 2022-04-27 DIAGNOSIS — K2970 Gastritis, unspecified, without bleeding: Secondary | ICD-10-CM | POA: Diagnosis present

## 2022-04-27 DIAGNOSIS — D62 Acute posthemorrhagic anemia: Secondary | ICD-10-CM

## 2022-04-27 DIAGNOSIS — K2990 Gastroduodenitis, unspecified, without bleeding: Secondary | ICD-10-CM | POA: Diagnosis present

## 2022-04-27 DIAGNOSIS — K219 Gastro-esophageal reflux disease without esophagitis: Secondary | ICD-10-CM | POA: Diagnosis present

## 2022-04-27 DIAGNOSIS — Z8673 Personal history of transient ischemic attack (TIA), and cerebral infarction without residual deficits: Secondary | ICD-10-CM

## 2022-04-27 DIAGNOSIS — M19042 Primary osteoarthritis, left hand: Secondary | ICD-10-CM | POA: Diagnosis present

## 2022-04-27 DIAGNOSIS — I152 Hypertension secondary to endocrine disorders: Secondary | ICD-10-CM

## 2022-04-27 DIAGNOSIS — N183 Chronic kidney disease, stage 3 unspecified: Secondary | ICD-10-CM | POA: Diagnosis present

## 2022-04-27 DIAGNOSIS — K3189 Other diseases of stomach and duodenum: Secondary | ICD-10-CM | POA: Diagnosis not present

## 2022-04-27 DIAGNOSIS — R7989 Other specified abnormal findings of blood chemistry: Secondary | ICD-10-CM

## 2022-04-27 DIAGNOSIS — E785 Hyperlipidemia, unspecified: Secondary | ICD-10-CM | POA: Diagnosis present

## 2022-04-27 DIAGNOSIS — M19041 Primary osteoarthritis, right hand: Secondary | ICD-10-CM | POA: Diagnosis present

## 2022-04-27 DIAGNOSIS — Z833 Family history of diabetes mellitus: Secondary | ICD-10-CM | POA: Diagnosis not present

## 2022-04-27 DIAGNOSIS — E1122 Type 2 diabetes mellitus with diabetic chronic kidney disease: Secondary | ICD-10-CM | POA: Diagnosis present

## 2022-04-27 DIAGNOSIS — I5030 Unspecified diastolic (congestive) heart failure: Secondary | ICD-10-CM | POA: Diagnosis present

## 2022-04-27 DIAGNOSIS — D631 Anemia in chronic kidney disease: Secondary | ICD-10-CM | POA: Diagnosis present

## 2022-04-27 DIAGNOSIS — Z7984 Long term (current) use of oral hypoglycemic drugs: Secondary | ICD-10-CM

## 2022-04-27 DIAGNOSIS — Z91040 Latex allergy status: Secondary | ICD-10-CM

## 2022-04-27 DIAGNOSIS — K31819 Angiodysplasia of stomach and duodenum without bleeding: Secondary | ICD-10-CM

## 2022-04-27 DIAGNOSIS — E7800 Pure hypercholesterolemia, unspecified: Secondary | ICD-10-CM

## 2022-04-27 DIAGNOSIS — Z9851 Tubal ligation status: Secondary | ICD-10-CM

## 2022-04-27 DIAGNOSIS — E119 Type 2 diabetes mellitus without complications: Secondary | ICD-10-CM

## 2022-04-27 DIAGNOSIS — I10 Essential (primary) hypertension: Secondary | ICD-10-CM | POA: Diagnosis not present

## 2022-04-27 DIAGNOSIS — Z888 Allergy status to other drugs, medicaments and biological substances status: Secondary | ICD-10-CM

## 2022-04-27 DIAGNOSIS — Z794 Long term (current) use of insulin: Secondary | ICD-10-CM

## 2022-04-27 DIAGNOSIS — I2510 Atherosclerotic heart disease of native coronary artery without angina pectoris: Secondary | ICD-10-CM | POA: Diagnosis not present

## 2022-04-27 DIAGNOSIS — R079 Chest pain, unspecified: Secondary | ICD-10-CM | POA: Diagnosis present

## 2022-04-27 LAB — BASIC METABOLIC PANEL
Anion gap: 10 (ref 5–15)
BUN: 20 mg/dL (ref 8–23)
CO2: 19 mmol/L — ABNORMAL LOW (ref 22–32)
Calcium: 9.3 mg/dL (ref 8.9–10.3)
Chloride: 105 mmol/L (ref 98–111)
Creatinine, Ser: 1.37 mg/dL — ABNORMAL HIGH (ref 0.44–1.00)
GFR, Estimated: 39 mL/min — ABNORMAL LOW (ref >60)
Glucose, Bld: 341 mg/dL — ABNORMAL HIGH (ref 70–99)
Potassium: 4.5 mmol/L (ref 3.5–5.1)
Sodium: 134 mmol/L — ABNORMAL LOW (ref 135–145)

## 2022-04-27 LAB — CBC
HCT: 20.5 % — ABNORMAL LOW (ref 36.0–46.0)
Hemoglobin: 6.8 g/dL — CL (ref 12.0–15.0)
MCH: 29.3 pg (ref 26.0–34.0)
MCHC: 33.2 g/dL (ref 30.0–36.0)
MCV: 88.4 fL (ref 80.0–100.0)
Platelets: 259 10*3/uL (ref 150–400)
RBC: 2.32 MIL/uL — ABNORMAL LOW (ref 3.87–5.11)
RDW: 15.2 % (ref 11.5–15.5)
WBC: 6.4 10*3/uL (ref 4.0–10.5)
nRBC: 0 % (ref 0.0–0.2)

## 2022-04-27 LAB — CBG MONITORING, ED: Glucose-Capillary: 243 mg/dL — ABNORMAL HIGH (ref 70–99)

## 2022-04-27 LAB — PROTIME-INR
INR: 1.3 — ABNORMAL HIGH (ref 0.8–1.2)
Prothrombin Time: 15.9 seconds — ABNORMAL HIGH (ref 11.4–15.2)

## 2022-04-27 LAB — OCCULT BLOOD X 1 CARD TO LAB, STOOL: Fecal Occult Bld: POSITIVE — AB

## 2022-04-27 LAB — PROCALCITONIN: Procalcitonin: <0.1 ng/mL

## 2022-04-27 LAB — HEMOGLOBIN A1C
Hgb A1c MFr Bld: 6.6 % — ABNORMAL HIGH (ref 4.8–5.6)
Mean Plasma Glucose: 142.72 mg/dL

## 2022-04-27 LAB — TROPONIN I (HIGH SENSITIVITY)
Troponin I (High Sensitivity): 357 ng/L (ref <18)
Troponin I (High Sensitivity): 457 ng/L (ref <18)

## 2022-04-27 LAB — ABO/RH: ABO/RH(D): O NEG

## 2022-04-27 LAB — BRAIN NATRIURETIC PEPTIDE: B Natriuretic Peptide: 499.5 pg/mL — ABNORMAL HIGH (ref 0.0–100.0)

## 2022-04-27 LAB — TSH: TSH: 3.412 u[IU]/mL (ref 0.350–4.500)

## 2022-04-27 MED ORDER — PANTOPRAZOLE SODIUM 40 MG IV SOLR: 40.0000 mg | Freq: Two times a day (BID) | INTRAVENOUS | Status: DC

## 2022-04-27 MED ORDER — INSULIN ASPART 100 UNIT/ML IJ SOLN
0.0000 [IU] | Freq: Four times a day (QID) | INTRAMUSCULAR | Status: DC
  Administered 2022-04-27: 5 [IU] via SUBCUTANEOUS
  Administered 2022-04-27: 3 [IU] via SUBCUTANEOUS

## 2022-04-27 MED ORDER — SODIUM CHLORIDE 0.9% IV SOLUTION: Freq: Once | INTRAVENOUS | Status: DC

## 2022-04-27 MED ORDER — FUROSEMIDE 10 MG/ML IJ SOLN
40.0000 mg | Freq: Once | INTRAMUSCULAR | Status: AC
  Administered 2022-04-27: 40 mg via INTRAVENOUS
  Filled 2022-04-27: qty 4

## 2022-04-27 MED ORDER — PANTOPRAZOLE SODIUM 40 MG IV SOLR
40.0000 mg | Freq: Once | INTRAVENOUS | Status: AC
  Filled 2022-04-27: qty 10

## 2022-04-27 MED ORDER — ACETAMINOPHEN 325 MG PO TABS
650.0000 mg | ORAL_TABLET | Freq: Four times a day (QID) | ORAL | Status: DC | PRN
  Filled 2022-04-27: qty 2

## 2022-04-27 MED ORDER — ALBUTEROL SULFATE (2.5 MG/3ML) 0.083% IN NEBU: 2.5000 mg | INHALATION_SOLUTION | Freq: Four times a day (QID) | RESPIRATORY_TRACT | Status: DC | PRN

## 2022-04-27 MED ORDER — LEVOTHYROXINE SODIUM 88 MCG PO TABS
88.0000 ug | ORAL_TABLET | Freq: Every day | ORAL | Status: DC
  Administered 2022-04-28 – 2022-04-29 (×2): 88 ug via ORAL
  Filled 2022-04-27 (×3): qty 1

## 2022-04-27 MED ORDER — SODIUM CHLORIDE 0.9 % IV SOLN: INTRAVENOUS | Status: DC

## 2022-04-27 MED ORDER — ONDANSETRON HCL 4 MG PO TABS: 4.0000 mg | ORAL_TABLET | Freq: Four times a day (QID) | ORAL | Status: DC | PRN

## 2022-04-27 MED ORDER — PANTOPRAZOLE INFUSION (NEW) - SIMPLE MED
8.0000 mg/h | INTRAVENOUS | Status: DC
  Administered 2022-04-27 – 2022-04-28 (×2): 8 mg/h via INTRAVENOUS
  Filled 2022-04-27 (×5): qty 100

## 2022-04-27 MED ORDER — EZETIMIBE 10 MG PO TABS
10.0000 mg | ORAL_TABLET | Freq: Every day | ORAL | Status: DC
  Administered 2022-04-27 – 2022-04-29 (×3): 10 mg via ORAL
  Filled 2022-04-27 (×3): qty 1

## 2022-04-27 MED ORDER — PANTOPRAZOLE 80MG IVPB - SIMPLE MED
80.0000 mg | Freq: Once | INTRAVENOUS | Status: DC
  Filled 2022-04-27: qty 100

## 2022-04-27 MED ORDER — PANTOPRAZOLE SODIUM 40 MG IV SOLR
INTRAVENOUS | Status: AC
  Administered 2022-04-27: 40 mg via INTRAVENOUS
  Filled 2022-04-27: qty 20

## 2022-04-27 MED ORDER — FENOFIBRATE 160 MG PO TABS
160.0000 mg | ORAL_TABLET | Freq: Every day | ORAL | Status: DC
  Administered 2022-04-27 – 2022-04-29 (×3): 160 mg via ORAL
  Filled 2022-04-27 (×4): qty 1

## 2022-04-27 MED ORDER — SODIUM CHLORIDE 0.9% FLUSH
3.0000 mL | Freq: Two times a day (BID) | INTRAVENOUS | Status: DC
  Administered 2022-04-27 – 2022-04-30 (×6): 3 mL via INTRAVENOUS

## 2022-04-27 MED ORDER — GUAIFENESIN ER 600 MG PO TB12
600.0000 mg | ORAL_TABLET | Freq: Two times a day (BID) | ORAL | Status: DC
  Administered 2022-04-27 – 2022-04-29 (×6): 600 mg via ORAL
  Filled 2022-04-27 (×6): qty 1

## 2022-04-27 MED ORDER — ACETAMINOPHEN 650 MG RE SUPP: 650.0000 mg | Freq: Four times a day (QID) | RECTAL | Status: DC | PRN

## 2022-04-27 MED ORDER — ONDANSETRON HCL 4 MG/2ML IJ SOLN: 4.0000 mg | Freq: Four times a day (QID) | INTRAMUSCULAR | Status: DC | PRN

## 2022-04-27 NOTE — ED Notes (Signed)
RN provided pt slip resistant socks and placed pt black socks on back of stretcher

## 2022-04-27 NOTE — ED Notes (Signed)
Dr. Stark Jock aware of hgb of 6.8.

## 2022-04-27 NOTE — ED Provider Notes (Signed)
Patient sent over from [facility] for acute anemia in the setting of likely upper GI bleed, to be admitted to hospitalist and get transfusion and admitted.  Patient is awake and alert but appears pale.  She reports she is pain-free at this time.  Hemoglobin is less than 7.  Patient is agreeable to receive blood transfusion. 2 units of packed red blood cells have been ordered.  I have also messaged the in-house [facility] hospitalist about need for admission.   Ripley Fraise, MD 04/27/22 [phone]

## 2022-04-27 NOTE — ED Triage Notes (Signed)
Chest pain mid sternal started 4 days ago Abd pain "for weeks" and some dark stools

## 2022-04-27 NOTE — H&P (Addendum)
History and Physical    [name] [id number] DOB: 1940/06/08 DOA: 04/27/2022 DOS: the patient was seen and examined on 04/27/2022 PCP: Sharion Balloon, FNP  Patient coming from: Home  Chief Complaint:  Chief Complaint  Patient presents with   Chest Pain   Abdominal Pain   HPI: Jessica Torres is a 82 y.o. 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  She reports having heaviness and discomfort in between her breast and underneath her arms that causes her to have shortness of breath.  Patient reports that she has had dark stools along with burning midline abdominal discomfort.  She is on Plavix and denies taking any NSAIDs.  She had significant nausea with vomiting 4 days ago that she thought was possibly related to something that she had ate because she likes spicy foods.  During the episodes of nausea and vomiting patient also reported having a nosebleed.  For which she can recall emesis did not have gross blood present.  Associated symptoms include persistent cough since December, postnasal drip, and leg swelling more recent.  For the chest pain she has been placing a heating pad under her chest as well as her stomach which is seem to help sometimes with symptoms.  Due to the chest pain symptoms she has started taking 325 mg of aspirin over the last week in addition to her Plavix, but she is not normally on aspirin.  She reports taking 2 full dose aspirin this morning prior to coming to the hospital.   In the emergency department patient was noted to be afebrile with soft blood pressures 90-minute as low as 107/45.  Labs significant for hemoglobin 6.8 (previous baseline around 12), sodium 134, CO2 19, BUN 20, creatinine 1.37, glucose 341, anion gap 10, and high-sensitivity  troponins 357-> 457.  EKG noted ST depressions in the lateral leads.  Chest x-ray noted interstitial prominence bilaterally with concern for possible edema or infiltrate and small bilateral pleural effusions stool guaiacs were positive.  PT/INR was pending.  Orders were placed to type and screen and transfuse 2 units of packed red blood cells.  She had been given Protonix 40 mg IV and placed on a Protonix drip.  Case had been discussed with Dr. Candis Schatz of GI as well as cardiology, but no heparin at this time due to GI bleed.  Review of Systems: As mentioned in the history of present illness. All other systems reviewed and are negative. Past Medical History:  Diagnosis Date   Arthritis    "fingers probably" (10/30/2016)   Bulging lumbar disc    Chronic lower back pain    Difficulty urinating    GERD (gastroesophageal reflux disease)    Heart murmur    hx   Hyperlipidemia    statin intolerant   Hypertension    Hypothyroidism    PAD (peripheral artery disease) (HCC)    Peripheral arterial disease (HCC)    Shortness of breath dyspnea    Sleep apnea    "dx'd years ago; had severe problems; all of the sudden it just went away" (07/06/2014) *still gone" (10/30/2016)   Stroke [facility]) ~ 2010   denies residual on 10/30/2016   Type 2 diabetes mellitus ([place])    Urgency of urination    Uterine cancer [facility])    Past Surgical History:  Procedure  Laterality Date   ANGIOPLASTY Right 01/22/2015   w/ atherectomy R sfa   BALLOON ANGIOPLASTY, ARTERY Right    SFA/notes 07/06/2014   BREAST EXCISIONAL BIOPSY Left    BREAST SURGERY     CARPAL TUNNEL RELEASE Right 1980's   COLONOSCOPY WITH PROPOFOL N/A 11/26/2020   Procedure: COLONOSCOPY WITH PROPOFOL;  Surgeon: Eloise Harman, DO;  [facility];  Service: Endoscopy;  Laterality: N/A;  9:00am, pt cannot come in earlier   [place]     "dr lanced it; in dr's office"   INCISION AND DRAINAGE BREAST  ABSCESS Left    LOWER EXTREMITY ANGIOGRAM N/A 07/06/2014   Procedure: LOWER EXTREMITY ANGIOGRAM & R-SFA atherectomy and balloon angioplasty;  Surgeon: Lorretta Harp, MD;  Location: [facility] CATH LAB;  Service: Cardiovascular;  Laterality: R   LOWER EXTREMITY ANGIOGRAM  10/30/2016   LOWER EXTREMITY ANGIOGRAPHY N/A 10/30/2016   Procedure: Lower Extremity Angiography;  Surgeon: Lorretta Harp, MD;  Location: [place] CV LAB;  Service: Cardiovascular;  Laterality: N/A;   PERIPHERAL VASCULAR CATHETERIZATION N/A 08/17/2014   Procedure: Lower Extremity Angiography;  Surgeon: Lorretta Harp, MD;  Location: [place] CV LAB;  Service: Cardiovascular;  Laterality: N/A;   PERIPHERAL VASCULAR CATHETERIZATION Left 08/17/2014   Procedure: Peripheral Vascular Atherectomy and Viabahn stent L-SFA;  Surgeon: Lorretta Harp, MD; Laterality: Left; L-SFA   PERIPHERAL VASCULAR CATHETERIZATION N/A 01/11/2015   Procedure: Lower Extremity Angiography;  Surgeon: Lorretta Harp, MD;  Location: [place] CV LAB;  Service: Cardiovascular;  Laterality: N/A;   PERIPHERAL VASCULAR CATHETERIZATION Right 01/22/2015   Procedure: Peripheral Vascular Atherectomy;  Surgeon: Lorretta Harp, MD;  Location: [place] CV LAB;  Service: Cardiovascular;  Laterality: Right;   POLYPECTOMY  11/26/2020   Procedure: POLYPECTOMY;  Surgeon: Eloise Harman, DO;  Location: [facility];  Service: Endoscopy;;   TUBAL LIGATION     VAGINAL HYSTERECTOMY     Social History:  reports that she has never smoked. She has never used smokeless tobacco. She reports current alcohol use. She reports that she does not use drugs.  Allergies  Allergen Reactions   Morphine And Related Swelling and Other (See Comments)    Facial swelling   Latex Rash   Statins Other (See Comments)    Myalgias     Family History  Problem Relation Age of Onset   Heart attack Mother    Heart disease Mother    Arrhythmia Mother        has PPM   Heart disease  Father    Diabetes Sister    Breast cancer Neg Hx     Prior to Admission medications   Medication Sig Start Date End Date Taking? Authorizing Provider  Accu-Chek Softclix Lancets lancets Test BS TID Dx E11.9 12/02/21   [name] A, FNP  Alcohol Swabs (B-D SINGLE USE SWABS REGULAR) PADS Test BS 4 times daily Dx E11.9 04/02/21   Sharion Balloon, FNP  baclofen (LIORESAL) 10 MG tablet Take 1 tablet (10 mg total) by mouth 3 (three) times daily. 11/28/21   Sharion Balloon, FNP  BD PEN NEEDLE NANO 2ND GEN 32G X 4 MM MISC Use twice daily with insulin Dx E11.9 11/25/21   Sharion Balloon, FNP  cetirizine (ZYRTEC ALLERGY) 10 MG tablet Take 1 tablet (10 mg total) by mouth daily. 02/21/22   Sharion Balloon, FNP  Cholecalciferol 25 MCG (1000 UT)  capsule Take by mouth. 06/08/21 06/08/22  [provider]  cilostazol (PLETAL) 50 MG tablet Take 1 tablet (50 mg total) by mouth 2 (two) times daily. 07/25/20   Lorretta Harp, MD  clopidogrel (PLAVIX) 75 MG tablet TAKE 1 TABLET DAILY 04/02/22   [name] A, FNP  empagliflozin (JARDIANCE) 25 MG TABS tablet Take 25 mg by mouth daily.    [provider]  ezetimibe (ZETIA) 10 MG tablet Take 1 tablet (10 mg total) by mouth daily. 08/22/21   Sharion Balloon, FNP  fenofibrate 160 MG tablet TAKE 1 TABLET DAILY 04/02/22   [name] A, FNP  ferrous sulfate 325 (65 FE) MG EC tablet Take 325 mg by mouth once a week. 07/19/20 07/19/21  [provider]  fluticasone (FLONASE) 50 MCG/ACT nasal spray Place 2 sprays into both nostrils daily. 02/21/22   [name] A, FNP  glucose blood (FREESTYLE LITE) test strip Test BS TID Dx E11.9 11/25/21   [name] A, FNP  insulin glargine, 2 Unit Dial, (TOUJEO MAX SOLOSTAR) 300 UNIT/ML Solostar Pen INJECT 52 UNITS UNDER THE SKIN DAILY 03/18/22   [name] A, FNP  insulin lispro (HUMALOG) 100 UNIT/ML KwikPen Inject 10-20 Units into the skin 3 (three) times daily. 08/22/21   Sharion Balloon, FNP   levothyroxine (SYNTHROID) 88 MCG tablet TAKE 1 TABLET DAILY BEFORE BREAKFAST 04/24/22   [name] A, FNP  losartan-hydrochlorothiazide [facility]) 50-12.5 MG tablet TAKE 1 TABLET DAILY 04/24/22   [name] A, FNP  pantoprazole (PROTONIX) 40 MG tablet TAKE 1 TABLET DAILY 02/03/22   [name] A, FNP  Semaglutide,0.25 or 0.5MG/DOS, 2 MG/3ML SOPN Inject 0.25 mg into the skin once a week. 03/14/22   Sharion Balloon, FNP  metFORMIN (GLUCOPHAGE) 1000 MG tablet Take 1 tablet (1,000 mg total) by mouth 2 (two) times daily with a meal. Patient taking differently: Take 1,000 mg by mouth 2 (two) times daily with a meal. Only taking once a day 04/06/19 07/13/19  Terald Sleeper, PA-C    Physical Exam: Vitals:   04/27/22 0500 04/27/22 0515 04/27/22 0530 04/27/22 0644  BP: (!) 116/53 (!) 110/47  (!) 117/51  Pulse: 90 91  91  Resp: 14 16  16  $ Temp:   98.7 F (37.1 C) 98.6 F (37 C)  TempSrc:   Oral Oral  SpO2: 96% 94%  97%  Weight:      Height:        Constitutional: Elderly female who appears to be in no acute distress at this time Eyes: PERRL, lids and conjunctivae normal ENMT: Mucous membranes are moist.   Neck: normal, supple.  Appears to have some JVD. Respiratory: Lung sounds are fairly clear with no significant wheezing or rhonchi appreciated at this time. Cardiovascular: Regular rate and rhythm, no murmurs / rubs / gallops.  +1 pitting edema noted at the ankles.   Abdomen: no tenderness, no masses palpated.   Bowel sounds positive.  Musculoskeletal: no clubbing / cyanosis. No joint deformity upper and lower extremities. Good ROM, no contractures. Normal muscle tone.  Skin: Pallor present. Neurologic: CN 2-12 grossly intact.   Strength 5/5 in all 4.  Psychiatric: Normal judgment and insight. Alert and oriented x 3. Normal mood.   Data Reviewed:  EKG noted sinus tachycardia at 105 bpm with ST depressions in the lateral leads.  Assessment and Plan: Acute blood loss anemia secondary  to GI bleed Patient reported having dark stools over the last 3 weeks.  Found to have hemoglobin down to 6.8 when baseline previously been around 12.  Stool guaiacs were positive.  Patient also reported having nosebleeds.  She is on Plavix gastroenterology had been consulted.  She has been typed and screened and ordered 2 units of packed red blood cells as well as started on Protonix. -Admit to a progressive bed -Hold Plavix  -Continue Protonix -Continue with transfusion -Serial monitoring of H&H -Appreciate GI consultative services,  will follow-up for any further recommendations  NSTEMI Patient presented with complaints of chest pain.  High-sensitivity troponins 357-> 457.  EKG significant for ST depressions in the lateral leads.  Thought possibly secondary to demand in setting of acute blood loss Case had been discussed with cardiology, but due to GI bleed heparin was noted to be contraindicated at this time. -Check echocardiogram  Heart failure with preserved ejection fraction Acute.  Chest x-ray noted concern for bilateral interstitial prominence concerning for possible edema or infiltrate and small bilateral pleural effusions.  Last EF noted to be 60 to 65% with normal diastolic parameters back in 2016. -Strict I&O's and daily weights -Check BNP -Follow-up echocardiogram -Give Lasix 40 mg IV after first unit of PRBCs transfused.    Controlled diabetes mellitus type 2, with long-term use of insulin On admission glucose elevated up to 341 without elevated anion gap.  Last hemoglobin A1c was 8.3 on 02/21/2022. -Hypoglycemic protocols -Check hemoglobin A1c(6.6) -CBGs every 6 hours for now with moderate SSI -Plan to adjust regimen similar to home regimen when medically appropriate  Cough Patient reports having a persistent cough unclear if secondary to possible CHF or pna. -Follow-up BNP and procalcitonin  Hypertension associated with diabetes Blood pressures at noted been soft at  107/45-135/58.  Home blood pressure regimen includes losartan-hydrochlorothiazide 50-12.5 mg daily. -Hold losartan-hydrochlorothiazide.  Continue to monitor and determine when medically appropriate to resume  Chronic kidney disease stage IIIb Creatinine 1.37 which appears around patient's baseline. -Continue to monitor  History of CVA PAD Patient reports prior history of stroke that affected her left arm, but reported complete recovery area with no residual deficits.  She is status post bilateral SFA. -Held Plavix  Hyperlipidemia Patient is intolerant of statins. -Continue Zetia and fenofibrate  Hypothyroidism -Check TSH -Continue levothyroxine  DVT prophylaxis: SCDs Advance Care Planning:   Code Status: Full Code   Consults: Gastroenterology  Family Communication: Patient's Sister Peter Congo updated over the phone  Severity of Illness: The appropriate patient status for this patient is INPATIENT. Inpatient status is judged to be reasonable and necessary in order to provide the required intensity of service to ensure the patient's safety. The patient's presenting symptoms, physical exam findings, and initial radiographic and laboratory data in the context of their chronic comorbidities is felt to place them at high risk for further clinical deterioration. Furthermore, it is not anticipated that the patient will be medically stable for discharge from the hospital within 2 midnights of admission.   * I certify that at the point of admission it is my clinical judgment that the patient will require inpatient hospital care spanning beyond 2 midnights from the point of admission due to high intensity of service, high risk for further deterioration and high frequency of surveillance required.*  Author: Norval Morton, MD 04/27/2022 7:07 AM  For on call review [email or web address].

## 2022-04-27 NOTE — ED Notes (Signed)
ED TO INPATIENT HANDOFF REPORT  ED Nurse Name and Phone #: Leafy Ro, [phone]  S Name/Age/Gender Jessica Torres 82 y.o. female Room/Bed: 038C/038C  Code Status   Code Status: Full Code  Home/SNF/Other Home Patient oriented to: self, place, time, and situation Is this baseline? Yes   Triage Complete: Triage complete  Chief Complaint Chest pain [R07.9] GI bleed [K92.2]  Triage Note Chest pain mid sternal started 4 days ago Abd pain "for weeks" and some dark stools  Bib carelink form [facility], trop 357 and 457. Hgb 6.8 will need blood transfusion.   Allergies Allergies  Allergen Reactions   Morphine And Related Swelling and Other (See Comments)    Facial swelling   Latex Rash   Statins Other (See Comments)    Myalgias     Level of Care/Admitting Diagnosis ED Disposition     ED Disposition  Admit   Condition  --   Comment  Hospital Area: [place] [[id number]]  Level of Care: Progressive [102]  Admit to Progressive based on following criteria: GI, ENDOCRINE disease patients with GI bleeding, acute liver failure or pancreatitis, stable with diabetic ketoacidosis or thyrotoxicosis (hypothyroid) state.  Admit to Progressive based on following criteria: CARDIOVASCULAR & THORACIC of moderate stability with acute coronary syndrome symptoms/low risk myocardial infarction/hypertensive urgency/arrhythmias/heart failure potentially compromising stability and stable post cardiovascular intervention patients.  May admit patient to [facility] or [facility] if equivalent level of care is available:: No  Covid Evaluation: Asymptomatic - no recent exposure (last 10 days) testing not required  Diagnosis: GI bleed [id number]  Admitting Physician: Norval Morton [id number]  Attending Physician: Norval Morton [id number]  Certification:: I certify this patient will need inpatient services for at least 2 midnights          B Medical/Surgery History Past Medical  History:  Diagnosis Date   Arthritis    "fingers probably" (10/30/2016)   Bulging lumbar disc    Chronic lower back pain    Difficulty urinating    GERD (gastroesophageal reflux disease)    Heart murmur    hx   Hyperlipidemia    statin intolerant   Hypertension    Hypothyroidism    PAD (peripheral artery disease) ([place])    Peripheral arterial disease ([place])    Shortness of breath dyspnea    Sleep apnea    "dx'd years ago; had severe problems; all of the sudden it just went away" (07/06/2014) *still gone" (10/30/2016)   Stroke [facility]) ~ 2010   denies residual on 10/30/2016   Type 2 diabetes mellitus ([place])    Urgency of urination    Uterine cancer [facility])    Past Surgical History:  Procedure Laterality Date   ANGIOPLASTY Right 01/22/2015   w/ atherectomy R sfa   BALLOON ANGIOPLASTY, ARTERY Right    SFA/notes 07/06/2014   BREAST EXCISIONAL BIOPSY Left    BREAST SURGERY     CARPAL TUNNEL RELEASE Right 1980's   COLONOSCOPY WITH PROPOFOL N/A 11/26/2020   Procedure: COLONOSCOPY WITH PROPOFOL;  Surgeon: Eloise Harman, DO;  [facility];  Service: Endoscopy;  Laterality: N/A;  9:00am, pt cannot come in earlier   [place]     "dr lanced it; in dr's office"   INCISION AND DRAINAGE BREAST ABSCESS Left    LOWER EXTREMITY ANGIOGRAM N/A 07/06/2014   Procedure: LOWER EXTREMITY ANGIOGRAM & R-SFA atherectomy and balloon angioplasty;  Surgeon: Pearletha Forge  Gwenlyn Found, MD;  Location: [facility] CATH LAB;  Service: Cardiovascular;  Laterality: R   LOWER EXTREMITY ANGIOGRAM  10/30/2016   LOWER EXTREMITY ANGIOGRAPHY N/A 10/30/2016   Procedure: Lower Extremity Angiography;  Surgeon: Lorretta Harp, MD;  Location: [place] CV LAB;  Service: Cardiovascular;  Laterality: N/A;   PERIPHERAL VASCULAR CATHETERIZATION N/A 08/17/2014   Procedure: Lower Extremity Angiography;  Surgeon: Lorretta Harp, MD;  Location: [place] CV LAB;  Service: Cardiovascular;   Laterality: N/A;   PERIPHERAL VASCULAR CATHETERIZATION Left 08/17/2014   Procedure: Peripheral Vascular Atherectomy and Viabahn stent L-SFA;  Surgeon: Lorretta Harp, MD; Laterality: Left; L-SFA   PERIPHERAL VASCULAR CATHETERIZATION N/A 01/11/2015   Procedure: Lower Extremity Angiography;  Surgeon: Lorretta Harp, MD;  Location: [place] CV LAB;  Service: Cardiovascular;  Laterality: N/A;   PERIPHERAL VASCULAR CATHETERIZATION Right 01/22/2015   Procedure: Peripheral Vascular Atherectomy;  Surgeon: Lorretta Harp, MD;  Location: [place] CV LAB;  Service: Cardiovascular;  Laterality: Right;   POLYPECTOMY  11/26/2020   Procedure: POLYPECTOMY;  Surgeon: Eloise Harman, DO;  [facility];  Service: Endoscopy;;   TUBAL LIGATION     VAGINAL HYSTERECTOMY       A IV Location/Drains/Wounds Patient Lines/Drains/Airways Status     Active Line/Drains/Airways     Name Placement date Placement time Site Days   Peripheral IV 04/27/22 20 G Left Antecubital 04/27/22  0145  Antecubital  less than 1   Peripheral IV 04/27/22 20 G Anterior;Right Forearm 04/27/22  0318  Forearm  less than 1            Intake/Output Last 24 hours No intake or output data in the 24 hours ending 04/27/22 1319  Labs/Imaging Results for orders placed or performed during the hospital encounter of 04/27/22 (from the past 48 hour(s))  Basic metabolic panel     Status: Abnormal   Collection Time: 04/27/22  1:55 AM  Result Value Ref Range   Sodium 134 (L) 135 - 145 mmol/L   Potassium 4.5 3.5 - 5.1 mmol/L   Chloride 105 98 - 111 mmol/L   CO2 19 (L) 22 - 32 mmol/L   Glucose, Bld 341 (H) 70 - 99 mg/dL    Comment: Glucose reference range applies only to samples taken after fasting for at least 8 hours.   BUN 20 8 - 23 mg/dL   Creatinine, Ser 1.37 (H) 0.44 - 1.00 mg/dL   Calcium 9.3 8.9 - 10.3 mg/dL   GFR, Estimated 39 (L) >60 mL/min    Comment: (NOTE) Calculated using the CKD-EPI Creatinine Equation  (2021)    Anion gap 10 5 - 15    Comment: Performed at [facility], [place], [place] [address]  CBC     Status: Abnormal   Collection Time: 04/27/22  1:55 AM  Result Value Ref Range   WBC 6.4 4.0 - 10.5 K/uL   RBC 2.32 (L) 3.87 - 5.11 MIL/uL   Hemoglobin 6.8 (LL) 12.0 - 15.0 g/dL    Comment: REPEATED TO VERIFY THIS CRITICAL RESULT HAS VERIFIED AND BEEN CALLED TO C MAYNARD RN BY Carnella Guadalajara ON 02 18 2024 AT 0228, AND HAS BEEN READ BACK. 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    HCT 20.5 (L) 36.0 - 46.0 %   MCV 88.4 80.0 - 100.0 fL   MCH 29.3 26.0 - 34.0 pg   MCHC 33.2 30.0 - 36.0 g/dL  RDW 15.2 11.5 - 15.5 %   Platelets 259 150 - 400 K/uL   nRBC 0.0 0.0 - 0.2 %    Comment: Performed at [facility], [address] [place], [place] [address]  Troponin I (High Sensitivity)     Status: Abnormal   Collection Time: 04/27/22  1:55 AM  Result Value Ref Range   Troponin I (High Sensitivity) 357 (HH) <18 ng/L    Comment: CRITICAL RESULT CALLED TO, READ BACK BY AND VERIFIED WITH: C MAYNARD RN, 04/27/2022 @ 0251 BY Francene Finders (NOTE) Elevated high sensitivity troponin I (hsTnI) values and significant  changes across serial measurements may suggest ACS but many other  chronic and acute conditions are known to elevate hsTnI results.  Refer to the Links section for chest pain algorithms and additional  guidance. Performed at [facility], [address] [place], [place] [address]   Occult blood card to lab, stool Provider will collect     Status: Abnormal   Collection Time: 04/27/22  2:41 AM  Result Value Ref Range   Fecal Occult Bld POSITIVE (A) NEGATIVE    Comment: Performed at [facility], [place], [place] [address]  Troponin I (High Sensitivity)     Status: Abnormal   Collection Time: 04/27/22  3:55 AM  Result Value Ref Range   Troponin I (High  Sensitivity) 457 (HH) <18 ng/L    Comment: REPEATED TO VERIFY CRITICAL VALUE NOTED.  VALUE IS CONSISTENT WITH PREVIOUSLY REPORTED AND CALLED VALUE. (NOTE) Elevated high sensitivity troponin I (hsTnI) values and significant  changes across serial measurements may suggest ACS but many other  chronic and acute conditions are known to elevate hsTnI results.  Refer to the Links section for chest pain algorithms and additional  guidance. Performed at [facility], [address], [place], [place] [address]   Prepare RBC (crossmatch)     Status: None   Collection Time: 04/27/22  6:53 AM  Result Value Ref Range   Order Confirmation      ORDERS RECEIVED TO CROSSMATCH Performed at [facility] [address] [place], [place] [address]   CBG monitoring, ED     Status: Abnormal   Collection Time: 04/27/22  7:57 AM  Result Value Ref Range   Glucose-Capillary 243 (H) 70 - 99 mg/dL    Comment: Glucose reference range applies only to samples taken after fasting for at least 8 hours.   Comment 1 Notify RN    Comment 2 Document in Chart   Type and screen [place]     Status: None (Preliminary result)   Collection Time: 04/27/22  8:00 AM  Result Value Ref Range   ABO/RH(D) O NEG    Antibody Screen NEG    Sample Expiration 04/30/2022,2359    Unit Number [id number]    Blood Component Type RED CELLS,LR    Unit division 00    Status of Unit ISSUED    Transfusion Status OK TO TRANSFUSE    Crossmatch Result      Compatible Performed at [facility], [place] [address], [place],  [address]    Unit Number [id number]    Blood Component Type RED CELLS,LR    Unit division 00    Status of Unit ALLOCATED    Transfusion Status OK TO TRANSFUSE    Crossmatch Result Compatible   Protime-INR     Status: Abnormal   Collection Time: 04/27/22  8:00 AM  Result Value Ref Range   Prothrombin Time 15.9 (H) 11.4 - 15.2 seconds   INR 1.3 (H) 0.8 -  1.2    Comment: (NOTE) INR goal varies based on device and disease states. Performed at [facility], [place] [address] [place], [place] [address]   Brain natriuretic peptide     Status: Abnormal   Collection Time: 04/27/22  8:00 AM  Result Value Ref Range   B Natriuretic Peptide 499.5 (H) 0.0 - 100.0 pg/mL    Comment: Performed at [facility] [address], [place], [place] [address]  TSH     Status: None   Collection Time: 04/27/22  8:00 AM  Result Value Ref Range   TSH 3.412 0.350 - 4.500 uIU/mL    Comment: Performed by a 3rd Generation assay with a functional sensitivity of <=0.01 uIU/mL. Performed at [facility], [place] [address], [place], [place] [address]   Hemoglobin A1c     Status: Abnormal   Collection Time: 04/27/22  8:00 AM  Result Value Ref Range   Hgb A1c MFr Bld 6.6 (H) 4.8 - 5.6 %    Comment: (NOTE) Pre diabetes:          5.7%-6.4%  Diabetes:              >6.4%  Glycemic control for   <7.0% adults with diabetes    Mean Plasma Glucose 142.72 mg/dL    Comment: Performed at [facility] [address] [place], [place] [address]  Procalcitonin - Baseline     Status: None   Collection Time: 04/27/22  8:00 AM  Result Value Ref Range   Procalcitonin <0.10 ng/mL    Comment:        Interpretation: PCT (Procalcitonin) <= 0.5 ng/mL: Systemic infection (sepsis) is not likely. Local bacterial infection is possible. (NOTE)       Sepsis PCT Algorithm           Lower Respiratory Tract                                      Infection PCT Algorithm    ----------------------------     ----------------------------         PCT < 0.25 ng/mL                PCT < 0.10 ng/mL          Strongly encourage             Strongly discourage   discontinuation of antibiotics    initiation of antibiotics    ----------------------------     -----------------------------       PCT 0.25 - 0.50 ng/mL            PCT 0.10 - 0.25 ng/mL               OR       >80%  decrease in PCT            Discourage initiation of                                            antibiotics      Encourage discontinuation           of antibiotics    ----------------------------     -----------------------------  PCT >= 0.50 ng/mL              PCT 0.26 - 0.50 ng/mL               AND        <80% decrease in PCT             Encourage initiation of                                             antibiotics       Encourage continuation           of antibiotics    ----------------------------     -----------------------------        PCT >= 0.50 ng/mL                  PCT > 0.50 ng/mL               AND         increase in PCT                  Strongly encourage                                      initiation of antibiotics    Strongly encourage escalation           of antibiotics                                     -----------------------------                                           PCT <= 0.25 ng/mL                                                 OR                                        > 80% decrease in PCT                                      Discontinue / Do not initiate                                             antibiotics  Performed at [facility], [address], [place], [place] [address]   ABO/Rh     Status: None   Collection Time: 04/27/22 11:25 AM  Result Value Ref Range   ABO/RH(D)      O NEG Performed at [facility] [address] [place], [place] [address]    DG Chest [place] 1  View  Result Date: 04/27/2022 CLINICAL DATA:  Chest pain. EXAM: PORTABLE CHEST 1 VIEW COMPARISON:  10/20/2016. FINDINGS: The heart size and mediastinal contours are within normal limits. There is atherosclerotic calcification of the aorta. Interstitial prominence is noted bilaterally. There are small bilateral pleural effusions. No pneumothorax. No acute osseous abnormality. IMPRESSION: 1. Interstitial prominence bilaterally, possible edema or infiltrate.  2. Small bilateral pleural effusions. Electronically Signed   By: Brett Fairy M.D.   On: 04/27/2022 02:20    Pending Labs Unresulted Labs (From admission, onward)     Start     Ordered   04/28/22 0500  CBC  Tomorrow morning,   R        04/27/22 0730   04/28/22 [id number]  Basic metabolic panel  Tomorrow morning,   R        04/27/22 0730   04/27/22 1400  Hemoglobin and hematocrit, blood  Now then every 6 hours,   R (with TIMED occurrences)      04/27/22 0727            Vitals/Pain Today's Vitals   04/27/22 1244 04/27/22 1245 04/27/22 1300 04/27/22 1300  BP:  (!) 112/45 (!) 120/50   Pulse:  78 75   Resp:  15 13   Temp: 98.5 F (36.9 C)  98.4 F (36.9 C) 98.4 F (36.9 C)  TempSrc: Oral   Oral  SpO2:  99% 98%   Weight:      Height:      PainSc:        Isolation Precautions No active isolations  Medications Medications  pantoprozole (PROTONIX) 80 mg /NS 100 mL infusion (8 mg/hr Intravenous New Bag/Given 04/27/22 0415)  0.9 %  sodium chloride infusion (Manually program via Guardrails IV Fluids) ( Intravenous Not Given 04/27/22 0837)  sodium chloride flush (NS) 0.9 % injection 3 mL (3 mLs Intravenous Given 04/27/22 0933)  acetaminophen (TYLENOL) tablet 650 mg (has no administration in time range)    Or  acetaminophen (TYLENOL) suppository 650 mg (has no administration in time range)  ondansetron (ZOFRAN) tablet 4 mg (has no administration in time range)    Or  ondansetron (ZOFRAN) injection 4 mg (has no administration in time range)  albuterol (PROVENTIL) (2.5 MG/3ML) 0.083% nebulizer solution 2.5 mg (has no administration in time range)  insulin aspart (novoLOG) injection 0-15 Units (5 Units Subcutaneous Given 04/27/22 0932)  guaiFENesin (MUCINEX) 12 hr tablet 600 mg (600 mg Oral Given 04/27/22 0933)  ezetimibe (ZETIA) tablet 10 mg (10 mg Oral Given 04/27/22 1114)  fenofibrate tablet 160 mg (160 mg Oral Given 04/27/22 1114)  levothyroxine (SYNTHROID) tablet 88 mcg (has no  administration in time range)  furosemide (LASIX) injection 40 mg (has no administration in time range)  pantoprazole (PROTONIX) injection 40 mg (40 mg Intravenous Given 04/27/22 0411)    Mobility Patient has been using a wheel chair to go down the hall to the restroom, minimal assistance to get into the chair     Focused Assessments Cardiac Assessment Handoff:  Cardiac Rhythm: Sinus tachycardia Lab Results  Component Value Date   CKTOTAL 81 01/19/2015   No results found for: "DDIMER" Does the Patient currently have chest pain? No    R Recommendations: See Admitting Provider Note  Report given to:   Additional Notes: on her first unit of blood, AOX4, continent, is hungry, waiting on GI to she can have a diet order

## 2022-04-27 NOTE — H&P (View-Only) (Signed)
Consultation  Referring Provider:     Dr. Fuller Plan Primary Care Physician:  Sharion Balloon, FNP Primary Gastroenterologist:     Dr. Hurshel Keys    Reason for Consultation:     Symptomatic anemia         HPI:   Jessica Torres is a 82 y.o. female with a history of peripheral artery disease, hypertension, diabetes and history of stroke who presented to the emergency department at [facility] earlier this morning with chest pain, abdominal discomfort and several weeks of dark stools.  The patient states that for several weeks now, she has been dealing with the persistent discomfort in her upper abdomen, chest and left side.  She finally decided to seek sick attention when the symptoms just would not go away.  She has had nausea and vomiting at least 1 occasion after eating some salsa at a [employer].  She reports that her stools for the last several weeks have been mostly black in color, but not necessarily tarry.  She has been taking Bayer aspirin, 325 mg frequently during the past couple weeks because of this pain and takes Plavix daily (either for peripheral vascular disease or history of stroke, not sure).  She denies taking any other NSAIDs.  She denies any prior history of any GI bleeding, upper or lower.  She thinks that she did have an upper endoscopy many years ago, but does not recall why.  She thinks it was normal.  She does have chronic GERD symptoms, but this is controlled with once daily Protonix, which she says she has been taking.  In the ED at [facility], her blood pressures were in the 90s and low 100s.  She was found to have a hemoglobin of 6.8, which is down from her baseline of 12.  BUN was not significantly elevated at 20.  She had mild troponin anemia.  Her case was reportedly discussed by cardiology, who felt that her troponin anemia was secondary to demand ischemia in the setting of severe anemia, and did not recommend anticoagulation.  [facility] this morning.  She has been on Protonix drip and received 2 units of packed red blood cells.  Repeat CBC pending.  Her troponins are downtrending.  She states that she feels much better today than she did yesterday.  She has not had any problems with the pain or discomfort in many hours.   She had a colonoscopy in September 2022 by Dr. Abbey Chatters for iron deficiency anemia. Colonoscopy was notable for 4 small polyps in the transverse colon, left-sided diverticulosis and internal hemorrhoids. No known history of previous upper endoscopy.   Past Medical History:  Diagnosis Date   Arthritis    "fingers probably" (10/30/2016)   Bulging lumbar disc    Chronic lower back pain    Difficulty urinating    GERD (gastroesophageal reflux disease)    Heart murmur    hx   Hyperlipidemia    statin intolerant   Hypertension    Hypothyroidism    PAD (peripheral artery disease) (HCC)    Peripheral arterial disease (HCC)    Shortness of breath dyspnea    Sleep apnea    "dx'd years ago; had severe problems; all of the sudden it just went away" (07/06/2014) *still gone" (10/30/2016)   Stroke [facility]) ~ 2010   denies residual on 10/30/2016   Type 2 diabetes mellitus ([place])    Urgency of urination    Uterine cancer (  [facility])     Past Surgical History:  Procedure Laterality Date   ANGIOPLASTY Right 01/22/2015   w/ atherectomy R sfa   BALLOON ANGIOPLASTY, ARTERY Right    SFA/notes 07/06/2014   BREAST EXCISIONAL BIOPSY Left    BREAST SURGERY     CARPAL TUNNEL RELEASE Right 1980's   COLONOSCOPY WITH PROPOFOL N/A 11/26/2020   Procedure: COLONOSCOPY WITH PROPOFOL;  Surgeon: Eloise Harman, DO;  [facility];  Service: Endoscopy;  Laterality: N/A;  9:00am, pt cannot come in earlier   [place]     "dr lanced it; in dr's office"   INCISION AND DRAINAGE BREAST ABSCESS Left    LOWER EXTREMITY ANGIOGRAM N/A 07/06/2014    Procedure: LOWER EXTREMITY ANGIOGRAM & R-SFA atherectomy and balloon angioplasty;  Surgeon: Lorretta Harp, MD;  Location: [facility] CATH LAB;  Service: Cardiovascular;  Laterality: R   LOWER EXTREMITY ANGIOGRAM  10/30/2016   LOWER EXTREMITY ANGIOGRAPHY N/A 10/30/2016   Procedure: Lower Extremity Angiography;  Surgeon: Lorretta Harp, MD;  Location: [place] CV LAB;  Service: Cardiovascular;  Laterality: N/A;   PERIPHERAL VASCULAR CATHETERIZATION N/A 08/17/2014   Procedure: Lower Extremity Angiography;  Surgeon: Lorretta Harp, MD;  Location: [place] CV LAB;  Service: Cardiovascular;  Laterality: N/A;   PERIPHERAL VASCULAR CATHETERIZATION Left 08/17/2014   Procedure: Peripheral Vascular Atherectomy and Viabahn stent L-SFA;  Surgeon: Lorretta Harp, MD; Laterality: Left; L-SFA   PERIPHERAL VASCULAR CATHETERIZATION N/A 01/11/2015   Procedure: Lower Extremity Angiography;  Surgeon: Lorretta Harp, MD;  Location: [place] CV LAB;  Service: Cardiovascular;  Laterality: N/A;   PERIPHERAL VASCULAR CATHETERIZATION Right 01/22/2015   Procedure: Peripheral Vascular Atherectomy;  Surgeon: Lorretta Harp, MD;  Location: [place] CV LAB;  Service: Cardiovascular;  Laterality: Right;   POLYPECTOMY  11/26/2020   Procedure: POLYPECTOMY;  Surgeon: Eloise Harman, DO;  [facility];  Service: Endoscopy;;   TUBAL LIGATION     VAGINAL HYSTERECTOMY      Family History  Problem Relation Age of Onset   Heart attack Mother    Heart disease Mother    Arrhythmia Mother        has PPM   Heart disease Father    Diabetes Sister    Breast cancer Neg Hx      Social History   Tobacco Use   Smoking status: Never   Smokeless tobacco: Never  Vaping Use   Vaping Use: Never used  Substance Use Topics   Alcohol use: Yes    Alcohol/week: 0.0 standard drinks of alcohol    Comment: 10/30/2016  "margarita q  2 wks or so"   Drug use: No    Prior to Admission medications   Medication Sig  Start Date End Date Taking? Authorizing Provider  clopidogrel (PLAVIX) 75 MG tablet TAKE 1 TABLET DAILY 04/02/22  Yes Hawks, Christy A, FNP  empagliflozin (JARDIANCE) 25 MG TABS tablet Take 25 mg by mouth as needed (low blood sugar).   Yes [provider]  ezetimibe (ZETIA) 10 MG tablet Take 1 tablet (10 mg total) by mouth daily. 08/22/21  Yes Hawks, Alyse Low A, FNP  fenofibrate 160 MG tablet TAKE 1 TABLET DAILY 04/02/22  Yes Hawks, Christy A, FNP  insulin glargine, 2 Unit Dial, (TOUJEO MAX SOLOSTAR) 300 UNIT/ML Solostar Pen INJECT 52 UNITS UNDER THE SKIN DAILY Patient taking differently: Inject 52 Units into the skin at bedtime. 03/18/22  Yes Hawks, Christy A, FNP  insulin lispro (HUMALOG) 100 UNIT/ML KwikPen Inject 10-20 Units into the skin 3 (three) times daily. Patient taking differently: Inject 20-22 Units into the skin at bedtime. 08/22/21  Yes Hawks, Christy A, FNP  levothyroxine (SYNTHROID) 88 MCG tablet TAKE 1 TABLET DAILY BEFORE BREAKFAST Patient taking differently: Take 88 mcg by mouth daily before breakfast. 04/24/22  Yes Hawks, Christy A, FNP  losartan-hydrochlorothiazide (HYZAAR) 50-12.5 MG tablet TAKE 1 TABLET DAILY Patient taking differently: Take 1 tablet by mouth daily. 04/24/22  Yes [name] A, FNP  pantoprazole (PROTONIX) 40 MG tablet TAKE 1 TABLET DAILY 02/03/22  Yes [name] A, FNP  Accu-Chek Softclix Lancets lancets Test BS TID Dx E11.9 12/02/21   [name] A, FNP  Alcohol Swabs (B-D SINGLE USE SWABS REGULAR) PADS Test BS 4 times daily Dx E11.9 04/02/21   Sharion Balloon, FNP  BD PEN NEEDLE NANO 2ND GEN 32G X 4 MM MISC Use twice daily with insulin Dx E11.9 11/25/21   Sharion Balloon, FNP  cilostazol (PLETAL) 50 MG tablet Take 1 tablet (50 mg total) by mouth 2 (two) times daily. Patient not taking: Reported on 04/27/2022 07/25/20   Lorretta Harp, MD  glucose blood (FREESTYLE LITE) test strip Test BS TID Dx E11.9 11/25/21   Sharion Balloon, FNP   Semaglutide,0.25 or 0.5MG/DOS, 2 MG/3ML SOPN Inject 0.25 mg into the skin once a week. Patient not taking: Reported on 04/27/2022 03/14/22   Sharion Balloon, FNP  metFORMIN (GLUCOPHAGE) 1000 MG tablet Take 1 tablet (1,000 mg total) by mouth 2 (two) times daily with a meal. Patient taking differently: Take 1,000 mg by mouth 2 (two) times daily with a meal. Only taking once a day 04/06/19 07/13/19  Terald Sleeper, PA-C    Current Facility-Administered Medications  Medication Dose Route Frequency Provider Last Rate Last Admin   0.9 %  sodium chloride infusion (Manually program via Guardrails IV Fluids)   Intravenous Once Ripley Fraise, MD       acetaminophen (TYLENOL) tablet 650 mg  650 mg Oral Q6H PRN Norval Morton, MD       Or   acetaminophen (TYLENOL) suppository 650 mg  650 mg Rectal Q6H PRN Fuller Plan A, MD       albuterol (PROVENTIL) (2.5 MG/3ML) 0.083% nebulizer solution 2.5 mg  2.5 mg Nebulization Q6H PRN Tamala Julian, Rondell A, MD       ezetimibe (ZETIA) tablet 10 mg  10 mg Oral Daily Smith, Rondell A, MD   10 mg at 04/27/22 1114   fenofibrate tablet 160 mg  160 mg Oral Daily Smith, Rondell A, MD   160 mg at 04/27/22 1114   guaiFENesin (MUCINEX) 12 hr tablet 600 mg  600 mg Oral BID Tamala Julian, Rondell A, MD   600 mg at 04/27/22 0933   insulin aspart (novoLOG) injection 0-15 Units  0-15 Units Subcutaneous Q6H Smith, Rondell A, MD   5 Units at 04/27/22 0932   [START ON 04/28/2022] levothyroxine (SYNTHROID) tablet 88 mcg  88 mcg Oral Q0600 Fuller Plan A, MD       ondansetron (ZOFRAN) tablet 4 mg  4 mg Oral Q6H PRN Fuller Plan A, MD       Or   ondansetron (ZOFRAN) injection 4 mg  4 mg Intravenous Q6H PRN Smith, Rondell A, MD       pantoprozole (PROTONIX) 80 mg /NS 100 mL infusion  8 mg/hr Intravenous Continuous Veryl Speak, MD 10 mL/hr  at 04/27/22 0415 8 mg/hr at 04/27/22 0415   sodium chloride flush (NS) 0.9 % injection 3 mL  3 mL Intravenous Q12H Fuller Plan A, MD   3 mL at 04/27/22  0933    Allergies as of 04/27/2022 - Review Complete 04/27/2022  Allergen Reaction Noted   Morphine and related Swelling and Other (See Comments) 06/14/2014   Latex Rash 07/06/2014   Statins Other (See Comments) 08/02/2014     Review of Systems:    As per HPI, otherwise negative    Physical Exam:  Vital signs in last 24 hours: Temp:  [97.7 F (36.5 C)-98.7 F (37.1 C)] 97.9 F (36.6 C) (02/18 1743) Pulse Rate:  [71-103] 83 (02/18 1743) Resp:  [12-22] 17 (02/18 1743) BP: (100-135)/(38-81) 117/81 (02/18 1743) SpO2:  [92 %-100 %] 100 % (02/18 1542) Weight:  [63.5 kg] 63.5 kg (02/18 0343) Last BM Date : 04/27/22 General:   Pleasant elderly Caucasian female in NAD Head:  Normocephalic and atraumatic. Eyes:   No icterus.   Conjunctiva pink. Ears:  Normal auditory acuity. Neck:  Supple Lungs:  Respirations even and unlabored. Lungs clear to auscultation bilaterally.   No wheezes, crackles, or rhonchi.  Heart:  Regular rate and rhythm; no MRG Abdomen:  Soft, nondistended, nontender. Normal bowel sounds. No appreciable masses or hepatomegaly.  Rectal:  Not performed.  Msk:  Symmetrical without gross deformities.  Extremities:  Without edema. Neurologic:  Alert and  oriented x4;  grossly normal neurologically. Skin:  Intact without significant lesions or rashes. Psych:  Alert and cooperative. Normal affect.  LAB RESULTS: Recent Labs    04/27/22 0155  WBC 6.4  HGB 6.8*  HCT 20.5*  PLT 259   BMET Recent Labs    04/27/22 0155  NA 134*  K 4.5  CL 105  CO2 19*  GLUCOSE 341*  BUN 20  CREATININE 1.37*  CALCIUM 9.3   LFT No results for input(s): "PROT", "ALBUMIN", "AST", "ALT", "ALKPHOS", "BILITOT", "BILIDIR", "IBILI" in the last 72 hours. PT/INR Recent Labs    04/27/22 0800  LABPROT 15.9*  INR 1.3*    STUDIES: DG Chest Port 1 View  Result Date: 04/27/2022 CLINICAL DATA:  Chest pain. EXAM: PORTABLE CHEST 1 VIEW COMPARISON:  10/20/2016. FINDINGS: The heart  size and mediastinal contours are within normal limits. There is atherosclerotic calcification of the aorta. Interstitial prominence is noted bilaterally. There are small bilateral pleural effusions. No pneumothorax. No acute osseous abnormality. IMPRESSION: 1. Interstitial prominence bilaterally, possible edema or infiltrate. 2. Small bilateral pleural effusions. Electronically Signed   By: Brett Fairy M.D.   On: 04/27/2022 02:20     PREVIOUS ENDOSCOPIES:            Colonoscopy September 2022 (Dr. Abbey Chatters) 4 small polyps in the transverse colon, left-sided diverticulosis, internal hemorrhoids   Impression / Plan:   82 year old female with history of peripheral artery disease and history of stroke, with several weeks of upper abdominal and chest discomfort and dark stools, found to have hemoglobin of 6.8 down from baseline of 12.  Fecal occult blood test positive.  BUN not significantly elevated.  She takes Plavix and has also been taking Bayer aspirin for the past several weeks.  History seems most consistent with an upper GI bleed, likely from peptic ulcer disease.  She is hemodynamically stable and appears well on exam. She had elevated troponins on presentation emergency department, which were felt to be demand ischemia by cardiology, and no anticoagulation was recommended.  Her troponins down trended and she is no longer having any symptoms.  Will plan for upper endoscopy tomorrow to assess source of bleeding.  Melena/acute blood loss anemia - EGD tomorrow - Protonix drip - Clear liquid diet tonight, then n.p.o. after midnight  Troponinemia - Demand ischemia in the setting of anemia, no plans for anticoagulation per cardiology, no concerns for ACS  The details, risks (including bleeding, perforation, infection, missed lesions, medication reactions and possible hospitalization or surgery if complications occur), benefits, and alternatives to EGD with possible biopsy and possible dilation  were discussed with the patient and she consents to proceed.   Dr. Tarri Glenn will be taking over the inpatient GI service tomorrow.     Thanks   LOS: 0 days   Daryel November  04/27/2022, 6:12 PM

## 2022-04-27 NOTE — ED Triage Notes (Signed)
[facility] form [facility], trop 357 and 457. Hgb 6.8 will need blood transfusion.

## 2022-04-27 NOTE — ED Notes (Signed)
[facility] at bedside for transport, report provided.

## 2022-04-27 NOTE — ED Notes (Signed)
Report given to charge RN at [facility] ED, Alyse Low ,[place]

## 2022-04-27 NOTE — ED Notes (Signed)
RN assisted pt to restroom using a wheelchair. Pt educated to use assist cord once finish to get back to her room. Pt verbalized understanding.

## 2022-04-27 NOTE — ED Provider Notes (Signed)
[facility] Provider Note   CSN: [id number] Arrival date & time: 04/27/22  0131     History  Chief Complaint  Patient presents with   Chest Pain   Abdominal Pain    Jessica Torres is a 82 y.o. female.  Patient is a 82 year old female with past medical history of type 2 diabetes, peripheral vascular disease, hyperlipidemia, hypothyroidism, GERD.  Patient presenting today with complaints of chest and abdominal discomfort.  She describes a 3-week history of "a hurting" in the center of her chest that radiates beneath both arms and causes her to feel short of breath.  She places a heating pad on her chest and this seems to help.  This evening, things seem to worsen and were not relieved with her heating pad, so presents for evaluation.  She also describes generalized abdominal discomfort and passing what she describes as "black stools".  She denies any fevers or chills.  She denies any cough.  She denies any prior cardiac history.  The history is provided by the patient.       Home Medications Prior to Admission medications   Medication Sig Start Date End Date Taking? Authorizing Provider  Semaglutide,0.25 or 0.5MG/DOS, 2 MG/3ML SOPN Inject 0.25 mg into the skin once a week. 03/14/22   Sharion Balloon, FNP  Accu-Chek Softclix Lancets lancets Test BS TID Dx E11.9 12/02/21   [name] A, FNP  Alcohol Swabs (B-D SINGLE USE SWABS REGULAR) PADS Test BS 4 times daily Dx E11.9 04/02/21   Sharion Balloon, FNP  baclofen (LIORESAL) 10 MG tablet Take 1 tablet (10 mg total) by mouth 3 (three) times daily. 11/28/21   Sharion Balloon, FNP  BD PEN NEEDLE NANO 2ND GEN 32G X 4 MM MISC Use twice daily with insulin Dx E11.9 11/25/21   Sharion Balloon, FNP  cetirizine (ZYRTEC ALLERGY) 10 MG tablet Take 1 tablet (10 mg total) by mouth daily. 02/21/22   Sharion Balloon, FNP  Cholecalciferol 25 MCG (1000 UT) capsule Take by mouth. 06/08/21 06/08/22  [provider]  cilostazol (PLETAL) 50 MG tablet Take 1 tablet (50 mg total) by mouth 2 (two) times daily. 07/25/20   Lorretta Harp, MD  clopidogrel (PLAVIX) 75 MG tablet TAKE 1 TABLET DAILY 04/02/22   [name] A, FNP  empagliflozin (JARDIANCE) 25 MG TABS tablet Take 25 mg by mouth daily.    [provider]  ezetimibe (ZETIA) 10 MG tablet Take 1 tablet (10 mg total) by mouth daily. 08/22/21   Sharion Balloon, FNP  fenofibrate 160 MG tablet TAKE 1 TABLET DAILY 04/02/22   [name] A, FNP  ferrous sulfate 325 (65 FE) MG EC tablet Take 325 mg by mouth once a week. 07/19/20 07/19/21  [provider]  fluticasone (FLONASE) 50 MCG/ACT nasal spray Place 2 sprays into both nostrils daily. 02/21/22   [name] A, FNP  glucose blood (FREESTYLE LITE) test strip Test BS TID Dx E11.9 11/25/21   [name] A, FNP  insulin glargine, 2 Unit Dial, (TOUJEO MAX SOLOSTAR) 300 UNIT/ML Solostar Pen INJECT 52 UNITS UNDER THE SKIN DAILY 03/18/22   [name] A, FNP  insulin lispro (HUMALOG) 100 UNIT/ML KwikPen Inject 10-20 Units into the skin 3 (three) times daily. 08/22/21   Sharion Balloon, FNP  levothyroxine (SYNTHROID) 88 MCG tablet TAKE 1 TABLET DAILY BEFORE BREAKFAST 04/24/22   [name] A, FNP  losartan-hydrochlorothiazide (HYZAAR) 50-12.5 MG tablet  TAKE 1 TABLET DAILY 04/24/22   [name] A, FNP  pantoprazole (PROTONIX) 40 MG tablet TAKE 1 TABLET DAILY 02/03/22   [name] A, FNP  metFORMIN (GLUCOPHAGE) 1000 MG tablet Take 1 tablet (1,000 mg total) by mouth 2 (two) times daily with a meal. Patient taking differently: Take 1,000 mg by mouth 2 (two) times daily with a meal. Only taking once a day 04/06/19 07/13/19  Terald Sleeper, PA-C      Allergies    Morphine and related, Latex, and Statins    Review of Systems   Review of Systems  All other systems reviewed and are negative.   Physical Exam Updated Vital Signs BP 118/60   Pulse (!) 102   Temp 98.3 F  (36.8 C)   Resp 16   SpO2 94%  Physical Exam Vitals and nursing note reviewed.  Constitutional:      General: She is not in acute distress.    Appearance: She is well-developed. She is not diaphoretic.  HENT:     Head: Normocephalic and atraumatic.  Cardiovascular:     Rate and Rhythm: Normal rate and regular rhythm.     Heart sounds: No murmur heard.    No friction rub. No gallop.  Pulmonary:     Effort: Pulmonary effort is normal. No respiratory distress.     Breath sounds: Normal breath sounds. No wheezing.  Abdominal:     General: Bowel sounds are normal. There is no distension.     Palpations: Abdomen is soft.     Tenderness: There is no abdominal tenderness.  Musculoskeletal:        General: Normal range of motion.     Cervical back: Normal range of motion and neck supple.     Right lower leg: No tenderness. No edema.     Left lower leg: No tenderness. No edema.  Skin:    General: Skin is warm and dry.  Neurological:     General: No focal deficit present.     Mental Status: She is alert and oriented to person, place, and time.     ED Results / Procedures / Treatments   Labs (all labs ordered are listed, but only abnormal results are displayed) Labs Reviewed  BASIC METABOLIC PANEL  CBC  TROPONIN I (HIGH SENSITIVITY)    EKG EKG Interpretation  Date/Time:  Sunday April 27 2022 01:41:50 EST Ventricular Rate:  105 PR Interval:  164 QRS Duration: 96 QT Interval:  342 QTC Calculation: 452 R Axis:   57 Text Interpretation: Sinus tachycardia Low voltage, extremity leads ST depression, consider ischemia, lateral lds Confirmed by Veryl Speak [phone]) on 04/27/2022 1:49:14 AM  Radiology No results found.  Procedures Procedures    Medications Ordered in ED Medications - No data to display  ED Course/ Medical Decision Making/ A&P  Patient is a 82 year old female with history of type 2 diabetes and peripheral vascular disease on Plavix.  Patient  presenting today with complaints of worsening chest discomfort over the past several weeks and is now having black stools.  Patient arrives here with stable vital signs.  She is somewhat pale, but otherwise clinically well-appearing.  Workup initiated including CBC, metabolic panel, and troponin.  This shows a hemoglobin of 6.8 and rectal examination reveals melanotic, heme positive stool.  Troponin also elevated at 357.  Chest x-ray shows interstitial prominence, the etiology of which I am uncertain.  Patient presenting here with chest discomfort and melanotic stools.  Patient has hemoglobin of  6.8 and heme positive stool consistent with GI bleed.  She also has some EKG changes and a troponin which is elevated at 357, possibly related to demand ischemia from her anemia.  She also may have some underlying coronary artery disease, but none that has been diagnosed thus far.  Care has been discussed with cardiology who feels as though there is no role for heparin given her GI bleed.  They recommended transfusion.  Patient to be admitted to the hospitalist service.  Dr. Marlowe Sax agrees to admit.  CRITICAL CARE Performed by: Veryl Speak Total critical care time: 45 minutes Critical care time was exclusive of separately billable procedures and treating other patients. Critical care was necessary to treat or prevent imminent or life-threatening deterioration. Critical care was time spent personally by me on the following activities: development of treatment plan with patient and/or surrogate as well as nursing, discussions with consultants, evaluation of patient's response to treatment, examination of patient, obtaining history from patient or surrogate, ordering and performing treatments and interventions, ordering and review of laboratory studies, ordering and review of radiographic studies, pulse oximetry and re-evaluation of patient's condition.   Final Clinical Impression(s) / ED Diagnoses Final  diagnoses:  None    Rx / DC Orders ED Discharge Orders     None         Veryl Speak, MD 04/27/22 [phone]

## 2022-04-27 NOTE — Consult Note (Signed)
Consultation  Referring Provider:     Dr. Fuller Plan Primary Care Physician:  Sharion Balloon, FNP Primary Gastroenterologist:     Dr. Hurshel Keys    Reason for Consultation:     Symptomatic anemia         HPI:   Jessica Torres is a 82 y.o. female with a history of peripheral artery disease, hypertension, diabetes and history of stroke who presented to the emergency department at [facility] earlier this morning with chest pain, abdominal discomfort and several weeks of dark stools.  The patient states that for several weeks now, she has been dealing with the persistent discomfort in her upper abdomen, chest and left side.  She finally decided to seek sick attention when the symptoms just would not go away.  She has had nausea and vomiting at least 1 occasion after eating some salsa at a [employer].  She reports that her stools for the last several weeks have been mostly black in color, but not necessarily tarry.  She has been taking Bayer aspirin, 325 mg frequently during the past couple weeks because of this pain and takes Plavix daily (either for peripheral vascular disease or history of stroke, not sure).  She denies taking any other NSAIDs.  She denies any prior history of any GI bleeding, upper or lower.  She thinks that she did have an upper endoscopy many years ago, but does not recall why.  She thinks it was normal.  She does have chronic GERD symptoms, but this is controlled with once daily Protonix, which she says she has been taking.  In the ED at [facility], her blood pressures were in the 90s and low 100s.  She was found to have a hemoglobin of 6.8, which is down from her baseline of 12.  BUN was not significantly elevated at 20.  She had mild troponin anemia.  Her case was reportedly discussed by cardiology, who felt that her troponin anemia was secondary to demand ischemia in the setting of severe anemia, and did not recommend anticoagulation.  [facility] this morning.  She has been on Protonix drip and received 2 units of packed red blood cells.  Repeat CBC pending.  Her troponins are downtrending.  She states that she feels much better today than she did yesterday.  She has not had any problems with the pain or discomfort in many hours.   She had a colonoscopy in September 2022 by Dr. Abbey Chatters for iron deficiency anemia. Colonoscopy was notable for 4 small polyps in the transverse colon, left-sided diverticulosis and internal hemorrhoids. No known history of previous upper endoscopy.   Past Medical History:  Diagnosis Date   Arthritis    "fingers probably" (10/30/2016)   Bulging lumbar disc    Chronic lower back pain    Difficulty urinating    GERD (gastroesophageal reflux disease)    Heart murmur    hx   Hyperlipidemia    statin intolerant   Hypertension    Hypothyroidism    PAD (peripheral artery disease) (HCC)    Peripheral arterial disease (HCC)    Shortness of breath dyspnea    Sleep apnea    "dx'd years ago; had severe problems; all of the sudden it just went away" (07/06/2014) *still gone" (10/30/2016)   Stroke [facility]) ~ 2010   denies residual on 10/30/2016   Type 2 diabetes mellitus ([place])    Urgency of urination    Uterine cancer (  [facility])     Past Surgical History:  Procedure Laterality Date   ANGIOPLASTY Right 01/22/2015   w/ atherectomy R sfa   BALLOON ANGIOPLASTY, ARTERY Right    SFA/notes 07/06/2014   BREAST EXCISIONAL BIOPSY Left    BREAST SURGERY     CARPAL TUNNEL RELEASE Right 1980's   COLONOSCOPY WITH PROPOFOL N/A 11/26/2020   Procedure: COLONOSCOPY WITH PROPOFOL;  Surgeon: Eloise Harman, DO;  [facility];  Service: Endoscopy;  Laterality: N/A;  9:00am, pt cannot come in earlier   [place]     "dr lanced it; in dr's office"   INCISION AND DRAINAGE BREAST ABSCESS Left    LOWER EXTREMITY ANGIOGRAM N/A 07/06/2014    Procedure: LOWER EXTREMITY ANGIOGRAM & R-SFA atherectomy and balloon angioplasty;  Surgeon: Lorretta Harp, MD;  Location: [facility] CATH LAB;  Service: Cardiovascular;  Laterality: R   LOWER EXTREMITY ANGIOGRAM  10/30/2016   LOWER EXTREMITY ANGIOGRAPHY N/A 10/30/2016   Procedure: Lower Extremity Angiography;  Surgeon: Lorretta Harp, MD;  Location: [place] CV LAB;  Service: Cardiovascular;  Laterality: N/A;   PERIPHERAL VASCULAR CATHETERIZATION N/A 08/17/2014   Procedure: Lower Extremity Angiography;  Surgeon: Lorretta Harp, MD;  Location: [place] CV LAB;  Service: Cardiovascular;  Laterality: N/A;   PERIPHERAL VASCULAR CATHETERIZATION Left 08/17/2014   Procedure: Peripheral Vascular Atherectomy and Viabahn stent L-SFA;  Surgeon: Lorretta Harp, MD; Laterality: Left; L-SFA   PERIPHERAL VASCULAR CATHETERIZATION N/A 01/11/2015   Procedure: Lower Extremity Angiography;  Surgeon: Lorretta Harp, MD;  Location: [place] CV LAB;  Service: Cardiovascular;  Laterality: N/A;   PERIPHERAL VASCULAR CATHETERIZATION Right 01/22/2015   Procedure: Peripheral Vascular Atherectomy;  Surgeon: Lorretta Harp, MD;  Location: [facility] LAB;  Service: Cardiovascular;  Laterality: Right;   POLYPECTOMY  11/26/2020   Procedure: POLYPECTOMY;  Surgeon: Eloise Harman, DO;  [facility];  Service: Endoscopy;;   TUBAL LIGATION     VAGINAL HYSTERECTOMY      Family History  Problem Relation Age of Onset   Heart attack Mother    Heart disease Mother    Arrhythmia Mother        has PPM   Heart disease Father    Diabetes Sister    Breast cancer Neg Hx      Social History   Tobacco Use   Smoking status: Never   Smokeless tobacco: Never  Vaping Use   Vaping Use: Never used  Substance Use Topics   Alcohol use: Yes    Alcohol/week: 0.0 standard drinks of alcohol    Comment: 10/30/2016  "margarita q  2 wks or so"   Drug use: No    Prior to Admission medications   Medication Sig  Start Date End Date Taking? Authorizing Provider  clopidogrel (PLAVIX) 75 MG tablet TAKE 1 TABLET DAILY 04/02/22  Yes Hawks, Christy A, FNP  empagliflozin (JARDIANCE) 25 MG TABS tablet Take 25 mg by mouth as needed (low blood sugar).   Yes [provider]  ezetimibe (ZETIA) 10 MG tablet Take 1 tablet (10 mg total) by mouth daily. 08/22/21  Yes Hawks, Alyse Low A, FNP  fenofibrate 160 MG tablet TAKE 1 TABLET DAILY 04/02/22  Yes Hawks, Christy A, FNP  insulin glargine, 2 Unit Dial, (TOUJEO MAX SOLOSTAR) 300 UNIT/ML Solostar Pen INJECT 52 UNITS UNDER THE SKIN DAILY Patient taking differently: Inject 52 Units into the skin at bedtime. 03/18/22  Yes Hawks, Christy A, FNP  insulin lispro (HUMALOG) 100 UNIT/ML KwikPen Inject 10-20 Units into the skin 3 (three) times daily. Patient taking differently: Inject 20-22 Units into the skin at bedtime. 08/22/21  Yes Hawks, Christy A, FNP  levothyroxine (SYNTHROID) 88 MCG tablet TAKE 1 TABLET DAILY BEFORE BREAKFAST Patient taking differently: Take 88 mcg by mouth daily before breakfast. 04/24/22  Yes Hawks, Christy A, FNP  losartan-hydrochlorothiazide (HYZAAR) 50-12.5 MG tablet TAKE 1 TABLET DAILY Patient taking differently: Take 1 tablet by mouth daily. 04/24/22  Yes [name] A, FNP  pantoprazole (PROTONIX) 40 MG tablet TAKE 1 TABLET DAILY 02/03/22  Yes [name] A, FNP  Accu-Chek Softclix Lancets lancets Test BS TID Dx E11.9 12/02/21   [name] A, FNP  Alcohol Swabs (B-D SINGLE USE SWABS REGULAR) PADS Test BS 4 times daily Dx E11.9 04/02/21   Sharion Balloon, FNP  BD PEN NEEDLE NANO 2ND GEN 32G X 4 MM MISC Use twice daily with insulin Dx E11.9 11/25/21   Sharion Balloon, FNP  cilostazol (PLETAL) 50 MG tablet Take 1 tablet (50 mg total) by mouth 2 (two) times daily. Patient not taking: Reported on 04/27/2022 07/25/20   Lorretta Harp, MD  glucose blood (FREESTYLE LITE) test strip Test BS TID Dx E11.9 11/25/21   Sharion Balloon, FNP   Semaglutide,0.25 or 0.5MG/DOS, 2 MG/3ML SOPN Inject 0.25 mg into the skin once a week. Patient not taking: Reported on 04/27/2022 03/14/22   Sharion Balloon, FNP  metFORMIN (GLUCOPHAGE) 1000 MG tablet Take 1 tablet (1,000 mg total) by mouth 2 (two) times daily with a meal. Patient taking differently: Take 1,000 mg by mouth 2 (two) times daily with a meal. Only taking once a day 04/06/19 07/13/19  Terald Sleeper, PA-C    Current Facility-Administered Medications  Medication Dose Route Frequency Provider Last Rate Last Admin   0.9 %  sodium chloride infusion (Manually program via Guardrails IV Fluids)   Intravenous Once Ripley Fraise, MD       acetaminophen (TYLENOL) tablet 650 mg  650 mg Oral Q6H PRN Norval Morton, MD       Or   acetaminophen (TYLENOL) suppository 650 mg  650 mg Rectal Q6H PRN Fuller Plan A, MD       albuterol (PROVENTIL) (2.5 MG/3ML) 0.083% nebulizer solution 2.5 mg  2.5 mg Nebulization Q6H PRN Tamala Julian, Rondell A, MD       ezetimibe (ZETIA) tablet 10 mg  10 mg Oral Daily Smith, Rondell A, MD   10 mg at 04/27/22 1114   fenofibrate tablet 160 mg  160 mg Oral Daily Smith, Rondell A, MD   160 mg at 04/27/22 1114   guaiFENesin (MUCINEX) 12 hr tablet 600 mg  600 mg Oral BID Tamala Julian, Rondell A, MD   600 mg at 04/27/22 0933   insulin aspart (novoLOG) injection 0-15 Units  0-15 Units Subcutaneous Q6H Smith, Rondell A, MD   5 Units at 04/27/22 0932   [START ON 04/28/2022] levothyroxine (SYNTHROID) tablet 88 mcg  88 mcg Oral Q0600 Fuller Plan A, MD       ondansetron (ZOFRAN) tablet 4 mg  4 mg Oral Q6H PRN Fuller Plan A, MD       Or   ondansetron (ZOFRAN) injection 4 mg  4 mg Intravenous Q6H PRN Smith, Rondell A, MD       pantoprozole (PROTONIX) 80 mg /NS 100 mL infusion  8 mg/hr Intravenous Continuous Veryl Speak, MD 10 mL/hr  at 04/27/22 0415 8 mg/hr at 04/27/22 0415   sodium chloride flush (NS) 0.9 % injection 3 mL  3 mL Intravenous Q12H Fuller Plan A, MD   3 mL at 04/27/22  0933    Allergies as of 04/27/2022 - Review Complete 04/27/2022  Allergen Reaction Noted   Morphine and related Swelling and Other (See Comments) 06/14/2014   Latex Rash 07/06/2014   Statins Other (See Comments) 08/02/2014     Review of Systems:    As per HPI, otherwise negative    Physical Exam:  Vital signs in last 24 hours: Temp:  [97.7 F (36.5 C)-98.7 F (37.1 C)] 97.9 F (36.6 C) (02/18 1743) Pulse Rate:  [71-103] 83 (02/18 1743) Resp:  [12-22] 17 (02/18 1743) BP: (100-135)/(38-81) 117/81 (02/18 1743) SpO2:  [92 %-100 %] 100 % (02/18 1542) Weight:  [63.5 kg] 63.5 kg (02/18 0343) Last BM Date : 04/27/22 General:   Pleasant elderly Caucasian female in NAD Head:  Normocephalic and atraumatic. Eyes:   No icterus.   Conjunctiva pink. Ears:  Normal auditory acuity. Neck:  Supple Lungs:  Respirations even and unlabored. Lungs clear to auscultation bilaterally.   No wheezes, crackles, or rhonchi.  Heart:  Regular rate and rhythm; no MRG Abdomen:  Soft, nondistended, nontender. Normal bowel sounds. No appreciable masses or hepatomegaly.  Rectal:  Not performed.  Msk:  Symmetrical without gross deformities.  Extremities:  Without edema. Neurologic:  Alert and  oriented x4;  grossly normal neurologically. Skin:  Intact without significant lesions or rashes. Psych:  Alert and cooperative. Normal affect.  LAB RESULTS: Recent Labs    04/27/22 0155  WBC 6.4  HGB 6.8*  HCT 20.5*  PLT 259   BMET Recent Labs    04/27/22 0155  NA 134*  K 4.5  CL 105  CO2 19*  GLUCOSE 341*  BUN 20  CREATININE 1.37*  CALCIUM 9.3   LFT No results for input(s): "PROT", "ALBUMIN", "AST", "ALT", "ALKPHOS", "BILITOT", "BILIDIR", "IBILI" in the last 72 hours. PT/INR Recent Labs    04/27/22 0800  LABPROT 15.9*  INR 1.3*    STUDIES: DG Chest Port 1 View  Result Date: 04/27/2022 CLINICAL DATA:  Chest pain. EXAM: PORTABLE CHEST 1 VIEW COMPARISON:  10/20/2016. FINDINGS: The heart  size and mediastinal contours are within normal limits. There is atherosclerotic calcification of the aorta. Interstitial prominence is noted bilaterally. There are small bilateral pleural effusions. No pneumothorax. No acute osseous abnormality. IMPRESSION: 1. Interstitial prominence bilaterally, possible edema or infiltrate. 2. Small bilateral pleural effusions. Electronically Signed   By: Brett Fairy M.D.   On: 04/27/2022 02:20     PREVIOUS ENDOSCOPIES:            Colonoscopy September 2022 (Dr. Abbey Chatters) 4 small polyps in the transverse colon, left-sided diverticulosis, internal hemorrhoids   Impression / Plan:   81 year old female with history of peripheral artery disease and history of stroke, with several weeks of upper abdominal and chest discomfort and dark stools, found to have hemoglobin of 6.8 down from baseline of 12.  Fecal occult blood test positive.  BUN not significantly elevated.  She takes Plavix and has also been taking Bayer aspirin for the past several weeks.  History seems most consistent with an upper GI bleed, likely from peptic ulcer disease.  She is hemodynamically stable and appears well on exam. She had elevated troponins on presentation emergency department, which were felt to be demand ischemia by cardiology, and no anticoagulation was recommended.  Her troponins down trended and she is no longer having any symptoms.  Will plan for upper endoscopy tomorrow to assess source of bleeding.  Melena/acute blood loss anemia - EGD tomorrow - Protonix drip - Clear liquid diet tonight, then n.p.o. after midnight  Troponinemia - Demand ischemia in the setting of anemia, no plans for anticoagulation per cardiology, no concerns for ACS  The details, risks (including bleeding, perforation, infection, missed lesions, medication reactions and possible hospitalization or surgery if complications occur), benefits, and alternatives to EGD with possible biopsy and possible dilation  were discussed with the patient and she consents to proceed.   Dr. Tarri Glenn will be taking over the inpatient GI service tomorrow.     Thanks   LOS: 0 days   Daryel November  04/27/2022, 6:12 PM

## 2022-04-27 NOTE — Progress Notes (Deleted)
It was signed out that patient would be seen by Dr. [facility], but had not been seen.  Repaged [facility] and talked with Dr. Fuller Plan who stated that the patient was not on their list to be seen.  He stated that he was making sure she was on the list to be seen and to keep her n.p.o. after midnight for possible need procedure in a.m.

## 2022-04-27 NOTE — ED Triage Notes (Signed)
Pt bib [facility] from [facility] trop 357 and 457, fecal occult positive. Will need blood transfusion.

## 2022-04-27 NOTE — Plan of Care (Signed)
[facility] will assume care on arrival to accepting facility. Until arrival, care as per EDP. However, [facility] available 24/7 for questions and assistance.  Nursing staff, please page [facility] Admits and Consults ([phone]) as soon as the patient arrives to the hospital.   

## 2022-04-27 NOTE — ED Provider Notes (Signed)
Per previous ED provider, patient will be seen by Dr. Candis Schatz of gastroenterology   Ripley Fraise, MD 04/27/22 [phone]

## 2022-04-27 NOTE — ED Notes (Signed)
Troponin 357. Dr. Stark Jock aware.

## 2022-04-28 ENCOUNTER — Encounter (HOSPITAL_COMMUNITY): Payer: Self-pay | Admitting: Internal Medicine

## 2022-04-28 ENCOUNTER — Inpatient Hospital Stay (HOSPITAL_COMMUNITY): Payer: Medicare HMO | Admitting: Certified Registered Nurse Anesthetist

## 2022-04-28 ENCOUNTER — Encounter (HOSPITAL_COMMUNITY)
Admission: EM | Disposition: A | Discharge status: Home / Self Care | Payer: Self-pay | Source: Home / Self Care | Attending: Family Medicine

## 2022-04-28 ENCOUNTER — Inpatient Hospital Stay (HOSPITAL_COMMUNITY): Payer: Medicare HMO

## 2022-04-28 DIAGNOSIS — R7989 Other specified abnormal findings of blood chemistry: Secondary | ICD-10-CM

## 2022-04-28 DIAGNOSIS — D62 Acute posthemorrhagic anemia: Secondary | ICD-10-CM | POA: Diagnosis not present

## 2022-04-28 DIAGNOSIS — K317 Polyp of stomach and duodenum: Secondary | ICD-10-CM

## 2022-04-28 DIAGNOSIS — I252 Old myocardial infarction: Secondary | ICD-10-CM

## 2022-04-28 DIAGNOSIS — I2510 Atherosclerotic heart disease of native coronary artery without angina pectoris: Secondary | ICD-10-CM

## 2022-04-28 DIAGNOSIS — Z7984 Long term (current) use of oral hypoglycemic drugs: Secondary | ICD-10-CM

## 2022-04-28 DIAGNOSIS — Z794 Long term (current) use of insulin: Secondary | ICD-10-CM

## 2022-04-28 DIAGNOSIS — E1151 Type 2 diabetes mellitus with diabetic peripheral angiopathy without gangrene: Secondary | ICD-10-CM

## 2022-04-28 DIAGNOSIS — K2970 Gastritis, unspecified, without bleeding: Secondary | ICD-10-CM

## 2022-04-28 DIAGNOSIS — I10 Essential (primary) hypertension: Secondary | ICD-10-CM

## 2022-04-28 DIAGNOSIS — K3189 Other diseases of stomach and duodenum: Secondary | ICD-10-CM

## 2022-04-28 DIAGNOSIS — R079 Chest pain, unspecified: Secondary | ICD-10-CM

## 2022-04-28 HISTORY — PX: BIOPSY: SHX5522

## 2022-04-28 HISTORY — PX: ESOPHAGOGASTRODUODENOSCOPY: SHX5428

## 2022-04-28 LAB — TYPE AND SCREEN
ABO/RH(D): O NEG
Antibody Screen: NEGATIVE
Unit division: 0
Unit division: 0

## 2022-04-28 LAB — CBC
HCT: 27.5 % — ABNORMAL LOW (ref 36.0–46.0)
Hemoglobin: 9.5 g/dL — ABNORMAL LOW (ref 12.0–15.0)
MCH: 29.3 pg (ref 26.0–34.0)
MCHC: 34.5 g/dL (ref 30.0–36.0)
MCV: 84.9 fL (ref 80.0–100.0)
Platelets: 209 10*3/uL (ref 150–400)
RBC: 3.24 MIL/uL — ABNORMAL LOW (ref 3.87–5.11)
RDW: 14.9 % (ref 11.5–15.5)
WBC: 7.2 10*3/uL (ref 4.0–10.5)
nRBC: 0 % (ref 0.0–0.2)

## 2022-04-28 LAB — BPAM RBC
Blood Product Expiration Date: 202403032359
Blood Product Expiration Date: 202403032359
ISSUE DATE / TIME: 202402181237
ISSUE DATE / TIME: 202402181754
Unit Type and Rh: 9500
Unit Type and Rh: 9500

## 2022-04-28 LAB — GLUCOSE, CAPILLARY
Glucose-Capillary: 88 mg/dL (ref 70–99)
Glucose-Capillary: 194 mg/dL — ABNORMAL HIGH (ref 70–99)
Glucose-Capillary: 81 mg/dL (ref 70–99)
Glucose-Capillary: 94 mg/dL (ref 70–99)
Glucose-Capillary: 103 mg/dL — ABNORMAL HIGH (ref 70–99)
Glucose-Capillary: 84 mg/dL (ref 70–99)
Glucose-Capillary: 147 mg/dL — ABNORMAL HIGH (ref 70–99)
Glucose-Capillary: 266 mg/dL — ABNORMAL HIGH (ref 70–99)

## 2022-04-28 LAB — BASIC METABOLIC PANEL
Anion gap: 6 (ref 5–15)
BUN: 15 mg/dL (ref 8–23)
CO2: 25 mmol/L (ref 22–32)
Calcium: 8.8 mg/dL — ABNORMAL LOW (ref 8.9–10.3)
Chloride: 104 mmol/L (ref 98–111)
Creatinine, Ser: 1.44 mg/dL — ABNORMAL HIGH (ref 0.44–1.00)
GFR, Estimated: 37 mL/min — ABNORMAL LOW (ref >60)
Glucose, Bld: 83 mg/dL (ref 70–99)
Potassium: 3.5 mmol/L (ref 3.5–5.1)
Sodium: 135 mmol/L (ref 135–145)

## 2022-04-28 LAB — ECHOCARDIOGRAM COMPLETE
Area-P 1/2: 4.39 cm2
Height: 65 in
S' Lateral: 2.3 cm
Weight: 2229.29 oz

## 2022-04-28 LAB — PREPARE RBC (CROSSMATCH)

## 2022-04-28 SURGERY — EGD (ESOPHAGOGASTRODUODENOSCOPY)
Anesthesia: Monitor Anesthesia Care

## 2022-04-28 MED ORDER — PROPOFOL 500 MG/50ML IV EMUL
INTRAVENOUS | Status: DC | PRN
  Administered 2022-04-28: 125 ug/kg/min via INTRAVENOUS

## 2022-04-28 MED ORDER — PROPOFOL 10 MG/ML IV BOLUS
INTRAVENOUS | Status: DC | PRN
  Administered 2022-04-28 (×2): 10 mg via INTRAVENOUS

## 2022-04-28 MED ORDER — LIP MEDEX EX OINT
TOPICAL_OINTMENT | CUTANEOUS | Status: DC | PRN
  Filled 2022-04-28: qty 7

## 2022-04-28 MED ORDER — INSULIN ASPART 100 UNIT/ML IJ SOLN
0.0000 [IU] | Freq: Three times a day (TID) | INTRAMUSCULAR | Status: DC
  Administered 2022-04-28: 2 [IU] via SUBCUTANEOUS
  Administered 2022-04-29: 3 [IU] via SUBCUTANEOUS
  Administered 2022-04-29: 8 [IU] via SUBCUTANEOUS
  Administered 2022-04-29: 5 [IU] via SUBCUTANEOUS
  Administered 2022-04-30: 3 [IU] via SUBCUTANEOUS

## 2022-04-28 MED ORDER — LIDOCAINE 2% (20 MG/ML) 5 ML SYRINGE
INTRAMUSCULAR | Status: DC | PRN
  Administered 2022-04-28: 20 mg via INTRAVENOUS
  Administered 2022-04-28: 40 mg via INTRAVENOUS

## 2022-04-28 MED ORDER — ENSURE ENLIVE PO LIQD
237.0000 mL | Freq: Two times a day (BID) | ORAL | Status: DC
  Administered 2022-04-29: 237 mL via ORAL

## 2022-04-28 MED ORDER — PANTOPRAZOLE SODIUM 40 MG PO TBEC
40.0000 mg | DELAYED_RELEASE_TABLET | Freq: Two times a day (BID) | ORAL | Status: DC
  Administered 2022-04-28 – 2022-04-29 (×3): 40 mg via ORAL
  Filled 2022-04-28 (×3): qty 1

## 2022-04-28 MED ORDER — ADULT MULTIVITAMIN W/MINERALS CH
1.0000 | ORAL_TABLET | Freq: Every day | ORAL | Status: DC
  Administered 2022-04-29: 1 via ORAL
  Filled 2022-04-28: qty 1

## 2022-04-28 NOTE — Transfer of Care (Signed)
Immediate Anesthesia Transfer of Care Note  [name]  Procedure(s) Performed: ESOPHAGOGASTRODUODENOSCOPY (EGD)  Patient Location: PACU  Anesthesia Type:MAC  Level of Consciousness: awake, drowsy, and patient cooperative  Airway & Oxygen Therapy: Patient Spontanous Breathing  Post-op Assessment: Report given to RN and Post -op Vital signs reviewed and stable  Post vital signs: Reviewed and stable  Last Vitals:  Vitals Value Taken Time  BP 117/48 04/28/22 1011  Temp    Pulse 82 04/28/22 1013  Resp 17 04/28/22 1013  SpO2 96 % 04/28/22 1013  Vitals shown include unvalidated device data.  Last Pain:  Vitals:   04/28/22 0936  TempSrc: Temporal  PainSc: 0-No pain         Complications: No notable events documented.

## 2022-04-28 NOTE — Op Note (Addendum)
[facility] Patient Name: Jessica Torres Procedure Date : 04/28/2022 MRN: [id number] Attending MD: Thornton Park MD, MD, [id number] Date of Birth: November 20, 1940 CSN: [id number] Age: 82 Admit Type: Inpatient Procedure:                Upper GI endoscopy Indications:              Suspected upper gastrointestinal bleeding Providers:                Thornton Park MD, MD, Jamison Neighbor RN, RN,                            Benetta Spar, Technician Referring MD:              Medicines:                Monitored Anesthesia Care Complications:            No immediate complications. Estimated Blood Loss:     Estimated blood loss was minimal. Procedure:                Pre-Anesthesia Assessment:                           - Prior to the procedure, a History and Physical                            was performed, and patient medications and                            allergies were reviewed. The patient's tolerance of                            previous anesthesia was also reviewed. The risks                            and benefits of the procedure and the sedation                            options and risks were discussed with the patient.                            All questions were answered, and informed consent                            was obtained. Prior Anticoagulants: The patient has                            taken Plavix (clopidogrel), last dose was 3 days                            prior to procedure. ASA Grade Assessment: III - A                            patient with severe systemic disease. After  reviewing the risks and benefits, the patient was                            deemed in satisfactory condition to undergo the                            procedure.                           After obtaining informed consent, the endoscope was                            passed under direct vision. Throughout the                            procedure,  the patient's blood pressure, pulse, and                            oxygen saturations were monitored continuously. The                            [id number] Olympus endoscope was introduced                            through the mouth, and advanced to the second part                            of duodenum. The upper GI endoscopy was                            accomplished without difficulty. The patient                            tolerated the procedure well. Scope In: Scope Out: Findings:      The esophagus was normal.      Diffuse mildly erythematous mucosa with bleeding on contact was found in       the gastric antrum. The the prepylorus region the mucosa appears       congested and verrucous. Biopsies were taken with a cold forceps for       histology. Estimated blood loss was minimal.      A few small sessile polyps with no bleeding and no stigmata of recent       bleeding were found in the gastric fundus and in the gastric body.       Biopsies were taken with a cold forceps for histology. Estimated blood       loss was minimal.      The examined duodenum was normal. Biopsies were taken with a cold       forceps for histology. Estimated blood loss was minimal.      The cardia and gastric fundus were normal on retroflexion.      The exam was otherwise without abnormality. Impression:               - Normal esophagus.                           -  Erythematous mucosa in the antrum. 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 Biopsied.                           - A few gastric polyps. Biopsied.                           - Normal examined duodenum. Biopsied.                           - The examination was otherwise normal. Recommendation:           - Return patient to hospital ward for ongoing care.                           - Advance diet as tolerated today.                           - Continue present medications.                           - Continue  Protonix 40 mg BID.                           - Await pathology results.                           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 Procedure Code(s):        --- Professional ---                           [id number], Esophagogastroduodenoscopy, flexible,                            transoral; with biopsy, single or multiple Diagnosis Code(s):        --- Professional ---                           K31.89, Other diseases of stomach and duodenum                           K31.7, Polyp of stomach and duodenum CPT copyright 2022 [employer] Association. All rights reserved. The codes documented in this report are preliminary and upon coder review may  be revised to meet current compliance requirements. Thornton Park MD, MD 04/28/2022 10:17:54 AM This report has been signed electronically. Number of Addenda: 0

## 2022-04-28 NOTE — Interval H&P Note (Signed)
History and Physical Interval Note:  04/28/2022 9:44 AM  Jessica Torres  has presented today for surgery, with the diagnosis of Melena, acute blood loss anemia.  The various methods of treatment have been discussed with the patient and family. After consideration of risks, benefits and other options for treatment, the patient has consented to  Procedure(s): ESOPHAGOGASTRODUODENOSCOPY (EGD) (N/A) as a surgical intervention.  The patient's history has been reviewed, patient examined, no change in status, stable for surgery.  I have reviewed the patient's chart and labs.  Questions were answered to the patient's satisfaction.     Thornton Park

## 2022-04-28 NOTE — Progress Notes (Signed)
[facility] PROGRESS NOTE  Jessica Torres (DOB: 03/26/1940) FN:[id number] PCP: Sharion Balloon, FNP Outpatient Specialists: Cardiology Dr. Gwenlyn Found  Brief Narrative: Jessica Torres is a 82 y.o. female with a history of stroke, PAD s/p SFA DES on plavix, T2DM, HTN, HLD, hypothyroidism who presented to the ED on 04/27/2022 with chest/upper abdominal pain for which she was also taking full dose aspirin and dark stools. Hemoglobin was found to be 6.8g/dl from a baseline of 12g/dl. +FOBT. Troponin elevated as well (357) thought to be secondary to symptomatic anemia. She was admitted, plavix held, PPI gtt started, given 3u PRBCs with improvement in hemoglobin to 9.5g/dl with no subsequent gross bleeding noted. EGD 2/19 revealed erythematous antral mucosa suspected to be gastritis and/or GAVE as well as gastric polyps. Biopsies taken and patient scheduled for EGD after 5 day plavix washout with intention for APC therapy.   Subjective: No further chest discomfort, feels better.   Objective: BP (!) 154/53 (BP Location: Left Arm)   Pulse 80   Temp 98.2 F (36.8 C) (Oral)   Resp 17   Ht 5' 5"$  (1.651 m)   Wt 63.2 kg   SpO2 100%   BMI 23.19 kg/m   Gen: No distress, elderly Pulm: Clear, nonlabored  CV: RRR, no MRG or edema GI: Soft, NT, ND, +BS  Neuro: Alert and oriented. No new focal deficits. Ext: Warm, no deformities. Skin: No rashes, lesions or ulcers on visualized skin   EGD 2/19 Dr. Tarri Glenn:  Impression:       - Normal esophagus.                           - Erythematous mucosa in the antrum. 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 Biopsied.                           - A few gastric polyps. Biopsied.                           - Normal examined duodenum. Biopsied.                           - The examination was otherwise normal. Recommendation: - Return patient to hospital ward for ongoing care.                           - Advance diet as tolerated today.                            - Continue present medications.                           - Continue Protonix 40 mg BID.                           - Await pathology results.                           - Repeat upper endoscopy after a full 5 day Plavix  wash-out for APC therapy for suspected GAVE if                            biopsies exclude a concurrent etiology.  Assessment & Plan: Acute symptomatic blood loss anemia due to GI bleeding: s/p 3u PRBCs 2/18. - Continue monitoring CBC in AM, hgb up as anticipated with transfusions, symptoms resolved.   Gastritis, GAVE: High degree of suspicion that this has caused significant anemia on plavix. Bleeding can reasonably be expected to recur with reinitiation of antiplatelet agents.  - D/w GI Dr. Tarri Glenn, given her last dose of plavix 2/16, we will plan (pending biopsy results) to pursue EGD 2/21 at which time APC can be performed for GAVE treatment, after which plavix can be restarted if hgb stable.  - Continue PPI, change to BID per GI - Follow up pathology.  Type II NSTEMI:  - Will follow up echocardiogram to screen for wall motion abnormalities.  - Keep on telemetry - Unable to give antiplatelet or anticoagulation at this time. She is chest pain free.   History of stroke, PAD s/p SFA DES placement 2018 by Dr. Gwenlyn Found:  - Pt has strong indications for antiplatelet therapy, will aim to restart once cleared by GI to do so.   Elevated BNP: Level is 499. CXR with interstitial prominence, small effusions. Last echo 123456, normal diastolic function (Q000111Q). Denies symptoms of heart failure at this time.  - Repeat echocardiogram pending.  - Given IV lasix with transfusions, appear euvolemic at this time.   IDT2DM: HbA1c 6.6%. - Continue SSI, change to AC/HS. Usually takes 52u daily toujeo and 20-22u fast acting but has not required any insulin here and CBGs 84-103 today. Will not restart home regimen yet. - Hold oral agents, pt not  taking ozempic.  HTN:  - BP relatively soft, so will continue to hold home losartan, HCTZ  Stage IIIb CKD:  - Avoid nephrotoxins  HLD: Statin intolerance noted - Continue zetia.  Hypothyroidism: TSH 3.412 - Continue synthroid  Patrecia Pour, MD [facility] [email or web address] 04/28/2022, 2:39 PM

## 2022-04-28 NOTE — Care Management (Signed)
  Transition of Care [facility]) Screening Note   Patient Details  Name: Jessica Torres Date of Birth: [birth year]   Transition of Care [facility]) CM/SW Contact:    Bethena Roys, RN Phone Number: 04/28/2022, 12:08 PM    Transition of Care Department [facility]) has reviewed the patient and no TOC needs have been identified at this time. Patient presented for symptomatic anemia- plan for EGD 04-30-22 per notes. We will continue to monitor patient advancement through interdisciplinary progression rounds. If new patient transition needs arise, please place a TOC consult.

## 2022-04-28 NOTE — Anesthesia Preprocedure Evaluation (Addendum)
Anesthesia Evaluation  Patient identified by MRN, date of birth, ID band Patient awake    Reviewed: Allergy & Precautions, NPO status , Patient's Chart, lab work & pertinent test results  Airway Mallampati: II  TM Distance: >3 FB Neck ROM: Full    Dental  (+) Poor Dentition, Caps, Dental Advisory Given, Missing   Pulmonary shortness of breath, sleep apnea    Pulmonary exam normal breath sounds clear to auscultation       Cardiovascular hypertension, Pt. on medications + CAD, + Past MI and + Peripheral Vascular Disease  Normal cardiovascular exam Rhythm:Regular Rate:Normal  Stent left femoral artery   Neuro/Psych  Neuromuscular disease CVA  negative psych ROS   GI/Hepatic Neg liver ROS,GERD  Medicated,,  Endo/Other  diabetes, Well Controlled, Type 2, Insulin Dependent, Oral Hypoglycemic AgentsHypothyroidism    Renal/GU Renal InsufficiencyRenal disease  negative genitourinary   Musculoskeletal  (+) Arthritis , Osteoarthritis,    Abdominal   Peds  Hematology  (+) Blood dyscrasia, anemia Plavix therapy- last doe 2 days ago   Anesthesia Other Findings   Reproductive/Obstetrics                              Anesthesia Physical Anesthesia Plan  ASA: 3  Anesthesia Plan: MAC   Post-op Pain Management: Minimal or no pain anticipated   Induction: Intravenous  PONV Risk Score and Plan: 0  Airway Management Planned: Natural Airway and Simple Face Mask  Additional Equipment: None  Intra-op Plan:   Post-operative Plan:   Informed Consent: I have reviewed the patients History and Physical, chart, labs and discussed the procedure including the risks, benefits and alternatives for the proposed anesthesia with the patient or authorized representative who has indicated his/her understanding and acceptance.     Dental advisory given  Plan Discussed with: CRNA and Anesthesiologist  Anesthesia  Plan Comments:         Anesthesia Quick Evaluation

## 2022-04-28 NOTE — Anesthesia Procedure Notes (Signed)
Procedure Name: MAC Date/Time: 04/28/2022 9:55 AM  Performed by: Dorthea Cove, CRNAPre-anesthesia Checklist: Patient identified, Emergency Drugs available, Suction available, Patient being monitored and Timeout performed Patient Re-evaluated:Patient Re-evaluated prior to induction Oxygen Delivery Method: Nasal cannula Preoxygenation: Pre-oxygenation with 100% oxygen Induction Type: IV induction Placement Confirmation: positive ETCO2 and CO2 detector

## 2022-04-28 NOTE — Progress Notes (Signed)
Initial Nutrition Assessment  DOCUMENTATION CODES:   Not applicable  INTERVENTION:  Liberalize diet to regular to remove restrictions and encourage oral intake.  Provide Ensure Enlive po BID, each supplement provides 350 kcal and 20 grams of protein.  Provide multivitamin with minerals po daily.  NUTRITION DIAGNOSIS:   Inadequate oral intake related to decreased appetite (abdominal pain) as evidenced by per patient/family report.  GOAL:   Patient will meet greater than or equal to 90% of their needs  MONITOR:   PO intake, Supplement acceptance, Labs, Weight trends, I & O's  REASON FOR ASSESSMENT:   Malnutrition Screening Tool    ASSESSMENT:   82 year old female with PMHx of stroke, PAD s/p SFA DES on plavix, type 2 DM, HTN, HLD, hypothyroidism admitted with chest/upper abdominal pain  and dark stools found to have acute symptomatic blood loss anemia due to GI bleeding, gastritis, GAVE, type II NSTEMI.  2/19: underwent EGD that showed erythematous antral mucosa suspected to be gastritis and/or GAVE as well as gastric polyps. Biopsies taken and pt scheduled for EGD after 5 day plavix washout with intention for APC therapy.  Met with pt at bedside. She reports she is hungry today as yesterday she was on clear liquids and then NPO at midnight. She reports she didn't eat well at lunch because she didn't like the salad. She reports her appetite was decreased for several weeks PTA. She typically eats 2 meals per day. For breakfast she has a biscuit. She usually skips lunch. For dinner she has takeout from [place], or [employer] as she no longer cooks. She denies food allergies or intolerances. Denies nausea or emesis. Endorses still having some abdominal pain. Denies difficulty chewing/swallowing. Pt would benefit from liberalized diet to help improve oral intake. She is also amenable to drinking Ensure to help meet estimated needs.  Pt reports her UBW was around 145 lbs  (65.9 kg) and that she has lost weight over time. Per review of chart pt was 67.1 kg on 11/28/21. She is currently 63.2 kg (139.33 lbs). She has lost 3.9 kg or 5.8% weight over the past 5 months, which is not significant for time frame.  Medications reviewed and include: fenofibrate, Novolog 0-15 units TID, levothyroxine, pantoprazole,   Labs reviewed: CBG 81-147, Creatinine 1.44 Hemoglobin A1c 6.6 04/27/22  UOP: 1650 mL (1.1 mL/kg/hr) + 1 occurrence unmeasured UOP  I/O: -437.7 mL since admission  Patient is at risk for development of malnutrition.  NUTRITION - FOCUSED PHYSICAL EXAM:  Flowsheet Row Most Recent Value  Orbital Region No depletion  Upper Arm Region Mild depletion  Thoracic and Lumbar Region No depletion  Buccal Region No depletion  Temple Region No depletion  Clavicle Bone Region No depletion  Clavicle and Acromion Bone Region Mild depletion  Scapular Bone Region No depletion  Dorsal Hand Moderate depletion  Patellar Region Mild depletion  Anterior Thigh Region Mild depletion  Posterior Calf Region Moderate depletion  Edema (RD Assessment) None  Hair Reviewed  Eyes Reviewed  Mouth Reviewed  Skin Reviewed  Nails Reviewed       Diet Order:   Diet Order             Diet heart healthy/carb modified Room service appropriate? Yes; Fluid consistency: Thin  Diet effective now                  EDUCATION NEEDS:   No education needs have been identified at this time  Skin:  Skin  Assessment: Reviewed RN Assessment  Last BM:  04/27/22  Height:   Ht Readings from Last 1 Encounters:  04/27/22 5' 5"$  (1.651 m)   Weight:   Wt Readings from Last 1 Encounters:  04/28/22 63.2 kg   Ideal Body Weight:  56.8 kg  BMI:  Body mass index is 23.19 kg/m.  Estimated Nutritional Needs:   Kcal:  1600-1800  Protein:  80-90 grams  Fluid:  1.6-1.8 L/day  Loanne Drilling, MS, RD, LDN, CNSC Pager number available on Amion

## 2022-04-28 NOTE — Anesthesia Postprocedure Evaluation (Signed)
Anesthesia Post Note  [name]  Procedure(s) Performed: ESOPHAGOGASTRODUODENOSCOPY (EGD) BIOPSY     Patient location during evaluation: PACU Anesthesia Type: MAC Level of consciousness: awake and alert and oriented Pain management: pain level controlled Vital Signs Assessment: post-procedure vital signs reviewed and stable Respiratory status: nonlabored ventilation, respiratory function stable and spontaneous breathing Cardiovascular status: stable and blood pressure returned to baseline Postop Assessment: no apparent nausea or vomiting Anesthetic complications: no   No notable events documented.  Last Vitals:  Vitals:   04/28/22 1011 04/28/22 1015  BP: (!) 117/48 (!) 127/52  Pulse: 78 82  Resp: (!) 21 17  Temp: 36.9 C   SpO2: 97% 97%    Last Pain:  Vitals:   04/28/22 0936  TempSrc: Temporal  PainSc: 0-No pain                 Tereza Gilham A.

## 2022-04-28 NOTE — Progress Notes (Signed)
Daily Progress Note  Hospital Day: 2  Chief Complaint: anemia, black stool   Assessment   Brief Narrative:  Jessica Torres is a 82 y.o. female with a pmh not limited to peripheral artery disease, hypertension, diabetes, GERD,  adenomatous colon polyps, and history of stroke on plavix, PVD. We saw her 2/19 for evaluation of anemia.   # 82 yo female with severe anemia / melena on plavix. Hgb 6.8, down from baseline of 12. Hgb improved from 6.8 to 9.5 post transfusion. No bms/ GI bleeding this am.   #Mild troponin elevation. Cardiology reportedly felt this was secondary to demand ischemia in setting of severe anemia.    Plan   Proceed with planned EGD today.Procedure was already explained. She  has no remaining questions Continue PPI infusion   Subjective   Some minor left sided abdominal burning this am. Otherwise no complaints. Getting bedside echo right now  Objective  Endoscopic studies:  Colonoscopy in September 2022 by Dr. Abbey Chatters for iron deficiency anemia.  - 4 small polyps in the transverse colon, left-sided diverticulosis and internal hemorrhoids. Path - tubular adenomas  Imaging:  DG Chest Port 1 View  Result Date: 04/27/2022 CLINICAL DATA:  Chest pain. EXAM: PORTABLE CHEST 1 VIEW COMPARISON:  10/20/2016. FINDINGS: The heart size and mediastinal contours are within normal limits. There is atherosclerotic calcification of the aorta. Interstitial prominence is noted bilaterally. There are small bilateral pleural effusions. No pneumothorax. No acute osseous abnormality. IMPRESSION: 1. Interstitial prominence bilaterally, possible edema or infiltrate. 2. Small bilateral pleural effusions. Electronically Signed   By: Brett Fairy M.D.   On: 04/27/2022 02:20    Lab Results: Recent Labs    04/27/22 0155 04/28/22 0216  WBC 6.4 7.2  HGB 6.8* 9.5*  HCT 20.5* 27.5*  PLT 259 209   BMET Recent Labs    04/27/22 0155 04/28/22 0216  NA 134* 135  K 4.5 3.5  CL 105  104  CO2 19* 25  GLUCOSE 341* 83  BUN 20 15  CREATININE 1.37* 1.44*  CALCIUM 9.3 8.8*   LFT No results for input(s): "PROT", "ALBUMIN", "AST", "ALT", "ALKPHOS", "BILITOT", "BILIDIR", "IBILI" in the last 72 hours. PT/INR Recent Labs    04/27/22 0800  LABPROT 15.9*  INR 1.3*     Scheduled inpatient medications:   sodium chloride   Intravenous Once   ezetimibe  10 mg Oral Daily   fenofibrate  160 mg Oral Daily   guaiFENesin  600 mg Oral BID   insulin aspart  0-15 Units Subcutaneous Q6H   levothyroxine  88 mcg Oral Q0600   sodium chloride flush  3 mL Intravenous Q12H   Continuous inpatient infusions:   sodium chloride 0 mL/hr at 04/28/22 0640   pantoprazole 8 mg/hr (04/28/22 0641)   PRN inpatient medications: acetaminophen **OR** acetaminophen, albuterol, lip balm, ondansetron **OR** ondansetron (ZOFRAN) IV  Vital signs in last 24 hours: Temp:  [97.7 F (36.5 C)-98.8 F (37.1 C)] 98 F (36.7 C) (02/19 0753) Pulse Rate:  [71-89] 86 (02/19 0753) Resp:  [12-18] 16 (02/19 0753) BP: (100-144)/(41-81) 133/48 (02/19 0753) SpO2:  [94 %-100 %] 96 % (02/19 0753) Weight:  [63.2 kg] 63.2 kg (02/19 0507) Last BM Date : 04/27/22  Intake/Output Summary (Last 24 hours) at 04/28/2022 0900 Last data filed at 04/28/2022 0641 Gross per 24 hour  Intake 1212.26 ml  Output 1650 ml  Net -437.74 ml    Intake/Output from previous day: 02/18 0701 - 02/19 0700  In: 1212.3 [I.V.:223.5; Blood:988.8] Out: 1650 [Urine:1650] Intake/Output this shift: No intake/output data recorded.   Physical Exam:  General: Alert female in NAD Heart:  Regular rate and rhythm.  Pulmonary: Normal respiratory effort Abdomen: Soft, nondistended, nontender. Normal bowel sounds. Extremities: No lower extremity edema  Neurologic: Alert and oriented Psych: Pleasant. Cooperative.    Principal Problem:   Acute blood loss anemia Active Problems:   Hypertension associated with diabetes ([place])    Hyperlipidemia-statin intolerant   DM (diabetes mellitus) ([place])   Peripheral arterial disease - Bilateral SFA    Hypothyroidism   NSTEMI (non-ST elevated myocardial infarction) ([place])   GI bleed   Heart failure with preserved ejection fraction ([facility])   Cough   History of CVA (cerebrovascular accident)   Chronic kidney disease, stage III (moderate) ([place])     LOS: 1 day   Tye Savoy ,NP 04/28/2022, 9:00 AM

## 2022-04-29 DIAGNOSIS — D62 Acute posthemorrhagic anemia: Secondary | ICD-10-CM | POA: Diagnosis not present

## 2022-04-29 LAB — CBC
HCT: 27.6 % — ABNORMAL LOW (ref 36.0–46.0)
Hemoglobin: 9.3 g/dL — ABNORMAL LOW (ref 12.0–15.0)
MCH: 29.6 pg (ref 26.0–34.0)
MCHC: 33.7 g/dL (ref 30.0–36.0)
MCV: 87.9 fL (ref 80.0–100.0)
Platelets: 215 10*3/uL (ref 150–400)
RBC: 3.14 MIL/uL — ABNORMAL LOW (ref 3.87–5.11)
RDW: 14.9 % (ref 11.5–15.5)
WBC: 8.1 10*3/uL (ref 4.0–10.5)
nRBC: 0 % (ref 0.0–0.2)

## 2022-04-29 LAB — GLUCOSE, CAPILLARY
Glucose-Capillary: 180 mg/dL — ABNORMAL HIGH (ref 70–99)
Glucose-Capillary: 260 mg/dL — ABNORMAL HIGH (ref 70–99)
Glucose-Capillary: 262 mg/dL — ABNORMAL HIGH (ref 70–99)
Glucose-Capillary: 293 mg/dL — ABNORMAL HIGH (ref 70–99)

## 2022-04-29 LAB — SURGICAL PATHOLOGY

## 2022-04-29 MED ORDER — SODIUM CHLORIDE 0.9 % IV SOLN: INTRAVENOUS | Status: DC

## 2022-04-29 NOTE — Progress Notes (Signed)
[facility] PROGRESS NOTE  LE INGS (DOB: June 28, 1940) [id number] PCP: Sharion Balloon, FNP Outpatient Specialists: Cardiology Dr. Gwenlyn Found  Brief Narrative: Jessica Torres is a 82 y.o. female with a history of stroke, PAD s/p SFA DES on plavix, T2DM, HTN, HLD, hypothyroidism who presented to the ED on 04/27/2022 with chest/upper abdominal pain for which she was also taking full dose aspirin and dark stools. Hemoglobin was found to be 6.8g/dl from a baseline of 12g/dl. +FOBT. Troponin elevated as well (357) thought to be secondary to symptomatic anemia. She was admitted, plavix held, PPI gtt started, given 3u PRBCs with improvement in hemoglobin to 9.5g/dl with no subsequent gross bleeding noted. EGD 2/19 revealed erythematous antral mucosa suspected to be gastritis and/or GAVE as well as gastric polyps. Biopsies taken and patient scheduled for EGD after 5 day plavix washout with intention for APC therapy.   Subjective: Having some burning abdominal pain. Had small BM without melena or red blood noted. Has no recollection of anyone discussing results with her yesterday. Sister at bedside this morning.  Objective: BP (!) 120/45 (BP Location: Left Arm)   Pulse 81   Temp 98.2 F (36.8 C) (Oral)   Resp 18   Ht 5' 5"$  (1.651 m)   Wt 63.9 kg   SpO2 97%   BMI 23.44 kg/m   Gen: Elderly frail female in no distress Pulm: Clear, nonlabored  CV: RRR, tight GI: Soft, NT, ND, +BS  Neuro: Alert and oriented. No new focal deficits. Ext: Warm, no deformities. Skin: No rashes, lesions or ulcers on visualized skin   EGD 2/19 Dr. Tarri Glenn:  Impression:       - Normal esophagus.                           - Erythematous mucosa in the antrum. 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 Biopsied.                           - A few gastric polyps. Biopsied.                           - Normal examined duodenum. Biopsied.                           - The examination was  otherwise normal. Recommendation: - Return patient to hospital ward for ongoing care.                           - Advance diet as tolerated today.                           - Continue present medications.                           - Continue Protonix 40 mg BID.                           - Await pathology results.                           -  Repeat upper endoscopy after a full 5 day Plavix                            wash-out for APC therapy for suspected GAVE if                            biopsies exclude a concurrent etiology.  Assessment & Plan: Acute symptomatic blood loss anemia due to GI bleeding: s/p 3u PRBCs 2/18. - Continue monitoring CBC in AM, hgb up as anticipated with transfusions, symptoms resolved, and hgb stable at 9.3g/dl today.  Gastritis, GAVE: High degree of suspicion that this has caused significant anemia on plavix. Bleeding can reasonably be expected to recur with reinitiation of antiplatelet agents.  - D/w GI Dr. Tarri Glenn, given her last dose of plavix 2/16, we will plan (pending biopsy results) to pursue EGD 2/21 at which time APC can be performed for GAVE treatment, after which plavix can be restarted if hgb stable.  - Continue PPI BID per GI. - Follow up pathology.  Type II NSTEMI: No WMA on echo, chest pain resolved, was more epigastric. - Keep on telemetry - Unable to give antiplatelet or anticoagulation at this time.    History of stroke, PAD s/p SFA DES placement 2018 by Dr. Gwenlyn Found:  - Pt has strong indications for antiplatelet therapy, will aim to restart once cleared by GI to do so.   Elevated BNP: Level is 499. CXR with interstitial prominence, small effusions. Last echo 123456, normal diastolic function ([id number]). Echo updated 2/19 shows preserved biventricular function.   - Given IV lasix with transfusions, appear euvolemic at this time.   IDT2DM: HbA1c 6.6%. - Continue SSI AC/HS. Usually takes 52u daily toujeo and 20-22u fast acting but has required much  less than that here. - Hold oral agents, pt not taking ozempic.  Murmur: High pitched systolic murmur stable with no valvular pathology by echo.   HTN:  - BP relatively soft, 109/43, so will continue to hold home losartan, HCTZ  Stage IIIb CKD:  - Avoid nephrotoxins  HLD: Statin intolerance noted - Continue zetia.  Hypothyroidism: TSH 3.412 - Continue synthroid  Patrecia Pour, MD [facility] [email or web address] 04/29/2022, 10:30 AM

## 2022-04-29 NOTE — H&P (View-Only) (Signed)
Daily Progress Note  Hospital Day: 3  Chief Complaint: anemia, black stool   Assessment   Brief Narrative:  Jessica Torres is a 82 y.o. female with a pmh not limited to peripheral artery disease, hypertension, diabetes, GERD, adenomatous colon polyps, and history of stroke on plavix, PVD. We saw her 2/19 for evaluation of anemia.   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 Hgb 9.3 up from 6.8 post transfusion.   #Type II NSTEMI.    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 She tells me that she has not had plavix since about 3 days prior to this admission on 2/18.Timing of EGD to be determined.   Continue Pantoprazole 40 mg BID   Subjective  Feels okay. Had small brown BM this am.    Objective  Endoscopic studies:   EGD 04/28/22   Imaging:  ECHOCARDIOGRAM COMPLETE  Result Date: 04/28/2022    ECHOCARDIOGRAM REPORT   Patient Name:   Jessica Torres Date of Exam: 04/28/2022 Medical Rec #:  [id number]      Height:       65.0 in Accession #:    [id number]     Weight:       139.3 lb Date of Birth:  28-Sep-1940      BSA:          1.696 m Patient Age:    18 years       BP:           133/48 mmHg Patient Gender: F              HR:           84 bpm. Exam Location:  Inpatient Procedure: 2D Echo, Cardiac Doppler, Color Doppler and Strain Analysis Indications:    Chest Pain. Elevated troponins.  History:        Patient has prior history of Echocardiogram examinations, most                 recent 08/25/2014. Stroke; Risk Factors:Hypertension and                 Diabetes.  Sonographer:    Philipp Deputy RDCS Referring Phys: [id number] [place]  1. Left ventricular ejection fraction, by estimation, is 60 to 65%. The left ventricle has normal function. The left ventricle has no regional wall motion abnormalities. There is mild concentric left ventricular  hypertrophy. Left ventricular diastolic parameters are consistent with Grade I diastolic dysfunction (impaired relaxation).  2. Right ventricular systolic function is normal. The right ventricular size is normal. Tricuspid regurgitation signal is inadequate for assessing PA pressure.  3. The mitral valve is normal in structure. Trivial mitral valve regurgitation. No evidence of mitral stenosis.  4. The aortic valve is tricuspid. Aortic valve regurgitation is not visualized. No aortic stenosis is present.  5. The inferior vena cava is normal in size with greater than 50% respiratory variability, suggesting right atrial pressure of 3 mmHg. FINDINGS  Left Ventricle: Left ventricular ejection fraction, by estimation, is 60 to 65%. The left ventricle has normal function. The left ventricle has no regional wall motion abnormalities. The left ventricular internal cavity size was normal in size. There is  mild concentric left ventricular hypertrophy. Left ventricular diastolic parameters are consistent with Grade I diastolic dysfunction (impaired relaxation). Right  Ventricle: The right ventricular size is normal. No increase in right ventricular wall thickness. Right ventricular systolic function is normal. Tricuspid regurgitation signal is inadequate for assessing PA pressure. Left Atrium: Left atrial size was normal in size. Right Atrium: Right atrial size was normal in size. Pericardium: Trivial pericardial effusion is present. Mitral Valve: The mitral valve is normal in structure. Trivial mitral valve regurgitation. No evidence of mitral valve stenosis. Tricuspid Valve: The tricuspid valve is normal in structure. Tricuspid valve regurgitation is not demonstrated. Aortic Valve: The aortic valve is tricuspid. Aortic valve regurgitation is not visualized. No aortic stenosis is present. Pulmonic Valve: The pulmonic valve was normal in structure. Pulmonic valve regurgitation is not visualized. Aorta: The aortic root is  normal in size and structure. Venous: The inferior vena cava is normal in size with greater than 50% respiratory variability, suggesting right atrial pressure of 3 mmHg. IAS/Shunts: No atrial level shunt detected by color flow Doppler.  LEFT VENTRICLE PLAX 2D LVIDd:         4.10 cm   Diastology LVIDs:         2.30 cm   LV e' medial:    5.66 cm/s LV PW:         1.20 cm   LV E/e' medial:  17.0 LV IVS:        1.20 cm   LV e' lateral:   8.49 cm/s LVOT diam:     1.60 cm   LV E/e' lateral: 11.3 LV SV:         65 LV SV Index:   39 LVOT Area:     2.01 cm  RIGHT VENTRICLE             IVC RV Basal diam:  2.10 cm     IVC diam: 1.60 cm RV S prime:     11.40 cm/s TAPSE (M-mode): 2.1 cm LEFT ATRIUM             Index        RIGHT ATRIUM           Index LA diam:        3.30 cm 1.95 cm/m   RA Area:     12.90 cm LA Vol (A2C):   29.6 ml 17.45 ml/m  RA Volume:   30.80 ml  18.16 ml/m LA Vol (A4C):   32.8 ml 19.33 ml/m LA Biplane Vol: 31.1 ml 18.33 ml/m  AORTIC VALVE LVOT Vmax:   135.00 cm/s LVOT Vmean:  103.000 cm/s LVOT VTI:    0.325 m  AORTA Ao Root diam: 2.70 cm Ao Asc diam:  2.40 cm Ao Desc diam: 1.30 cm MITRAL VALVE MV Area (PHT): 4.39 cm     SHUNTS MV Decel Time: 173 msec     Systemic VTI:  0.32 m MV E velocity: 96.00 cm/s   Systemic Diam: 1.60 cm MV A velocity: 106.00 cm/s MV E/A ratio:  0.91 Dalton McleanMD Electronically signed by Franki Monte Signature Date/Time: 04/28/2022/3:32:18 PM    Final    DG Chest Port 1 View  Result Date: 04/27/2022 CLINICAL DATA:  Chest pain. EXAM: PORTABLE CHEST 1 VIEW COMPARISON:  10/20/2016. FINDINGS: The heart size and mediastinal contours are within normal limits. There is atherosclerotic calcification of the aorta. Interstitial prominence is noted bilaterally. There are small bilateral pleural effusions. No pneumothorax. No acute osseous abnormality. IMPRESSION: 1. Interstitial prominence bilaterally, possible edema or infiltrate. 2. Small bilateral pleural effusions.  Electronically Signed   By: Mickel Baas  Lovena Le M.D.   On: 04/27/2022 02:20    Lab Results: Recent Labs    04/27/22 0155 04/28/22 0216 04/29/22 0213  WBC 6.4 7.2 8.1  HGB 6.8* 9.5* 9.3*  HCT 20.5* 27.5* 27.6*  PLT 259 209 215   BMET Recent Labs    04/27/22 0155 04/28/22 0216  NA 134* 135  K 4.5 3.5  CL 105 104  CO2 19* 25  GLUCOSE 341* 83  BUN 20 15  CREATININE 1.37* 1.44*  CALCIUM 9.3 8.8*   LFT No results for input(s): "PROT", "ALBUMIN", "AST", "ALT", "ALKPHOS", "BILITOT", "BILIDIR", "IBILI" in the last 72 hours. PT/INR Recent Labs    04/27/22 0800  LABPROT 15.9*  INR 1.3*     Scheduled inpatient medications:   sodium chloride   Intravenous Once   ezetimibe  10 mg Oral Daily   feeding supplement  237 mL Oral BID WC   fenofibrate  160 mg Oral Daily   guaiFENesin  600 mg Oral BID   insulin aspart  0-15 Units Subcutaneous TID WC   levothyroxine  88 mcg Oral Q0600   multivitamin with minerals  1 tablet Oral Daily   pantoprazole  40 mg Oral BID   sodium chloride flush  3 mL Intravenous Q12H   Continuous inpatient infusions:  PRN inpatient medications: acetaminophen **OR** acetaminophen, albuterol, lip balm, ondansetron **OR** ondansetron (ZOFRAN) IV  Vital signs in last 24 hours: Temp:  [98.1 F (36.7 C)-98.5 F (36.9 C)] 98.2 F (36.8 C) (02/20 0836) Pulse Rate:  [80-100] 81 (02/20 0836) Resp:  [17-18] 18 (02/20 0836) BP: (100-154)/(43-80) 120/45 (02/20 0836) SpO2:  [91 %-100 %] 97 % (02/20 0836) Weight:  [63.9 kg] 63.9 kg (02/20 0348) Last BM Date : 04/29/22  Intake/Output Summary (Last 24 hours) at 04/29/2022 1043 Last data filed at 04/29/2022 1000 Gross per 24 hour  Intake 240 ml  Output --  Net 240 ml    Intake/Output from previous day: No intake/output data recorded. Intake/Output this shift: Total I/O In: 240 [P.O.:240] Out: -    Physical Exam:  General: Alert female in NAD Heart:  Regular rate and rhythm.  Pulmonary: Normal respiratory  effort Abdomen: Soft, nondistended, nontender. Normal bowel sounds. Neurologic: Alert and oriented Psych: Pleasant. Cooperative.    Principal Problem:   Acute blood loss anemia Active Problems:   Hypertension associated with diabetes ([place])   Hyperlipidemia-statin intolerant   DM (diabetes mellitus) ([place])   Peripheral arterial disease - Bilateral SFA    Hypothyroidism   NSTEMI (non-ST elevated myocardial infarction) (HCC)   GI bleed   Heart failure with preserved ejection fraction (HCC)   Cough   History of CVA (cerebrovascular accident)   Chronic kidney disease, stage III (moderate) (HCC)   Gastritis and gastroduodenitis     LOS: 2 days   Tye Savoy ,NP 04/29/2022, 10:43 AM

## 2022-04-29 NOTE — Progress Notes (Signed)
Mobility Specialist Progress Note:   04/29/22 1210  Mobility  Activity Ambulated independently in hallway  Level of Assistance Independent  Assistive Device None  Distance Ambulated (ft) 500 ft  Activity Response Tolerated well  Mobility Referral Yes  $Mobility charge 1 Mobility   Pt agreeable to mobility session. Required no physical assistance throughout. HR 80s throughout ambulation. No c/o dizziness/SOB/lightheadedness. Pt back in bed with all needs met.   Nelta Numbers Mobility Specialist Please contact via SecureChat or  Rehab office at [phone]

## 2022-04-29 NOTE — Progress Notes (Signed)
Daily Progress Note  Hospital Day: 3  Chief Complaint: anemia, black stool   Assessment   Brief Narrative:  Jessica Torres is a 82 y.o. female with a pmh not limited to peripheral artery disease, hypertension, diabetes, GERD, adenomatous colon polyps, and history of stroke on plavix, PVD. We saw her 2/19 for evaluation of anemia.   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 Hgb 9.3 up from 6.8 post transfusion.   #Type II NSTEMI.    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 She tells me that she has not had plavix since about 3 days prior to this admission on 2/18.Timing of EGD to be determined.   Continue Pantoprazole 40 mg BID   Subjective  Feels okay. Had small brown BM this am.    Objective  Endoscopic studies:   EGD 04/28/22   Imaging:  ECHOCARDIOGRAM COMPLETE  Result Date: 04/28/2022    ECHOCARDIOGRAM REPORT   Patient Name:   Jessica Torres Date of Exam: 04/28/2022 Medical Rec #:  [id number]      Height:       65.0 in Accession #:    [id number]     Weight:       139.3 lb Date of Birth:  Nov 11, 1940      BSA:          1.696 m Patient Age:    36 years       BP:           133/48 mmHg Patient Gender: F              HR:           84 bpm. Exam Location:  Inpatient Procedure: 2D Echo, Cardiac Doppler, Color Doppler and Strain Analysis Indications:    Chest Pain. Elevated troponins.  History:        Patient has prior history of Echocardiogram examinations, most                 recent 08/25/2014. Stroke; Risk Factors:Hypertension and                 Diabetes.  Sonographer:    Philipp Deputy RDCS Referring Phys: [id number] [place]  1. Left ventricular ejection fraction, by estimation, is 60 to 65%. The left ventricle has normal function. The left ventricle has no regional wall motion abnormalities. There is mild concentric left ventricular  hypertrophy. Left ventricular diastolic parameters are consistent with Grade I diastolic dysfunction (impaired relaxation).  2. Right ventricular systolic function is normal. The right ventricular size is normal. Tricuspid regurgitation signal is inadequate for assessing PA pressure.  3. The mitral valve is normal in structure. Trivial mitral valve regurgitation. No evidence of mitral stenosis.  4. The aortic valve is tricuspid. Aortic valve regurgitation is not visualized. No aortic stenosis is present.  5. The inferior vena cava is normal in size with greater than 50% respiratory variability, suggesting right atrial pressure of 3 mmHg. FINDINGS  Left Ventricle: Left ventricular ejection fraction, by estimation, is 60 to 65%. The left ventricle has normal function. The left ventricle has no regional wall motion abnormalities. The left ventricular internal cavity size was normal in size. There is  mild concentric left ventricular hypertrophy. Left ventricular diastolic parameters are consistent with Grade I diastolic dysfunction (impaired relaxation). Right  Ventricle: The right ventricular size is normal. No increase in right ventricular wall thickness. Right ventricular systolic function is normal. Tricuspid regurgitation signal is inadequate for assessing PA pressure. Left Atrium: Left atrial size was normal in size. Right Atrium: Right atrial size was normal in size. Pericardium: Trivial pericardial effusion is present. Mitral Valve: The mitral valve is normal in structure. Trivial mitral valve regurgitation. No evidence of mitral valve stenosis. Tricuspid Valve: The tricuspid valve is normal in structure. Tricuspid valve regurgitation is not demonstrated. Aortic Valve: The aortic valve is tricuspid. Aortic valve regurgitation is not visualized. No aortic stenosis is present. Pulmonic Valve: The pulmonic valve was normal in structure. Pulmonic valve regurgitation is not visualized. Aorta: The aortic root is  normal in size and structure. Venous: The inferior vena cava is normal in size with greater than 50% respiratory variability, suggesting right atrial pressure of 3 mmHg. IAS/Shunts: No atrial level shunt detected by color flow Doppler.  LEFT VENTRICLE PLAX 2D LVIDd:         4.10 cm   Diastology LVIDs:         2.30 cm   LV e' medial:    5.66 cm/s LV PW:         1.20 cm   LV E/e' medial:  17.0 LV IVS:        1.20 cm   LV e' lateral:   8.49 cm/s LVOT diam:     1.60 cm   LV E/e' lateral: 11.3 LV SV:         65 LV SV Index:   39 LVOT Area:     2.01 cm  RIGHT VENTRICLE             IVC RV Basal diam:  2.10 cm     IVC diam: 1.60 cm RV S prime:     11.40 cm/s TAPSE (M-mode): 2.1 cm LEFT ATRIUM             Index        RIGHT ATRIUM           Index LA diam:        3.30 cm 1.95 cm/m   RA Area:     12.90 cm LA Vol (A2C):   29.6 ml 17.45 ml/m  RA Volume:   30.80 ml  18.16 ml/m LA Vol (A4C):   32.8 ml 19.33 ml/m LA Biplane Vol: 31.1 ml 18.33 ml/m  AORTIC VALVE LVOT Vmax:   135.00 cm/s LVOT Vmean:  103.000 cm/s LVOT VTI:    0.325 m  AORTA Ao Root diam: 2.70 cm Ao Asc diam:  2.40 cm Ao Desc diam: 1.30 cm MITRAL VALVE MV Area (PHT): 4.39 cm     SHUNTS MV Decel Time: 173 msec     Systemic VTI:  0.32 m MV E velocity: 96.00 cm/s   Systemic Diam: 1.60 cm MV A velocity: 106.00 cm/s MV E/A ratio:  0.91 Dalton McleanMD Electronically signed by Franki Monte Signature Date/Time: 04/28/2022/3:32:18 PM    Final    DG Chest Port 1 View  Result Date: 04/27/2022 CLINICAL DATA:  Chest pain. EXAM: PORTABLE CHEST 1 VIEW COMPARISON:  10/20/2016. FINDINGS: The heart size and mediastinal contours are within normal limits. There is atherosclerotic calcification of the aorta. Interstitial prominence is noted bilaterally. There are small bilateral pleural effusions. No pneumothorax. No acute osseous abnormality. IMPRESSION: 1. Interstitial prominence bilaterally, possible edema or infiltrate. 2. Small bilateral pleural effusions.  Electronically Signed   By: Mickel Baas  Lovena Le M.D.   On: 04/27/2022 02:20    Lab Results: Recent Labs    04/27/22 0155 04/28/22 0216 04/29/22 0213  WBC 6.4 7.2 8.1  HGB 6.8* 9.5* 9.3*  HCT 20.5* 27.5* 27.6*  PLT 259 209 215   BMET Recent Labs    04/27/22 0155 04/28/22 0216  NA 134* 135  K 4.5 3.5  CL 105 104  CO2 19* 25  GLUCOSE 341* 83  BUN 20 15  CREATININE 1.37* 1.44*  CALCIUM 9.3 8.8*   LFT No results for input(s): "PROT", "ALBUMIN", "AST", "ALT", "ALKPHOS", "BILITOT", "BILIDIR", "IBILI" in the last 72 hours. PT/INR Recent Labs    04/27/22 0800  LABPROT 15.9*  INR 1.3*     Scheduled inpatient medications:   sodium chloride   Intravenous Once   ezetimibe  10 mg Oral Daily   feeding supplement  237 mL Oral BID WC   fenofibrate  160 mg Oral Daily   guaiFENesin  600 mg Oral BID   insulin aspart  0-15 Units Subcutaneous TID WC   levothyroxine  88 mcg Oral Q0600   multivitamin with minerals  1 tablet Oral Daily   pantoprazole  40 mg Oral BID   sodium chloride flush  3 mL Intravenous Q12H   Continuous inpatient infusions:  PRN inpatient medications: acetaminophen **OR** acetaminophen, albuterol, lip balm, ondansetron **OR** ondansetron (ZOFRAN) IV  Vital signs in last 24 hours: Temp:  [98.1 F (36.7 C)-98.5 F (36.9 C)] 98.2 F (36.8 C) (02/20 0836) Pulse Rate:  [80-100] 81 (02/20 0836) Resp:  [17-18] 18 (02/20 0836) BP: (100-154)/(43-80) 120/45 (02/20 0836) SpO2:  [91 %-100 %] 97 % (02/20 0836) Weight:  [63.9 kg] 63.9 kg (02/20 0348) Last BM Date : 04/29/22  Intake/Output Summary (Last 24 hours) at 04/29/2022 1043 Last data filed at 04/29/2022 1000 Gross per 24 hour  Intake 240 ml  Output --  Net 240 ml    Intake/Output from previous day: No intake/output data recorded. Intake/Output this shift: Total I/O In: 240 [P.O.:240] Out: -    Physical Exam:  General: Alert female in NAD Heart:  Regular rate and rhythm.  Pulmonary: Normal respiratory  effort Abdomen: Soft, nondistended, nontender. Normal bowel sounds. Neurologic: Alert and oriented Psych: Pleasant. Cooperative.    Principal Problem:   Acute blood loss anemia Active Problems:   Hypertension associated with diabetes ([place])   Hyperlipidemia-statin intolerant   DM (diabetes mellitus) ([place])   Peripheral arterial disease - Bilateral SFA    Hypothyroidism   NSTEMI (non-ST elevated myocardial infarction) (HCC)   GI bleed   Heart failure with preserved ejection fraction (HCC)   Cough   History of CVA (cerebrovascular accident)   Chronic kidney disease, stage III (moderate) (HCC)   Gastritis and gastroduodenitis     LOS: 2 days   Tye Savoy ,NP 04/29/2022, 10:43 AM

## 2022-04-30 ENCOUNTER — Inpatient Hospital Stay (HOSPITAL_COMMUNITY): Payer: Medicare HMO | Admitting: Anesthesiology

## 2022-04-30 ENCOUNTER — Encounter (HOSPITAL_COMMUNITY): Payer: Self-pay | Admitting: Internal Medicine

## 2022-04-30 ENCOUNTER — Encounter (HOSPITAL_COMMUNITY)
Admission: EM | Disposition: A | Discharge status: Home / Self Care | Payer: Self-pay | Source: Home / Self Care | Attending: Family Medicine

## 2022-04-30 DIAGNOSIS — I252 Old myocardial infarction: Secondary | ICD-10-CM

## 2022-04-30 DIAGNOSIS — D62 Acute posthemorrhagic anemia: Secondary | ICD-10-CM

## 2022-04-30 DIAGNOSIS — I2510 Atherosclerotic heart disease of native coronary artery without angina pectoris: Secondary | ICD-10-CM

## 2022-04-30 DIAGNOSIS — I10 Essential (primary) hypertension: Secondary | ICD-10-CM

## 2022-04-30 DIAGNOSIS — K922 Gastrointestinal hemorrhage, unspecified: Secondary | ICD-10-CM

## 2022-04-30 DIAGNOSIS — K31819 Angiodysplasia of stomach and duodenum without bleeding: Secondary | ICD-10-CM

## 2022-04-30 HISTORY — PX: ESOPHAGOGASTRODUODENOSCOPY (EGD) WITH PROPOFOL: SHX5813

## 2022-04-30 HISTORY — PX: HOT HEMOSTASIS: SHX5433

## 2022-04-30 LAB — BASIC METABOLIC PANEL
Anion gap: 8 (ref 5–15)
BUN: 18 mg/dL (ref 8–23)
CO2: 21 mmol/L — ABNORMAL LOW (ref 22–32)
Calcium: 9.5 mg/dL (ref 8.9–10.3)
Chloride: 104 mmol/L (ref 98–111)
Creatinine, Ser: 1.45 mg/dL — ABNORMAL HIGH (ref 0.44–1.00)
GFR, Estimated: 37 mL/min — ABNORMAL LOW (ref >60)
Glucose, Bld: 228 mg/dL — ABNORMAL HIGH (ref 70–99)
Potassium: 4.2 mmol/L (ref 3.5–5.1)
Sodium: 133 mmol/L — ABNORMAL LOW (ref 135–145)

## 2022-04-30 LAB — CBC
HCT: 29.2 % — ABNORMAL LOW (ref 36.0–46.0)
Hemoglobin: 9.4 g/dL — ABNORMAL LOW (ref 12.0–15.0)
MCH: 28.7 pg (ref 26.0–34.0)
MCHC: 32.2 g/dL (ref 30.0–36.0)
MCV: 89.3 fL (ref 80.0–100.0)
Platelets: 235 10*3/uL (ref 150–400)
RBC: 3.27 MIL/uL — ABNORMAL LOW (ref 3.87–5.11)
RDW: 14.6 % (ref 11.5–15.5)
WBC: 8 10*3/uL (ref 4.0–10.5)
nRBC: 0 % (ref 0.0–0.2)

## 2022-04-30 LAB — GLUCOSE, CAPILLARY
Glucose-Capillary: 186 mg/dL — ABNORMAL HIGH (ref 70–99)
Glucose-Capillary: 191 mg/dL — ABNORMAL HIGH (ref 70–99)
Glucose-Capillary: 193 mg/dL — ABNORMAL HIGH (ref 70–99)
Glucose-Capillary: 197 mg/dL — ABNORMAL HIGH (ref 70–99)

## 2022-04-30 SURGERY — ESOPHAGOGASTRODUODENOSCOPY (EGD) WITH PROPOFOL
Anesthesia: Monitor Anesthesia Care

## 2022-04-30 MED ORDER — PROPOFOL 10 MG/ML IV BOLUS
INTRAVENOUS | Status: DC | PRN
  Administered 2022-04-30: 10 mg via INTRAVENOUS

## 2022-04-30 MED ORDER — LIDOCAINE 2% (20 MG/ML) 5 ML SYRINGE
INTRAMUSCULAR | Status: DC | PRN
  Administered 2022-04-30: 40 mg via INTRAVENOUS

## 2022-04-30 MED ORDER — FERROUS SULFATE 325 (65 FE) MG PO TBEC: 325.0000 mg | DELAYED_RELEASE_TABLET | ORAL | 1 refills | Status: DC

## 2022-04-30 MED ORDER — PANTOPRAZOLE SODIUM 40 MG PO TBEC: 40.0000 mg | DELAYED_RELEASE_TABLET | Freq: Two times a day (BID) | ORAL | 1 refills | Status: DC

## 2022-04-30 MED ORDER — LACTATED RINGERS IV SOLN: INTRAVENOUS | Status: DC | PRN

## 2022-04-30 MED ORDER — PROPOFOL 500 MG/50ML IV EMUL
INTRAVENOUS | Status: DC | PRN
  Administered 2022-04-30: 100 ug/kg/min via INTRAVENOUS

## 2022-04-30 MED ORDER — ONDANSETRON HCL 4 MG/2ML IJ SOLN
INTRAMUSCULAR | Status: DC | PRN
  Administered 2022-04-30: 4 mg via INTRAVENOUS

## 2022-04-30 MED ORDER — PHENYLEPHRINE 80 MCG/ML (10ML) SYRINGE FOR IV PUSH (FOR BLOOD PRESSURE SUPPORT)
PREFILLED_SYRINGE | INTRAVENOUS | Status: DC | PRN
  Administered 2022-04-30 (×2): 80 ug via INTRAVENOUS
  Administered 2022-04-30: 160 ug via INTRAVENOUS

## 2022-04-30 MED ORDER — LACTATED RINGERS IV SOLN: Freq: Once | INTRAVENOUS | Status: AC

## 2022-04-30 SURGICAL SUPPLY — 15 items

## 2022-04-30 NOTE — Anesthesia Procedure Notes (Signed)
Procedure Name: MAC Date/Time: 04/30/2022 9:57 AM  Performed by: Mariea Clonts, CRNAPre-anesthesia Checklist: Patient identified, Emergency Drugs available, Suction available, Patient being monitored and Timeout performed Patient Re-evaluated:Patient Re-evaluated prior to induction Oxygen Delivery Method: Nasal cannula Preoxygenation: Pre-oxygenation with 100% oxygen

## 2022-04-30 NOTE — Progress Notes (Signed)
Mobility Specialist Progress Note:   04/30/22 0950  Mobility  Activity Ambulated independently in hallway  Level of Assistance Independent  Assistive Device None  Distance Ambulated (ft) 500 ft  Activity Response Tolerated well  Mobility Referral Yes  $Mobility charge 1 Mobility   Pt eager for mobility session. Required no physical assistance throughout. Pt asx during ambulation, back in bed with all needs met.   Nelta Numbers Mobility Specialist Please contact via SecureChat or  Rehab office at [phone]

## 2022-04-30 NOTE — Interval H&P Note (Signed)
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  The various methods of treatment have been discussed with the patient and family. After consideration of risks, benefits and other options for treatment, the patient has consented to  Procedure(s) with comments: ESOPHAGOGASTRODUODENOSCOPY (EGD) WITH PROPOFOL (N/A) - With APC as a surgical intervention.  The patient's history has been reviewed, patient examined, no change in status, stable for surgery.  I have reviewed the patient's chart and labs.  Questions were answered to the patient's satisfaction.     [place]

## 2022-04-30 NOTE — Anesthesia Preprocedure Evaluation (Addendum)
Anesthesia Evaluation  Patient identified by MRN, date of birth, ID band Patient awake    Reviewed: Allergy & Precautions, NPO status , Patient's Chart, lab work & pertinent test results  Airway Mallampati: III  TM Distance: >3 FB Neck ROM: Full    Dental  (+) Poor Dentition, Caps, Dental Advisory Given, Missing   Pulmonary shortness of breath, sleep apnea    Pulmonary exam normal breath sounds clear to auscultation       Cardiovascular hypertension, Pt. on medications + CAD, + Past MI and + Peripheral Vascular Disease   Rhythm:Regular Rate:Normal + Systolic murmurs Echo 0000000  1. Left ventricular ejection fraction, by estimation, is 60 to 65%. The left ventricle has normal function. The left ventricle has no regional wall motion abnormalities. There is mild concentric left ventricular hypertrophy. Left ventricular diastolic parameters are consistent with Grade I diastolic dysfunction (impaired relaxation).   2. Right ventricular systolic function is normal. The right ventricular size is normal. Tricuspid regurgitation signal is inadequate for assessing PA pressure.   3. The mitral valve is normal in structure. Trivial mitral valve regurgitation. No evidence of mitral stenosis.   4. The aortic valve is tricuspid. Aortic valve regurgitation is not visualized. No aortic stenosis is present.   5. The inferior vena cava is normal in size with greater than 50% respiratory variability, suggesting right atrial pressure of 3 mmHg.     Stent left femoral artery   Neuro/Psych  Neuromuscular disease CVA  negative psych ROS   GI/Hepatic Neg liver ROS,GERD  Medicated,,  Endo/Other  diabetes, Well Controlled, Type 2, Insulin Dependent, Oral Hypoglycemic AgentsHypothyroidism    Renal/GU Renal InsufficiencyRenal disease  negative genitourinary   Musculoskeletal  (+) Arthritis , Osteoarthritis,    Abdominal   Peds  Hematology  (+)  Blood dyscrasia, anemia Plavix therapy- last doe 2 days ago   Anesthesia Other Findings   Reproductive/Obstetrics                             Anesthesia Physical Anesthesia Plan  ASA: 3  Anesthesia Plan: MAC   Post-op Pain Management: Minimal or no pain anticipated   Induction: Intravenous  PONV Risk Score and Plan: 2 and Propofol infusion, TIVA, Treatment may vary due to age or medical condition and Ondansetron  Airway Management Planned: Natural Airway and Simple Face Mask  Additional Equipment: None  Intra-op Plan:   Post-operative Plan:   Informed Consent: I have reviewed the patients History and Physical, chart, labs and discussed the procedure including the risks, benefits and alternatives for the proposed anesthesia with the patient or authorized representative who has indicated his/her understanding and acceptance.     Dental advisory given  Plan Discussed with: CRNA  Anesthesia Plan Comments:        Anesthesia Quick Evaluation

## 2022-04-30 NOTE — Op Note (Addendum)
[facility] Patient Name: Jessica Torres Procedure Date : 04/30/2022 MRN: [id number] Attending MD: Thornton Park MD, MD, [id number] Date of Birth: 01-Apr-1940 CSN: [id number] Age: 82 Admit Type: Inpatient Procedure:                Upper GI endoscopy Indications:              Watermelon stomach (GAVE syndrome) diagnosed on                            recent EGD with associated GI blood loss anemia,                            she completed a Plavix washout to allow for APC                            treatment Providers:                Thornton Park MD, MD, Glori Bickers, RN, Tyrone Apple, Technician, Virgilio Belling. Beckner, CRNA Referring MD:              Medicines:                Monitored Anesthesia Care Complications:            No immediate complications. Estimated Blood Loss:     Estimated blood loss was minimal. Procedure:                Pre-Anesthesia Assessment:                           - Prior to the procedure, a History and Physical                            was performed, and patient medications and                            allergies were reviewed. The patient's tolerance of                            previous anesthesia was also reviewed. The risks                            and benefits of the procedure and the sedation                            options and risks were discussed with the patient.                            All questions were answered, and informed consent                            was obtained. Prior Anticoagulants: The patient has  taken Plavix (clopidogrel), last dose was 5 days                            prior to procedure. ASA Grade Assessment: III - A                            patient with severe systemic disease. After                            reviewing the risks and benefits, the patient was                            deemed in satisfactory condition to undergo the                             procedure.                           After obtaining informed consent, the endoscope was                            passed under direct vision. Throughout the                            procedure, the patient's blood pressure, pulse, and                            oxygen saturations were monitored continuously. The                            [id number] Olympus endoscope was introduced                            through the mouth, and advanced to the second part                            of duodenum. The upper GI endoscopy was                            accomplished without difficulty. The patient                            tolerated the procedure well. Unfortunately, images                            through Provation only captured during treatment                            and did not successfully capture the before and                            after photos due to a software malfunction. Scope In: Scope Out: Findings:      The esophagus was normal.      Multiple small sessile  polyps with no bleeding and no stigmata of recent       bleeding were found in the gastric fundus.      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       Coagulation for hemostasis using argon plasma at 1 liter/minute and 20       watts was successful. Estimated blood loss was minimal.      Mild mucosal changes characterized by ulceration were found in the       duodenal bulb and in the second portion of the duodenum. These were at       the site of recent duodenal biopsies. Impression:               - Normal esophagus.                           - Multiple gastric polyps. Recent biopsies                            confirmed fundic gland polyps.                           - Gastric antral vascular ectasia without bleeding.                            Treated with argon plasma coagulation  (APC).                           - Mucosal changes in the duodenum.                           - No specimens collected. Recommendation:           - Return patient to hospital ward for ongoing care.                           - Advance diet as tolerated.                           - Continue present medications including                            pantoprazole 40 mg BID.                           - Okay to resume Plavix.                           - Repeat upper endoscopy for additional APC                            treatment if iron deficiency recurs/progresses.                           - I have recommended follow-up with Dr. Abbey Chatters. I  sent him a message through MyChart to make him                            aware of this procedure and our recommendations.                           - Results were reviewed with the patient in the                            PACU. I gave her a copy of the procedure note.                           The inpatient GI team will move to stand-by. Please                            call the on-call gastroenterologist with any                            additional questions or concerns during this                            hospitalization. Procedure Code(s):        --- Professional ---                           [phone], Esophagogastroduodenoscopy, flexible,                            transoral; with control of bleeding, any method Diagnosis Code(s):        --- Professional ---                           K31.7, Polyp of stomach and duodenum                           K31.819, Angiodysplasia of stomach and duodenum                            without bleeding                           K31.89, Other diseases of stomach and duodenum CPT copyright 2022 [employer]. All rights reserved. The codes documented in this report are preliminary and upon coder review may  be revised to meet current compliance requirements. Thornton Park  MD, MD 04/30/2022 10:53:16 AM This report has been signed electronically. Number of Addenda: 0

## 2022-04-30 NOTE — Transfer of Care (Signed)
Immediate Anesthesia Transfer of Care Note  [name]  Procedure(s) Performed: ESOPHAGOGASTRODUODENOSCOPY (EGD) WITH PROPOFOL HOT HEMOSTASIS (ARGON PLASMA COAGULATION/BICAP)  Patient Location: PACU  Anesthesia Type:MAC  Level of Consciousness: awake, alert , and oriented  Airway & Oxygen Therapy: Patient Spontanous Breathing and Patient connected to nasal cannula oxygen  Post-op Assessment: Report given to RN and Post -op Vital signs reviewed and stable  Post vital signs: Reviewed and stable  Last Vitals:  Vitals Value Taken Time  BP 103/67 04/30/22 1030  Temp 36.9 C 04/30/22 1024  Pulse 78 04/30/22 1044  Resp 17 04/30/22 1044  SpO2 99 % 04/30/22 1044  Vitals shown include unvalidated device data.  Last Pain:  Vitals:   04/30/22 1024  TempSrc:   PainSc: 0-No pain         Complications: No notable events documented.

## 2022-04-30 NOTE — Discharge Summary (Signed)
Physician Discharge Summary  DELAINEE KISHEL [id number] DOB: 02-05-1941 DOA: 04/27/2022  PCP: Sharion Balloon, FNP  Admit date: 04/27/2022 Discharge date: 04/30/2022  Time spent: 40 minutes  Recommendations for Outpatient Follow-up:  Follow outpatient CBC/CMP  Follow on iron, follow anemia outpatient Follow with Dr. Abbey Chatters outpatient, if iron deficiency recurs or progresses, may need repeat upper endoscopy Continue plavix for now Follow with cardiology outpatient  Follow blood sugars outpatient, not receiving much insulin here, instructed to use less insulin at home as she transitions back home until BG's more normal  Discharge Diagnoses:  Principal Problem:   Acute blood loss anemia Active Problems:   GI bleed   NSTEMI (non-ST elevated myocardial infarction) ([place])   Heart failure with preserved ejection fraction (HCC)   DM (diabetes mellitus) ([place])   Cough   Hypertension associated with diabetes ([place])   Chronic kidney disease, stage III (moderate) ([place])   Peripheral arterial disease - Bilateral SFA    History of CVA (cerebrovascular accident)   Hyperlipidemia-statin intolerant   Hypothyroidism   Gastritis and gastroduodenitis   GAVE (gastric antral vascular ectasia)   Discharge Condition: stable  Diet recommendation: heart healthy, diabetic   Filed Weights   04/29/22 0348 04/29/22 2353 04/30/22 0400  Weight: 63.9 kg 64.2 kg 64.2 kg    History of present illness:  Jessica Torres is Jessica Torres 82 y.o. female with Rosalynd Torres history of stroke, PAD s/p SFA DES on plavix, T2DM, HTN, HLD, hypothyroidism who presented to the ED on 04/27/2022 with chest/upper abdominal pain for which she was also taking full dose aspirin and dark stools. Hemoglobin was found to be 6.8g/dl from Brian Zeitlin baseline of 12g/dl. +FOBT. Troponin elevated as well (357) thought to be secondary to symptomatic anemia. She was admitted, plavix held, PPI gtt started, given 3u PRBCs with improvement in hemoglobin to 9.5g/dl with no  subsequent gross bleeding noted. EGD 2/19 revealed erythematous antral mucosa suspected to be gastritis and/or GAVE as well as gastric polyps. Biopsies taken and patient now s/p repeat  EGD after 5 day plavix washout with APC.  See below for additional details   Hospital Course:  Assessment and Plan:  Acute symptomatic blood loss anemia due to GI bleeding: s/p 3u PRBCs 2/18. - Hb stable at discharge - will send home with iron supplements   Gastritis, GAVE: High degree of suspicion that this has caused significant anemia on plavix. Bleeding can reasonably be expected to recur with reinitiation of antiplatelet agents.  - D/w GI Dr. Tarri Glenn, given her last dose of plavix 2/16, we will plan (pending biopsy results) to pursue EGD 2/21 at which time APC can be performed for GAVE treatment, after which plavix can be restarted if hgb stable.  - now s/p APC of gastric antral vascular ectasia without bleeding (2/21) - ok to resume plavix per GI, needs GI follow up outpatient - continue PPI BID - Continue PPI BID per GI. - Follow up pathology -> reactive gastropathy, fundic gland polyps   Type II NSTEMI: No WMA on echo, chest pain resolved, was more epigastric reportedly. - Keep on telemetry - Plavix restarted.  Statin intolerance.  Cardiology referral outpatient.     History of stroke, PAD s/p SFA DES placement 2018 by Dr. Gwenlyn Found:  - plavix resumed at discharge - follow with cardiology outpatient (she no longer sees Dr. Gwenlyn Found)    Elevated BNP: Level is 499. CXR with interstitial prominence, small effusions. Last echo 123456, normal diastolic function ([id number]). Echo updated 2/19 shows  preserved biventricular function.   - Given IV lasix with transfusions, appear euvolemic at this time.  - No plan for standing outpatient diuretic   IDT2DM: HbA1c 6.6%. - Continue SSI AC/HS. Usually takes 52u daily toujeo and 20-22u fast acting but has required much less than that here (on transition home, would cut  this in half for Jessica Torres little bit - resume home regimen once things appear consistently stable). - Hold oral agents, pt not taking ozempic.   Murmur: High pitched systolic murmur stable with no valvular pathology by echo.    HTN:  - hold losartan, HCTZ at discharge - follow with PCP to consider resumption   Stage IIIb CKD:  - Avoid nephrotoxins   HLD: Statin intolerance noted - Continue zetia.   Hypothyroidism: TSH 3.412 - Continue synthroid      Procedures: Echo IMPRESSIONS     1. Left ventricular ejection fraction, by estimation, is 60 to 65%. The  left ventricle has normal function. The left ventricle has no regional  wall motion abnormalities. There is mild concentric left ventricular  hypertrophy. Left ventricular diastolic  parameters are consistent with Grade I diastolic dysfunction (impaired  relaxation).   2. Right ventricular systolic function is normal. The right ventricular  size is normal. Tricuspid regurgitation signal is inadequate for assessing  PA pressure.   3. The mitral valve is normal in structure. Trivial mitral valve  regurgitation. No evidence of mitral stenosis.   4. The aortic valve is tricuspid. Aortic valve regurgitation is not  visualized. No aortic stenosis is present.   5. The inferior vena cava is normal in size with greater than 50%  respiratory variability, suggesting right atrial pressure of 3 mmHg.    Consultations: GI  Discharge Exam: Vitals:   04/30/22 1045 04/30/22 1115  BP: (!) 118/44 (!) 127/45  Pulse: 77 73  Resp: 11 13  Temp: 98.2 F (36.8 C) 97.9 F (36.6 C)  SpO2: 98% 99%   No complaints  General: No acute distress. Cardiovascular: Heart sounds show Kreg Earhart regular rate, and rhythm Lungs: Clear to auscultation bilaterally  Abdomen: mild TTP after the procedure Neurological: Alert and oriented 3. Moves all extremities 4 with equal strength. Cranial nerves II through XII grossly intact. Extremities: No clubbing or  cyanosis. No edema.  Discharge Instructions   Discharge Instructions     Ambulatory referral to Cardiology   Complete by: As directed    Call MD for:  difficulty breathing, headache or visual disturbances   Complete by: As directed    Call MD for:  extreme fatigue   Complete by: As directed    Call MD for:  hives   Complete by: As directed    Call MD for:  persistant dizziness or light-headedness   Complete by: As directed    Call MD for:  persistant nausea and vomiting   Complete by: As directed    Call MD for:  redness, tenderness, or signs of infection (pain, swelling, redness, odor or green/yellow discharge around incision site)   Complete by: As directed    Call MD for:  severe uncontrolled pain   Complete by: As directed    Call MD for:  temperature >100.4   Complete by: As directed    Diet - low sodium heart healthy   Complete by: As directed    Discharge instructions   Complete by: As directed    You were seen for anemia.  You had an endoscopy which showed gastric polyps, gastric antral  vascular ectasia without bleeding.  You were treated with argon plasma coagulation.  You had biopsies that showed reactive gastropathy and fundic gland polyps.  You'll discharge on 40 mg protonix twice Kyreese Chio day.  You should follow up with Dr. Abbey Chatters as an outpatient with gastroenterology.  You had elevated heart enzymes.  We think this was due to demand in the setting of your anemia.  Follow up with cardiology as an outpatient.  Continue your plavix.  We'll send you with iron to take at home.  Watch for signs or symptoms of bleeding.  We've been holding your losartan-HCTZ combination since admission.  Your blood pressures are reasonable at this time.  Follow up outpatient with your PCP to see if this needs to be resumed.  As you return home, cut your insulin doses in half.  You haven't needed much insulin while you've been here (this maybe because of the modified diet we use in the  hospital.  Watch for low blood sugars.  You can resume your home insulin regimen gradually if your blood sugars are returning to their normal consistent levels you typically see at home.  Return for new, recurrent, or worsening symptoms.  Please ask your PCP to request records from this hospitalization so they know what was done and what the next steps will be.   Increase activity slowly   Complete by: As directed       Allergies as of 04/30/2022       Reactions   Morphine And Related Swelling, Other (See Comments)   Facial swelling   Latex Rash   Statins Other (See Comments)   Myalgias        Medication List     STOP taking these medications    losartan-hydrochlorothiazide 50-12.5 MG tablet Commonly known as: HYZAAR       TAKE these medications    Accu-Chek Softclix Lancets lancets Test BS TID Dx E11.9   B-D SINGLE USE SWABS REGULAR Pads Test BS 4 times daily Dx E11.9   BD Pen Needle Nano 2nd Gen 32G X 4 MM Misc Generic drug: Insulin Pen Needle Use twice daily with insulin Dx E11.9   cilostazol 50 MG tablet Commonly known as: PLETAL Take 1 tablet (50 mg total) by mouth 2 (two) times daily.   clopidogrel 75 MG tablet Commonly known as: PLAVIX TAKE 1 TABLET DAILY   empagliflozin 25 MG Tabs tablet Commonly known as: JARDIANCE Take 25 mg by mouth as needed (low blood sugar).   ezetimibe 10 MG tablet Commonly known as: ZETIA Take 1 tablet (10 mg total) by mouth daily.   fenofibrate 160 MG tablet TAKE 1 TABLET DAILY   ferrous sulfate 325 (65 FE) MG EC tablet Take 1 tablet (325 mg total) by mouth every Monday, Wednesday, and Friday.   FREESTYLE LITE test strip Generic drug: glucose blood Test BS TID Dx E11.9   insulin lispro 100 UNIT/ML KwikPen Commonly known as: HUMALOG Inject 10-20 Units into the skin 3 (three) times daily. What changed:  how much to take when to take this   levothyroxine 88 MCG tablet Commonly known as: SYNTHROID TAKE 1  TABLET DAILY BEFORE BREAKFAST   pantoprazole 40 MG tablet Commonly known as: PROTONIX Take 1 tablet (40 mg total) by mouth 2 (two) times daily. Follow up with gastroenterology for additional recommendations What changed:  when to take this additional instructions   Semaglutide(0.25 or 0.5MG/DOS) 2 MG/3ML Sopn Inject 0.25 mg into the skin once Jsoeph Podesta week.  Toujeo Max SoloStar 300 UNIT/ML Solostar Pen Generic drug: insulin glargine (2 Unit Dial) INJECT 52 UNITS UNDER THE SKIN DAILY What changed: See the new instructions.       Allergies  Allergen Reactions   Morphine And Related Swelling and Other (See Comments)    Facial swelling   Latex Rash   Statins Other (See Comments)    Myalgias       The results of significant diagnostics from this hospitalization (including imaging, microbiology, ancillary and laboratory) are listed below for reference.    Significant Diagnostic Studies: ECHOCARDIOGRAM COMPLETE  Result Date: 04/28/2022    ECHOCARDIOGRAM REPORT   Patient Name:   Jessica Torres Date of Exam: 04/28/2022 Medical Rec #:  [id number]      Height:       65.0 in Accession #:    [id number]     Weight:       139.3 lb Date of Birth:  [birth year]      BSA:          1.696 m Patient Age:    82 years       BP:           133/48 mmHg Patient Gender: F              HR:           84 bpm. Exam Location:  Inpatient Procedure: 2D Echo, Cardiac Doppler, Color Doppler and Strain Analysis Indications:    Chest Pain. Elevated troponins.  History:        Patient has prior history of Echocardiogram examinations, most                 recent 08/25/2014. Stroke; Risk Factors:Hypertension and                 Diabetes.  Sonographer:    Philipp Deputy RDCS Referring Phys: [id number] [place]  1. Left ventricular ejection fraction, by estimation, is 60 to 65%. The left ventricle has normal function. The left ventricle has no regional wall motion abnormalities. There is mild concentric left  ventricular hypertrophy. Left ventricular diastolic parameters are consistent with Grade I diastolic dysfunction (impaired relaxation).  2. Right ventricular systolic function is normal. The right ventricular size is normal. Tricuspid regurgitation signal is inadequate for assessing PA pressure.  3. The mitral valve is normal in structure. Trivial mitral valve regurgitation. No evidence of mitral stenosis.  4. The aortic valve is tricuspid. Aortic valve regurgitation is not visualized. No aortic stenosis is present.  5. The inferior vena cava is normal in size with greater than 50% respiratory variability, suggesting right atrial pressure of 3 mmHg. FINDINGS  Left Ventricle: Left ventricular ejection fraction, by estimation, is 60 to 65%. The left ventricle has normal function. The left ventricle has no regional wall motion abnormalities. The left ventricular internal cavity size was normal in size. There is  mild concentric left ventricular hypertrophy. Left ventricular diastolic parameters are consistent with Grade I diastolic dysfunction (impaired relaxation). Right Ventricle: The right ventricular size is normal. No increase in right ventricular wall thickness. Right ventricular systolic function is normal. Tricuspid regurgitation signal is inadequate for assessing PA pressure. Left Atrium: Left atrial size was normal in size. Right Atrium: Right atrial size was normal in size. Pericardium: Trivial pericardial effusion is present. Mitral Valve: The mitral valve is normal in structure. Trivial mitral valve regurgitation. No evidence of mitral valve stenosis. Tricuspid Valve: The tricuspid valve is normal in  structure. Tricuspid valve regurgitation is not demonstrated. Aortic Valve: The aortic valve is tricuspid. Aortic valve regurgitation is not visualized. No aortic stenosis is present. Pulmonic Valve: The pulmonic valve was normal in structure. Pulmonic valve regurgitation is not visualized. Aorta: The aortic  root is normal in size and structure. Venous: The inferior vena cava is normal in size with greater than 50% respiratory variability, suggesting right atrial pressure of 3 mmHg. IAS/Shunts: No atrial level shunt detected by color flow Doppler.  LEFT VENTRICLE PLAX 2D LVIDd:         4.10 cm   Diastology LVIDs:         2.30 cm   LV e' medial:    5.66 cm/s LV PW:         1.20 cm   LV E/e' medial:  17.0 LV IVS:        1.20 cm   LV e' lateral:   8.49 cm/s LVOT diam:     1.60 cm   LV E/e' lateral: 11.3 LV SV:         65 LV SV Index:   39 LVOT Area:     2.01 cm  RIGHT VENTRICLE             IVC RV Basal diam:  2.10 cm     IVC diam: 1.60 cm RV S prime:     11.40 cm/s TAPSE (M-mode): 2.1 cm LEFT ATRIUM             Index        RIGHT ATRIUM           Index LA diam:        3.30 cm 1.95 cm/m   RA Area:     12.90 cm LA Vol (A2C):   29.6 ml 17.45 ml/m  RA Volume:   30.80 ml  18.16 ml/m LA Vol (A4C):   32.8 ml 19.33 ml/m LA Biplane Vol: 31.1 ml 18.33 ml/m  AORTIC VALVE LVOT Vmax:   135.00 cm/s LVOT Vmean:  103.000 cm/s LVOT VTI:    0.325 m  AORTA Ao Root diam: 2.70 cm Ao Asc diam:  2.40 cm Ao Desc diam: 1.30 cm MITRAL VALVE MV Area (PHT): 4.39 cm     SHUNTS MV Decel Time: 173 msec     Systemic VTI:  0.32 m MV E velocity: 96.00 cm/s   Systemic Diam: 1.60 cm MV Dimarco Minkin velocity: 106.00 cm/s MV E/Geovonni Meyerhoff ratio:  0.91 Dalton McleanMD Electronically signed by Franki Monte Signature Date/Time: 04/28/2022/3:32:18 PM    Final    DG Chest Port 1 View  Result Date: 04/27/2022 CLINICAL DATA:  Chest pain. EXAM: PORTABLE CHEST 1 VIEW COMPARISON:  10/20/2016. FINDINGS: The heart size and mediastinal contours are within normal limits. There is atherosclerotic calcification of the aorta. Interstitial prominence is noted bilaterally. There are small bilateral pleural effusions. No pneumothorax. No acute osseous abnormality. IMPRESSION: 1. Interstitial prominence bilaterally, possible edema or infiltrate. 2. Small bilateral pleural effusions.  Electronically Signed   By: Brett Fairy M.D.   On: 04/27/2022 02:20    Microbiology: No results found for this or any previous visit (from the past 240 hour(s)).   Labs: Basic Metabolic Panel: Recent Labs  Lab 04/27/22 0155 04/28/22 0216 04/30/22 0346  NA 134* 135 133*  K 4.5 3.5 4.2  CL 105 104 104  CO2 19* 25 21*  GLUCOSE 341* 83 228*  BUN 20 15 18  $ CREATININE 1.37* 1.44* 1.45*  CALCIUM 9.3 8.8*  9.5   Liver Function Tests: No results for input(s): "AST", "ALT", "ALKPHOS", "BILITOT", "PROT", "ALBUMIN" in the last 168 hours. No results for input(s): "LIPASE", "AMYLASE" in the last 168 hours. No results for input(s): "AMMONIA" in the last 168 hours. CBC: Recent Labs  Lab 04/27/22 0155 04/28/22 0216 04/29/22 0213 04/30/22 0346  WBC 6.4 7.2 8.1 8.0  HGB 6.8* 9.5* 9.3* 9.4*  HCT 20.5* 27.5* 27.6* 29.2*  MCV 88.4 84.9 87.9 89.3  PLT 259 209 215 235   Cardiac Enzymes: No results for input(s): "CKTOTAL", "CKMB", "CKMBINDEX", "TROPONINI" in the last 168 hours. BNP: BNP (last 3 results) Recent Labs    04/27/22 0800  BNP 499.5*    ProBNP (last 3 results) No results for input(s): "PROBNP" in the last 8760 hours.  CBG: Recent Labs  Lab 04/29/22 2206 04/30/22 0811 04/30/22 0947 04/30/22 1026 04/30/22 1207  GLUCAP 293* 186* 191* 193* 197*       Signed:  Fayrene Helper MD.  [facility] 04/30/2022, 3:34 PM

## 2022-05-01 ENCOUNTER — Telehealth: Payer: Self-pay

## 2022-05-01 ENCOUNTER — Other Ambulatory Visit: Payer: Self-pay | Admitting: Family

## 2022-05-01 ENCOUNTER — Telehealth: Payer: Self-pay | Admitting: Family

## 2022-05-01 NOTE — Transitions of Care (Post Inpatient/ED Visit) (Signed)
   05/01/2022  Name: Jessica Torres MRN: [id number] DOB: 25-Sep-1940  Today's TOC FU Call Status: Today's TOC FU Call Status:: Unsuccessul Call (1st Attempt) Unsuccessful Call (1st Attempt) Date: 05/01/22  Attempted to reach the patient regarding the most recent Inpatient/ED visit.  Follow Up Plan: Additional outreach attempts will be made to reach the patient to complete the Transitions of Care (Post Inpatient/ED visit) call.   Signature Peter Garter RN, Jackquline Denmark, [facility] Management [phone]

## 2022-05-01 NOTE — Telephone Encounter (Signed)
Contacted Jessica Torres to schedule their annual wellness visit. Patient declined to schedule AWV at this time.  Patient explained she was seeing a new provider at [facility]  Thank you,  Colletta [place],  [place] ??[phone]

## 2022-05-01 NOTE — Anesthesia Postprocedure Evaluation (Signed)
Anesthesia Post Note  [name]  Procedure(s) Performed: ESOPHAGOGASTRODUODENOSCOPY (EGD) WITH PROPOFOL HOT HEMOSTASIS (ARGON PLASMA COAGULATION/BICAP)     Patient location during evaluation: PACU Anesthesia Type: MAC Level of consciousness: awake and alert Pain management: pain level controlled Vital Signs Assessment: post-procedure vital signs reviewed and stable Respiratory status: spontaneous breathing Cardiovascular status: stable Anesthetic complications: no   No notable events documented.  Last Vitals:  Vitals:   04/30/22 1045 04/30/22 1115  BP: (!) 118/44 (!) 127/45  Pulse: 77 73  Resp: 11 13  Temp: 36.8 C 36.6 C  SpO2: 98% 99%    Last Pain:  Vitals:   04/30/22 1115  TempSrc: Oral  PainSc:                  Nolon Nations

## 2022-05-04 ENCOUNTER — Encounter (HOSPITAL_COMMUNITY): Payer: Self-pay | Admitting: Gastroenterology

## 2022-05-06 ENCOUNTER — Ambulatory Visit: Payer: Self-pay

## 2022-05-06 ENCOUNTER — Ambulatory Visit: Payer: Self-pay | Admitting: *Deleted

## 2022-05-06 NOTE — Chronic Care Management (AMB) (Signed)
   05/06/2022  Jessica Torres 1940/07/11 [id number]   ERROR  please disregard

## 2022-05-06 NOTE — Chronic Care Management (AMB) (Signed)
   05/06/2022  APPHIA PLAGMAN [birth year] [id number]   Reason for Encounter: Change in CCM enrollment status   Horris Latino RN Care Manager/Chronic Care Management [phone]

## 2022-07-11 ENCOUNTER — Inpatient Hospital Stay (HOSPITAL_COMMUNITY)
Admission: EM | Admit: 2022-07-11 | Discharge: 2022-07-17 | DRG: 377 | Disposition: A | Discharge status: Home / Self Care | Payer: Medicare HMO | Attending: Family Medicine | Admitting: Family Medicine

## 2022-07-11 ENCOUNTER — Encounter (HOSPITAL_COMMUNITY): Payer: Self-pay | Admitting: Emergency Medicine

## 2022-07-11 DIAGNOSIS — R9431 Abnormal electrocardiogram [ECG] [EKG]: Secondary | ICD-10-CM | POA: Diagnosis not present

## 2022-07-11 DIAGNOSIS — I8510 Secondary esophageal varices without bleeding: Secondary | ICD-10-CM | POA: Diagnosis present

## 2022-07-11 DIAGNOSIS — K317 Polyp of stomach and duodenum: Secondary | ICD-10-CM | POA: Diagnosis not present

## 2022-07-11 DIAGNOSIS — Z833 Family history of diabetes mellitus: Secondary | ICD-10-CM

## 2022-07-11 DIAGNOSIS — K31811 Angiodysplasia of stomach and duodenum with bleeding: Secondary | ICD-10-CM | POA: Diagnosis present

## 2022-07-11 DIAGNOSIS — G4733 Obstructive sleep apnea (adult) (pediatric): Secondary | ICD-10-CM | POA: Diagnosis present

## 2022-07-11 DIAGNOSIS — D649 Anemia, unspecified: Secondary | ICD-10-CM

## 2022-07-11 DIAGNOSIS — E1169 Type 2 diabetes mellitus with other specified complication: Secondary | ICD-10-CM

## 2022-07-11 DIAGNOSIS — I252 Old myocardial infarction: Secondary | ICD-10-CM | POA: Diagnosis not present

## 2022-07-11 DIAGNOSIS — Z7982 Long term (current) use of aspirin: Secondary | ICD-10-CM | POA: Diagnosis not present

## 2022-07-11 DIAGNOSIS — Z8673 Personal history of transient ischemic attack (TIA), and cerebral infarction without residual deficits: Secondary | ICD-10-CM

## 2022-07-11 DIAGNOSIS — R079 Chest pain, unspecified: Secondary | ICD-10-CM | POA: Diagnosis not present

## 2022-07-11 DIAGNOSIS — K7460 Unspecified cirrhosis of liver: Secondary | ICD-10-CM | POA: Diagnosis present

## 2022-07-11 DIAGNOSIS — Z7984 Long term (current) use of oral hypoglycemic drugs: Secondary | ICD-10-CM

## 2022-07-11 DIAGNOSIS — D62 Acute posthemorrhagic anemia: Secondary | ICD-10-CM | POA: Diagnosis not present

## 2022-07-11 DIAGNOSIS — N1832 Chronic kidney disease, stage 3b: Secondary | ICD-10-CM | POA: Diagnosis present

## 2022-07-11 DIAGNOSIS — I5030 Unspecified diastolic (congestive) heart failure: Secondary | ICD-10-CM | POA: Diagnosis present

## 2022-07-11 DIAGNOSIS — K219 Gastro-esophageal reflux disease without esophagitis: Secondary | ICD-10-CM | POA: Diagnosis present

## 2022-07-11 DIAGNOSIS — I5022 Chronic systolic (congestive) heart failure: Secondary | ICD-10-CM | POA: Diagnosis not present

## 2022-07-11 DIAGNOSIS — D509 Iron deficiency anemia, unspecified: Secondary | ICD-10-CM | POA: Diagnosis not present

## 2022-07-11 DIAGNOSIS — E1151 Type 2 diabetes mellitus with diabetic peripheral angiopathy without gangrene: Secondary | ICD-10-CM | POA: Diagnosis present

## 2022-07-11 DIAGNOSIS — N183 Chronic kidney disease, stage 3 unspecified: Secondary | ICD-10-CM | POA: Diagnosis present

## 2022-07-11 DIAGNOSIS — I2510 Atherosclerotic heart disease of native coronary artery without angina pectoris: Secondary | ICD-10-CM | POA: Diagnosis present

## 2022-07-11 DIAGNOSIS — Z794 Long term (current) use of insulin: Secondary | ICD-10-CM

## 2022-07-11 DIAGNOSIS — E1122 Type 2 diabetes mellitus with diabetic chronic kidney disease: Secondary | ICD-10-CM | POA: Diagnosis present

## 2022-07-11 DIAGNOSIS — K921 Melena: Secondary | ICD-10-CM

## 2022-07-11 DIAGNOSIS — I739 Peripheral vascular disease, unspecified: Secondary | ICD-10-CM | POA: Diagnosis present

## 2022-07-11 DIAGNOSIS — K3189 Other diseases of stomach and duodenum: Secondary | ICD-10-CM | POA: Diagnosis not present

## 2022-07-11 DIAGNOSIS — Z8249 Family history of ischemic heart disease and other diseases of the circulatory system: Secondary | ICD-10-CM | POA: Diagnosis not present

## 2022-07-11 DIAGNOSIS — K766 Portal hypertension: Secondary | ICD-10-CM | POA: Diagnosis present

## 2022-07-11 DIAGNOSIS — D631 Anemia in chronic kidney disease: Secondary | ICD-10-CM | POA: Diagnosis present

## 2022-07-11 DIAGNOSIS — K7290 Hepatic failure, unspecified without coma: Secondary | ICD-10-CM

## 2022-07-11 DIAGNOSIS — I8500 Esophageal varices without bleeding: Secondary | ICD-10-CM | POA: Diagnosis not present

## 2022-07-11 DIAGNOSIS — E785 Hyperlipidemia, unspecified: Secondary | ICD-10-CM | POA: Diagnosis present

## 2022-07-11 DIAGNOSIS — I214 Non-ST elevation (NSTEMI) myocardial infarction: Secondary | ICD-10-CM | POA: Diagnosis not present

## 2022-07-11 DIAGNOSIS — Z8542 Personal history of malignant neoplasm of other parts of uterus: Secondary | ICD-10-CM

## 2022-07-11 DIAGNOSIS — Z885 Allergy status to narcotic agent status: Secondary | ICD-10-CM

## 2022-07-11 DIAGNOSIS — K922 Gastrointestinal hemorrhage, unspecified: Secondary | ICD-10-CM | POA: Diagnosis not present

## 2022-07-11 DIAGNOSIS — E039 Hypothyroidism, unspecified: Secondary | ICD-10-CM | POA: Diagnosis present

## 2022-07-11 DIAGNOSIS — E119 Type 2 diabetes mellitus without complications: Secondary | ICD-10-CM

## 2022-07-11 DIAGNOSIS — K2971 Gastritis, unspecified, with bleeding: Secondary | ICD-10-CM | POA: Diagnosis present

## 2022-07-11 DIAGNOSIS — K7689 Other specified diseases of liver: Secondary | ICD-10-CM | POA: Diagnosis not present

## 2022-07-11 DIAGNOSIS — K767 Hepatorenal syndrome: Secondary | ICD-10-CM | POA: Diagnosis not present

## 2022-07-11 DIAGNOSIS — K5900 Constipation, unspecified: Secondary | ICD-10-CM | POA: Diagnosis present

## 2022-07-11 DIAGNOSIS — R932 Abnormal findings on diagnostic imaging of liver and biliary tract: Secondary | ICD-10-CM | POA: Diagnosis not present

## 2022-07-11 DIAGNOSIS — I5042 Chronic combined systolic (congestive) and diastolic (congestive) heart failure: Secondary | ICD-10-CM | POA: Diagnosis present

## 2022-07-11 DIAGNOSIS — Z7902 Long term (current) use of antithrombotics/antiplatelets: Secondary | ICD-10-CM

## 2022-07-11 DIAGNOSIS — I2583 Coronary atherosclerosis due to lipid rich plaque: Secondary | ICD-10-CM | POA: Diagnosis present

## 2022-07-11 DIAGNOSIS — E1159 Type 2 diabetes mellitus with other circulatory complications: Secondary | ICD-10-CM | POA: Diagnosis present

## 2022-07-11 DIAGNOSIS — I10 Essential (primary) hypertension: Secondary | ICD-10-CM | POA: Diagnosis not present

## 2022-07-11 DIAGNOSIS — Z888 Allergy status to other drugs, medicaments and biological substances status: Secondary | ICD-10-CM | POA: Diagnosis not present

## 2022-07-11 DIAGNOSIS — Z91040 Latex allergy status: Secondary | ICD-10-CM

## 2022-07-11 DIAGNOSIS — I21A1 Myocardial infarction type 2: Secondary | ICD-10-CM | POA: Diagnosis present

## 2022-07-11 DIAGNOSIS — Z79899 Other long term (current) drug therapy: Secondary | ICD-10-CM

## 2022-07-11 DIAGNOSIS — R7989 Other specified abnormal findings of blood chemistry: Secondary | ICD-10-CM | POA: Diagnosis not present

## 2022-07-11 DIAGNOSIS — K31819 Angiodysplasia of stomach and duodenum without bleeding: Secondary | ICD-10-CM | POA: Diagnosis not present

## 2022-07-11 DIAGNOSIS — Z79890 Hormone replacement therapy (postmenopausal): Secondary | ICD-10-CM

## 2022-07-11 DIAGNOSIS — Z86010 Personal history of colonic polyps: Secondary | ICD-10-CM

## 2022-07-11 LAB — CBC WITH DIFFERENTIAL/PLATELET
Abs Immature Granulocytes: 0.03 10*3/uL (ref 0.00–0.07)
Basophils Absolute: 0.1 10*3/uL (ref 0.0–0.1)
Basophils Relative: 1 %
Eosinophils Absolute: 0.1 10*3/uL (ref 0.0–0.5)
Eosinophils Relative: 1 %
HCT: 19.2 % — ABNORMAL LOW (ref 36.0–46.0)
Hemoglobin: 6.1 g/dL — CL (ref 12.0–15.0)
Immature Granulocytes: 0 %
Lymphocytes Relative: 20 %
Lymphs Abs: 1.6 10*3/uL (ref 0.7–4.0)
MCH: 28.9 pg (ref 26.0–34.0)
MCHC: 31.8 g/dL (ref 30.0–36.0)
MCV: 91 fL (ref 80.0–100.0)
Monocytes Absolute: 1 10*3/uL (ref 0.1–1.0)
Monocytes Relative: 12 %
Neutro Abs: 5.3 10*3/uL (ref 1.7–7.7)
Neutrophils Relative %: 66 %
Platelets: 270 10*3/uL (ref 150–400)
RBC: 2.11 MIL/uL — ABNORMAL LOW (ref 3.87–5.11)
RDW: 15.9 % — ABNORMAL HIGH (ref 11.5–15.5)
WBC: 8.1 10*3/uL (ref 4.0–10.5)
nRBC: 0 % (ref 0.0–0.2)

## 2022-07-11 LAB — BPAM RBC
Blood Product Expiration Date: 202405312359
ISSUE DATE / TIME: 202405032117
Unit Type and Rh: 9500

## 2022-07-11 LAB — COMPREHENSIVE METABOLIC PANEL
ALT: 13 U/L (ref 0–44)
AST: 34 U/L (ref 15–41)
Albumin: 2.9 g/dL — ABNORMAL LOW (ref 3.5–5.0)
Alkaline Phosphatase: 56 U/L (ref 38–126)
Anion gap: 9 (ref 5–15)
BUN: 22 mg/dL (ref 8–23)
CO2: 19 mmol/L — ABNORMAL LOW (ref 22–32)
Calcium: 9.3 mg/dL (ref 8.9–10.3)
Chloride: 104 mmol/L (ref 98–111)
Creatinine, Ser: 1.4 mg/dL — ABNORMAL HIGH (ref 0.44–1.00)
GFR, Estimated: 38 mL/min — ABNORMAL LOW (ref >60)
Glucose, Bld: 191 mg/dL — ABNORMAL HIGH (ref 70–99)
Potassium: 4.1 mmol/L (ref 3.5–5.1)
Sodium: 132 mmol/L — ABNORMAL LOW (ref 135–145)
Total Bilirubin: 2.7 mg/dL — ABNORMAL HIGH (ref 0.3–1.2)
Total Protein: 6.7 g/dL (ref 6.5–8.1)

## 2022-07-11 LAB — TROPONIN I (HIGH SENSITIVITY)
Troponin I (High Sensitivity): 252 ng/L (ref <18)
Troponin I (High Sensitivity): 271 ng/L (ref <18)

## 2022-07-11 LAB — CBG MONITORING, ED: Glucose-Capillary: 173 mg/dL — ABNORMAL HIGH (ref 70–99)

## 2022-07-11 LAB — PROTIME-INR
INR: 1.4 — ABNORMAL HIGH (ref 0.8–1.2)
Prothrombin Time: 16.7 seconds — ABNORMAL HIGH (ref 11.4–15.2)

## 2022-07-11 LAB — POC OCCULT BLOOD, ED: Fecal Occult Bld: POSITIVE — AB

## 2022-07-11 LAB — GLUCOSE, CAPILLARY: Glucose-Capillary: 149 mg/dL — ABNORMAL HIGH (ref 70–99)

## 2022-07-11 LAB — LIPASE, BLOOD: Lipase: 35 U/L (ref 11–51)

## 2022-07-11 LAB — PREPARE RBC (CROSSMATCH)

## 2022-07-11 LAB — APTT: aPTT: 33 seconds (ref 24–36)

## 2022-07-11 MED ORDER — ONDANSETRON HCL 4 MG PO TABS: 4.0000 mg | ORAL_TABLET | Freq: Four times a day (QID) | ORAL | Status: DC | PRN

## 2022-07-11 MED ORDER — ONDANSETRON HCL 4 MG/2ML IJ SOLN
4.0000 mg | Freq: Four times a day (QID) | INTRAMUSCULAR | Status: DC | PRN
  Administered 2022-07-15 (×2): 4 mg via INTRAVENOUS
  Filled 2022-07-11: qty 2

## 2022-07-11 MED ORDER — INSULIN ASPART 100 UNIT/ML IJ SOLN
0.0000 [IU] | Freq: Every day | INTRAMUSCULAR | Status: DC
  Administered 2022-07-14: 3 [IU] via SUBCUTANEOUS

## 2022-07-11 MED ORDER — INSULIN ASPART 100 UNIT/ML IJ SOLN
0.0000 [IU] | Freq: Three times a day (TID) | INTRAMUSCULAR | Status: DC
  Administered 2022-07-12 – 2022-07-13 (×4): 3 [IU] via SUBCUTANEOUS
  Administered 2022-07-13: 2 [IU] via SUBCUTANEOUS
  Administered 2022-07-14: 5 [IU] via SUBCUTANEOUS
  Administered 2022-07-14 – 2022-07-16 (×3): 3 [IU] via SUBCUTANEOUS
  Administered 2022-07-16 – 2022-07-17 (×2): 5 [IU] via SUBCUTANEOUS

## 2022-07-11 MED ORDER — INSULIN GLARGINE-YFGN 100 UNIT/ML ~~LOC~~ SOLN
52.0000 [IU] | Freq: Every day | SUBCUTANEOUS | Status: DC
  Administered 2022-07-12 – 2022-07-14 (×2): 52 [IU] via SUBCUTANEOUS
  Filled 2022-07-11 (×7): qty 0.52

## 2022-07-11 MED ORDER — PANTOPRAZOLE SODIUM 40 MG IV SOLR
80.0000 mg | Freq: Once | INTRAVENOUS | Status: AC
  Administered 2022-07-11: 80 mg via INTRAVENOUS
  Filled 2022-07-11: qty 20

## 2022-07-11 MED ORDER — LEVOTHYROXINE SODIUM 88 MCG PO TABS
88.0000 ug | ORAL_TABLET | Freq: Every day | ORAL | Status: DC
  Administered 2022-07-12 – 2022-07-17 (×6): 88 ug via ORAL
  Filled 2022-07-11 (×6): qty 1

## 2022-07-11 MED ORDER — ACETAMINOPHEN 325 MG PO TABS
650.0000 mg | ORAL_TABLET | Freq: Four times a day (QID) | ORAL | Status: DC | PRN
  Administered 2022-07-11 – 2022-07-16 (×3): 650 mg via ORAL
  Filled 2022-07-11 (×3): qty 2

## 2022-07-11 MED ORDER — PANTOPRAZOLE SODIUM 40 MG IV SOLR
40.0000 mg | Freq: Two times a day (BID) | INTRAVENOUS | Status: DC
  Administered 2022-07-12 – 2022-07-17 (×11): 40 mg via INTRAVENOUS
  Filled 2022-07-11 (×11): qty 10

## 2022-07-11 MED ORDER — SODIUM CHLORIDE 0.9% IV SOLUTION: Freq: Once | INTRAVENOUS | Status: AC

## 2022-07-11 MED ORDER — INSULIN ASPART 100 UNIT/ML IJ SOLN
10.0000 [IU] | Freq: Three times a day (TID) | INTRAMUSCULAR | Status: DC
  Administered 2022-07-13 (×2): 10 [IU] via SUBCUTANEOUS

## 2022-07-11 NOTE — ED Notes (Signed)
ED TO INPATIENT HANDOFF REPORT  ED Nurse Name and Phone #: [phone]  S Name/Age/Gender Jessica Torres 82 y.o. female Room/Bed: APA05/APA05  Code Status   Code Status: Prior  Home/SNF/Other Home Patient oriented to: self, place, time, and situation Is this baseline? Yes   Triage Complete: Triage complete  Chief Complaint Acute blood loss anemia [D62]  Triage Note Pt here sent from MD office for low hgb , pt with hx of same , juast had a blood transfusion 2 months ago , pt is c/o weakness and sob    Allergies Allergies  Allergen Reactions   Morphine And Related Swelling and Other (See Comments)    Facial swelling   Latex Rash   Statins Other (See Comments)    Myalgias     Level of Care/Admitting Diagnosis ED Disposition     ED Disposition  Admit   Condition  --   Comment  Hospital Area: [facility] [[id number]]  Level of Care: Telemetry [5]  Covid Evaluation: Asymptomatic - no recent exposure (last 10 days) testing not required  Diagnosis: Acute blood loss anemia [119147]  Admitting Physician: Levie Heritage [[id number]]  Attending Physician: Levie Heritage [[id number]]  Certification:: I certify this patient is being admitted for an inpatient-only procedure  Estimated Length of Stay: 3          B Medical/Surgery History Past Medical History:  Diagnosis Date   Arthritis    "fingers probably" (10/30/2016)   Bulging lumbar disc    Chronic lower back pain    Difficulty urinating    GERD (gastroesophageal reflux disease)    Heart murmur    hx   Hyperlipidemia    statin intolerant   Hypertension    Hypothyroidism    PAD (peripheral artery disease) (HCC)    Peripheral arterial disease (HCC)    Shortness of breath dyspnea    Sleep apnea    "dx'd years ago; had severe problems; all of the sudden it just went away" (07/06/2014) *still gone" (10/30/2016)   Stroke [facility]) ~ 2010   denies residual on 10/30/2016   Type 2 diabetes mellitus (HCC)    Urgency of  urination    Uterine cancer [facility])    Past Surgical History:  Procedure Laterality Date   ANGIOPLASTY Right 01/22/2015   w/ atherectomy R sfa   BALLOON ANGIOPLASTY, ARTERY Right    SFA/notes 07/06/2014   BIOPSY  04/28/2022   Procedure: BIOPSY;  Surgeon: Tressia Danas, MD;  Location: [facility] ENDOSCOPY;  Service: Gastroenterology;;   BREAST EXCISIONAL BIOPSY Left    BREAST SURGERY     CARPAL TUNNEL RELEASE Right 1980's   COLONOSCOPY WITH PROPOFOL N/A 11/26/2020   Procedure: COLONOSCOPY WITH PROPOFOL;  Surgeon: Lanelle Bal, DO;  [facility] ENDO [facility];  Service: Endoscopy;  Laterality: N/A;  9:00am, pt cannot come in earlier   DILATION AND CURETTAGE OF UTERUS     ESOPHAGOGASTRODUODENOSCOPY N/A 04/28/2022   Procedure: ESOPHAGOGASTRODUODENOSCOPY (EGD);  Surgeon: Tressia Danas, MD;  Location: [facility] ENDOSCOPY;  Service: Gastroenterology;  Laterality: N/A;   ESOPHAGOGASTRODUODENOSCOPY (EGD) WITH PROPOFOL N/A 04/30/2022   Procedure: ESOPHAGOGASTRODUODENOSCOPY (EGD) WITH PROPOFOL;  Surgeon: Tressia Danas, MD;  Location: [facility] ENDOSCOPY;  Service: Gastroenterology;  Laterality: N/A;  With [facility]   HEMORRHOID SURGERY     "dr lanced it; in dr's office"   HOT HEMOSTASIS N/A 04/30/2022   Procedure: HOT HEMOSTASIS (ARGON PLASMA COAGULATION/BICAP);  Surgeon: Tressia Danas, MD;  Location: [facility] ENDOSCOPY;  Service: Gastroenterology;  Laterality: N/A;  INCISION AND DRAINAGE BREAST ABSCESS Left    LOWER EXTREMITY ANGIOGRAM N/A 07/06/2014   Procedure: LOWER EXTREMITY ANGIOGRAM & R-SFA atherectomy and balloon angioplasty;  Surgeon: Runell Gess, MD;  Location: [facility] CATH LAB;  Service: Cardiovascular;  Laterality: R   LOWER EXTREMITY ANGIOGRAM  10/30/2016   LOWER EXTREMITY ANGIOGRAPHY N/A 10/30/2016   Procedure: Lower Extremity Angiography;  Surgeon: Runell Gess, MD;  Location: [facility] INVASIVE CV LAB;  Service: Cardiovascular;  Laterality: N/A;   PERIPHERAL VASCULAR CATHETERIZATION N/A 08/17/2014    Procedure: Lower Extremity Angiography;  Surgeon: Runell Gess, MD;  Location: [facility] INVASIVE CV LAB;  Service: Cardiovascular;  Laterality: N/A;   PERIPHERAL VASCULAR CATHETERIZATION Left 08/17/2014   Procedure: Peripheral Vascular Atherectomy and Viabahn stent L-SFA;  Surgeon: Runell Gess, MD; Laterality: Left; L-SFA   PERIPHERAL VASCULAR CATHETERIZATION N/A 01/11/2015   Procedure: Lower Extremity Angiography;  Surgeon: Runell Gess, MD;  Location: [facility] INVASIVE CV LAB;  Service: Cardiovascular;  Laterality: N/A;   PERIPHERAL VASCULAR CATHETERIZATION Right 01/22/2015   Procedure: Peripheral Vascular Atherectomy;  Surgeon: Runell Gess, MD;  Location: [facility] INVASIVE CV LAB;  Service: Cardiovascular;  Laterality: Right;   POLYPECTOMY  11/26/2020   Procedure: POLYPECTOMY;  Surgeon: Lanelle Bal, DO;  Location: [facility] ENDO SUITE;  Service: Endoscopy;;   TUBAL LIGATION     VAGINAL HYSTERECTOMY       A IV Location/Drains/Wounds Patient Lines/Drains/Airways Status     Active Line/Drains/Airways     Name Placement date Placement time Site Days   Peripheral IV 07/11/22 18 G Anterior;Proximal;Right Forearm 07/11/22  1530  Forearm  less than 1   Urethral Catheter Briscoe Burns 07/11/22  1837  --  less than 1            Intake/Output Last 24 hours No intake or output data in the 24 hours ending 07/11/22 1929  Labs/Imaging Results for orders placed or performed during the hospital encounter of 07/11/22 (from the past 48 hour(s))  Comprehensive metabolic panel     Status: Abnormal   Collection Time: 07/11/22  3:21 PM  Result Value Ref Range   Sodium 132 (L) 135 - 145 mmol/L   Potassium 4.1 3.5 - 5.1 mmol/L   Chloride 104 98 - 111 mmol/L   CO2 19 (L) 22 - 32 mmol/L   Glucose, Bld 191 (H) 70 - 99 mg/dL    Comment: Glucose reference range applies only to samples taken after fasting for at least 8 hours.   BUN 22 8 - 23 mg/dL   Creatinine, Ser 1.61 (H) 0.44 - 1.00 mg/dL   Calcium 9.3  8.9 - 09.6 mg/dL   Total Protein 6.7 6.5 - 8.1 g/dL   Albumin 2.9 (L) 3.5 - 5.0 g/dL   AST 34 15 - 41 U/L   ALT 13 0 - 44 U/L   Alkaline Phosphatase 56 38 - 126 U/L   Total Bilirubin 2.7 (H) 0.3 - 1.2 mg/dL   GFR, Estimated 38 (L) >60 mL/min    Comment: (NOTE) Calculated using the CKD-EPI Creatinine Equation (2021)    Anion gap 9 5 - 15    Comment: Performed at [facility], [address] [place], [place] [address]  CBC with Differential     Status: Abnormal   Collection Time: 07/11/22  3:21 PM  Result Value Ref Range   WBC 8.1 4.0 - 10.5 K/uL   RBC 2.11 (L) 3.87 - 5.11 MIL/uL   Hemoglobin 6.1 (LL) 12.0 - 15.0  g/dL    Comment: REPEATED TO VERIFY THIS CRITICAL RESULT HAS VERIFIED AND BEEN CALLED TO JOYCE,A. BY ASHLEY FRATTO ON 05 03 2024 AT 1542, AND HAS BEEN READ BACK.     HCT 19.2 (L) 36.0 - 46.0 %   MCV 91.0 80.0 - 100.0 fL   MCH 28.9 26.0 - 34.0 pg   MCHC 31.8 30.0 - 36.0 g/dL   RDW 81.1 (H) 91.4 - 78.2 %   Platelets 270 150 - 400 K/uL   nRBC 0.0 0.0 - 0.2 %   Neutrophils Relative % 66 %   Neutro Abs 5.3 1.7 - 7.7 K/uL   Lymphocytes Relative 20 %   Lymphs Abs 1.6 0.7 - 4.0 K/uL   Monocytes Relative 12 %   Monocytes Absolute 1.0 0.1 - 1.0 K/uL   Eosinophils Relative 1 %   Eosinophils Absolute 0.1 0.0 - 0.5 K/uL   Basophils Relative 1 %   Basophils Absolute 0.1 0.0 - 0.1 K/uL   Immature Granulocytes 0 %   Abs Immature Granulocytes 0.03 0.00 - 0.07 K/uL    Comment: Performed at [facility], [address], [place], [place] [address]  Type and screen [facility]     Status: None (Preliminary result)   Collection Time: 07/11/22  3:21 PM  Result Value Ref Range   ABO/RH(D) O NEG    Antibody Screen NEG    Sample Expiration 07/14/2022,2359    Unit Number [id number]    Blood Component Type RED CELLS,LR    Unit division 00    Status of Unit ISSUED    Transfusion Status OK TO TRANSFUSE    Crossmatch Result      Compatible Performed at [facility],  [address] [place], [place] [address]    Unit Number [id number]    Blood Component Type RED CELLS,LR    Unit division 00    Status of Unit ALLOCATED    Transfusion Status OK TO TRANSFUSE    Crossmatch Result Compatible   Protime-INR     Status: Abnormal   Collection Time: 07/11/22  3:21 PM  Result Value Ref Range   Prothrombin Time 16.7 (H) 11.4 - 15.2 seconds   INR 1.4 (H) 0.8 - 1.2    Comment: (NOTE) INR goal varies based on device and disease states. Performed at [facility], [address], [place], [place] [address]   APTT     Status: None   Collection Time: 07/11/22  3:21 PM  Result Value Ref Range   aPTT 33 24 - 36 seconds    Comment: Performed at [facility], [address], [place], [place] [address]  Troponin I (High Sensitivity)     Status: Abnormal   Collection Time: 07/11/22  3:21 PM  Result Value Ref Range   Troponin I (High Sensitivity) 252 (HH) <18 ng/L    Comment: CRITICAL RESULT CALLED TO, READ BACK BY AND VERIFIED WITH JOYCE, A AT 1621 ON 07/11/22 BY SMN. (NOTE) Elevated high sensitivity troponin I (hsTnI) values and significant  changes across serial measurements may suggest ACS but many other  chronic and acute conditions are known to elevate hsTnI results.  Refer to the "Links" section for chest pain algorithms and additional  guidance. Performed at [facility], [address] [place], [place] [address]   Lipase, blood     Status: None   Collection Time: 07/11/22  3:21 PM  Result Value Ref Range   Lipase 35 11 - 51 U/L  Comment: Performed at [facility], [address] [place], [place] [address]  Prepare RBC (crossmatch)     Status: None   Collection Time: 07/11/22  3:21 PM  Result Value Ref Range   Order Confirmation      ORDER PROCESSED BY BLOOD BANK Performed at [facility], [address], [place], [place] [address]   Troponin I (High Sensitivity)     Status: Abnormal   Collection Time: 07/11/22  5:40 PM  Result Value Ref Range    Troponin I (High Sensitivity) 271 (HH) <18 ng/L    Comment: DELTA CHECK NOTED CRITICAL VALUE NOTED. VALUE IS CONSISTENT WITH PREVIOUSLY REPORTED/CALLED VALUE (NOTE) Elevated high sensitivity troponin I (hsTnI) values and significant  changes across serial measurements may suggest ACS but many other  chronic and acute conditions are known to elevate hsTnI results.  Refer to the "Links" section for chest pain algorithms and additional  guidance. Performed at [facility], [address], [place], [place] [address]   POC occult blood, ED     Status: Abnormal   Collection Time: 07/11/22  5:48 PM  Result Value Ref Range   Fecal Occult Bld POSITIVE (A) NEGATIVE   No results found.  Pending Labs Unresulted Labs (From admission, onward)     Start     Ordered   07/11/22 1542  Occult blood card to lab, stool Provider will collect  Once,   URGENT       Question:  Specimen to be collected by:  Answer:  Provider will collect   07/11/22 1541            Vitals/Pain Today's Vitals   07/11/22 1841 07/11/22 1845 07/11/22 1905 07/11/22 1915  BP:  (!) 110/53 112/87   Pulse:  (!) 106 (!) 101 (!) 101  Resp:  19 (!) 21 20  Temp: 98.6 F (37 C)     TempSrc: Oral     SpO2:  90% 94% 95%  PainSc:        Isolation Precautions No active isolations  Medications Medications  0.9 %  sodium chloride infusion (Manually program via Guardrails IV Fluids) ( Intravenous New Bag/Given 07/11/22 1729)  pantoprazole (PROTONIX) injection 80 mg (80 mg Intravenous Given 07/11/22 1717)    Mobility walks with person assist     Focused Assessments     R Recommendations: See Admitting Provider Note  Report given to:   Additional Notes: on first unit of blood / A&O / continent

## 2022-07-11 NOTE — ED Triage Notes (Signed)
Pt here sent from MD office for low hgb , pt with hx of same , juast had a blood transfusion 2 months ago , pt is c/o weakness and sob

## 2022-07-11 NOTE — ED Notes (Signed)
Date and time results received: 07/11/22 1547 (use smartphrase ".now" to insert current time)  Test: Hemoglobin Critical Value: 6.1  Name of Provider Notified: Carmel Sacramento, PA  Orders Received? Or Actions Taken?:

## 2022-07-11 NOTE — H&P (Signed)
History and Physical    [name]:[id number] DOB: [birth year] DOA: 07/11/2022 DOS: the patient was seen and examined on 07/11/2022 PCP: Rebekah Chesterfield, NP  Patient coming from: Home  Chief Complaint:  Chief Complaint  Patient presents with   Abnormal Lab   HPI: Jessica Torres is a 82 y.o. female with medical history significant of peripheral artery disease on Plavix, GAVE, GERD, history of stroke, diabetes, hypertension, hypothyroidism.  Patient was sent to the emergency department by her PCP after blood work yesterday showed acute anemia with a reported hemoglobin of 4 point something.  The exact result is not available in our medical record or in Care Everywhere.  The labs were checked because she was feeling weak and tired.  She has been having chest pain and generalized upper abdominal pain.  She was diagnosed few months ago with an NSTEMI as a result of an upper GI bleed and acute blood loss anemia.  In the emergency department, her hemoglobin was 6.1.  She was found to be melanotic and her troponin was 252 and increased to 271 2 hours later.  She was crossmatched for 2 units and was undergoing blood transfusion during the second blood draw.  Review of Systems: As mentioned in the history of present illness. All other systems reviewed and are negative. Past Medical History:  Diagnosis Date   Arthritis    "fingers probably" (10/30/2016)   Bulging lumbar disc    Chronic lower back pain    Difficulty urinating    GERD (gastroesophageal reflux disease)    Heart murmur    hx   Hyperlipidemia    statin intolerant   Hypertension    Hypothyroidism    PAD (peripheral artery disease) (HCC)    Peripheral arterial disease (HCC)    Shortness of breath dyspnea    Sleep apnea    "dx'd years ago; had severe problems; all of the sudden it just went away" (07/06/2014) *still gone" (10/30/2016)   Stroke [facility]) ~ 2010   denies residual on 10/30/2016   Type 2 diabetes mellitus (HCC)     Urgency of urination    Uterine cancer [facility])    Past Surgical History:  Procedure Laterality Date   ANGIOPLASTY Right 01/22/2015   w/ atherectomy R sfa   BALLOON ANGIOPLASTY, ARTERY Right    SFA/notes 07/06/2014   BIOPSY  04/28/2022   Procedure: BIOPSY;  Surgeon: Tressia Danas, MD;  Location: [facility] ENDOSCOPY;  Service: Gastroenterology;;   BREAST EXCISIONAL BIOPSY Left    BREAST SURGERY     CARPAL TUNNEL RELEASE Right 1980's   COLONOSCOPY WITH PROPOFOL N/A 11/26/2020   Procedure: COLONOSCOPY WITH PROPOFOL;  Surgeon: Lanelle Bal, DO;  Location: [facility] ENDO SUITE;  Service: Endoscopy;  Laterality: N/A;  9:00am, pt cannot come in earlier   DILATION AND CURETTAGE OF UTERUS     ESOPHAGOGASTRODUODENOSCOPY N/A 04/28/2022   Procedure: ESOPHAGOGASTRODUODENOSCOPY (EGD);  Surgeon: Tressia Danas, MD;  Location: [facility] ENDOSCOPY;  Service: Gastroenterology;  Laterality: N/A;   ESOPHAGOGASTRODUODENOSCOPY (EGD) WITH PROPOFOL N/A 04/30/2022   Procedure: ESOPHAGOGASTRODUODENOSCOPY (EGD) WITH PROPOFOL;  Surgeon: Tressia Danas, MD;  Location: [facility] ENDOSCOPY;  Service: Gastroenterology;  Laterality: N/A;  With [facility]   HEMORRHOID SURGERY     "dr lanced it; in dr's office"   HOT HEMOSTASIS N/A 04/30/2022   Procedure: HOT HEMOSTASIS (ARGON PLASMA COAGULATION/BICAP);  Surgeon: Tressia Danas, MD;  Location: [facility] ENDOSCOPY;  Service: Gastroenterology;  Laterality: N/A;   INCISION AND DRAINAGE BREAST ABSCESS Left  LOWER EXTREMITY ANGIOGRAM N/A 07/06/2014   Procedure: LOWER EXTREMITY ANGIOGRAM & R-SFA atherectomy and balloon angioplasty;  Surgeon: Runell Gess, MD;  Location: [facility] CATH LAB;  Service: Cardiovascular;  Laterality: R   LOWER EXTREMITY ANGIOGRAM  10/30/2016   LOWER EXTREMITY ANGIOGRAPHY N/A 10/30/2016   Procedure: Lower Extremity Angiography;  Surgeon: Runell Gess, MD;  Location: [facility] INVASIVE CV LAB;  Service: Cardiovascular;  Laterality: N/A;   PERIPHERAL VASCULAR CATHETERIZATION N/A  08/17/2014   Procedure: Lower Extremity Angiography;  Surgeon: Runell Gess, MD;  Location: [facility] INVASIVE CV LAB;  Service: Cardiovascular;  Laterality: N/A;   PERIPHERAL VASCULAR CATHETERIZATION Left 08/17/2014   Procedure: Peripheral Vascular Atherectomy and Viabahn stent L-SFA;  Surgeon: Runell Gess, MD; Laterality: Left; L-SFA   PERIPHERAL VASCULAR CATHETERIZATION N/A 01/11/2015   Procedure: Lower Extremity Angiography;  Surgeon: Runell Gess, MD;  Location: [facility] INVASIVE CV LAB;  Service: Cardiovascular;  Laterality: N/A;   PERIPHERAL VASCULAR CATHETERIZATION Right 01/22/2015   Procedure: Peripheral Vascular Atherectomy;  Surgeon: Runell Gess, MD;  Location: [facility] INVASIVE CV LAB;  Service: Cardiovascular;  Laterality: Right;   POLYPECTOMY  11/26/2020   Procedure: POLYPECTOMY;  Surgeon: Lanelle Bal, DO;  Location: [facility] ENDO SUITE;  Service: Endoscopy;;   TUBAL LIGATION     VAGINAL HYSTERECTOMY     Social History:  reports that she has never smoked. She has never used smokeless tobacco. She reports current alcohol use. She reports that she does not use drugs.  Allergies  Allergen Reactions   Morphine And Related Swelling and Other (See Comments)    Facial swelling   Latex Rash   Statins Other (See Comments)    Myalgias     Family History  Problem Relation Age of Onset   Heart attack Mother    Heart disease Mother    Arrhythmia Mother        has PPM   Heart disease Father    Diabetes Sister    Breast cancer Neg Hx     Prior to Admission medications   Medication Sig Start Date End Date Taking? Authorizing Provider  Accu-Chek Softclix Lancets lancets Test BS TID Dx E11.9 12/02/21   Jannifer Rodney A, FNP  Alcohol Swabs (B-D SINGLE USE SWABS REGULAR) PADS Test BS 4 times daily Dx E11.9 04/02/21   Junie Spencer, FNP  BD PEN NEEDLE NANO 2ND GEN 32G X 4 MM MISC Use twice daily with insulin Dx E11.9 11/25/21   Junie Spencer, FNP  cilostazol (PLETAL) 50 MG tablet Take 1  tablet (50 mg total) by mouth 2 (two) times daily. Patient not taking: Reported on 04/27/2022 07/25/20   Runell Gess, MD  clopidogrel (PLAVIX) 75 MG tablet TAKE 1 TABLET DAILY 04/02/22   Jannifer Rodney A, FNP  empagliflozin (JARDIANCE) 25 MG TABS tablet Take 25 mg by mouth as needed (low blood sugar).    [provider]  ezetimibe (ZETIA) 10 MG tablet Take 1 tablet (10 mg total) by mouth daily. 08/22/21   Junie Spencer, FNP  fenofibrate 160 MG tablet TAKE 1 TABLET DAILY 04/02/22   Jannifer Rodney A, FNP  ferrous sulfate 325 (65 FE) MG EC tablet Take 1 tablet (325 mg total) by mouth every Monday, Wednesday, and Friday. 04/30/22 06/25/22  Zigmund Daniel., MD  glucose blood (FREESTYLE LITE) test strip Test BS TID Dx E11.9 11/25/21   Jannifer Rodney A, FNP  insulin glargine, 2 Unit Dial, (TOUJEO MAX SOLOSTAR) 300 UNIT/ML  Solostar Pen INJECT 52 UNITS UNDER THE SKIN DAILY Patient taking differently: Inject 52 Units into the skin at bedtime. 03/18/22   Jannifer Rodney A, FNP  insulin lispro (HUMALOG) 100 UNIT/ML KwikPen Inject 10-20 Units into the skin 3 (three) times daily. Patient taking differently: Inject 20-22 Units into the skin at bedtime. 08/22/21   Junie Spencer, FNP  levothyroxine (SYNTHROID) 88 MCG tablet TAKE 1 TABLET DAILY BEFORE BREAKFAST Patient taking differently: Take 88 mcg by mouth daily before breakfast. 04/24/22   Jannifer Rodney A, FNP  pantoprazole (PROTONIX) 40 MG tablet Take 1 tablet (40 mg total) by mouth 2 (two) times daily. Follow up with gastroenterology for additional recommendations 04/30/22 06/29/22  Zigmund Daniel., MD  Semaglutide,0.25 or 0.5MG /DOS, 2 MG/3ML SOPN Inject 0.25 mg into the skin once a week. Patient not taking: Reported on 04/27/2022 03/14/22   Junie Spencer, FNP  metFORMIN (GLUCOPHAGE) 1000 MG tablet Take 1 tablet (1,000 mg total) by mouth 2 (two) times daily with a meal. Patient taking differently: Take 1,000 mg by mouth 2 (two) times daily  with a meal. Only taking once a day 04/06/19 07/13/19  Remus Loffler, PA-C    Physical Exam: Vitals:   07/11/22 1845 07/11/22 1905 07/11/22 1915 07/11/22 1930  BP: (!) 110/53 112/87  (!) 117/54  Pulse: (!) 106 (!) 101 (!) 101 (!) 105  Resp: 19 (!) 21 20 20   Temp:      TempSrc:      SpO2: 90% 94% 95% 91%   General: Elderly female. Awake and alert and oriented x3. No acute cardiopulmonary distress.  HEENT: Normocephalic atraumatic.  Right and left ears normal in appearance.  Pupils equal, round, reactive to light. Extraocular muscles are intact. Sclerae anicteric and noninjected.  Moist mucosal membranes. No mucosal lesions.  Neck: Neck supple without lymphadenopathy. No carotid bruits. No masses palpated.  Cardiovascular: Regular rate with normal S1-S2 sounds. No murmurs, rubs, gallops auscultated. No JVD.  Respiratory: Good respiratory effort with no wheezes, rales, rhonchi. Lungs clear to auscultation bilaterally.  No accessory muscle use. Abdomen: Soft, diffuse tender, but mostly tender in the upper quadrants.  No rebound or guarding. Nondistended. Active bowel sounds. No masses or hepatosplenomegaly  Skin: No rashes, lesions, or ulcerations.  Dry, warm to touch. 2+ dorsalis pedis and radial pulses. Musculoskeletal: No calf or leg pain. All major joints not erythematous nontender.  No upper or lower joint deformation.  Good ROM.  No contractures  Psychiatric: Intact judgment and insight. Pleasant and cooperative. Neurologic: No focal neurological deficits. Strength is 5/5 and symmetric in upper and lower extremities.  Cranial nerves II through XII are grossly intact.  Data Reviewed: Results for orders placed or performed during the hospital encounter of 07/11/22 (from the past 24 hour(s))  Comprehensive metabolic panel     Status: Abnormal   Collection Time: 07/11/22  3:21 PM  Result Value Ref Range   Sodium 132 (L) 135 - 145 mmol/L   Potassium 4.1 3.5 - 5.1 mmol/L   Chloride 104 98 -  111 mmol/L   CO2 19 (L) 22 - 32 mmol/L   Glucose, Bld 191 (H) 70 - 99 mg/dL   BUN 22 8 - 23 mg/dL   Creatinine, Ser 1.61 (H) 0.44 - 1.00 mg/dL   Calcium 9.3 8.9 - 09.6 mg/dL   Total Protein 6.7 6.5 - 8.1 g/dL   Albumin 2.9 (L) 3.5 - 5.0 g/dL   AST 34 15 - 41 U/L  ALT 13 0 - 44 U/L   Alkaline Phosphatase 56 38 - 126 U/L   Total Bilirubin 2.7 (H) 0.3 - 1.2 mg/dL   GFR, Estimated 38 (L) >60 mL/min   Anion gap 9 5 - 15  CBC with Differential     Status: Abnormal   Collection Time: 07/11/22  3:21 PM  Result Value Ref Range   WBC 8.1 4.0 - 10.5 K/uL   RBC 2.11 (L) 3.87 - 5.11 MIL/uL   Hemoglobin 6.1 (LL) 12.0 - 15.0 g/dL   HCT 16.1 (L) 09.6 - 04.5 %   MCV 91.0 80.0 - 100.0 fL   MCH 28.9 26.0 - 34.0 pg   MCHC 31.8 30.0 - 36.0 g/dL   RDW 40.9 (H) 81.1 - 91.4 %   Platelets 270 150 - 400 K/uL   nRBC 0.0 0.0 - 0.2 %   Neutrophils Relative % 66 %   Neutro Abs 5.3 1.7 - 7.7 K/uL   Lymphocytes Relative 20 %   Lymphs Abs 1.6 0.7 - 4.0 K/uL   Monocytes Relative 12 %   Monocytes Absolute 1.0 0.1 - 1.0 K/uL   Eosinophils Relative 1 %   Eosinophils Absolute 0.1 0.0 - 0.5 K/uL   Basophils Relative 1 %   Basophils Absolute 0.1 0.0 - 0.1 K/uL   Immature Granulocytes 0 %   Abs Immature Granulocytes 0.03 0.00 - 0.07 K/uL  Type and screen [facility]     Status: None (Preliminary result)   Collection Time: 07/11/22  3:21 PM  Result Value Ref Range   ABO/RH(D) O NEG    Antibody Screen NEG    Sample Expiration 07/14/2022,2359    Unit Number [id number]    Blood Component Type RED CELLS,LR    Unit division 00    Status of Unit ISSUED    Transfusion Status OK TO TRANSFUSE    Crossmatch Result      Compatible Performed at [facility], [address] [place], [place] [address]    Unit Number [id number]    Blood Component Type RED CELLS,LR    Unit division 00    Status of Unit ALLOCATED    Transfusion Status OK TO TRANSFUSE    Crossmatch Result Compatible   Protime-INR      Status: Abnormal   Collection Time: 07/11/22  3:21 PM  Result Value Ref Range   Prothrombin Time 16.7 (H) 11.4 - 15.2 seconds   INR 1.4 (H) 0.8 - 1.2  APTT     Status: None   Collection Time: 07/11/22  3:21 PM  Result Value Ref Range   aPTT 33 24 - 36 seconds  Troponin I (High Sensitivity)     Status: Abnormal   Collection Time: 07/11/22  3:21 PM  Result Value Ref Range   Troponin I (High Sensitivity) 252 (HH) <18 ng/L  Lipase, blood     Status: None   Collection Time: 07/11/22  3:21 PM  Result Value Ref Range   Lipase 35 11 - 51 U/L  Prepare RBC (crossmatch)     Status: None   Collection Time: 07/11/22  3:21 PM  Result Value Ref Range   Order Confirmation      ORDER PROCESSED BY BLOOD BANK Performed at [facility], [address] [place], [place] [address]   Troponin I (High Sensitivity)     Status: Abnormal   Collection Time: 07/11/22  5:40 PM  Result Value Ref Range   Troponin I (High Sensitivity) 271 (HH) <18 ng/L  POC occult blood, ED     Status: Abnormal   Collection Time: 07/11/22  5:48 PM  Result Value Ref Range   Fecal Occult Bld POSITIVE (A) NEGATIVE    No results found.   Assessment and Plan: No notes have been filed under this hospital service. Service: Hospitalist  Principal Problem:   Acute blood loss anemia Active Problems:   NSTEMI (non-ST elevated myocardial infarction) (HCC)   Heart failure with preserved ejection fraction (HCC)   DM (diabetes mellitus) (HCC)   Hypertension associated with diabetes (HCC)   Chronic kidney disease, stage III (moderate) (HCC)   Peripheral arterial disease - Bilateral SFA    Hypothyroidism   Coronary artery disease due to lipid rich plaque   GAVE (gastric antral vascular ectasia)  Acute blood loss anemia second to GAVE and GERD Protonix GI consulted Stop Plavix Patient transfused 2 units Check CBC in the morning N.p.o. after midnight Diabetes Continue long-acting insulin, short acting insulin Hold short  acting insulin while n.p.o. Hold Januvia Sliding scale insulin CBGs before meals and nightly History of NSTEMI with elevated troponins, hypertension, coronary artery disease, HFpEF Troponins elevated -likely secondary to cardiac strain with a recent NSTEMI and acute blood loss anemia Transfuse to get hemoglobin above 9 Follow troponins every 6 hours Peripheral artery disease Holding Plavix currently Chronic kidney disease stage III Appears to be at baseline We will follow creatinine Hypothyroidism Continue levothyroxine   Advance Care Planning:   Code Status: Full Code confirmed by patient  Consults: Gastroenterology  Family Communication: None  Severity of Illness: The appropriate patient status for this patient is INPATIENT. Inpatient status is judged to be reasonable and necessary in order to provide the required intensity of service to ensure the patient's safety. The patient's presenting symptoms, physical exam findings, and initial radiographic and laboratory data in the context of their chronic comorbidities is felt to place them at high risk for further clinical deterioration. Furthermore, it is not anticipated that the patient will be medically stable for discharge from the hospital within 2 midnights of admission.   * I certify that at the point of admission it is my clinical judgment that the patient will require inpatient hospital care spanning beyond 2 midnights from the point of admission due to high intensity of service, high risk for further deterioration and high frequency of surveillance required.*  Author: Levie Heritage, DO 07/11/2022 8:04 PM  For on call review [email or web address].

## 2022-07-11 NOTE — ED Provider Notes (Cosign Needed)
EMERGENCY DEPARTMENT AT [facility] Provider Note   CSN: [id number] Arrival date & time: 07/11/22  1423     History  Chief Complaint  Patient presents with   Abnormal Lab    Jessica Torres is a 82 y.o. female.She has history of PVD, CVA, is on plavix, anemia thought to be secondary to GI bleed, and NSTEMI.  Presents the ER complaining of low blood count.  Patient states she has been feeling unwell since December of last year.  Been having chronic epigastric and chest pain and shortness of breath and decreased appetite.  Also having dark stools.  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 She has been on Protonix since then    Abnormal Lab      Home Medications Prior to Admission medications   Medication Sig Start Date End Date Taking? Authorizing Provider  Accu-Chek Softclix Lancets lancets Test BS TID Dx E11.9 12/02/21   Jannifer Rodney A, FNP  Alcohol Swabs (B-D SINGLE USE SWABS REGULAR) PADS Test BS 4 times daily Dx E11.9 04/02/21   Junie Spencer, FNP  BD PEN NEEDLE NANO 2ND GEN 32G X 4 MM MISC Use twice daily with insulin Dx E11.9 11/25/21   Junie Spencer, FNP  cilostazol (PLETAL) 50 MG tablet Take 1 tablet (50 mg total) by mouth 2 (two) times daily. Patient not taking: Reported on 04/27/2022 07/25/20   Runell Gess, MD  clopidogrel (PLAVIX) 75 MG tablet TAKE 1 TABLET DAILY 04/02/22   Jannifer Rodney A, FNP  empagliflozin (JARDIANCE) 25 MG TABS tablet Take 25 mg by mouth as needed (low blood sugar).    [provider]  ezetimibe (ZETIA) 10 MG tablet Take 1 tablet (10 mg total) by mouth daily. 08/22/21   Junie Spencer, FNP  fenofibrate 160 MG tablet TAKE 1 TABLET DAILY 04/02/22   Jannifer Rodney A, FNP  ferrous sulfate 325 (65 FE) MG EC tablet Take 1 tablet (325 mg total) by mouth every Monday, Wednesday, and Friday. 04/30/22 06/25/22  Zigmund Daniel., MD   glucose blood (FREESTYLE LITE) test strip Test BS TID Dx E11.9 11/25/21   Jannifer Rodney A, FNP  insulin glargine, 2 Unit Dial, (TOUJEO MAX SOLOSTAR) 300 UNIT/ML Solostar Pen INJECT 52 UNITS UNDER THE SKIN DAILY Patient taking differently: Inject 52 Units into the skin at bedtime. 03/18/22   Jannifer Rodney A, FNP  insulin lispro (HUMALOG) 100 UNIT/ML KwikPen Inject 10-20 Units into the skin 3 (three) times daily. Patient taking differently: Inject 20-22 Units into the skin at bedtime. 08/22/21   Junie Spencer, FNP  levothyroxine (SYNTHROID) 88 MCG tablet TAKE 1 TABLET DAILY BEFORE BREAKFAST Patient taking differently: Take 88 mcg by mouth daily before breakfast. 04/24/22   Jannifer Rodney A, FNP  pantoprazole (PROTONIX) 40 MG tablet Take 1 tablet (40 mg total) by mouth 2 (two) times daily. Follow up with gastroenterology for additional recommendations 04/30/22 06/29/22  Zigmund Daniel., MD  Semaglutide,0.25 or 0.5MG /DOS, 2 MG/3ML SOPN Inject 0.25 mg into the skin once a week. Patient not taking: Reported on 04/27/2022 03/14/22   Junie Spencer, FNP  metFORMIN (GLUCOPHAGE) 1000 MG tablet Take 1 tablet (1,000 mg total) by mouth 2 (two) times daily with a meal. Patient taking differently: Take 1,000 mg by mouth 2 (two) times daily with a meal. Only taking once a day 04/06/19 07/13/19  Remus Loffler, PA-C      Allergies    Morphine and related, Latex, and Statins    Review of Systems   Review of Systems  Physical Exam Updated Vital Signs BP (!) 110/53   Pulse (!) 106   Temp 98.6 F (37 C) (Oral)   Resp 19   SpO2 90%  Physical Exam Vitals and nursing note reviewed.  Constitutional:      General: She is not in acute distress.    Appearance: She is well-developed and well-groomed.  HENT:     Head: Normocephalic and atraumatic.     Mouth/Throat:     Mouth: Mucous membranes are moist.  Eyes:     Extraocular Movements: Extraocular movements intact.     Comments: Conjunctiva not  injected, pale palpebral conjunctiva  Cardiovascular:     Rate and Rhythm: Normal rate and regular rhythm.     Heart sounds: No murmur heard. Pulmonary:     Effort: Pulmonary effort is normal. No respiratory distress.     Breath sounds: Normal breath sounds.  Abdominal:     Palpations: Abdomen is soft.     Tenderness: There is no abdominal tenderness.  Genitourinary:    Rectum: Normal. Guaiac result positive. No mass or tenderness.     Comments: Stool very dark brown Musculoskeletal:        General: No swelling.     Cervical back: Neck supple.  Skin:    General: Skin is warm and dry.     Capillary Refill: Capillary refill takes less than 2 seconds.  Neurological:     Mental Status: She is alert.  Psychiatric:        Mood and Affect: Mood normal.     ED Results / Procedures / Treatments   Labs (all labs ordered are listed, but only abnormal results are displayed) Labs Reviewed  COMPREHENSIVE METABOLIC PANEL - Abnormal; Notable for the following components:      Result Value   Sodium 132 (*)    CO2 19 (*)    Glucose, Bld 191 (*)    Creatinine, Ser 1.40 (*)    Albumin 2.9 (*)    Total Bilirubin 2.7 (*)    GFR, Estimated 38 (*)    All other components within normal limits  CBC WITH DIFFERENTIAL/PLATELET - Abnormal; Notable for the following components:   RBC 2.11 (*)    Hemoglobin 6.1 (*)    HCT 19.2 (*)    RDW 15.9 (*)    All other components within normal limits  PROTIME-INR - Abnormal; Notable for the following components:   Prothrombin Time 16.7 (*)    INR 1.4 (*)    All other components within normal limits  POC OCCULT BLOOD, ED - Abnormal; Notable for the following components:   Fecal Occult Bld POSITIVE (*)    All other components within normal limits  TROPONIN I (HIGH SENSITIVITY) - Abnormal; Notable for the following components:   Troponin I (High Sensitivity) 252 (*)    All other components within normal limits  TROPONIN I (HIGH SENSITIVITY) - Abnormal;  Notable for the following components:   Troponin I (High Sensitivity) 271 (*)    All other components within normal limits  APTT  LIPASE, BLOOD  OCCULT BLOOD X 1 CARD TO LAB, STOOL  TYPE AND SCREEN  PREPARE RBC (CROSSMATCH)    EKG None  Radiology No results found.  Procedures Procedures    Medications Ordered in ED Medications  0.9 %  sodium  chloride infusion (Manually program via Guardrails IV Fluids) ( Intravenous New Bag/Given 07/11/22 1729)  pantoprazole (PROTONIX) injection 80 mg (80 mg Intravenous Given 07/11/22 1717)    ED Course/ Medical Decision Making/ A&P Clinical Course as of 07/11/22 1905  Fri Jul 11, 2022  1646 Patient here for low hemoglobin, has been having increased shortness of breath though this has been present since December and epigastric and chest pain also present since December.  Troponin is elevated but much lower than when she was discharged in February, discussed with cardiology who states they would not do any intervention even if patient developed ACS in the setting of her GI bleeding.  They feel comfortable with her staying in any pain over the weekend for further GI workup and blood transfusion [CB]    Clinical Course User Index [CB] Ma Rings, PA-C                             Medical Decision Making This patient presents to the ED for concern of low hemoglobin, black stools, this involves an extensive number of treatment options, and is a complaint that carries with it a high risk of complications and morbidity.  The differential diagnosis includes peptic ulcer disease, GAVE syndrome, G varices, GI bleeding, coagulopathy, other   Co morbidities that complicate the patient evaluation  History of GAVE syndrome,   Additional history obtained:  Additional history obtained from EMR External records from outside source obtained and reviewed including admission for bleeding in February   Lab Tests:  I Ordered, and personally  interpreted labs.  The pertinent results include: CBC shows no leukocytosis, globin 6.1, CMP shows mild hyponatremia, CO2 19, bilirubin somewhat elevated at 2.7, creatinine 1.4   Cardiac Monitoring: / EKG:  The patient was maintained on a cardiac monitor.  I personally viewed and interpreted the cardiac monitored which showed an underlying rhythm of: Sinus rhythm   Consultations Obtained: Consulted with Dr. Jenene Slicker regarding elevated troponin, she does not feel patient needs to come to [facility], they will follow-up outpatient.  Discussed with GI-Dr. Levon Hedger regarding her GI bleeding, advised PPI and hospitalist admission, hold Plavix   Problem List / ED Course / Critical interventions / Medication management  And here with low hemoglobin has been having chronic shortness of breath and chest pain that is unchanged.  EKG is unchanged, troponin is lower than her last baseline, no significant elevation at 2-hour repeat.  She is anemic, blood transfusion is ongoing, likely from her GAVE syndrome.  80 mg of Protonix was given in the ED.  Discussed with cardiology and GI as above, awaiting consult from hospitalist. I ordered medication including protonix for GI bleeding  Reevaluation of the patient after these medicines showed that the patient stayed the same I have reviewed the patients home medicines and have made adjustments as needed    Discussed with Dr. Adrian Blackwater who will amdit  This chart has been completed using Dragon Medical Dictation software, and while attempts have been made to ensure accuracy, certain words and phrases may not be transcribed as intended.    Amount and/or Complexity of Data Reviewed Labs: ordered.  Risk Prescription drug management.           Final Clinical Impression(s) / ED Diagnoses Final diagnoses:  Symptomatic anemia  Gastrointestinal hemorrhage, unspecified gastrointestinal hemorrhage type    Rx / DC Orders ED Discharge Orders     None  Ma Rings, [place] 07/11/22 1906

## 2022-07-12 ENCOUNTER — Inpatient Hospital Stay (HOSPITAL_COMMUNITY): Payer: Medicare HMO

## 2022-07-12 DIAGNOSIS — K7460 Unspecified cirrhosis of liver: Secondary | ICD-10-CM | POA: Diagnosis not present

## 2022-07-12 DIAGNOSIS — R7989 Other specified abnormal findings of blood chemistry: Secondary | ICD-10-CM | POA: Diagnosis not present

## 2022-07-12 DIAGNOSIS — K31819 Angiodysplasia of stomach and duodenum without bleeding: Secondary | ICD-10-CM

## 2022-07-12 DIAGNOSIS — D62 Acute posthemorrhagic anemia: Secondary | ICD-10-CM | POA: Diagnosis not present

## 2022-07-12 DIAGNOSIS — R079 Chest pain, unspecified: Secondary | ICD-10-CM

## 2022-07-12 LAB — GLUCOSE, CAPILLARY
Glucose-Capillary: 163 mg/dL — ABNORMAL HIGH (ref 70–99)
Glucose-Capillary: 192 mg/dL — ABNORMAL HIGH (ref 70–99)
Glucose-Capillary: 159 mg/dL — ABNORMAL HIGH (ref 70–99)
Glucose-Capillary: 186 mg/dL — ABNORMAL HIGH (ref 70–99)

## 2022-07-12 LAB — TROPONIN I (HIGH SENSITIVITY)
Troponin I (High Sensitivity): 511 ng/L (ref <18)
Troponin I (High Sensitivity): 806 ng/L (ref <18)
Troponin I (High Sensitivity): 905 ng/L (ref <18)
Troponin I (High Sensitivity): 992 ng/L (ref <18)
Troponin I (High Sensitivity): 982 ng/L (ref <18)
Troponin I (High Sensitivity): 807 ng/L (ref <18)
Troponin I (High Sensitivity): 647 ng/L (ref <18)

## 2022-07-12 LAB — BASIC METABOLIC PANEL
Anion gap: 7 (ref 5–15)
BUN: 21 mg/dL (ref 8–23)
CO2: 19 mmol/L — ABNORMAL LOW (ref 22–32)
Calcium: 8.6 mg/dL — ABNORMAL LOW (ref 8.9–10.3)
Chloride: 106 mmol/L (ref 98–111)
Creatinine, Ser: 1.35 mg/dL — ABNORMAL HIGH (ref 0.44–1.00)
GFR, Estimated: 40 mL/min — ABNORMAL LOW (ref >60)
Glucose, Bld: 175 mg/dL — ABNORMAL HIGH (ref 70–99)
Potassium: 3.9 mmol/L (ref 3.5–5.1)
Sodium: 132 mmol/L — ABNORMAL LOW (ref 135–145)

## 2022-07-12 LAB — ECHOCARDIOGRAM COMPLETE
Area-P 1/2: 5.02 cm2
MV M vel: 4.97 m/s
MV Peak grad: 98.8 mmHg
S' Lateral: 2.8 cm

## 2022-07-12 LAB — CBC
HCT: 25.8 % — ABNORMAL LOW (ref 36.0–46.0)
Hemoglobin: 8.7 g/dL — ABNORMAL LOW (ref 12.0–15.0)
MCH: 29.7 pg (ref 26.0–34.0)
MCHC: 33.7 g/dL (ref 30.0–36.0)
MCV: 88.1 fL (ref 80.0–100.0)
Platelets: 234 10*3/uL (ref 150–400)
RBC: 2.93 MIL/uL — ABNORMAL LOW (ref 3.87–5.11)
RDW: 15.5 % (ref 11.5–15.5)
WBC: 9.5 10*3/uL (ref 4.0–10.5)
nRBC: 0 % (ref 0.0–0.2)

## 2022-07-12 LAB — PROTIME-INR
INR: 1.4 — ABNORMAL HIGH (ref 0.8–1.2)
Prothrombin Time: 17 seconds — ABNORMAL HIGH (ref 11.4–15.2)

## 2022-07-12 LAB — HEMOGLOBIN AND HEMATOCRIT, BLOOD
HCT: 25.6 % — ABNORMAL LOW (ref 36.0–46.0)
Hemoglobin: 8.5 g/dL — ABNORMAL LOW (ref 12.0–15.0)

## 2022-07-12 MED ORDER — PHENOL 1.4 % MT LIQD: 1.0000 | OROMUCOSAL | Status: DC | PRN

## 2022-07-12 MED ORDER — LORATADINE 10 MG PO TABS
10.0000 mg | ORAL_TABLET | Freq: Every day | ORAL | Status: DC
  Administered 2022-07-12 – 2022-07-17 (×5): 10 mg via ORAL
  Filled 2022-07-12 (×6): qty 1

## 2022-07-12 NOTE — Progress Notes (Signed)
   07/12/22 2035  Assess: if the MEWS score is Yellow or Red  Were vital signs taken at a resting state? Yes  Focused Assessment No change from prior assessment  Does the patient meet 2 or more of the SIRS criteria? Yes  Does the patient have a confirmed or suspected source of infection? No  MEWS guidelines implemented  Yes, yellow  Treat  MEWS Interventions Considered administering scheduled or prn medications/treatments as ordered  Take Vital Signs  Increase Vital Sign Frequency  Yellow: Q2hr x1, continue Q4hrs until patient remains green for 12hrs  Escalate  MEWS: Escalate Yellow: Discuss with charge nurse and consider notifying provider and/or RRT  Notify: Charge Nurse/RN  Name of Charge Nurse/RN Notified [name], RN   PRN Tylenol 650mg  given, will continue to monitor.

## 2022-07-12 NOTE — Progress Notes (Signed)
Dr. Sherryll Burger notified of troponin level of 992.

## 2022-07-12 NOTE — Consult Note (Signed)
[name], M.D. Gastroenterology & Hepatology                                           Patient Name: Jessica Torres Account #: @FLAACCTNO @   MRN: [id number] Admission Date: 07/11/2022 Date of Evaluation:  07/12/2022 Time of Evaluation: 7:52 AM   Referring Physician: Maurilio Lovely, DO  Chief Complaint: Melena, anemia  HPI:  This is a 82 y.o. female with history of hyperlipidemia, hypertension, hypothyroidism, GERD, arthritis, diabetes, stroke, peripheral arterial disease on Plavix, uterine cancer and history of GAVE, who came to the hospital after presenting melena, worsening anemia and fatigue.   Patient reports a chronic history of melena since December 2023 which has been intermittent as she has a history of constipation.  She reports that she can have a bowel movement 1-2 times per week but is usually black in color.  Has been feeling very fatigued and tired.  Had recent blood testing performed at her PCPs office and was told her hemoglobin was in the 4 range.  Due to this she was advised to come to the ER for further evaluation.  The patient denies having any fever, chills, abdominal distention, rectal bleeding, nausea or vomiting.  Has presented some generalized abdominal pain but reports she has also presented a chronic history of chest pain since December which has exacerbated for the last few days.  Notably, she had an episode of diverticulitis in mid March 2024, for which she was given a prescription for ciprofloxacin and Flagyl in the ER at [facility].  Last Plavix dose was 2 days ago, currently taking this for peripheral vascular disease and stent placement.  Notably, the patient was admitted to [facility] in April 27, 2022.  At that time she was presented similar symptoms.  Also developed type II NSTEMI, for which she received pharmacologic management.  Patient initial esophagogastroduodenospy and was found to have changes suggestive of GAVE.  Washout of Plavix  was achieved after total 5 days and repeat EGD was performed on 04/30/2022 by Dr. Orvan Falconer.  She had ablation with APC of GAVE.  In the ED, she was HD stable and afebrile. Labs were remarkable for hemoglobin of 6.1 with MCV 91, platelets 270 and WBC 8.1, CMP showed sodium 132, potassium 4.1, creatinine 1.4, BUN 22, AST 34, ALT 13, total bilirubin 2.7, albumin 2.9 and alkaline phosphatase 59.  Troponins were initially elevated 252 And have increased since then with most recent result of 905.  He was discharged on PPI and ferrous sulfate.  Last EGD: as above Last Colonoscopy: 4 small polyps in the transverse colon, left-sided diverticulosis and internal hemorrhoids. No known history of previous upper endoscopy.  Past Medical History: SEE CHRONIC ISSSUES: Past Medical History:  Diagnosis Date   Arthritis    "fingers probably" (10/30/2016)   Bulging lumbar disc    Chronic lower back pain    Difficulty urinating    GERD (gastroesophageal reflux disease)    Heart murmur    hx   Hyperlipidemia    statin intolerant   Hypertension    Hypothyroidism    PAD (peripheral artery disease) (HCC)    Peripheral arterial disease (HCC)    Shortness of breath dyspnea    Sleep apnea    "dx'd years ago; had severe problems; all of the sudden it just went away" (07/06/2014) *still gone" (10/30/2016)  Stroke [facility]) ~ 2010   denies residual on 10/30/2016   Type 2 diabetes mellitus (HCC)    Urgency of urination    Uterine cancer [facility])    Past Surgical History:  Past Surgical History:  Procedure Laterality Date   ANGIOPLASTY Right 01/22/2015   w/ atherectomy R sfa   BALLOON ANGIOPLASTY, ARTERY Right    SFA/notes 07/06/2014   BIOPSY  04/28/2022   Procedure: BIOPSY;  Surgeon: Tressia Danas, MD;  Location: [facility] ENDOSCOPY;  Service: Gastroenterology;;   BREAST EXCISIONAL BIOPSY Left    BREAST SURGERY     CARPAL TUNNEL RELEASE Right 1980's   COLONOSCOPY WITH PROPOFOL N/A 11/26/2020   Procedure: COLONOSCOPY  WITH PROPOFOL;  Surgeon: Lanelle Bal, DO;  Location: [facility];  Service: Endoscopy;  Laterality: N/A;  9:00am, pt cannot come in earlier   DILATION AND CURETTAGE OF UTERUS     ESOPHAGOGASTRODUODENOSCOPY N/A 04/28/2022   Procedure: ESOPHAGOGASTRODUODENOSCOPY (EGD);  Surgeon: Tressia Danas, MD;  Location: [facility] ENDOSCOPY;  Service: Gastroenterology;  Laterality: N/A;   ESOPHAGOGASTRODUODENOSCOPY (EGD) WITH PROPOFOL N/A 04/30/2022   Procedure: ESOPHAGOGASTRODUODENOSCOPY (EGD) WITH PROPOFOL;  Surgeon: Tressia Danas, MD;  Location: [facility] ENDOSCOPY;  Service: Gastroenterology;  Laterality: N/A;  With APC   HEMORRHOID SURGERY     "dr lanced it; in dr's office"   HOT HEMOSTASIS N/A 04/30/2022   Procedure: HOT HEMOSTASIS (ARGON PLASMA COAGULATION/BICAP);  Surgeon: Tressia Danas, MD;  Location: [facility] ENDOSCOPY;  Service: Gastroenterology;  Laterality: N/A;   INCISION AND DRAINAGE BREAST ABSCESS Left    LOWER EXTREMITY ANGIOGRAM N/A 07/06/2014   Procedure: LOWER EXTREMITY ANGIOGRAM & R-SFA atherectomy and balloon angioplasty;  Surgeon: Runell Gess, MD;  Location: [facility] CATH LAB;  Service: Cardiovascular;  Laterality: R   LOWER EXTREMITY ANGIOGRAM  10/30/2016   LOWER EXTREMITY ANGIOGRAPHY N/A 10/30/2016   Procedure: Lower Extremity Angiography;  Surgeon: Runell Gess, MD;  Location: [facility] INVASIVE CV LAB;  Service: Cardiovascular;  Laterality: N/A;   PERIPHERAL VASCULAR CATHETERIZATION N/A 08/17/2014   Procedure: Lower Extremity Angiography;  Surgeon: Runell Gess, MD;  Location: [facility] INVASIVE CV LAB;  Service: Cardiovascular;  Laterality: N/A;   PERIPHERAL VASCULAR CATHETERIZATION Left 08/17/2014   Procedure: Peripheral Vascular Atherectomy and Viabahn stent L-SFA;  Surgeon: Runell Gess, MD; Laterality: Left; L-SFA   PERIPHERAL VASCULAR CATHETERIZATION N/A 01/11/2015   Procedure: Lower Extremity Angiography;  Surgeon: Runell Gess, MD;  Location: [facility] INVASIVE CV LAB;  Service:  Cardiovascular;  Laterality: N/A;   PERIPHERAL VASCULAR CATHETERIZATION Right 01/22/2015   Procedure: Peripheral Vascular Atherectomy;  Surgeon: Runell Gess, MD;  Location: [facility] INVASIVE CV LAB;  Service: Cardiovascular;  Laterality: Right;   POLYPECTOMY  11/26/2020   Procedure: POLYPECTOMY;  Surgeon: Lanelle Bal, DO;  Location: [facility] ENDO [facility];  Service: Endoscopy;;   TUBAL LIGATION     VAGINAL HYSTERECTOMY     Family History:  Family History  Problem Relation Age of Onset   Heart attack Mother    Heart disease Mother    Arrhythmia Mother        has PPM   Heart disease Father    Diabetes Sister    Breast cancer Neg Hx    Social History:  Social History   Tobacco Use   Smoking status: Never   Smokeless tobacco: Never  Vaping Use   Vaping Use: Never used  Substance Use Topics   Alcohol use: Yes    Alcohol/week: 0.0 standard drinks of alcohol    Comment:  10/30/2016  "margarita q  2 wks or so"   Drug use: No    Home Medications:  Prior to Admission medications   Medication Sig Start Date End Date Taking? Authorizing Provider  Accu-Chek Softclix Lancets lancets Test BS TID Dx E11.9 12/02/21   Jannifer Rodney A, FNP  Alcohol Swabs (B-D SINGLE USE SWABS REGULAR) PADS Test BS 4 times daily Dx E11.9 04/02/21   Junie Spencer, FNP  BD PEN NEEDLE NANO 2ND GEN 32G X 4 MM MISC Use twice daily with insulin Dx E11.9 11/25/21   Junie Spencer, FNP  cilostazol (PLETAL) 50 MG tablet Take 1 tablet (50 mg total) by mouth 2 (two) times daily. Patient not taking: Reported on 04/27/2022 07/25/20   Runell Gess, MD  clopidogrel (PLAVIX) 75 MG tablet TAKE 1 TABLET DAILY 04/02/22   Jannifer Rodney A, FNP  empagliflozin (JARDIANCE) 25 MG TABS tablet Take 25 mg by mouth as needed (low blood sugar).    [provider]  ezetimibe (ZETIA) 10 MG tablet Take 1 tablet (10 mg total) by mouth daily. 08/22/21   Junie Spencer, FNP  fenofibrate 160 MG tablet TAKE 1 TABLET DAILY 04/02/22    Jannifer Rodney A, FNP  ferrous sulfate 325 (65 FE) MG EC tablet Take 1 tablet (325 mg total) by mouth every Monday, Wednesday, and Friday. 04/30/22 06/25/22  Zigmund ., MD  glucose blood (FREESTYLE LITE) test strip Test BS TID Dx E11.9 11/25/21   Jannifer Rodney A, FNP  insulin glargine, 2 Unit Dial, (TOUJEO MAX SOLOSTAR) 300 UNIT/ML Solostar Pen INJECT 52 UNITS UNDER THE SKIN DAILY Patient taking differently: Inject 52 Units into the skin at bedtime. 03/18/22   Jannifer Rodney A, FNP  insulin lispro (HUMALOG) 100 UNIT/ML KwikPen Inject 10-20 Units into the skin 3 (three) times daily. Patient taking differently: Inject 20-22 Units into the skin at bedtime. 08/22/21   Junie Spencer, FNP  levothyroxine (SYNTHROID) 88 MCG tablet TAKE 1 TABLET DAILY BEFORE BREAKFAST Patient taking differently: Take 88 mcg by mouth daily before breakfast. 04/24/22   Jannifer Rodney A, FNP  pantoprazole (PROTONIX) 40 MG tablet Take 1 tablet (40 mg total) by mouth 2 (two) times daily. Follow up with gastroenterology for additional recommendations 04/30/22 06/29/22  Zigmund ., MD  Semaglutide,0.25 or 0.5MG /DOS, 2 MG/3ML SOPN Inject 0.25 mg into the skin once a week. Patient not taking: Reported on 04/27/2022 03/14/22   Junie Spencer, FNP  metFORMIN (GLUCOPHAGE) 1000 MG tablet Take 1 tablet (1,000 mg total) by mouth 2 (two) times daily with a meal. Patient taking differently: Take 1,000 mg by mouth 2 (two) times daily with a meal. Only taking once a day 04/06/19 07/13/19  Remus Loffler, PA-C    Inpatient Medications:  Current Facility-Administered Medications:    acetaminophen (TYLENOL) tablet 650 mg, 650 mg, Oral, Q6H PRN, Adefeso, Oladapo, DO, 650 mg at 07/11/22 2246   insulin aspart (novoLOG) injection 0-15 Units, 0-15 Units, Subcutaneous, TID WC, Stinson, Jacob J, DO   insulin aspart (novoLOG) injection 0-5 Units, 0-5 Units, Subcutaneous, QHS, Stinson, Jacob J, DO   insulin aspart (novoLOG) injection 10  Units, 10 Units, Subcutaneous, TID WC, Stinson, Jacob J, DO   insulin glargine-yfgn (SEMGLEE) injection 52 Units, 52 Units, Subcutaneous, QHS, Stinson, Jacob J, DO   levothyroxine (SYNTHROID) tablet 88 mcg, 88 mcg, Oral, QAC breakfast, Levie Heritage, DO, 88 mcg at 07/12/22 0551   ondansetron (ZOFRAN) tablet 4 mg, 4 mg,  Oral, Q6H PRN **OR** ondansetron (ZOFRAN) injection 4 mg, 4 mg, Intravenous, Q6H PRN, Levie Heritage, DO   pantoprazole (PROTONIX) injection 40 mg, 40 mg, Intravenous, Q12H, Stinson, Jacob J, DO Allergies: Morphine and related, Latex, and Statins  Complete Review of Systems: GENERAL: negative for malaise, night sweats HEENT: No changes in hearing or vision, no nose bleeds or other nasal problems. NECK: Negative for lumps, goiter, pain and significant neck swelling RESPIRATORY: Negative for cough, wheezing CARDIOVASCULAR: Negative for chest pain, leg swelling, palpitations, orthopnea GI: SEE HPI MUSCULOSKELETAL: Negative for joint pain or swelling, back pain, and muscle pain. SKIN: Negative for lesions, rash PSYCH: Negative for sleep disturbance, mood disorder and recent psychosocial stressors. HEMATOLOGY Negative for prolonged bleeding, bruising easily, and swollen nodes. ENDOCRINE: Negative for cold or heat intolerance, polyuria, polydipsia and goiter. NEURO: negative for tremor, gait imbalance, syncope and seizures. The remainder of the review of systems is noncontributory.  Physical Exam: BP (!) 120/54 (BP Location: Left Arm)   Pulse 98   Temp 98.7 F (37.1 C) (Oral)   Resp 20   SpO2 94%  GENERAL: The patient is AO x3, in no acute distress. HEENT: Head is normocephalic and atraumatic. EOMI are intact. Mouth is well hydrated and without lesions. NECK: Supple. No masses LUNGS: Clear to auscultation. No presence of rhonchi/wheezing/rales. Adequate chest expansion HEART: RRR, normal s1 and s2. ABDOMEN: nontender, no guarding, no peritoneal signs, and nondistended.  BS +. No masses. EXTREMITIES: Without any cyanosis, clubbing, rash, lesions or edema. NEUROLOGIC: AOx3, no focal motor deficit. SKIN: no jaundice, no rashes  Laboratory Data CBC:     Component Value Date/Time   WBC 9.5 07/12/2022 0245   RBC 2.93 (L) 07/12/2022 0245   HGB 8.7 (L) 07/12/2022 0245   HGB 11.9 08/22/2021 1131   HCT 25.8 (L) 07/12/2022 0245   HCT 37.5 08/22/2021 1131   PLT 234 07/12/2022 0245   PLT 255 08/22/2021 1131   MCV 88.1 07/12/2022 0245   MCV 96 08/22/2021 1131   MCH 29.7 07/12/2022 0245   MCHC 33.7 07/12/2022 0245   RDW 15.5 07/12/2022 0245   RDW 12.6 08/22/2021 1131   LYMPHSABS 1.6 07/11/2022 1521   LYMPHSABS 1.5 08/22/2021 1131   MONOABS 1.0 07/11/2022 1521   EOSABS 0.1 07/11/2022 1521   EOSABS 0.3 08/22/2021 1131   BASOSABS 0.1 07/11/2022 1521   BASOSABS 0.1 08/22/2021 1131   COAG:  Lab Results  Component Value Date   INR 1.4 (H) 07/11/2022   INR 1.3 (H) 04/27/2022   INR 1.0 10/20/2016    BMP:     Latest Ref Rng & Units 07/12/2022    2:45 AM 07/11/2022    3:21 PM 04/30/2022    3:46 AM  BMP  Glucose 70 - 99 mg/dL 191  478  295   BUN 8 - 23 mg/dL 21  22  18    Creatinine 0.44 - 1.00 mg/dL 6.21  3.08  6.57   Sodium 135 - 145 mmol/L 132  132  133   Potassium 3.5 - 5.1 mmol/L 3.9  4.1  4.2   Chloride 98 - 111 mmol/L 106  104  104   CO2 22 - 32 mmol/L 19  19  21    Calcium 8.9 - 10.3 mg/dL 8.6  9.3  9.5     HEPATIC:     Latest Ref Rng & Units 07/11/2022    3:21 PM 02/21/2022   11:11 AM 11/28/2021   10:40 AM  Hepatic Function  Total Protein 6.5 - 8.1 g/dL 6.7  6.7  6.6   Albumin 3.5 - 5.0 g/dL 2.9  3.4  3.6   AST 15 - 41 U/L 34  45  53   ALT 0 - 44 U/L 13  12  18    Alk Phosphatase 38 - 126 U/L 56  89  83   Total Bilirubin 0.3 - 1.2 mg/dL 2.7  0.9  0.6     CARDIAC:  Lab Results  Component Value Date   CKTOTAL 81 01/19/2015     Imaging: I personally reviewed and interpreted the available imaging.  Assessment & Plan: RAVAE SHOWERS is a  82 y.o. female with history of hyperlipidemia, hypertension, hypothyroidism, GERD, arthritis, diabetes, stroke, peripheral arterial disease on Plavix, uterine cancer and history of GAVE, who came to the hospital after presenting melena, worsening anemia and fatigue.  Patient has presented intermittent episodes of melena for multiple months.  Had relatively recent endoscopic investigations that showed abnormalities concerning for GAVE which was successfully ablated x 1.  However, her symptoms persisted and she had recurrence of severe iron deficiency anemia.  She has responded to blood transfusion adequately.  We discussed in detail that we will need to investigate her symptoms further with an EGD but we will also need for Plavix washout.  Potentially, we will proceed with EGD on 07/15/2022.  However, I have requested the hospitalist to obtain cardiology clearance given her rising troponins and recent history of NSTEMI.  For now, we will continue with PPI twice daily    Notably, upon review of  the reports from previous CT of the abdomen pelvis with and without IV contrast from 05/25/2022, she had an earlier hepatic tour and presence of ascites.  She also has impression of her AST to ALT ratio and elevation of her INR to 1.4.  This is concerning for  cirrhosis.  She did not history of alcohol intake.  Will check serologies to evaluate for other etiologies of liver cirrhosis.  She has not presented any decompensating events,  But GAVE could be associated to her liver disease.  - Repeat CBC qday, transfuse if Hb <8 - Pantoprazole 40 mg q12h IVP - 2 large bore IV lines - Active T/S - GI soft diet - Continue holding Plavix - Avoid NSAIDs - Will proceed with EGD on 07/15/2022 if cleared by cardiology - Check hepatitis A/B/C serologies, ANA, AMA, ASMA, IgG, A1AT  [name], MD Gastroenterology and Hepatology [facility] Gastroenterology

## 2022-07-12 NOTE — Progress Notes (Signed)
PROGRESS NOTE    Jessica Torres  WUJ:[id number] DOB: 07/24/1940 DOA: 07/11/2022 PCP: Rebekah Chesterfield, NP   Brief Narrative:    Jessica Torres is a 82 y.o. female with medical history significant of peripheral artery disease on Plavix, GAVE, GERD, history of stroke, diabetes, hypertension, hypothyroidism.  Patient was sent to the emergency department by her PCP after blood work yesterday showed acute anemia with a reported hemoglobin of 4 point something.  She was noted to have hemoglobin of 6.1 and required 2 unit PRBC transfusion with improvement to 8.7 with stability noted.  She has significant troponin elevation likely in the setting of repeat type II NSTEMI in the setting of demand ischemia.  2D echocardiogram ordered and pending.  Assessment & Plan:   Principal Problem:   Acute blood loss anemia Active Problems:   NSTEMI (non-ST elevated myocardial infarction) (HCC)   Heart failure with preserved ejection fraction (HCC)   DM (diabetes mellitus) (HCC)   Hypertension associated with diabetes (HCC)   Chronic kidney disease, stage III (moderate) (HCC)   Peripheral arterial disease - Bilateral SFA    Hypothyroidism   Coronary artery disease due to lipid rich plaque   GAVE (gastric antral vascular ectasia)  Assessment and Plan:  Acute blood loss anemia second to GAVE and GERD Protonix GI consulted with recommendations for cardiology clearance prior to EGD anticipated 5/6 Stopped Plavix Patient transfused 2 units, repeat transfusion for hemoglobin less than 8 Check CBC in the morning Started on full liquid diet Diabetes Continue long-acting insulin, short acting insulin Hold short acting insulin while n.p.o. Hold Januvia Sliding scale insulin CBGs before meals and nightly History of NSTEMI with elevated troponins, hypertension, coronary artery disease, HFpEF Troponins elevated -likely secondary to cardiac strain with a recent NSTEMI and acute blood loss anemia Transfuse to  keep hemoglobin above 8 Follow troponins every 6 hours 2D echocardiogram ordered and pending Peripheral artery disease Holding Plavix currently Chronic kidney disease stage III Appears to be at baseline We will follow creatinine Hypothyroidism Continue levothyroxine  DVT prophylaxis:SCDs Code Status: Full Family Communication: None at bedside Disposition Plan:  Status is: Inpatient Remains inpatient appropriate because: Need for IV medications.   Consultants:  GI  Procedures:  None  Antimicrobials:  None   Subjective: Patient seen and evaluated today with no new acute complaints or concerns. No acute concerns or events noted overnight.  She denies any abdominal pain, chest pain, or shortness of breath.  Objective: Vitals:   07/11/22 2152 07/12/22 0028 07/12/22 0504 07/12/22 0558  BP: 126/61 (!) 126/55 (!) 113/41 (!) 120/54  Pulse: (!) 109 (!) 107 97 98  Resp: 20 20 20 20   Temp: 99.1 F (37.3 C) 98.6 F (37 C) 99 F (37.2 C) 98.7 F (37.1 C)  TempSrc: Oral Oral Oral Oral  SpO2: 91% 94% 95% 94%    Intake/Output Summary (Last 24 hours) at 07/12/2022 0725 Last data filed at 07/12/2022 9562 Gross per 24 hour  Intake 630 ml  Output 200 ml  Net 430 ml   There were no vitals filed for this visit.  Examination:  General exam: Appears calm and comfortable  Respiratory system: Clear to auscultation. Respiratory effort normal.  Nasal cannula oxygen Cardiovascular system: S1 & S2 heard, RRR.  Gastrointestinal system: Abdomen is soft Central nervous system: Alert and awake Extremities: No edema Skin: No significant lesions noted Psychiatry: Flat affect.    Data Reviewed: I have personally reviewed following labs and imaging studies  CBC:  Recent Labs  Lab 07/11/22 1521 07/12/22 0245  WBC 8.1 9.5  NEUTROABS 5.3  --   HGB 6.1* 8.7*  HCT 19.2* 25.8*  MCV 91.0 88.1  PLT 270 234   Basic Metabolic Panel: Recent Labs  Lab 07/11/22 1521 07/12/22 0245  NA  132* 132*  K 4.1 3.9  CL 104 106  CO2 19* 19*  GLUCOSE 191* 175*  BUN 22 21  CREATININE 1.40* 1.35*  CALCIUM 9.3 8.6*   GFR: CrCl cannot be calculated (Unknown ideal weight.). Liver Function Tests: Recent Labs  Lab 07/11/22 1521  AST 34  ALT 13  ALKPHOS 56  BILITOT 2.7*  PROT 6.7  ALBUMIN 2.9*   Recent Labs  Lab 07/11/22 1521  LIPASE 35   No results for input(s): "AMMONIA" in the last 168 hours. Coagulation Profile: Recent Labs  Lab 07/11/22 1521  INR 1.4*   Cardiac Enzymes: No results for input(s): "CKTOTAL", "CKMB", "CKMBINDEX", "TROPONINI" in the last 168 hours. BNP (last 3 results) No results for input(s): "PROBNP" in the last 8760 hours. HbA1C: No results for input(s): "HGBA1C" in the last 72 hours. CBG: Recent Labs  Lab 07/11/22 2017 07/11/22 2155 07/12/22 0723  GLUCAP 173* 149* 163*   Lipid Profile: No results for input(s): "CHOL", "HDL", "LDLCALC", "TRIG", "CHOLHDL", "LDLDIRECT" in the last 72 hours. Thyroid Function Tests: No results for input(s): "TSH", "T4TOTAL", "FREET4", "T3FREE", "THYROIDAB" in the last 72 hours. Anemia Panel: No results for input(s): "VITAMINB12", "FOLATE", "FERRITIN", "TIBC", "IRON", "RETICCTPCT" in the last 72 hours. Sepsis Labs: No results for input(s): "PROCALCITON", "LATICACIDVEN" in the last 168 hours.  No results found for this or any previous visit (from the past 240 hour(s)).       Radiology Studies: No results found.      Scheduled Meds:  insulin aspart  0-15 Units Subcutaneous TID WC   insulin aspart  0-5 Units Subcutaneous QHS   insulin aspart  10 Units Subcutaneous TID WC   insulin glargine-yfgn  52 Units Subcutaneous QHS   levothyroxine  88 mcg Oral QAC breakfast   pantoprazole (PROTONIX) IV  40 mg Intravenous Q12H     LOS: 1 day    Time spent: 35 minutes    Jessica Guymon Hoover Brunette, DO [facility]  If 7PM-7AM, please contact night-coverage [email or web address] 07/12/2022, 7:25 AM

## 2022-07-12 NOTE — Progress Notes (Signed)
Pt. Complained of generalized body pain and abdominal pain earlier on the shift. PRN pain medication given. Later. went back to reassess pain, pt. States that medication was effective. Pt. Lying in bed resting with eyes closed. No further complaints or concerns verbalized from patient from the rest of the shift or night. Will continue to monitor and report off to oncoming shift.

## 2022-07-12 NOTE — Progress Notes (Signed)
  Echocardiogram 2D Echocardiogram has been performed.  Delcie Roch 07/12/2022, 2:06 PM

## 2022-07-12 NOTE — TOC Progression Note (Signed)
Transition of Care [facility]) - Progression Note    Patient Details  Name: FELESHA ROBBS MRN: [id number] Date of Birth: 1940-04-03  Transition of Care [facility]) CM/SW Contact  Catalina Gravel, [place] Phone Number: 07/12/2022, 1:36 PM  Clinical Narrative:    .Marland Kitchen Transition of Care [facility]) Screening Note   Patient Details  Name: LATICE SHELLENBERGER Date of Birth: 1941-01-21   Transition of Care [facility]) CM/SW Contact:    Catalina Gravel, LCSW Phone Number: 07/12/2022, 1:37 PM    Transition of Care Department [facility]) has reviewed patient and no TOC needs have been identified at this time. We will continue to monitor patient advancement through interdisciplinary progression rounds. If new patient transition needs arise, please place a TOC consult.       Barriers to Discharge: Continued Medical Work up  Expected Discharge Plan and Services                                               Social Determinants of Health (SDOH) Interventions SDOH Screenings   Food Insecurity: No Food Insecurity (04/27/2022)  Housing: Low Risk  (04/27/2022)  Transportation Needs: No Transportation Needs (04/27/2022)  Utilities: Not At Risk (04/27/2022)  Alcohol Screen: Low Risk  (09/25/2020)  Depression (PHQ2-9): Medium Risk (02/21/2022)  Financial Resource Strain: Low Risk  (09/25/2020)  Physical Activity: Inactive (09/25/2020)  Social Connections: Moderately Integrated (09/25/2020)  Stress: Stress Concern Present (09/25/2020)  Tobacco Use: Low Risk  (07/11/2022)    Readmission Risk Interventions     No data to display

## 2022-07-13 DIAGNOSIS — D62 Acute posthemorrhagic anemia: Secondary | ICD-10-CM | POA: Diagnosis not present

## 2022-07-13 DIAGNOSIS — K922 Gastrointestinal hemorrhage, unspecified: Secondary | ICD-10-CM

## 2022-07-13 DIAGNOSIS — K31819 Angiodysplasia of stomach and duodenum without bleeding: Secondary | ICD-10-CM | POA: Diagnosis not present

## 2022-07-13 DIAGNOSIS — K7290 Hepatic failure, unspecified without coma: Secondary | ICD-10-CM

## 2022-07-13 DIAGNOSIS — K7460 Unspecified cirrhosis of liver: Secondary | ICD-10-CM

## 2022-07-13 LAB — CBC
HCT: 26.4 % — ABNORMAL LOW (ref 36.0–46.0)
Hemoglobin: 8.7 g/dL — ABNORMAL LOW (ref 12.0–15.0)
MCH: 29.3 pg (ref 26.0–34.0)
MCHC: 33 g/dL (ref 30.0–36.0)
MCV: 88.9 fL (ref 80.0–100.0)
Platelets: 232 10*3/uL (ref 150–400)
RBC: 2.97 MIL/uL — ABNORMAL LOW (ref 3.87–5.11)
RDW: 15.7 % — ABNORMAL HIGH (ref 11.5–15.5)
WBC: 8 10*3/uL (ref 4.0–10.5)
nRBC: 0 % (ref 0.0–0.2)

## 2022-07-13 LAB — BPAM RBC
Blood Product Expiration Date: 202405312359
ISSUE DATE / TIME: 202405031659
Unit Type and Rh: 9500

## 2022-07-13 LAB — BASIC METABOLIC PANEL
Anion gap: 6 (ref 5–15)
BUN: 20 mg/dL (ref 8–23)
CO2: 21 mmol/L — ABNORMAL LOW (ref 22–32)
Calcium: 8.7 mg/dL — ABNORMAL LOW (ref 8.9–10.3)
Chloride: 104 mmol/L (ref 98–111)
Creatinine, Ser: 1.3 mg/dL — ABNORMAL HIGH (ref 0.44–1.00)
GFR, Estimated: 42 mL/min — ABNORMAL LOW (ref >60)
Glucose, Bld: 161 mg/dL — ABNORMAL HIGH (ref 70–99)
Potassium: 3.6 mmol/L (ref 3.5–5.1)
Sodium: 131 mmol/L — ABNORMAL LOW (ref 135–145)

## 2022-07-13 LAB — TYPE AND SCREEN
ABO/RH(D): O NEG
Antibody Screen: NEGATIVE
Unit division: 0
Unit division: 0

## 2022-07-13 LAB — GLUCOSE, CAPILLARY
Glucose-Capillary: 117 mg/dL — ABNORMAL HIGH (ref 70–99)
Glucose-Capillary: 141 mg/dL — ABNORMAL HIGH (ref 70–99)
Glucose-Capillary: 188 mg/dL — ABNORMAL HIGH (ref 70–99)
Glucose-Capillary: 217 mg/dL — ABNORMAL HIGH (ref 70–99)
Glucose-Capillary: 85 mg/dL (ref 70–99)

## 2022-07-13 LAB — TROPONIN I (HIGH SENSITIVITY)
Troponin I (High Sensitivity): 677 ng/L (ref <18)
Troponin I (High Sensitivity): 554 ng/L (ref <18)
Troponin I (High Sensitivity): 615 ng/L (ref <18)
Troponin I (High Sensitivity): 619 ng/L (ref <18)

## 2022-07-13 LAB — MAGNESIUM: Magnesium: 1.8 mg/dL (ref 1.7–2.4)

## 2022-07-13 LAB — HEPATITIS PANEL, ACUTE
HCV Ab: NONREACTIVE
Hep A IgM: NONREACTIVE
Hep B C IgM: NONREACTIVE
Hepatitis B Surface Ag: NONREACTIVE

## 2022-07-13 MED ORDER — POLYETHYLENE GLYCOL 3350 17 G PO PACK
17.0000 g | PACK | Freq: Every day | ORAL | Status: DC
  Administered 2022-07-13 – 2022-07-17 (×4): 17 g via ORAL
  Filled 2022-07-13 (×5): qty 1

## 2022-07-13 NOTE — Progress Notes (Signed)
PROGRESS NOTE    Jessica Torres  GNF:[id number] DOB: [birth year] DOA: 07/11/2022 PCP: Rebekah Chesterfield, NP   Brief Narrative:    Jessica Torres is a 82 y.o. female with medical history significant of peripheral artery disease on Plavix, GAVE, GERD, history of stroke, diabetes, hypertension, hypothyroidism.  Patient was sent to the emergency department by her PCP after blood work yesterday showed acute anemia with a reported hemoglobin of 4 point something.  She was noted to have hemoglobin of 6.1 and required 2 unit PRBC transfusion with improvement to 8.7 with stability noted.  She has significant troponin elevation likely in the setting of repeat type II NSTEMI in the setting of demand ischemia.  2D echocardiogram 5/5 with wall motion abnormalities noted.  Discussed case with cardiology Dr. Anne Fu 5/4 who states that patient will require stabilization from GI standpoint prior to any further ischemic evaluation.  Assessment & Plan:   Principal Problem:   Acute blood loss anemia Active Problems:   NSTEMI (non-ST elevated myocardial infarction) (HCC)   Heart failure with preserved ejection fraction (HCC)   DM (diabetes mellitus) (HCC)   Hypertension associated with diabetes (HCC)   Chronic kidney disease, stage III (moderate) (HCC)   Peripheral arterial disease - Bilateral SFA    Hypothyroidism   Coronary artery disease due to lipid rich plaque   GAVE (gastric antral vascular ectasia)  Assessment and Plan:  Acute blood loss anemia second to GAVE and GERD Protonix GI consulted with recommendations for cardiology clearance prior to EGD anticipated 5/6 Stopped Plavix Patient transfused 2 units, repeat transfusion for hemoglobin less than 8, currently stable Check CBC in the morning Started on full liquid diet, but n.p.o. after midnight Diabetes Continue long-acting insulin, short acting insulin Hold short acting insulin while n.p.o. Hold Januvia Sliding scale insulin CBGs before  meals and nightly NSTEMI Transfuse to keep hemoglobin above 8 Follow troponins every 6 hours 2D echocardiogram with wall motion abnormalities noted 5/4 Appreciate cardiology evaluation in a.m., denies any chest pain Peripheral artery disease Holding Plavix currently Chronic kidney disease stage III Appears to be at baseline We will follow creatinine Hypothyroidism Continue levothyroxine  DVT prophylaxis:SCDs Code Status: Full Family Communication: None at bedside Disposition Plan:  Status is: Inpatient Remains inpatient appropriate because: Need for IV medications.   Consultants:  GI Discussed with Dr. Anne Fu 5/4  Procedures:  None  Antimicrobials:  None   Subjective: Patient seen and evaluated today with no new acute complaints or concerns. No acute concerns or events noted overnight.  She denies any abdominal pain, chest pain, or shortness of breath.  Objective: Vitals:   07/12/22 2307 07/13/22 0029 07/13/22 0433 07/13/22 0742  BP: (!) 110/49 (!) 121/58 (!) 108/58 (!) 116/54  Pulse: 94 96 93 96  Resp: 16 16 18    Temp: 99 F (37.2 C) 98.7 F (37.1 C) 98.3 F (36.8 C)   TempSrc: Oral Oral Oral   SpO2: 94% 100% 95%     Intake/Output Summary (Last 24 hours) at 07/13/2022 1045 Last data filed at 07/13/2022 0900 Gross per 24 hour  Intake 920 ml  Output 900 ml  Net 20 ml   There were no vitals filed for this visit.  Examination:  General exam: Appears calm and comfortable  Respiratory system: Clear to auscultation. Respiratory effort normal.  Nasal cannula oxygen Cardiovascular system: S1 & S2 heard, RRR.  Gastrointestinal system: Abdomen is soft Central nervous system: Alert and awake Extremities: No edema Skin: No significant lesions  noted Psychiatry: Flat affect.    Data Reviewed: I have personally reviewed following labs and imaging studies  CBC: Recent Labs  Lab 07/11/22 1521 07/12/22 0245 07/12/22 0931 07/13/22 0511  WBC 8.1 9.5  --  8.0   NEUTROABS 5.3  --   --   --   HGB 6.1* 8.7* 8.5* 8.7*  HCT 19.2* 25.8* 25.6* 26.4*  MCV 91.0 88.1  --  88.9  PLT 270 234  --  232   Basic Metabolic Panel: Recent Labs  Lab 07/11/22 1521 07/12/22 0245 07/13/22 0511  NA 132* 132* 131*  K 4.1 3.9 3.6  CL 104 106 104  CO2 19* 19* 21*  GLUCOSE 191* 175* 161*  BUN 22 21 20   CREATININE 1.40* 1.35* 1.30*  CALCIUM 9.3 8.6* 8.7*  MG  --   --  1.8   GFR: CrCl cannot be calculated (Unknown ideal weight.). Liver Function Tests: Recent Labs  Lab 07/11/22 1521  AST 34  ALT 13  ALKPHOS 56  BILITOT 2.7*  PROT 6.7  ALBUMIN 2.9*   Recent Labs  Lab 07/11/22 1521  LIPASE 35   No results for input(s): "AMMONIA" in the last 168 hours. Coagulation Profile: Recent Labs  Lab 07/11/22 1521 07/12/22 0819  INR 1.4* 1.4*   Cardiac Enzymes: No results for input(s): "CKTOTAL", "CKMB", "CKMBINDEX", "TROPONINI" in the last 168 hours. BNP (last 3 results) No results for input(s): "PROBNP" in the last 8760 hours. HbA1C: No results for input(s): "HGBA1C" in the last 72 hours. CBG: Recent Labs  Lab 07/12/22 1117 07/12/22 1557 07/12/22 2140 07/13/22 0057 07/13/22 0743  GLUCAP 192* 159* 186* 217* 141*   Lipid Profile: No results for input(s): "CHOL", "HDL", "LDLCALC", "TRIG", "CHOLHDL", "LDLDIRECT" in the last 72 hours. Thyroid Function Tests: No results for input(s): "TSH", "T4TOTAL", "FREET4", "T3FREE", "THYROIDAB" in the last 72 hours. Anemia Panel: No results for input(s): "VITAMINB12", "FOLATE", "FERRITIN", "TIBC", "IRON", "RETICCTPCT" in the last 72 hours. Sepsis Labs: No results for input(s): "PROCALCITON", "LATICACIDVEN" in the last 168 hours.  No results found for this or any previous visit (from the past 240 hour(s)).       Radiology Studies: ECHOCARDIOGRAM COMPLETE  Result Date: 07/12/2022    ECHOCARDIOGRAM REPORT   Patient Name:   Jessica Torres Date of Exam: 07/12/2022 Medical Rec #:  [id number]      Height:        65.0 in Accession #:    [id number]     Weight:       141.5 lb Date of Birth:  [birth year]      BSA:          1.708 m Patient Age:    81 years       BP:           104/49 mmHg Patient Gender: F              HR:           97 bpm. [facility] Procedure: 2D Echo, Color Doppler and Cardiac Doppler Indications:    elevated troponin  History:        Patient has prior history of Echocardiogram examinations, most                 recent 04/28/2022. CAD, PAD and chronic kidney disease,                 Signs/Symptoms:Murmur; Risk Factors:Hypertension, Dyslipidemia,  Diabetes and Sleep Apnea.  Sonographer:    Delcie Roch RDCS Referring Phys: [id number] [place] Tarquinio D [facility] IMPRESSIONS  1. Left ventricular ejection fraction, by estimation, is 50 to 55%. The left ventricle has low normal function. The left ventricle demonstrates regional wall motion abnormalities (see scoring diagram/findings for description). Left ventricular diastolic  parameters are indeterminate. The average left ventricular global longitudinal strain is -7.1 %. The global longitudinal strain is abnormal.  2. Right ventricular systolic function is normal. The right ventricular size is normal. There is mildly elevated pulmonary artery systolic pressure. The estimated right ventricular systolic pressure is 44.2 mmHg.  3. The mitral valve is normal in structure. Mild mitral valve regurgitation. No evidence of mitral stenosis.  4. The aortic valve is normal in structure. Aortic valve regurgitation is not visualized. No aortic stenosis is present.  5. The inferior vena cava is normal in size with greater than 50% respiratory variability, suggesting right atrial pressure of 3 mmHg. Comparison(s): Prior images reviewed side by side. The left ventricular function is worsened. FINDINGS  Left Ventricle: Left ventricular ejection fraction, by estimation, is 50 to 55%. The left ventricle has low normal function. The left ventricle demonstrates  regional wall motion abnormalities. The average left ventricular global longitudinal strain is -7.1 %. The global longitudinal strain is abnormal. The left ventricular internal cavity size was normal in size. There is no left ventricular hypertrophy. Left ventricular diastolic parameters are indeterminate.  LV Wall Scoring: The apical septal segment is akinetic. Right Ventricle: The right ventricular size is normal. No increase in right ventricular wall thickness. Right ventricular systolic function is normal. There is mildly elevated pulmonary artery systolic pressure. The tricuspid regurgitant velocity is 3.21  m/s, and with an assumed right atrial pressure of 3 mmHg, the estimated right ventricular systolic pressure is 44.2 mmHg. Left Atrium: Left atrial size was normal in size. Right Atrium: Right atrial size was normal in size. Pericardium: There is no evidence of pericardial effusion. Mitral Valve: The mitral valve is normal in structure. Mild mitral valve regurgitation. No evidence of mitral valve stenosis. Tricuspid Valve: The tricuspid valve is normal in structure. Tricuspid valve regurgitation is mild . No evidence of tricuspid stenosis. Aortic Valve: The aortic valve is normal in structure. Aortic valve regurgitation is not visualized. No aortic stenosis is present. Pulmonic Valve: The pulmonic valve was normal in structure. Pulmonic valve regurgitation is not visualized. No evidence of pulmonic stenosis. Aorta: The aortic root is normal in size and structure. Venous: The inferior vena cava is normal in size with greater than 50% respiratory variability, suggesting right atrial pressure of 3 mmHg. IAS/Shunts: No atrial level shunt detected by color flow Doppler.  LEFT VENTRICLE PLAX 2D LVIDd:         4.30 cm   Diastology LVIDs:         2.80 cm   LV e' medial:    3.70 cm/s LV PW:         0.80 cm   LV E/e' medial:  34.3 LV IVS:        0.90 cm   LV e' lateral:   9.90 cm/s LVOT diam:     1.60 cm   LV E/e'  lateral: 12.8 LV SV:         40 LV SV Index:   23        2D Longitudinal Strain LVOT Area:     2.01 cm  2D Strain GLS Avg:     -7.1 %  RIGHT VENTRICLE             IVC RV Basal diam:  2.30 cm     IVC diam: 2.00 cm RV S prime:     11.40 cm/s TAPSE (M-mode): 1.8 cm LEFT ATRIUM             Index        RIGHT ATRIUM          Index LA diam:        3.20 cm 1.87 cm/m   RA Area:     9.39 cm LA Vol (A2C):   45.8 ml 26.82 ml/m  RA Volume:   20.90 ml 12.24 ml/m LA Vol (A4C):   25.5 ml 14.93 ml/m LA Biplane Vol: 34.1 ml 19.97 ml/m  AORTIC VALVE LVOT Vmax:   105.00 cm/s LVOT Vmean:  72.800 cm/s LVOT VTI:    0.199 m  AORTA Ao Root diam: 2.40 cm Ao Asc diam:  2.50 cm MITRAL VALVE                TRICUSPID VALVE MV Area (PHT): 5.02 cm     TR Peak grad:   41.2 mmHg MV Decel Time: 151 msec     TR Vmax:        321.00 cm/s MR Peak grad: 98.8 mmHg MR Mean grad: 65.0 mmHg     SHUNTS MR Vmax:      497.00 cm/s   Systemic VTI:  0.20 m MR Vmean:     385.0 cm/s    Systemic Diam: 1.60 cm MV E velocity: 127.00 cm/s MV A velocity: 103.00 cm/s MV E/A ratio:  1.23 Donato Schultz MD Electronically signed by Donato Schultz MD Signature Date/Time: 07/12/2022/3:50:42 PM    Final         Scheduled Meds:  insulin aspart  0-15 Units Subcutaneous TID WC   insulin aspart  0-5 Units Subcutaneous QHS   insulin aspart  10 Units Subcutaneous TID WC   insulin glargine-yfgn  52 Units Subcutaneous QHS   levothyroxine  88 mcg Oral QAC breakfast   loratadine  10 mg Oral Daily   pantoprazole (PROTONIX) IV  40 mg Intravenous Q12H     LOS: 2 days    Time spent: 35 minutes    Cheralyn Oliver Hoover Brunette, DO [facility]  If 7PM-7AM, please contact night-coverage [email or web address] 07/13/2022, 10:45 AM

## 2022-07-13 NOTE — Progress Notes (Signed)
Jessica Torres, M.D. Gastroenterology & Hepatology   Interval History:  Patient reports feeling well today and denies any complaints.  States that her chest and abdominal pain have resolved.  She reported she has not had a bowel movement yet. Hemoglobin has remained stable at 8.7.  Notably, troponins down trended but have remained in the 600 range.  Inpatient Medications:  Current Facility-Administered Medications:    acetaminophen (TYLENOL) tablet 650 mg, 650 mg, Oral, Q6H PRN, Adefeso, Oladapo, DO, 650 mg at 07/12/22 2033   insulin aspart (novoLOG) injection 0-15 Units, 0-15 Units, Subcutaneous, TID WC, Levie Heritage, DO, 2 Units at 07/13/22 0935   insulin aspart (novoLOG) injection 0-5 Units, 0-5 Units, Subcutaneous, QHS, Stinson, Jacob J, DO   insulin aspart (novoLOG) injection 10 Units, 10 Units, Subcutaneous, TID WC, Levie Heritage, DO, 10 Units at 07/13/22 0934   insulin glargine-yfgn (SEMGLEE) injection 52 Units, 52 Units, Subcutaneous, QHS, Levie Heritage, DO, 52 Units at 07/12/22 2152   levothyroxine (SYNTHROID) tablet 88 mcg, 88 mcg, Oral, QAC breakfast, Levie Heritage, DO, 88 mcg at 07/13/22 0558   loratadine (CLARITIN) tablet 10 mg, 10 mg, Oral, Daily, Sherryll Burger, Pratik D, DO, 10 mg at 07/13/22 0932   ondansetron (ZOFRAN) tablet 4 mg, 4 mg, Oral, Q6H PRN **OR** ondansetron (ZOFRAN) injection 4 mg, 4 mg, Intravenous, Q6H PRN, Stinson, Jacob J, DO   pantoprazole (PROTONIX) injection 40 mg, 40 mg, Intravenous, Q12H, Stinson, Jacob J, DO, 40 mg at 07/13/22 0932   phenol (CHLORASEPTIC) mouth spray 1 spray, 1 spray, Mouth/Throat, PRN, Sherryll Burger, Pratik D, DO   I/O    Intake/Output Summary (Last 24 hours) at 07/13/2022 1013 Last data filed at 07/13/2022 0500 Gross per 24 hour  Intake 600 ml  Output 900 ml  Net -300 ml     Physical Exam: Temp:  [98.1 F (36.7 C)-101.2 F (38.4 C)] 98.3 F (36.8 C) (05/05 0433) Pulse Rate:  [93-96] 96 (05/05 0742) Resp:  [16-18] 18 (05/05  0433) BP: (104-121)/(40-58) 116/54 (05/05 0742) SpO2:  [92 %-100 %] 95 % (05/05 0433)  Temp (24hrs), Avg:99.1 F (37.3 C), Min:98.1 F (36.7 C), Max:101.2 F (38.4 C) GENERAL: The patient is AO x3, in no acute distress. HEENT: Head is normocephalic and atraumatic. EOMI are intact. Mouth is well hydrated and without lesions. NECK: Supple. No masses LUNGS: Clear to auscultation. No presence of rhonchi/wheezing/rales. Adequate chest expansion HEART: RRR, normal s1 and s2. ABDOMEN: Soft, nontender, no guarding, no peritoneal signs, and nondistended. BS +. No masses. EXTREMITIES: Without any cyanosis, clubbing, rash, lesions or edema. NEUROLOGIC: AOx3, no focal motor deficit. SKIN: no jaundice, no rashes  Laboratory Data: CBC:     Component Value Date/Time   WBC 8.0 07/13/2022 0511   RBC 2.97 (L) 07/13/2022 0511   HGB 8.7 (L) 07/13/2022 0511   HGB 11.9 08/22/2021 1131   HCT 26.4 (L) 07/13/2022 0511   HCT 37.5 08/22/2021 1131   PLT 232 07/13/2022 0511   PLT 255 08/22/2021 1131   MCV 88.9 07/13/2022 0511   MCV 96 08/22/2021 1131   MCH 29.3 07/13/2022 0511   MCHC 33.0 07/13/2022 0511   RDW 15.7 (H) 07/13/2022 0511   RDW 12.6 08/22/2021 1131   LYMPHSABS 1.6 07/11/2022 1521   LYMPHSABS 1.5 08/22/2021 1131   MONOABS 1.0 07/11/2022 1521   EOSABS 0.1 07/11/2022 1521   EOSABS 0.3 08/22/2021 1131   BASOSABS 0.1 07/11/2022 1521   BASOSABS 0.1 08/22/2021 1131   COAG:  Lab Results  Component Value Date   INR 1.4 (H) 07/12/2022   INR 1.4 (H) 07/11/2022   INR 1.3 (H) 04/27/2022    BMP:     Latest Ref Rng & Units 07/13/2022    5:11 AM 07/12/2022    2:45 AM 07/11/2022    3:21 PM  BMP  Glucose 70 - 99 mg/dL 474  259  563   BUN 8 - 23 mg/dL 20  21  22    Creatinine 0.44 - 1.00 mg/dL 8.75  6.43  3.29   Sodium 135 - 145 mmol/L 131  132  132   Potassium 3.5 - 5.1 mmol/L 3.6  3.9  4.1   Chloride 98 - 111 mmol/L 104  106  104   CO2 22 - 32 mmol/L 21  19  19    Calcium 8.9 - 10.3 mg/dL 8.7   8.6  9.3     HEPATIC:     Latest Ref Rng & Units 07/11/2022    3:21 PM 02/21/2022   11:11 AM 11/28/2021   10:40 AM  Hepatic Function  Total Protein 6.5 - 8.1 g/dL 6.7  6.7  6.6   Albumin 3.5 - 5.0 g/dL 2.9  3.4  3.6   AST 15 - 41 U/L 34  45  53   ALT 0 - 44 U/L 13  12  18    Alk Phosphatase 38 - 126 U/L 56  89  83   Total Bilirubin 0.3 - 1.2 mg/dL 2.7  0.9  0.6     CARDIAC:  Lab Results  Component Value Date   CKTOTAL 81 01/19/2015      Imaging: I personally reviewed and interpreted the available labs, imaging and endoscopic files.   Assessment/Plan: Jessica Torres is a 82 y.o. female with history of hyperlipidemia, hypertension, hypothyroidism, GERD, arthritis, diabetes, stroke, peripheral arterial disease on Plavix, uterine cancer and history of GAVE, who came to the hospital after presenting melena, worsening anemia and fatigue.  Patient has presented intermittent episodes of melena for multiple months.  Had relatively recent endoscopic investigations that showed abnormalities concerning for GAVE which was successfully ablated x 1.  However, her symptoms persisted and she had recurrence of severe iron deficiency anemia.  She has responded to blood transfusion adequately.  We discussed in detail that we will need to investigate her symptoms further with an EGD but we will also need for Plavix washout.  Potentially, we will proceed with EGD on 07/15/2022.  However, I have requested the hospitalist to obtain cardiology clearance given her elevated troponins and recent history of NSTEMI.  For now, we will continue with PPI twice daily   Notably, upon review of  the reports from previous CT of the abdomen pelvis with and without IV contrast from 05/25/2022, she had  nodular hepatic contour and presence of ascites.  She also has inversion of her AST to ALT ratio and elevation of her INR to 1.4.  This is concerning for  cirrhosis.  MELD 3.0 was 21. She does not have history of alcohol intake.  Serologies to evaluate for other etiologies of liver cirrhosis are pending.  She has not presented any decompensating events,  but GAVE could be associated to her liver disease.   - Repeat CBC qday, transfuse if Hb <8 - Pantoprazole 40 mg q12h IVP - 2 large bore IV lines - Active T/S - GI soft diet - Continue holding Plavix - Avoid NSAIDs - Will proceed with EGD on 07/15/2022 if cleared by cardiology -  Follow up hepatitis A/B/C serologies, ANA, AMA, ASMA, IgG, A1AT -Start MiraLAX daily for constipation   Jessica Blazing, MD Gastroenterology and Hepatology [facility] Gastroenterology

## 2022-07-13 NOTE — Progress Notes (Signed)
CRITICAL VALUE STICKER  CRITICAL VALUE:  RECEIVER (on-site recipient of call):Drue Dun, lpn  DATE & TIME NOTIFIED: 5/5 0708  MESSENGER (representative from lab):  MD NOTIFIED: Sherryll Burger  TIME OF NOTIFICATION: 0720

## 2022-07-14 ENCOUNTER — Inpatient Hospital Stay (HOSPITAL_COMMUNITY): Payer: Medicare HMO

## 2022-07-14 ENCOUNTER — Encounter (HOSPITAL_COMMUNITY): Payer: Self-pay | Admitting: Family Medicine

## 2022-07-14 DIAGNOSIS — R7989 Other specified abnormal findings of blood chemistry: Secondary | ICD-10-CM | POA: Diagnosis not present

## 2022-07-14 DIAGNOSIS — K7689 Other specified diseases of liver: Secondary | ICD-10-CM

## 2022-07-14 DIAGNOSIS — R079 Chest pain, unspecified: Secondary | ICD-10-CM

## 2022-07-14 DIAGNOSIS — D62 Acute posthemorrhagic anemia: Secondary | ICD-10-CM | POA: Diagnosis not present

## 2022-07-14 DIAGNOSIS — R9431 Abnormal electrocardiogram [ECG] [EKG]: Secondary | ICD-10-CM

## 2022-07-14 LAB — HEPATIC FUNCTION PANEL
ALT: 12 U/L (ref 0–44)
AST: 40 U/L (ref 15–41)
Albumin: 2.2 g/dL — ABNORMAL LOW (ref 3.5–5.0)
Alkaline Phosphatase: 50 U/L (ref 38–126)
Bilirubin, Direct: 1.3 mg/dL — ABNORMAL HIGH (ref 0.0–0.2)
Indirect Bilirubin: 1.4 mg/dL — ABNORMAL HIGH (ref 0.3–0.9)
Total Bilirubin: 2.7 mg/dL — ABNORMAL HIGH (ref 0.3–1.2)
Total Protein: 5.7 g/dL — ABNORMAL LOW (ref 6.5–8.1)

## 2022-07-14 LAB — BASIC METABOLIC PANEL
Anion gap: 7 (ref 5–15)
BUN: 16 mg/dL (ref 8–23)
CO2: 20 mmol/L — ABNORMAL LOW (ref 22–32)
Calcium: 8.5 mg/dL — ABNORMAL LOW (ref 8.9–10.3)
Chloride: 104 mmol/L (ref 98–111)
Creatinine, Ser: 1.27 mg/dL — ABNORMAL HIGH (ref 0.44–1.00)
GFR, Estimated: 43 mL/min — ABNORMAL LOW (ref >60)
Glucose, Bld: 91 mg/dL (ref 70–99)
Potassium: 3.8 mmol/L (ref 3.5–5.1)
Sodium: 131 mmol/L — ABNORMAL LOW (ref 135–145)

## 2022-07-14 LAB — CBC
HCT: 27.1 % — ABNORMAL LOW (ref 36.0–46.0)
Hemoglobin: 9 g/dL — ABNORMAL LOW (ref 12.0–15.0)
MCH: 30 pg (ref 26.0–34.0)
MCHC: 33.2 g/dL (ref 30.0–36.0)
MCV: 90.3 fL (ref 80.0–100.0)
Platelets: 246 10*3/uL (ref 150–400)
RBC: 3 MIL/uL — ABNORMAL LOW (ref 3.87–5.11)
RDW: 15.4 % (ref 11.5–15.5)
WBC: 7.8 10*3/uL (ref 4.0–10.5)
nRBC: 0 % (ref 0.0–0.2)

## 2022-07-14 LAB — MAGNESIUM: Magnesium: 1.8 mg/dL (ref 1.7–2.4)

## 2022-07-14 LAB — PROTIME-INR
INR: 1.5 — ABNORMAL HIGH (ref 0.8–1.2)
Prothrombin Time: 17.5 seconds — ABNORMAL HIGH (ref 11.4–15.2)

## 2022-07-14 LAB — GLUCOSE, CAPILLARY
Glucose-Capillary: 102 mg/dL — ABNORMAL HIGH (ref 70–99)
Glucose-Capillary: 159 mg/dL — ABNORMAL HIGH (ref 70–99)
Glucose-Capillary: 219 mg/dL — ABNORMAL HIGH (ref 70–99)
Glucose-Capillary: 258 mg/dL — ABNORMAL HIGH (ref 70–99)

## 2022-07-14 LAB — ANA W/REFLEX IF POSITIVE: Anti Nuclear Antibody (ANA): NEGATIVE

## 2022-07-14 LAB — ECHOCARDIOGRAM LIMITED

## 2022-07-14 LAB — MITOCHONDRIAL ANTIBODIES: Mitochondrial M2 Ab, IgG: <20 Units (ref 0.0–20.0)

## 2022-07-14 LAB — ANTI-SMOOTH MUSCLE ANTIBODY, IGG: F-Actin IgG: 4 Units (ref 0–19)

## 2022-07-14 MED ORDER — PERFLUTREN LIPID MICROSPHERE
1.0000 mL | INTRAVENOUS | Status: AC | PRN
  Administered 2022-07-14: 3 mL via INTRAVENOUS

## 2022-07-14 NOTE — Progress Notes (Signed)
*  PRELIMINARY RESULTS* Echocardiogram Limited 2D Echocardiogram has been performed with Definity.  [name] 07/14/2022, 4:18 PM

## 2022-07-14 NOTE — Consult Note (Addendum)
Cardiology Consultation   Patient ID: Jessica Torres MRN: [id number]; DOB: 05-Feb-1941  Admit date: 07/11/2022 Date of Consult: 07/14/2022  PCP:  Rebekah Chesterfield, NP   [facility] Providers Cardiologist:  Nanetta Batty, MD        Patient Profile:   Jessica Torres is a 82 y.o. 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 Sherryll Burger.  History of Present Illness:   Jessica Torres remotely followed with Dr. Allyson Sabal for h/o PAD with prior bilateral LE interventions with procedures back in 2015, 2016, and 2018. She was previously referred to vascular surgery for consideration of left fem-pop but felt to be poor candidate due to lack of venous conduit. Last angiography was in 2018 with angioplasty of the right SFA CTO. She had been maintained on Plavix, also placed on Pletal in 2022. She was last advised to f/u 3 months but 07/2020 was last OV. She does report she was sen by a different doctor, unsure where, who asked her if she'd ever been told she had a heart murmur. She denies ever having a formal cardiac diagnosis.  She was recently admitted 04/2022 for ABL anemia requiring 3 U PRBCs, with gastritis/GAVE and APC treatment. Per notes, "Bleeding can reasonably be expected to recur with reinitiation of antiplatelet agents." Plavix resumed at discharge and she was discharged on PPI. She was found to have elevated troponin of 457 with stable echo, recommended for OP f/u. She presented to the ER this admission with abnormal labwork after PCP found Hgb to be again severely low, checked due to weakness, fatigue, chest pain, and upper abdominal pain. She'd also had melena. Initial Hgb was 6.1 here. She reports for the  last few weeks she'd felt progressively weaker and ice cold. She also noticed intermittent chest pain down her arms worse with walking, improved with sitting on the side of the bed taking deep breaths or using a heating pad. No syncope or palpitations. Labs also have shown mild hyponatremia, INR 1.4. 13 troponins were trended at [phone]. Plavix has been held. She received additional units of blood with plan for EGD 07/15/22 if cleared by our team. Echo 07/12/22 EF 50-55% with apical septal akinesis (EF down from 60-65% in 04/2022), normal RV, mild MR. Her chest pain resolved with transfusion.   Past Medical History:  Diagnosis Date   Arthritis    "fingers probably" (10/30/2016)   Bulging lumbar disc    Chronic lower back pain    Difficulty urinating    GERD (gastroesophageal reflux disease)    Heart murmur    hx   Hyperlipidemia    statin intolerant   Hypertension    Hypothyroidism    PAD (peripheral artery disease) (HCC)    Sleep apnea    "dx'd years ago; had severe problems; all of the sudden it just went away" (07/06/2014) *still gone" (10/30/2016)   Stroke [facility]) ~ 2010   denies residual on 10/30/2016   Type 2 diabetes mellitus (HCC)    Urgency of urination    Uterine cancer [facility])     Past Surgical History:  Procedure Laterality Date   ANGIOPLASTY Right 01/22/2015   w/ atherectomy R sfa   BALLOON ANGIOPLASTY, ARTERY Right  SFA/notes 07/06/2014   BIOPSY  04/28/2022   Procedure: BIOPSY;  Surgeon: Tressia Danas, MD;  Location: [facility] ENDOSCOPY;  Service: Gastroenterology;;   BREAST EXCISIONAL BIOPSY Left    BREAST SURGERY     CARPAL TUNNEL RELEASE Right 1980's   COLONOSCOPY WITH PROPOFOL N/A 11/26/2020   Procedure: COLONOSCOPY WITH PROPOFOL;  Surgeon: Lanelle Bal, DO;  Location: [facility] ENDO SUITE;  Service: Endoscopy;  Laterality: N/A;  9:00am, pt cannot come in earlier   DILATION AND CURETTAGE OF UTERUS     ESOPHAGOGASTRODUODENOSCOPY N/A  04/28/2022   Procedure: ESOPHAGOGASTRODUODENOSCOPY (EGD);  Surgeon: Tressia Danas, MD;  Location: [facility] ENDOSCOPY;  Service: Gastroenterology;  Laterality: N/A;   ESOPHAGOGASTRODUODENOSCOPY (EGD) WITH PROPOFOL N/A 04/30/2022   Procedure: ESOPHAGOGASTRODUODENOSCOPY (EGD) WITH PROPOFOL;  Surgeon: Tressia Danas, MD;  Location: [facility] ENDOSCOPY;  Service: Gastroenterology;  Laterality: N/A;  With APC   HEMORRHOID SURGERY     "dr lanced it; in dr's office"   HOT HEMOSTASIS N/A 04/30/2022   Procedure: HOT HEMOSTASIS (ARGON PLASMA COAGULATION/BICAP);  Surgeon: Tressia Danas, MD;  Location: [facility] ENDOSCOPY;  Service: Gastroenterology;  Laterality: N/A;   INCISION AND DRAINAGE BREAST ABSCESS Left    LOWER EXTREMITY ANGIOGRAM N/A 07/06/2014   Procedure: LOWER EXTREMITY ANGIOGRAM & R-SFA atherectomy and balloon angioplasty;  Surgeon: Runell Gess, MD;  Location: [facility] CATH LAB;  Service: Cardiovascular;  Laterality: R   LOWER EXTREMITY ANGIOGRAM  10/30/2016   LOWER EXTREMITY ANGIOGRAPHY N/A 10/30/2016   Procedure: Lower Extremity Angiography;  Surgeon: Runell Gess, MD;  Location: [facility] INVASIVE CV LAB;  Service: Cardiovascular;  Laterality: N/A;   PERIPHERAL VASCULAR CATHETERIZATION N/A 08/17/2014   Procedure: Lower Extremity Angiography;  Surgeon: Runell Gess, MD;  Location: [facility] INVASIVE CV LAB;  Service: Cardiovascular;  Laterality: N/A;   PERIPHERAL VASCULAR CATHETERIZATION Left 08/17/2014   Procedure: Peripheral Vascular Atherectomy and Viabahn stent L-SFA;  Surgeon: Runell Gess, MD; Laterality: Left; L-SFA   PERIPHERAL VASCULAR CATHETERIZATION N/A 01/11/2015   Procedure: Lower Extremity Angiography;  Surgeon: Runell Gess, MD;  Location: [facility] INVASIVE CV LAB;  Service: Cardiovascular;  Laterality: N/A;   PERIPHERAL VASCULAR CATHETERIZATION Right 01/22/2015   Procedure: Peripheral Vascular Atherectomy;  Surgeon: Runell Gess, MD;  Location: [facility] INVASIVE CV LAB;  Service: Cardiovascular;   Laterality: Right;   POLYPECTOMY  11/26/2020   Procedure: POLYPECTOMY;  Surgeon: Lanelle Bal, DO;  Location: [facility] ENDO SUITE;  Service: Endoscopy;;   TUBAL LIGATION     VAGINAL HYSTERECTOMY       Home Medications:  Prior to Admission medications   Medication Sig Start Date End Date Taking? Authorizing Provider  clopidogrel (PLAVIX) 75 MG tablet TAKE 1 TABLET DAILY 04/02/22  Yes Hawks, Christy A, FNP  ezetimibe (ZETIA) 10 MG tablet Take 1 tablet (10 mg total) by mouth daily. 08/22/21  Yes Hawks, Neysa Bonito A, FNP  fenofibrate 160 MG tablet TAKE 1 TABLET DAILY 04/02/22  Yes Hawks, Christy A, FNP  insulin glargine, 2 Unit Dial, (TOUJEO MAX SOLOSTAR) 300 UNIT/ML Solostar Pen INJECT 52 UNITS UNDER THE SKIN DAILY Patient taking differently: Inject 26 Units into the skin at bedtime. 03/18/22  Yes Hawks, Christy A, FNP  insulin lispro (HUMALOG) 100 UNIT/ML KwikPen Inject 10-20 Units into the skin 3 (three) times daily. Patient taking differently: Inject 20-22 Units into the skin at bedtime. 08/22/21  Yes Hawks, Christy A, FNP  levothyroxine (SYNTHROID) 88 MCG tablet TAKE 1 TABLET DAILY BEFORE BREAKFAST Patient taking differently: Take 88 mcg by mouth daily  before breakfast. 04/24/22  Yes Hawks, Christy A, FNP  ondansetron (ZOFRAN) 4 MG tablet Take 4 mg by mouth every 8 (eight) hours as needed for vomiting or nausea. For 7 days   Yes [provider]  pantoprazole (PROTONIX) 40 MG tablet Take 1 tablet (40 mg total) by mouth 2 (two) times daily. Follow up with gastroenterology for additional recommendations 04/30/22 07/12/22 Yes Zigmund Daniel., MD  Accu-Chek Softclix Lancets lancets Test BS TID Dx E11.9 12/02/21   Jannifer Rodney A, FNP  Alcohol Swabs (B-D SINGLE USE SWABS REGULAR) PADS Test BS 4 times daily Dx E11.9 04/02/21   Junie Spencer, FNP  BD PEN NEEDLE NANO 2ND GEN 32G X 4 MM MISC Use twice daily with insulin Dx E11.9 11/25/21   Junie Spencer, FNP  ferrous sulfate 325 (65 FE) MG EC  tablet Take 1 tablet (325 mg total) by mouth every Monday, Wednesday, and Friday. Patient not taking: Reported on 07/12/2022 04/30/22 06/25/22  Zigmund Daniel., MD  glucose blood (FREESTYLE LITE) test strip Test BS TID Dx E11.9 11/25/21   Junie Spencer, FNP  Semaglutide,0.25 or 0.5MG /DOS, 2 MG/3ML SOPN Inject 0.25 mg into the skin once a week. Patient not taking: Reported on 04/27/2022 03/14/22   Junie Spencer, FNP  metFORMIN (GLUCOPHAGE) 1000 MG tablet Take 1 tablet (1,000 mg total) by mouth 2 (two) times daily with a meal. Patient taking differently: Take 1,000 mg by mouth 2 (two) times daily with a meal. Only taking once a day 04/06/19 07/13/19  Remus Loffler, PA-C    Inpatient Medications: Scheduled Meds:  insulin aspart  0-15 Units Subcutaneous TID WC   insulin aspart  0-5 Units Subcutaneous QHS   insulin aspart  10 Units Subcutaneous TID WC   insulin glargine-yfgn  52 Units Subcutaneous QHS   levothyroxine  88 mcg Oral QAC breakfast   loratadine  10 mg Oral Daily   pantoprazole (PROTONIX) IV  40 mg Intravenous Q12H   polyethylene glycol  17 g Oral Daily   Continuous Infusions:  PRN Meds: acetaminophen, ondansetron **OR** ondansetron (ZOFRAN) IV, phenol  Allergies:    Allergies  Allergen Reactions   Morphine And Related Swelling and Other (See Comments)    Facial swelling   Latex Rash   Statins Other (See Comments)    Myalgias     Social History:   Social History   Socioeconomic History   Marital status: Divorced    Spouse name: Not on file   Number of children: 4   Years of education: Not on file   Highest education level: Not on file  Occupational History   Occupation: retired  Tobacco Use   Smoking status: Never   Smokeless tobacco: Never  Vaping Use   Vaping Use: Never used  Substance and Sexual Activity   Alcohol use: Yes    Alcohol/week: 0.0 standard drinks of alcohol    Comment: 10/30/2016  "margarita q  2 wks or so"   Drug use: No   Sexual  activity: Never  Other Topics Concern   Not on file  Social History Narrative   2 sons and their sons are living with her right now   Another son lives next door and daughter lives in [place]   Social Determinants of Health   Financial Resource Strain: Low Risk  (09/25/2020)   Overall Financial Resource Strain (CARDIA)    Difficulty of Paying Living Expenses: Not hard at all  Food Insecurity:  No Food Insecurity (04/27/2022)   Hunger Vital Sign    Worried About Running Out of Food in the Last Year: Never true    Ran Out of Food in the Last Year: Never true  Transportation Needs: No Transportation Needs (04/27/2022)   PRAPARE - Administrator, Civil Service (Medical): No    Lack of Transportation (Non-Medical): No  Physical Activity: Inactive (09/25/2020)   Exercise Vital Sign    Days of Exercise per Week: 0 days    Minutes of Exercise per Session: 0 min  Stress: Stress Concern Present (09/25/2020)   [employer] of Occupational Health - Occupational Stress Questionnaire    Feeling of Stress : To some extent  Social Connections: Moderately Integrated (09/25/2020)   Social Connection and Isolation Panel [NHANES]    Frequency of Communication with Friends and Family: More than three times a week    Frequency of Social Gatherings with Friends and Family: More than three times a week    Attends Religious Services: More than 4 times per year    Active Member of [employer] or Organizations: Yes    Attends Engineer, structural: More than 4 times per year    Marital Status: Divorced  Intimate Partner Violence: Not At Risk (04/27/2022)   Humiliation, Afraid, Rape, and Kick questionnaire    Fear of Current or Ex-Partner: No    Emotionally Abused: No    Physically Abused: No    Sexually Abused: No    Family History:   Family History  Problem Relation Age of Onset   Heart attack Mother    Heart disease Mother    Arrhythmia Mother        has PPM   Heart disease  Father    Diabetes Sister    Breast cancer Neg Hx      ROS:  Please see the history of present illness.  All other ROS reviewed and negative.     Physical Exam/Data:   Vitals:   07/13/22 1208 07/13/22 2100 07/14/22 0438 07/14/22 0631  BP: (!) 99/44 (!) 104/51 (!) 119/39 (!) 118/52  Pulse: 96 86 95   Resp:  18 18 18   Temp: 98.7 F (37.1 C) 98.9 F (37.2 C) 98.3 F (36.8 C)   TempSrc: Oral Oral Oral   SpO2: 97% 96% 93%     Intake/Output Summary (Last 24 hours) at 07/14/2022 0914 Last data filed at 07/14/2022 0437 Gross per 24 hour  Intake 460 ml  Output 600 ml  Net -140 ml      04/30/2022    4:00 AM 04/29/2022   11:53 PM 04/29/2022    3:48 AM  Last 3 Weights  Weight (lbs) 141 lb 8.6 oz 141 lb 8.6 oz 140 lb 14 oz  Weight (kg) 64.2 kg 64.2 kg 63.9 kg     There is no height or weight on file to calculate BMI.  General: Well developed, well nourished, in no acute distress. Head: Normocephalic, atraumatic, sclera non-icteric, no xanthomas, nares are without discharge. Neck: Negative for carotid bruits. JVP not elevated. Lungs: Clear bilaterally to auscultation without wheezes, rales, or rhonchi. Breathing is unlabored. Heart: RRR S1 S2 soft high pitched SEM at RUSB without rubs or gallops.  Abdomen: Soft, non-tender, non-distended with normoactive bowel sounds. No rebound/guarding. Extremities: No clubbing or cyanosis. No edema. Distal pedal pulses are 2+ and equal bilaterally. Neuro: Alert and oriented X 3. Moves all extremities spontaneously. Psych:  Responds to questions appropriately with a normal  affect.   EKG:  The EKG was personally reviewed and demonstrates:  NSR 98bpm, first degree AVB, ST depression noted inferiorly and V3-V6. Similar to 04/2022 when similarly anemic, These ST changes are new from 07/2020. Telemetry:  Telemetry was personally reviewed and demonstrates:  NSR  Relevant CV Studies: 2D echo 07/12/22   1. Left ventricular ejection fraction, by estimation,  is 50 to 55%. The  left ventricle has low normal function. The left ventricle demonstrates  regional wall motion abnormalities (see scoring diagram/findings for  description). Left ventricular diastolic   parameters are indeterminate. The average left ventricular global  longitudinal strain is -7.1 %. The global longitudinal strain is abnormal.   2. Right ventricular systolic function is normal. The right ventricular  size is normal. There is mildly elevated pulmonary artery systolic  pressure. The estimated right ventricular systolic pressure is 44.2 mmHg.   3. The mitral valve is normal in structure. Mild mitral valve  regurgitation. No evidence of mitral stenosis.   4. The aortic valve is normal in structure. Aortic valve regurgitation is  not visualized. No aortic stenosis is present.   5. The inferior vena cava is normal in size with greater than 50%  respiratory variability, suggesting right atrial pressure of 3 mmHg.   Comparison(s): Prior images reviewed side by side. The left ventricular  function is worsened.   Laboratory Data:  High Sensitivity Troponin:   Recent Labs  Lab 07/12/22 2245 07/13/22 0511 07/13/22 1103 07/13/22 1700 07/13/22 2256  TROPONINIHS 647* 677* 554* 615* 619*     Chemistry Recent Labs  Lab 07/12/22 0245 07/13/22 0511 07/14/22 0527  NA 132* 131* 131*  K 3.9 3.6 3.8  CL 106 104 104  CO2 19* 21* 20*  GLUCOSE 175* 161* 91  BUN 21 20 16   CREATININE 1.35* 1.30* 1.27*  CALCIUM 8.6* 8.7* 8.5*  MG  --  1.8 1.8  GFRNONAA 40* 42* 43*  ANIONGAP 7 6 7     Recent Labs  Lab 07/11/22 1521  PROT 6.7  ALBUMIN 2.9*  AST 34  ALT 13  ALKPHOS 56  BILITOT 2.7*   Lipids No results for input(s): "CHOL", "TRIG", "HDL", "LABVLDL", "LDLCALC", "CHOLHDL" in the last 168 hours.  Hematology Recent Labs  Lab 07/12/22 0245 07/12/22 0931 07/13/22 0511 07/14/22 0527  WBC 9.5  --  8.0 7.8  RBC 2.93*  --  2.97* 3.00*  HGB 8.7* 8.5* 8.7* 9.0*  HCT 25.8*  25.6* 26.4* 27.1*  MCV 88.1  --  88.9 90.3  MCH 29.7  --  29.3 30.0  MCHC 33.7  --  33.0 33.2  RDW 15.5  --  15.7* 15.4  PLT 234  --  232 246   Thyroid No results for input(s): "TSH", "FREET4" in the last 168 hours.  BNPNo results for input(s): "BNP", "PROBNP" in the last 168 hours.  DDimer No results for input(s): "DDIMER" in the last 168 hours.   Radiology/Studies:  ECHOCARDIOGRAM COMPLETE  Result Date: 07/12/2022    ECHOCARDIOGRAM REPORT   Patient Name:   ANTONITA MCKENDRY Date of Exam: 07/12/2022 Medical Rec #:  [id number]      Height:       65.0 in Accession #:    [id number]     Weight:       141.5 lb Date of Birth:  Apr 13, 1940      BSA:          1.708 m Patient Age:    29 years  BP:           104/49 mmHg Patient Gender: F              HR:           97 bpm. [facility] Procedure: 2D Echo, Color Doppler and Cardiac Doppler Indications:    elevated troponin  History:        Patient has prior history of Echocardiogram examinations, most                 recent 04/28/2022. CAD, PAD and chronic kidney disease,                 Signs/Symptoms:Murmur; Risk Factors:Hypertension, Dyslipidemia,                 Diabetes and Sleep Apnea.  Sonographer:    Delcie Roch RDCS Referring Phys: [id number] PRATIK D [facility] IMPRESSIONS  1. Left ventricular ejection fraction, by estimation, is 50 to 55%. The left ventricle has low normal function. The left ventricle demonstrates regional wall motion abnormalities (see scoring diagram/findings for description). Left ventricular diastolic  parameters are indeterminate. The average left ventricular global longitudinal strain is -7.1 %. The global longitudinal strain is abnormal.  2. Right ventricular systolic function is normal. The right ventricular size is normal. There is mildly elevated pulmonary artery systolic pressure. The estimated right ventricular systolic pressure is 44.2 mmHg.  3. The mitral valve is normal in structure. Mild mitral valve  regurgitation. No evidence of mitral stenosis.  4. The aortic valve is normal in structure. Aortic valve regurgitation is not visualized. No aortic stenosis is present.  5. The inferior vena cava is normal in size with greater than 50% respiratory variability, suggesting right atrial pressure of 3 mmHg. Comparison(s): Prior images reviewed side by side. The left ventricular function is worsened. FINDINGS  Left Ventricle: Left ventricular ejection fraction, by estimation, is 50 to 55%. The left ventricle has low normal function. The left ventricle demonstrates regional wall motion abnormalities. The average left ventricular global longitudinal strain is -7.1 %. The global longitudinal strain is abnormal. The left ventricular internal cavity size was normal in size. There is no left ventricular hypertrophy. Left ventricular diastolic parameters are indeterminate.  LV Wall Scoring: The apical septal segment is akinetic. Right Ventricle: The right ventricular size is normal. No increase in right ventricular wall thickness. Right ventricular systolic function is normal. There is mildly elevated pulmonary artery systolic pressure. The tricuspid regurgitant velocity is 3.21  m/s, and with an assumed right atrial pressure of 3 mmHg, the estimated right ventricular systolic pressure is 44.2 mmHg. Left Atrium: Left atrial size was normal in size. Right Atrium: Right atrial size was normal in size. Pericardium: There is no evidence of pericardial effusion. Mitral Valve: The mitral valve is normal in structure. Mild mitral valve regurgitation. No evidence of mitral valve stenosis. Tricuspid Valve: The tricuspid valve is normal in structure. Tricuspid valve regurgitation is mild . No evidence of tricuspid stenosis. Aortic Valve: The aortic valve is normal in structure. Aortic valve regurgitation is not visualized. No aortic stenosis is present. Pulmonic Valve: The pulmonic valve was normal in structure. Pulmonic valve  regurgitation is not visualized. No evidence of pulmonic stenosis. Aorta: The aortic root is normal in size and structure. Venous: The inferior vena cava is normal in size with greater than 50% respiratory variability, suggesting right atrial pressure of 3 mmHg. IAS/Shunts: No atrial level shunt detected by color flow Doppler.  LEFT VENTRICLE PLAX 2D LVIDd:         4.30 cm   Diastology LVIDs:         2.80 cm   LV e' medial:    3.70 cm/s LV PW:         0.80 cm   LV E/e' medial:  34.3 LV IVS:        0.90 cm   LV e' lateral:   9.90 cm/s LVOT diam:     1.60 cm   LV E/e' lateral: 12.8 LV SV:         40 LV SV Index:   23        2D Longitudinal Strain LVOT Area:     2.01 cm  2D Strain GLS Avg:     -7.1 %  RIGHT VENTRICLE             IVC RV Basal diam:  2.30 cm     IVC diam: 2.00 cm RV S prime:     11.40 cm/s TAPSE (M-mode): 1.8 cm LEFT ATRIUM             Index        RIGHT ATRIUM          Index LA diam:        3.20 cm 1.87 cm/m   RA Area:     9.39 cm LA Vol (A2C):   45.8 ml 26.82 ml/m  RA Volume:   20.90 ml 12.24 ml/m LA Vol (A4C):   25.5 ml 14.93 ml/m LA Biplane Vol: 34.1 ml 19.97 ml/m  AORTIC VALVE LVOT Vmax:   105.00 cm/s LVOT Vmean:  72.800 cm/s LVOT VTI:    0.199 m  AORTA Ao Root diam: 2.40 cm Ao Asc diam:  2.50 cm MITRAL VALVE                TRICUSPID VALVE MV Area (PHT): 5.02 cm     TR Peak grad:   41.2 mmHg MV Decel Time: 151 msec     TR Vmax:        321.00 cm/s MR Peak grad: 98.8 mmHg MR Mean grad: 65.0 mmHg     SHUNTS MR Vmax:      497.00 cm/s   Systemic VTI:  0.20 m MR Vmean:     385.0 cm/s    Systemic Diam: 1.60 cm MV E velocity: 127.00 cm/s MV A velocity: 103.00 cm/s MV E/A ratio:  1.23 Donato Schultz MD Electronically signed by Donato Schultz MD Signature Date/Time: 07/12/2022/3:50:42 PM    Final      Assessment and Plan:   1. Recurrent ABL anemia with GI bleeding - recent admission for the same in 04/2022 with GAVE, gastritis - presented with Hgb 6.1, melanotic stools - pending EGD 5/7, will review  recs with Dr. Tenny Craw  2. Chest pain with elevated troponin, possible NSTEMI, abnormal EKG - her symptoms are concerning to me for angina given her elevated troponin, ST depression on EKG, and slight decline in LVEF with +WMA. However, she is between a rock and a hard place because she is not presently a candidate for intervention should she have hemodynamically significant stenosis given ongoing GIB. There is a possibility this could also be mediated by severe anemia but pre-test probability of underlying CAD is high given her significant PAD hx - avoid heparin and antiplatelets at this time given #1 - statin intolerance noted on records, on Zetia/Fenofibrate PTA, resume when OK with primary team - hold  off BB due to soft BP at times, can consider adding low dose as this stabilizes - will review plans with MD   3. Heart murmur - noted on exam, no significant valvular disease on echo  4. PAD - off antiplatelets due to the above - can resume OP follow-up after discharge  5. CKD 3b - appears stable  Remainder of medical issues per IM  Risk Assessment/Risk Scores:     TIMI Risk Score for Unstable Angina or Non-ST Elevation MI:   The patient's TIMI risk score is 5, which indicates a 26% risk of all cause mortality, new or recurrent myocardial infarction or need for urgent revascularization in the next 14 days.    For questions or updates, please contact [facility] Please consult [email or web address] for contact info under    Signed, Laurann Montana, PA-C  07/14/2022 9:14 AM  Patient seen and examined   I agree with findings as noted above by D Dunn PT is a 82 yo with hx of HTN, HL, CKD, PAD (followed by Erlene Quan), GI bleeding.    Presented to ER from PCP with anemia    Hgb 6.1   Pt having melena.        Pt notes prior to admit had CP that was continuous     BUrning Did have some vomiting prior to admit Since admit troponins elevated with peak at 992.    Plavix held Pt received PrBC     Currently she is CP free   Tired    On exam,   Lungs are relatively CTA   Cardiac exam   RRR  Gr II/VI higher pitched mid peaking murmur RUSB    Chst:  Very tender to palpation of lower sternum   Abd is supple, nontender  Ext are without edema   Review of echo images, I think LVEF is lower than reported with severe hypokinesis of the lateral wall and distal portion of LV  Impression: NSTEMI    Pt faced  signficant stress with GI bleed and resultant severe anemia.Chest pain hx I got was atypical, constant pain/burning   D Dunn received a different Hx   may reflect angina I have reviewed echo and it is  not normal, will repeat limited with echo to define wall motion further   I think EF is lower than reported     For now recomm supportive care only   Keep Hgb over 8 to improve O2 delivery     Once Hgb stable, GI condition defined, could be considered for ischemic evluation with probable LHC.   AGain, needs to maintain Hgb and show no signs of bleeding    Probably after D/C  From a cardiac standpoint I would recommend proceeding with  GI evaluation.   There is some increased risk but overall GI issues need to be defined so that she can be cared for from a cardiac standpoint     Murmur on exam susp for AS   Review of echo shows thickening/calcification of valve but no significant stenosis.  Note that patient had myalgias with statin   Needs to be clarified   ? All agents tried     Check lipids again.Dietrich Pates MD

## 2022-07-14 NOTE — Plan of Care (Signed)

## 2022-07-14 NOTE — Progress Notes (Signed)
Gastroenterology Progress Note   Primary Gastroenterologist:  Dr. Marletta Lor  Patient ID: Charyl Bigger; [id number]; August 26, 1940    Subjective   Liquid BM this morning. Felt nauseated when NPO but doing well now on diet. Tolerating breakfast without difficulty. No vomiting.    Objective   Vital signs in last 24 hours Temp:  [98.3 F (36.8 C)-98.9 F (37.2 C)] 98.3 F (36.8 C) (05/06 0438) Pulse Rate:  [86-96] 95 (05/06 0438) Resp:  [18] 18 (05/06 0631) BP: (99-119)/(39-52) 118/52 (05/06 0631) SpO2:  [93 %-97 %] 93 % (05/06 0438) Last BM Date :  (unknown`)  Physical Exam General:   Alert and oriented, pleasant Head:  Normocephalic and atraumatic. Abdomen:  Bowel sounds present, soft, non-tender, non-distended.  Extremities:  Without edema. Neurologic:  Alert and  oriented x4    Intake/Output from previous day: 05/05 0701 - 05/06 0700 In: 780 [P.O.:780] Out: 600 [Urine:600] Intake/Output this shift: No intake/output data recorded.  Lab Results  Recent Labs    07/12/22 0245 07/12/22 0931 07/13/22 0511 07/14/22 0527  WBC 9.5  --  8.0 7.8  HGB 8.7* 8.5* 8.7* 9.0*  HCT 25.8* 25.6* 26.4* 27.1*  PLT 234  --  232 246   BMET Recent Labs    07/12/22 0245 07/13/22 0511 07/14/22 0527  NA 132* 131* 131*  K 3.9 3.6 3.8  CL 106 104 104  CO2 19* 21* 20*  GLUCOSE 175* 161* 91  BUN 21 20 16   CREATININE 1.35* 1.30* 1.27*  CALCIUM 8.6* 8.7* 8.5*   LFT Recent Labs    07/11/22 1521  PROT 6.7  ALBUMIN 2.9*  AST 34  ALT 13  ALKPHOS 56  BILITOT 2.7*   PT/INR Recent Labs    07/11/22 1521 07/12/22 0819  LABPROT 16.7* 17.0*  INR 1.4* 1.4*   Hepatitis Panel Recent Labs    07/13/22 0511  HEPBSAG NON REACTIVE  HCVAB NON REACTIVE  HEPAIGM NON REACTIVE  HEPBIGM NON REACTIVE     Studies/Results ECHOCARDIOGRAM COMPLETE  Result Date: 07/12/2022    ECHOCARDIOGRAM REPORT   Patient Name:   RHEMY LANNOM Date of Exam: 07/12/2022 Medical Rec #:  [id number]       Height:       65.0 in Accession #:    [id number]     Weight:       141.5 lb Date of Birth:  05-13-1940      BSA:          1.708 m Patient Age:    82 years       BP:           104/49 mmHg Patient Gender: F              HR:           97 bpm. [facility] Procedure: 2D Echo, Color Doppler and Cardiac Doppler Indications:    elevated troponin  History:        Patient has prior history of Echocardiogram examinations, most                 recent 04/28/2022. CAD, PAD and chronic kidney disease,                 Signs/Symptoms:Murmur; Risk Factors:Hypertension, Dyslipidemia,                 Diabetes and Sleep Apnea.  Sonographer:    Delcie Roch RDCS Referring Phys: [id number] Ocie Doyne D [facility] IMPRESSIONS  1. Left ventricular ejection fraction, by estimation, is 50 to 55%. The left ventricle has low normal function. The left ventricle demonstrates regional wall motion abnormalities (see scoring diagram/findings for description). Left ventricular diastolic  parameters are indeterminate. The average left ventricular global longitudinal strain is -7.1 %. The global longitudinal strain is abnormal.  2. Right ventricular systolic function is normal. The right ventricular size is normal. There is mildly elevated pulmonary artery systolic pressure. The estimated right ventricular systolic pressure is 44.2 mmHg.  3. The mitral valve is normal in structure. Mild mitral valve regurgitation. No evidence of mitral stenosis.  4. The aortic valve is normal in structure. Aortic valve regurgitation is not visualized. No aortic stenosis is present.  5. The inferior vena cava is normal in size with greater than 50% respiratory variability, suggesting right atrial pressure of 3 mmHg. Comparison(s): Prior images reviewed side by side. The left ventricular function is worsened. FINDINGS  Left Ventricle: Left ventricular ejection fraction, by estimation, is 50 to 55%. The left ventricle has low normal function. The left ventricle  demonstrates regional wall motion abnormalities. The average left ventricular global longitudinal strain is -7.1 %. The global longitudinal strain is abnormal. The left ventricular internal cavity size was normal in size. There is no left ventricular hypertrophy. Left ventricular diastolic parameters are indeterminate.  LV Wall Scoring: The apical septal segment is akinetic. Right Ventricle: The right ventricular size is normal. No increase in right ventricular wall thickness. Right ventricular systolic function is normal. There is mildly elevated pulmonary artery systolic pressure. The tricuspid regurgitant velocity is 3.21  m/s, and with an assumed right atrial pressure of 3 mmHg, the estimated right ventricular systolic pressure is 44.2 mmHg. Left Atrium: Left atrial size was normal in size. Right Atrium: Right atrial size was normal in size. Pericardium: There is no evidence of pericardial effusion. Mitral Valve: The mitral valve is normal in structure. Mild mitral valve regurgitation. No evidence of mitral valve stenosis. Tricuspid Valve: The tricuspid valve is normal in structure. Tricuspid valve regurgitation is mild . No evidence of tricuspid stenosis. Aortic Valve: The aortic valve is normal in structure. Aortic valve regurgitation is not visualized. No aortic stenosis is present. Pulmonic Valve: The pulmonic valve was normal in structure. Pulmonic valve regurgitation is not visualized. No evidence of pulmonic stenosis. Aorta: The aortic root is normal in size and structure. Venous: The inferior vena cava is normal in size with greater than 50% respiratory variability, suggesting right atrial pressure of 3 mmHg. IAS/Shunts: No atrial level shunt detected by color flow Doppler.  LEFT VENTRICLE PLAX 2D LVIDd:         4.30 cm   Diastology LVIDs:         2.80 cm   LV e' medial:    3.70 cm/s LV PW:         0.80 cm   LV E/e' medial:  34.3 LV IVS:        0.90 cm   LV e' lateral:   9.90 cm/s LVOT diam:     1.60 cm    LV E/e' lateral: 12.8 LV SV:         40 LV SV Index:   23        2D Longitudinal Strain LVOT Area:     2.01 cm  2D Strain GLS Avg:     -7.1 %  RIGHT VENTRICLE             IVC RV Basal diam:  2.30 cm     IVC diam: 2.00 cm RV S prime:     11.40 cm/s TAPSE (M-mode): 1.8 cm LEFT ATRIUM             Index        RIGHT ATRIUM          Index LA diam:        3.20 cm 1.87 cm/m   RA Area:     9.39 cm LA Vol (A2C):   45.8 ml 26.82 ml/m  RA Volume:   20.90 ml 12.24 ml/m LA Vol (A4C):   25.5 ml 14.93 ml/m LA Biplane Vol: 34.1 ml 19.97 ml/m  AORTIC VALVE LVOT Vmax:   105.00 cm/s LVOT Vmean:  72.800 cm/s LVOT VTI:    0.199 m  AORTA Ao Root diam: 2.40 cm Ao Asc diam:  2.50 cm MITRAL VALVE                TRICUSPID VALVE MV Area (PHT): 5.02 cm     TR Peak grad:   41.2 mmHg MV Decel Time: 151 msec     TR Vmax:        321.00 cm/s MR Peak grad: 98.8 mmHg MR Mean grad: 65.0 mmHg     SHUNTS MR Vmax:      497.00 cm/s   Systemic VTI:  0.20 m MR Vmean:     385.0 cm/s    Systemic Diam: 1.60 cm MV E velocity: 127.00 cm/s MV A velocity: 103.00 cm/s MV E/A ratio:  1.23 Donato Schultz MD Electronically signed by Donato Schultz MD Signature Date/Time: 07/12/2022/3:50:42 PM    Final     Assessment  82 y.o. female with a history of hyperlipidemia, hypertension, hypothyroidism, GERD, arthritis, diabetes, stroke, peripheral arterial disease on Plavix, uterine cancer and history of GAVE, presenting with melena and worsening acute on chronic anemia.  Acute on chronic anemia: Hgb 6.1 on admission, receiving 2 units PRBCs with improvement to 8.7. Hgb 9.0 today without overt GI bleeding. EGD Feb 2024 with GAVE s/p APC therapy. Colonoscopy 11/2020 with adenomas. Plavix has been on hold since admission with plans for tentative EGD on 5/7. However, awaiting Cardiology input due to possible NSTEMI.  Nodular hepatic contour: noted on CT abd/pelvis with and without contrast on 05/25/22 in Care Everywhere. Concern for underlying cirrhosis contributing to  GAVE. Tbili 2.7 on admission. Acute hepatitis panel negative. Will update HFP and INR to calculate MELD 3.0. Additional serologies for liver disease pending including AFP tumor marker.    Plan / Recommendations  HFP and INR Continue to hold Plavix Appreciate Cardiology consultation Follow H/H PPI BID Anticipate EGD on 5/7 if cleared by Cardiology NPO after midnight    LOS: 3 days    07/14/2022, 11:11 AM  Gelene Mink, PhD, ANP-BC [facility]

## 2022-07-14 NOTE — Progress Notes (Signed)
Pt ambulated to the restroom with a walker and had no issues. Pt has a large BM and states she is feeling much better. Pt was not on oxygen for this time, sats rechecked and pts O2 remained above 95%. Pt remains off O2 at this time. Will continue to monitor.

## 2022-07-14 NOTE — Progress Notes (Signed)
PROGRESS NOTE    Jessica Torres  ZOX:[id number] DOB: [birth year] DOA: 07/11/2022 PCP: Rebekah Chesterfield, NP   Brief Narrative:    Jessica Torres is a 82 y.o. female with medical history significant of peripheral artery disease on Plavix, GAVE, GERD, history of stroke, diabetes, hypertension, hypothyroidism.  Patient was sent to the emergency department by her PCP after blood work yesterday showed acute anemia with a reported hemoglobin of 4 point something.  She was noted to have hemoglobin of 6.1 and required 2 unit PRBC transfusion with improvement to 8.7 with stability noted.  She has significant troponin elevation likely in the setting of repeat type II NSTEMI in the setting of demand ischemia.  2D echocardiogram 5/5 with wall motion abnormalities noted.  Seen by cardiology 5/6 and GI planning for potential endoscopy 5/7.  Assessment & Plan:   Principal Problem:   Acute blood loss anemia Active Problems:   Upper GI bleeding   NSTEMI (non-ST elevated myocardial infarction) (HCC)   Heart failure with preserved ejection fraction (HCC)   DM (diabetes mellitus) (HCC)   Hypertension associated with diabetes (HCC)   Chronic kidney disease, stage III (moderate) (HCC)   Peripheral arterial disease - Bilateral SFA    Hypothyroidism   Coronary artery disease due to lipid rich plaque   GAVE (gastric antral vascular ectasia)   Cirrhosis of liver without ascites (HCC)  Assessment and Plan:  Acute blood loss anemia second to GAVE and GERD Protonix GI consulted with recommendations for cardiology clearance prior to EGD anticipated 5/6 Stopped Plavix Patient transfused 2 units, repeat transfusion for hemoglobin less than 8, currently stable Check CBC in the morning Started on soft diet and n.p.o. after midnight Diabetes Continue long-acting insulin, short acting insulin Hold short acting insulin while n.p.o. Hold Januvia Sliding scale insulin CBGs before meals and  nightly NSTEMI Transfuse to keep hemoglobin above 8 Follow troponins every 6 hours 2D echocardiogram with wall motion abnormalities noted 5/4 Appreciate cardiology ongoing evaluation Peripheral artery disease Holding Plavix currently Chronic kidney disease stage III Appears to be at baseline We will follow creatinine Hypothyroidism Continue levothyroxine  DVT prophylaxis:SCDs Code Status: Full Family Communication: None at bedside Disposition Plan:  Status is: Inpatient Remains inpatient appropriate because: Need for IV medications.   Consultants:  GI Discussed with Dr. Anne Fu 5/4  Procedures:  None  Antimicrobials:  None   Subjective: Patient seen and evaluated today with no new acute complaints or concerns. No acute concerns or events noted overnight.  She denies any abdominal pain, chest pain, or shortness of breath.  Objective: Vitals:   07/13/22 1208 07/13/22 2100 07/14/22 0438 07/14/22 0631  BP: (!) 99/44 (!) 104/51 (!) 119/39 (!) 118/52  Pulse: 96 86 95   Resp:  18 18 18   Temp: 98.7 F (37.1 C) 98.9 F (37.2 C) 98.3 F (36.8 C)   TempSrc: Oral Oral Oral   SpO2: 97% 96% 93%     Intake/Output Summary (Last 24 hours) at 07/14/2022 1016 Last data filed at 07/14/2022 0437 Gross per 24 hour  Intake 460 ml  Output 600 ml  Net -140 ml   There were no vitals filed for this visit.  Examination:  General exam: Appears calm and comfortable  Respiratory system: Clear to auscultation. Respiratory effort normal.  Nasal cannula oxygen Cardiovascular system: S1 & S2 heard, RRR.  Gastrointestinal system: Abdomen is soft Central nervous system: Alert and awake Extremities: No edema Skin: No significant lesions noted Psychiatry: Flat affect.  Data Reviewed: I have personally reviewed following labs and imaging studies  CBC: Recent Labs  Lab 07/11/22 1521 07/12/22 0245 07/12/22 0931 07/13/22 0511 07/14/22 0527  WBC 8.1 9.5  --  8.0 7.8  NEUTROABS 5.3   --   --   --   --   HGB 6.1* 8.7* 8.5* 8.7* 9.0*  HCT 19.2* 25.8* 25.6* 26.4* 27.1*  MCV 91.0 88.1  --  88.9 90.3  PLT 270 234  --  232 246   Basic Metabolic Panel: Recent Labs  Lab 07/11/22 1521 07/12/22 0245 07/13/22 0511 07/14/22 0527  NA 132* 132* 131* 131*  K 4.1 3.9 3.6 3.8  CL 104 106 104 104  CO2 19* 19* 21* 20*  GLUCOSE 191* 175* 161* 91  BUN 22 21 20 16   CREATININE 1.40* 1.35* 1.30* 1.27*  CALCIUM 9.3 8.6* 8.7* 8.5*  MG  --   --  1.8 1.8   GFR: CrCl cannot be calculated (Unknown ideal weight.). Liver Function Tests: Recent Labs  Lab 07/11/22 1521  AST 34  ALT 13  ALKPHOS 56  BILITOT 2.7*  PROT 6.7  ALBUMIN 2.9*   Recent Labs  Lab 07/11/22 1521  LIPASE 35   No results for input(s): "AMMONIA" in the last 168 hours. Coagulation Profile: Recent Labs  Lab 07/11/22 1521 07/12/22 0819  INR 1.4* 1.4*   Cardiac Enzymes: No results for input(s): "CKTOTAL", "CKMB", "CKMBINDEX", "TROPONINI" in the last 168 hours. BNP (last 3 results) No results for input(s): "PROBNP" in the last 8760 hours. HbA1C: No results for input(s): "HGBA1C" in the last 72 hours. CBG: Recent Labs  Lab 07/13/22 0743 07/13/22 1125 07/13/22 1625 07/13/22 2057 07/14/22 0736  GLUCAP 141* 188* 85 117* 102*   Lipid Profile: No results for input(s): "CHOL", "HDL", "LDLCALC", "TRIG", "CHOLHDL", "LDLDIRECT" in the last 72 hours. Thyroid Function Tests: No results for input(s): "TSH", "T4TOTAL", "FREET4", "T3FREE", "THYROIDAB" in the last 72 hours. Anemia Panel: No results for input(s): "VITAMINB12", "FOLATE", "FERRITIN", "TIBC", "IRON", "RETICCTPCT" in the last 72 hours. Sepsis Labs: No results for input(s): "PROCALCITON", "LATICACIDVEN" in the last 168 hours.  No results found for this or any previous visit (from the past 240 hour(s)).       Radiology Studies: ECHOCARDIOGRAM COMPLETE  Result Date: 07/12/2022    ECHOCARDIOGRAM REPORT   Patient Name:   Jessica Torres Date of  Exam: 07/12/2022 Medical Rec #:  [id number]      Height:       65.0 in Accession #:    [id number]     Weight:       141.5 lb Date of Birth:  1940/07/21      BSA:          1.708 m Patient Age:    89 years       BP:           104/49 mmHg Patient Gender: F              HR:           97 bpm. [facility] Procedure: 2D Echo, Color Doppler and Cardiac Doppler Indications:    elevated troponin  History:        Patient has prior history of Echocardiogram examinations, most                 recent 04/28/2022. CAD, PAD and chronic kidney disease,  Signs/Symptoms:Murmur; Risk Factors:Hypertension, Dyslipidemia,                 Diabetes and Sleep Apnea.  Sonographer:    Delcie Roch RDCS Referring Phys: [id number] Serrina Minogue D [facility] IMPRESSIONS  1. Left ventricular ejection fraction, by estimation, is 50 to 55%. The left ventricle has low normal function. The left ventricle demonstrates regional wall motion abnormalities (see scoring diagram/findings for description). Left ventricular diastolic  parameters are indeterminate. The average left ventricular global longitudinal strain is -7.1 %. The global longitudinal strain is abnormal.  2. Right ventricular systolic function is normal. The right ventricular size is normal. There is mildly elevated pulmonary artery systolic pressure. The estimated right ventricular systolic pressure is 44.2 mmHg.  3. The mitral valve is normal in structure. Mild mitral valve regurgitation. No evidence of mitral stenosis.  4. The aortic valve is normal in structure. Aortic valve regurgitation is not visualized. No aortic stenosis is present.  5. The inferior vena cava is normal in size with greater than 50% respiratory variability, suggesting right atrial pressure of 3 mmHg. Comparison(s): Prior images reviewed side by side. The left ventricular function is worsened. FINDINGS  Left Ventricle: Left ventricular ejection fraction, by estimation, is 50 to 55%. The left ventricle  has low normal function. The left ventricle demonstrates regional wall motion abnormalities. The average left ventricular global longitudinal strain is -7.1 %. The global longitudinal strain is abnormal. The left ventricular internal cavity size was normal in size. There is no left ventricular hypertrophy. Left ventricular diastolic parameters are indeterminate.  LV Wall Scoring: The apical septal segment is akinetic. Right Ventricle: The right ventricular size is normal. No increase in right ventricular wall thickness. Right ventricular systolic function is normal. There is mildly elevated pulmonary artery systolic pressure. The tricuspid regurgitant velocity is 3.21  m/s, and with an assumed right atrial pressure of 3 mmHg, the estimated right ventricular systolic pressure is 44.2 mmHg. Left Atrium: Left atrial size was normal in size. Right Atrium: Right atrial size was normal in size. Pericardium: There is no evidence of pericardial effusion. Mitral Valve: The mitral valve is normal in structure. Mild mitral valve regurgitation. No evidence of mitral valve stenosis. Tricuspid Valve: The tricuspid valve is normal in structure. Tricuspid valve regurgitation is mild . No evidence of tricuspid stenosis. Aortic Valve: The aortic valve is normal in structure. Aortic valve regurgitation is not visualized. No aortic stenosis is present. Pulmonic Valve: The pulmonic valve was normal in structure. Pulmonic valve regurgitation is not visualized. No evidence of pulmonic stenosis. Aorta: The aortic root is normal in size and structure. Venous: The inferior vena cava is normal in size with greater than 50% respiratory variability, suggesting right atrial pressure of 3 mmHg. IAS/Shunts: No atrial level shunt detected by color flow Doppler.  LEFT VENTRICLE PLAX 2D LVIDd:         4.30 cm   Diastology LVIDs:         2.80 cm   LV e' medial:    3.70 cm/s LV PW:         0.80 cm   LV E/e' medial:  34.3 LV IVS:        0.90 cm   LV e'  lateral:   9.90 cm/s LVOT diam:     1.60 cm   LV E/e' lateral: 12.8 LV SV:         40 LV SV Index:   23        2D Longitudinal  Strain LVOT Area:     2.01 cm  2D Strain GLS Avg:     -7.1 %  RIGHT VENTRICLE             IVC RV Basal diam:  2.30 cm     IVC diam: 2.00 cm RV S prime:     11.40 cm/s TAPSE (M-mode): 1.8 cm LEFT ATRIUM             Index        RIGHT ATRIUM          Index LA diam:        3.20 cm 1.87 cm/m   RA Area:     9.39 cm LA Vol (A2C):   45.8 ml 26.82 ml/m  RA Volume:   20.90 ml 12.24 ml/m LA Vol (A4C):   25.5 ml 14.93 ml/m LA Biplane Vol: 34.1 ml 19.97 ml/m  AORTIC VALVE LVOT Vmax:   105.00 cm/s LVOT Vmean:  72.800 cm/s LVOT VTI:    0.199 m  AORTA Ao Root diam: 2.40 cm Ao Asc diam:  2.50 cm MITRAL VALVE                TRICUSPID VALVE MV Area (PHT): 5.02 cm     TR Peak grad:   41.2 mmHg MV Decel Time: 151 msec     TR Vmax:        321.00 cm/s MR Peak grad: 98.8 mmHg MR Mean grad: 65.0 mmHg     SHUNTS MR Vmax:      497.00 cm/s   Systemic VTI:  0.20 m MR Vmean:     385.0 cm/s    Systemic Diam: 1.60 cm MV E velocity: 127.00 cm/s MV A velocity: 103.00 cm/s MV E/A ratio:  1.23 Donato Schultz MD Electronically signed by Donato Schultz MD Signature Date/Time: 07/12/2022/3:50:42 PM    Final         Scheduled Meds:  insulin aspart  0-15 Units Subcutaneous TID WC   insulin aspart  0-5 Units Subcutaneous QHS   insulin aspart  10 Units Subcutaneous TID WC   insulin glargine-yfgn  52 Units Subcutaneous QHS   levothyroxine  88 mcg Oral QAC breakfast   loratadine  10 mg Oral Daily   pantoprazole (PROTONIX) IV  40 mg Intravenous Q12H   polyethylene glycol  17 g Oral Daily     LOS: 3 days    Time spent: 35 minutes    Salvator Seppala Hoover Brunette, DO [facility]  If 7PM-7AM, please contact night-coverage [email or web address] 07/14/2022, 10:16 AM

## 2022-07-15 ENCOUNTER — Inpatient Hospital Stay (HOSPITAL_COMMUNITY): Payer: Medicare HMO | Admitting: Anesthesiology

## 2022-07-15 ENCOUNTER — Encounter (HOSPITAL_COMMUNITY)
Admission: EM | Disposition: A | Discharge status: Home / Self Care | Payer: Self-pay | Source: Home / Self Care | Attending: Internal Medicine

## 2022-07-15 ENCOUNTER — Encounter (HOSPITAL_COMMUNITY): Payer: Self-pay | Admitting: Family Medicine

## 2022-07-15 DIAGNOSIS — K767 Hepatorenal syndrome: Secondary | ICD-10-CM

## 2022-07-15 DIAGNOSIS — I252 Old myocardial infarction: Secondary | ICD-10-CM

## 2022-07-15 DIAGNOSIS — K921 Melena: Secondary | ICD-10-CM | POA: Diagnosis not present

## 2022-07-15 DIAGNOSIS — K317 Polyp of stomach and duodenum: Secondary | ICD-10-CM

## 2022-07-15 DIAGNOSIS — K3189 Other diseases of stomach and duodenum: Secondary | ICD-10-CM

## 2022-07-15 DIAGNOSIS — D62 Acute posthemorrhagic anemia: Secondary | ICD-10-CM | POA: Diagnosis not present

## 2022-07-15 DIAGNOSIS — D649 Anemia, unspecified: Secondary | ICD-10-CM | POA: Diagnosis not present

## 2022-07-15 DIAGNOSIS — I2510 Atherosclerotic heart disease of native coronary artery without angina pectoris: Secondary | ICD-10-CM

## 2022-07-15 DIAGNOSIS — I8500 Esophageal varices without bleeding: Secondary | ICD-10-CM

## 2022-07-15 DIAGNOSIS — K766 Portal hypertension: Secondary | ICD-10-CM

## 2022-07-15 DIAGNOSIS — D509 Iron deficiency anemia, unspecified: Secondary | ICD-10-CM

## 2022-07-15 DIAGNOSIS — I10 Essential (primary) hypertension: Secondary | ICD-10-CM

## 2022-07-15 DIAGNOSIS — E1151 Type 2 diabetes mellitus with diabetic peripheral angiopathy without gangrene: Secondary | ICD-10-CM

## 2022-07-15 DIAGNOSIS — I214 Non-ST elevation (NSTEMI) myocardial infarction: Secondary | ICD-10-CM

## 2022-07-15 HISTORY — PX: HOT HEMOSTASIS: SHX5433

## 2022-07-15 HISTORY — PX: ESOPHAGOGASTRODUODENOSCOPY (EGD) WITH PROPOFOL: SHX5813

## 2022-07-15 HISTORY — PX: POLYPECTOMY: SHX149

## 2022-07-15 LAB — BASIC METABOLIC PANEL
Anion gap: 7 (ref 5–15)
BUN: 19 mg/dL (ref 8–23)
CO2: 20 mmol/L — ABNORMAL LOW (ref 22–32)
Calcium: 8.8 mg/dL — ABNORMAL LOW (ref 8.9–10.3)
Chloride: 104 mmol/L (ref 98–111)
Creatinine, Ser: 1.41 mg/dL — ABNORMAL HIGH (ref 0.44–1.00)
GFR, Estimated: 38 mL/min — ABNORMAL LOW (ref >60)
Glucose, Bld: 196 mg/dL — ABNORMAL HIGH (ref 70–99)
Potassium: 4.2 mmol/L (ref 3.5–5.1)
Sodium: 131 mmol/L — ABNORMAL LOW (ref 135–145)

## 2022-07-15 LAB — GLUCOSE, CAPILLARY
Glucose-Capillary: 140 mg/dL — ABNORMAL HIGH (ref 70–99)
Glucose-Capillary: 109 mg/dL — ABNORMAL HIGH (ref 70–99)
Glucose-Capillary: 115 mg/dL — ABNORMAL HIGH (ref 70–99)
Glucose-Capillary: 109 mg/dL — ABNORMAL HIGH (ref 70–99)
Glucose-Capillary: 202 mg/dL — ABNORMAL HIGH (ref 70–99)
Glucose-Capillary: 157 mg/dL — ABNORMAL HIGH (ref 70–99)

## 2022-07-15 LAB — LIPID PANEL
Cholesterol: 92 mg/dL (ref 0–200)
HDL: 11 mg/dL — ABNORMAL LOW (ref >40)
LDL Cholesterol: 57 mg/dL (ref 0–99)
Total CHOL/HDL Ratio: 8.4 RATIO
Triglycerides: 122 mg/dL (ref <150)
VLDL: 24 mg/dL (ref 0–40)

## 2022-07-15 LAB — CBC
HCT: 26.8 % — ABNORMAL LOW (ref 36.0–46.0)
Hemoglobin: 8.6 g/dL — ABNORMAL LOW (ref 12.0–15.0)
MCH: 29.1 pg (ref 26.0–34.0)
MCHC: 32.1 g/dL (ref 30.0–36.0)
MCV: 90.5 fL (ref 80.0–100.0)
Platelets: 238 10*3/uL (ref 150–400)
RBC: 2.96 MIL/uL — ABNORMAL LOW (ref 3.87–5.11)
RDW: 15.8 % — ABNORMAL HIGH (ref 11.5–15.5)
WBC: 8.4 10*3/uL (ref 4.0–10.5)
nRBC: 0 % (ref 0.0–0.2)

## 2022-07-15 LAB — AFP TUMOR MARKER: AFP, Serum, Tumor Marker: 3.2 ng/mL (ref 0.0–9.2)

## 2022-07-15 LAB — ALPHA-1-ANTITRYPSIN: A-1 Antitrypsin, Ser: 217 mg/dL — ABNORMAL HIGH (ref 101–187)

## 2022-07-15 LAB — IGG: IgG (Immunoglobin G), Serum: 1200 mg/dL (ref 586–1602)

## 2022-07-15 LAB — MAGNESIUM: Magnesium: 1.9 mg/dL (ref 1.7–2.4)

## 2022-07-15 SURGERY — ESOPHAGOGASTRODUODENOSCOPY (EGD) WITH PROPOFOL
Anesthesia: General

## 2022-07-15 MED ORDER — DAPAGLIFLOZIN PROPANEDIOL 10 MG PO TABS
10.0000 mg | ORAL_TABLET | Freq: Every day | ORAL | Status: DC
  Administered 2022-07-15 – 2022-07-17 (×4): 10 mg via ORAL
  Filled 2022-07-15 (×3): qty 1

## 2022-07-15 MED ORDER — LIDOCAINE HCL (CARDIAC) PF 100 MG/5ML IV SOSY
PREFILLED_SYRINGE | INTRAVENOUS | Status: DC | PRN
  Administered 2022-07-15: 60 mg via INTRAVENOUS

## 2022-07-15 MED ORDER — SODIUM CHLORIDE 0.9 % IV SOLN: INTRAVENOUS | Status: DC

## 2022-07-15 MED ORDER — ONDANSETRON HCL 4 MG/2ML IJ SOLN
INTRAMUSCULAR | Status: AC
  Filled 2022-07-15: qty 2

## 2022-07-15 MED ORDER — INSULIN ASPART 100 UNIT/ML IJ SOLN
5.0000 [IU] | Freq: Three times a day (TID) | INTRAMUSCULAR | Status: DC
  Administered 2022-07-15 – 2022-07-17 (×4): 5 [IU] via SUBCUTANEOUS

## 2022-07-15 MED ORDER — EZETIMIBE 10 MG PO TABS
10.0000 mg | ORAL_TABLET | Freq: Every day | ORAL | Status: DC
  Administered 2022-07-15 – 2022-07-17 (×3): 10 mg via ORAL
  Filled 2022-07-15 (×3): qty 1

## 2022-07-15 MED ORDER — LACTATED RINGERS IV SOLN: INTRAVENOUS | Status: DC

## 2022-07-15 MED ORDER — PROPOFOL 10 MG/ML IV BOLUS
INTRAVENOUS | Status: DC | PRN
  Administered 2022-07-15 (×2): 20 mg via INTRAVENOUS
  Administered 2022-07-15: 80 mg via INTRAVENOUS
  Administered 2022-07-15: 40 mg via INTRAVENOUS

## 2022-07-15 NOTE — Anesthesia Procedure Notes (Signed)
Date/Time: 07/15/2022 11:29 AM  Performed by: Franco Nones, CRNAPre-anesthesia Checklist: Patient identified, Emergency Drugs available, Suction available, Timeout performed and Patient being monitored Patient Re-evaluated:Patient Re-evaluated prior to induction Oxygen Delivery Method: Nasal Cannula

## 2022-07-15 NOTE — Anesthesia Preprocedure Evaluation (Signed)
Anesthesia Evaluation  Patient identified by MRN, date of birth, ID band Patient awake    Reviewed: Allergy & Precautions, H&P , NPO status , Patient's Chart, lab work & pertinent test results, reviewed documented beta blocker date and time   Airway Mallampati: II  TM Distance: >3 FB Neck ROM: full    Dental no notable dental hx.    Pulmonary neg pulmonary ROS, sleep apnea    Pulmonary exam normal breath sounds clear to auscultation       Cardiovascular Exercise Tolerance: Good hypertension, + CAD, + Past MI and + Peripheral Vascular Disease  negative cardio ROS + Valvular Problems/Murmurs  Rhythm:regular Rate:Normal     Neuro/Psych  Neuromuscular disease CVA negative neurological ROS  negative psych ROS   GI/Hepatic negative GI ROS, Neg liver ROS,GERD  ,,  Endo/Other  negative endocrine ROSdiabetesHypothyroidism    Renal/GU Renal diseasenegative Renal ROS  negative genitourinary   Musculoskeletal   Abdominal   Peds  Hematology negative hematology ROS (+) Blood dyscrasia, anemia   Anesthesia Other Findings NSTEMI in the setting of severe anemia EF 45-50%   Reproductive/Obstetrics negative OB ROS                             Anesthesia Physical Anesthesia Plan  ASA: 4 and emergent  Anesthesia Plan: General   Post-op Pain Management:    Induction:   PONV Risk Score and Plan: Propofol infusion  Airway Management Planned:   Additional Equipment:   Intra-op Plan:   Post-operative Plan:   Informed Consent: I have reviewed the patients History and Physical, chart, labs and discussed the procedure including the risks, benefits and alternatives for the proposed anesthesia with the patient or authorized representative who has indicated his/her understanding and acceptance.     Dental Advisory Given  Plan Discussed with: CRNA  Anesthesia Plan Comments:        Anesthesia  Quick Evaluation

## 2022-07-15 NOTE — Brief Op Note (Signed)
07/15/2022  11:55 AM  PATIENT:  Jessica Torres  82 y.o. female  PRE-OPERATIVE DIAGNOSIS:  acute on chronic anemia, melena  POST-OPERATIVE DIAGNOSIS:  Hypertensive Portal Gastropathy, GAVE, Grade I Esophageal Varies, APC treatment of GAVE,Gastric Polyp  PROCEDURE:  Procedure(s): ESOPHAGOGASTRODUODENOSCOPY (EGD) WITH PROPOFOL (N/A) HOT HEMOSTASIS (ARGON PLASMA COAGULATION/BICAP) POLYPECTOMY INTESTINAL  SURGEON:  Surgeon(s) and Role:    * Dolores Frame, MD - Primary  Patient underwent EGD under propofol sedation.  Tolerated the procedure adequately.   FINDINGS: - Grade I esophageal varices.  - Portal hypertensive gastropathy.  - A single gastric polyp.  Resected and retrieved.  - Gastric antral vascular ectasia without bleeding.  Treated with argon plasma coagulation (APC).  - Normal examined duodenum.   RECOMMENDATIONS - Return patient to hospital ward for ongoing care.  - Resume previous diet.  - Await pathology results.  - Use Protonix (pantoprazole) 40 mg PO BID.  - Restart Plavix tomorrow. - Daily H/H. - Repeat CBC in 2 weeks, if recurrent anemia may consider capsule endoscopy. - Consider repeat EGD in 4-6 weeks to assess GAVE. If not pursuing this, may repeat EGD in 2 years for esophageal varices surveillance.  [name], MD Gastroenterology and Hepatology [facility]

## 2022-07-15 NOTE — Progress Notes (Signed)
Rounding Note    Patient Name: SELENIA NOONE Date of Encounter: 07/15/2022  [facility] Cardiologist: Nanetta Batty, MD   Subjective   No complaints  Inpatient Medications    Scheduled Meds:  insulin aspart  0-15 Units Subcutaneous TID WC   insulin aspart  0-5 Units Subcutaneous QHS   insulin aspart  10 Units Subcutaneous TID WC   insulin glargine-yfgn  52 Units Subcutaneous QHS   levothyroxine  88 mcg Oral QAC breakfast   loratadine  10 mg Oral Daily   pantoprazole (PROTONIX) IV  40 mg Intravenous Q12H   polyethylene glycol  17 g Oral Daily   Continuous Infusions:  PRN Meds: acetaminophen, ondansetron **OR** ondansetron (ZOFRAN) IV, phenol   Vital Signs    Vitals:   07/14/22 0631 07/14/22 1403 07/14/22 2110 07/15/22 0422  BP: (!) 118/52 (!) 107/49 (!) 117/53 (!) 104/34  Pulse:  92 98 97  Resp: 18 18 20 16   Temp:  98.4 F (36.9 C) 98.7 F (37.1 C) 98.7 F (37.1 C)  TempSrc:   Oral Oral  SpO2:  94% 95% 92%    Intake/Output Summary (Last 24 hours) at 07/15/2022 0846 Last data filed at 07/15/2022 0400 Gross per 24 hour  Intake 360 ml  Output --  Net 360 ml      04/30/2022    4:00 AM 04/29/2022   11:53 PM 04/29/2022    3:48 AM  Last 3 Weights  Weight (lbs) 141 lb 8.6 oz 141 lb 8.6 oz 140 lb 14 oz  Weight (kg) 64.2 kg 64.2 kg 63.9 kg      Telemetry    NSR - Personally Reviewed  ECG    N/a - Personally Reviewed  Physical Exam   GEN: No acute distress.   Neck: No JVD Cardiac: RRR, no murmurs, rubs, or gallops.  Respiratory: Clear to auscultation bilaterally. GI: Soft, nontender, non-distended  MS: No edema; No deformity. Neuro:  Nonfocal  Psych: Normal affect   Labs    High Sensitivity Troponin:   Recent Labs  Lab 07/12/22 2245 07/13/22 0511 07/13/22 1103 07/13/22 1700 07/13/22 2256  TROPONINIHS 647* 677* 554* 615* 619*     Chemistry Recent Labs  Lab 07/11/22 1521 07/12/22 0245 07/13/22 0511 07/14/22 0527  07/14/22 1148 07/15/22 0417  NA 132*   < > 131* 131*  --  131*  K 4.1   < > 3.6 3.8  --  4.2  CL 104   < > 104 104  --  104  CO2 19*   < > 21* 20*  --  20*  GLUCOSE 191*   < > 161* 91  --  196*  BUN 22   < > 20 16  --  19  CREATININE 1.40*   < > 1.30* 1.27*  --  1.41*  CALCIUM 9.3   < > 8.7* 8.5*  --  8.8*  MG  --   --  1.8 1.8  --  1.9  PROT 6.7  --   --   --  5.7*  --   ALBUMIN 2.9*  --   --   --  2.2*  --   AST 34  --   --   --  40  --   ALT 13  --   --   --  12  --   ALKPHOS 56  --   --   --  50  --   BILITOT 2.7*  --   --   --  2.7*  --   GFRNONAA 38*   < > 42* 43*  --  38*  ANIONGAP 9   < > 6 7  --  7   < > = values in this interval not displayed.    Lipids  Recent Labs  Lab 07/15/22 0417  CHOL 92  TRIG 122  HDL 11*  LDLCALC 57  CHOLHDL 8.4    Hematology Recent Labs  Lab 07/13/22 0511 07/14/22 0527 07/15/22 0417  WBC 8.0 7.8 8.4  RBC 2.97* 3.00* 2.96*  HGB 8.7* 9.0* 8.6*  HCT 26.4* 27.1* 26.8*  MCV 88.9 90.3 90.5  MCH 29.3 30.0 29.1  MCHC 33.0 33.2 32.1  RDW 15.7* 15.4 15.8*  PLT 232 246 238   Thyroid No results for input(s): "TSH", "FREET4" in the last 168 hours.  BNPNo results for input(s): "BNP", "PROBNP" in the last 168 hours.  DDimer No results for input(s): "DDIMER" in the last 168 hours.   Radiology    ECHOCARDIOGRAM LIMITED  Result Date: 07/14/2022    ECHOCARDIOGRAM LIMITED REPORT   Patient Name:   NECHUMA FAUBION Date of Exam: 07/14/2022 Medical Rec #:  [id number]      Height:       65.0 in Accession #:    [id number]     Weight:       141.5 lb Date of Birth:  September 08, 1940      BSA:          1.708 m Patient Age:    82 years       BP:           107/49 mmHg Patient Gender: F              HR:           87 bpm. [facility] Procedure: Limited Echo Indications:    Elevated Troponin  History:        Patient has prior history of Echocardiogram examinations, most                 recent 07/12/2022. CAD, Signs/Symptoms:Murmur; Risk                  Factors:Hypertension, Diabetes and Dyslipidemia. Sleep Apnea.  Sonographer:    Celesta Gentile RCS Referring Phys: [id number] PAULA V ROSS IMPRESSIONS  1. Limited echo with Definity. LVEF is depressed with hypokinesis of the mid/distal lateral, mid/distal septal, distal inferior and apical walls. Overall LVEF approximately 40 to 45%. FINDINGS  Left Ventricle: Limited echo with Definity. LVEF is depressed with hypokinesis of the mid/distal lateral, mid/distal septal, distal inferior and apical walls. Overall LVEF approximately 40 to 45%. Definity contrast agent was given IV to delineate the left ventricular endocardial borders. Dietrich Pates MD Electronically signed by Dietrich Pates MD Signature Date/Time: 07/14/2022/9:09:17 PM    Final     Cardiac Studies     Patient Profile     JARICA VANDEKAMP is a 82 y.o. 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 Sherryll Burger.   Assessment & Plan    1.NSTEMI - in setting of GI bleed, Hgb down ti 6.1 with melanotic stools - peak trop 992 trending down. EKG inferior and anterolateral  ST depressions though some degree of chronicity. Chest pain symptoms were mixed - echo LVEF 50-55%, apical segment is akinetic.  - repeat limited echo LVE 40-45%, apical hypokinesis.   - not cath candidate at this time given GI bleed, severe anemia - no ASA/heparin due to bleed. No beta blocker/ACE/ARB given soft bp's. - statin intolerance, on zetia at home can continue - keep Hgb 8 or above  2. HFmrEF - - echo LVEF 50-55%, apical segment is akinetic.  - repeat limited echo LVE 40-45%, apical hypokinesis.  - could be a takotsubo pattern but cannot exclude ischemic cardiomyopathy - soft bp's would limit  medical therapy. Start farxiga 10mg  daily.   3. Hyperlipidemia - statin intolerant - LDL is 57 here, actually doing well on zetia and fenofibrate. Can continue.   4. GI bleed - admit Hgb 6.1 - transfused 2 units - plans for EGD today  5. PAD - prior bilateral LE interventions with procedures back in 2015, 2016, and 2018. She was previously referred to vascular surgery for consideration of left fem-pop but felt to be poor candidate due to lack of venous conduit.  - plavix on hold   For questions or updates, please contact [facility] Please consult [email or web address] for contact info under        Signed, Dina Rich, MD  07/15/2022, 8:46 AM

## 2022-07-15 NOTE — Progress Notes (Signed)
We will proceed with EGD as scheduled.  I thoroughly discussed with the patient the procedure, including the risks involved. Patient understands what the procedure involves including the benefits and any risks. Patient understands alternatives to the proposed procedure. Risks including (but not limited to) bleeding, tearing of the lining (perforation), rupture of adjacent organs, problems with heart and lung function, infection, and medication reactions. A small percentage of complications may require surgery, hospitalization, repeat endoscopic procedure, and/or transfusion.  Patient understood and agreed.  Naydelin Ziegler Castaneda, MD Gastroenterology and Hepatology [facility]  

## 2022-07-15 NOTE — Transfer of Care (Signed)
Immediate Anesthesia Transfer of Care Note  [name]  Procedure(s) Performed: ESOPHAGOGASTRODUODENOSCOPY (EGD) WITH PROPOFOL HOT HEMOSTASIS (ARGON PLASMA COAGULATION/BICAP) POLYPECTOMY INTESTINAL  Patient Location: PACU  Anesthesia Type:General  Level of Consciousness: sedated  Airway & Oxygen Therapy: Patient Spontanous Breathing  Post-op Assessment: Report given to RN and Post -op Vital signs reviewed and stable  Post vital signs: Reviewed and stable  Last Vitals:  Vitals Value Taken Time  BP 128/109 07/15/22 1155  Temp 98 07/15/22 1155  Pulse 94 07/15/22 1156  Resp 18 07/15/22 1156  SpO2 98 % 07/15/22 1156  Vitals shown include unvalidated device data.  Last Pain:  Vitals:   07/15/22 1130  TempSrc:   PainSc: 5          Complications: No notable events documented.

## 2022-07-15 NOTE — Inpatient Diabetes Management (Signed)
Inpatient Diabetes Program Recommendations  AACE/ADA: New Consensus Statement on Inpatient Glycemic Control (2015)  Target Ranges:  Prepandial:   less than 140 mg/dL      Peak postprandial:   less than 180 mg/dL (1-2 hours)      Critically ill patients:  140 - 180 mg/dL   Lab Results  Component Value Date   GLUCAP 109 (H) 07/15/2022   HGBA1C 6.6 (H) 04/27/2022    Diabetes history: DM2 Outpatient Diabetes medications: Toujeo 52 units qhs (med rec lists pt. Taking only 26), Humalog 10-20 units tid meal coverage Current orders for Inpatient glycemic control: Semglee 52 units qd, Novolog 10 units tid meal coverage, Novolog 0-15 units tid, 0-5 units hs correction  Inpatient Diabetes Program Recommendations:   Patient currently having upper endoscopy. Please consider: -Decrease Novolog meal coverage when eating to 5 units tid if eats 50%  Thank you, Darel Hong E. Amerika Nourse, RN, MSN, CDE  Diabetes Coordinator Inpatient Glycemic Control Team Team Pager [phone] (8am-5pm) 07/15/2022 12:04 PM

## 2022-07-15 NOTE — Progress Notes (Signed)
PROGRESS NOTE    Jessica Torres  YQM:[id number] DOB: 1941-01-10 DOA: 07/11/2022 PCP: Rebekah Chesterfield, NP   Brief Narrative:    Jessica Torres is a 82 y.o. female with medical history significant of peripheral artery disease on Plavix, GAVE, GERD, history of stroke, diabetes, hypertension, hypothyroidism.  Patient was sent to the emergency department by her PCP after blood work yesterday showed acute anemia with a reported hemoglobin of 4 point something.  She was noted to have hemoglobin of 6.1 and required 2 unit PRBC transfusion with improvement to 8.7 with stability noted.  She has significant troponin elevation likely in the setting of repeat type II NSTEMI in the setting of demand ischemia.  2D echocardiogram 5/5 with wall motion abnormalities noted.  Seen by cardiology 5/6 and GI performed EGD 5/7 with findings of grade 1 varices and GAVE status post APC.  Patient also noted to have portal hypertension.  Plan to resume diet and follow H/H.  Assessment & Plan:   Principal Problem:   Acute blood loss anemia Active Problems:   Upper GI bleeding   NSTEMI (non-ST elevated myocardial infarction) (HCC)   Heart failure with preserved ejection fraction (HCC)   DM (diabetes mellitus) (HCC)   Hypertension associated with diabetes (HCC)   Chronic kidney disease, stage III (moderate) (HCC)   Peripheral arterial disease - Bilateral SFA    Hypothyroidism   Coronary artery disease due to lipid rich plaque   GAVE (gastric antral vascular ectasia)   Cirrhosis of liver without ascites (HCC)   Symptomatic anemia   Melena  Assessment and Plan:  Acute blood loss anemia secondary to GAVE and GERD Protonix to continue twice daily Status post EGD 5/7 with plans to advance diet and monitor H/H May resume Plavix 5/8 Patient transfused 2 units this admission and CBC currently stable Check CBC in the morning Started on heart healthy diet Diabetes Continue long-acting insulin, short acting  insulin Hold short acting insulin while n.p.o. Hold Januvia Sliding scale insulin CBGs before meals and nightly NSTEMI Transfuse to keep hemoglobin above 8 Follow troponins every 6 hours 2D echocardiogram with wall motion abnormalities noted 5/4 Appreciate cardiology ongoing evaluation with initiation of Farxiga 10 mg daily today.  Plan to schedule outpatient ischemic evaluation after discharge Peripheral artery disease Resume Plavix 5/8 Chronic kidney disease stage III Appears to be at baseline We will follow creatinine Hypothyroidism Continue levothyroxine  DVT prophylaxis:SCDs Code Status: Full Family Communication: None at bedside Disposition Plan:  Status is: Inpatient Remains inpatient appropriate because: Need for IV medications.   Consultants:  GI Cardiology  Procedures:  EGD 5/7  Antimicrobials:  None   Subjective: Patient seen and evaluated today with no new acute complaints or concerns. No acute concerns or events noted overnight.  She denies any abdominal pain, chest pain, or shortness of breath.  Objective: Vitals:   07/15/22 1155 07/15/22 1200 07/15/22 1203 07/15/22 1207  BP:  (!) 89/42 (!) 69/36 (!) 97/46  Pulse:  94 95 93  Resp:  (!) 23 (!) 25 (!) 22  Temp: 98 F (36.7 C)     TempSrc:      SpO2:  97% 98% 99%    Intake/Output Summary (Last 24 hours) at 07/15/2022 1213 Last data filed at 07/15/2022 1147 Gross per 24 hour  Intake 760 ml  Output --  Net 760 ml   There were no vitals filed for this visit.  Examination:  General exam: Appears calm and comfortable  Respiratory system:  Clear to auscultation. Respiratory effort normal.  Nasal cannula oxygen Cardiovascular system: S1 & S2 heard, RRR.  Gastrointestinal system: Abdomen is soft Central nervous system: Alert and awake Extremities: No edema Skin: No significant lesions noted Psychiatry: Flat affect.    Data Reviewed: I have personally reviewed following labs and imaging  studies  CBC: Recent Labs  Lab 07/11/22 1521 07/12/22 0245 07/12/22 0931 07/13/22 0511 07/14/22 0527 07/15/22 0417  WBC 8.1 9.5  --  8.0 7.8 8.4  NEUTROABS 5.3  --   --   --   --   --   HGB 6.1* 8.7* 8.5* 8.7* 9.0* 8.6*  HCT 19.2* 25.8* 25.6* 26.4* 27.1* 26.8*  MCV 91.0 88.1  --  88.9 90.3 90.5  PLT 270 234  --  232 246 238   Basic Metabolic Panel: Recent Labs  Lab 07/11/22 1521 07/12/22 0245 07/13/22 0511 07/14/22 0527 07/15/22 0417  NA 132* 132* 131* 131* 131*  K 4.1 3.9 3.6 3.8 4.2  CL 104 106 104 104 104  CO2 19* 19* 21* 20* 20*  GLUCOSE 191* 175* 161* 91 196*  BUN 22 21 20 16 19   CREATININE 1.40* 1.35* 1.30* 1.27* 1.41*  CALCIUM 9.3 8.6* 8.7* 8.5* 8.8*  MG  --   --  1.8 1.8 1.9   GFR: CrCl cannot be calculated (Unknown ideal weight.). Liver Function Tests: Recent Labs  Lab 07/11/22 1521 07/14/22 1148  AST 34 40  ALT 13 12  ALKPHOS 56 50  BILITOT 2.7* 2.7*  PROT 6.7 5.7*  ALBUMIN 2.9* 2.2*   Recent Labs  Lab 07/11/22 1521  LIPASE 35   No results for input(s): "AMMONIA" in the last 168 hours. Coagulation Profile: Recent Labs  Lab 07/11/22 1521 07/12/22 0819 07/14/22 1148  INR 1.4* 1.4* 1.5*   Cardiac Enzymes: No results for input(s): "CKTOTAL", "CKMB", "CKMBINDEX", "TROPONINI" in the last 168 hours. BNP (last 3 results) No results for input(s): "PROBNP" in the last 8760 hours. HbA1C: No results for input(s): "HGBA1C" in the last 72 hours. CBG: Recent Labs  Lab 07/14/22 1736 07/14/22 2112 07/15/22 0735 07/15/22 0945 07/15/22 1203  GLUCAP 219* 258* 140* 109* 115*   Lipid Profile: Recent Labs    07/15/22 0417  CHOL 92  HDL 11*  LDLCALC 57  TRIG 161  CHOLHDL 8.4   Thyroid Function Tests: No results for input(s): "TSH", "T4TOTAL", "FREET4", "T3FREE", "THYROIDAB" in the last 72 hours. Anemia Panel: No results for input(s): "VITAMINB12", "FOLATE", "FERRITIN", "TIBC", "IRON", "RETICCTPCT" in the last 72 hours. Sepsis Labs: No  results for input(s): "PROCALCITON", "LATICACIDVEN" in the last 168 hours.  No results found for this or any previous visit (from the past 240 hour(s)).       Radiology Studies: ECHOCARDIOGRAM LIMITED  Result Date: 07/14/2022    ECHOCARDIOGRAM LIMITED REPORT   Patient Name:   Jessica Torres Date of Exam: 07/14/2022 Medical Rec #:  [id number]      Height:       65.0 in Accession #:    [id number]     Weight:       141.5 lb Date of Birth:  1940-07-10      BSA:          1.708 m Patient Age:    81 years       BP:           107/49 mmHg Patient Gender: F              HR:  87 bpm. [facility] Procedure: Limited Echo Indications:    Elevated Troponin  History:        Patient has prior history of Echocardiogram examinations, most                 recent 07/12/2022. CAD, Signs/Symptoms:Murmur; Risk                 Factors:Hypertension, Diabetes and Dyslipidemia. Sleep Apnea.  Sonographer:    Celesta Gentile RCS Referring Phys: [id number] PAULA V ROSS IMPRESSIONS  1. Limited echo with Definity. LVEF is depressed with hypokinesis of the mid/distal lateral, mid/distal septal, distal inferior and apical walls. Overall LVEF approximately 40 to 45%. FINDINGS  Left Ventricle: Limited echo with Definity. LVEF is depressed with hypokinesis of the mid/distal lateral, mid/distal septal, distal inferior and apical walls. Overall LVEF approximately 40 to 45%. Definity contrast agent was given IV to delineate the left ventricular endocardial borders. Dietrich Pates MD Electronically signed by Dietrich Pates MD Signature Date/Time: 07/14/2022/9:09:17 PM    Final         Scheduled Meds:  [ZOX Hold] dapagliflozin propanediol  10 mg Oral Daily   [MAR Hold] ezetimibe  10 mg Oral Daily   [MAR Hold] insulin aspart  0-15 Units Subcutaneous TID WC   [MAR Hold] insulin aspart  0-5 Units Subcutaneous QHS   insulin aspart  5 Units Subcutaneous TID WC   [MAR Hold] insulin glargine-yfgn  52 Units Subcutaneous QHS   [MAR Hold]  levothyroxine  88 mcg Oral QAC breakfast   [MAR Hold] loratadine  10 mg Oral Daily   [MAR Hold] pantoprazole (PROTONIX) IV  40 mg Intravenous Q12H   [MAR Hold] polyethylene glycol  17 g Oral Daily     LOS: 4 days    Time spent: 35 minutes    Keaira Whitehurst Hoover Brunette, DO [facility]  If 7PM-7AM, please contact night-coverage [email or web address] 07/15/2022, 12:13 PM

## 2022-07-15 NOTE — Op Note (Signed)
[facility] Patient Name: Jessica Torres Procedure Date: 07/15/2022 11:22 AM MRN: [id number] Date of Birth: 1940/08/15 Attending MD: [name] , , [id number] CSN: [id number] Age: 82 Admit Type: Inpatient Procedure:                Upper GI endoscopy Indications:              Iron deficiency anemia, Melena Providers:                [name], [place] Scrape RN, RN,                            Judeth Cornfield. Jessee Avers, Technician Referring MD:              Medicines:                Monitored Anesthesia Care Complications:            No immediate complications. Estimated Blood Loss:     Estimated blood loss: none. Procedure:                Pre-Anesthesia Assessment:                           - Prior to the procedure, a History and Physical                            was performed, and patient medications, allergies                            and sensitivities were reviewed. The patient's                            tolerance of previous anesthesia was reviewed.                           - The risks and benefits of the procedure and the                            sedation options and risks were discussed with the                            patient. All questions were answered and informed                            consent was obtained.                           After obtaining informed consent, the endoscope was                            passed under direct vision. Throughout the                            procedure, the patient's blood pressure, pulse, and                            oxygen saturations  were monitored continuously. The                            [id number]-H190 ([id number]) scope was introduced through the                            mouth, and advanced to the second part of duodenum.                            The upper GI endoscopy was accomplished without                            difficulty. The patient tolerated the procedure                             well. Scope In: 11:35:14 AM Scope Out: 11:47:10 AM Total Procedure Duration: 0 hours 11 minutes 56 seconds  Findings:      Grade I varices were found in the lower third of the esophagus.      Mild portal hypertensive gastropathy was found in the entire examined       stomach.      A single 4 mm sessile polyp with no bleeding was found in the gastric       body. The polyp was removed with a hot snare. Resection and retrieval       were complete.      Mild gastric antral vascular ectasia without bleeding was present in the       gastric antrum. Coagulation for bleeding prevention using argon plasma       at 0.3 liters/minute and 20 watts was successful.      The examined duodenum was normal. Impression:               - Grade I esophageal varices.                           - Portal hypertensive gastropathy.                           - A single gastric polyp. Resected and retrieved.                           - Gastric antral vascular ectasia without bleeding.                            Treated with argon plasma coagulation (APC).                           - Normal examined duodenum. Moderate Sedation:      Per Anesthesia Care Recommendation:           - Return patient to hospital ward for ongoing care.                           - Resume previous diet.                           - Await pathology results.                           -  Use Protonix (pantoprazole) 40 mg PO BID.                           - Restart Plavix tomorrow.                           - Daily H/H.                           - Repeat CBC in 2 weeks, if recurrent anemia may                            consider capsule endoscopy.                           - Consider repeat EGD in 4-6 weeks to assess GAVE.                            If not pursuing this, may repeat EGD in 2 years for                            esophageal varices surveillance. Procedure Code(s):        --- Professional ---                           [phone], 59,  Esophagogastroduodenoscopy, flexible,                            transoral; with control of bleeding, any method                           43251, Esophagogastroduodenoscopy, flexible,                            transoral; with removal of tumor(s), polyp(s), or                            other lesion(s) by snare technique Diagnosis Code(s):        --- Professional ---                           I85.00, Esophageal varices without bleeding                           K76.6, Portal hypertension                           K31.89, Other diseases of stomach and duodenum                           K31.7, Polyp of stomach and duodenum                           K31.819, Angiodysplasia of stomach and duodenum  without bleeding                           D50.9, Iron deficiency anemia, unspecified                           K92.1, Melena (includes Hematochezia) CPT copyright 2022 [employer]. All rights reserved. The codes documented in this report are preliminary and upon coder review may  be revised to meet current compliance requirements. [name], MD [name],  07/15/2022 11:56:50 AM This report has been signed electronically. Number of Addenda: 0

## 2022-07-16 DIAGNOSIS — R932 Abnormal findings on diagnostic imaging of liver and biliary tract: Secondary | ICD-10-CM | POA: Diagnosis not present

## 2022-07-16 DIAGNOSIS — D62 Acute posthemorrhagic anemia: Secondary | ICD-10-CM | POA: Diagnosis not present

## 2022-07-16 DIAGNOSIS — I5022 Chronic systolic (congestive) heart failure: Secondary | ICD-10-CM

## 2022-07-16 LAB — BASIC METABOLIC PANEL
Anion gap: 6 (ref 5–15)
BUN: 17 mg/dL (ref 8–23)
CO2: 21 mmol/L — ABNORMAL LOW (ref 22–32)
Calcium: 8.6 mg/dL — ABNORMAL LOW (ref 8.9–10.3)
Chloride: 106 mmol/L (ref 98–111)
Creatinine, Ser: 1.4 mg/dL — ABNORMAL HIGH (ref 0.44–1.00)
GFR, Estimated: 38 mL/min — ABNORMAL LOW (ref >60)
Glucose, Bld: 112 mg/dL — ABNORMAL HIGH (ref 70–99)
Potassium: 4 mmol/L (ref 3.5–5.1)
Sodium: 133 mmol/L — ABNORMAL LOW (ref 135–145)

## 2022-07-16 LAB — GLUCOSE, CAPILLARY
Glucose-Capillary: 111 mg/dL — ABNORMAL HIGH (ref 70–99)
Glucose-Capillary: 227 mg/dL — ABNORMAL HIGH (ref 70–99)
Glucose-Capillary: 177 mg/dL — ABNORMAL HIGH (ref 70–99)
Glucose-Capillary: 140 mg/dL — ABNORMAL HIGH (ref 70–99)

## 2022-07-16 LAB — HEPATIC FUNCTION PANEL
ALT: 15 U/L (ref 0–44)
AST: 42 U/L — ABNORMAL HIGH (ref 15–41)
Albumin: 2.5 g/dL — ABNORMAL LOW (ref 3.5–5.0)
Alkaline Phosphatase: 62 U/L (ref 38–126)
Bilirubin, Direct: 1 mg/dL — ABNORMAL HIGH (ref 0.0–0.2)
Indirect Bilirubin: 1.3 mg/dL — ABNORMAL HIGH (ref 0.3–0.9)
Total Bilirubin: 2.3 mg/dL — ABNORMAL HIGH (ref 0.3–1.2)
Total Protein: 6.5 g/dL (ref 6.5–8.1)

## 2022-07-16 LAB — CBC
HCT: 27.3 % — ABNORMAL LOW (ref 36.0–46.0)
Hemoglobin: 8.9 g/dL — ABNORMAL LOW (ref 12.0–15.0)
MCH: 29.4 pg (ref 26.0–34.0)
MCHC: 32.6 g/dL (ref 30.0–36.0)
MCV: 90.1 fL (ref 80.0–100.0)
Platelets: 243 10*3/uL (ref 150–400)
RBC: 3.03 MIL/uL — ABNORMAL LOW (ref 3.87–5.11)
RDW: 16.2 % — ABNORMAL HIGH (ref 11.5–15.5)
WBC: 8 10*3/uL (ref 4.0–10.5)
nRBC: 0 % (ref 0.0–0.2)

## 2022-07-16 LAB — PROTIME-INR
INR: 1.2 (ref 0.8–1.2)
Prothrombin Time: 15.5 seconds — ABNORMAL HIGH (ref 11.4–15.2)

## 2022-07-16 LAB — MAGNESIUM: Magnesium: 2 mg/dL (ref 1.7–2.4)

## 2022-07-16 LAB — SURGICAL PATHOLOGY

## 2022-07-16 MED ORDER — ASPIRIN 81 MG PO CHEW
81.0000 mg | CHEWABLE_TABLET | Freq: Every day | ORAL | Status: DC
  Administered 2022-07-16 – 2022-07-17 (×2): 81 mg via ORAL
  Filled 2022-07-16 (×2): qty 1

## 2022-07-16 NOTE — Progress Notes (Signed)
[facility] PROGRESS NOTE  DENILLE KATZENSTEIN (DOB: [birth year]) ONG:[id number] PCP: Rebekah Chesterfield, NP  Brief Narrative: Jessica Torres is a 82 y.o. female with medical history significant of peripheral artery disease on Plavix, GAVE, GERD, history of stroke, diabetes, hypertension, hypothyroidism.  Patient was sent to the emergency department by her PCP after blood work yesterday showed acute anemia with a reported hemoglobin of 4 point something.  She was noted to have hemoglobin of 6.1 and required 2 unit PRBC transfusion with improvement to 8.7 with stability noted.  She has significant troponin elevation likely in the setting of repeat type II NSTEMI in the setting of demand ischemia.  2D echocardiogram 5/5 with wall motion abnormalities noted.  Seen by cardiology 5/6 and GI performed EGD 5/7 with findings of grade 1 varices and GAVE status post APC.  Patient also noted to have portal hypertension.   Plan is to restart antiplatelet with aspirin 81mg  in lieu of plavix given her ongoing risk of bleeding and monitor for rebleeding.   Subjective: No further bleeding noted. Eating ok though does have abdominal discomfort at times with that radiating between the breasts unchanged from prior. Was terrible last night minimally improve with tylenol but ultimately subsided.   Objective: BP (!) 98/39 (BP Location: Left Arm)   Pulse 80   Temp 98.5 F (36.9 C) (Oral)   Resp 18   SpO2 92%   Gen: Elderly pleasant female in no distress Pulm: Clear, nonlabored  CV: RRR, no MRG GI: Soft, +BS, ND, with TTP diffusely without rebound or guarding.  Neuro: Alert and oriented. No new focal deficits. Ext: Warm, no deformities. Skin: No rashes, lesions or ulcers on visualized skin   Assessment & Plan: Acute blood loss anemia secondary to GAVE and GERD: s/p 2u PRBCs, hgb stable today.  - Restart antiplatelet with aspirin 81mg  today, monitor for bleeding, tolerance, and H/H in AM. Transfusion threshold  7g/dl.  - Continue protonix 40mg  BID - Repeat CBC in 2 weeks, consider capsule endoscopy at that time per GI.  - Repeat EGD in 4-6 weeks per GI   IDT2DM:  - Continue medications as ordered.   NSTEMI: Troponin has peaked, no active angina.  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  - Limited GDMT by soft BP. Continue SGLT2i   PAD:  - Continue antiplatelet, zetia (LDL 57 and statin intolerant)  Stage IIIa CKD: At baseline - Monitor intermittently  Hypothyroidism: Last TSH 3.412.  - Continue synthroid  Abnormal CT appearance of liver with esophageal varices on EGD suspicious for cirrhosis:  - Defer further work up to GI.   Tyrone Nine, MD [facility] [email or web address] 07/16/2022, 6:03 PM

## 2022-07-16 NOTE — Progress Notes (Signed)
Progress Note  Patient Name: Jessica Torres Date of Encounter: 07/16/2022  Primary Cardiologist: Nanetta Batty, MD  Interval Summary   Reports epigastric/stomach discomfort overnight.  Eating breakfast this morning.  No shortness of breath at rest.  Vital Signs    Vitals:   07/15/22 1230 07/15/22 1302 07/15/22 2108 07/16/22 0530  BP: (!) 129/52 (!) 134/54 (!) 126/47 (!) 104/36  Pulse: 88 88 95 87  Resp: 18 16 20    Temp:  98.2 F (36.8 C) 98.8 F (37.1 C) 98.8 F (37.1 C)  TempSrc:  Oral Oral Oral  SpO2: 100% 95% 96% 93%    Intake/Output Summary (Last 24 hours) at 07/16/2022 0932 Last data filed at 07/15/2022 1700 Gross per 24 hour  Intake 880 ml  Output --  Net 880 ml   There were no vitals filed for this visit.  Physical Exam   GEN: No acute distress.   Neck: No JVD. Cardiac: RRR, 2/6 systolic murmur, rub.  Respiratory: Nonlabored. Clear to auscultation bilaterally. GI: Soft, nontender, bowel sounds present. MS: No edema.  ECG/Telemetry    Telemetry reviewed showing sinus rhythm.  Labs    Chemistry Recent Labs  Lab 07/11/22 1521 07/12/22 0245 07/14/22 0527 07/14/22 1148 07/15/22 0417 07/16/22 0417  NA 132*   < > 131*  --  131* 133*  K 4.1   < > 3.8  --  4.2 4.0  CL 104   < > 104  --  104 106  CO2 19*   < > 20*  --  20* 21*  GLUCOSE 191*   < > 91  --  196* 112*  BUN 22   < > 16  --  19 17  CREATININE 1.40*   < > 1.27*  --  1.41* 1.40*  CALCIUM 9.3   < > 8.5*  --  8.8* 8.6*  PROT 6.7  --   --  5.7*  --   --   ALBUMIN 2.9*  --   --  2.2*  --   --   AST 34  --   --  40  --   --   ALT 13  --   --  12  --   --   ALKPHOS 56  --   --  50  --   --   BILITOT 2.7*  --   --  2.7*  --   --   GFRNONAA 38*   < > 43*  --  38* 38*  ANIONGAP 9   < > 7  --  7 6   < > = values in this interval not displayed.    Hematology Recent Labs  Lab 07/14/22 0527 07/15/22 0417 07/16/22 0417  WBC 7.8 8.4 8.0  RBC 3.00* 2.96* 3.03*  HGB 9.0* 8.6* 8.9*  HCT 27.1*  26.8* 27.3*  MCV 90.3 90.5 90.1  MCH 30.0 29.1 29.4  MCHC 33.2 32.1 32.6  RDW 15.4 15.8* 16.2*  PLT 246 238 243   Cardiac Enzymes Recent Labs  Lab 07/12/22 2245 07/13/22 0511 07/13/22 1103 07/13/22 1700 07/13/22 2256  TROPONINIHS 647* 677* 554* 615* 619*   Lipid Panel     Component Value Date/Time   CHOL 92 07/15/2022 0417   CHOL 126 08/22/2021 1131   TRIG 122 07/15/2022 0417   HDL 11 (L) 07/15/2022 0417   HDL 36 (L) 08/22/2021 1131   CHOLHDL 8.4 07/15/2022 0417   VLDL 24 07/15/2022 0417   LDLCALC 57 07/15/2022 0417  LDLCALC 63 08/22/2021 1131   LABVLDL 27 08/22/2021 1131    Cardiac Studies   Limited echocardiogram 2022/08/11:  1. Limited echo with Definity. LVEF is depressed with hypokinesis of the  mid/distal lateral, mid/distal septal, distal inferior and apical walls.  Overall LVEF approximately 40 to 45%.   Assessment & Plan   1.  NSTEMI, peak high-sensitivity troponin I 992.  ECG with ST segment changes consistent with ischemia.  Currently without active angina.  Medical therapy significantly limited in the setting of other active comorbidities.  She is on Zetia.  2.  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  LVEF 40 to 45% with periapical wall motion abnormalities, cannot exclude stress-induced cardiomyopathy pattern although ischemic etiology very likely.  She is on [place].  3.  Acute GI bleed with severe anemia, hemoglobin down to 6.1.  Status post PRBC transfusion with hemoglobin up to 8.9.  Patient seen by gastroenterology and underwent EGD on May 7.  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  Examined portion of the duodenum was normal.  Started on Protonix.  EGD note mentions resumption of antiplatelet therapy as of today.  Ultimately may be may need further testing including  capsule endoscopy depending on CBC trend.  4.  Mixed hyperlipoidemia with statin intolerance.  Currently tolerating Zetia with LDL 57.  5.  PAD with prior revascularization procedures, previously on Plavix.  Chart reviewed and situation discussed with the patient.  Continue Zetia and [place].  Plan to start aspirin 81 mg daily, hold off on resuming Plavix until hemoglobin reassessed given high risk of rebleeding.  Current hemodynamics are such that additional GDMT is limited.  May ultimately be able to add low-dose bisoprolol plus minus Aldactone or very low dose ARB.  For questions or updates, please contact [facility] Please consult [email or web address] for contact info under   Signed, Nona Dell, MD  07/16/2022, 9:32 AM

## 2022-07-16 NOTE — Progress Notes (Signed)
Subjective: Patient attempting to eat breakfast during assessment. She reports continued pain in her abdomen, noting pain in lower abdomen as well as epigastric area. Last BM was yesterday morning without presence of blood or melena. She notes she asked for miralax this morning to help her go to the restroom. She has had some continued nausea    Objective: Vital signs in last 24 hours: Temp:  [98 F (36.7 C)-98.8 F (37.1 C)] 98.8 F (37.1 C) (05/08 0530) Pulse Rate:  [87-95] 87 (05/08 0530) Resp:  [16-25] 20 (05/07 2108) BP: (69-134)/(36-57) 104/36 (05/08 0530) SpO2:  [93 %-100 %] 93 % (05/08 0530) Last BM Date : 07/15/22 General:   Alert and oriented, pleasant Head:  Normocephalic and atraumatic. Eyes:  No icterus, sclera clear. Conjuctiva pink.  Mouth:  Without lesions, mucosa pink and moist.   Heart:  S1, S2 present, no murmurs noted.  Lungs: Clear to auscultation bilaterally, without wheezing, rales, or rhonchi.  Abdomen:  Bowel sounds present, soft, non-distended. TTP of epigastric region. No HSM or hernias noted. No rebound or guarding. No masses appreciated  Msk:  Symmetrical without gross deformities. Normal posture. Pulses:  Normal pulses noted. Extremities:  Without clubbing or edema. Neurologic:  Alert and  oriented x4;  grossly normal neurologically. Skin:  Warm and dry, intact without significant lesions.  Psych:  Alert and cooperative. Normal mood and affect.  Intake/Output from previous day: 05/07 0701 - 05/08 0700 In: 880 [P.O.:480; I.V.:400] Out: -  Intake/Output this shift: No intake/output data recorded.  Lab Results: Recent Labs    07/14/22 0527 07/15/22 0417 07/16/22 0417  WBC 7.8 8.4 8.0  HGB 9.0* 8.6* 8.9*  HCT 27.1* 26.8* 27.3*  PLT 246 238 243   BMET Recent Labs    07/14/22 0527 07/15/22 0417 07/16/22 0417  NA 131* 131* 133*  K 3.8 4.2 4.0  CL 104 104 106  CO2 20* 20* 21*  GLUCOSE 91 196* 112*  BUN 16 19 17   CREATININE 1.27* 1.41*  1.40*  CALCIUM 8.5* 8.8* 8.6*   LFT Recent Labs    07/14/22 1148  PROT 5.7*  ALBUMIN 2.2*  AST 40  ALT 12  ALKPHOS 50  BILITOT 2.7*  BILIDIR 1.3*  IBILI 1.4*   PT/INR Recent Labs    07/14/22 1148  LABPROT 17.5*  INR 1.5*    Studies/Results: ECHOCARDIOGRAM LIMITED  Result Date: 07/14/2022    ECHOCARDIOGRAM LIMITED REPORT   Patient Name:   Jessica Torres Date of Exam: 07/14/2022 Medical Rec #:  [id number]      Height:       65.0 in Accession #:    [id number]     Weight:       141.5 lb Date of Birth:  [birth year]      BSA:          1.708 m Patient Age:    81 years       BP:           107/49 mmHg Patient Gender: F              HR:           87 bpm. [facility] Procedure: Limited Echo Indications:    Elevated Troponin  History:        Patient has prior history of Echocardiogram examinations, most                 recent 07/12/2022. CAD, Signs/Symptoms:Murmur; Risk  Factors:Hypertension, Diabetes and Dyslipidemia. Sleep Apnea.  Sonographer:    Celesta Gentile RCS Referring Phys: [id number] PAULA V ROSS IMPRESSIONS  1. Limited echo with Definity. LVEF is depressed with hypokinesis of the mid/distal lateral, mid/distal septal, distal inferior and apical walls. Overall LVEF approximately 40 to 45%. FINDINGS  Left Ventricle: Limited echo with Definity. LVEF is depressed with hypokinesis of the mid/distal lateral, mid/distal septal, distal inferior and apical walls. Overall LVEF approximately 40 to 45%. Definity contrast agent was given IV to delineate the left ventricular endocardial borders. Dietrich Pates MD Electronically signed by Dietrich Pates MD Signature Date/Time: 07/14/2022/9:09:17 PM    Final     Assessment: Jessica Torres is a 82 y.o. female with a history of HLD, HTN, hypothyroidism, GERD, arthritis, DM stroke, PAD on Plavix, uterine cancer and history of GAVE, presenting with melena and worsening acute on chronic anemia.   Acute on chronic anemia: hgb 6.1 on admission, 2  units PRBCs received with hgb 8.9 today. Colonoscopy 11/2020 with adenomas, EGD delayed until yesterday due to possible NSTEMI and need for cards clearance. EGD yesterday with grade 1 EVs, GAVE, portal hypertensive gastropathy and gastric polyps, biopsy pending. Recommend PPI BID, repeat EGD in 4-6 weeks to reassess GAVE. No BMs today, last yesterday without BRBPR or melena.   Abnormal CT of liver: nodular hepatic contour on CT 05/25/22, T bili 2.7 on admission, Acute hep panel negative, APF 3.2,  ASMA, IgG, ANA, AMA all WNL, A1A elevated at 217. High suspicion for cirrhosis given liver contour and presence of EVs on EGD yesterday. MELD 3.0 on Monday 23, will update INR and HFP to recalculate today. Will need further evaluation/management of liver disease as outpatient.   Plan: Restart plavix today Protonix 40mg  BID Trend h&h, transfuse as needed for hgb <7 Outpatient liver management/HCC screening via [place] Repeat CBC in 2 weeks, consider Givens capsule study if persistent anemia Repeat EGD 4-6 weeks to assess GAVE Anti emetics PRN  Miralax 17g PRN   LOS: 5 days    07/16/2022, 8:41 AM   Ipek Westra L. Jeanmarie Hubert, MSN, APRN, AGNP-C Adult-Gerontology Nurse Practitioner [facility]

## 2022-07-17 ENCOUNTER — Telehealth (HOSPITAL_COMMUNITY): Payer: Self-pay

## 2022-07-17 ENCOUNTER — Telehealth: Payer: Self-pay | Admitting: Gastroenterology

## 2022-07-17 ENCOUNTER — Other Ambulatory Visit (HOSPITAL_COMMUNITY): Payer: Self-pay

## 2022-07-17 DIAGNOSIS — D649 Anemia, unspecified: Secondary | ICD-10-CM

## 2022-07-17 DIAGNOSIS — K31819 Angiodysplasia of stomach and duodenum without bleeding: Secondary | ICD-10-CM | POA: Diagnosis not present

## 2022-07-17 DIAGNOSIS — K7460 Unspecified cirrhosis of liver: Secondary | ICD-10-CM | POA: Diagnosis not present

## 2022-07-17 DIAGNOSIS — D62 Acute posthemorrhagic anemia: Secondary | ICD-10-CM | POA: Diagnosis not present

## 2022-07-17 LAB — GLUCOSE, CAPILLARY
Glucose-Capillary: 94 mg/dL (ref 70–99)
Glucose-Capillary: 245 mg/dL — ABNORMAL HIGH (ref 70–99)

## 2022-07-17 LAB — CBC
HCT: 27.2 % — ABNORMAL LOW (ref 36.0–46.0)
Hemoglobin: 8.8 g/dL — ABNORMAL LOW (ref 12.0–15.0)
MCH: 29.5 pg (ref 26.0–34.0)
MCHC: 32.4 g/dL (ref 30.0–36.0)
MCV: 91.3 fL (ref 80.0–100.0)
Platelets: 230 10*3/uL (ref 150–400)
RBC: 2.98 MIL/uL — ABNORMAL LOW (ref 3.87–5.11)
RDW: 16.1 % — ABNORMAL HIGH (ref 11.5–15.5)
WBC: 8.3 10*3/uL (ref 4.0–10.5)
nRBC: 0 % (ref 0.0–0.2)

## 2022-07-17 MED ORDER — PANTOPRAZOLE SODIUM 40 MG PO TBEC: 40.0000 mg | DELAYED_RELEASE_TABLET | Freq: Two times a day (BID) | ORAL | 1 refills | Status: DC

## 2022-07-17 MED ORDER — DAPAGLIFLOZIN PROPANEDIOL 10 MG PO TABS: 10.0000 mg | ORAL_TABLET | Freq: Every day | ORAL | 1 refills | Status: DC

## 2022-07-17 MED ORDER — ASPIRIN 81 MG PO CHEW: 81.0000 mg | CHEWABLE_TABLET | Freq: Every day | ORAL | 0 refills | Status: DC

## 2022-07-17 NOTE — TOC Benefit Eligibility Note (Signed)
Patient Product/process development scientist completed.    The patient is currently admitted and upon discharge could be taking [place].  The current 30 day co-pay is $0.00.   The patient is currently admitted and upon discharge could be taking Jardiance.  The current 30 day co-pay is $0.00.   The patient is insured through [employer]   This test claim was processed through [facility]- copay amounts may vary at other pharmacies due to pharmacy/plan contracts, or as the patient moves through the different stages of their insurance plan.

## 2022-07-17 NOTE — Progress Notes (Signed)
Due to weakness and the beneficiary being confined to a single room patient will need bedside commode.

## 2022-07-17 NOTE — Progress Notes (Signed)
Progress Note  Patient Name: Jessica Torres Date of Encounter: 07/17/2022  Primary Cardiologist: Dietrich Pates, MD  Interval Summary   States that she had a better night, no abdominal discomfort, tolerating p.o.'s.  No chest tightness or shortness of breath.  Vital Signs    Vitals:   07/16/22 0530 07/16/22 1204 07/16/22 2112 07/17/22 0416  BP: (!) 104/36 (!) 98/39 (!) 134/55 (!) 108/50  Pulse: 87 80 97 96  Resp:  18 20 18   Temp: 98.8 F (37.1 C) 98.5 F (36.9 C) 99.1 F (37.3 C) 99.1 F (37.3 C)  TempSrc: Oral Oral Oral Oral  SpO2: 93% 92% 96% 93%    Intake/Output Summary (Last 24 hours) at 07/17/2022 0932 Last data filed at 07/16/2022 1300 Gross per 24 hour  Intake 480 ml  Output --  Net 480 ml   There were no vitals filed for this visit.  Physical Exam   GEN: No acute distress.   Neck: No JVD. Cardiac: RRR, 2/6 systolic murmur, no rub.  Respiratory: Nonlabored. Clear to auscultation bilaterally. GI: Soft, bowel sounds present. MS: No edema.  ECG/Telemetry    Telemetry reviewed showing sinus rhythm.  Labs    Chemistry Recent Labs  Lab 07/11/22 1521 07/12/22 0245 07/14/22 0527 07/14/22 1148 07/15/22 0417 07/16/22 0417 07/16/22 0940  NA 132*   < > 131*  --  131* 133*  --   K 4.1   < > 3.8  --  4.2 4.0  --   CL 104   < > 104  --  104 106  --   CO2 19*   < > 20*  --  20* 21*  --   GLUCOSE 191*   < > 91  --  196* 112*  --   BUN 22   < > 16  --  19 17  --   CREATININE 1.40*   < > 1.27*  --  1.41* 1.40*  --   CALCIUM 9.3   < > 8.5*  --  8.8* 8.6*  --   PROT 6.7  --   --  5.7*  --   --  6.5  ALBUMIN 2.9*  --   --  2.2*  --   --  2.5*  AST 34  --   --  40  --   --  42*  ALT 13  --   --  12  --   --  15  ALKPHOS 56  --   --  50  --   --  62  BILITOT 2.7*  --   --  2.7*  --   --  2.3*  GFRNONAA 38*   < > 43*  --  38* 38*  --   ANIONGAP 9   < > 7  --  7 6  --    < > = values in this interval not displayed.    Hematology Recent Labs  Lab 07/15/22 0417  07/16/22 0417 07/17/22 0446  WBC 8.4 8.0 8.3  RBC 2.96* 3.03* 2.98*  HGB 8.6* 8.9* 8.8*  HCT 26.8* 27.3* 27.2*  MCV 90.5 90.1 91.3  MCH 29.1 29.4 29.5  MCHC 32.1 32.6 32.4  RDW 15.8* 16.2* 16.1*  PLT 238 243 230   Cardiac Enzymes Recent Labs  Lab 07/12/22 2245 07/13/22 0511 07/13/22 1103 07/13/22 1700 07/13/22 2256  TROPONINIHS 647* 677* 554* 615* 619*   Lipid Panel     Component Value Date/Time   CHOL 92 07/15/2022 0417  CHOL 126 08/22/2021 1131   TRIG 122 07/15/2022 0417   HDL 11 (L) 07/15/2022 0417   HDL 36 (L) 08/22/2021 1131   CHOLHDL 8.4 07/15/2022 0417   VLDL 24 07/15/2022 0417   LDLCALC 57 07/15/2022 0417   LDLCALC 63 08/22/2021 1131   LABVLDL 27 08/22/2021 1131    Cardiac Studies   Limited echocardiogram 08-13-22:  1. Limited echo with Definity. LVEF is depressed with hypokinesis of the  mid/distal lateral, mid/distal septal, distal inferior and apical walls.  Overall LVEF approximately 40 to 45%.   Assessment & Plan   1.  NSTEMI, peak high-sensitivity troponin I 992.  ECG with ST segment changes consistent with ischemia.  Currently without active angina.  Medical therapy significantly limited in the setting of other active comorbidities.  She is on Zetia.  Started back on aspirin 81 mg daily and tolerating so far.  2.  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  LVEF 40 to 45% with periapical wall motion abnormalities, cannot exclude stress-induced cardiomyopathy pattern although ischemic etiology very likely.  She is on [place].  GDMT significantly limited at this point otherwise.  3.  Acute GI bleed with severe anemia, hemoglobin down to 6.1.  Status post PRBC transfusion.  Patient seen by gastroenterology and underwent EGD on May 7.  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   Examined portion of the duodenum was normal.  Started on Protonix.  EGD note mentions resumption of antiplatelet therapy as of today.  Ultimately may be may need further testing including capsule endoscopy depending on CBC trend.  Hemoglobin this morning is relatively stable at 8.8.  4.  Mixed hyperlipoidemia with statin intolerance.  Currently tolerating Zetia with LDL 57.  5.  PAD with prior revascularization procedures, previously on Plavix.  Chart reviewed, patient remains clinically stable from a cardiac perspective.  GDMT is limited by blood pressure at this point.  Aspirin 81 mg daily was resumed, would not start back on Plavix at this time.  Continue Zetia and [place].  Would hold off on beta-blocker, ARB/ARNI, and MRA.  Stable for discharge from a cardiac perspective, we will set up outpatient follow-up in the cardiology clinic in [place].  She was seen by Dr. Tenny Craw in consultation, had seen Dr. Allyson Sabal years ago for management of PAD.  For questions or updates, please contact [facility] Please consult [email or web address] for contact info under   Signed, Nona Dell, MD  07/17/2022, 9:32 AM

## 2022-07-17 NOTE — Progress Notes (Signed)
Pt discharged home with son. AVS reviewed and pt acknowledged understanding. All belongings, including DME sent with patient.

## 2022-07-17 NOTE — Care Management Important Message (Signed)
Important Message  Patient Details  Name: Jessica Torres MRN: [id number] Date of Birth: [birth year]   Medicare Important Message Given:  Yes     Corey Harold 07/17/2022, 10:47 AM

## 2022-07-17 NOTE — TOC Transition Note (Signed)
Transition of Care [facility]) - CM/SW Discharge Note   Patient Details  Name: Jessica Torres MRN: [id number] Date of Birth: 11-23-1940  [facility] CM/SW Contact:  Villa Herb, LCSWA Phone Number: 07/17/2022, 1:16 PM   Clinical Narrative:    CSW updated that pt is requesting bedside commode and walker prior to D/C. CSW spoke to [place] with [employer] who will have DME delivered to hospital room for pt. MD placed needed DME orders. TOC signing off.   Final next level of care: Home/Self Care Barriers to Discharge: Barriers Resolved   Patient Goals and CMS Choice      Discharge Placement                         Discharge Plan and Services Additional resources added to the After Visit Summary for                  DME Arranged: Walker rolling, Bedside commode   Date DME Agency Contacted: 07/17/22   Representative spoke with at DME Agency: Barbara Cower            Social Determinants of Health (SDOH) Interventions SDOH Screenings   Food Insecurity: No Food Insecurity (04/27/2022)  Housing: Low Risk  (04/27/2022)  Transportation Needs: No Transportation Needs (04/27/2022)  Utilities: Not At Risk (04/27/2022)  Alcohol Screen: Low Risk  (09/25/2020)  Depression (PHQ2-9): Medium Risk (02/21/2022)  Financial Resource Strain: Low Risk  (09/25/2020)  Physical Activity: Inactive (09/25/2020)  Social Connections: Moderately Integrated (09/25/2020)  Stress: Stress Concern Present (09/25/2020)  Tobacco Use: Low Risk  (07/15/2022)     Readmission Risk Interventions     No data to display

## 2022-07-17 NOTE — Telephone Encounter (Signed)
Please arrange for hospital follow up with Dr. Marletta Lor or APP in 2-3 weeks.

## 2022-07-17 NOTE — Telephone Encounter (Signed)
Pharmacy Patient Advocate Encounter  Insurance verification completed.    The patient is insured through [facility]   The patient is currently admitted and ran test claims for the following: Farxiga, Jardiance.  Copays and coinsurance results were relayed to Inpatient clinical team.

## 2022-07-17 NOTE — Progress Notes (Signed)
Gastroenterology Progress Note   Referring Provider: No ref. provider found Primary Care Physician:  Rebekah Chesterfield, NP Primary Gastroenterologist:  Hennie Duos. Marletta Lor, DO   Patient ID: Jessica Torres; [id number]; Aug 28, 1940   Subjective:    Feels good this morning. Eating breakfast. Denies abdominal pain.   Objective:   Vital signs in last 24 hours: Temp:  [98.5 F (36.9 C)-99.1 F (37.3 C)] 99.1 F (37.3 C) (05/09 0416) Pulse Rate:  [80-97] 96 (05/09 0416) Resp:  [18-20] 18 (05/09 0416) BP: (98-134)/(39-55) 108/50 (05/09 0416) SpO2:  [92 %-96 %] 93 % (05/09 0416) Last BM Date : 07/16/22 General:   Alert,  Well-developed, well-nourished, pleasant and cooperative in NAD Head:  Normocephalic and atraumatic. Eyes:  Sclera clear, no icterus.   Abdomen:  Soft, nontender and nondistended.   Extremities:  Without clubbing, deformity or edema. Neurologic:  Alert and  oriented x4;  grossly normal neurologically. Psych:  Alert and cooperative. Normal mood and affect.  Intake/Output from previous day: 05/08 0701 - 05/09 0700 In: 480 [P.O.:480] Out: -  Intake/Output this shift: No intake/output data recorded.  Lab Results: CBC Recent Labs    07/15/22 0417 07/16/22 0417 07/17/22 0446  WBC 8.4 8.0 8.3  HGB 8.6* 8.9* 8.8*  HCT 26.8* 27.3* 27.2*  MCV 90.5 90.1 91.3  PLT 238 243 230   BMET Recent Labs    07/15/22 0417 07/16/22 0417  NA 131* 133*  K 4.2 4.0  CL 104 106  CO2 20* 21*  GLUCOSE 196* 112*  BUN 19 17  CREATININE 1.41* 1.40*  CALCIUM 8.8* 8.6*   LFTs Recent Labs    07/14/22 1148 07/16/22 0940  BILITOT 2.7* 2.3*  BILIDIR 1.3* 1.0*  IBILI 1.4* 1.3*  ALKPHOS 50 62  AST 40 42*  ALT 12 15  PROT 5.7* 6.5  ALBUMIN 2.2* 2.5*   No results for input(s): "LIPASE" in the last 72 hours. PT/INR Recent Labs    07/14/22 1148 07/16/22 0940  LABPROT 17.5* 15.5*  INR 1.5* 1.2         Imaging Studies: ECHOCARDIOGRAM LIMITED  Result Date:  07/14/2022    ECHOCARDIOGRAM LIMITED REPORT   Patient Name:   Jessica Torres Date of Exam: 07/14/2022 Medical Rec #:  [id number]      Height:       65.0 in Accession #:    [id number]     Weight:       141.5 lb Date of Birth:  09-04-1940      BSA:          1.708 m Patient Age:    82 years       BP:           107/49 mmHg Patient Gender: F              HR:           87 bpm. [facility] Procedure: Limited Echo Indications:    Elevated Troponin  History:        Patient has prior history of Echocardiogram examinations, most                 recent 07/12/2022. CAD, Signs/Symptoms:Murmur; Risk                 Factors:Hypertension, Diabetes and Dyslipidemia. Sleep Apnea.  Sonographer:    Celesta Gentile RCS Referring Phys: [id number] PAULA V ROSS IMPRESSIONS  1. Limited echo with Definity. LVEF is depressed with  hypokinesis of the mid/distal lateral, mid/distal septal, distal inferior and apical walls. Overall LVEF approximately 40 to 45%. FINDINGS  Left Ventricle: Limited echo with Definity. LVEF is depressed with hypokinesis of the mid/distal lateral, mid/distal septal, distal inferior and apical walls. Overall LVEF approximately 40 to 45%. Definity contrast agent was given IV to delineate the left ventricular endocardial borders. Dietrich Pates MD Electronically signed by Dietrich Pates MD Signature Date/Time: 07/14/2022/9:09:17 PM    Final    ECHOCARDIOGRAM COMPLETE  Result Date: 07/12/2022    ECHOCARDIOGRAM REPORT   Patient Name:   Jessica Torres Date of Exam: 07/12/2022 Medical Rec #:  [id number]      Height:       65.0 in Accession #:    [id number]     Weight:       141.5 lb Date of Birth:  Aug 08, 1940      BSA:          1.708 m Patient Age:    82 years       BP:           104/49 mmHg Patient Gender: F              HR:           97 bpm. [facility] Procedure: 2D Echo, Color Doppler and Cardiac Doppler Indications:    elevated troponin  History:        Patient has prior history of Echocardiogram examinations, most                  recent 04/28/2022. CAD, PAD and chronic kidney disease,                 Signs/Symptoms:Murmur; Risk Factors:Hypertension, Dyslipidemia,                 Diabetes and Sleep Apnea.  Sonographer:    Delcie Roch RDCS Referring Phys: [id number] PRAT[facility] IMPRESSIONS  1. Left ventricular ejection fraction, by estimation, is 50 to 55%. The left ventricle has low normal function. The left ventricle demonstrates regional wall motion abnormalities (see scoring diagram/findings for description). Left ventricular diastolic  parameters are indeterminate. The average left ventricular global longitudinal strain is -7.1 %. The global longitudinal strain is abnormal.  2. Right ventricular systolic function is normal. The right ventricular size is normal. There is mildly elevated pulmonary artery systolic pressure. The estimated right ventricular systolic pressure is 44.2 mmHg.  3. The mitral valve is normal in structure. Mild mitral valve regurgitation. No evidence of mitral stenosis.  4. The aortic valve is normal in structure. Aortic valve regurgitation is not visualized. No aortic stenosis is present.  5. The inferior vena cava is normal in size with greater than 50% respiratory variability, suggesting right atrial pressure of 3 mmHg. Comparison(s): Prior images reviewed side by side. The left ventricular function is worsened. FINDINGS  Left Ventricle: Left ventricular ejection fraction, by estimation, is 50 to 55%. The left ventricle has low normal function. The left ventricle demonstrates regional wall motion abnormalities. The average left ventricular global longitudinal strain is -7.1 %. The global longitudinal strain is abnormal. The left ventricular internal cavity size was normal in size. There is no left ventricular hypertrophy. Left ventricular diastolic parameters are indeterminate.  LV Wall Scoring: The apical septal segment is akinetic. Right Ventricle: The right ventricular size is normal. No  increase in right ventricular wall thickness. Right ventricular systolic function is normal. There is mildly elevated pulmonary  artery systolic pressure. The tricuspid regurgitant velocity is 3.21  m/s, and with an assumed right atrial pressure of 3 mmHg, the estimated right ventricular systolic pressure is 44.2 mmHg. Left Atrium: Left atrial size was normal in size. Right Atrium: Right atrial size was normal in size. Pericardium: There is no evidence of pericardial effusion. Mitral Valve: The mitral valve is normal in structure. Mild mitral valve regurgitation. No evidence of mitral valve stenosis. Tricuspid Valve: The tricuspid valve is normal in structure. Tricuspid valve regurgitation is mild . No evidence of tricuspid stenosis. Aortic Valve: The aortic valve is normal in structure. Aortic valve regurgitation is not visualized. No aortic stenosis is present. Pulmonic Valve: The pulmonic valve was normal in structure. Pulmonic valve regurgitation is not visualized. No evidence of pulmonic stenosis. Aorta: The aortic root is normal in size and structure. Venous: The inferior vena cava is normal in size with greater than 50% respiratory variability, suggesting right atrial pressure of 3 mmHg. IAS/Shunts: No atrial level shunt detected by color flow Doppler.  LEFT VENTRICLE PLAX 2D LVIDd:         4.30 cm   Diastology LVIDs:         2.80 cm   LV e' medial:    3.70 cm/s LV PW:         0.80 cm   LV E/e' medial:  34.3 LV IVS:        0.90 cm   LV e' lateral:   9.90 cm/s LVOT diam:     1.60 cm   LV E/e' lateral: 12.8 LV SV:         40 LV SV Index:   23        2D Longitudinal Strain LVOT Area:     2.01 cm  2D Strain GLS Avg:     -7.1 %  RIGHT VENTRICLE             IVC RV Basal diam:  2.30 cm     IVC diam: 2.00 cm RV S prime:     11.40 cm/s TAPSE (M-mode): 1.8 cm LEFT ATRIUM             Index        RIGHT ATRIUM          Index LA diam:        3.20 cm 1.87 cm/m   RA Area:     9.39 cm LA Vol (A2C):   45.8 ml 26.82 ml/m   RA Volume:   20.90 ml 12.24 ml/m LA Vol (A4C):   25.5 ml 14.93 ml/m LA Biplane Vol: 34.1 ml 19.97 ml/m  AORTIC VALVE LVOT Vmax:   105.00 cm/s LVOT Vmean:  72.800 cm/s LVOT VTI:    0.199 m  AORTA Ao Root diam: 2.40 cm Ao Asc diam:  2.50 cm MITRAL VALVE                TRICUSPID VALVE MV Area (PHT): 5.02 cm     TR Peak grad:   41.2 mmHg MV Decel Time: 151 msec     TR Vmax:        321.00 cm/s MR Peak grad: 98.8 mmHg MR Mean grad: 65.0 mmHg     SHUNTS MR Vmax:      497.00 cm/s   Systemic VTI:  0.20 m MR Vmean:     385.0 cm/s    Systemic Diam: 1.60 cm MV E velocity: 127.00 cm/s MV A velocity: 103.00 cm/s MV E/A ratio:  1.23 Donato Schultz MD Electronically signed by Donato Schultz MD Signature Date/Time: 07/12/2022/3:50:42 PM    Final   [2 weeks]  Assessment:   82 y/o female female with a history of HLD, HTN, hypothyroidism, GERD, arthritis, DM stroke, PAD on Plavix, uterine cancer and history of GAVE, presenting with melena and worsening acute on chronic anemia.    Acute on chronic anemia: hgb 6.1 on admission, 2 units PRBCs received. Hgb stable at 8.8 today. Colonoscopy 11/2020 with adenomas, EGD delayed until due to possible NSTEMI and need for cardiology clearance. EGD with grade 1 EVs, GAVE, portal hypertensive gastropathy and gastric polyps, biopsy c/w fundic gland polyps. Recommend PPI BID, repeat EGD in 4-6 weeks to reassess GAVE. BM today,no BRBPR or melena.    Abnormal CT of liver: nodular hepatic contour on CT 05/25/22, T bili 2.7 on admission, Acute hep panel negative, APF 3.2,  ASMA, IgG, ANA, AMA all WNL, A1A elevated at 217. High suspicion for cirrhosis given liver contour and presence of EVs on EGD yesterday. MELD 3.0 on Monday, 23. Yesterday was 20. Will need further evaluation/management of liver disease as outpatient.    Plan:   Continue pantoprazole 40 mg twice daily. If recurrent anemia, may consider capsule endoscopy.  Otherwise repeat EGD in 4 to 6 weeks to assess GAVE. At minimum, EGD in 2  years to reassess esophageal varices.  We will arrange for outpatient follow up in 2-3 weeks. GI to sign off.    LOS: 6 days   Leanna Battles. Dixon Boos [facility] [phone] 5/9/20248:12 AM

## 2022-07-17 NOTE — Discharge Summary (Addendum)
Physician Discharge Summary   [name] MRN: [id number] DOB: [birth year]  Admit date:     07/11/2022  Discharge date: 07/17/22  Discharge Physician: Tyrone Nine   PCP: Rebekah Chesterfield, NP   Recommendations at discharge:  Follow up with cardiology after discharge for ongoing management of HFmrEF. GDMT limited by hypotension, discharged on farxiga 10mg . Monitor for GI bleeding, discharged on aspirin 81mg  in lieu of restarting plavix.  Needs recheck CBC (Hgb stable at 8.8g/dl on day of discharge with no further evidence of bleeding) and BMP (with attention to K, Cr) at follow up. Follow up with PCP in 1-2 weeks.  Follow up to be arranged with GI, Dr. Marletta Lor, in 2-3 weeks. If anemia worsens, consider capsule endoscopy. Otherwise, EGD in 4-6 weeks to assess GAVE is recommended.  DME ordered for the patient's gait instability including a rolling walker to keep her safe.  Discharge Diagnoses: Principal Problem:   Acute blood loss anemia Active Problems:   Upper GI bleeding   NSTEMI (non-ST elevated myocardial infarction) (HCC)   Heart failure with preserved ejection fraction (HCC)   DM (diabetes mellitus) (HCC)   Hypertension associated with diabetes (HCC)   Chronic kidney disease, stage III (moderate) (HCC)   Peripheral arterial disease - Bilateral SFA    Hypothyroidism   Coronary artery disease due to lipid rich plaque   GAVE (gastric antral vascular ectasia)   Cirrhosis of liver without ascites (HCC)   Symptomatic anemia   Melena  Jessica Torres is a 81 y.o. female with medical history significant of peripheral artery disease on Plavix, GAVE, GERD, history of stroke, diabetes, hypertension, hypothyroidism.  Patient was sent to the emergency department by her PCP after blood work yesterday showed acute anemia with a reported hemoglobin of 4 point something.  She was noted to have hemoglobin of 6.1 and required 2 unit PRBC transfusion with improvement to 8.7 with stability  noted.  She has significant troponin elevation likely in the setting of repeat type II NSTEMI in the setting of demand ischemia.  2D echocardiogram 5/5 with wall motion abnormalities noted.  Seen by cardiology 5/6 and GI performed EGD 5/7 with findings of grade 1 varices and GAVE status post APC.  Patient also noted to have portal hypertension.   PPI BID was given, aspirin 81mg  was started in lieu to restarting plavix due to high rebleeding risk. No further clinical evidence of bleeding has been noticed, symptoms improved, and hemoglobin has remained stable. She has been cleared for discharge by the GI, cardiology, and medical teams on 5/9. Please see details below.     Hospital Course: No notes on file  Assessment and Plan: Acute blood loss anemia secondary to GAVE and GERD: s/p 2u PRBCs, hgb stable today.  - Restart antiplatelet with aspirin 81mg . Needs recheck CBC at follow up.  - Continue protonix 40mg  BID - Repeat CBC in 2 weeks, consider capsule endoscopy at that time per GI.  - Repeat EGD in 4-6 weeks per GI. 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  - ASA, zetia, no BP room for beta blocker at this time.   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  - Limited GDMT by soft BP. Continue SGLT2i with farxiga ($0 copay for this or jardiance per benefit check).  PAD:  - Continue antiplatelet, zetia (LDL 57 and statin intolerant)   Stage IIIa CKD: At baseline, CrCl is >5ml/min. - Monitor intermittently, recheck at follow up with ongoing use of SGLT2i.    Hypothyroidism: Last TSH 3.412.  - Continue synthroid   Abnormal CT appearance of liver with esophageal varices on EGD suspicious for cirrhosis:  - Defer further work up to GI.   Consultants: GI, cardiology Procedures performed: Echo 07/15/2022 Dr. Levon Hedger  EGD FINDINGS: - Grade I esophageal varices.  - Portal hypertensive gastropathy.  - A single gastric polyp.  Resected and retrieved.  - Gastric antral vascular ectasia without bleeding.  Treated with argon plasma coagulation (APC).  - Normal examined duodenum.    RECOMMENDATIONS - Return patient to hospital ward for ongoing care.  - Resume previous diet.  - Await pathology results.  - Use Protonix (pantoprazole) 40 mg PO BID.  - Restart Plavix tomorrow. - Daily H/H. - Repeat CBC in 2 weeks, if recurrent anemia may consider capsule endoscopy. - Consider repeat EGD in 4-6 weeks to assess GAVE. If not pursuing this, may repeat EGD in 2 years for esophageal varices surveillance.   Disposition: Home Diet recommendation:  Cardiac and Carb modified diet DISCHARGE MEDICATION: Allergies as of 07/17/2022       Reactions   Morphine And Related Swelling, Other (See Comments)   Facial swelling   Latex Rash   Statins Other (See Comments)   Myalgias        Medication List     STOP taking these medications    clopidogrel 75 MG tablet Commonly known as: PLAVIX       TAKE these medications    Accu-Chek Softclix Lancets lancets Test BS TID Dx E11.9   aspirin 81 MG chewable tablet Chew 1 tablet (81 mg total) by mouth daily. Start taking on: Jul 18, 2022   B-D SINGLE USE SWABS REGULAR Pads Test BS 4 times daily Dx E11.9   BD Pen Needle Nano 2nd Gen 32G X 4 MM Misc Generic drug: Insulin Pen Needle Use twice daily with insulin Dx E11.9   dapagliflozin propanediol 10 MG Tabs tablet Commonly known as: FARXIGA Take 1 tablet (10 mg total) by mouth daily. Start taking on: Jul 18, 2022   ezetimibe 10 MG tablet Commonly known as: ZETIA Take 1 tablet (10 mg total) by mouth daily.   fenofibrate 160 MG tablet TAKE 1 TABLET DAILY   ferrous sulfate 325 (65 FE) MG EC tablet Take 1 tablet (325 mg total) by mouth every Monday, Wednesday, and Friday.   FREESTYLE LITE test  strip Generic drug: glucose blood Test BS TID Dx E11.9   insulin lispro 100 UNIT/ML KwikPen Commonly known as: HUMALOG Inject 10-20 Units into the skin 3 (three) times daily. What changed:  how much to take when to take this   levothyroxine 88 MCG tablet Commonly known as: SYNTHROID TAKE 1 TABLET DAILY BEFORE BREAKFAST   ondansetron 4 MG tablet Commonly known as: ZOFRAN Take 4 mg by mouth every 8 (eight) hours as needed for vomiting or nausea. For 7 days   pantoprazole 40 MG tablet Commonly known as: PROTONIX Take 1 tablet (40 mg total) by mouth 2 (two) times daily. Follow up with gastroenterology for additional recommendations   Semaglutide(0.25 or 0.5MG /DOS) 2 MG/3ML Sopn Inject 0.25 mg into the skin once a week.   Toujeo Max SoloStar 300 UNIT/ML Solostar Pen Generic drug: insulin glargine (2 Unit Dial) INJECT 52 UNITS UNDER THE SKIN DAILY  What changed: See the new instructions.        Follow-up Information     Rebekah Chesterfield, NP Follow up.   Specialty: Internal Medicine Contact information: [address] [place] [address] [phone]         Lanelle Bal, DO Follow up.   Specialty: Gastroenterology Contact information: [address] [place] [address] [phone]         Pricilla Riffle, MD .   Specialty: Cardiology Contact information: [address] [place] [address] [phone]                Discharge Exam: BP (!) 108/50 (BP Location: Left Arm)   Pulse 96   Temp 99.1 F (37.3 C) (Oral)   Resp 18   SpO2 93%   Well-appearing elderly female in no distress Clear, nonlabored RRR, no MRG or pitting edema Soft, NT, ND, +BS  Condition at discharge: stable  The results of significant diagnostics from this hospitalization (including imaging, microbiology, ancillary and laboratory) are listed below for reference.   Imaging Studies: ECHOCARDIOGRAM LIMITED  Result Date: 07/14/2022    ECHOCARDIOGRAM LIMITED  REPORT   Patient Name:   Jessica Torres Date of Exam: 07/14/2022 Medical Rec #:  [id number]      Height:       65.0 in Accession #:    [id number]     Weight:       141.5 lb Date of Birth:  Aug 26, 1940      BSA:          1.708 m Patient Age:    81 years       BP:           107/49 mmHg Patient Gender: F              HR:           87 bpm. [facility] Procedure: Limited Echo Indications:    Elevated Troponin  History:        Patient has prior history of Echocardiogram examinations, most                 recent 07/12/2022. CAD, Signs/Symptoms:Murmur; Risk                 Factors:Hypertension, Diabetes and Dyslipidemia. Sleep Apnea.  Sonographer:    Celesta Gentile RCS Referring Phys: [id number] PAULA V ROSS IMPRESSIONS  1. Limited echo with Definity. LVEF is depressed with hypokinesis of the mid/distal lateral, mid/distal septal, distal inferior and apical walls. Overall LVEF approximately 40 to 45%. FINDINGS  Left Ventricle: Limited echo with Definity. LVEF is depressed with hypokinesis of the mid/distal lateral, mid/distal septal, distal inferior and apical walls. Overall LVEF approximately 40 to 45%. Definity contrast agent was given IV to delineate the left ventricular endocardial borders. Dietrich Pates MD Electronically signed by Dietrich Pates MD Signature Date/Time: 07/14/2022/9:09:17 PM    Final    ECHOCARDIOGRAM COMPLETE  Result Date: 07/12/2022    ECHOCARDIOGRAM REPORT   Patient Name:   Jessica Torres Date of Exam: 07/12/2022 Medical Rec #:  [id number]      Height:       65.0 in Accession #:    [id number]     Weight:       141.5 lb Date of Birth:  05-08-1940      BSA:          1.708 m Patient Age:    19  years       BP:           104/49 mmHg Patient Gender: F              HR:           97 bpm. [facility] Procedure: 2D Echo, Color Doppler and Cardiac Doppler Indications:    elevated troponin  History:        Patient has prior history of Echocardiogram examinations, most                 recent 04/28/2022.  CAD, PAD and chronic kidney disease,                 Signs/Symptoms:Murmur; Risk Factors:Hypertension, Dyslipidemia,                 Diabetes and Sleep Apnea.  Sonographer:    Delcie Roch RDCS Referring Phys: [id number] PRATIK D [facility] IMPRESSIONS  1. Left ventricular ejection fraction, by estimation, is 50 to 55%. The left ventricle has low normal function. The left ventricle demonstrates regional wall motion abnormalities (see scoring diagram/findings for description). Left ventricular diastolic  parameters are indeterminate. The average left ventricular global longitudinal strain is -7.1 %. The global longitudinal strain is abnormal.  2. Right ventricular systolic function is normal. The right ventricular size is normal. There is mildly elevated pulmonary artery systolic pressure. The estimated right ventricular systolic pressure is 44.2 mmHg.  3. The mitral valve is normal in structure. Mild mitral valve regurgitation. No evidence of mitral stenosis.  4. The aortic valve is normal in structure. Aortic valve regurgitation is not visualized. No aortic stenosis is present.  5. The inferior vena cava is normal in size with greater than 50% respiratory variability, suggesting right atrial pressure of 3 mmHg. Comparison(s): Prior images reviewed side by side. The left ventricular function is worsened. FINDINGS  Left Ventricle: Left ventricular ejection fraction, by estimation, is 50 to 55%. The left ventricle has low normal function. The left ventricle demonstrates regional wall motion abnormalities. The average left ventricular global longitudinal strain is -7.1 %. The global longitudinal strain is abnormal. The left ventricular internal cavity size was normal in size. There is no left ventricular hypertrophy. Left ventricular diastolic parameters are indeterminate.  LV Wall Scoring: The apical septal segment is akinetic. Right Ventricle: The right ventricular size is normal. No increase in right ventricular wall  thickness. Right ventricular systolic function is normal. There is mildly elevated pulmonary artery systolic pressure. The tricuspid regurgitant velocity is 3.21  m/s, and with an assumed right atrial pressure of 3 mmHg, the estimated right ventricular systolic pressure is 44.2 mmHg. Left Atrium: Left atrial size was normal in size. Right Atrium: Right atrial size was normal in size. Pericardium: There is no evidence of pericardial effusion. Mitral Valve: The mitral valve is normal in structure. Mild mitral valve regurgitation. No evidence of mitral valve stenosis. Tricuspid Valve: The tricuspid valve is normal in structure. Tricuspid valve regurgitation is mild . No evidence of tricuspid stenosis. Aortic Valve: The aortic valve is normal in structure. Aortic valve regurgitation is not visualized. No aortic stenosis is present. Pulmonic Valve: The pulmonic valve was normal in structure. Pulmonic valve regurgitation is not visualized. No evidence of pulmonic stenosis. Aorta: The aortic root is normal in size and structure. Venous: The inferior vena cava is normal in size with greater than 50% respiratory variability, suggesting right atrial pressure of 3 mmHg. IAS/Shunts: No atrial level  shunt detected by color flow Doppler.  LEFT VENTRICLE PLAX 2D LVIDd:         4.30 cm   Diastology LVIDs:         2.80 cm   LV e' medial:    3.70 cm/s LV PW:         0.80 cm   LV E/e' medial:  34.3 LV IVS:        0.90 cm   LV e' lateral:   9.90 cm/s LVOT diam:     1.60 cm   LV E/e' lateral: 12.8 LV SV:         40 LV SV Index:   23        2D Longitudinal Strain LVOT Area:     2.01 cm  2D Strain GLS Avg:     -7.1 %  RIGHT VENTRICLE             IVC RV Basal diam:  2.30 cm     IVC diam: 2.00 cm RV S prime:     11.40 cm/s TAPSE (M-mode): 1.8 cm LEFT ATRIUM             Index        RIGHT ATRIUM          Index LA diam:        3.20 cm 1.87 cm/m   RA Area:     9.39 cm LA Vol (A2C):   45.8 ml 26.82 ml/m  RA Volume:   20.90 ml 12.24 ml/m LA  Vol (A4C):   25.5 ml 14.93 ml/m LA Biplane Vol: 34.1 ml 19.97 ml/m  AORTIC VALVE LVOT Vmax:   105.00 cm/s LVOT Vmean:  72.800 cm/s LVOT VTI:    0.199 m  AORTA Ao Root diam: 2.40 cm Ao Asc diam:  2.50 cm MITRAL VALVE                TRICUSPID VALVE MV Area (PHT): 5.02 cm     TR Peak grad:   41.2 mmHg MV Decel Time: 151 msec     TR Vmax:        321.00 cm/s MR Peak grad: 98.8 mmHg MR Mean grad: 65.0 mmHg     SHUNTS MR Vmax:      497.00 cm/s   Systemic VTI:  0.20 m MR Vmean:     385.0 cm/s    Systemic Diam: 1.60 cm MV E velocity: 127.00 cm/s MV A velocity: 103.00 cm/s MV E/A ratio:  1.23 Donato Schultz MD Electronically signed by Donato Schultz MD Signature Date/Time: 07/12/2022/3:50:42 PM    Final     Microbiology: Results for orders placed or performed in visit on 10/28/19  Urine Culture     Status: Abnormal   Collection Time: 10/28/19 12:00 AM   Specimen: Urine   Urine  Result Value Ref Range Status   Urine Culture, Routine Final report (A)  Final   Organism ID, Bacteria Escherichia coli (A)  Final    Comment: Cefazolin <=4 ug/mL Cefazolin with an MIC <=16 predicts susceptibility to the oral agents cefaclor, cefdinir, cefpodoxime, cefprozil, cefuroxime, cephalexin, and loracarbef when used for therapy of uncomplicated urinary tract infections due to E. coli, Klebsiella pneumoniae, and Proteus mirabilis. 50,000-100,000 colony forming units per mL    Antimicrobial Susceptibility Comment  Final    Comment:       ** S = Susceptible; I = Intermediate; R = Resistant **  P = Positive; N = Negative             MICS are expressed in micrograms per mL    Antibiotic                 RSLT#1    RSLT#2    RSLT#3    RSLT#4 Amoxicillin/Clavulanic Acid    S Ampicillin                     S Cefepime                       S Ceftriaxone                    S Cefuroxime                     S Ciprofloxacin                  S Ertapenem                      S Gentamicin                      S Imipenem                       S Levofloxacin                   S Meropenem                      S Nitrofurantoin                 S Piperacillin/Tazobactam        S Tetracycline                   S Tobramycin                     S Trimethoprim/Sulfa             S   Microscopic Examination     Status: None   Collection Time: 10/28/19  9:00 AM   Urine  Result Value Ref Range Status   WBC, UA None seen 0 - 5 /hpf Final   RBC, Urine None seen 0 - 2 /hpf Final   Epithelial Cells (non renal) None seen 0 - 10 /hpf Final   Bacteria, UA None seen None seen/Few Final    Labs: CBC: Recent Labs  Lab 07/11/22 1521 07/12/22 0245 07/13/22 0511 07/14/22 0527 07/15/22 0417 07/16/22 0417 07/17/22 0446  WBC 8.1   < > 8.0 7.8 8.4 8.0 8.3  NEUTROABS 5.3  --   --   --   --   --   --   HGB 6.1*   < > 8.7* 9.0* 8.6* 8.9* 8.8*  HCT 19.2*   < > 26.4* 27.1* 26.8* 27.3* 27.2*  MCV 91.0   < > 88.9 90.3 90.5 90.1 91.3  PLT 270   < > 232 246 238 243 230   < > = values in this interval not displayed.   Basic Metabolic Panel: Recent Labs  Lab 07/12/22 0245 07/13/22 0511 07/14/22 0527 07/15/22 0417 07/16/22 0417  NA 132* 131* 131* 131* 133*  K 3.9 3.6 3.8 4.2 4.0  CL 106 104 104 104 106  CO2 19* 21* 20* 20*  21*  GLUCOSE 175* 161* 91 196* 112*  BUN 21 20 16 19 17   CREATININE 1.35* 1.30* 1.27* 1.41* 1.40*  CALCIUM 8.6* 8.7* 8.5* 8.8* 8.6*  MG  --  1.8 1.8 1.9 2.0   Liver Function Tests: Recent Labs  Lab 07/11/22 1521 07/14/22 1148 07/16/22 0940  AST 34 40 42*  ALT 13 12 15   ALKPHOS 56 50 62  BILITOT 2.7* 2.7* 2.3*  PROT 6.7 5.7* 6.5  ALBUMIN 2.9* 2.2* 2.5*   CBG: Recent Labs  Lab 07/16/22 0800 07/16/22 1116 07/16/22 1609 07/16/22 2156 07/17/22 0735  GLUCAP 111* 227* 177* 140* 94    Discharge time spent: greater than 30 minutes.  Signed: Tyrone Nine, MD [facility] 07/17/2022

## 2022-07-18 NOTE — Anesthesia Postprocedure Evaluation (Signed)
Anesthesia Post Note  Patient: RIANNA DICKE  Procedure(s) Performed: ESOPHAGOGASTRODUODENOSCOPY (EGD) WITH PROPOFOL HOT HEMOSTASIS (ARGON PLASMA COAGULATION/BICAP) POLYPECTOMY INTESTINAL  Patient location during evaluation: Phase II Anesthesia Type: General Level of consciousness: awake Pain management: pain level controlled Vital Signs Assessment: post-procedure vital signs reviewed and stable Respiratory status: spontaneous breathing and respiratory function stable Cardiovascular status: blood pressure returned to baseline and stable Postop Assessment: no headache and no apparent nausea or vomiting Anesthetic complications: no Comments: Late entry   No notable events documented.   Last Vitals:  Vitals:   07/17/22 0416 07/17/22 1349  BP: (!) 108/50 (!) 112/49  Pulse: 96 87  Resp: 18 20  Temp: 37.3 C 36.8 C  SpO2: 93% 95%    Last Pain:  Vitals:   07/17/22 1349  TempSrc: Oral  PainSc:                  [place]ell [place]

## 2022-07-21 ENCOUNTER — Encounter (HOSPITAL_COMMUNITY): Payer: Self-pay | Admitting: Gastroenterology

## 2022-08-01 ENCOUNTER — Ambulatory Visit: Payer: Medicare HMO | Admitting: Medical

## 2022-08-25 ENCOUNTER — Ambulatory Visit: Payer: Medicare (Managed Care) | Admitting: Family

## 2022-08-27 ENCOUNTER — Encounter (HOSPITAL_COMMUNITY): Payer: Self-pay

## 2022-10-06 ENCOUNTER — Ambulatory Visit: Payer: Medicare HMO | Admitting: Cardiology

## 2022-10-26 IMAGING — US US RENAL
1 series · 14 of 25 positions shown · non-contrast
Comparison: None.

CLINICAL DATA: Chronic kidney disease.

EXAM:
RENAL / URINARY TRACT ULTRASOUND COMPLETE

[Series 1: us renal · 14 of 64 slices shown]
[im 1/64]
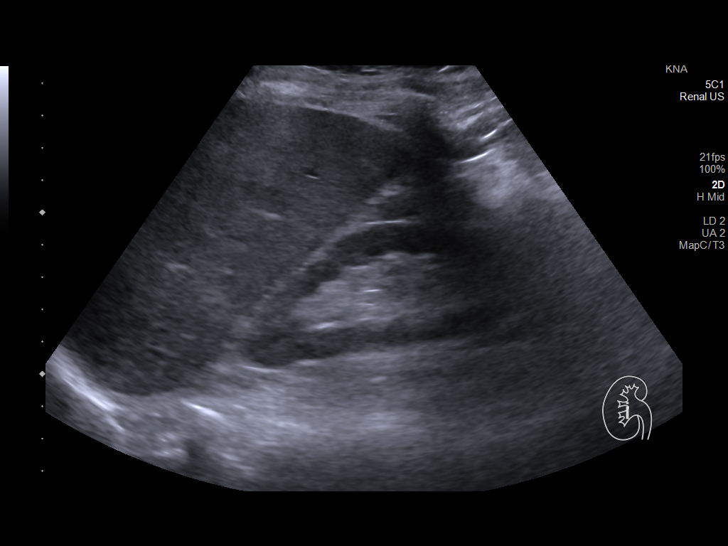
[im 6/64]
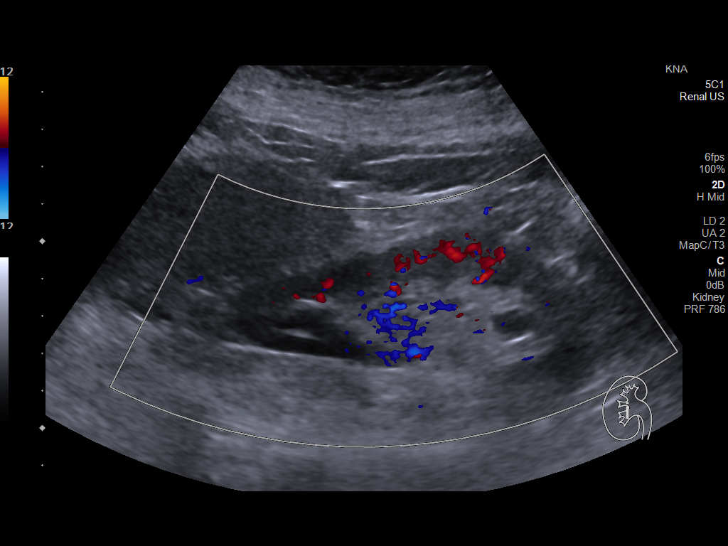
[im 11/64]
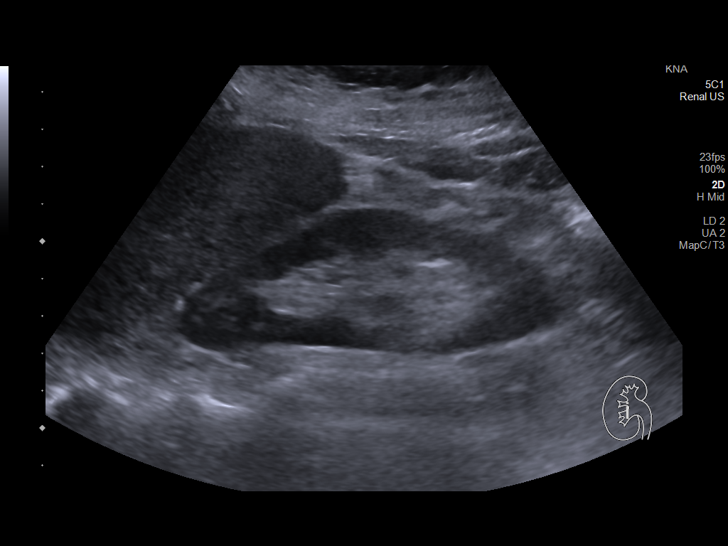
[im 16/64]
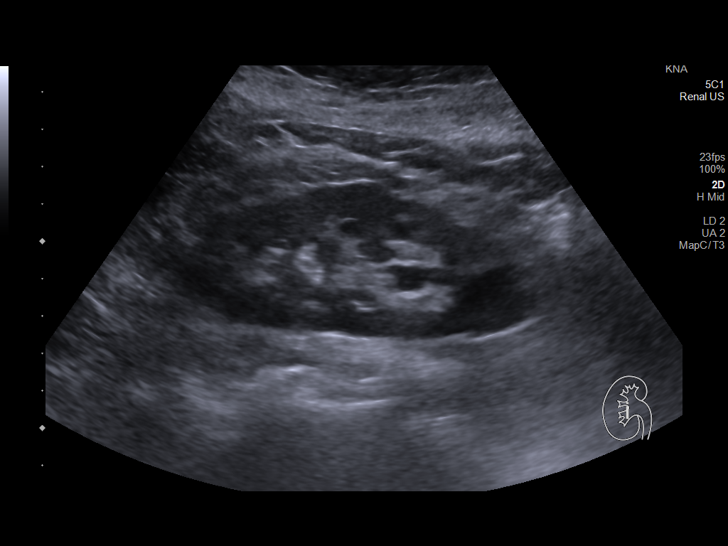
[im 22/64]
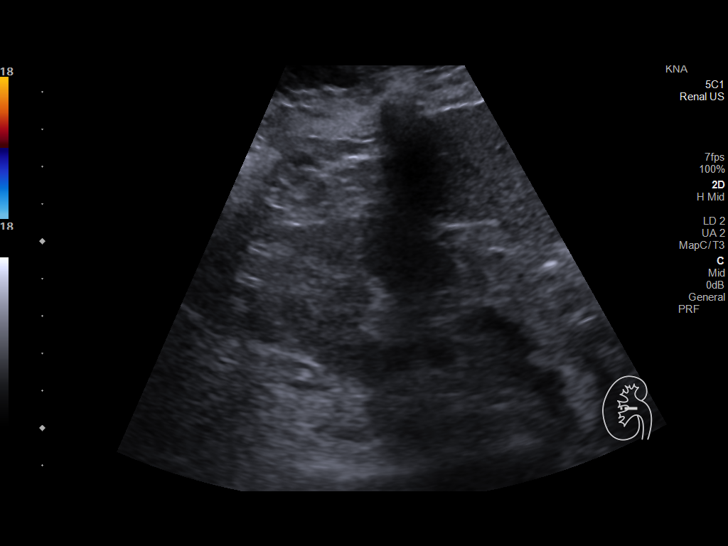
[im 24/64]
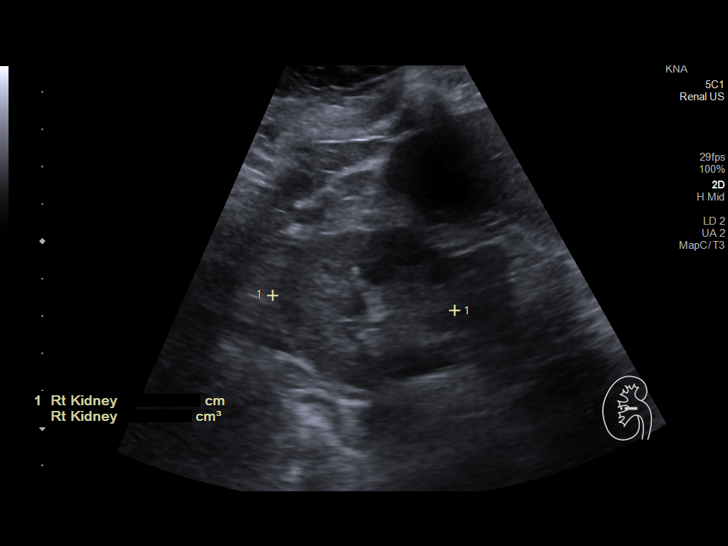
[im 29/64]
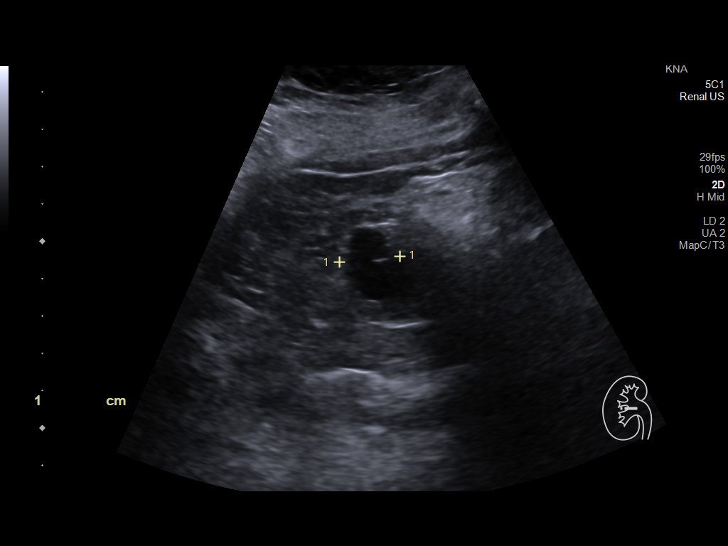
[im 35/64]
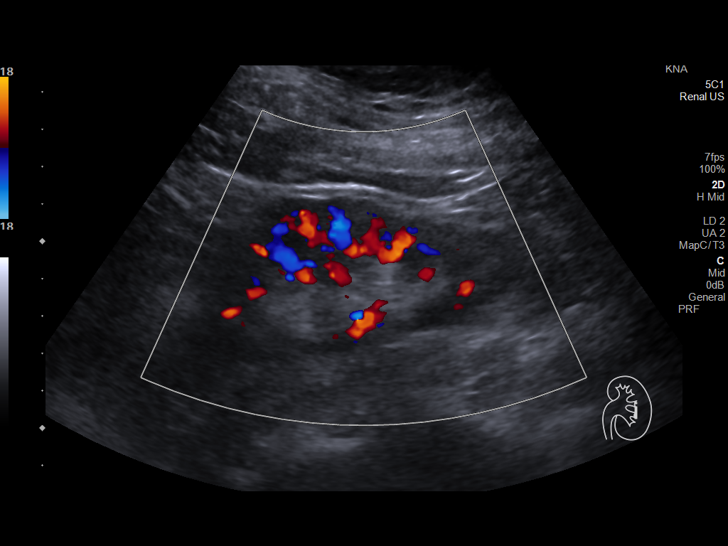
[im 40/64]
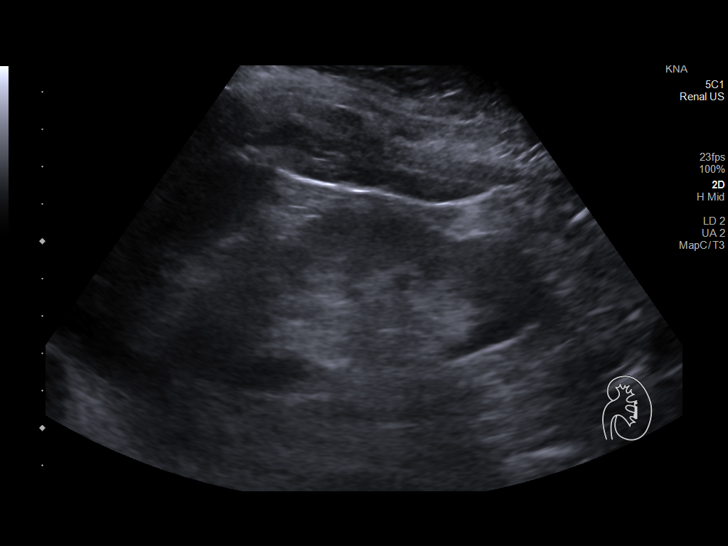
[im 43/64]
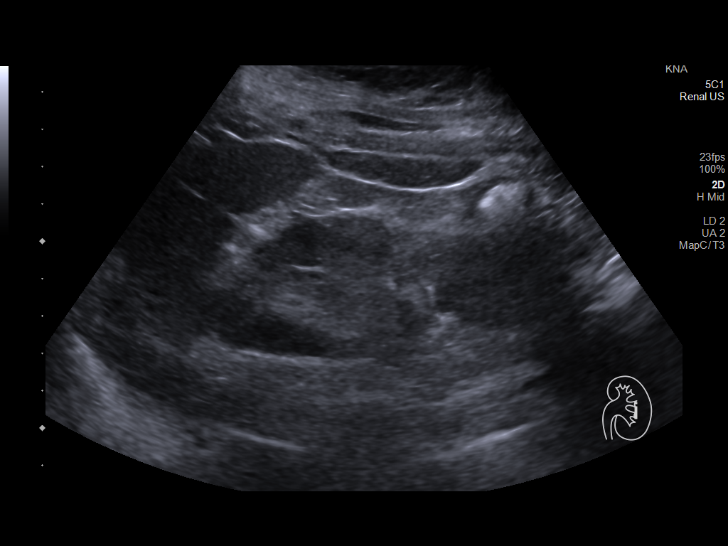
[im 48/64]
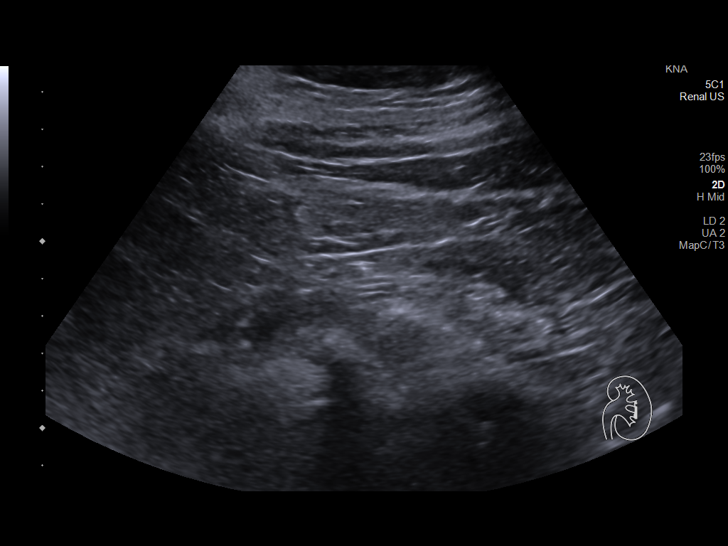
[im 53/64]
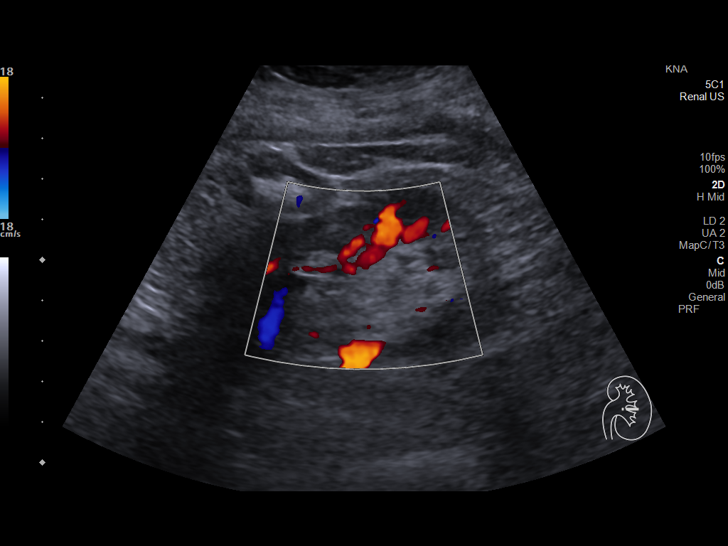
[im 58/64]
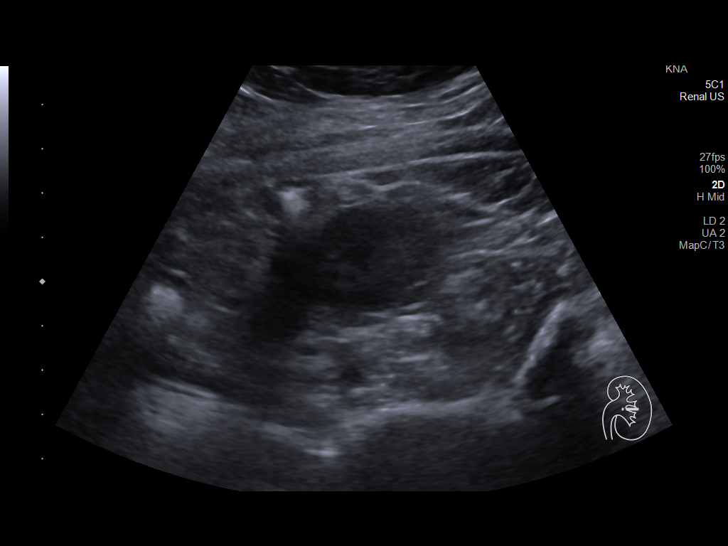
[im 64/64]
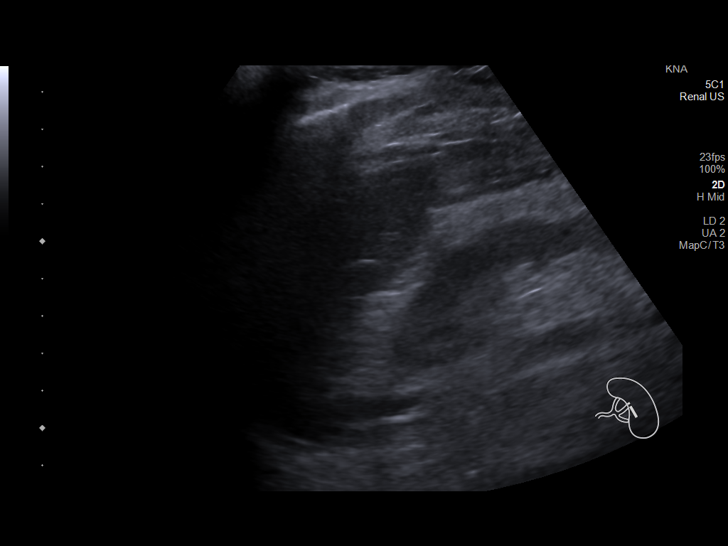

[14 of 25 positions shown; findings below may reference images not displayed]

FINDINGS: Right Kidney:

Renal measurements: 10.1 x 3.9 x 4.9 cm = volume: 100 mL.
Echogenicity within normal limits. No mass or hydronephrosis
visualized. 2.0 cm mostly simple cyst involving the lower pole,
possibly with a thin internal septation.

Left Kidney:

Renal measurements: 10.2 x 4.6 x 3.9 cm = volume: 95 mL.
Echogenicity within normal limits. No mass or hydronephrosis
visualized.

Bladder:

Appears normal for degree of bladder distention.

Other:

None.
IMPRESSION: 1. No acute abnormality or evidence of medical renal disease.

## 2022-10-29 ENCOUNTER — Emergency Department (HOSPITAL_BASED_OUTPATIENT_CLINIC_OR_DEPARTMENT_OTHER): Payer: Medicare HMO | Admitting: Radiology

## 2022-10-29 ENCOUNTER — Encounter (HOSPITAL_BASED_OUTPATIENT_CLINIC_OR_DEPARTMENT_OTHER): Payer: Self-pay | Admitting: Emergency Medicine

## 2022-10-29 ENCOUNTER — Other Ambulatory Visit: Payer: Self-pay

## 2022-10-29 ENCOUNTER — Other Ambulatory Visit (HOSPITAL_BASED_OUTPATIENT_CLINIC_OR_DEPARTMENT_OTHER): Payer: Self-pay

## 2022-10-29 ENCOUNTER — Inpatient Hospital Stay (HOSPITAL_BASED_OUTPATIENT_CLINIC_OR_DEPARTMENT_OTHER)
Admission: EM | Admit: 2022-10-29 | Discharge: 2022-11-07 | DRG: 291 | Disposition: A | Discharge status: Home Health | Payer: Medicare HMO | Attending: Internal Medicine | Admitting: Internal Medicine

## 2022-10-29 DIAGNOSIS — Z79899 Other long term (current) drug therapy: Secondary | ICD-10-CM

## 2022-10-29 DIAGNOSIS — E785 Hyperlipidemia, unspecified: Secondary | ICD-10-CM | POA: Diagnosis present

## 2022-10-29 DIAGNOSIS — K7460 Unspecified cirrhosis of liver: Secondary | ICD-10-CM | POA: Diagnosis present

## 2022-10-29 DIAGNOSIS — Z7401 Bed confinement status: Secondary | ICD-10-CM

## 2022-10-29 DIAGNOSIS — E782 Mixed hyperlipidemia: Secondary | ICD-10-CM | POA: Diagnosis present

## 2022-10-29 DIAGNOSIS — D631 Anemia in chronic kidney disease: Secondary | ICD-10-CM | POA: Diagnosis present

## 2022-10-29 DIAGNOSIS — E1169 Type 2 diabetes mellitus with other specified complication: Secondary | ICD-10-CM | POA: Diagnosis not present

## 2022-10-29 DIAGNOSIS — I5021 Acute systolic (congestive) heart failure: Secondary | ICD-10-CM | POA: Diagnosis not present

## 2022-10-29 DIAGNOSIS — I255 Ischemic cardiomyopathy: Secondary | ICD-10-CM | POA: Diagnosis present

## 2022-10-29 DIAGNOSIS — N179 Acute kidney failure, unspecified: Secondary | ICD-10-CM

## 2022-10-29 DIAGNOSIS — I5043 Acute on chronic combined systolic (congestive) and diastolic (congestive) heart failure: Secondary | ICD-10-CM | POA: Diagnosis present

## 2022-10-29 DIAGNOSIS — I130 Hypertensive heart and chronic kidney disease with heart failure and stage 1 through stage 4 chronic kidney disease, or unspecified chronic kidney disease: Secondary | ICD-10-CM | POA: Diagnosis present

## 2022-10-29 DIAGNOSIS — I2583 Coronary atherosclerosis due to lipid rich plaque: Secondary | ICD-10-CM | POA: Diagnosis present

## 2022-10-29 DIAGNOSIS — D649 Anemia, unspecified: Secondary | ICD-10-CM | POA: Diagnosis present

## 2022-10-29 DIAGNOSIS — E1159 Type 2 diabetes mellitus with other circulatory complications: Secondary | ICD-10-CM

## 2022-10-29 DIAGNOSIS — E1151 Type 2 diabetes mellitus with diabetic peripheral angiopathy without gangrene: Secondary | ICD-10-CM | POA: Diagnosis present

## 2022-10-29 DIAGNOSIS — Z833 Family history of diabetes mellitus: Secondary | ICD-10-CM

## 2022-10-29 DIAGNOSIS — E1122 Type 2 diabetes mellitus with diabetic chronic kidney disease: Secondary | ICD-10-CM | POA: Diagnosis present

## 2022-10-29 DIAGNOSIS — I509 Heart failure, unspecified: Principal | ICD-10-CM

## 2022-10-29 DIAGNOSIS — K31819 Angiodysplasia of stomach and duodenum without bleeding: Secondary | ICD-10-CM

## 2022-10-29 DIAGNOSIS — I739 Peripheral vascular disease, unspecified: Secondary | ICD-10-CM | POA: Diagnosis present

## 2022-10-29 DIAGNOSIS — J9621 Acute and chronic respiratory failure with hypoxia: Secondary | ICD-10-CM | POA: Diagnosis not present

## 2022-10-29 DIAGNOSIS — G4733 Obstructive sleep apnea (adult) (pediatric): Secondary | ICD-10-CM | POA: Diagnosis present

## 2022-10-29 DIAGNOSIS — I3139 Other pericardial effusion (noninflammatory): Secondary | ICD-10-CM | POA: Diagnosis present

## 2022-10-29 DIAGNOSIS — K219 Gastro-esophageal reflux disease without esophagitis: Secondary | ICD-10-CM | POA: Diagnosis present

## 2022-10-29 DIAGNOSIS — I5020 Unspecified systolic (congestive) heart failure: Secondary | ICD-10-CM | POA: Diagnosis present

## 2022-10-29 DIAGNOSIS — E039 Hypothyroidism, unspecified: Secondary | ICD-10-CM | POA: Diagnosis present

## 2022-10-29 DIAGNOSIS — Z515 Encounter for palliative care: Secondary | ICD-10-CM | POA: Diagnosis not present

## 2022-10-29 DIAGNOSIS — I8510 Secondary esophageal varices without bleeding: Secondary | ICD-10-CM | POA: Diagnosis present

## 2022-10-29 DIAGNOSIS — E876 Hypokalemia: Secondary | ICD-10-CM | POA: Diagnosis present

## 2022-10-29 DIAGNOSIS — Z888 Allergy status to other drugs, medicaments and biological substances status: Secondary | ICD-10-CM

## 2022-10-29 DIAGNOSIS — E440 Moderate protein-calorie malnutrition: Secondary | ICD-10-CM | POA: Diagnosis present

## 2022-10-29 DIAGNOSIS — Z794 Long term (current) use of insulin: Secondary | ICD-10-CM

## 2022-10-29 DIAGNOSIS — N184 Chronic kidney disease, stage 4 (severe): Secondary | ICD-10-CM | POA: Diagnosis present

## 2022-10-29 DIAGNOSIS — E871 Hypo-osmolality and hyponatremia: Secondary | ICD-10-CM | POA: Diagnosis present

## 2022-10-29 DIAGNOSIS — E8809 Other disorders of plasma-protein metabolism, not elsewhere classified: Secondary | ICD-10-CM | POA: Diagnosis present

## 2022-10-29 DIAGNOSIS — Z79890 Hormone replacement therapy (postmenopausal): Secondary | ICD-10-CM

## 2022-10-29 DIAGNOSIS — Z7984 Long term (current) use of oral hypoglycemic drugs: Secondary | ICD-10-CM

## 2022-10-29 DIAGNOSIS — I340 Nonrheumatic mitral (valve) insufficiency: Secondary | ICD-10-CM | POA: Diagnosis present

## 2022-10-29 DIAGNOSIS — Z66 Do not resuscitate: Secondary | ICD-10-CM | POA: Diagnosis not present

## 2022-10-29 DIAGNOSIS — J9601 Acute respiratory failure with hypoxia: Secondary | ICD-10-CM | POA: Diagnosis not present

## 2022-10-29 DIAGNOSIS — I5023 Acute on chronic systolic (congestive) heart failure: Secondary | ICD-10-CM

## 2022-10-29 DIAGNOSIS — R053 Chronic cough: Secondary | ICD-10-CM | POA: Diagnosis present

## 2022-10-29 DIAGNOSIS — Z8249 Family history of ischemic heart disease and other diseases of the circulatory system: Secondary | ICD-10-CM

## 2022-10-29 DIAGNOSIS — Z6820 Body mass index (BMI) 20.0-20.9, adult: Secondary | ICD-10-CM

## 2022-10-29 DIAGNOSIS — Z7982 Long term (current) use of aspirin: Secondary | ICD-10-CM

## 2022-10-29 DIAGNOSIS — I959 Hypotension, unspecified: Secondary | ICD-10-CM | POA: Diagnosis not present

## 2022-10-29 DIAGNOSIS — Z8673 Personal history of transient ischemic attack (TIA), and cerebral infarction without residual deficits: Secondary | ICD-10-CM | POA: Diagnosis not present

## 2022-10-29 DIAGNOSIS — D500 Iron deficiency anemia secondary to blood loss (chronic): Secondary | ICD-10-CM | POA: Diagnosis present

## 2022-10-29 DIAGNOSIS — Z7985 Long-term (current) use of injectable non-insulin antidiabetic drugs: Secondary | ICD-10-CM

## 2022-10-29 DIAGNOSIS — Z885 Allergy status to narcotic agent status: Secondary | ICD-10-CM

## 2022-10-29 DIAGNOSIS — J90 Pleural effusion, not elsewhere classified: Secondary | ICD-10-CM | POA: Insufficient documentation

## 2022-10-29 DIAGNOSIS — I2510 Atherosclerotic heart disease of native coronary artery without angina pectoris: Secondary | ICD-10-CM | POA: Diagnosis present

## 2022-10-29 DIAGNOSIS — Z90710 Acquired absence of both cervix and uterus: Secondary | ICD-10-CM

## 2022-10-29 DIAGNOSIS — I252 Old myocardial infarction: Secondary | ICD-10-CM

## 2022-10-29 DIAGNOSIS — E7800 Pure hypercholesterolemia, unspecified: Secondary | ICD-10-CM

## 2022-10-29 DIAGNOSIS — N189 Chronic kidney disease, unspecified: Secondary | ICD-10-CM

## 2022-10-29 DIAGNOSIS — K5900 Constipation, unspecified: Secondary | ICD-10-CM | POA: Diagnosis not present

## 2022-10-29 DIAGNOSIS — Z8542 Personal history of malignant neoplasm of other parts of uterus: Secondary | ICD-10-CM

## 2022-10-29 DIAGNOSIS — Z91040 Latex allergy status: Secondary | ICD-10-CM

## 2022-10-29 DIAGNOSIS — D509 Iron deficiency anemia, unspecified: Secondary | ICD-10-CM | POA: Diagnosis not present

## 2022-10-29 DIAGNOSIS — Z0000 Encounter for general adult medical examination without abnormal findings: Secondary | ICD-10-CM

## 2022-10-29 DIAGNOSIS — Z1152 Encounter for screening for COVID-19: Secondary | ICD-10-CM

## 2022-10-29 DIAGNOSIS — E119 Type 2 diabetes mellitus without complications: Secondary | ICD-10-CM | POA: Insufficient documentation

## 2022-10-29 DIAGNOSIS — R531 Weakness: Secondary | ICD-10-CM | POA: Diagnosis not present

## 2022-10-29 DIAGNOSIS — R54 Age-related physical debility: Secondary | ICD-10-CM | POA: Diagnosis present

## 2022-10-29 LAB — AMMONIA: Ammonia: 21 umol/L (ref 9–35)

## 2022-10-29 LAB — GLUCOSE, CAPILLARY: Glucose-Capillary: 143 mg/dL — ABNORMAL HIGH (ref 70–99)

## 2022-10-29 LAB — BASIC METABOLIC PANEL
Anion gap: 10 (ref 5–15)
BUN: 34 mg/dL — ABNORMAL HIGH (ref 8–23)
CO2: 24 mmol/L (ref 22–32)
Calcium: 9.4 mg/dL (ref 8.9–10.3)
Chloride: 101 mmol/L (ref 98–111)
Creatinine, Ser: 1.89 mg/dL — ABNORMAL HIGH (ref 0.44–1.00)
GFR, Estimated: 27 mL/min — ABNORMAL LOW (ref >60)
Glucose, Bld: 168 mg/dL — ABNORMAL HIGH (ref 70–99)
Potassium: 3.6 mmol/L (ref 3.5–5.1)
Sodium: 135 mmol/L (ref 135–145)

## 2022-10-29 LAB — CBC
HCT: 24.7 % — ABNORMAL LOW (ref 36.0–46.0)
Hemoglobin: 8.1 g/dL — ABNORMAL LOW (ref 12.0–15.0)
MCH: 29.9 pg (ref 26.0–34.0)
MCHC: 32.8 g/dL (ref 30.0–36.0)
MCV: 91.1 fL (ref 80.0–100.0)
Platelets: 207 10*3/uL (ref 150–400)
RBC: 2.71 MIL/uL — ABNORMAL LOW (ref 3.87–5.11)
RDW: 21.4 % — ABNORMAL HIGH (ref 11.5–15.5)
WBC: 6.8 10*3/uL (ref 4.0–10.5)
nRBC: 0 % (ref 0.0–0.2)

## 2022-10-29 LAB — BRAIN NATRIURETIC PEPTIDE: B Natriuretic Peptide: 3983.8 pg/mL — ABNORMAL HIGH (ref 0.0–100.0)

## 2022-10-29 LAB — RESP PANEL BY RT-PCR (RSV, FLU A&B, COVID)  RVPGX2
Influenza A by PCR: NEGATIVE
Influenza B by PCR: NEGATIVE
Resp Syncytial Virus by PCR: NEGATIVE
SARS Coronavirus 2 by RT PCR: NEGATIVE

## 2022-10-29 MED ORDER — ORAL CARE MOUTH RINSE: 15.0000 mL | OROMUCOSAL | Status: DC | PRN

## 2022-10-29 MED ORDER — LEVOTHYROXINE SODIUM 88 MCG PO TABS
88.0000 ug | ORAL_TABLET | Freq: Every day | ORAL | Status: DC
  Administered 2022-10-30 – 2022-11-07 (×9): 88 ug via ORAL
  Filled 2022-10-29 (×9): qty 1

## 2022-10-29 MED ORDER — FENOFIBRATE 160 MG PO TABS
160.0000 mg | ORAL_TABLET | Freq: Every day | ORAL | Status: DC
  Administered 2022-10-30 – 2022-11-07 (×8): 160 mg via ORAL
  Filled 2022-10-29 (×9): qty 1

## 2022-10-29 MED ORDER — FUROSEMIDE 10 MG/ML IJ SOLN
40.0000 mg | Freq: Every day | INTRAMUSCULAR | Status: DC
  Administered 2022-10-30: 40 mg via INTRAVENOUS
  Filled 2022-10-29: qty 4

## 2022-10-29 MED ORDER — METOPROLOL SUCCINATE ER 25 MG PO TB24
25.0000 mg | ORAL_TABLET | Freq: Every day | ORAL | Status: DC
  Administered 2022-10-30 – 2022-11-01 (×3): 25 mg via ORAL
  Filled 2022-10-29 (×4): qty 1

## 2022-10-29 MED ORDER — INSULIN ASPART 100 UNIT/ML IJ SOLN
0.0000 [IU] | Freq: Three times a day (TID) | INTRAMUSCULAR | Status: DC
  Administered 2022-10-30: 2 [IU] via SUBCUTANEOUS

## 2022-10-29 MED ORDER — PANTOPRAZOLE SODIUM 40 MG PO TBEC
40.0000 mg | DELAYED_RELEASE_TABLET | Freq: Two times a day (BID) | ORAL | Status: DC
  Administered 2022-10-29 – 2022-11-07 (×17): 40 mg via ORAL
  Filled 2022-10-29 (×18): qty 1

## 2022-10-29 MED ORDER — FUROSEMIDE 10 MG/ML IJ SOLN
40.0000 mg | Freq: Once | INTRAMUSCULAR | Status: AC
  Administered 2022-10-29: 40 mg via INTRAVENOUS
  Filled 2022-10-29: qty 4

## 2022-10-29 MED ORDER — POLYETHYLENE GLYCOL 3350 17 G PO PACK
17.0000 g | PACK | Freq: Every day | ORAL | Status: DC
  Administered 2022-10-30 – 2022-11-07 (×7): 17 g via ORAL
  Filled 2022-10-29 (×9): qty 1

## 2022-10-29 MED ORDER — ENOXAPARIN SODIUM 30 MG/0.3ML IJ SOSY
30.0000 mg | PREFILLED_SYRINGE | INTRAMUSCULAR | Status: DC
  Administered 2022-10-29 – 2022-11-06 (×9): 30 mg via SUBCUTANEOUS
  Filled 2022-10-29 (×9): qty 0.3

## 2022-10-29 MED ORDER — ASPIRIN 81 MG PO CHEW
81.0000 mg | CHEWABLE_TABLET | Freq: Every day | ORAL | Status: DC
  Administered 2022-10-30 – 2022-11-07 (×8): 81 mg via ORAL
  Filled 2022-10-29 (×9): qty 1

## 2022-10-29 MED ORDER — DOCUSATE SODIUM 100 MG PO CAPS
100.0000 mg | ORAL_CAPSULE | Freq: Every day | ORAL | Status: DC
  Administered 2022-10-30 – 2022-11-07 (×8): 100 mg via ORAL
  Filled 2022-10-29 (×9): qty 1

## 2022-10-29 MED ORDER — EZETIMIBE 10 MG PO TABS
10.0000 mg | ORAL_TABLET | Freq: Every day | ORAL | Status: DC
  Administered 2022-10-30 – 2022-11-07 (×8): 10 mg via ORAL
  Filled 2022-10-29 (×9): qty 1

## 2022-10-29 NOTE — ED Notes (Signed)
Patient transported to X-ray 

## 2022-10-29 NOTE — Assessment & Plan Note (Addendum)
Cardiorenal syndrome, hyponatremia.   Today renal function with serum cr at 2,13 with K at 3,5 and serum bicarbonate at 25. Na 131 and Mg 2.2   Plan to continue diuresis with furosemide IV 80 mg q12 hrs.  Add 40 meq Kcl to prevent hypokalemia.  Follow up renal function in am.

## 2022-10-29 NOTE — ED Notes (Signed)
Pt ambulated to there restroom with even steady slow gait and back to bed with this RN, no apparent distres.

## 2022-10-29 NOTE — ED Notes (Signed)
Pt family x2 remains at the bedside.

## 2022-10-29 NOTE — Assessment & Plan Note (Addendum)
-  continue PPI. Hgb is stable.

## 2022-10-29 NOTE — Progress Notes (Signed)
Rt assessed the Pt and noticed her skin color was yellow. Her oxygen 96% on RA. Breath sounds were clear.

## 2022-10-29 NOTE — Assessment & Plan Note (Addendum)
Continue levothyroxine 

## 2022-10-29 NOTE — Assessment & Plan Note (Addendum)
Echocardiogram with reduction of LV systolic function down to 30 to 35% (worsening from 05/24), severe hypokinesis of the left ventricular mid apical inferoseptal wall, septal wall, lateral wall and apical segment. Small pericardial effusion, moderate to severe mitral regurgitation, RVSP 39.4   Urine output 750 ml,  Systolic blood pressure 89 to 454 mmHg.   Plan to continue diuresis with furosemide 80 mg IV q12  Albumin to improve bioavailability of furosemide.   Continue metoprolol.  Limited pharmacologic options due to reduced GFR and risk of hypotension.  Holding RAAS inhibition and SGLT 2 inh.   Acute hypoxemic respiratory failure, due to acute cardiogenic pulmonary edema. Right pleural effusion sp thoracentesis, 1.2  L removed. Transudate.  02 saturation today is 94% on 2 L.min per [place].

## 2022-10-29 NOTE — ED Notes (Signed)
Sent urine to lab

## 2022-10-29 NOTE — Assessment & Plan Note (Addendum)
-   Continue aspirin, ezetimibe and fibrate.

## 2022-10-29 NOTE — ED Provider Notes (Signed)
[place] EMERGENCY DEPARTMENT AT [facility] Provider Note   CSN: [id number] Arrival date & time: 10/29/22  1207     History  Chief Complaint  Patient presents with   Cough    Jessica Torres is a 82 y.o. female.  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   Patient reports that she is not in better since her hospitalization diagnosis of congestive heart failure.  She reports she continues to be very short of breath with minimal activity and has no energy.  She has a cough productive of thick white sputum.  No fevers.  No chest pain at this time.  Patient reports she does get swelling in her legs.  She reports the only thing is seem to make her feel much better during her hospitalization was oxygen therapy.  She reports she is compliant with her medications.  She wants to know what can be done to make her symptoms better.  I have done extensive review of EMR and the following is from her recent cardiology follow-up after hospitalization.  Seen 10/14/2022 [facility] cardiology: She gives a day to establish care with cardiology after being discharged from the hospital yesterday where she was treated for acute heart failure. I was able to review the information obtained during her hospitalization, including pertinent lab and imaging results. Concern right now is that her left intraocular function may be reduced as the results of progressive coronary disease, which would not be surprising given her peripheral vascular disease. She has several comorbidities, making an ischemic evaluation with coronary angiography and more importantly, stent placement, unattractive. She has a moderate persistent anemia which is  multifactorial but poses some concerns if dual any platelet therapy were to be required. At this time, we will plan for an ischemic evaluation with a nuclear stress test for evaluation but I believe the correct course of action would actually be to treat her medically after going through her information in more detail. We will try to intensify therapy for possible for her situation. I am going to discontinue carvedilol in favor of metoprolol to allow for eventual heart failure medication intensification.     Home Medications Prior to Admission medications   Medication Sig Start Date End Date Taking? Authorizing Provider  Accu-Chek Softclix Lancets lancets Test BS TID Dx E11.9 12/02/21   Jannifer Rodney A, FNP  Alcohol Swabs (B-D SINGLE USE SWABS REGULAR) PADS Test BS 4 times daily Dx E11.9 04/02/21   Junie Spencer, FNP  aspirin 81 MG chewable tablet Chew 1 tablet (81 mg total) by mouth daily. 07/18/22   Tyrone Nine, MD  BD PEN NEEDLE NANO 2ND GEN 32G X 4 MM MISC Use twice daily with insulin Dx E11.9 11/25/21   Jannifer Rodney A, FNP  dapagliflozin propanediol (FARXIGA) 10 MG TABS tablet Take 1 tablet (10 mg total) by mouth daily. 07/18/22   Tyrone Nine, MD  ezetimibe (ZETIA) 10 MG tablet Take 1 tablet (10 mg total) by mouth daily. 08/22/21   Junie Spencer, FNP  fenofibrate 160 MG tablet TAKE 1 TABLET DAILY 04/02/22   Jannifer Rodney A, FNP  ferrous sulfate 325 (65 FE) MG EC tablet Take 1 tablet (325 mg total) by mouth every Monday, Wednesday, and Friday. Patient not taking: Reported on  07/12/2022 04/30/22 06/25/22  Zigmund Daniel., MD  glucose blood (FREESTYLE LITE) test strip Test BS TID Dx E11.9 11/25/21   Jannifer Rodney A, FNP  insulin glargine, 2 Unit Dial, (TOUJEO MAX SOLOSTAR) 300 UNIT/ML Solostar Pen INJECT 52 UNITS UNDER THE SKIN DAILY Patient taking differently: Inject 26 Units into the skin at bedtime. 03/18/22   Jannifer Rodney A, FNP  insulin lispro (HUMALOG) 100 UNIT/ML KwikPen Inject  10-20 Units into the skin 3 (three) times daily. Patient taking differently: Inject 20-22 Units into the skin at bedtime. 08/22/21   Junie Spencer, FNP  levothyroxine (SYNTHROID) 88 MCG tablet TAKE 1 TABLET DAILY BEFORE BREAKFAST Patient taking differently: Take 88 mcg by mouth daily before breakfast. 04/24/22   Jannifer Rodney A, FNP  ondansetron (ZOFRAN) 4 MG tablet Take 4 mg by mouth every 8 (eight) hours as needed for vomiting or nausea. For 7 days    [provider]  pantoprazole (PROTONIX) 40 MG tablet Take 1 tablet (40 mg total) by mouth 2 (two) times daily. Follow up with gastroenterology for additional recommendations 07/17/22   Tyrone Nine, MD  Semaglutide,0.25 or 0.5MG /DOS, 2 MG/3ML SOPN Inject 0.25 mg into the skin once a week. Patient not taking: Reported on 04/27/2022 03/14/22   Junie Spencer, FNP  metFORMIN (GLUCOPHAGE) 1000 MG tablet Take 1 tablet (1,000 mg total) by mouth 2 (two) times daily with a meal. Patient taking differently: Take 1,000 mg by mouth 2 (two) times daily with a meal. Only taking once a day 04/06/19 07/13/19  Remus Loffler, PA-C      Allergies    Morphine and codeine, Latex, and Statins    Review of Systems   Review of Systems  Physical Exam Updated Vital Signs BP 119/64   Pulse 81   Temp 98.3 F (36.8 C) (Oral)   Resp 20   Ht 5\' 5"  (1.651 m)   Wt 57.1 kg   SpO2 94%   BMI 20.93 kg/m  Physical Exam Constitutional:      Comments: Frail-appearing patient.  Nontoxic.  No respiratory distress at rest.  HENT:     Mouth/Throat:     Pharynx: Oropharynx is clear.  Eyes:     Extraocular Movements: Extraocular movements intact.  Cardiovascular:     Rate and Rhythm: Normal rate and regular rhythm.  Pulmonary:     Comments: Mild increased work of breathing.  Intermittent harsh cough.  Crackles bilateral lung fields mid lung fields down.  Breath sounds are soft to the right base. Abdominal:     General: There is no distension.     Palpations:  Abdomen is soft.     Tenderness: There is no abdominal tenderness. There is no guarding.  Musculoskeletal:     Comments: 2+ pitting edema bilateral lower extremities symmetric.  Skin:    General: Skin is warm and dry.     Coloration: Skin is pale.  Neurological:     General: No focal deficit present.     Mental Status: She is oriented to person, place, and time.     Comments: Patient is generally weak but does not have any localizing weakness.  Speech is clear.     ED Results / Procedures / Treatments   Labs (all labs ordered are listed, but only abnormal results are displayed) Labs Reviewed  BASIC METABOLIC PANEL - Abnormal; Notable for the following components:      Result Value   Glucose, Bld 168 (*)  BUN 34 (*)    Creatinine, Ser 1.89 (*)    GFR, Estimated 27 (*)    All other components within normal limits  CBC - Abnormal; Notable for the following components:   RBC 2.71 (*)    Hemoglobin 8.1 (*)    HCT 24.7 (*)    RDW 21.4 (*)    All other components within normal limits  BRAIN NATRIURETIC PEPTIDE - Abnormal; Notable for the following components:   B Natriuretic Peptide 3,983.8 (*)    All other components within normal limits  RESP PANEL BY RT-PCR (RSV, FLU A&B, COVID)  [id number]  AMMONIA    EKG EKG Interpretation Date/Time:  Wednesday October 29 2022 12:13:18 EDT Ventricular Rate:  86 PR Interval:  184 QRS Duration:  83 QT Interval:  338 QTC Calculation: 405 R Axis:   86  Text Interpretation: Sinus rhythm Anterior infarct, old Nonspecific repol abnormality, lateral leads inferior lateral ST depression seen on previous tracing less pronounced todays tracing Confirmed by Arby Barrette [phone]) on 10/29/2022 12:19:34 PM  Radiology DG Chest 2 View  Result Date: 10/29/2022 CLINICAL DATA:  Cough and shortness of breath EXAM: CHEST - 2 VIEW COMPARISON:  X-ray 04/27/2022 FINDINGS: Underinflation. Bilateral pleural effusions and adjacent opacities. No pneumothorax.  Vascular congestion. Mild interstitial prominence. Component of edema is not excluded. Recommend follow-up. Overlapping cardiac leads IMPRESSION: Developing bilateral pleural effusions with the adjacent opacities and interstitial changes. Recommend follow-up Electronically Signed   By: Karen Kays M.D.   On: 10/29/2022 13:56    Procedures Procedures    Medications Ordered in ED Medications  furosemide (LASIX) injection 40 mg (40 mg Intravenous Given 10/29/22 1403)    ED Course/ Medical Decision Making/ A&P                                 Medical Decision Making Amount and/or Complexity of Data Reviewed Labs: ordered. Radiology: ordered.  Risk Prescription drug management.   Patient presents with known significant medical conditions of congestive heart failure, chronic anemia, chronic kidney disease.  She has been having decline and worsening symptoms and is now frustrated with symptomatic shortness of breath and cough.  Examination, patient has significant crackles and lower extremity edema consistent with CHF exacerbation.  Chest x-ray reviewed by myself shows significant vascular congestion and possibly opacification right lung base.  BMP 3983 GFR 27 potassium 3.6 white count 6.8 hemoglobin 8.1 hematocrit 24 platelets 207  Patient's blood pressures are normotensive.  At this time will initiate Lasix for diuresis.  EKG does not have acute ischemic changes.  There are ST depressions but these are less than on prior tracing.  Currently I feel most symptoms are secondary to CHF.  Patient's son had a conversation with me and advised that he has educate himself on CHF and thinks that his mother has end-stage CHF.  He is interested in discussing comfort measures and think she would benefit from home oxygen for her comfort and in-home assistance with ongoing home palliative care.  Consult: Dr. [name] for admission [facility] hospitalist        Final Clinical Impression(s) / ED  Diagnoses Final diagnoses:  Acute on chronic congestive heart failure, unspecified heart failure type [facility])    Rx / DC Orders ED Discharge Orders     None         Arby Barrette, MD 10/29/22 1510

## 2022-10-29 NOTE — ED Notes (Signed)
Called [facility] to transport patient to [facility] Rm# [id number]

## 2022-10-29 NOTE — Assessment & Plan Note (Deleted)
-   Continue Zetia and fenofibrate

## 2022-10-29 NOTE — Plan of Care (Signed)
Transfer from [facility] from Dr. Clarice Pole Jessica Torres is a 82 y/o female with pmh HTN, HLD, systolic CHF, PVD with claudication, cirrhoisism CVA, and GAVE who presented with complaints of shortness of breath.  Vital signs relatively stable.  Labs significant for BNP 3983.81, BUN 34, creatinine 1.89.  Recent hospitalization for the same at [facility].  Patient thought to be possibly end-stage.  Patient had been given Lasix 40 mg IV x 1 dose.  Orders placed for a telemetry bed here at [facility].

## 2022-10-29 NOTE — ED Notes (Signed)
Pt returned from XR at this time, ambulated into restroom with this RN, even steady slow gait noted, no apparent distress. Pt ambulated back to bed with even steady slow gait, NAD, pt family now at the bedside. Pt placed back on cardiac, BP and SpO2 monitor, warm blankets provided.

## 2022-10-29 NOTE — Assessment & Plan Note (Signed)
-  daily bowel regimen with stool softener and Miralax

## 2022-10-29 NOTE — H&P (Signed)
History and Physical    [name] MVH:[id number] DOB: [birth year] DOA: 10/29/2022 DOS: the patient was seen and examined on 10/29/2022 PCP: Rebekah Chesterfield, NP  Patient coming from:  [facility]  Chief Complaint:  Chief Complaint  Patient presents with   Cough   HPI: Jessica Torres is a 82 y.o. female with medical history significant of PAD with claudication, NSTEMI, Cirrhosis, GAVE, Type 2 diabetes, hypothyroidism, CKD 3b, iron deficiency anemia who presents with shortness of breath and cough.   She was recently hospitalized at [facility] for 3 days in July for acute on chronic systolic heart failure. She had difficulty with IV diuresis due to hypotension. Her metoprolol was changed to Coreg at discharge. She then had follow up with [facility] cardiology on 8/4 and had her Coreg discontinued and metoprolol succinate was resumed at 25mg  daily. Entresto initiation was deferred. Per cardiology, plan for pharmacologic nuclear stress test. Unless nuclear stress test shows severe reversible abnormality or the patient has new or emergent symptoms, they will most likely treat her cardiomyopathy medically.   States that her symptoms have not changed despite hospitalization. Says it all started with a productive cough around Christmas leading up to her recent diagnosis of heart failure. At night she continues to have orthopnea. Says she can breathe through her nose but when she breaths through her mouth she would cough up thick white sputum. Also has been having intermittent nosebleeds and today coughed up small amount of blood in ER. Also coughs throughout the day and has dyspnea with exertion. Has bilateral LE edema. Reports compliance with her medication as her she currently lives with her son's family and they help her with her medications. Says she just started taking metoprolol today because she saw it said to take 4 days a day and she was afraid to do that. She was asked to double up  on her Lasix today to 40mg .  Has been using no salt substitute in her diet. Has been drinking less than the 64 oz that she was advised to do. No tobacco or alcohol use. Also reports constipation and cannot recall last bowel movement. Started drinking prune juice today.   In the ED, she was afebrile, normotensive on room air.  No leukocytosis, stable anemia Hgb of 8.1. BMP notable for worsening creatinine of 1.89 from 1.40.   BNP of 3983. CXR with worsening bilateral pleural effusions with opacities.   She was given IV 40mg  of Lasix and transferred here to [facility] cone for further management.   Review of Systems: As mentioned in the history of present illness. All other systems reviewed and are negative. Past Medical History:  Diagnosis Date   Arthritis    "fingers probably" (10/30/2016)   Bulging lumbar disc    Chronic lower back pain    Difficulty urinating    GERD (gastroesophageal reflux disease)    Heart murmur    hx   Hyperlipidemia    statin intolerant   Hypertension    Hypothyroidism    PAD (peripheral artery disease) (HCC)    Sleep apnea    "dx'd years ago; had severe problems; all of the sudden it just went away" (07/06/2014) *still gone" (10/30/2016)   Stroke [facility]) ~ 2010   denies residual on 10/30/2016   Type 2 diabetes mellitus (HCC)    Urgency of urination    Uterine cancer [facility])    Past Surgical History:  Procedure Laterality Date   ANGIOPLASTY Right 01/22/2015  w/ atherectomy R sfa   BALLOON ANGIOPLASTY, ARTERY Right    SFA/notes 07/06/2014   BIOPSY  04/28/2022   Procedure: BIOPSY;  Surgeon: Tressia Danas, MD;  Location: [facility] ENDOSCOPY;  Service: Gastroenterology;;   BREAST EXCISIONAL BIOPSY Left    BREAST SURGERY     CARPAL TUNNEL RELEASE Right 1980's   COLONOSCOPY WITH PROPOFOL N/A 11/26/2020   Procedure: COLONOSCOPY WITH PROPOFOL;  Surgeon: Lanelle Bal, DO;  [facility] ENDO [facility];  Service: Endoscopy;  Laterality: N/A;  9:00am, pt cannot come in  earlier   DILATION AND CURETTAGE OF UTERUS     ESOPHAGOGASTRODUODENOSCOPY N/A 04/28/2022   Procedure: ESOPHAGOGASTRODUODENOSCOPY (EGD);  Surgeon: Tressia Danas, MD;  Location: [facility] ENDOSCOPY;  Service: Gastroenterology;  Laterality: N/A;   ESOPHAGOGASTRODUODENOSCOPY (EGD) WITH PROPOFOL N/A 04/30/2022   Procedure: ESOPHAGOGASTRODUODENOSCOPY (EGD) WITH PROPOFOL;  Surgeon: Tressia Danas, MD;  Location: [facility] ENDOSCOPY;  Service: Gastroenterology;  Laterality: N/A;  With APC   ESOPHAGOGASTRODUODENOSCOPY (EGD) WITH PROPOFOL N/A 07/15/2022   Procedure: ESOPHAGOGASTRODUODENOSCOPY (EGD) WITH PROPOFOL;  Surgeon: Dolores Frame, MD;  [facility];  Service: Gastroenterology;  Laterality: N/A;   HEMORRHOID SURGERY     "dr lanced it; in dr's office"   HOT HEMOSTASIS N/A 04/30/2022   Procedure: HOT HEMOSTASIS (ARGON PLASMA COAGULATION/BICAP);  Surgeon: Tressia Danas, MD;  Location: [facility] ENDOSCOPY;  Service: Gastroenterology;  Laterality: N/A;   HOT HEMOSTASIS  07/15/2022   Procedure: HOT HEMOSTASIS (ARGON PLASMA COAGULATION/BICAP);  Surgeon: Marguerita Merles, Reuel Boom, MD;  Location[facility];  Service: Gastroenterology;;   INCISION AND DRAINAGE BREAST ABSCESS Left    LOWER EXTREMITY ANGIOGRAM N/A 07/06/2014   Procedure: LOWER EXTREMITY ANGIOGRAM & R-SFA atherectomy and balloon angioplasty;  Surgeon: Runell Gess, MD;  Location: [facility] CATH LAB;  Service: Cardiovascular;  Laterality: R   LOWER EXTREMITY ANGIOGRAM  10/30/2016   LOWER EXTREMITY ANGIOGRAPHY N/A 10/30/2016   Procedure: Lower Extremity Angiography;  Surgeon: Runell Gess, MD;  Location: [place] INVASIVE CV LAB;  Service: Cardiovascular;  Laterality: N/A;   PERIPHERAL VASCULAR CATHETERIZATION N/A 08/17/2014   Procedure: Lower Extremity Angiography;  Surgeon: Runell Gess, MD;  Location: [facility] INVASIVE CV LAB;  Service: Cardiovascular;  Laterality: N/A;   PERIPHERAL VASCULAR CATHETERIZATION Left 08/17/2014   Procedure:  Peripheral Vascular Atherectomy and Viabahn stent L-SFA;  Surgeon: Runell Gess, MD; Laterality: Left; L-SFA   PERIPHERAL VASCULAR CATHETERIZATION N/A 01/11/2015   Procedure: Lower Extremity Angiography;  Surgeon: Runell Gess, MD;  Location: [facility] INVASIVE CV LAB;  Service: Cardiovascular;  Laterality: N/A;   PERIPHERAL VASCULAR CATHETERIZATION Right 01/22/2015   Procedure: Peripheral Vascular Atherectomy;  Surgeon: Runell Gess, MD;  Location: [facility] INVASIVE CV LAB;  Service: Cardiovascular;  Laterality: Right;   POLYPECTOMY  11/26/2020   Procedure: POLYPECTOMY;  Surgeon: Lanelle Bal, DO;  [facility];  Service: Endoscopy;;   POLYPECTOMY  07/15/2022   Procedure: POLYPECTOMY INTESTINAL;  Surgeon: Dolores Frame, MD;  [facility];  Service: Gastroenterology;;   TUBAL LIGATION     VAGINAL HYSTERECTOMY     Social History:  reports that she has never smoked. She has never used smokeless tobacco. She reports current alcohol use. She reports that she does not use drugs.  Allergies  Allergen Reactions   Morphine And Codeine Swelling and Other (See Comments)    Facial swelling   Latex Rash   Statins Other (See Comments)    Myalgias     Family History  Problem Relation Age  of Onset   Heart attack Mother    Heart disease Mother    Arrhythmia Mother        has PPM   Heart disease Father    Diabetes Sister    Breast cancer Neg Hx     Prior to Admission medications   Medication Sig Start Date End Date Taking? Authorizing Provider  Accu-Chek Softclix Lancets lancets Test BS TID Dx E11.9 12/02/21  Yes Jannifer Rodney A, FNP  aspirin 81 MG chewable tablet Chew 1 tablet (81 mg total) by mouth daily. 07/18/22  Yes Tyrone Nine, MD  dapagliflozin propanediol (FARXIGA) 10 MG TABS tablet Take 1 tablet (10 mg total) by mouth daily. 07/18/22  Yes Tyrone Nine, MD  empagliflozin (JARDIANCE) 25 MG TABS tablet Take 25 mg by mouth daily.   Yes [provider]  ezetimibe (ZETIA) 10 MG tablet Take 1 tablet (10 mg total) by mouth daily. 08/22/21  Yes Hawks, Christy A, FNP  pantoprazole (PROTONIX) 40 MG tablet Take 1 tablet (40 mg total) by mouth 2 (two) times daily. Follow up with gastroenterology for additional recommendations 07/17/22  Yes Tyrone Nine, MD  Alcohol Swabs (B-D SINGLE USE SWABS REGULAR) PADS Test BS 4 times daily Dx E11.9 04/02/21   Junie Spencer, FNP  BD PEN NEEDLE NANO 2ND GEN 32G X 4 MM MISC Use twice daily with insulin Dx E11.9 11/25/21   Jannifer Rodney A, FNP  fenofibrate 160 MG tablet TAKE 1 TABLET DAILY 04/02/22   Jannifer Rodney A, FNP  ferrous sulfate 325 (65 FE) MG EC tablet Take 1 tablet (325 mg total) by mouth every Monday, Wednesday, and Friday. Patient not taking: Reported on 07/12/2022 04/30/22 06/25/22  Zigmund Daniel., MD  glucose blood (FREESTYLE LITE) test strip Test BS TID Dx E11.9 11/25/21   Jannifer Rodney A, FNP  insulin glargine, 2 Unit Dial, (TOUJEO MAX SOLOSTAR) 300 UNIT/ML Solostar Pen INJECT 52 UNITS UNDER THE SKIN DAILY Patient taking differently: Inject 26 Units into the skin at bedtime. 03/18/22   Jannifer Rodney A, FNP  insulin lispro (HUMALOG) 100 UNIT/ML KwikPen Inject 10-20 Units into the skin 3 (three) times daily. Patient taking differently: Inject 20-22 Units into the skin at bedtime. 08/22/21   Junie Spencer, FNP  levothyroxine (SYNTHROID) 88 MCG tablet TAKE 1 TABLET DAILY BEFORE BREAKFAST Patient taking differently: Take 88 mcg by mouth daily before breakfast. 04/24/22   Jannifer Rodney A, FNP  ondansetron (ZOFRAN) 4 MG tablet Take 4 mg by mouth every 8 (eight) hours as needed for vomiting or nausea. For 7 days    [provider]  Semaglutide,0.25 or 0.5MG /DOS, 2 MG/3ML SOPN Inject 0.25 mg into the skin once a week. Patient not taking: Reported on 04/27/2022 03/14/22   Junie Spencer, FNP  metFORMIN (GLUCOPHAGE) 1000 MG tablet Take 1 tablet (1,000 mg total) by mouth 2 (two) times  daily with a meal. Patient taking differently: Take 1,000 mg by mouth 2 (two) times daily with a meal. Only taking once a day 04/06/19 07/13/19  Remus Loffler, PA-C    Physical Exam: Vitals:   10/29/22 1830 10/29/22 1939 10/29/22 1942 10/29/22 2000  BP: (!) 117/57 (!) 109/49    Pulse: 87 80  80  Resp: (!) 23 17 20 19   Temp:  98.4 F (36.9 C)  98.4 F (36.9 C)  TempSrc:  Oral    SpO2: 95% 90%    Weight:  57 kg  Height:  5\' 5"  (1.651 m)     Constitutional: NAD, calm, comfortable, thin frail elderly female sitting upright in bed Eyes: lids and conjunctivae normal ENMT: Mucous membranes are moist. Nasal passage is clear without polyps or active nosebleed.  Neck: normal, supple Respiratory: bibasilar crackles with diminished right base breath sounds. Normal respiratory effort. No accessory muscle use.  Cardiovascular: Regular rate and rhythm, no murmurs / rubs / gallops. +3 pitting edema of bilateral lower extremity at distal pre-tibial region Abdomen: no tenderness, soft, non-distended, Bowel sounds positive.  Musculoskeletal: no clubbing / cyanosis. No joint deformity upper and lower extremities.  Normal muscle tone.  Skin: no rashes, lesions, ulcers. No induration Neurologic: CN 2-12 grossly intact.  Psychiatric: Normal judgment and insight. Alert and oriented x 3. Normal mood.   Data Reviewed:  See HPI  Assessment and Plan: * Systolic congestive heart failure (HCC) Acute on chronic systolic heart failure -recent echocardiogram at [facility] hospitalization demonstrated mild LV dysfunction with an EF of 40-45% and evidence of anteroapical hypokinesis and mild to moderate mitral valve regurgitation.  -she recently followed up with [facility] cardiology on 8/4 with planned pharmacologic nuclear stress test although unless it shows severe reversible abnormality they will likely optimize her medical treatment  -continue daily IV 40mg  Lasix (on 20mg  at home) -follow intake and output -daily  weights    DM (diabetes mellitus) (HCC) Insulin dependent  Controlled with recent HA1C at 6.4% -place on SSI  History of CVA (cerebrovascular accident) Continue daily aspirin  Hyperlipidemia-statin intolerant -continue Zetia and fenofibrate  Hypothyroidism -continue levothyroxine  Constipation -daily bowel regimen with stool softener and Miralax  Acute kidney injury superimposed on chronic kidney disease (HCC) -creatinine of 1.89 from 1.40. -likely cardiorenal -follow renal function closely while receiving IV diuresis  GAVE (gastric antral vascular ectasia) -continue PPI. Hgb is stable.       Advance Care Planning: Full  Consults: none  Family Communication: none at bedside  Severity of Illness: The appropriate patient status for this patient is INPATIENT. Inpatient status is judged to be reasonable and necessary in order to provide the required intensity of service to ensure the patient's safety. The patient's presenting symptoms, physical exam findings, and initial radiographic and laboratory data in the context of their chronic comorbidities is felt to place them at high risk for further clinical deterioration. Furthermore, it is not anticipated that the patient will be medically stable for discharge from the hospital within 2 midnights of admission.   * I certify that at the point of admission it is my clinical judgment that the patient will require inpatient hospital care spanning beyond 2 midnights from the point of admission due to high intensity of service, high risk for further deterioration and high frequency of surveillance required.*  Author: Anselm Jungling, DO 10/29/2022 9:59 PM  For on call review [email or web address].

## 2022-10-29 NOTE — Assessment & Plan Note (Addendum)
Continue insulin sliding scale for glucose cover and monitoring.  Fasting glucose this am 163 mg/dl.   Continue fenofibrate and ezetimibe.

## 2022-10-29 NOTE — ED Triage Notes (Signed)
Pt via [facility] from home with cough x 2-3 days; pt states she has been going to her pcp for months, began going for "sinus issues" but was finally diagnosed with heart failure.  She reports that she has been to 6 hospitals since Christmas and that she has a provider for heart failure but they aren't doing anything to help her and her breathing is impacted. Pt alert & oriented; color is very yellow. NAD noted.

## 2022-10-29 NOTE — ED Notes (Signed)
Walked patient to restroom. Patient had urine output of 300 ML. Walked Patient back to bed, is hooked up to monitor with call bell within reach. Family member is at bedside at this time.

## 2022-10-30 ENCOUNTER — Encounter (HOSPITAL_COMMUNITY): Payer: Self-pay | Admitting: Internal Medicine

## 2022-10-30 DIAGNOSIS — I2510 Atherosclerotic heart disease of native coronary artery without angina pectoris: Secondary | ICD-10-CM

## 2022-10-30 DIAGNOSIS — I5043 Acute on chronic combined systolic (congestive) and diastolic (congestive) heart failure: Secondary | ICD-10-CM

## 2022-10-30 DIAGNOSIS — E871 Hypo-osmolality and hyponatremia: Secondary | ICD-10-CM

## 2022-10-30 DIAGNOSIS — D631 Anemia in chronic kidney disease: Secondary | ICD-10-CM

## 2022-10-30 DIAGNOSIS — R053 Chronic cough: Secondary | ICD-10-CM | POA: Diagnosis not present

## 2022-10-30 DIAGNOSIS — I2583 Coronary atherosclerosis due to lipid rich plaque: Secondary | ICD-10-CM

## 2022-10-30 DIAGNOSIS — K7460 Unspecified cirrhosis of liver: Secondary | ICD-10-CM

## 2022-10-30 DIAGNOSIS — N179 Acute kidney failure, unspecified: Secondary | ICD-10-CM | POA: Diagnosis not present

## 2022-10-30 DIAGNOSIS — E1159 Type 2 diabetes mellitus with other circulatory complications: Secondary | ICD-10-CM | POA: Diagnosis not present

## 2022-10-30 LAB — CBC
HCT: 23.4 % — ABNORMAL LOW (ref 36.0–46.0)
Hemoglobin: 7.7 g/dL — ABNORMAL LOW (ref 12.0–15.0)
MCH: 29.5 pg (ref 26.0–34.0)
MCHC: 32.9 g/dL (ref 30.0–36.0)
MCV: 89.7 fL (ref 80.0–100.0)
Platelets: 209 10*3/uL (ref 150–400)
RBC: 2.61 MIL/uL — ABNORMAL LOW (ref 3.87–5.11)
RDW: 20.8 % — ABNORMAL HIGH (ref 11.5–15.5)
WBC: 6.7 10*3/uL (ref 4.0–10.5)
nRBC: 0 % (ref 0.0–0.2)

## 2022-10-30 LAB — BASIC METABOLIC PANEL
Anion gap: 12 (ref 5–15)
BUN: 30 mg/dL — ABNORMAL HIGH (ref 8–23)
CO2: 20 mmol/L — ABNORMAL LOW (ref 22–32)
Calcium: 8.5 mg/dL — ABNORMAL LOW (ref 8.9–10.3)
Chloride: 99 mmol/L (ref 98–111)
Creatinine, Ser: 2.1 mg/dL — ABNORMAL HIGH (ref 0.44–1.00)
GFR, Estimated: 24 mL/min — ABNORMAL LOW (ref >60)
Glucose, Bld: 267 mg/dL — ABNORMAL HIGH (ref 70–99)
Potassium: 4 mmol/L (ref 3.5–5.1)
Sodium: 131 mmol/L — ABNORMAL LOW (ref 135–145)

## 2022-10-30 LAB — GLUCOSE, CAPILLARY
Glucose-Capillary: 152 mg/dL — ABNORMAL HIGH (ref 70–99)
Glucose-Capillary: 187 mg/dL — ABNORMAL HIGH (ref 70–99)
Glucose-Capillary: 209 mg/dL — ABNORMAL HIGH (ref 70–99)
Glucose-Capillary: 164 mg/dL — ABNORMAL HIGH (ref 70–99)

## 2022-10-30 LAB — PROCALCITONIN: Procalcitonin: 0.13 ng/mL

## 2022-10-30 MED ORDER — ONDANSETRON HCL 4 MG/2ML IJ SOLN
4.0000 mg | Freq: Four times a day (QID) | INTRAMUSCULAR | Status: AC | PRN
  Administered 2022-10-30 – 2022-10-31 (×3): 4 mg via INTRAVENOUS
  Filled 2022-10-30 (×3): qty 2

## 2022-10-30 MED ORDER — INSULIN ASPART 100 UNIT/ML IJ SOLN
0.0000 [IU] | Freq: Three times a day (TID) | INTRAMUSCULAR | Status: DC
  Administered 2022-10-30: 3 [IU] via SUBCUTANEOUS
  Administered 2022-10-31 – 2022-11-01 (×5): 2 [IU] via SUBCUTANEOUS
  Administered 2022-11-02 – 2022-11-03 (×3): 3 [IU] via SUBCUTANEOUS
  Administered 2022-11-03: 5 [IU] via SUBCUTANEOUS
  Administered 2022-11-04: 2 [IU] via SUBCUTANEOUS
  Administered 2022-11-04 – 2022-11-05 (×2): 3 [IU] via SUBCUTANEOUS
  Administered 2022-11-05: 5 [IU] via SUBCUTANEOUS
  Administered 2022-11-06: 2 [IU] via SUBCUTANEOUS
  Administered 2022-11-06: 3 [IU] via SUBCUTANEOUS
  Administered 2022-11-06 – 2022-11-07 (×2): 2 [IU] via SUBCUTANEOUS
  Administered 2022-11-07: 5 [IU] via SUBCUTANEOUS

## 2022-10-30 MED ORDER — GUAIFENESIN-DM 100-10 MG/5ML PO SYRP
5.0000 mL | ORAL_SOLUTION | ORAL | Status: DC | PRN
  Administered 2022-10-31 – 2022-11-07 (×7): 5 mL via ORAL
  Filled 2022-10-30 (×8): qty 5

## 2022-10-30 MED ORDER — FUROSEMIDE 40 MG PO TABS: 40.0000 mg | ORAL_TABLET | Freq: Every day | ORAL | Status: DC

## 2022-10-30 NOTE — Assessment & Plan Note (Deleted)
Had NSTEMI vs stress cardiomyopathy in May, treated medically.

## 2022-10-30 NOTE — Progress Notes (Signed)
Heart Failure Nurse Navigator Progress Note  PCP: Rebekah Chesterfield, NP PCP-Cardiologist: None  Admission Diagnosis: Acute on chronic congestive heart failure.  Admitted from: Home via EMS  Presentation:   Jessica Torres presented with cough 2-3 days, has been to 6 different hospitals in the last year and feels like nobody is helping her. Has been feeling shortness of breath and 2+ BLE edema. BNP 3,983. IV lasix given. CXR with significant vascular congestion and possibly opacification right lung base.   Patient , her son, and sister, were all educated on the sign and symptoms of heart failure, daily weights, when to call her doctor or go to the ED, diet/ fluid restrictions, taking all medications as prescribed and attending all medical appointments. Patient and family verbalized their understanding, a HF TOC appointment was scheduled for 11/11/2022 @ 12 noon.   ECHO/ LVEF: 40-45%  Clinical Course:  Past Medical History:  Diagnosis Date   Arthritis    "fingers probably" (10/30/2016)   Bulging lumbar disc    Chronic lower back pain    Difficulty urinating    GERD (gastroesophageal reflux disease)    Heart murmur    hx   Hyperlipidemia    statin intolerant   Hypertension    Hypothyroidism    PAD (peripheral artery disease) (HCC)    Sleep apnea    "dx'd years ago; had severe problems; all of the sudden it just went away" (07/06/2014) *still gone" (10/30/2016)   Stroke [facility]) ~ 2010   denies residual on 10/30/2016   Type 2 diabetes mellitus (HCC)    Urgency of urination    Uterine cancer (HCC)      Social History   Socioeconomic History   Marital status: Divorced    Spouse name: Not on file   Number of children: 4   Years of education: Not on file   Highest education level: Not on file  Occupational History   Occupation: retired  Tobacco Use   Smoking status: Never   Smokeless tobacco: Never  Vaping Use   Vaping status: Never Used  Substance and Sexual Activity   Alcohol  use: Yes    Alcohol/week: 0.0 standard drinks of alcohol    Comment: 10/30/2016  "margarita q  2 wks or so"   Drug use: No   Sexual activity: Never  Other Topics Concern   Not on file  Social History Narrative   2 sons and their sons are living with her right now   Another son lives next door and daughter lives in [place]   Social Determinants of Health   Financial Resource Strain: Low Risk  (09/25/2020)   Overall Financial Resource Strain (CARDIA)    Difficulty of Paying Living Expenses: Not hard at all  Food Insecurity: No Food Insecurity (10/29/2022)   Hunger Vital Sign    Worried About Running Out of Food in the Last Year: Never true    Ran Out of Food in the Last Year: Never true  Transportation Needs: No Transportation Needs (10/29/2022)   PRAPARE - Administrator, Civil Service (Medical): No    Lack of Transportation (Non-Medical): No  Physical Activity: Inactive (09/25/2020)   Exercise Vital Sign    Days of Exercise per Week: 0 days    Minutes of Exercise per Session: 0 min  Stress: No Stress Concern Present (10/12/2022)   Received from [facility] - Occupational Stress Questionnaire    Feeling of  Stress : Only a little  Social Connections: Unknown (08/27/2022)   Received from [facility]   Social Network    Social Network: Not on file  Education Assessment and Provision:  Detailed education and instructions provided on heart failure disease management including the following:  Signs and symptoms of Heart Failure When to call the physician Importance of daily weights Low sodium diet Fluid restriction Medication management Anticipated future follow-up appointments  Patient education given on each of the above topics.  Patient acknowledges understanding via teach back method and acceptance of all instructions.  Education Materials:  "Living Better With Heart Failure" Booklet, HF zone tool, & Daily Weight  Tracker Tool.  Patient has scale at home: Yes Patient has pill box at home: Yes    High Risk Criteria for Readmission and/or Poor Patient Outcomes: Heart failure hospital admissions (last 6 months): 1  No Show rate: 14% Difficult social situation: No, will be staying with her son while she recovers.  Demonstrates medication adherence: yes Primary Language: English Literacy level: Reading, writing, and comprehension  Barriers of Care:   HF education continued Diet/ fluid / daily weights  Considerations/Referrals:   Referral made to Heart Failure Pharmacist Stewardship: Yes Referral made to Heart Failure CSW/NCM TOC: No Referral made to Heart & Vascular TOC clinic: Yes, 11/11/2022 @ 12 noon.   Items for Follow-up on DC/TOC: Continued HF education Diet/ fluids/ daily weights   Rhae Hammock, BSN, RN Heart Failure Print production planner Chat Only

## 2022-10-30 NOTE — Assessment & Plan Note (Deleted)
Mild asymptomatic. - Continue diuretics - Daily BMP

## 2022-10-30 NOTE — Assessment & Plan Note (Deleted)
CT chest shows no ILD.  Does show large effusion.  No lung mass, no hiatal hernia.   - Obtain thoracentesis

## 2022-10-30 NOTE — Plan of Care (Signed)
?  Problem: Clinical Measurements: ?Goal: Ability to maintain clinical measurements within normal limits will improve ?Outcome: Progressing ?Goal: Will remain free from infection ?Outcome: Progressing ?Goal: Diagnostic test results will improve ?Outcome: Progressing ?  ?

## 2022-10-30 NOTE — Progress Notes (Signed)
  Progress Note   [name] LKG:[id number] DOB: 1940-12-12 DOA: 10/29/2022     1 DOS: the patient was seen and examined on 10/30/2022 at 10:08AM      Brief hospital course: Mrs. Jessica Torres is a 82 y.o. F with CAD, PVD, DM, CKD IIIb baseline 1.3-1.4, cirrhosis c/b GAVE, hypothyroidism and AoCKD who presented with cough, orthopnea and dyspnea on exertion.  In the ER, CXR with effusions, edema and BNP >2000     Assessment and Plan: * Acute on chronic combined systolic and diastolic CHF (congestive heart failure) (HCC) EF 40-45%, moderate MR.  Had been recently admitted to [facility] and seen by Cardiology there.  Discharge medications are unclear [place] and [place]? 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 Up to 2.0 today in setting of diuresis.  I think her baseline on diuretics may be higher than previously. - Daily BMP   DM (diabetes mellitus) (HCC) A1c 6.4, glucose here controlled - Continue SS corrections   Chronic cough I suspect this is multifactorial.  No mention of hiatal hernia on EGD last Feb.  Maybe some GERD or PND or ILD?   - Recommend Pulm eval as outpatient  Hyponatremia Mild asymptomatic. - Continue diuretics - Daily BMP  Cirrhosis of liver without ascites (HCC) Based on imaging and presence of gastric ectasia on EGD.  Compensated, no signs of liver dysfunction.  GAVE (gastric antral vascular ectasia) - Continue PPI  History of CVA (cerebrovascular accident) - Continue aspirin, Zetia, fibrate  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 disease Hgb stably low, in setting of renal failure.  Ferritin and iron sat normal 3 weeks  ago. - May need to stop aspirin given anemia  Hypothyroidism - Continue levothyroxine  Coronary artery disease due to lipid rich plaque    Peripheral arterial disease - Bilateral SFA  - Continue aspirin, Zetia, fenofibrate  Hyperlipidemia-statin intolerant - Continue Zetia and fenofibrate          Subjective: Still with nonproductive cough.  Overall better.  Breathing slightly better.  No fever, no confusion, no respiratory distress.     Physical Exam: BP (!) 104/49 (BP Location: Left Arm)   Pulse 74   Temp 98.4 F (36.9 C) (Oral)   Resp (!) 21   Ht 5\' 5"  (1.651 m)   Wt 57 kg   SpO2 96%   BMI 20.91 kg/m   Elderly adult female, lying in bed, tired, NAD RRR no murmurs, no LE edema. Respiratory rate shallow, crackles in bilateral bases, no wheezing Abdomen soft no tenderness palpation Attention normal, affect appropriate, judgment and insight appear normal    Data Reviewed: Procalcitonin 0.13 COVID-negative Basic metabolic panel shows sodium down to 131, creatinine up to 2.1 CBC shows hemoglobin down to 7.7 BNP shows 3983 White blood cell count normal    Family Communication: Sister and son at the bedside    Disposition: Status is: Inpatient         Author: Alberteen Sam, MD 10/30/2022 4:48 PM  For on call review [email or web address].

## 2022-10-30 NOTE — Assessment & Plan Note (Deleted)
Based on imaging and presence of gastric ectasia on EGD.  Compensated, no signs of liver dysfunction.

## 2022-10-30 NOTE — Hospital Course (Addendum)
Jessica Torres was admitted to the hospital with the working diagnosis of heart failure exacerbation.    82 y.o. 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  In the ER, CXR with effusions, edema and BNP >2000   8/21: Admitted on IV Lasix 8/22: Improving and off O2, still symptomatic 8/23: Breathing worse, cough worse, now requiring 1-2L O2; Cardiology consulted; CT chest showed large effusion 08/24 IV furosemide with IV albumin.  08/25 improving volume status, echocardiogram with reduction in LV systolic function.  08/26 continue with hypervolemia.

## 2022-10-30 NOTE — Assessment & Plan Note (Deleted)
Hgb trending down, in setting of renal failure, known GAVE on aspirin.  Ferritin and iron sat normal 3 weeks ago. - May need to stop aspirin given anemia

## 2022-10-30 NOTE — Progress Notes (Signed)
Mobility Specialist Progress Note:    10/30/22 1502  Mobility  Activity Ambulated with assistance in hallway  Level of Assistance Minimal assist, patient does 75% or more  Assistive Device Front wheel walker  Distance Ambulated (ft) 150 ft  Activity Response Tolerated fair  Mobility Referral Yes  $Mobility charge 1 Mobility  Mobility Specialist Start Time (ACUTE ONLY) 1215  Mobility Specialist Stop Time (ACUTE ONLY) 1229  Mobility Specialist Time Calculation (min) (ACUTE ONLY) 14 min   Pt received in bed, agreeable to ambulate. Pt required MinA w/ bed mobility and STS. During ambulation pt c/o bilat leg pain and overall fatigue. Upon returning to room pt requested to use the BR. Demonstrated how to use the call bell when finished. RN notified.   Thompson Grayer Mobility Specialist  Please contact vis Secure Chat or  Rehab Office [phone]

## 2022-10-30 NOTE — Assessment & Plan Note (Deleted)
-   Continue aspirin, Zetia, fenofibrate

## 2022-10-30 NOTE — Plan of Care (Signed)

## 2022-10-30 NOTE — Progress Notes (Signed)
   Heart Failure Stewardship Pharmacist Progress Note   PCP: Rebekah Chesterfield, NP PCP-Cardiologist: Dietrich Pates, MD    HPI:  82 yo F with PMH of CHF, CAD, PAD, GAVE, T2DM, hypothyroidism, CKD IIIb, and anemia.   She was recently hospitalized at [facility] for acute on chronic systolic CHF. EF 40-45%. Difficulty with diuresis with hypotension. Her metoprolol was changed to carvedilol. However, at follow up with cardiology, carvedilol was switched back to metoprolol XL.   She presented to the ED on 8/21 with continued shortness of breath, LE edema, orthopnea, and cough. CXR with worsening bilateral pleural effusions with opacities. BNP 3983.   Current HF Medications: Diuretic: furosemide 40 mg IV daily Beta Blocker: metoprolol XL 25 mg daily  Prior to admission HF Medications: Diuretic: furosemide 20 mg daily Beta blocker: carvedilol 3.125 mg BID AND metoprolol XL 25 mg daily on profile SGLT2i: Farxiga 10 mg daily AND Jardiance 10 mg daily on profile  Pertinent Lab Values: Serum creatinine 2.10, BUN 30, Potassium 4.0, Sodium 131, BNP 3983.8  Vital Signs: Weight: 125 lbs (admission weight: 125 lbs) Blood pressure: 100/50s  Heart rate: 70-80s  I/O: net -0.7L yesterday; net -0.8L since admission  Medication Assistance / Insurance Benefits Check: Does the patient have prescription insurance?  Yes Type of insurance plan: [employer] Medicare  Outpatient Pharmacy:  [facility] the patient willing to use [facility] TOC pharmacy at discharge? Yes Is the patient willing to transition their outpatient pharmacy to utilize a [facility] outpatient pharmacy?   Pending    Assessment: 1. Acute on chronic systolic CHF (LVEF 40-45%). NYHA class III symptoms. - Continue furosemide 40 mg IV daily. Strict I/Os and daily weights. Keep K>4 and Mg>2. - Continue metoprolol XL 25 mg daily (note carvedilol discontinued by [facility] cardiology at follow up) - BP soft, hold on adding  ARB or MRA to allow high enough BP for diuresis - Consider restarting Farxiga 10 mg daily tomorrow if creatinine improved   Plan: 1) Medication changes recommended at this time: - Restart Farxiga 10 mg daily tomorrow if creatinine improved - Discontinue carvedilol and Jardiance from medication list at discharge  2) Patient assistance: - None pending  3)  Education  - Initial education completed - Full education to be completed prior to discharge  Sharen Hones, PharmD, BCPS Heart Failure Engineer, building services Phone [phone]

## 2022-10-31 ENCOUNTER — Inpatient Hospital Stay (HOSPITAL_COMMUNITY): Payer: Medicare HMO

## 2022-10-31 DIAGNOSIS — J90 Pleural effusion, not elsewhere classified: Secondary | ICD-10-CM | POA: Insufficient documentation

## 2022-10-31 DIAGNOSIS — I5043 Acute on chronic combined systolic (congestive) and diastolic (congestive) heart failure: Secondary | ICD-10-CM | POA: Diagnosis not present

## 2022-10-31 LAB — CBC
HCT: 23.4 % — ABNORMAL LOW (ref 36.0–46.0)
Hemoglobin: 7.5 g/dL — ABNORMAL LOW (ref 12.0–15.0)
MCH: 29.4 pg (ref 26.0–34.0)
MCHC: 32.1 g/dL (ref 30.0–36.0)
MCV: 91.8 fL (ref 80.0–100.0)
Platelets: 202 10*3/uL (ref 150–400)
RBC: 2.55 MIL/uL — ABNORMAL LOW (ref 3.87–5.11)
RDW: 20.4 % — ABNORMAL HIGH (ref 11.5–15.5)
WBC: 6.2 10*3/uL (ref 4.0–10.5)
nRBC: 0 % (ref 0.0–0.2)

## 2022-10-31 LAB — BASIC METABOLIC PANEL
Anion gap: 11 (ref 5–15)
BUN: 31 mg/dL — ABNORMAL HIGH (ref 8–23)
CO2: 23 mmol/L (ref 22–32)
Calcium: 8.6 mg/dL — ABNORMAL LOW (ref 8.9–10.3)
Chloride: 96 mmol/L — ABNORMAL LOW (ref 98–111)
Creatinine, Ser: 2.28 mg/dL — ABNORMAL HIGH (ref 0.44–1.00)
GFR, Estimated: 21 mL/min — ABNORMAL LOW (ref >60)
Glucose, Bld: 214 mg/dL — ABNORMAL HIGH (ref 70–99)
Potassium: 3.9 mmol/L (ref 3.5–5.1)
Sodium: 130 mmol/L — ABNORMAL LOW (ref 135–145)

## 2022-10-31 LAB — GLUCOSE, CAPILLARY
Glucose-Capillary: 161 mg/dL — ABNORMAL HIGH (ref 70–99)
Glucose-Capillary: 246 mg/dL — ABNORMAL HIGH (ref 70–99)
Glucose-Capillary: 196 mg/dL — ABNORMAL HIGH (ref 70–99)
Glucose-Capillary: 135 mg/dL — ABNORMAL HIGH (ref 70–99)

## 2022-10-31 NOTE — TOC Initial Note (Signed)
Transition of Care [facility]) - Initial/Assessment Note    Patient Details  Name: Jessica Torres MRN: [id number] Date of Birth: 1940-06-01  Transition of Care [facility]) CM/SW Contact:    Leone Haven, RN Phone Number: 10/31/2022, 5:08 PM  Clinical Narrative:                 From home with children, has PCP and insurance on file, states has HHRN,HHPT,HHOT with [facility] and would like to continue, she has a walker at home.  States family member will transport her  home at [employer] and family is support system, [facility] [place].  Pta walks with walker. Will need HHRN, HHPT, HHOT orders to resume.   Expected Discharge Plan: Home w Home Health Services Barriers to Discharge: Continued Medical Work up   Patient Goals and CMS Choice Patient states their goals for this hospitalization and ongoing recovery are:: return home CMS Medicare.gov Compare Post Acute Care list provided to:: Patient Choice offered to / list presented to : Patient      Expected Discharge Plan and Services In-house Referral: NA Discharge Planning Services: CM Consult Post Acute Care Choice: Home Health, Resumption of Svcs/PTA Provider Living arrangements for the past 2 months: Single Family Home                 DME Arranged: N/A         HH Arranged: RN, PT, OT HH Agency: [facility] Date HH Agency Contacted: 10/31/22 Time HH Agency Contacted: 1707 Representative spoke with at [facility] Agency: Brandi  Prior Living Arrangements/Services Living arrangements for the past 2 months: Single Family Home Lives with:: Adult Children Patient language and need for interpreter reviewed:: Yes Do you feel safe going back to the place where you live?: Yes      Need for Family Participation in Patient Care: Yes (Comment) Care giver support system in place?: Yes (comment) Current home services: DME (walker) Criminal Activity/Legal Involvement Pertinent to Current Situation/Hospitalization: No  - Comment as needed  Activities of Daily Living Home Assistive Devices/Equipment: Walker (specify type) ADL Screening (condition at time of admission) Patient's cognitive ability adequate to safely complete daily activities?: Yes Is the patient deaf or have difficulty hearing?: No Does the patient have difficulty seeing, even when wearing glasses/contacts?: No Does the patient have difficulty concentrating, remembering, or making decisions?: No Patient able to express need for assistance with ADLs?: Yes Does the patient have difficulty dressing or bathing?: No Independently performs ADLs?: Yes (appropriate for developmental age) Does the patient have difficulty walking or climbing stairs?: Yes Weakness of Legs: Both Weakness of Arms/Hands: None  Permission Sought/Granted Permission sought to share information with : Case Manager Permission granted to share information with : Yes, Verbal Permission Granted     Permission granted to share info w AGENCY: [facility]        Emotional Assessment Appearance:: Appears stated age Attitude/Demeanor/Rapport: Engaged Affect (typically observed): Appropriate Orientation: : Oriented to Self, Oriented to Place, Oriented to  Time, Oriented to Situation Alcohol / Substance Use: Not Applicable Psych Involvement: No (comment)  Admission diagnosis:  Systolic congestive heart failure (HCC) [I50.20] Acute on chronic congestive heart failure, unspecified heart failure type (HCC) [I50.9] Patient Active Problem List   Diagnosis Date Noted   Hyponatremia 10/30/2022   Acute on chronic combined systolic and diastolic CHF (congestive heart failure) (HCC) 10/29/2022   Acute kidney injury on chronic kidney disease stage IV (HCC) 10/29/2022   Symptomatic anemia  07/15/2022   Melena 07/15/2022   Cirrhosis of liver without ascites (HCC) 07/13/2022   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   Gastritis and gastroduodenitis 04/28/2022   NSTEMI  (non-ST elevated myocardial infarction) (HCC) 04/27/2022   Gastrointestinal hemorrhage 04/27/2022   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 disease 04/27/2022   Heart failure with preserved ejection fraction (HCC) 04/27/2022   Chronic cough 04/27/2022   History of CVA (cerebrovascular accident) 04/27/2022   Osteopenia 11/28/2020   Iron deficiency 10/11/2020   Stage 4 chronic kidney disease (HCC) 05/25/2020   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   Chronic bilateral low back pain without sciatica 07/13/2019   Carotid stenosis, right 12/10/2017   Occlusion and stenosis of right carotid artery 12/10/2017   Left hip pain 06/04/2017   Claudication in peripheral vascular disease (HCC) 10/30/2016   Vitamin D deficiency 05/07/2016   Gastroesophageal reflux disease without esophagitis 01/28/2016   Coronary artery disease due to lipid rich plaque 10/18/2015   Hypothyroidism 10/18/2015   S/P -Billat SFA PTA with restenosis  07/06/2014   Peripheral vascular angioplasty status 07/06/2014   Hypertension associated with diabetes (HCC) 06/28/2014   Hyperlipidemia-statin intolerant 06/28/2014   DM (diabetes mellitus) (HCC) 06/28/2014   Peripheral arterial disease - Bilateral SFA  06/28/2014   PCP:  Rebekah Chesterfield, NP Pharmacy:   [phone] [place] [place] [facility] [address] [place] [address] Phone: [phone] Fax: [phone]  [facility] [address] [place] [address] Phone: [phone] Fax: [phone]  [facility] [address] [place] [address] Phone: [phone] Fax: [phone]     Social Determinants of Health (SDOH) Social History: SDOH Screenings   Food Insecurity: No Food Insecurity (10/29/2022)  Housing: Low Risk  (10/30/2022)  Transportation Needs: No Transportation Needs (10/30/2022)  Utilities: Not At Risk (10/29/2022)  Alcohol Screen: Low Risk   (10/30/2022)  Depression (PHQ2-9): Medium Risk (02/21/2022)  Financial Resource Strain: Low Risk  (10/30/2022)  Physical Activity: Inactive (09/25/2020)  Social Connections: Unknown (08/27/2022)   Received from [facility]  Stress: No Stress Concern Present (10/12/2022)   Received from [facility]  Tobacco Use: Low Risk  (10/29/2022)   SDOH Interventions: Housing Interventions: Intervention Not Indicated Transportation Interventions: Intervention Not Indicated Alcohol Usage Interventions: Intervention Not Indicated (Score <7) Financial Strain Interventions: Intervention Not Indicated   Readmission Risk Interventions     No data to display

## 2022-10-31 NOTE — Progress Notes (Addendum)
Progress Note   [name]:[id number] DOB: [birth year] DOA: 10/29/2022     2 DOS: the patient was seen and examined on 10/31/2022 at 10:53AM      Brief hospital course: Jessica Torres is a 82 y.o. 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  In the ER, CXR with effusions, edema and BNP >2000   8/21: Admitted on IV Lasix 8/22: Improving and off O2, still symptomatic 8/23: Breathing worse, cough worse, now requiring 1-2L O2; Cardiology consulted; CT chest showed large effusion     Assessment and Plan: * Acute on chronic combined systolic and diastolic CHF (congestive heart failure) (HCC) EF 40-45%, moderate MR.  EF normal in April this year, then down to 40% by May.  GDMT limited by hypotension, renal disease.   - Continue furosemide IV - Monitor K/Cr daily - Strict I/Os - Continue metoprolol succ 25 daily - Hold [name], spiro for now - Consult Cardiology  ADDENDUM: D/w Dr. Avelino Leeds, her CT shows severe extensive calcifications.  In light of her worsening renal failure, limited functional status, and anemia, revascularization is not feasible, and she may have a prognosis as short as 6 months or less.  Cardiology agree with thoracentesis for symptom management, and then Palliative involvement likely for d/c to Hospice    Pleural effusion This is large and symptomatic.  Given Lasix is limited by hypotension, will address this with thora.  Procal/WBC low, I am not convinced this is infectious. - Obtain Thoracentesis - Check LDH  Acute kidney injury on chronic kidney disease stage IV (HCC) Cardiorenal syndrome  Creatinine of 1.89 on admission from 1.40 baseline.    Up to 2.2 again, in setting of diuresis.    - Hold furosemide - Daily BMP  DM (diabetes mellitus) (HCC) A1c 6.4, glucose here  controlled - Continue SS corrections   Chronic cough CT chest shows no ILD.  Does show large effusion.  No lung mass, no hiatal hernia.   - Obtain thoracentesis  Hyponatremia Mild asymptomatic. - Continue diuretics - Daily BMP  Cirrhosis of liver without ascites (HCC) Based on imaging and presence of gastric ectasia on EGD.  Compensated, no signs of liver dysfunction.  GAVE (gastric antral vascular ectasia) - Continue PPI  History of CVA (cerebrovascular accident) - Continue aspirin, Zetia, fibrate  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 disease Hgb trending down, in setting of renal failure, known GAVE on aspirin.  Ferritin and iron sat normal 3 weeks ago. - May need to stop aspirin given anemia  Hypothyroidism TSH normal at [facility] this month. - Continue levothyroxine  Coronary artery disease due to lipid rich plaque Had NSTEMI vs stress cardiomyopathy in May, treated medically.  Peripheral arterial disease - Bilateral SFA  - Continue aspirin, Zetia, fenofibrate  Hyperlipidemia-statin intolerant - Continue Zetia and fenofibrate          Subjective: More tired, coughing more, more OOB breath today.  Will consult Cardiology.      Physical Exam: BP 99/64 (BP Location: Left Arm)   Pulse 70   Temp 98.4 F (36.9 C) (Oral)   Resp 20   Ht 5\' 5"  (1.651 m)   Wt 58.7 kg   SpO2 98%   BMI 21.53 kg/m   Elderly adult female, lying in bed, tired, NAD RRR no murmurs, no LE edema. Respiratory rate  shallow, crackles in bilateral bases, no wheezing Abdomen soft no tenderness palpation Attention normal, affect appropriate, judgment and insight appear normal  Data Reviewed: CBC and BMP reviewed  Family Communication: Son at bedside    Disposition: Status is: Inpatient Patient admitted with cough, orthponea.  Started on diuretics but renal function worsened, symptoms and hypoxia worsened.  Will consult Cardiology.   CT shows no ILD but does shows effusion, will  order thoracentesis.        Author: Alberteen Sam, MD 10/31/2022 5:40 PM  For on call review [email or web address].

## 2022-10-31 NOTE — Assessment & Plan Note (Signed)
This is large and symptomatic.  Given Lasix is limited by hypotension, will address this with thora.  Procal/WBC low, I am not convinced this is infectious. - Obtain Thoracentesis - Check LDH

## 2022-10-31 NOTE — Plan of Care (Signed)

## 2022-10-31 NOTE — Consult Note (Addendum)
Cardiology Consultation   Patient ID: Jessica Torres MRN: [id number]; DOB: Apr 22, 1940  Admit date: 10/29/2022 Date of Consult: 10/31/2022  PCP:  Rebekah Chesterfield, NP   [facility] Providers Cardiologist:  Dietrich Pates, MD        Patient Profile:   Jessica Torres is a 82 y.o. female with a hx of PAD, right carotid artery disease,  hypertension, hyperlipidemia, GIB with GAVE/gastritis, CKD IIIb, arthritis, GERD, hypothyroidism, CVA, OSA, DM, cirrhosis who is being seen 10/31/2022 for the evaluation of acute congestive heart failure at the request of Dr. Maryfrances Bunnell.  History of Present Illness:   Jessica Torres presented to the emergency department on 8/21 for evaluation and management of ongoing shortness of breath and cough.   Patient was just hospitalized at [facility] at the end of July for similar symptoms.  At the time of that admission, patient described progressive dyspnea worse at night, and need to elevate her head with pillows in order to sleep.  This was also associated with increased lower extremity edema and weight gain.  During her admission, patient received IV diuresis, but did struggle with intermittent hypotension.  Echocardiogram during hospitalization demonstrated mild LV dysfunction with ejection fraction of 40 to 45% with anteroapical hypokinesis and mild to moderate mitral valve regurgitation.  At the time of discharge, patient was taking Jardiance, oral Lasix, low-dose carvedilol.  One day following discharge, patient was seen by [facility] health outpatient cardiology.  Per this office visit note, plans made to complete ischemic evaluation with nuclear stress testing and attempt to manage patient's condition medically given comorbidities that would make coronary angiography risky.  At this visit, patient's goal-directed medical therapy was modified slightly.  She was taken off of carvedilol and put back on metoprolol succinate 25 mg (this had previously been  discontinued during her hospitalization).  ACE/ARB/ARNI was not initiated, and per notes it appears that her losartan-hydrochlorothiazide was discontinued.  She was continued on Jardiance, Lasix 20mg .  Patient completed her stress test on 10/24/2022.  "The overall quality of the study was suboptimal but interpretable.  Global hypokinesis with apical akinesis.  There was no convincing reversibility.  There was a medium sized, moderate in intensity, fixed defect involving the anteroseptal and apical walls.  No convincing evidence of inducible ischemia.  Medium sized fixed defect of the apex with associated akinesis consistent with prior myocardial infarction."   In the ED, patient home with a BNP of 3983.  Chest x-ray with bilateral pleural effusions with opacity.  She was initiated on IV Lasix 40 mg but to this point in her admission, and is only net -344 mL per chart records.  Creatinine has risen to 2.0 from 1.4 at admission.  Per patient, her symptoms are essentially unchanged from prior to above admission. She advises of feeling poorly as far back as last Christmas, though says that certain symptoms such as orthopnea and lower extremity edema have worsened over the last few months. She still has consistent nocturnal dyspnea and exertional shortness of breath. She also continues with a chronic cough that seems to be triggered by deep inspiration. Additionally has continued to have swelling in lower extremities. She even increased Lasix to 40 mg a day without significant change. Patient currently resides with her son but prior to recent admission was living in a house with her grandson. Apparently he works third shift and for the past 4 years, patient has been primarily bed bound during the day because she is  afraid of waking him up. Patient's son in the room today says that he has continually told his mom that this should not be a concern and encouraged her to be more active, but patient still is spending most  days in bed.   Patient does not have a history of smoking or alcohol consumption. 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    Pertinent history this year:  Jessica Torres remotely followed with Dr. Allyson Sabal for h/o PAD with prior bilateral LE interventions with procedures back in 2015, 2016, and 2018. She was previously referred to vascular surgery for consideration of left fem-pop but felt to be poor candidate due to lack of venous conduit. Last angiography was in 2018 with angioplasty of the right SFA CTO. She had been maintained on Plavix, also placed on Pletal in 2022. She was last advised to f/u 3 months but 07/2020 was last OV. She does report she was sen by a different doctor, unsure where, who asked her if she'd ever been told she had a heart murmur. She denies ever having a formal cardiac diagnosis.   She was admitted 04/2022 for ABL anemia requiring 3 U PRBCs, with gastritis/GAVE and APC treatment. Plavix resumed at discharge and she was discharged on PPI. She was found to have elevated troponin of 457 with stable echo, recommended for OP f/u. She [facility] to [facility] with abnormal labwork after PCP found Hgb to be again severely low, checked due to weakness, fatigue, chest pain, and upper abdominal pain. She'd also had melena. Initial Hgb was 6.1. She reported for the last few weeks she'd felt progressively weaker and ice cold. She also noticed intermittent chest pain down her arms worse with walking, improved with sitting on the side of the bed taking deep breaths or using a heating pad. No syncope or palpitations. Labs also have shown mild hyponatremia, INR 1.4. 13 troponins were trended at [phone]. Ultimately received tranfusions and had resolution of chest pain. She was discharged on ASA only. That admission also found to have LVEF down to 40-45%. GDMT limited by chronic illness.  Past Medical  History:  Diagnosis Date   Arthritis    "fingers probably" (10/30/2016)   Bulging lumbar disc    Chronic lower back pain    Difficulty urinating    GERD (gastroesophageal reflux disease)    Heart murmur    hx   Hyperlipidemia    statin intolerant   Hypertension    Hypothyroidism    PAD (peripheral artery disease) (HCC)    Sleep apnea    "dx'd years ago; had severe problems; all of the sudden it just went away" (07/06/2014) *still gone" (10/30/2016)   Stroke [facility]) ~ 2010   denies residual on 10/30/2016   Type 2 diabetes mellitus (HCC)    Urgency of urination    Uterine cancer [facility])     Past Surgical History:  Procedure Laterality Date   ANGIOPLASTY Right 01/22/2015   w/ atherectomy R sfa   BALLOON ANGIOPLASTY, ARTERY Right    SFA/notes 07/06/2014   BIOPSY  04/28/2022   Procedure: BIOPSY;  Surgeon: Tressia Danas, MD;  Location: [facility] ENDOSCOPY;  Service: Gastroenterology;;   BREAST EXCISIONAL BIOPSY Left    BREAST SURGERY     CARPAL TUNNEL RELEASE Right 1980's   COLONOSCOPY WITH PROPOFOL N/A 11/26/2020   Procedure: COLONOSCOPY WITH PROPOFOL;  Surgeon: Lanelle Bal, DO;  Location: [facility] ENDO [facility];  Service: Endoscopy;  Laterality: N/A;  9:00am, pt cannot come in earlier   DILATION AND CURETTAGE OF UTERUS     ESOPHAGOGASTRODUODENOSCOPY N/A 04/28/2022   Procedure: ESOPHAGOGASTRODUODENOSCOPY (EGD);  Surgeon: Tressia Danas, MD;  Location: [facility] ENDOSCOPY;  Service: Gastroenterology;  Laterality: N/A;   ESOPHAGOGASTRODUODENOSCOPY (EGD) WITH PROPOFOL N/A 04/30/2022   Procedure: ESOPHAGOGASTRODUODENOSCOPY (EGD) WITH PROPOFOL;  Surgeon: Tressia Danas, MD;  Location: [facility] ENDOSCOPY;  Service: Gastroenterology;  Laterality: N/A;  With APC   ESOPHAGOGASTRODUODENOSCOPY (EGD) WITH PROPOFOL N/A 07/15/2022   Procedure: ESOPHAGOGASTRODUODENOSCOPY (EGD) WITH PROPOFOL;  Surgeon: Dolores Frame, MD;  Location: [facility] ENDO [facility];  Service: Gastroenterology;  Laterality: N/A;   HEMORRHOID  SURGERY     "dr lanced it; in dr's office"   HOT HEMOSTASIS N/A 04/30/2022   Procedure: HOT HEMOSTASIS (ARGON PLASMA COAGULATION/BICAP);  Surgeon: Tressia Danas, MD;  Location: [facility] ENDOSCOPY;  Service: Gastroenterology;  Laterality: N/A;   HOT HEMOSTASIS  07/15/2022   Procedure: HOT HEMOSTASIS (ARGON PLASMA COAGULATION/BICAP);  Surgeon: Marguerita Merles, Reuel Boom, MD;  Location: [facility] ENDO [facility];  Service: Gastroenterology;;   INCISION AND DRAINAGE BREAST ABSCESS Left    LOWER EXTREMITY ANGIOGRAM N/A 07/06/2014   Procedure: LOWER EXTREMITY ANGIOGRAM & R-SFA atherectomy and balloon angioplasty;  Surgeon: Runell Gess, MD;  Location: [facility] CATH LAB;  Service: Cardiovascular;  Laterality: R   LOWER EXTREMITY ANGIOGRAM  10/30/2016   LOWER EXTREMITY ANGIOGRAPHY N/A 10/30/2016   Procedure: Lower Extremity Angiography;  Surgeon: Runell Gess, MD;  Location: [facility] INVASIVE CV LAB;  Service: Cardiovascular;  Laterality: N/A;   PERIPHERAL VASCULAR CATHETERIZATION N/A 08/17/2014   Procedure: Lower Extremity Angiography;  Surgeon: Runell Gess, MD;  Location: [facility] INVASIVE CV LAB;  Service: Cardiovascular;  Laterality: N/A;   PERIPHERAL VASCULAR CATHETERIZATION Left 08/17/2014   Procedure: Peripheral Vascular Atherectomy and Viabahn stent L-SFA;  Surgeon: Runell Gess, MD; Laterality: Left; L-SFA   PERIPHERAL VASCULAR CATHETERIZATION N/A 01/11/2015   Procedure: Lower Extremity Angiography;  Surgeon: Runell Gess, MD;  Location: [facility] INVASIVE CV LAB;  Service: Cardiovascular;  Laterality: N/A;   PERIPHERAL VASCULAR CATHETERIZATION Right 01/22/2015   Procedure: Peripheral Vascular Atherectomy;  Surgeon: Runell Gess, MD;  Location: [facility] INVASIVE CV LAB;  Service: Cardiovascular;  Laterality: Right;   POLYPECTOMY  11/26/2020   Procedure: POLYPECTOMY;  Surgeon: Lanelle Bal, DO;  Location[facility] ENDO [facility];  Service: Endoscopy;;   POLYPECTOMY  07/15/2022   Procedure: POLYPECTOMY INTESTINAL;  Surgeon:  Dolores Frame, MD;  Location: [facility] ENDO [facility];  Service: Gastroenterology;;   TUBAL LIGATION     VAGINAL HYSTERECTOMY       Home Medications:  Prior to Admission medications   Medication Sig Start Date End Date Taking? Authorizing Provider  aspirin 81 MG chewable tablet Chew 1 tablet (81 mg total) by mouth daily. 07/18/22  Yes Tyrone Nine, MD  carvedilol (COREG) 3.125 MG tablet Take 3.125 mg by mouth 2 (two) times daily. 10/13/22  Yes [provider]  dapagliflozin propanediol (FARXIGA) 10 MG TABS tablet Take 1 tablet (10 mg total) by mouth daily. 07/18/22  Yes Tyrone Nine, MD  empagliflozin (JARDIANCE) 25 MG TABS tablet Take 25 mg by mouth daily.   Yes [provider]  ezetimibe (ZETIA) 10 MG tablet Take 1 tablet (10 mg total) by mouth daily. 08/22/21  Yes Jannifer Rodney A, FNP  fenofibrate 160 MG tablet TAKE 1 TABLET DAILY 04/02/22  Yes Hawks, Christy A, FNP  furosemide (LASIX) 20 MG tablet Take 20 mg  by mouth daily.   Yes [provider]  insulin glargine, 2 Unit Dial, (TOUJEO MAX SOLOSTAR) 300 UNIT/ML Solostar Pen INJECT 52 UNITS UNDER THE SKIN DAILY 03/18/22  Yes Hawks, Christy A, FNP  insulin lispro (HUMALOG) 100 UNIT/ML KwikPen Inject 10-20 Units into the skin 3 (three) times daily. 08/22/21  Yes Hawks, Christy A, FNP  levothyroxine (SYNTHROID) 88 MCG tablet TAKE 1 TABLET DAILY BEFORE BREAKFAST Patient taking differently: Take 88 mcg by mouth daily before breakfast. 04/24/22  Yes Hawks, Christy A, FNP  metoprolol succinate (TOPROL-XL) 25 MG 24 hr tablet Take 25 mg by mouth daily. 10/14/22  Yes [provider]  pantoprazole (PROTONIX) 40 MG tablet Take 1 tablet (40 mg total) by mouth 2 (two) times daily. Follow up with gastroenterology for additional recommendations 07/17/22  Yes Tyrone Nine, MD  Accu-Chek Softclix Lancets lancets Test BS TID Dx E11.9 12/02/21   Junie Spencer, FNP  BD PEN NEEDLE NANO 2ND GEN 32G X 4 MM MISC Use twice daily with  insulin Dx E11.9 11/25/21   Jannifer Rodney A, FNP  glucose blood (FREESTYLE LITE) test strip Test BS TID Dx E11.9 11/25/21   Junie Spencer, FNP  metFORMIN (GLUCOPHAGE) 1000 MG tablet Take 1 tablet (1,000 mg total) by mouth 2 (two) times daily with a meal. Patient taking differently: Take 1,000 mg by mouth 2 (two) times daily with a meal. Only taking once a day 04/06/19 07/13/19  Remus Loffler, PA-C    Inpatient Medications: Scheduled Meds:  aspirin  81 mg Oral Daily   docusate sodium  100 mg Oral Daily   enoxaparin (LOVENOX) injection  30 mg Subcutaneous Q24H   ezetimibe  10 mg Oral Daily   fenofibrate  160 mg Oral Daily   insulin aspart  0-9 Units Subcutaneous TID WC   levothyroxine  88 mcg Oral QAC breakfast   metoprolol succinate  25 mg Oral Daily   pantoprazole  40 mg Oral BID   polyethylene glycol  17 g Oral Daily   Continuous Infusions:  PRN Meds: guaiFENesin-dextromethorphan, ondansetron (ZOFRAN) IV, mouth rinse  Allergies:    Allergies  Allergen Reactions   Morphine And Codeine Swelling and Other (See Comments)    Facial swelling   Latex Rash   Statins Other (See Comments)    Myalgias     Social History:   Social History   Socioeconomic History   Marital status: Divorced    Spouse name: Not on file   Number of children: 5   Years of education: Not on file   Highest education level: High school graduate  Occupational History   Occupation: retired  Tobacco Use   Smoking status: Never   Smokeless tobacco: Never  Vaping Use   Vaping status: Never Used  Substance and Sexual Activity   Alcohol use: Not Currently   Drug use: No   Sexual activity: Never  Other Topics Concern   Not on file  Social History Narrative   2 sons and their sons are living with her right now   Another son lives next door and daughter lives in [place]   Social Determinants of Health   Financial Resource Strain: Low Risk  (10/30/2022)   Overall Financial Resource Strain  (CARDIA)    Difficulty of Paying Living Expenses: Not hard at all  Food Insecurity: No Food Insecurity (10/29/2022)   Hunger Vital Sign    Worried About Running Out of Food in the Last Year: Never true  Ran Out of Food in the Last Year: Never true  Transportation Needs: No Transportation Needs (10/30/2022)   PRAPARE - Administrator, Civil Service (Medical): No    Lack of Transportation (Non-Medical): No  Physical Activity: Inactive (09/25/2020)   Exercise Vital Sign    Days of Exercise per Week: 0 days    Minutes of Exercise per Session: 0 min  Stress: No Stress Concern Present (10/12/2022)   Received from [facility] - Occupational Stress Questionnaire    Feeling of Stress : Only a little  Social Connections: Unknown (08/27/2022)   Received from [facility]   Social Network    Social Network: Not on file  Intimate Partner Violence: Not At Risk (10/29/2022)   Humiliation, Afraid, Rape, and Kick questionnaire    Fear of Current or Ex-Partner: No    Emotionally Abused: No    Physically Abused: No    Sexually Abused: No    Family History:    Family History  Problem Relation Age of Onset   Heart attack Mother    Heart disease Mother    Arrhythmia Mother        has PPM   Heart disease Father    Diabetes Sister    Breast cancer Neg Hx      ROS:  Please see the history of present illness.   All other ROS reviewed and negative.     Physical Exam/Data:   Vitals:   10/31/22 0907 10/31/22 1400 10/31/22 1431 10/31/22 1652  BP: (!) 100/45  (!) 108/54 99/64  Pulse: 80  70 70  Resp: 18 20 19 20   Temp:   98.5 F (36.9 C) 98.4 F (36.9 C)  TempSrc:   Oral Oral  SpO2: 92%  98%   Weight:      Height:        Intake/Output Summary (Last 24 hours) at 10/31/2022 1703 Last data filed at 10/31/2022 1320 Gross per 24 hour  Intake 986 ml  Output 100 ml  Net 886 ml      10/31/2022    3:06 AM 10/30/2022    4:17 AM 10/29/2022     7:39 PM  Last 3 Weights  Weight (lbs) 129 lb 6.6 oz 125 lb 10.6 oz 125 lb 10.6 oz  Weight (kg) 58.7 kg 57 kg 57 kg     Body mass index is 21.53 kg/m.  General:  Frail/chronically ill appearing patient HEENT: normal Neck: JVP 3/4 to mandible with HOB at 30 degrees Vascular: No carotid bruits; Distal pulses 2+ bilaterally Cardiac:  normal S1, S2; RRR; no murmur Lungs: poor inspiratory effort with bibasilar crackles. Bases diminished R>L.  Abd: soft, nontender, no hepatomegaly  Ext: no edema Musculoskeletal:  No deformities, BUE and BLE strength normal and equal Skin: warm and dry  Neuro:  CNs 2-12 intact, no focal abnormalities noted Psych:  Normal affect   EKG:  The EKG was personally reviewed and demonstrates: Sinus rhythm with stable appearing inferior and lateral ST segment depression. Telemetry:  Telemetry was personally reviewed and demonstrates:  sinus rhythm  Relevant CV Studies:  10/24/22 Nuclear stress test ([facility])  Resting EKG: Sinus rhythm with diffuse ST depression/T wave normality. ECG during pharmacological stressor: Unchanged   Findings:  The overall quality of the study is suboptimal but interpretable.  There is noncardiac (gut) tracer uptake noted  Left ventricular cavity is noted to be upper normal limits in size  Gated SPECT imaging  reveals global hypokinesis with apical akinesis.  Calculated ejection fraction 32%  SPECT images demonstrate noncardiac (gut) tracer uptake which interferes with complete evaluation of the inferior and apical wall.  There is no convincing reversibility.  There is a medium sized, moderate in intensity, fixed defect involving the anteroseptal and apical walls.  Impression 1.  Technically difficult study with noncardiac (gut) tracer uptake noted.  There is no convincing evidence of inducible ischemia by myocardial perfusion imaging.  Medium sized fixed defect at the apex with associated akinesis consistent with prior  myocardial infarction. 2.  Severely reduced left ventricular systolic function 3.  Nondiagnostic ECG portion of the Lexiscan Cardiolite stress test due to baseline ECG abnormalities.   10/09/22 Echocardiogram Complete W Enhancing Agent ([facility])  Final Result  Left Ventricle: Systolic function is mild to moderately abnormal. EF:  40-45%. Quantitative analysis of left ventricular Global Longitudinal  Strain (GLS) imaging is -8.800%, which is abnormal.  Left Ventricle: Anteroapical hypokinesis.  Right Ventricle: Right ventricle size is normal.  Mitral Valve: There is mild to moderate regurgitation.   Laboratory Data:  High Sensitivity Troponin:  No results for input(s): "TROPONINIHS" in the last 720 hours.   Chemistry Recent Labs  Lab 10/29/22 1222 10/30/22 0149 10/31/22 0442  NA 135 131* 130*  K 3.6 4.0 3.9  CL 101 99 96*  CO2 24 20* 23  GLUCOSE 168* 267* 214*  BUN 34* 30* 31*  CREATININE 1.89* 2.10* 2.28*  CALCIUM 9.4 8.5* 8.6*  GFRNONAA 27* 24* 21*  ANIONGAP 10 12 11     No results for input(s): "PROT", "ALBUMIN", "AST", "ALT", "ALKPHOS", "BILITOT" in the last 168 hours. Lipids No results for input(s): "CHOL", "TRIG", "HDL", "LABVLDL", "LDLCALC", "CHOLHDL" in the last 168 hours.  Hematology Recent Labs  Lab 10/29/22 1222 10/30/22 0149 10/31/22 0442  WBC 6.8 6.7 6.2  RBC 2.71* 2.61* 2.55*  HGB 8.1* 7.7* 7.5*  HCT 24.7* 23.4* 23.4*  MCV 91.1 89.7 91.8  MCH 29.9 29.5 29.4  MCHC 32.8 32.9 32.1  RDW 21.4* 20.8* 20.4*  PLT 207 209 202   Thyroid No results for input(s): "TSH", "FREET4" in the last 168 hours.  BNP Recent Labs  Lab 10/29/22 1222  BNP 3,983.8*    DDimer No results for input(s): "DDIMER" in the last 168 hours.   Radiology/Studies:  CT CHEST WO CONTRAST  Result Date: 10/31/2022 CLINICAL DATA:  Diffuse/interstitial lung disease EXAM: CT CHEST WITHOUT CONTRAST TECHNIQUE: Multidetector CT imaging of the chest was performed following the standard  protocol without IV contrast. RADIATION DOSE REDUCTION: This exam was performed according to the departmental dose-optimization program which includes automated exposure control, adjustment of the mA and/or kV according to patient size and/or use of iterative reconstruction technique. COMPARISON:  Chest radiograph 10/29/2022 FINDINGS: Cardiovascular: Aortic atherosclerosis without aneurysm. The heart is enlarged. There are coronary artery calcifications. No pericardial effusion. Decreased density of the blood pool suggests anemia. Mediastinum/Nodes: Shotty mediastinal nodes including an 11 mm anterior paratracheal node. Hilar assessment is limited in the absence of IV contrast. Esophagus is grossly normal, partially obscured by adjacent pleural effusions. No thyroid nodule. Lungs/Pleura: Moderate to large right and moderate left pleural effusion. Assessment of lung parenchyma and interstitial lung disease is limited in the setting of pleural effusions and motion artifact. Subtotal atelectasis and opacification of both lower lobes, compressive atelectasis within the dependent right upper lobe. Additional ground-glass opacities in both lower lobes in the periphery of the right upper lobe. No bronchiectasis. Trachea and central airways  are clear. Upper Abdomen: Nodular hepatic contour suggestive of cirrhosis. No acute upper abdominal findings. Musculoskeletal: There are no acute or suspicious osseous abnormalities. No chest wall soft tissue abnormalities. IMPRESSION: 1. Moderate to large right and moderate left pleural effusion. Subtotal atelectasis and opacification of both lower lobes, compressive atelectasis within the dependent right upper lobe. Additional ground-glass opacities in both lower lobes in the periphery of the right upper lobe. Findings may be combination of compressive atelectasis and pulmonary edema or infection. 2. Cardiomegaly with coronary artery calcifications. 3. Nodular hepatic contour  suggestive of cirrhosis. 4. Shotty mediastinal nodes are likely reactive. Aortic Atherosclerosis (ICD10-I70.0). Electronically Signed   By: Narda Rutherford M.D.   On: 10/31/2022 16:14   DG Chest 2 View  Result Date: 10/29/2022 CLINICAL DATA:  Cough and shortness of breath EXAM: CHEST - 2 VIEW COMPARISON:  X-ray 04/27/2022 FINDINGS: Underinflation. Bilateral pleural effusions and adjacent opacities. No pneumothorax. Vascular congestion. Mild interstitial prominence. Component of edema is not excluded. Recommend follow-up. Overlapping cardiac leads IMPRESSION: Developing bilateral pleural effusions with the adjacent opacities and interstitial changes. Recommend follow-up Electronically Signed   By: Karen Kays M.D.   On: 10/29/2022 13:56     Assessment and Plan:   Acute on chronic systolic heart failure  Patient admitted 8/22 for management of recurrent heart failure symptoms including nocturnal dyspnea, exertional intolerance, lower extremity edema. [facility]. At time of admission, patient taking Lasix 40 mg p.o. daily, metoprolol succinate 25 mg, Jardiance 25mg .  She did see [facility] cardiology on 8/6 but initiation of ACE/ARB/ARNI deferred.  Recent outpatient nuclear stress test with no convincing evidence of inducible ischemia.  Medium sized fixed defect of the apex with associated akinesis consistent with prior myocardial infarction.  Most recent echocardiogram shows LVEF of 40 to 45% with anterolateral hypokinesis. Prior echo in May of this year also found LVEF 40-45% with similar apical septal akinesis. LVEF 60-65% 02/24.   BNP this admission 3983.9.  Chest x-ray with bilateral pleural effusions and adjacent opacities. Patient status post IV Lasix 40 mg once 8/21 and again on 8/22.  Net output reported only as 344 mL. Lasix held today with creatinine rising 1.89->2.10->2.28. Patient with improvement in peripheral edema  following diuresis but still has physical exam and imaging evidence of vascular congestion/volume overload.  Overall challenging situation. Patient is very frail following several years spent primarily in bed. While she has never undergone coronary angiography, her recent stress test is consistent with prior infarct. Additionally CT chest shows impressive coronary artery calcifications, unsurprising given peripheral vascular disease history. She also has a rather large right side pleural effusion even with diuresis. Additional contributing factors to her clinical condition include likely malnutrition (son reports very poor appetite) and anemia (HGB 7.5).  Given frailty and co-morbidities, we will be limited in medical management of CHF. May need to consider involving palliative care. Given that we are limited lasix use with rising creatinine, would suggest right thoracentesis.  Check CMP. Suspect we will find low albumin. If so, could consider replacement to assist with diuresis. Would suggest dietary supplementation with Ensure. Patient may also need PRBC transfusion.  Coronary artery calcifications  Patient with CT chest this admission with significant coronary artery calcification. Unsurprising with her peripheral vascular disease history. Recent outpatient nuclear stress test indicated medium fixed defect of apex with akinesis but no reversible defect. With AKI on CKD IV and overall frailty, medical management at this time.  Continue ASA 81mg  LDL 57 as of May 2024. Continue Zetia 10mg  and Fenofibrate 160mg  (statin intolerant).   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  Peripheral vascular disease  Jessica Torres remotely followed with Dr. Allyson Sabal for h/o PAD with prior bilateral LE interventions with procedures back in 2015, 2016, and 2018. She was previously referred to vascular surgery for consideration of  left fem-pop but felt to be poor candidate due to lack of venous conduit. Last angiography was in 2018 with angioplasty of the right SFA CTO. She had been maintained on Plavix, also placed on Pletal in 2022. 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 Readmitted in May with anemia again and ultimately taken of P2Y12. ASA only.  No obvious exacerbation of symptoms. Continue with ASA 81mg , Zetia, Fenofibrate.  Anemia  Patient with HGB down to 7.5 today. CBC consistent with some iron deficiency. Will check iron studies/TIBC. Patient would likely benefit from transfusion.   Per primary team: Type 2 diabetes Chronic cough Cirrhosis Gastric antral vascular ectasia Hypothyroidism   Risk Assessment/Risk Scores:        New York Heart Association (NYHA) Functional Class NYHA Class IV        For questions or updates, please contact [facility] Please consult [email or web address] for contact info under    Signed, Perlie Gold, PA-C  10/31/2022 5:03 PM  I have seen and examined the patient along with Perlie Gold, PA-C .  I have reviewed the chart, notes and new data.  I agree with PA/NP's note.  Key new complaints: She is lying in bed and appears to be breathing fairly comfortably, elevated at about 30 degrees.  She complains of "sticking" pains in her anterior chest, not pressure.  She feels extremely weak.  She is short of breath with minimal activity. Key examination changes: Dullness to percussion in both lung bases, about half of the way up on the right.  Regular rate and rhythm.  Key new findings / data: Reviewed CT of the chest.  This shows extremely severe calcification in all 3 major coronary territories, consistent with multivessel CAD.  She has a very large right pleural effusion and a large left pleural effusion.  There is a nodule contour of the liver consistent with possible cirrhosis and on my review I think the spleen is also enlarged.  She has worsening anemia with very broad  RDW suggesting mixed iron deficiency and nutritional/vitamin deficiency as well.  PLAN: Unfortunately, I think the situation is fairly dismal. 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   -She may have ischemic rather than infarcted myocardium based on her ECG, but unfortunately she is an extremely poor candidate for coronary angiography or revascularization.   -High risk for renal failure with contrast exposure, low likelihood of revascularization options (due to her age, debility, comorbid conditions she is not a candidate for CABG; heavy and extensive calcification would make PCI challenging if not impossible; even if PCI were an option, placement of stents would put [place] in a dangerous situation due to her current evidence of active bleeding) -The presence of severe concomitant kidney problems and GI bleeding, as well as probable liver problems further compounds her poor prognosis.  -I think the best option is palliative care. -She may obtain significant (but probably temporary) relief of her dyspnea with a right thoracentesis or even bilateral thoracentesis. -She may benefit  symptomatically from transfusion followed by administration of diuretics. -Her son understands how challenging the situation is and believes that his mother is entering the final stages of life.  He does not want to see her suffer and.  He does not want to place her in an institutional care, but would rather have her at home, but readily admits that he does not know how to take care of her serious medical problems and needs assistance.  He is very interested in pursuing palliative care. -Reviewed importance of sodium restricted diet, carefully restricting fluids, daily weight monitoring arm to help prevent worsening fluid accumulation after she has been optimized to the best of our ability in the hospital.  Thurmon Fair, MD, [facility]  [facility] [phone] 10/31/2022, 6:09 PM

## 2022-10-31 NOTE — Progress Notes (Signed)
   Heart Failure Stewardship Pharmacist Progress Note   PCP: Rebekah Chesterfield, NP PCP-Cardiologist: Dietrich Pates, MD    HPI:  82 yo F with PMH of CHF, CAD, PAD, GAVE, T2DM, hypothyroidism, CKD IIIb, and anemia.   She was recently hospitalized at [facility] for acute on chronic systolic CHF. EF 40-45%. Difficulty with diuresis with hypotension. Her metoprolol was changed to carvedilol. However, at follow up with cardiology, carvedilol was switched back to metoprolol XL.   She presented to the ED on 8/21 with continued shortness of breath, LE edema, orthopnea, and cough. CXR with worsening bilateral pleural effusions with opacities. BNP 3983.   Current HF Medications: Beta Blocker: metoprolol XL 25 mg daily  Prior to admission HF Medications: Diuretic: furosemide 20 mg daily Beta blocker: carvedilol 3.125 mg BID AND metoprolol XL 25 mg daily on profile SGLT2i: Farxiga 10 mg daily AND Jardiance 10 mg daily on profile  Pertinent Lab Values: Serum creatinine 2.28, BUN 31, Potassium 3.9, Sodium 130, BNP 3983.8  Vital Signs: Weight: 129 lbs (admission weight: 125 lbs) Blood pressure: 100/40s  Heart rate: 70s  I/O: net -0.3L yesterday; net -0.5L since admission  Medication Assistance / Insurance Benefits Check: Does the patient have prescription insurance?  Yes Type of insurance plan: [employer] Medicare  Outpatient Pharmacy:  Prior to admission [facility] Is the patient willing to use [facility] TOC pharmacy at discharge? Yes Is the patient willing to transition their outpatient pharmacy to utilize a [facility] outpatient pharmacy?   No - gets free mail delivery service with [facility]    Assessment: 1. Acute on chronic systolic CHF (LVEF 40-45%). NYHA class II symptoms. - Off IV lasix. Creatinine worsened today. Still complains of shortness of breath and difficulty taking a deep breath. Strict I/Os and daily weights. Keep K>4 and Mg>2. - Continue metoprolol XL 25 mg daily (note  carvedilol discontinued by [facility] cardiology at follow up) - BP soft and with AKI, hold on adding ARB or MRA - Consider restarting Farxiga 10 mg daily tomorrow if creatinine improved vs at follow up   Plan: 1) Medication changes recommended at this time: - Discontinue carvedilol and Jardiance from medication list at discharge  2) Patient assistance: - None pending  3)  Education  - Patient has been educated on current HF medications and potential additions to HF medication regimen - Patient verbalizes understanding that over the next few months, these medication doses may change and more medications may be added to optimize HF regimen - Patient has been educated on basic disease state pathophysiology and goals of therapy   Sharen Hones, PharmD, BCPS Heart Failure Stewardship Pharmacist Phone [phone]

## 2022-10-31 NOTE — Progress Notes (Addendum)
Mobility Specialist Progress Note:    10/31/22 1036  Mobility  Activity Ambulated with assistance in hallway  Level of Assistance Minimal assist, patient does 75% or more  Assistive Device Front wheel walker  Distance Ambulated (ft) 200 ft  Activity Response Tolerated well  Mobility Referral Yes  $Mobility charge 1 Mobility  Mobility Specialist Start Time (ACUTE ONLY) 1010  Mobility Specialist Stop Time (ACUTE ONLY) 1023  Mobility Specialist Time Calculation (min) (ACUTE ONLY) 13 min   Pt received in bed, agreeable to ambulate. Pt needed MinA w/ bed mobility and contact guard for STS/ambulation. Ambulated on 1L/min, c/o mild SOB but SPO2 stayed between 89%-92%. Pt returned to bed w/ call bell and personal belongings in reach. All needs met and family in room.  Thompson Grayer Mobility Specialist  Please contact vis Secure Chat or  Rehab Office [phone]

## 2022-10-31 NOTE — Care Management Important Message (Signed)
Important Message  Patient Details  Name: Jessica Torres MRN: [id number] Date of Birth: May 15, 1940   Medicare Important Message Given:  Yes     Renie Ora 10/31/2022, 8:51 AM

## 2022-10-31 NOTE — Inpatient Diabetes Management (Signed)
Inpatient Diabetes Program Recommendations  AACE/ADA: New Consensus Statement on Inpatient Glycemic Control   Target Ranges:  Prepandial:   less than 140 mg/dL      Peak postprandial:   less than 180 mg/dL (1-2 hours)      Critically ill patients:  140 - 180 mg/dL    Latest Reference Range & Units 10/30/22 06:03 10/30/22 13:31 10/30/22 15:49 10/30/22 21:17 10/31/22 06:19 10/31/22 10:36  Glucose-Capillary 70 - 99 mg/dL 161 (H) 096 (H) 045 (H) 164 (H) 161 (H) 246 (H)   Review of Glycemic Control  Diabetes history: DM2 Outpatient Diabetes medications: Jardiance 25 mg daily, Toujeo 52 units daily, Humalog 10-20 units TID with meals Current orders for Inpatient glycemic control: Novolog 0-9 units TID with meals  Inpatient Diabetes Program Recommendations:    Insulin: Please consider ordering Novolog 4 units TID with meals for meal coverage if patient eats at least 50% of meals. If glucose becomes consistently over 180 mg/dl with Novolog meal coverage and correction, will need to consider ordering basal insulin.  NOTE: Patient takes Toujeo insulin as an outpatient. Toujeo can work up to 36 hours. Current CBG 161 mg/dl this morning. Would recommend to add meal coverage insulin. If glucose becomes consistently over 180 mg/dl with Novolog meal coverage and correction, will need to consider ordering basal insulin.  Thanks, Orlando Penner, RN, MSN, CDCES Diabetes Coordinator Inpatient Diabetes Program [phone] (Team Pager from 8am to 5pm)

## 2022-11-01 ENCOUNTER — Inpatient Hospital Stay (HOSPITAL_COMMUNITY): Payer: Medicare HMO

## 2022-11-01 DIAGNOSIS — I5021 Acute systolic (congestive) heart failure: Secondary | ICD-10-CM | POA: Diagnosis not present

## 2022-11-01 DIAGNOSIS — N179 Acute kidney failure, unspecified: Secondary | ICD-10-CM | POA: Diagnosis not present

## 2022-11-01 DIAGNOSIS — E1169 Type 2 diabetes mellitus with other specified complication: Secondary | ICD-10-CM

## 2022-11-01 DIAGNOSIS — K31819 Angiodysplasia of stomach and duodenum without bleeding: Secondary | ICD-10-CM | POA: Diagnosis not present

## 2022-11-01 DIAGNOSIS — I5043 Acute on chronic combined systolic (congestive) and diastolic (congestive) heart failure: Secondary | ICD-10-CM | POA: Diagnosis not present

## 2022-11-01 DIAGNOSIS — Z515 Encounter for palliative care: Secondary | ICD-10-CM

## 2022-11-01 DIAGNOSIS — D631 Anemia in chronic kidney disease: Secondary | ICD-10-CM | POA: Diagnosis not present

## 2022-11-01 DIAGNOSIS — E785 Hyperlipidemia, unspecified: Secondary | ICD-10-CM

## 2022-11-01 DIAGNOSIS — I5023 Acute on chronic systolic (congestive) heart failure: Secondary | ICD-10-CM | POA: Diagnosis not present

## 2022-11-01 DIAGNOSIS — N189 Chronic kidney disease, unspecified: Secondary | ICD-10-CM | POA: Diagnosis not present

## 2022-11-01 LAB — BODY FLUID CELL COUNT WITH DIFFERENTIAL
Eos, Fluid: 0 %
Lymphs, Fluid: 81 %
Monocyte-Macrophage-Serous Fluid: 14 % — ABNORMAL LOW (ref 50–90)
Neutrophil Count, Fluid: 5 % (ref 0–25)
Total Nucleated Cell Count, Fluid: 118 cu mm (ref 0–1000)

## 2022-11-01 LAB — CBC
HCT: 23.8 % — ABNORMAL LOW (ref 36.0–46.0)
Hemoglobin: 7.7 g/dL — ABNORMAL LOW (ref 12.0–15.0)
MCH: 29.8 pg (ref 26.0–34.0)
MCHC: 32.4 g/dL (ref 30.0–36.0)
MCV: 92.2 fL (ref 80.0–100.0)
Platelets: 211 10*3/uL (ref 150–400)
RBC: 2.58 MIL/uL — ABNORMAL LOW (ref 3.87–5.11)
RDW: 19.9 % — ABNORMAL HIGH (ref 11.5–15.5)
WBC: 7.1 10*3/uL (ref 4.0–10.5)
nRBC: 0 % (ref 0.0–0.2)

## 2022-11-01 LAB — GLUCOSE, CAPILLARY
Glucose-Capillary: 111 mg/dL — ABNORMAL HIGH (ref 70–99)
Glucose-Capillary: 198 mg/dL — ABNORMAL HIGH (ref 70–99)
Glucose-Capillary: 161 mg/dL — ABNORMAL HIGH (ref 70–99)

## 2022-11-01 LAB — COMPREHENSIVE METABOLIC PANEL
ALT: 19 U/L (ref 0–44)
AST: 50 U/L — ABNORMAL HIGH (ref 15–41)
Albumin: 2.6 g/dL — ABNORMAL LOW (ref 3.5–5.0)
Alkaline Phosphatase: 39 U/L (ref 38–126)
Anion gap: 9 (ref 5–15)
BUN: 32 mg/dL — ABNORMAL HIGH (ref 8–23)
CO2: 23 mmol/L (ref 22–32)
Calcium: 8.7 mg/dL — ABNORMAL LOW (ref 8.9–10.3)
Chloride: 96 mmol/L — ABNORMAL LOW (ref 98–111)
Creatinine, Ser: 2.39 mg/dL — ABNORMAL HIGH (ref 0.44–1.00)
GFR, Estimated: 20 mL/min — ABNORMAL LOW (ref >60)
Glucose, Bld: 148 mg/dL — ABNORMAL HIGH (ref 70–99)
Potassium: 4.1 mmol/L (ref 3.5–5.1)
Sodium: 128 mmol/L — ABNORMAL LOW (ref 135–145)
Total Bilirubin: 4.9 mg/dL — ABNORMAL HIGH (ref 0.3–1.2)
Total Protein: 7.1 g/dL (ref 6.5–8.1)

## 2022-11-01 LAB — LACTATE DEHYDROGENASE, PLEURAL OR PERITONEAL FLUID: LD, Fluid: 41 U/L — ABNORMAL HIGH (ref 3–23)

## 2022-11-01 LAB — ECHOCARDIOGRAM LIMITED
Area-P 1/2: 4.31 cm2
Calc EF: 32.2 %
Height: 65 in
MV M vel: 4.05 m/s
MV Peak grad: 65.6 mmHg
S' Lateral: 3.9 cm
Single Plane A2C EF: 37.8 %
Single Plane A4C EF: 29.4 %
Weight: 2137.58 oz

## 2022-11-01 LAB — IRON AND TIBC
Iron: 39 ug/dL (ref 28–170)
Saturation Ratios: 10 % — ABNORMAL LOW (ref 10.4–31.8)
TIBC: 405 ug/dL (ref 250–450)
UIBC: 366 ug/dL

## 2022-11-01 LAB — LACTATE DEHYDROGENASE: LDH: 137 U/L (ref 98–192)

## 2022-11-01 LAB — PROTEIN, PLEURAL OR PERITONEAL FLUID: Total protein, fluid: <3 g/dL

## 2022-11-01 MED ORDER — FUROSEMIDE 10 MG/ML IJ SOLN
80.0000 mg | Freq: Two times a day (BID) | INTRAMUSCULAR | Status: DC
  Administered 2022-11-01 – 2022-11-05 (×8): 80 mg via INTRAVENOUS
  Filled 2022-11-01 (×8): qty 8

## 2022-11-01 MED ORDER — ALBUMIN HUMAN 25 % IV SOLN
12.5000 g | Freq: Two times a day (BID) | INTRAVENOUS | Status: AC
  Administered 2022-11-01 – 2022-11-02 (×3): 12.5 g via INTRAVENOUS
  Filled 2022-11-01 (×3): qty 50

## 2022-11-01 MED ORDER — ONDANSETRON HCL 4 MG/2ML IJ SOLN
4.0000 mg | Freq: Four times a day (QID) | INTRAMUSCULAR | Status: DC | PRN
Start: 2022-11-01 — End: 2022-11-07
  Administered 2022-11-01 – 2022-11-07 (×4): 4 mg via INTRAVENOUS
  Filled 2022-11-01 (×6): qty 2

## 2022-11-01 MED ORDER — ALBUMIN HUMAN 25 % IV SOLN: 12.5000 g | Freq: Every day | INTRAVENOUS | Status: DC

## 2022-11-01 MED ORDER — FUROSEMIDE 10 MG/ML IJ SOLN
80.0000 mg | Freq: Every day | INTRAMUSCULAR | Status: DC
  Administered 2022-11-01: 80 mg via INTRAVENOUS
  Filled 2022-11-01: qty 8

## 2022-11-01 MED ORDER — LIDOCAINE HCL 1 % IJ SOLN
5.0000 mL | Freq: Once | INTRAMUSCULAR | Status: AC
  Administered 2022-11-01: 5 mL via INTRADERMAL

## 2022-11-01 NOTE — Plan of Care (Signed)

## 2022-11-01 NOTE — Procedures (Signed)
PROCEDURE SUMMARY:  Successful image-guided right thoracentesis. Yielded 1.2 L of hazy yellow fluid. Pt tolerated procedure well. No immediate complications. EBL = trace   Specimen was  sent for labs. CXR ordered.  Please see imaging section of Epic for full dictation.  Lynann Bologna Marleah Beever PA-C 11/01/2022 11:30 AM

## 2022-11-01 NOTE — Progress Notes (Signed)
  Echocardiogram 2D Echocardiogram has been performed.  Jessica Torres 11/01/2022, 2:39 PM

## 2022-11-01 NOTE — Progress Notes (Signed)
Consent signed at this time for thoracentesis. Prn IV zofran given at this time for nausea.

## 2022-11-01 NOTE — Progress Notes (Signed)
Patient off floor at this time for US Thoracentesis.

## 2022-11-01 NOTE — Consult Note (Signed)
Palliative Medicine  Name: Jessica Torres Date: 11/01/2022 MRN: [id number]  DOB: 07/19/1940  Patient Care Team: Rebekah Chesterfield, NP as PCP - General (Internal Medicine) Pricilla Riffle, MD as PCP - Cardiology (Cardiology) Cresenciano Genre Lilla Shook, [facility] (Pharmacist) Lanelle Bal, DO as Consulting Physician (Gastroenterology)    REASON FOR CONSULTATION: Jessica Torres is a 82 y.o. female with multiple medical problems including CAD, history of CHF with EF 40 to 45% DM, history of blood loss anemia GAVE, who was admitted to the hospital 10/29/2022 with acute on chronic CHF. CT chest showed severe CAD and large R. Sided pleural effusion. Patient felt to have limited options other than medical management given poor performance status. Palliative care was consulted to address goals.   SOCIAL HISTORY:     reports that she has never smoked. She has never used smokeless tobacco. She reports that she does not currently use alcohol. She reports that she does not use drugs.  Patient widowed. She lives at home with sister. She has 3 sons and a daughter.   ADVANCE DIRECTIVES:  Not on file  CODE STATUS: Full code  PAST MEDICAL HISTORY: Past Medical History:  Diagnosis Date   Arthritis    "fingers probably" (10/30/2016)   Bulging lumbar disc    Chronic lower back pain    Difficulty urinating    GERD (gastroesophageal reflux disease)    Heart murmur    hx   Hyperlipidemia    statin intolerant   Hypertension    Hypothyroidism    PAD (peripheral artery disease) (HCC)    Sleep apnea    "dx'd years ago; had severe problems; all of the sudden it just went away" (07/06/2014) *still gone" (10/30/2016)   Stroke (HCC) ~ 2010   denies residual on 10/30/2016   Type 2 diabetes mellitus (HCC)    Urgency of urination    Uterine cancer (HCC)     PAST SURGICAL HISTORY:  Past Surgical History:  Procedure Laterality Date   ANGIOPLASTY Right 01/22/2015   w/ atherectomy R sfa   BALLOON ANGIOPLASTY,  ARTERY Right    SFA/notes 07/06/2014   BIOPSY  04/28/2022   Procedure: BIOPSY;  Surgeon: Tressia Danas, MD;  Location: [facility] ENDOSCOPY;  Service: Gastroenterology;;   BREAST EXCISIONAL BIOPSY Left    BREAST SURGERY     CARPAL TUNNEL RELEASE Right 1980's   COLONOSCOPY WITH PROPOFOL N/A 11/26/2020   Procedure: COLONOSCOPY WITH PROPOFOL;  Surgeon: Lanelle Bal, DO;  Location: [facility] ENDO [facility];  Service: Endoscopy;  Laterality: N/A;  9:00am, pt cannot come in earlier   DILATION AND CURETTAGE OF UTERUS     ESOPHAGOGASTRODUODENOSCOPY N/A 04/28/2022   Procedure: ESOPHAGOGASTRODUODENOSCOPY (EGD);  Surgeon: Tressia Danas, MD;  Location: [facility] ENDOSCOPY;  Service: Gastroenterology;  Laterality: N/A;   ESOPHAGOGASTRODUODENOSCOPY (EGD) WITH PROPOFOL N/A 04/30/2022   Procedure: ESOPHAGOGASTRODUODENOSCOPY (EGD) WITH PROPOFOL;  Surgeon: Tressia Danas, MD;  Location: [facility] ENDOSCOPY;  Service: Gastroenterology;  Laterality: N/A;  With [facility]   ESOPHAGOGASTRODUODENOSCOPY (EGD) WITH PROPOFOL N/A 07/15/2022   Procedure: ESOPHAGOGASTRODUODENOSCOPY (EGD) WITH PROPOFOL;  Surgeon: Dolores Frame, MD;  Location: [facility] ENDO [facility];  Service: Gastroenterology;  Laterality: N/A;   HEMORRHOID SURGERY     "dr lanced it; in dr's office"   HOT HEMOSTASIS N/A 04/30/2022   Procedure: HOT HEMOSTASIS (ARGON PLASMA COAGULATION/BICAP);  Surgeon: Tressia Danas, MD;  Location: [facility] ENDOSCOPY;  Service: Gastroenterology;  Laterality: N/A;   HOT HEMOSTASIS  07/15/2022   Procedure: HOT HEMOSTASIS (ARGON  PLASMA COAGULATION/BICAP);  Surgeon: Marguerita Merles, Reuel Boom, MD;  Location: [facility];  Service: Gastroenterology;;   INCISION AND DRAINAGE BREAST ABSCESS Left    LOWER EXTREMITY ANGIOGRAM N/A 07/06/2014   Procedure: LOWER EXTREMITY ANGIOGRAM & R-SFA atherectomy and balloon angioplasty;  Surgeon: Runell Gess, MD;  Location: [facility] CATH LAB;  Service: Cardiovascular;  Laterality: R   LOWER EXTREMITY ANGIOGRAM  10/30/2016    LOWER EXTREMITY ANGIOGRAPHY N/A 10/30/2016   Procedure: Lower Extremity Angiography;  Surgeon: Runell Gess, MD;  Location: [facility] INVASIVE CV LAB;  Service: Cardiovascular;  Laterality: N/A;   PERIPHERAL VASCULAR CATHETERIZATION N/A 08/17/2014   Procedure: Lower Extremity Angiography;  Surgeon: Runell Gess, MD;  Location: [facility] INVASIVE CV LAB;  Service: Cardiovascular;  Laterality: N/A;   PERIPHERAL VASCULAR CATHETERIZATION Left 08/17/2014   Procedure: Peripheral Vascular Atherectomy and Viabahn stent L-SFA;  Surgeon: Runell Gess, MD; Laterality: Left; L-SFA   PERIPHERAL VASCULAR CATHETERIZATION N/A 01/11/2015   Procedure: Lower Extremity Angiography;  Surgeon: Runell Gess, MD;  Location: [facility] INVASIVE CV LAB;  Service: Cardiovascular;  Laterality: N/A;   PERIPHERAL VASCULAR CATHETERIZATION Right 01/22/2015   Procedure: Peripheral Vascular Atherectomy;  Surgeon: Runell Gess, MD;  Location: [facility] INVASIVE CV LAB;  Service: Cardiovascular;  Laterality: Right;   POLYPECTOMY  11/26/2020   Procedure: POLYPECTOMY;  Surgeon: Lanelle Bal, DO;  Location: [facility]O [facility];  Service: Endoscopy;;   POLYPECTOMY  07/15/2022   Procedure: POLYPECTOMY INTESTINAL;  Surgeon: Dolores Frame, MD;  Location: [facility];  Service: Gastroenterology;;   TUBAL LIGATION     VAGINAL HYSTERECTOMY      HEMATOLOGY/ONCOLOGY HISTORY:  Oncology History   No history exists.    ALLERGIES:  is allergic to morphine and codeine, latex, and statins.  MEDICATIONS:  Current Facility-Administered Medications  Medication Dose Route Frequency Provider Last Rate Last Admin   albumin human 25 % solution 12.5 g  12.5 g Intravenous Daily Arrien, York Ram, MD       aspirin chewable tablet 81 mg  81 mg Oral Daily Tu, Ching T, DO   81 mg at 11/01/22 1156   docusate sodium (COLACE) capsule 100 mg  100 mg Oral Daily Tu, Ching T, DO   100 mg at 11/01/22 1157   enoxaparin (LOVENOX) injection 30 mg  30 mg  Subcutaneous Q24H Tu, Ching T, DO   30 mg at 10/31/22 2142   ezetimibe (ZETIA) tablet 10 mg  10 mg Oral Daily Tu, Ching T, DO   10 mg at 11/01/22 1156   fenofibrate tablet 160 mg  160 mg Oral Daily Tu, Ching T, DO   160 mg at 11/01/22 1157   furosemide (LASIX) injection 80 mg  80 mg Intravenous Daily Little Ishikawa, MD   80 mg at 11/01/22 1344   guaiFENesin-dextromethorphan (ROBITUSSIN DM) 100-10 MG/5ML syrup 5 mL  5 mL Oral Q4H PRN Alberteen Sam, MD   5 mL at 11/01/22 0214   insulin aspart (novoLOG) injection 0-9 Units  0-9 Units Subcutaneous TID WC Alberteen Sam, MD   2 Units at 11/01/22 1344   levothyroxine (SYNTHROID) tablet 88 mcg  88 mcg Oral QAC breakfast Tu, Ching T, DO   88 mcg at 11/01/22 4098   metoprolol succinate (TOPROL-XL) 24 hr tablet 25 mg  25 mg Oral Daily Tu, Ching T, DO   25 mg at 11/01/22 1156   ondansetron (ZOFRAN) injection 4 mg  4 mg Intravenous Q6H PRN Arrien, York Ram,  MD   4 mg at 11/01/22 1914   Oral care mouth rinse  15 mL Mouth Rinse PRN Tu, Ching T, DO       pantoprazole (PROTONIX) EC tablet 40 mg  40 mg Oral BID Tu, Ching T, DO   40 mg at 11/01/22 1156   polyethylene glycol (MIRALAX / GLYCOLAX) packet 17 g  17 g Oral Daily Tu, Ching T, DO   17 g at 10/31/22 0916    VITAL SIGNS: BP (!) 111/51 (BP Location: Left Arm)   Pulse 76   Temp 98.5 F (36.9 C) (Oral)   Resp 18   Ht 5\' 5"  (1.651 m)   Wt 133 lb 9.6 oz (60.6 kg)   SpO2 100%   BMI 22.23 kg/m  Filed Weights   10/30/22 0417 10/31/22 0306 11/01/22 0437  Weight: 125 lb 10.6 oz (57 kg) 129 lb 6.6 oz (58.7 kg) 133 lb 9.6 oz (60.6 kg)    Estimated body mass index is 22.23 kg/m as calculated from the following:   Height as of this encounter: 5\' 5"  (1.651 m).   Weight as of this encounter: 133 lb 9.6 oz (60.6 kg).  LABS: CBC:    Component Value Date/Time   WBC 7.1 11/01/2022 0340   HGB 7.7 (L) 11/01/2022 0340   HGB 11.9 08/22/2021 1131   HCT 23.8 (L) 11/01/2022 0340    HCT 37.5 08/22/2021 1131   PLT 211 11/01/2022 0340   PLT 255 08/22/2021 1131   MCV 92.2 11/01/2022 0340   MCV 96 08/22/2021 1131   NEUTROABS 5.3 07/11/2022 1521   NEUTROABS 3.3 08/22/2021 1131   LYMPHSABS 1.6 07/11/2022 1521   LYMPHSABS 1.5 08/22/2021 1131   MONOABS 1.0 07/11/2022 1521   EOSABS 0.1 07/11/2022 1521   EOSABS 0.3 08/22/2021 1131   BASOSABS 0.1 07/11/2022 1521   BASOSABS 0.1 08/22/2021 1131   Comprehensive Metabolic Panel:    Component Value Date/Time   NA 128 (L) 11/01/2022 0340   NA 138 02/21/2022 1111   K 4.1 11/01/2022 0340   CL 96 (L) 11/01/2022 0340   CO2 23 11/01/2022 0340   BUN 32 (H) 11/01/2022 0340   BUN 17 02/21/2022 1111   CREATININE 2.39 (H) 11/01/2022 0340   CREATININE 0.98 (H) 01/03/2015 1048   GLUCOSE 148 (H) 11/01/2022 0340   CALCIUM 8.7 (L) 11/01/2022 0340   AST 50 (H) 11/01/2022 0340   ALT 19 11/01/2022 0340   ALKPHOS 39 11/01/2022 0340   BILITOT 4.9 (H) 11/01/2022 0340   BILITOT 0.9 02/21/2022 1111   PROT 7.1 11/01/2022 0340   PROT 6.7 02/21/2022 1111   ALBUMIN 2.6 (L) 11/01/2022 0340   ALBUMIN 3.4 (L) 02/21/2022 1111    RADIOGRAPHIC STUDIES: US THORACENTESIS ASP PLEURAL SPACE W/IMG GUIDE  Result Date: 11/01/2022 INDICATION: 82 year old female presents with shortness of breath, previous imaging showed bilateral pleural effusion right greater than left. Request for therapeutic and diagnostic thoracentesis. EXAM: ULTRASOUND GUIDED RIGHT THORACENTESIS MEDICATIONS: 5 mL 1% lidocaine COMPLICATIONS: None immediate. PROCEDURE: An ultrasound guided thoracentesis was thoroughly discussed with the patient and questions answered. The benefits, risks, alternatives and complications were also discussed. The patient understands and wishes to proceed with the procedure. Written consent was obtained. Ultrasound was performed to localize and mark an adequate pocket of fluid in the right chest. The area was then prepped and draped in the normal sterile  fashion. 1% Lidocaine was used for local anesthesia. Under ultrasound guidance a 6 Fr Safe-T-Centesis catheter was introduced. Thoracentesis  was performed. The catheter was removed and a dressing applied. FINDINGS: A total of approximately 1.1 L of hazy yellow fluid was removed. Samples were sent to the laboratory as requested by the clinical team. Postprocedure chest x-ray ordered, results pending. IMPRESSION: Successful ultrasound guided right thoracentesis yielding 1.1 L of pleural fluid. Performed by: Lawernce Ion, PA-C Electronically Signed   By: Judie Petit.  Shick M.D.   On: 11/01/2022 12:57   DG Chest 1 View  Result Date: 11/01/2022 CLINICAL DATA:  Status post right thoracentesis. EXAM: CHEST  1 VIEW COMPARISON:  Two-view chest x-ray 10/29/2022 FINDINGS: The heart is enlarged. The right pleural effusion is markedly decreased. No significant pneumothorax is present. A left pleural effusion remains. Left basilar airspace opacity is noted. Mild pulmonary vascular congestion is present. The visualized soft tissues and bony thorax are unremarkable. IMPRESSION: 1. Marked decrease in right pleural effusion without significant pneumothorax. 2. Persistent left pleural effusion and basilar airspace disease. 3. Cardiomegaly and mild pulmonary vascular congestion. Electronically Signed   By: Marin Roberts M.D.   On: 11/01/2022 12:08   CT CHEST WO CONTRAST  Result Date: 10/31/2022 CLINICAL DATA:  Diffuse/interstitial lung disease EXAM: CT CHEST WITHOUT CONTRAST TECHNIQUE: Multidetector CT imaging of the chest was performed following the standard protocol without IV contrast. RADIATION DOSE REDUCTION: This exam was performed according to the departmental dose-optimization program which includes automated exposure control, adjustment of the mA and/or kV according to patient size and/or use of iterative reconstruction technique. COMPARISON:  Chest radiograph 10/29/2022 FINDINGS: Cardiovascular: Aortic atherosclerosis  without aneurysm. The heart is enlarged. There are coronary artery calcifications. No pericardial effusion. Decreased density of the blood pool suggests anemia. Mediastinum/Nodes: Shotty mediastinal nodes including an 11 mm anterior paratracheal node. Hilar assessment is limited in the absence of IV contrast. Esophagus is grossly normal, partially obscured by adjacent pleural effusions. No thyroid nodule. Lungs/Pleura: Moderate to large right and moderate left pleural effusion. Assessment of lung parenchyma and interstitial lung disease is limited in the setting of pleural effusions and motion artifact. Subtotal atelectasis and opacification of both lower lobes, compressive atelectasis within the dependent right upper lobe. Additional ground-glass opacities in both lower lobes in the periphery of the right upper lobe. No bronchiectasis. Trachea and central airways are clear. Upper Abdomen: Nodular hepatic contour suggestive of cirrhosis. No acute upper abdominal findings. Musculoskeletal: There are no acute or suspicious osseous abnormalities. No chest wall soft tissue abnormalities. IMPRESSION: 1. Moderate to large right and moderate left pleural effusion. Subtotal atelectasis and opacification of both lower lobes, compressive atelectasis within the dependent right upper lobe. Additional ground-glass opacities in both lower lobes in the periphery of the right upper lobe. Findings may be combination of compressive atelectasis and pulmonary edema or infection. 2. Cardiomegaly with coronary artery calcifications. 3. Nodular hepatic contour suggestive of cirrhosis. 4. Shotty mediastinal nodes are likely reactive. Aortic Atherosclerosis (ICD10-I70.0). Electronically Signed   By: Narda Rutherford M.D.   On: 10/31/2022 16:14   DG Chest 2 View  Result Date: 10/29/2022 CLINICAL DATA:  Cough and shortness of breath EXAM: CHEST - 2 VIEW COMPARISON:  X-ray 04/27/2022 FINDINGS: Underinflation. Bilateral pleural effusions  and adjacent opacities. No pneumothorax. Vascular congestion. Mild interstitial prominence. Component of edema is not excluded. Recommend follow-up. Overlapping cardiac leads IMPRESSION: Developing bilateral pleural effusions with the adjacent opacities and interstitial changes. Recommend follow-up Electronically Signed   By: Karen Kays M.D.   On: 10/29/2022 13:56    PERFORMANCE STATUS (ECOG) : 3 - Symptomatic, >  50% confined to bed  Review of Systems Unless otherwise noted, a complete review of systems is negative.  Physical Exam General: NAD Pulmonary: unlabored Extremities: no edema, no joint deformities Skin: no rashes Neurological: Weakness but otherwise nonfocal  IMPRESSION: Patient admitted with CHF found to have large R. Sided pleural effusion and large amount of coronary calcifications on CT.   Patient s/p thoracentesis today with 1.2L removed. She feels symptomatically improved following procedure. Breathing is better.   I met with patient and family (son, sister). Prior to this hospitalization, patient had been living at home with her sister and had been mostly bedbound for several years. However, family attributes her poor performance status to patient's unwillingness to want to bother others instead of actual physical limitations due to her poor health.  Hospice has been previously recommended but family is now wanting to hold on this referral. Son feels that patient will improve now that she has had the thoracentesis. He would like to pursue home health and physical therapy. However, he says that he would be interested in hospice in the event of future decline.  Son would not let me discuss code status today with patient. He says he thinks patient would like opt for DNR/DNI but wants to discuss this with her privately at a later time.   PLAN: -Continue current scope of treatment -Family discussing goals/code status -Dispo: likely home health. Recommend outpatient palliative  care.    Time Total: 60 minutes  Visit consisted of counseling and education dealing with the complex and emotionally intense issues of symptom management and palliative care in the setting of serious and potentially life-threatening illness.Greater than 50%  of this time was spent counseling and coordinating care related to the above assessment and plan.  Signed by: Laurette Schimke, PhD, NP-C

## 2022-11-01 NOTE — Progress Notes (Signed)
Rounding Note    Patient Name: Jessica Torres Date of Encounter: 11/01/2022  [facility] Cardiologist: Dietrich Pates, MD   Subjective   BP 107/43.  I/os not recorded.  Lasix held yesterday.  Worsening renal function (creatinine 1.89 > 2.1 > 2.3 > 2.4).  Sodium worsened to 128.    Inpatient Medications    Scheduled Meds:  aspirin  81 mg Oral Daily   docusate sodium  100 mg Oral Daily   enoxaparin (LOVENOX) injection  30 mg Subcutaneous Q24H   ezetimibe  10 mg Oral Daily   fenofibrate  160 mg Oral Daily   insulin aspart  0-9 Units Subcutaneous TID WC   levothyroxine  88 mcg Oral QAC breakfast   metoprolol succinate  25 mg Oral Daily   pantoprazole  40 mg Oral BID   polyethylene glycol  17 g Oral Daily   Continuous Infusions:  PRN Meds: guaiFENesin-dextromethorphan, mouth rinse   Vital Signs    Vitals:   10/31/22 1652 10/31/22 1931 11/01/22 0009 11/01/22 0437  BP: 99/64 (!) 105/50 (!) 100/43 (!) 107/43  Pulse: 70 73  77  Resp: 20 16    Temp: 98.4 F (36.9 C) 98.4 F (36.9 C) 98.6 F (37 C) 98.5 F (36.9 C)  TempSrc: Oral Oral Oral Oral  SpO2:  95%  94%  Weight:    60.6 kg  Height:        Intake/Output Summary (Last 24 hours) at 11/01/2022 1610 Last data filed at 10/31/2022 1320 Gross per 24 hour  Intake 566 ml  Output --  Net 566 ml      11/01/2022    4:37 AM 10/31/2022    3:06 AM 10/30/2022    4:17 AM  Last 3 Weights  Weight (lbs) 133 lb 9.6 oz 129 lb 6.6 oz 125 lb 10.6 oz  Weight (kg) 60.6 kg 58.7 kg 57 kg      Telemetry    NSR - Personally Reviewed  ECG    No new ECG - Personally Reviewed  Physical Exam   GEN: Chronically ill-appearing Neck: + JVD Cardiac: RRR, no murmurs, rubs, or gallops.  Respiratory: Bibasilar crackles GI: Soft, nontender MS: No edema Neuro:  Nonfocal  Psych: Normal affect   Labs    High Sensitivity Troponin:  No results for input(s): "TROPONINIHS" in the last 720 hours.   Chemistry Recent Labs  Lab  10/30/22 0149 10/31/22 0442 11/01/22 0340  NA 131* 130* 128*  K 4.0 3.9 4.1  CL 99 96* 96*  CO2 20* 23 23  GLUCOSE 267* 214* 148*  BUN 30* 31* 32*  CREATININE 2.10* 2.28* 2.39*  CALCIUM 8.5* 8.6* 8.7*  PROT  --   --  7.1  ALBUMIN  --   --  2.6*  AST  --   --  50*  ALT  --   --  19  ALKPHOS  --   --  39  BILITOT  --   --  4.9*  GFRNONAA 24* 21* 20*  ANIONGAP 12 11 9     Lipids No results for input(s): "CHOL", "TRIG", "HDL", "LABVLDL", "LDLCALC", "CHOLHDL" in the last 168 hours.  Hematology Recent Labs  Lab 10/30/22 0149 10/31/22 0442 11/01/22 0340  WBC 6.7 6.2 7.1  RBC 2.61* 2.55* 2.58*  HGB 7.7* 7.5* 7.7*  HCT 23.4* 23.4* 23.8*  MCV 89.7 91.8 92.2  MCH 29.5 29.4 29.8  MCHC 32.9 32.1 32.4  RDW 20.8* 20.4* 19.9*  PLT 209 202 211  Thyroid No results for input(s): "TSH", "FREET4" in the last 168 hours.  BNP Recent Labs  Lab 10/29/22 1222  BNP 3,983.8*    DDimer No results for input(s): "DDIMER" in the last 168 hours.   Radiology    CT CHEST WO CONTRAST  Result Date: 10/31/2022 CLINICAL DATA:  Diffuse/interstitial lung disease EXAM: CT CHEST WITHOUT CONTRAST TECHNIQUE: Multidetector CT imaging of the chest was performed following the standard protocol without IV contrast. RADIATION DOSE REDUCTION: This exam was performed according to the departmental dose-optimization program which includes automated exposure control, adjustment of the mA and/or kV according to patient size and/or use of iterative reconstruction technique. COMPARISON:  Chest radiograph 10/29/2022 FINDINGS: Cardiovascular: Aortic atherosclerosis without aneurysm. The heart is enlarged. There are coronary artery calcifications. No pericardial effusion. Decreased density of the blood pool suggests anemia. Mediastinum/Nodes: Shotty mediastinal nodes including an 11 mm anterior paratracheal node. Hilar assessment is limited in the absence of IV contrast. Esophagus is grossly normal, partially obscured by  adjacent pleural effusions. No thyroid nodule. Lungs/Pleura: Moderate to large right and moderate left pleural effusion. Assessment of lung parenchyma and interstitial lung disease is limited in the setting of pleural effusions and motion artifact. Subtotal atelectasis and opacification of both lower lobes, compressive atelectasis within the dependent right upper lobe. Additional ground-glass opacities in both lower lobes in the periphery of the right upper lobe. No bronchiectasis. Trachea and central airways are clear. Upper Abdomen: Nodular hepatic contour suggestive of cirrhosis. No acute upper abdominal findings. Musculoskeletal: There are no acute or suspicious osseous abnormalities. No chest wall soft tissue abnormalities. IMPRESSION: 1. Moderate to large right and moderate left pleural effusion. Subtotal atelectasis and opacification of both lower lobes, compressive atelectasis within the dependent right upper lobe. Additional ground-glass opacities in both lower lobes in the periphery of the right upper lobe. Findings may be combination of compressive atelectasis and pulmonary edema or infection. 2. Cardiomegaly with coronary artery calcifications. 3. Nodular hepatic contour suggestive of cirrhosis. 4. Shotty mediastinal nodes are likely reactive. Aortic Atherosclerosis (ICD10-I70.0). Electronically Signed   By: Narda Rutherford M.D.   On: 10/31/2022 16:14    Cardiac Studies     Patient Profile     82 y.o. female with a hx of PAD, right carotid artery disease,  hypertension, hyperlipidemia, GIB with GAVE/gastritis, CKD IIIb, arthritis, GERD, hypothyroidism, CVA, OSA, DM, cirrhosis who is being seen for the evaluation of acute congestive heart failure   Assessment & Plan    Acute on chronic systolic heart failure: Patient admitted 8/22 for management of recurrent heart failure symptoms including nocturnal dyspnea, exertional intolerance, lower extremity edema.  At time of admission, patient taking  Lasix 40 mg p.o. daily, metoprolol succinate 25 mg, Jardiance 25mg .  She did see [facility] cardiology on 8/6 but initiation of ACE/ARB/ARNI deferred.  Recent outpatient nuclear stress test with no convincing evidence of inducible ischemia.  Medium sized fixed defect of the apex with associated akinesis consistent with prior myocardial infarction.  Most recent echo shows LVEF of 40 to 45% with anterolateral hypokinesis. Prior echo in May of this year also found LVEF 40-45% with similar apical septal akinesis. LVEF 60-65% 02/24.  BNP this admission 3983.9.  Chest x-ray with bilateral pleural effusions and adjacent opacities. Patient status post IV Lasix 40 mg once 8/21 and again on 8/22.  Net output reported only as 344 mL. Lasix held today with creatinine rising 1.89->2.10->2.28. Patient with improvement in peripheral edema following diuresis but still has physical exam  and imaging evidence of vascular congestion/volume overload.  Overall challenging situation. Patient is very frail following several years spent primarily in bed. While she has never undergone coronary angiography, her recent stress test is consistent with prior infarct. Additionally CT chest shows impressive coronary artery calcifications, unsurprising given peripheral vascular disease history. She also has a rather large right side pleural effusion even with diuresis. Additional contributing factors to her clinical condition include likely malnutrition (son reports very poor appetite) and anemia (HGB 7.5).  Given frailty and co-morbidities, we will be limited in medical management of CHF. Recommend involving palliative care.  Update echo Low albumin likely contributing to volume overload. Consider replacement to assist with diuresis. Would suggest dietary supplementation with Ensure.  Appears volume overloaded (+JVD, crackles on exam) and worsening renal function with holding lasix yesterday, concern for cardiorenal syndrome, recommend restarting  diuresis with IV lasix 80 mg today   Coronary artery calcificationsPatient with CT chest this admission with significant coronary artery calcification. Unsurprising with her peripheral vascular disease history. Recent outpatient nuclear stress test indicated medium fixed defect of apex with akinesis but no reversible defect. With AKI on CKD IV and overall frailty, medical management at this time.  Continue ASA 81mg  LDL 57 as of May 2024. Continue Zetia 10mg  and Fenofibrate 160mg  (statin intolerant).    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 Barwick remotely followed with Dr. Allyson Sabal for h/o PAD with prior bilateral LE interventions with procedures back in 2015, 2016, and 2018. She was previously referred to vascular surgery for consideration of left fem-pop but felt to be poor candidate due to lack of venous conduit. Last angiography was in 2018 with angioplasty of the right SFA CTO. She had been maintained on Plavix, also placed on Pletal in 2022. 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 Readmitted in May with anemia again and ultimately taken of P2Y12. ASA only.  Continue with ASA 81mg , Zetia, Fenofibrate.   Anemia Patient with HGB down to 7.7 today.   For questions or updates, please contact [facility] Please consult [email or web address] for contact info under        Signed, Little Ishikawa, MD  11/01/2022, 8:22 AM

## 2022-11-01 NOTE — Progress Notes (Signed)
Progress Note   [name]:[id number] DOB: 1940-09-25 DOA: 10/29/2022     3 DOS: the patient was seen and examined on 11/01/2022   Brief hospital course: Mrs. Alperin is a 82 y.o. 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  In the ER, CXR with effusions, edema and BNP >2000   8/21: Admitted on IV Lasix 8/22: Improving and off O2, still symptomatic 8/23: Breathing worse, cough worse, now requiring 1-2L O2; Cardiology consulted; CT chest showed large effusion 08/24 IV furosemide with IV albumin.   Assessment and Plan: * Acute on chronic systolic CHF (congestive heart failure) (HCC) Echocardiogram with reduction of LV systolic function down to 30 to 35% (worsening from 05/24), severe hypokinesis of the left ventricular mid apical inferoseptal wall, septal wall, lateral wall and apical segment. Small pericardial effusion, moderate to severe mitral regurgitation, RVSP 39.4   Not documented urine output.  Waynetta Sandy has not decreased since admission.  Systolic blood pressure 100 to 114 mmHg.   Plan to continue diuresis with furosemide 80 mg IV q12  Will add albumin to improve bioavailability of furosemide.   Continue metoprolol.  Limited pharmacologic options due to reduced GFR and risk of hypotension.  Holding RAAS inhibition and SGLT 2 inh.   Acute hypoxemic respiratory failure, due to acute cardiogenic pulmonary edema. Right pleural effusion sp thoracentesis, 1.2  L removed. Transudate.  02 saturation today is 100% on 2 L.min per [place].   Acute kidney injury on chronic kidney disease stage IV (HCC) Cardiorenal syndrome, hyponatremia.   Renal function with serum cr at 2,39 with K at 4,1 and serum bicarbonate at 23. Na 128.   Plan to continue diuresis with furosemide IV 80 mg q12 hrs.  Follow up renal function in  am.   Type 2 diabetes mellitus with hyperlipidemia (HCC) Continue insulin sliding scale for glucose cover and monitoring.   Continue fenofibrate and ezetimibe.   GAVE (gastric antral vascular ectasia) Continue with pantoprazole.   History of CVA (cerebrovascular accident) - Continue aspirin, Zetia, fibrate  Hypothyroidism TSH normal at [facility] this month. - Continue levothyroxine        Subjective: Patient feeling very weak and deconditioned, dyspnea has improved post thoracentesis.   Physical Exam: Vitals:   11/01/22 0009 11/01/22 0437 11/01/22 0900 11/01/22 1210  BP: (!) 100/43 (!) 107/43 (!) 114/53 (!) 111/51  Pulse:  77 79 76  Resp:   (!) 23 18  Temp: 98.6 F (37 C) 98.5 F (36.9 C) 98.5 F (36.9 C)   TempSrc: Oral Oral Oral   SpO2:  94% 97% 100%  Weight:  60.6 kg    Height:       Neurology awake and alert ENT with mild pallor Cardiovascular with S1 and S2 present and regular with no gallops or rubs, positive systolic murmur at the apex Positive JVD Trace lower extremity edema Respiratory with bilateral rales with no wheezing or rhonchi Abdomen with no distention  Data Reviewed:    Family Communication: I spoke with patient's son at the bedside, we talked in detail about patient's condition, plan of care and prognosis and all questions were addressed.   Disposition: Status is: Inpatient Remains inpatient appropriate because: IV diuresis   Planned Discharge Destination: Home     Author: Coralie Keens, MD 11/01/2022 4:32 PM  For on call  review [email or web address].

## 2022-11-02 DIAGNOSIS — N189 Chronic kidney disease, unspecified: Secondary | ICD-10-CM | POA: Diagnosis not present

## 2022-11-02 DIAGNOSIS — D649 Anemia, unspecified: Secondary | ICD-10-CM | POA: Diagnosis not present

## 2022-11-02 DIAGNOSIS — N179 Acute kidney failure, unspecified: Secondary | ICD-10-CM | POA: Diagnosis not present

## 2022-11-02 DIAGNOSIS — D509 Iron deficiency anemia, unspecified: Secondary | ICD-10-CM

## 2022-11-02 DIAGNOSIS — K31819 Angiodysplasia of stomach and duodenum without bleeding: Secondary | ICD-10-CM | POA: Diagnosis not present

## 2022-11-02 DIAGNOSIS — E1169 Type 2 diabetes mellitus with other specified complication: Secondary | ICD-10-CM | POA: Diagnosis not present

## 2022-11-02 DIAGNOSIS — I5043 Acute on chronic combined systolic (congestive) and diastolic (congestive) heart failure: Secondary | ICD-10-CM | POA: Diagnosis not present

## 2022-11-02 LAB — GLUCOSE, CAPILLARY
Glucose-Capillary: 116 mg/dL — ABNORMAL HIGH (ref 70–99)
Glucose-Capillary: 231 mg/dL — ABNORMAL HIGH (ref 70–99)
Glucose-Capillary: 206 mg/dL — ABNORMAL HIGH (ref 70–99)
Glucose-Capillary: 162 mg/dL — ABNORMAL HIGH (ref 70–99)

## 2022-11-02 LAB — BASIC METABOLIC PANEL
Anion gap: 9 (ref 5–15)
BUN: 35 mg/dL — ABNORMAL HIGH (ref 8–23)
CO2: 25 mmol/L (ref 22–32)
Calcium: 8.5 mg/dL — ABNORMAL LOW (ref 8.9–10.3)
Chloride: 97 mmol/L — ABNORMAL LOW (ref 98–111)
Creatinine, Ser: 2.13 mg/dL — ABNORMAL HIGH (ref 0.44–1.00)
GFR, Estimated: 23 mL/min — ABNORMAL LOW (ref >60)
Glucose, Bld: 116 mg/dL — ABNORMAL HIGH (ref 70–99)
Potassium: 3.5 mmol/L (ref 3.5–5.1)
Sodium: 131 mmol/L — ABNORMAL LOW (ref 135–145)

## 2022-11-02 LAB — MAGNESIUM: Magnesium: 2.2 mg/dL (ref 1.7–2.4)

## 2022-11-02 MED ORDER — POTASSIUM CHLORIDE CRYS ER 20 MEQ PO TBCR
20.0000 meq | EXTENDED_RELEASE_TABLET | Freq: Once | ORAL | Status: DC
  Filled 2022-11-02: qty 1

## 2022-11-02 MED ORDER — SODIUM CHLORIDE 0.9 % IV SOLN
510.0000 mg | Freq: Once | INTRAVENOUS | Status: AC
  Administered 2022-11-02: 510 mg via INTRAVENOUS
  Filled 2022-11-02: qty 17

## 2022-11-02 MED ORDER — POTASSIUM CHLORIDE CRYS ER 20 MEQ PO TBCR
20.0000 meq | EXTENDED_RELEASE_TABLET | Freq: Once | ORAL | Status: AC
  Administered 2022-11-02: 20 meq via ORAL

## 2022-11-02 MED ORDER — ENSURE ENLIVE PO LIQD
237.0000 mL | Freq: Two times a day (BID) | ORAL | Status: DC
  Administered 2022-11-03 – 2022-11-07 (×7): 237 mL via ORAL

## 2022-11-02 NOTE — Progress Notes (Signed)
Rounding Note    Patient Name: Jessica Torres Date of Encounter: 11/02/2022  [facility] Cardiologist: Dietrich Pates, MD   Subjective   BP 102/40.  I/Os not recorded.  Creatinine improved (2.39 > 2.13), hyponatremia improved (128 > 131).  Underwent right thoracentesis yesterday, 1.2 L removed.  She reports dyspnea improved today   Inpatient Medications    Scheduled Meds:  aspirin  81 mg Oral Daily   docusate sodium  100 mg Oral Daily   enoxaparin (LOVENOX) injection  30 mg Subcutaneous Q24H   ezetimibe  10 mg Oral Daily   fenofibrate  160 mg Oral Daily   furosemide  80 mg Intravenous BID   insulin aspart  0-9 Units Subcutaneous TID WC   levothyroxine  88 mcg Oral QAC breakfast   metoprolol succinate  25 mg Oral Daily   pantoprazole  40 mg Oral BID   polyethylene glycol  17 g Oral Daily   Continuous Infusions:  albumin human 12.5 g (11/01/22 1659)   PRN Meds: guaiFENesin-dextromethorphan, ondansetron (ZOFRAN) IV, mouth rinse   Vital Signs    Vitals:   11/01/22 2049 11/01/22 2356 11/02/22 0450 11/02/22 0735  BP: (!) 109/48 (!) 112/43  (!) 102/40  Pulse:  79  75  Resp: 14   18  Temp: 98.4 F (36.9 C) 98.9 F (37.2 C) 98.1 F (36.7 C) 98.7 F (37.1 C)  TempSrc: Oral Oral Oral Oral  SpO2: 96% 96%  94%  Weight:      Height:        Intake/Output Summary (Last 24 hours) at 11/02/2022 0908 Last data filed at 11/02/2022 0500 Gross per 24 hour  Intake --  Output 600 ml  Net -600 ml      11/01/2022    4:37 AM 10/31/2022    3:06 AM 10/30/2022    4:17 AM  Last 3 Weights  Weight (lbs) 133 lb 9.6 oz 129 lb 6.6 oz 125 lb 10.6 oz  Weight (kg) 60.6 kg 58.7 kg 57 kg      Telemetry    NSR - Personally Reviewed  ECG    No new ECG - Personally Reviewed  Physical Exam   GEN: Chronically ill-appearing Neck: + JVD Cardiac: RRR, no murmurs, rubs, or gallops.  Respiratory: Bibasilar crackles GI: Soft, nontender MS: No edema Neuro:  Nonfocal  Psych:  Normal affect   Labs    High Sensitivity Troponin:  No results for input(s): "TROPONINIHS" in the last 720 hours.   Chemistry Recent Labs  Lab 10/31/22 0442 11/01/22 0340 11/02/22 0432  NA 130* 128* 131*  K 3.9 4.1 3.5  CL 96* 96* 97*  CO2 23 23 25   GLUCOSE 214* 148* 116*  BUN 31* 32* 35*  CREATININE 2.28* 2.39* 2.13*  CALCIUM 8.6* 8.7* 8.5*  MG  --   --  2.2  PROT  --  7.1  --   ALBUMIN  --  2.6*  --   AST  --  50*  --   ALT  --  19  --   ALKPHOS  --  39  --   BILITOT  --  4.9*  --   GFRNONAA 21* 20* 23*  ANIONGAP 11 9 9     Lipids No results for input(s): "CHOL", "TRIG", "HDL", "LABVLDL", "LDLCALC", "CHOLHDL" in the last 168 hours.  Hematology Recent Labs  Lab 10/30/22 0149 10/31/22 0442 11/01/22 0340  WBC 6.7 6.2 7.1  RBC 2.61* 2.55* 2.58*  HGB 7.7* 7.5* 7.7*  HCT 23.4* 23.4* 23.8*  MCV 89.7 91.8 92.2  MCH 29.5 29.4 29.8  MCHC 32.9 32.1 32.4  RDW 20.8* 20.4* 19.9*  PLT 209 202 211   Thyroid No results for input(s): "TSH", "FREET4" in the last 168 hours.  BNP Recent Labs  Lab 10/29/22 1222  BNP 3,983.8*    DDimer No results for input(s): "DDIMER" in the last 168 hours.   Radiology    ECHOCARDIOGRAM LIMITED  Result Date: 11/01/2022    ECHOCARDIOGRAM LIMITED REPORT   Patient Name:   ZACHERY LORIG Date of Exam: 11/01/2022 Medical Rec #:  [id number]      Height:       65.0 in Accession #:    [id number]     Weight:       133.6 lb Date of Birth:  31-Jan-1941      BSA:          1.666 m Patient Age:    64 years       BP:           111/51 mmHg Patient Gender: F              HR:           79 bpm. Exam Location:  Inpatient Procedure: Limited Echo, Cardiac Doppler and Limited Color Doppler Indications:    CHF-Acute Systolic I50.21  History:        Patient has prior history of Echocardiogram examinations, most                 recent 07/14/2022. CAD and Previous Myocardial Infarction, PAD,                 Cirrhosis, Carotid Disease and Stroke, Signs/Symptoms:Murmur;                  Risk Factors:Dyslipidemia, Diabetes, Hypertension and                 Non-Smoker.  Sonographer:    Aron Baba Referring Phys: Little Ishikawa IMPRESSIONS  1. Limited study  2. Left ventricular ejection fraction, by estimation, is 30 to 35%. Left ventricular ejection fraction by 2D MOD biplane is 32.2 %. The left ventricle has moderately decreased function. The left ventricle demonstrates regional wall motion abnormalities (see scoring diagram/findings for description). diastolic function is indeterminate due to lack of tissue doppler. There is severe hypokinesis of the left ventricular, mid-apical inferoseptal wall, septal wall, lateral wall and apical segment.  3. A small pericardial effusion is present.  4. Moderate to severe mitral valve regurgitation.  5. The aortic root is heaily calcified, but there does not appears to be significant aortic stenosis. Doppler velocities were not obtained.  6. There is mildly elevated pulmonary artery systolic pressure. The estimated right ventricular systolic pressure is 39.4 mmHg.  7. The inferior vena cava is dilated in size with <50% respiratory variability, suggesting right atrial pressure of 15 mmHg. Comparison(s): Changes from prior study are noted. 07/14/2022: LVEF 40-45%, mid/distal lateral, mid/distal septal, distal inferior and apical walls. FINDINGS  Left Ventricle: Left ventricular ejection fraction, by estimation, is 30 to 35%. Left ventricular ejection fraction by 2D MOD biplane is 32.2 %. The left ventricle has moderately decreased function. The left ventricle demonstrates regional wall motion abnormalities. Severe hypokinesis of the left ventricular, mid-apical inferoseptal wall, septal wall, lateral wall and apical segment. The left ventricular internal cavity size was normal in size. There is no left ventricular hypertrophy. Diastolic function is indeterminate due to  lack of tissue doppler. Right Ventricle: There is mildly elevated pulmonary  artery systolic pressure. The tricuspid regurgitant velocity is 2.47 m/s, and with an assumed right atrial pressure of 15 mmHg, the estimated right ventricular systolic pressure is 39.4 mmHg. Pericardium: A small pericardial effusion is present. Mitral Valve: Moderate to severe mitral valve regurgitation, with centrally-directed jet. Tricuspid Valve: The tricuspid valve is grossly normal. Tricuspid valve regurgitation is mild. Aortic Valve: The aortic root is heaily calcified, but there does not appears to be significant aortic stenosis. Doppler velocities were not obtained. Venous: The inferior vena cava is dilated in size with less than 50% respiratory variability, suggesting right atrial pressure of 15 mmHg. Additional Comments: There is a small pleural effusion in the left lateral region. Spectral Doppler performed. Color Doppler performed.  LEFT VENTRICLE PLAX 2D                        Biplane EF (MOD) LVIDd:         4.70 cm         LV Biplane EF:   Left LVIDs:         3.90 cm                          ventricular LV PW:         0.80 cm                          ejection LV IVS:        0.60 cm                          fraction by                                                 2D MOD                                                 biplane is LV Volumes (MOD)                                32.2 %. LV vol d, MOD    134.0 ml A2C: LV vol d, MOD    108.0 ml A4C: LV vol s, MOD    83.3 ml A2C: LV vol s, MOD    76.3 ml A4C: LV SV MOD A2C:   50.7 ml LV SV MOD A4C:   108.0 ml LV SV MOD BP:    38.7 ml RIGHT VENTRICLE TAPSE (M-mode): 1.6 cm LEFT ATRIUM         Index LA diam:    3.50 cm 2.10 cm/m  MITRAL VALVE                TRICUSPID VALVE MV Area (PHT): 4.31 cm     TR Peak grad:   24.4 mmHg MV Decel Time: 176 msec     TR Vmax:        247.00 cm/s MR Peak grad: 65.6 mmHg MR Vmax:  405.00 cm/s MV E velocity: 164.00 cm/s MV A velocity: 73.10 cm/s MV E/A ratio:  2.24 Zoila Shutter MD Electronically signed by Zoila Shutter  MD Signature Date/Time: 11/01/2022/3:00:23 PM    Final    US THORACENTESIS ASP PLEURAL SPACE W/IMG GUIDE  Result Date: 11/01/2022 INDICATION: 82 year old female presents with shortness of breath, previous imaging showed bilateral pleural effusion right greater than left. Request for therapeutic and diagnostic thoracentesis. EXAM: ULTRASOUND GUIDED RIGHT THORACENTESIS MEDICATIONS: 5 mL 1% lidocaine COMPLICATIONS: None immediate. PROCEDURE: An ultrasound guided thoracentesis was thoroughly discussed with the patient and questions answered. The benefits, risks, alternatives and complications were also discussed. The patient understands and wishes to proceed with the procedure. Written consent was obtained. Ultrasound was performed to localize and mark an adequate pocket of fluid in the right chest. The area was then prepped and draped in the normal sterile fashion. 1% Lidocaine was used for local anesthesia. Under ultrasound guidance a 6 Fr Safe-T-Centesis catheter was introduced. Thoracentesis was performed. The catheter was removed and a dressing applied. FINDINGS: A total of approximately 1.1 L of hazy yellow fluid was removed. Samples were sent to the laboratory as requested by the clinical team. Postprocedure chest x-ray ordered, results pending. IMPRESSION: Successful ultrasound guided right thoracentesis yielding 1.1 L of pleural fluid. Performed by: Lawernce Ion, PA-C Electronically Signed   By: Judie Petit.  Shick M.D.   On: 11/01/2022 12:57   DG Chest 1 View  Result Date: 11/01/2022 CLINICAL DATA:  Status post right thoracentesis. EXAM: CHEST  1 VIEW COMPARISON:  Two-view chest x-ray 10/29/2022 FINDINGS: The heart is enlarged. The right pleural effusion is markedly decreased. No significant pneumothorax is present. A left pleural effusion remains. Left basilar airspace opacity is noted. Mild pulmonary vascular congestion is present. The visualized soft tissues and bony thorax are unremarkable. IMPRESSION: 1. Marked  decrease in right pleural effusion without significant pneumothorax. 2. Persistent left pleural effusion and basilar airspace disease. 3. Cardiomegaly and mild pulmonary vascular congestion. Electronically Signed   By: Marin Roberts M.D.   On: 11/01/2022 12:08   CT CHEST WO CONTRAST  Result Date: 10/31/2022 CLINICAL DATA:  Diffuse/interstitial lung disease EXAM: CT CHEST WITHOUT CONTRAST TECHNIQUE: Multidetector CT imaging of the chest was performed following the standard protocol without IV contrast. RADIATION DOSE REDUCTION: This exam was performed according to the departmental dose-optimization program which includes automated exposure control, adjustment of the mA and/or kV according to patient size and/or use of iterative reconstruction technique. COMPARISON:  Chest radiograph 10/29/2022 FINDINGS: Cardiovascular: Aortic atherosclerosis without aneurysm. The heart is enlarged. There are coronary artery calcifications. No pericardial effusion. Decreased density of the blood pool suggests anemia. Mediastinum/Nodes: Shotty mediastinal nodes including an 11 mm anterior paratracheal node. Hilar assessment is limited in the absence of IV contrast. Esophagus is grossly normal, partially obscured by adjacent pleural effusions. No thyroid nodule. Lungs/Pleura: Moderate to large right and moderate left pleural effusion. Assessment of lung parenchyma and interstitial lung disease is limited in the setting of pleural effusions and motion artifact. Subtotal atelectasis and opacification of both lower lobes, compressive atelectasis within the dependent right upper lobe. Additional ground-glass opacities in both lower lobes in the periphery of the right upper lobe. No bronchiectasis. Trachea and central airways are clear. Upper Abdomen: Nodular hepatic contour suggestive of cirrhosis. No acute upper abdominal findings. Musculoskeletal: There are no acute or suspicious osseous abnormalities. No chest wall soft tissue  abnormalities. IMPRESSION: 1. Moderate to large right and moderate left pleural effusion. Subtotal atelectasis  and opacification of both lower lobes, compressive atelectasis within the dependent right upper lobe. Additional ground-glass opacities in both lower lobes in the periphery of the right upper lobe. Findings may be combination of compressive atelectasis and pulmonary edema or infection. 2. Cardiomegaly with coronary artery calcifications. 3. Nodular hepatic contour suggestive of cirrhosis. 4. Shotty mediastinal nodes are likely reactive. Aortic Atherosclerosis (ICD10-I70.0). Electronically Signed   By: Narda Rutherford M.D.   On: 10/31/2022 16:14    Cardiac Studies     Patient Profile     82 y.o. female with a hx of PAD, right carotid artery disease,  hypertension, hyperlipidemia, GIB with GAVE/gastritis, CKD IIIb, arthritis, GERD, hypothyroidism, CVA, OSA, DM, cirrhosis who is being seen for the evaluation of acute congestive heart failure   Assessment & Plan    Acute on chronic combined heart failure: Patient admitted 8/22 for management of recurrent heart failure symptoms including nocturnal dyspnea, exertional intolerance, lower extremity edema.  At time of admission, patient taking Lasix 40 mg p.o. daily, metoprolol succinate 25 mg, Jardiance 25mg .  She did see [facility] cardiology on 8/6 but initiation of ACE/ARB/ARNI deferred.  Recent outpatient nuclear stress test with no convincing evidence of inducible ischemia.  Medium sized fixed defect of the apex with associated akinesis consistent with prior myocardial infarction.  Most recent echo shows LVEF of 40 to 45% with anterolateral hypokinesis. Prior echo in May of this year also found LVEF 40-45% with similar apical septal akinesis. LVEF 60-65% 02/24.  BNP this admission 3983.9.  Chest x-ray with bilateral pleural effusions and adjacent opacities. Patient status post IV Lasix 40 mg once 8/21 and again on 8/22.  Net output reported only as  344 mL. Lasix held today with creatinine rising 1.89->2.10->2.28. Patient with improvement in peripheral edema following diuresis but still has physical exam and imaging evidence of vascular congestion/volume overload.  Overall challenging situation. Patient is very frail following several years spent primarily in bed. While she has never undergone coronary angiography, her recent stress test is consistent with prior infarct. Additionally CT chest shows impressive coronary artery calcifications, unsurprising given peripheral vascular disease history. She also has a rather large right side pleural effusion even with diuresis. Additional contributing factors to her clinical condition include likely malnutrition (son reports very poor appetite) and anemia (HGB 7.5).  Status post thoracentesis 8/24, 1.2 L removed Echo shows worsening LV systolic function, now 30 to 35%.  Given frailty and co-morbidities, we will be limited in medical management of CHF. Agree with involving palliative care.  No room to add GDMT given soft BP Low albumin likely contributing to volume overload.  Received albumin infusion 8/24 Appears volume overloaded (+JVD, crackles on exam), would continue IV Lasix.  Renal function and hyponatremia improving, will monitor closely   Coronary artery calcifications: Patient with CT chest this admission with significant coronary artery calcification. Unsurprising with her peripheral vascular disease history. Recent outpatient nuclear stress test indicated medium fixed defect of apex with akinesis but no reversible defect. With AKI on CKD IV and overall frailty, medical management at this time.  Continue ASA 81mg  LDL 57 as of May 2024. Continue Zetia 10mg  and Fenofibrate 160mg  (statin intolerant).    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  Kyler remotely followed with Dr. Allyson Sabal for h/o PAD with prior bilateral LE interventions with procedures back in  2015, 2016, and 2018. She was previously referred to vascular surgery for consideration of left fem-pop but felt to be poor candidate due to lack of venous conduit. Last angiography was in 2018 with angioplasty of the right SFA CTO. She had been maintained on Plavix, also placed on Pletal in 2022. 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 Readmitted in May with anemia again and ultimately taken of P2Y12. ASA only.  Continue with ASA 81mg , Zetia, Fenofibrate.   Anemia Hemoglobin down to 7.7, would monitor may have benefit from transfusion to maintain hemoglobin greater than 8  For questions or updates, please contact [facility] Please consult [email or web address] for contact info under        Signed, Little Ishikawa, MD  11/02/2022, 9:08 AM

## 2022-11-02 NOTE — Assessment & Plan Note (Signed)
Iron 39, tibc 405, transferrin saturation 10. Consistent with anemia of iron deficiency combined with anemia of chronic disease. Will add one dose of IV iron.

## 2022-11-02 NOTE — Evaluation (Signed)
Physical Therapy Evaluation Patient Details Name: Jessica Torres MRN: [id number] DOB: 1941-02-10 Today's Date: 11/02/2022  History of Present Illness  Pt is a 82 y.o. female admitted 8/21 with CHF. She underwent R thoracentesis 8/24. 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 PTA pt lived at home with her sister, amb household distances with RW mod I but primarily sedentary. Pt currently with functional limitations due to the deficits listed below (see PT Problem List). On eval, she required CGA transfers and amb 200' with RW. Desat to 87% on RA. Able to maintain SpO2 in 90s on 1-2L. Pt will benefit from acute skilled PT to increase their independence and safety with mobility to allow discharge.  Pt would benefit from HHPT.          If plan is discharge home, recommend the following: A little help with walking and/or transfers;A little help with bathing/dressing/bathroom;Assistance with cooking/housework;Assist for transportation;Help with stairs or ramp for entrance   Can travel by private vehicle        Equipment Recommendations Other (comment) (home O2)  Recommendations for Other Services       Functional Status Assessment Patient has had a recent decline in their functional status and demonstrates the ability to make significant improvements in function in a reasonable and predictable amount of time.     Precautions / Restrictions Precautions Precautions: Other (comment) Precaution Comments: watch sats      Mobility  Bed Mobility               General bed mobility comments: Pt up in recliner.    Transfers Overall transfer level: Needs assistance Equipment used: Rolling walker (2 wheels) Transfers: Sit to/from Stand Sit to Stand: Contact guard assist           General transfer comment: cues for hand placement and sequencing,  increased time    Ambulation/Gait Ambulation/Gait assistance: Contact guard assist Gait Distance (Feet): 200 Feet Assistive device: Rolling walker (2 wheels) Gait Pattern/deviations: Step-through pattern, Decreased stride length Gait velocity: decreased Gait velocity interpretation: <1.31 ft/sec, indicative of household ambulator   General Gait Details: Amb on RA with desat to 87%. 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, music Rankin (Stroke Patients Only)       Balance Overall balance assessment: Needs assistance Sitting-balance support: No upper extremity supported, Feet supported Sitting balance-Leahy Scale: Good     Standing balance support: During functional activity, Reliant on assistive device for balance, Bilateral upper extremity supported Standing balance-Leahy Scale: Poor                               Pertinent Vitals/Pain Pain Assessment Pain Assessment: Faces Faces Pain Scale: Hurts a little bit Pain Location: generalized/stiffness Pain Descriptors / Indicators: Discomfort, Grimacing Pain Intervention(s): Monitored during session, Repositioned    Home Living Family/patient expects to be discharged to:: Private residence Living Arrangements: Children Available Help at Discharge: Family;Available 24 hours/day Type of Home: House         Home Layout: One level Home Equipment: Agricultural consultant (2 wheels) Additional Comments: active with HH at time of admission    Prior Function Prior Level of Function : Needs assist  Mobility Comments: amb household distances with RW       Extremity/Trunk Assessment   Upper Extremity Assessment Upper Extremity Assessment: Defer to OT evaluation    Lower Extremity Assessment Lower Extremity Assessment: Generalized weakness    Cervical / Trunk Assessment Cervical / Trunk Assessment: Kyphotic  Communication   Communication Communication: No  apparent difficulties  Cognition Arousal: Alert Behavior During Therapy: WFL for tasks assessed/performed Overall Cognitive Status: Within Functional Limits for tasks assessed                                          General Comments General comments (skin integrity, edema, etc.): SpO2 100% at rest on 1L. Desat to 87% during amb on RA.    Exercises     Assessment/Plan    PT Assessment Patient needs continued PT services  PT Problem List Decreased strength;Decreased balance;Decreased mobility;Decreased knowledge of use of DME;Decreased activity tolerance;Cardiopulmonary status limiting activity       PT Treatment Interventions DME instruction;Functional mobility training;Balance training;Patient/family education;Gait training;Therapeutic activities;Therapeutic exercise;Stair training    PT Goals (Current goals can be found in the Care Plan section)  Acute Rehab PT Goals Patient Stated Goal: home PT Goal Formulation: With patient/family Time For Goal Achievement: 11/16/22 Potential to Achieve Goals: Fair    Frequency Min 1X/week     Co-evaluation               AM-PAC PT "6 Clicks" Mobility  Outcome Measure Help needed turning from your back to your side while in a flat bed without using bedrails?: A Little Help needed moving from lying on your back to sitting on the side of a flat bed without using bedrails?: A Little Help needed moving to and from a bed to a chair (including a wheelchair)?: A Little Help needed standing up from a chair using your arms (e.g., wheelchair or bedside chair)?: A Little Help needed to walk in hospital room?: A Little Help needed climbing 3-5 steps with a railing? : A Lot 6 Click Score: 17    End of Session Equipment Utilized During Treatment: Gait belt;Oxygen Activity Tolerance: Patient tolerated treatment well Patient left: in chair;with call bell/phone within reach;with family/visitor present Nurse Communication:  Mobility status PT Visit Diagnosis: Difficulty in walking, not elsewhere classified (R26.2);Muscle weakness (generalized) (M62.81)    Time: 1610-9604 PT Time Calculation (min) (ACUTE ONLY): 39 min   Charges:   PT Evaluation $PT Eval Moderate Complexity: 1 Mod PT Treatments $Gait Training: 8-22 mins PT General Charges $$ ACUTE PT VISIT: 1 Visit         Ferd Glassing., PT  Office # [phone]   Ilda Foil 11/02/2022, 1:54 PM

## 2022-11-02 NOTE — Progress Notes (Signed)
SATURATION QUALIFICATIONS: (This note is used to comply with regulatory documentation for home oxygen)  Patient Saturations on Room Air at Rest = 94%  Patient Saturations on Room Air while Ambulating = 87%  Patient Saturations on 2 Liters of oxygen while Ambulating = 97%  Please briefly explain why patient needs home oxygen: Pt required supplemental O2 to maintain SpO2 > 90% during activity.  Ferd Glassing., PT  Office # [phone]

## 2022-11-02 NOTE — Progress Notes (Signed)
Progress Note   [name] HQI:[id number] DOB: Feb 03, 1941 DOA: 10/29/2022     4 DOS: the patient was seen and examined on 11/02/2022   Brief hospital course: Mrs. Bob is a 82 y.o. 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  In the ER, CXR with effusions, edema and BNP >2000   8/21: Admitted on IV Lasix 8/22: Improving and off O2, still symptomatic 8/23: Breathing worse, cough worse, now requiring 1-2L O2; Cardiology consulted; CT chest showed large effusion 08/24 IV furosemide with IV albumin.  08/25 improving volume status, echocardiogram with reduction in LV systolic function.   Assessment and Plan: * Acute on chronic combined systolic and diastolic CHF (congestive heart failure) (HCC) Echocardiogram with reduction of LV systolic function down to 30 to 35% (worsening from 05/24), severe hypokinesis of the left ventricular mid apical inferoseptal wall, septal wall, lateral wall and apical segment. Small pericardial effusion, moderate to severe mitral regurgitation, RVSP 39.4   Urine output 750 ml,  Systolic blood pressure 89 to 132 mmHg.   Plan to continue diuresis with furosemide 80 mg IV q12  Albumin to improve bioavailability of furosemide.   Continue metoprolol.  Limited pharmacologic options due to reduced GFR and risk of hypotension.  Holding RAAS inhibition and SGLT 2 inh.   Acute hypoxemic respiratory failure, due to acute cardiogenic pulmonary edema. Right pleural effusion sp thoracentesis, 1.2  L removed. Transudate.  02 saturation today is 94% on 2 L.min per [place].   Acute kidney injury on chronic kidney disease stage IV (HCC) Cardiorenal syndrome, hyponatremia.   Today renal function with serum cr at 2,13 with K at 3,5 and serum bicarbonate at 25. Na 131 and Mg 2.2   Plan to continue diuresis  with furosemide IV 80 mg q12 hrs.  Add 40 meq Kcl to prevent hypokalemia.  Follow up renal function in am.   Type 2 diabetes mellitus with hyperlipidemia (HCC) Continue insulin sliding scale for glucose cover and monitoring.  Fasting glucose this am 116   Continue fenofibrate and ezetimibe.   GAVE (gastric antral vascular ectasia) Continue with pantoprazole.   History of CVA (cerebrovascular accident) - Continue aspirin, Zetia, fibrate  Hypothyroidism TSH normal at [facility] this month. - Continue levothyroxine  Anemia Iron 39, tibc 405, transferrin saturation 10. Consistent with anemia of iron deficiency combined with anemia of chronic disease. Will add one dose of IV iron.           Subjective: Patient is feeling better but not yet back to her baseline, no chest pain, has dyspnea with exertion  Physical Exam: Vitals:   11/01/22 2049 11/01/22 2356 11/02/22 0450 11/02/22 0735  BP: (!) 109/48 (!) 112/43  (!) 102/40  Pulse:  79  75  Resp: 14   18  Temp: 98.4 F (36.9 C) 98.9 F (37.2 C) 98.1 F (36.7 C) 98.7 F (37.1 C)  TempSrc: Oral Oral Oral Oral  SpO2: 96% 96%  94%  Weight:      Height:       Neurology awake and alert, deconditioned ENT with mild pallor Cardiovascular with S1 and S2 present and regular with systolic murmur at the apex, no gallops. Respiratory with mild rales bilaterally with no rhonchi or wheezing Abdomen with no distention  No lower extremity edema  Data Reviewed:    Family Communication:  no family at the bedside   Disposition: Status is: Inpatient Remains inpatient appropriate because: diuresis   Planned Discharge Destination: Home      Author: Coralie Keens, MD 11/02/2022 10:57 AM  For on call review [email or web address].

## 2022-11-02 NOTE — Progress Notes (Signed)
Initial Nutrition Assessment  DOCUMENTATION CODES:   Not applicable  INTERVENTION:  - Continue Regular diet.   - Add Ensure Enlive po BID, each supplement provides 350 kcal and 20 grams of protein.  NUTRITION DIAGNOSIS:   Increased nutrient needs related to chronic illness as evidenced by estimated needs.  GOAL:   Patient will meet greater than or equal to 90% of their needs  MONITOR:   PO intake, Supplement acceptance  REASON FOR ASSESSMENT:   Consult Assessment of nutrition requirement/status  ASSESSMENT:   82 y.o. female admits related to cough. PMH includes: PAD with claudication, NSTEMI, cirrhosis, T2DM, hypothyroidism, CKD stage 3, iron deficiency anemia. Pt is currently receiving medical management related to acute on chronic systolic CHF.  Meds reviewed: colace, lasix, sliding scale insulin, synthroid, miralax, klor-con. Labs reviewed: Na low, BUN/Creatinine elevated.   RD unable to reach pt via phone. Pt is on a Regular diet. Per record, pt has eaten 75-100% of her meals today. No significant wt loss per record. RD will add Ensure BID for now and continue to monitor PO intakes.   NUTRITION - FOCUSED PHYSICAL EXAM:  Remote assessment.  Diet Order:   Diet Order             Diet regular Fluid consistency: Thin  Diet effective now                   EDUCATION NEEDS:   Not appropriate for education at this time  Skin:  Skin Assessment: Reviewed RN Assessment  Last BM:  11/01/22  Height:   Ht Readings from Last 1 Encounters:  10/29/22 5\' 5"  (1.651 m)    Weight:   Wt Readings from Last 1 Encounters:  11/01/22 60.6 kg    Ideal Body Weight:     BMI:  Body mass index is 22.23 kg/m.  Estimated Nutritional Needs:   Kcal:  1500-1800 kcals  Protein:  75-90 gm  Fluid:  1.5-1.8 L  Bethann Humble, RD, LDN, CNSC.

## 2022-11-03 ENCOUNTER — Inpatient Hospital Stay (HOSPITAL_COMMUNITY): Payer: Medicare HMO

## 2022-11-03 DIAGNOSIS — K31819 Angiodysplasia of stomach and duodenum without bleeding: Secondary | ICD-10-CM | POA: Diagnosis not present

## 2022-11-03 DIAGNOSIS — E1169 Type 2 diabetes mellitus with other specified complication: Secondary | ICD-10-CM | POA: Diagnosis not present

## 2022-11-03 DIAGNOSIS — I5043 Acute on chronic combined systolic (congestive) and diastolic (congestive) heart failure: Secondary | ICD-10-CM | POA: Diagnosis not present

## 2022-11-03 DIAGNOSIS — N179 Acute kidney failure, unspecified: Secondary | ICD-10-CM | POA: Diagnosis not present

## 2022-11-03 LAB — BASIC METABOLIC PANEL
Anion gap: 9 (ref 5–15)
BUN: 34 mg/dL — ABNORMAL HIGH (ref 8–23)
CO2: 28 mmol/L (ref 22–32)
Calcium: 9 mg/dL (ref 8.9–10.3)
Chloride: 96 mmol/L — ABNORMAL LOW (ref 98–111)
Creatinine, Ser: 2.19 mg/dL — ABNORMAL HIGH (ref 0.44–1.00)
GFR, Estimated: 22 mL/min — ABNORMAL LOW (ref >60)
Glucose, Bld: 134 mg/dL — ABNORMAL HIGH (ref 70–99)
Potassium: 3.3 mmol/L — ABNORMAL LOW (ref 3.5–5.1)
Sodium: 133 mmol/L — ABNORMAL LOW (ref 135–145)

## 2022-11-03 LAB — GLUCOSE, CAPILLARY
Glucose-Capillary: 117 mg/dL — ABNORMAL HIGH (ref 70–99)
Glucose-Capillary: 225 mg/dL — ABNORMAL HIGH (ref 70–99)
Glucose-Capillary: 255 mg/dL — ABNORMAL HIGH (ref 70–99)
Glucose-Capillary: 212 mg/dL — ABNORMAL HIGH (ref 70–99)

## 2022-11-03 LAB — MAGNESIUM: Magnesium: 2.3 mg/dL (ref 1.7–2.4)

## 2022-11-03 LAB — PATHOLOGIST SMEAR REVIEW: Path Review: NEGATIVE

## 2022-11-03 MED ORDER — POTASSIUM CHLORIDE CRYS ER 20 MEQ PO TBCR
40.0000 meq | EXTENDED_RELEASE_TABLET | Freq: Once | ORAL | Status: AC
  Administered 2022-11-03: 40 meq via ORAL
  Filled 2022-11-03: qty 2

## 2022-11-03 NOTE — Evaluation (Signed)
Occupational Therapy Evaluation Patient Details Name: Jessica Torres MRN: [id number] DOB: November 01, 1940 Today's Date: 11/03/2022   History of Present Illness Pt is a 82 y.o. female admitted 8/21 with CHF. She underwent R thoracentesis 8/24. 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  PTA patient lived at home with family and had any assist as needed.  Patient stating she was able to complete her own ADL, and did walk household distances with RW and O2.  Patient presents with deficits listed below, and currently needs up to Min A for in room mobility and lower body ADL.  OT will follow in the acute setting to address deficits, and HH OT can be considered for home.         If plan is discharge home, recommend the following: Assist for transportation;Assistance with cooking/housework;A little help with bathing/dressing/bathroom;A little help with walking and/or transfers    Functional Status Assessment  Patient has had a recent decline in their functional status and demonstrates the ability to make significant improvements in function in a reasonable and predictable amount of time.  Equipment Recommendations  None recommended by OT    Recommendations for Other Services       Precautions / Restrictions Precautions Precautions: Fall Precaution Comments: watch sats Restrictions Weight Bearing Restrictions: No      Mobility Bed Mobility Overal bed mobility: Needs Assistance Bed Mobility: Supine to Sit     Supine to sit: Min assist       Patient Response: Cooperative  Transfers Overall transfer level: Needs assistance Equipment used: Rolling walker (2 wheels) Transfers: Sit to/from Stand Sit to Stand: Contact guard assist                  Balance Overall balance assessment: Needs assistance Sitting-balance support: No upper  extremity supported, Feet supported Sitting balance-Leahy Scale: Good     Standing balance support: Reliant on assistive device for balance Standing balance-Leahy Scale: Poor                             ADL either performed or assessed with clinical judgement   ADL Overall ADL's : Needs assistance/impaired Eating/Feeding: Set up;Sitting   Grooming: Wash/dry hands;Wash/dry face;Set up;Sitting   Upper Body Bathing: Set up;Sitting   Lower Body Bathing: Minimal assistance;Sit to/from stand   Upper Body Dressing : Set up;Sitting   Lower Body Dressing: Minimal assistance;Sit to/from stand   Toilet Transfer: Minimal assistance;Rolling walker (2 wheels)   Toileting- Clothing Manipulation and Hygiene: Contact guard assist;Sit to/from stand               Vision Patient Visual Report: No change from baseline       Perception Perception: Within Functional Limits       Praxis Praxis: WFL       Pertinent Vitals/Pain Pain Assessment Faces Pain Scale: Hurts a little bit Pain Location: generalized/stiffness Pain Descriptors / Indicators: Discomfort, Grimacing Pain Intervention(s): Monitored during session     Extremity/Trunk Assessment Upper Extremity Assessment Upper Extremity Assessment: Generalized weakness   Lower Extremity Assessment Lower Extremity Assessment: Defer to PT evaluation   Cervical / Trunk Assessment Cervical / Trunk Assessment: Kyphotic   Communication Communication Communication: No apparent difficulties   Cognition Arousal: Alert Behavior During Therapy: WFL for tasks assessed/performed Overall Cognitive Status:  Within Functional Limits for tasks assessed                                       General Comments   VSS on O2    Exercises     Shoulder Instructions      Home Living Family/patient expects to be discharged to:: Private residence Living Arrangements: Children Available Help at Discharge:  Family;Available 24 hours/day Type of Home: House       Home Layout: One level     Bathroom Shower/Tub: Tub/shower unit;Walk-in shower   Bathroom Toilet: Standard     Home Equipment: Agricultural consultant (2 wheels)          Prior Functioning/Environment Prior Level of Function : Needs assist             Mobility Comments: amb household distances with RW ADLs Comments: Mod I        OT Problem List: Decreased strength;Decreased activity tolerance;Impaired balance (sitting and/or standing)      OT Treatment/Interventions: Self-care/ADL training;Energy conservation;Patient/family education;Balance training;DME and/or AE instruction    OT Goals(Current goals can be found in the care plan section) Acute Rehab OT Goals Patient Stated Goal: Return home OT Goal Formulation: With patient Time For Goal Achievement: 11/17/22 Potential to Achieve Goals: Good ADL Goals Pt Will Perform Grooming: with modified independence;standing Pt Will Perform Upper Body Dressing: with set-up;sitting Pt Will Perform Lower Body Dressing: with set-up;sit to/from stand Pt Will Transfer to Toilet: with modified independence;regular height toilet;ambulating  OT Frequency: Min 1X/week    Co-evaluation              AM-PAC OT "6 Clicks" Daily Activity     Outcome Measure Help from another person eating meals?: None Help from another person taking care of personal grooming?: None Help from another person toileting, which includes using toliet, bedpan, or urinal?: A Little Help from another person bathing (including washing, rinsing, drying)?: A Little Help from another person to put on and taking off regular upper body clothing?: None Help from another person to put on and taking off regular lower body clothing?: A Little 6 Click Score: 21   End of Session Equipment Utilized During Treatment: Rolling walker (2 wheels);Oxygen Nurse Communication: Mobility status  Activity Tolerance: Patient  tolerated treatment well Patient left: in chair;with call bell/phone within reach;with family/visitor present  OT Visit Diagnosis: Unsteadiness on feet (R26.81);Muscle weakness (generalized) (M62.81)                Time: 1324-4010 OT Time Calculation (min): 27 min Charges:  OT General Charges $OT Visit: 1 Visit OT Evaluation $OT Eval Moderate Complexity: 1 Mod OT Treatments $Self Care/Home Management : 8-22 mins  11/03/2022  RP, OTR/L  Acute Rehabilitation Services  Office:  [phone]   Suzanna Obey 11/03/2022, 11:18 AM

## 2022-11-03 NOTE — Progress Notes (Signed)
Progress Note   [name] [id number]:[id number] DOB: [birth year] DOA: 10/29/2022     5 DOS: the patient was seen and examined on 11/03/2022   Brief hospital course: Mrs. Mccallie was admitted to the hospital with the working diagnosis of heart failure exacerbation.    82 y.o. 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  In the ER, CXR with effusions, edema and BNP >2000   8/21: Admitted on IV Lasix 8/22: Improving and off O2, still symptomatic 8/23: Breathing worse, cough worse, now requiring 1-2L O2; Cardiology consulted; CT chest showed large effusion 08/24 IV furosemide with IV albumin.  08/25 improving volume status, echocardiogram with reduction in LV systolic function.  08/26 continue with hypervolemia.   Assessment and Plan: * Acute on chronic combined systolic and diastolic CHF (congestive heart failure) (HCC) Echocardiogram with reduction of LV systolic function down to 30 to 35% (worsening from 05/24), severe hypokinesis of the left ventricular mid apical inferoseptal wall, septal wall, lateral wall and apical segment. Small pericardial effusion, moderate to severe mitral regurgitation, RVSP 39.4   Urine output 1,350 ml,  Systolic blood pressure 116 to 117 mmHg.   Plan to continue diuresis with furosemide 80 mg IV q12  Albumin to improve bioavailability of furosemide.   Continue metoprolol.  Limited pharmacologic options due to reduced GFR and risk of hypotension.  Holding RAAS inhibition and SGLT 2 inh.   Acute hypoxemic respiratory failure, due to acute cardiogenic pulmonary edema. Right pleural effusion sp thoracentesis, 1.2  L removed. Transudate.  02 saturation today is 92% on 2 L.min per [place].  Chest radiograph with left pleural effusion, will do a repeat lateral film if enough fluid will proceed with  thoracentesis.   Acute kidney injury on chronic kidney disease stage IV (HCC) Cardiorenal syndrome, hyponatremia, hypokalemia.   Renal function with serum cr at 2,19 with K at 3,3 and serum bicarbonate at 28 Na 133. Mg 2.3   Plan to continue diuresis with furosemide IV 80 mg q12 hrs.  Had IV albumin to improved furosemide bioavailability.  Add 40 meq Kcl x2.  Follow up renal function in am.   Type 2 diabetes mellitus with hyperlipidemia (HCC) Continue insulin sliding scale for glucose cover and monitoring.  Fasting glucose this am 134 mg/dl.   Continue fenofibrate and ezetimibe.   GAVE (gastric antral vascular ectasia) Continue with pantoprazole.   History of CVA (cerebrovascular accident) - Continue aspirin, Zetia, fibrate  Hypothyroidism TSH normal at [facility] this month. - Continue levothyroxine  Anemia Iron 39, tibc 405, transferrin saturation 10. Consistent with anemia of iron deficiency combined with anemia of chronic disease. SP IV iron infusion.           Subjective: Patient with persistent dyspnea and cough, no chest pain, very weak and deconditioned   Physical Exam: Vitals:   11/02/22 1941 11/03/22 0011 11/03/22 0401 11/03/22 0854  BP: 114/60 (!) 108/39 (!) 117/49   Pulse: 83 85 89 90  Resp: 18 20 (!) 24   Temp: 98.2 F (36.8 C)  98.6 F (37 C) 98.4 F (36.9 C)  TempSrc: Oral Oral Oral Oral  SpO2: 99% 98% 97% 92%  Weight:   56.4 kg   Height:       Neurology awake and alert ENT with mild pallor Cardiovascular with S1 and S2 present and regular,  positive JVD, no lower extremity edema Respiratory with rales bilaterally with no wheezing Abdomen with no distention  Data Reviewed:    Family Communication: I spoke with patient's sister at the bedside, we talked in detail about patient's condition, plan of care and prognosis and all questions were addressed.   Disposition: Status is: Inpatient Remains inpatient appropriate because: IV diuresis    Planned Discharge Destination: Home    Author: Coralie Keens, MD 11/03/2022 4:11 PM  For on call review [email or web address].

## 2022-11-03 NOTE — Progress Notes (Signed)
   Heart Failure Stewardship Pharmacist Progress Note   PCP: Rebekah Chesterfield, NP PCP-Cardiologist: Dietrich Pates, MD    HPI:  82 yo F with PMH of CHF, CAD, PAD, GAVE, T2DM, hypothyroidism, CKD IIIb, and anemia.   She was recently hospitalized at [facility] for acute on chronic systolic CHF. EF 40-45%. Difficulty with diuresis with hypotension. Her metoprolol was changed to carvedilol. However, at follow up with cardiology, carvedilol was switched back to metoprolol XL.   She presented to the ED on 8/21 with continued shortness of breath, LE edema, orthopnea, and cough. CXR with worsening bilateral pleural effusions with opacities. BNP 3983. ECHO 8/24 showed EF worsening to 30-35%, RWMA, moderate to severe MR. S/p successful R thoracentesis on 8/24, yielding 1.2L of fluid. Palliative consulted - patient and family wish to continue current scope of treatment with home health and PT. Repeating CXR for possible L thoracentesis.   Current HF Medications: Diuretic: furosemide 80 mg IV BID  Prior to admission HF Medications: Diuretic: furosemide 20 mg daily Beta blocker: carvedilol 3.125 mg BID AND metoprolol XL 25 mg daily on profile SGLT2i: Farxiga 10 mg daily AND Jardiance 10 mg daily on profile  Pertinent Lab Values: Serum creatinine 2.19, BUN 34, Potassium 3.3, Sodium 133, Magnesium 2.3, BNP 3983.8  Vital Signs: Weight: 124 lbs (admission weight: 125 lbs) Blood pressure: 110/40s  Heart rate: 80-90s  I/O: net -0.7L yesterday; net -1.9L since admission  Medication Assistance / Insurance Benefits Check: Does the patient have prescription insurance?  Yes Type of insurance plan: [employer] Medicare  Outpatient Pharmacy:  Prior to admission outpatient [facility]: [facility] Is the patient willing to use [facility] TOC pharmacy at discharge? Yes Is the patient willing to transition their outpatient pharmacy to utilize a [facility] outpatient pharmacy?   No - gets free mail delivery service with [facility]     Assessment: 1. Acute on chronic systolic CHF (LVEF 40-45%). NYHA class II symptoms. - Continue furosemide 80 mg IV BID. Still complaining of shortness of breath at night. Strict I/Os and daily weights. Keep K>4 and Mg>2. KCl 40 mEq x 1 given for replacement.  - Stopped metoprolol on 8/25 with hypotension, hold on adding ARB or MRA - Caution SGLT2i with limited mobility - reports she was mostly bedbound at home  Plan: 1) Medication changes recommended at this time: - Discontinue duplicate BB and SGLT2i at discharge  2) Patient assistance: - None pending  3)  Education  - Patient has been educated on current HF medications and potential additions to HF medication regimen - Patient verbalizes understanding that over the next few months, these medication doses may change and more medications may be added to optimize HF regimen - Patient has been educated on basic disease state pathophysiology and goals of therapy   Sharen Hones, PharmD, BCPS Heart Failure Stewardship Pharmacist Phone [phone]

## 2022-11-03 NOTE — Progress Notes (Signed)
Progress Note  Patient Name: Jessica Torres Date of Encounter: 11/03/2022  Primary Cardiologist:   Dietrich Pates, MD   Subjective   She reports having increased SOB with coughing last night.  Anxious this morning.   Inpatient Medications    Scheduled Meds:  aspirin  81 mg Oral Daily   docusate sodium  100 mg Oral Daily   enoxaparin (LOVENOX) injection  30 mg Subcutaneous Q24H   ezetimibe  10 mg Oral Daily   feeding supplement  237 mL Oral BID BM   fenofibrate  160 mg Oral Daily   furosemide  80 mg Intravenous BID   insulin aspart  0-9 Units Subcutaneous TID WC   levothyroxine  88 mcg Oral QAC breakfast   pantoprazole  40 mg Oral BID   polyethylene glycol  17 g Oral Daily   potassium chloride  40 mEq Oral Once   Continuous Infusions:  PRN Meds: guaiFENesin-dextromethorphan, ondansetron (ZOFRAN) IV, mouth rinse   Vital Signs    Vitals:   11/02/22 1620 11/02/22 1941 11/03/22 0011 11/03/22 0401  BP: (!) 103/51 114/60 (!) 108/39 (!) 117/49  Pulse: 77 83 85 89  Resp: 19 18 20  (!) 24  Temp: 98.5 F (36.9 C) 98.2 F (36.8 C)  98.6 F (37 C)  TempSrc: Oral Oral Oral Oral  SpO2: 95% 99% 98% 97%  Weight:    56.4 kg  Height:        Intake/Output Summary (Last 24 hours) at 11/03/2022 0756 Last data filed at 11/03/2022 0405 Gross per 24 hour  Intake 520 ml  Output 1350 ml  Net -830 ml   Filed Weights   10/31/22 0306 11/01/22 0437 11/03/22 0401  Weight: 58.7 kg 60.6 kg 56.4 kg    Telemetry   NSR - Personally Reviewed  ECG    NA - Personally Reviewed  Physical Exam   GEN: No acute distress.   Neck: No  JVD Cardiac: RRR, no murmurs, rubs, or gallops.  Respiratory:    Decreased breath sounds at bilateral bases with dullness to percussion left greater than right.  GI: Soft, nontender, non-distended  MS: Trace edema; No deformity. Neuro:  Nonfocal  Psych: Normal affect  Labs    Chemistry Recent Labs  Lab 11/01/22 0340 11/02/22 0432 11/03/22 0423  NA  128* 131* 133*  K 4.1 3.5 3.3*  CL 96* 97* 96*  CO2 23 25 28   GLUCOSE 148* 116* 134*  BUN 32* 35* 34*  CREATININE 2.39* 2.13* 2.19*  CALCIUM 8.7* 8.5* 9.0  PROT 7.1  --   --   ALBUMIN 2.6*  --   --   AST 50*  --   --   ALT 19  --   --   ALKPHOS 39  --   --   BILITOT 4.9*  --   --   GFRNONAA 20* 23* 22*  ANIONGAP 9 9 9      Hematology Recent Labs  Lab 10/30/22 0149 10/31/22 0442 11/01/22 0340  WBC 6.7 6.2 7.1  RBC 2.61* 2.55* 2.58*  HGB 7.7* 7.5* 7.7*  HCT 23.4* 23.4* 23.8*  MCV 89.7 91.8 92.2  MCH 29.5 29.4 29.8  MCHC 32.9 32.1 32.4  RDW 20.8* 20.4* 19.9*  PLT 209 202 211    Cardiac EnzymesNo results for input(s): "TROPONINI" in the last 168 hours. No results for input(s): "TROPIPOC" in the last 168 hours.   BNP Recent Labs  Lab 10/29/22 1222  BNP 3,983.8*     DDimer  No results for input(s): "DDIMER" in the last 168 hours.   Radiology    ECHOCARDIOGRAM LIMITED  Result Date: 11/01/2022    ECHOCARDIOGRAM LIMITED REPORT   Patient Name:   SHENITA SANDIFER Date of Exam: 11/01/2022 Medical Rec #:  [id number]      Height:       65.0 in Accession #:    [id number]     Weight:       133.6 lb Date of Birth:  [birth year]      BSA:          1.666 m Patient Age:    20 years       BP:           111/51 mmHg Patient Gender: F              HR:           79 bpm. Exam Location:  Inpatient Procedure: Limited Echo, Cardiac Doppler and Limited Color Doppler Indications:    CHF-Acute Systolic I50.21  History:        Patient has prior history of Echocardiogram examinations, most                 recent 07/14/2022. CAD and Previous Myocardial Infarction, PAD,                 Cirrhosis, Carotid Disease and Stroke, Signs/Symptoms:Murmur;                 Risk Factors:Dyslipidemia, Diabetes, Hypertension and                 Non-Smoker.  Sonographer:    Aron Baba Referring Phys: Little Ishikawa IMPRESSIONS  1. Limited study  2. Left ventricular ejection fraction, by estimation, is 30 to 35%. Left  ventricular ejection fraction by 2D MOD biplane is 32.2 %. The left ventricle has moderately decreased function. The left ventricle demonstrates regional wall motion abnormalities (see scoring diagram/findings for description). diastolic function is indeterminate due to lack of tissue doppler. There is severe hypokinesis of the left ventricular, mid-apical inferoseptal wall, septal wall, lateral wall and apical segment.  3. A small pericardial effusion is present.  4. Moderate to severe mitral valve regurgitation.  5. The aortic root is heaily calcified, but there does not appears to be significant aortic stenosis. Doppler velocities were not obtained.  6. There is mildly elevated pulmonary artery systolic pressure. The estimated right ventricular systolic pressure is 39.4 mmHg.  7. The inferior vena cava is dilated in size with <50% respiratory variability, suggesting right atrial pressure of 15 mmHg. Comparison(s): Changes from prior study are noted. 07/14/2022: LVEF 40-45%, mid/distal lateral, mid/distal septal, distal inferior and apical walls. FINDINGS  Left Ventricle: Left ventricular ejection fraction, by estimation, is 30 to 35%. Left ventricular ejection fraction by 2D MOD biplane is 32.2 %. The left ventricle has moderately decreased function. The left ventricle demonstrates regional wall motion abnormalities. Severe hypokinesis of the left ventricular, mid-apical inferoseptal wall, septal wall, lateral wall and apical segment. The left ventricular internal cavity size was normal in size. There is no left ventricular hypertrophy. Diastolic function is indeterminate due to lack of tissue doppler. Right Ventricle: There is mildly elevated pulmonary artery systolic pressure. The tricuspid regurgitant velocity is 2.47 m/s, and with an assumed right atrial pressure of 15 mmHg, the estimated right ventricular systolic pressure is 39.4 mmHg. Pericardium: A small pericardial effusion is present. Mitral Valve:  Moderate to severe mitral valve regurgitation, with centrally-directed  jet. Tricuspid Valve: The tricuspid valve is grossly normal. Tricuspid valve regurgitation is mild. Aortic Valve: The aortic root is heaily calcified, but there does not appears to be significant aortic stenosis. Doppler velocities were not obtained. Venous: The inferior vena cava is dilated in size with less than 50% respiratory variability, suggesting right atrial pressure of 15 mmHg. Additional Comments: There is a small pleural effusion in the left lateral region. Spectral Doppler performed. Color Doppler performed.  LEFT VENTRICLE PLAX 2D                        Biplane EF (MOD) LVIDd:         4.70 cm         LV Biplane EF:   Left LVIDs:         3.90 cm                          ventricular LV PW:         0.80 cm                          ejection LV IVS:        0.60 cm                          fraction by                                                 2D MOD                                                 biplane is LV Volumes (MOD)                                32.2 %. LV vol d, MOD    134.0 ml A2C: LV vol d, MOD    108.0 ml A4C: LV vol s, MOD    83.3 ml A2C: LV vol s, MOD    76.3 ml A4C: LV SV MOD A2C:   50.7 ml LV SV MOD A4C:   108.0 ml LV SV MOD BP:    38.7 ml RIGHT VENTRICLE TAPSE (M-mode): 1.6 cm LEFT ATRIUM         Index LA diam:    3.50 cm 2.10 cm/m  MITRAL VALVE                TRICUSPID VALVE MV Area (PHT): 4.31 cm     TR Peak grad:   24.4 mmHg MV Decel Time: 176 msec     TR Vmax:        247.00 cm/s MR Peak grad: 65.6 mmHg MR Vmax:      405.00 cm/s MV E velocity: 164.00 cm/s MV A velocity: 73.10 cm/s MV E/A ratio:  2.24 Zoila Shutter MD Electronically signed by Zoila Shutter MD Signature Date/Time: 11/01/2022/3:00:23 PM    Final    US THORACENTESIS ASP PLEURAL SPACE W/IMG GUIDE  Result Date: 11/01/2022 INDICATION: 82 year old female presents with shortness of breath, previous imaging  showed bilateral pleural effusion right  greater than left. Request for therapeutic and diagnostic thoracentesis. EXAM: ULTRASOUND GUIDED RIGHT THORACENTESIS MEDICATIONS: 5 mL 1% lidocaine COMPLICATIONS: None immediate. PROCEDURE: An ultrasound guided thoracentesis was thoroughly discussed with the patient and questions answered. The benefits, risks, alternatives and complications were also discussed. The patient understands and wishes to proceed with the procedure. Written consent was obtained. Ultrasound was performed to localize and mark an adequate pocket of fluid in the right chest. The area was then prepped and draped in the normal sterile fashion. 1% Lidocaine was used for local anesthesia. Under ultrasound guidance a 6 Fr Safe-T-Centesis catheter was introduced. Thoracentesis was performed. The catheter was removed and a dressing applied. FINDINGS: A total of approximately 1.1 L of hazy yellow fluid was removed. Samples were sent to the laboratory as requested by the clinical team. Postprocedure chest x-ray ordered, results pending. IMPRESSION: Successful ultrasound guided right thoracentesis yielding 1.1 L of pleural fluid. Performed by: Lawernce Ion, PA-C Electronically Signed   By: Judie Petit.  Shick M.D.   On: 11/01/2022 12:57   DG Chest 1 View  Result Date: 11/01/2022 CLINICAL DATA:  Status post right thoracentesis. EXAM: CHEST  1 VIEW COMPARISON:  Two-view chest x-ray 10/29/2022 FINDINGS: The heart is enlarged. The right pleural effusion is markedly decreased. No significant pneumothorax is present. A left pleural effusion remains. Left basilar airspace opacity is noted. Mild pulmonary vascular congestion is present. The visualized soft tissues and bony thorax are unremarkable. IMPRESSION: 1. Marked decrease in right pleural effusion without significant pneumothorax. 2. Persistent left pleural effusion and basilar airspace disease. 3. Cardiomegaly and mild pulmonary vascular congestion. Electronically Signed   By: Marin Roberts M.D.   On:  11/01/2022 12:08    Cardiac Studies   Echo:     1. Limited study   2. Left ventricular ejection fraction, by estimation, is 30 to 35%. Left  ventricular ejection fraction by 2D MOD biplane is 32.2 %. The left  ventricle has moderately decreased function. The left ventricle  demonstrates regional wall motion abnormalities  (see scoring diagram/findings for description). diastolic function is  indeterminate due to lack of tissue doppler. There is severe hypokinesis  of the left ventricular, mid-apical inferoseptal wall, septal wall,  lateral wall and apical segment.   3. A small pericardial effusion is present.   4. Moderate to severe mitral valve regurgitation.   5. The aortic root is heaily calcified, but there does not appears to be  significant aortic stenosis. Doppler velocities were not obtained.   6. There is mildly elevated pulmonary artery systolic pressure. The  estimated right ventricular systolic pressure is 39.4 mmHg.   7. The inferior vena cava is dilated in size with <50% respiratory  variability, suggesting right atrial pressure of 15 mmHg.    Patient Profile     82 y.o. female with a hx of PAD, right carotid artery disease,  hypertension, hyperlipidemia, GIB with GAVE/gastritis, CKD IIIb, arthritis, GERD, hypothyroidism, CVA, OSA, DM, cirrhosis who is being seen for the evaluation of acute congestive heart failure     Assessment & Plan    Acute on chronic combined systolic and diastolic HF:    Difficult time with diuresis secondary to CKD, status post thoracentesis last week removing 1.2 liters.  Unable to titrate meds with low BP.  Malnutrition complicates the issue.  Continuing IV diuresis as tolerated. Will repeat an XRay today to consider thoracentesis on the left.    Coronary calcium:  No plan for  invasive evaluation at this time.   Continue risk reduction.   Anemia:  Hgb 7.7 yesterday.   Follow.  Anemia of chronic disease.  No acute blood loss suspected.     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   Palliative care consult:  Patient full code.  Outpatient palliative care recommended.  Eventual disposition will be home.   For questions or updates, please contact [facility] Please consult [email or web address] for contact info under Cardiology/STEMI.   Signed, Rollene Rotunda, MD  11/03/2022, 7:56 AM

## 2022-11-04 ENCOUNTER — Inpatient Hospital Stay (HOSPITAL_COMMUNITY): Payer: Medicare HMO

## 2022-11-04 DIAGNOSIS — I5043 Acute on chronic combined systolic (congestive) and diastolic (congestive) heart failure: Secondary | ICD-10-CM | POA: Diagnosis not present

## 2022-11-04 DIAGNOSIS — K31819 Angiodysplasia of stomach and duodenum without bleeding: Secondary | ICD-10-CM | POA: Diagnosis not present

## 2022-11-04 DIAGNOSIS — N179 Acute kidney failure, unspecified: Secondary | ICD-10-CM | POA: Diagnosis not present

## 2022-11-04 DIAGNOSIS — Z8673 Personal history of transient ischemic attack (TIA), and cerebral infarction without residual deficits: Secondary | ICD-10-CM | POA: Diagnosis not present

## 2022-11-04 LAB — BASIC METABOLIC PANEL
Anion gap: 9 (ref 5–15)
BUN: 29 mg/dL — ABNORMAL HIGH (ref 8–23)
CO2: 27 mmol/L (ref 22–32)
Calcium: 9.2 mg/dL (ref 8.9–10.3)
Chloride: 97 mmol/L — ABNORMAL LOW (ref 98–111)
Creatinine, Ser: 1.84 mg/dL — ABNORMAL HIGH (ref 0.44–1.00)
GFR, Estimated: 28 mL/min — ABNORMAL LOW (ref >60)
Glucose, Bld: 163 mg/dL — ABNORMAL HIGH (ref 70–99)
Potassium: 4.2 mmol/L (ref 3.5–5.1)
Sodium: 133 mmol/L — ABNORMAL LOW (ref 135–145)

## 2022-11-04 LAB — GLUCOSE, CAPILLARY
Glucose-Capillary: 158 mg/dL — ABNORMAL HIGH (ref 70–99)
Glucose-Capillary: 122 mg/dL — ABNORMAL HIGH (ref 70–99)
Glucose-Capillary: 249 mg/dL — ABNORMAL HIGH (ref 70–99)
Glucose-Capillary: 180 mg/dL — ABNORMAL HIGH (ref 70–99)

## 2022-11-04 MED ORDER — FAMOTIDINE 20 MG PO TABS
10.0000 mg | ORAL_TABLET | Freq: Once | ORAL | Status: AC | PRN
  Administered 2022-11-04: 10 mg via ORAL
  Filled 2022-11-04: qty 1

## 2022-11-04 MED ORDER — CALCIUM CARBONATE ANTACID 500 MG PO CHEW
1.0000 | CHEWABLE_TABLET | Freq: Once | ORAL | Status: AC
  Administered 2022-11-04: 200 mg via ORAL
  Filled 2022-11-04: qty 1

## 2022-11-04 MED ORDER — SODIUM CHLORIDE 0.9 % IV SOLN
12.5000 mg | Freq: Once | INTRAVENOUS | Status: AC | PRN
  Administered 2022-11-04: 12.5 mg via INTRAVENOUS
  Filled 2022-11-04: qty 0.5

## 2022-11-04 NOTE — Progress Notes (Signed)
Progress Note  Patient Name: Jessica Torres Date of Encounter: 11/04/2022  Primary Cardiologist:   Dietrich Pates, MD   Subjective   She is somnolent but denies chest pain or SOB. Positive cough with deep inspiration.   Inpatient Medications    Scheduled Meds:  aspirin  81 mg Oral Daily   docusate sodium  100 mg Oral Daily   enoxaparin (LOVENOX) injection  30 mg Subcutaneous Q24H   ezetimibe  10 mg Oral Daily   feeding supplement  237 mL Oral BID BM   fenofibrate  160 mg Oral Daily   furosemide  80 mg Intravenous BID   insulin aspart  0-9 Units Subcutaneous TID WC   levothyroxine  88 mcg Oral QAC breakfast   pantoprazole  40 mg Oral BID   polyethylene glycol  17 g Oral Daily   Continuous Infusions:  PRN Meds: guaiFENesin-dextromethorphan, ondansetron (ZOFRAN) IV, mouth rinse   Vital Signs    Vitals:   11/04/22 0252 11/04/22 0434 11/04/22 0448 11/04/22 0614  BP: (!) 105/45 (!) 117/42  (!) 104/46  Pulse:  (!) 107  96  Resp: 20 (!) 21  18  Temp: 98.5 F (36.9 C) 98.9 F (37.2 C)  98.3 F (36.8 C)  TempSrc:      SpO2: 94% 93%  97%  Weight:   55 kg   Height:        Intake/Output Summary (Last 24 hours) at 11/04/2022 0745 Last data filed at 11/04/2022 0606 Gross per 24 hour  Intake 650 ml  Output 1651 ml  Net -1001 ml   Filed Weights   11/01/22 0437 11/03/22 0401 11/04/22 0448  Weight: 60.6 kg 56.4 kg 55 kg    Telemetry    NSR - Personally Reviewed  ECG    NA - Personally Reviewed  Physical Exam   GEN: No acute distress.   Neck: No  JVD Cardiac: RRR, no murmurs, rubs, or gallops.  Respiratory:      Decreased breath sounds with coarse crackles on the right and dullness to percussion on the left.   GI: Soft, nontender, non-distended  MS: No edema; No deformity. Neuro:  Nonfocal  Psych: Normal affect   Labs    Chemistry Recent Labs  Lab 11/01/22 0340 11/02/22 0432 11/03/22 0423  NA 128* 131* 133*  K 4.1 3.5 3.3*  CL 96* 97* 96*  CO2 23  25 28   GLUCOSE 148* 116* 134*  BUN 32* 35* 34*  CREATININE 2.39* 2.13* 2.19*  CALCIUM 8.7* 8.5* 9.0  PROT 7.1  --   --   ALBUMIN 2.6*  --   --   AST 50*  --   --   ALT 19  --   --   ALKPHOS 39  --   --   BILITOT 4.9*  --   --   GFRNONAA 20* 23* 22*  ANIONGAP 9 9 9      Hematology Recent Labs  Lab 10/30/22 0149 10/31/22 0442 11/01/22 0340  WBC 6.7 6.2 7.1  RBC 2.61* 2.55* 2.58*  HGB 7.7* 7.5* 7.7*  HCT 23.4* 23.4* 23.8*  MCV 89.7 91.8 92.2  MCH 29.5 29.4 29.8  MCHC 32.9 32.1 32.4  RDW 20.8* 20.4* 19.9*  PLT 209 202 211    Cardiac EnzymesNo results for input(s): "TROPONINI" in the last 168 hours. No results for input(s): "TROPIPOC" in the last 168 hours.   BNP Recent Labs  Lab 10/29/22 1222  BNP 3,983.8*     DDimer  No results for input(s): "DDIMER" in the last 168 hours.   Radiology    DG Chest Left Decubitus  Result Date: 11/03/2022 CLINICAL DATA:  Left pleural effusion. EXAM: CHEST - LEFT DECUBITUS COMPARISON:  November 01, 2022. FINDINGS: Small left pleural effusion noted on prior radiograph is not significantly changed suggesting possible loculation. Left basilar opacity is noted concerning for atelectasis or infiltrate. Probable small loculated right pleural effusion is noted with associated opacity. IMPRESSION: See above. Electronically Signed   By: Lupita Raider M.D.   On: 11/03/2022 17:46    Cardiac Studies    ECHO 11/01/22   1. Limited study   2. Left ventricular ejection fraction, by estimation, is 30 to 35%. Left  ventricular ejection fraction by 2D MOD biplane is 32.2 %. The left  ventricle has moderately decreased function. The left ventricle  demonstrates regional wall motion abnormalities  (see scoring diagram/findings for description). diastolic function is  indeterminate due to lack of tissue doppler. There is severe hypokinesis  of the left ventricular, mid-apical inferoseptal wall, septal wall,  lateral wall and apical segment.   3. A  small pericardial effusion is present.   4. Moderate to severe mitral valve regurgitation.   5. The aortic root is heaily calcified, but there does not appears to be  significant aortic stenosis. Doppler velocities were not obtained.   6. There is mildly elevated pulmonary artery systolic pressure. The  estimated right ventricular systolic pressure is 39.4 mmHg.   7. The inferior vena cava is dilated in size with <50% respiratory  variability, suggesting right atrial pressure of 15 mmHg.   Patient Profile     82 y.o. female with a hx of PAD, right carotid artery disease,  hypertension, hyperlipidemia, GIB with GAVE/gastritis, CKD IIIb, arthritis, GERD, hypothyroidism, CVA, OSA, DM, cirrhosis who is being seen for the evaluation of acute congestive heart failure     Assessment & Plan    Acute on chronic combined systolic and diastolic HF:  Status post right thoracentesis.   Intake and out put 2.9 liters this admission.  (Likely not entirely accurate.)     Decub films with smaller left effusion.  Dr. Ella Jubilee will review with radiology to consider thoracentesis on this side as well.  Otherwise we are likely at the end of our acute therapies that will offer any significant symptomatic relief.    I would suggest Lasix 40 mg BID at discharge and decrease to once daily if she starts to have decreased PO intake at home.  BP will not allow med titration and also limited by CKD.     Elevated coronary calcium:  Risk reduction.  No plan for invasive or non invasive study.   Anemia:  No active bleeding.  Conservative therapy.    CKD IV:  Cerat was up  stable yesterday and better today.      PVD:  Medical management.    Palliative consult:  I spoke with her sister yesterday.  They are no ready for hospice care.     For questions or updates, please contact [facility] Please consult [email or web address] for contact info under Cardiology/STEMI.   Signed, Rollene Rotunda, MD  11/04/2022, 7:45 AM

## 2022-11-04 NOTE — Progress Notes (Signed)
PT Cancellation Note  Patient Details Name: Jessica Torres MRN: [id number] DOB: 1940/06/25   Cancelled Treatment:    Reason Eval/Treat Not Completed: Fatigue/lethargy limiting ability to participate - pt sleeping upon PT arrival to room, wakes and declines OOB given fatigue. PT to check back at a later date.   Marye Round, PT DPT Acute Rehabilitation Services Secure Chat Preferred  Office [phone]    Truddie Coco 11/04/2022, 3:58 PM

## 2022-11-04 NOTE — Progress Notes (Signed)
   11/04/22 0124  Assess: MEWS Score  Temp 99.2 F (37.3 C)  BP (!) 100/51  MAP (mmHg) 67  Pulse Rate (!) 106  ECG Heart Rate (!) 106  Resp 17  SpO2 95 %  O2 Device Nasal Cannula  Assess: MEWS Score  MEWS Temp 0  MEWS Systolic 1  MEWS Pulse 1  MEWS RR 0  MEWS LOC 0  MEWS Score 2  MEWS Score Color Yellow  Assess: if the MEWS score is Yellow or Red  Were vital signs accurate and taken at a resting state? No, vital signs rechecked  Does the patient meet 2 or more of the SIRS criteria? No  MEWS guidelines implemented  Yes, yellow  Treat  MEWS Interventions Considered administering scheduled or prn medications/treatments as ordered  Take Vital Signs  Increase Vital Sign Frequency  Yellow: Q2hr x1, continue Q4hrs until patient remains green for 12hrs  Escalate  MEWS: Escalate Yellow: Discuss with charge nurse and consider notifying provider and/or RRT  Notify: Charge Nurse/RN  Name of Charge Nurse/RN Notified Domingo Dimes  Assess: SIRS CRITERIA  SIRS Temperature  0  SIRS Pulse 1  SIRS Respirations  0  SIRS WBC 0  SIRS Score Sum  1

## 2022-11-04 NOTE — Plan of Care (Signed)
  Problem: Education: Goal: Knowledge of General Education information will improve Description: Including pain rating scale, medication(s)/side effects and non-pharmacologic comfort measures Outcome: Progressing   Problem: Clinical Measurements: Goal: Ability to maintain clinical measurements within normal limits will improve Outcome: Progressing Goal: Will remain free from infection Outcome: Progressing Goal: Diagnostic test results will improve Outcome: Progressing Goal: Cardiovascular complication will be avoided Outcome: Progressing   Problem: Elimination: Goal: Will not experience complications related to bowel motility Outcome: Progressing Goal: Will not experience complications related to urinary retention Outcome: Progressing   Problem: Pain Managment: Goal: General experience of comfort will improve Outcome: Progressing

## 2022-11-04 NOTE — Progress Notes (Signed)
Heart Failure Navigator Progress Note  Assessed for Heart & Vascular TOC clinic readiness.  Patient does not meet criteria due to going home with Hospice per MD.   Navigator will sign off at this time.    Mehreen Azizi,RN, BSN,MSN Heart Failure Nurse Navigator. Contact by secure chat only.

## 2022-11-04 NOTE — Plan of Care (Signed)
  Problem: Pain Managment: Goal: General experience of comfort will improve Outcome: Progressing   Problem: Safety: Goal: Ability to remain free from injury will improve Outcome: Progressing   Problem: Skin Integrity: Goal: Risk for impaired skin integrity will decrease Outcome: Progressing   

## 2022-11-04 NOTE — Progress Notes (Signed)
Progress Note   [name] QMV:[id number] DOB: 19-Mar-1940 DOA: 10/29/2022     6 DOS: the patient was seen and examined on 11/04/2022   Brief hospital course: Mrs. Pierini was admitted to the hospital with the working diagnosis of heart failure exacerbation.    82 y.o. 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 Recent hospitalization in July 2024 for heart failure in [facility], medical therapy including diuresis was challenging due to hypotension. At home she developed progressive dyspnea and lower extremity edema. Her symptoms were refractive to doubling her furosemide dose. Because persistent symptoms she was brought to the hospital for further evaluation. On her initial physical examination her blood pressure was 117/57, HR 87, RR 23 and 02 saturation 90%, lungs with bilateral rales, heart with S2 and S2 present and rhythmic, abdomen with no distention and positive lower extremity edema +++.   Na 135, K 3,6 CL 101, bicarbonate 24, glucose 168, bun 34, cr 1,89  BNP 3,983 Wbc 6.8 hgb 8.1 plt 207  Sars covid 19 negative  Chest radiograph with cardiomegaly, bilateral hilar vascular congestion, right pleural effusion.   CT chest with large right pleural effusion and moderate left pleural effusion. Right and left lower lobes compression atelectasis, bilateral ground glass opacities.   EKG 86 bpm, normal axis, normal intervals, sinus rhythm with ST depression V5 and V6, with no significant T wave changes.   8/21: Admitted on IV Lasix 8/22: Improving and off O2, still symptomatic 8/23: Breathing worse, cough worse, now requiring 1-2L O2; Cardiology consulted; CT chest showed large effusion 08/24 IV furosemide with IV albumin.  08/25 improving volume status, echocardiogram with reduction in LV systolic function.  08/26 continue  with hypervolemia.  08/27 lateral decubitus film with small loculated left effusion, requested US imaging to confirm.   Assessment and Plan: * Acute on chronic combined systolic and diastolic CHF (congestive heart failure) (HCC) Echocardiogram with reduction of LV systolic function down to 30 to 35% (worsening from 05/24), severe hypokinesis of the left ventricular mid apical inferoseptal wall, septal wall, lateral wall and apical segment. Small pericardial effusion, moderate to severe mitral regurgitation, RVSP 39.4   Urine output 1,650 ml,  Systolic blood pressure 104 to 112 mmHg.   Had IV albumin to improve bioavailability of furosemide.   Plan to continue with IV furosemide 80 mg bid and transition to 40 mg po bid at discharge.  Continue metoprolol.  Holding RAAS inhibition and SGLT 2 inh.   Limited pharmacologic options due to reduced GFR and risk of hypotension.  I spoke with her sister and she understands poor prognosis considering heart failure, renal failure and limited pharmacologic options.  She has requested to have home hospice services at the time of her discharge.   Acute hypoxemic respiratory failure, due to acute cardiogenic pulmonary edema. Right pleural effusion sp thoracentesis, 1.2  L removed. Transudate.   Left lateral decubitus film with no layering of left effusion, looks small and loculated. CT chest tends to overestimate effusion size. Will request pleural [place] to assess effusion. I suspect to small to attempt thoracentesis.   02 saturation is 94 to 97% on 2 L/min per [place].   Acute kidney injury on chronic kidney disease stage IV (HCC) Cardiorenal syndrome, hyponatremia, hypokalemia.   Today renal function with serum cr at 1,84 with K at 4,2  and serum bicarbonate at 27. Na 133.   Plan to continue diuresis with furosemide IV 80 mg q12 hrs.    Follow up renal function in am.   Type 2 diabetes mellitus with hyperlipidemia (HCC) Continue insulin sliding scale  for glucose cover and monitoring.  Fasting glucose this am 163 mg/dl.   Continue fenofibrate and ezetimibe.   GAVE (gastric antral vascular ectasia) Continue with pantoprazole.   History of CVA (cerebrovascular accident) - Continue aspirin, ezetimibe and fibrate.   Hypothyroidism Continue levothyroxine  Anemia Iron 39, tibc 405, transferrin saturation 10. Consistent with anemia of iron deficiency combined with anemia of chronic disease. SP IV iron infusion.           Subjective: Patient with was not able to sleep last night, this am is somnolent, her sister is at the bedside   Physical Exam: Vitals:   11/04/22 0448 11/04/22 0614 11/04/22 1050 11/04/22 1200  BP:  (!) 104/46 (!) 111/52 (!) 112/52  Pulse:  96 96 92  Resp:  18 20 16   Temp:  98.3 F (36.8 C) 98.7 F (37.1 C)   TempSrc:   Axillary   SpO2:  97% 94% 97%  Weight: 55 kg     Height:       Neurology somnolent, responds to voice and touch ENT with mild pallor Cardiovascular with S1 and S2 present and regular with no gallops, positive systolic murmur at the right lower sternal border Positive JVD No lower extremity edema  Respiratory with rales bilaterally and intermittent rhonchi. Abdomen with no distention  Data Reviewed:    Family Communication: I spoke with patient's sister at the bedside, we talked in detail about patient's condition, plan of care and prognosis and all questions were addressed.   Disposition: Status is: Inpatient Remains inpatient appropriate because: diuresis IV   Planned Discharge Destination: Home with home hospice      Author: Coralie Keens, MD 11/04/2022 2:20 PM  For on call review [email or web address].

## 2022-11-04 NOTE — Progress Notes (Signed)
   Heart Failure Stewardship Pharmacist Progress Note   PCP: Rebekah Chesterfield, NP PCP-Cardiologist: Dietrich Pates, MD    HPI:  82 yo F with PMH of CHF, CAD, PAD, GAVE, T2DM, hypothyroidism, CKD IIIb, and anemia.   She was recently hospitalized at [facility] for acute on chronic systolic CHF. EF 40-45%. Difficulty with diuresis with hypotension. Her metoprolol was changed to carvedilol. However, at follow up with cardiology, carvedilol was switched back to metoprolol XL.   She presented to the ED on 8/21 with continued shortness of breath, LE edema, orthopnea, and cough. CXR with worsening bilateral pleural effusions with opacities. BNP 3983. ECHO 8/24 showed EF worsening to 30-35%, RWMA, moderate to severe MR. S/p successful R thoracentesis on 8/24, yielding 1.2L of fluid. Palliative consulted - patient and family wish to continue current scope of treatment with home health and PT. Repeated CXR for possible L thoracentesis.   Current HF Medications: Diuretic: furosemide 80 mg IV BID  Prior to admission HF Medications: Diuretic: furosemide 20 mg daily Beta blocker: carvedilol 3.125 mg BID AND metoprolol XL 25 mg daily on profile SGLT2i: Farxiga 10 mg daily AND Jardiance 10 mg daily on profile  Pertinent Lab Values: Serum creatinine 1.84, BUN 29, Potassium 4.2, Sodium 133, Magnesium 2.3, BNP 3983.8  Vital Signs: Weight: 121 lbs (admission weight: 125 lbs) Blood pressure: 90-110/40s  Heart rate: 90-110s  I/O: net -0.8L yesterday; net -2.9L since admission  Medication Assistance / Insurance Benefits Check: Does the patient have prescription insurance?  Yes Type of insurance plan: [employer] Medicare  Outpatient Pharmacy:  [facility] Is the patient willing to use [facility] pharmacy at discharge? Yes Is the patient willing to transition their outpatient pharmacy to utilize a [facility] outpatient pharmacy?   No - gets free mail delivery service with [facility]     Assessment: 1. Acute on chronic systolic CHF (LVEF 40-45%). NYHA class III symptoms. - Continue furosemide 80 mg IV BID. Possible L thoracentesis. Strict I/Os and daily weights. Keep K>4 and Mg>2.   - Stopped metoprolol on 8/25 with hypotension, hold on adding ARB or MRA - Caution SGLT2i with limited mobility - reports she was mostly bedbound at home  Plan: 1) Medication changes recommended at this time: - Continue IV lasix  2) Patient assistance: - None pending  3)  Education  - Patient has been educated on current HF medications and potential additions to HF medication regimen - Patient verbalizes understanding that over the next few months, these medication doses may change and more medications may be added to optimize HF regimen - Patient has been educated on basic disease state pathophysiology and goals of therapy   Sharen Hones, PharmD, BCPS Heart Failure Stewardship Pharmacist Phone [phone]

## 2022-11-05 DIAGNOSIS — I5043 Acute on chronic combined systolic (congestive) and diastolic (congestive) heart failure: Secondary | ICD-10-CM | POA: Diagnosis not present

## 2022-11-05 LAB — CBC
HCT: 24.4 % — ABNORMAL LOW (ref 36.0–46.0)
Hemoglobin: 7.9 g/dL — ABNORMAL LOW (ref 12.0–15.0)
MCH: 29.9 pg (ref 26.0–34.0)
MCHC: 32.4 g/dL (ref 30.0–36.0)
MCV: 92.4 fL (ref 80.0–100.0)
Platelets: 204 10*3/uL (ref 150–400)
RBC: 2.64 MIL/uL — ABNORMAL LOW (ref 3.87–5.11)
RDW: 19.9 % — ABNORMAL HIGH (ref 11.5–15.5)
WBC: 6.7 10*3/uL (ref 4.0–10.5)
nRBC: 0.3 % — ABNORMAL HIGH (ref 0.0–0.2)

## 2022-11-05 LAB — COMPREHENSIVE METABOLIC PANEL
ALT: 13 U/L (ref 0–44)
AST: 37 U/L (ref 15–41)
Albumin: 2.5 g/dL — ABNORMAL LOW (ref 3.5–5.0)
Alkaline Phosphatase: 35 U/L — ABNORMAL LOW (ref 38–126)
Anion gap: 11 (ref 5–15)
BUN: 25 mg/dL — ABNORMAL HIGH (ref 8–23)
CO2: 30 mmol/L (ref 22–32)
Calcium: 9.5 mg/dL (ref 8.9–10.3)
Chloride: 95 mmol/L — ABNORMAL LOW (ref 98–111)
Creatinine, Ser: 1.7 mg/dL — ABNORMAL HIGH (ref 0.44–1.00)
GFR, Estimated: 30 mL/min — ABNORMAL LOW (ref >60)
Glucose, Bld: 132 mg/dL — ABNORMAL HIGH (ref 70–99)
Potassium: 3.6 mmol/L (ref 3.5–5.1)
Sodium: 136 mmol/L (ref 135–145)
Total Bilirubin: 4.5 mg/dL — ABNORMAL HIGH (ref 0.3–1.2)
Total Protein: 6.6 g/dL (ref 6.5–8.1)

## 2022-11-05 LAB — GLUCOSE, CAPILLARY
Glucose-Capillary: 113 mg/dL — ABNORMAL HIGH (ref 70–99)
Glucose-Capillary: 254 mg/dL — ABNORMAL HIGH (ref 70–99)
Glucose-Capillary: 205 mg/dL — ABNORMAL HIGH (ref 70–99)
Glucose-Capillary: 255 mg/dL — ABNORMAL HIGH (ref 70–99)

## 2022-11-05 MED ORDER — FUROSEMIDE 40 MG PO TABS
40.0000 mg | ORAL_TABLET | Freq: Two times a day (BID) | ORAL | Status: DC
  Administered 2022-11-06 – 2022-11-07 (×3): 40 mg via ORAL
  Filled 2022-11-05 (×3): qty 1

## 2022-11-05 NOTE — Progress Notes (Signed)
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 -Recently hospitalized 7/24 for CHF in [facility] complicated by hypotension. -In the ED noted to be volume overloaded with 3+ edema, creatinine 1.89, BNP 3983, hemoglobin 8.1, chest x-ray noted pulmonary vascular congestion and right pleural effusion, CT chest noted large right pleural effusion and moderate left pleural effusion  -Admitted, started on diuretics, followed by cardiology -8/24 right thoracentesis 1.2 L drained  -Prognosis felt to be poor, palliative care was consulted  Subjective: -Feels okay, breathing better overall  Assessment and Plan:  Acute on chronic combined systolic and diastolic CHF Moderate to severe mitral regurgitation Presumed CAD  -Echo 30 to 35% (worsening from 05/24), severe hypokinesis of the left ventricular mid apical inferoseptal wall, septal wall, lateral wall and apical segment. Small pericardial effusion, moderate to severe mitral regurgitation, RVSP 39.4  -CT chest shows significant coronary artery calcifications, -Followed by cardiology, on account of overall frailty has been primarily bedbound,, AKI/CKD cardiac catheterization was not pursued -Diuresed with IV Lasix, albumin used as well -Volume status has improved, GDMT limited by hypotension through this admission -Switch to Lasix 40 Mg twice daily, continue metoprolol -Cardiology recommended hospice to family yesterday, - I  discussed CODE STATUS with the patient again this morning, she is agreeable to DNR  Acute hypoxemic respiratory failure, due to acute cardiogenic pulmonary edema. Right pleural effusion sp thoracentesis, 1.2  L removed.   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  -Hemoglobin stable in the 7-8 range -Anemia panel 8/24 with iron deficiency, will give IV iron  History of CVA (cerebrovascular accident) - Continue aspirin, ezetimibe and fibrate.   Hypothyroidism Continue levothyroxine   DVT prophylaxis: Lovenox Code Status: DNR Family Communication: none present Disposition Plan: TBD  Consultants:    Procedures:   Antimicrobials:    Objective: Vitals:   11/05/22 0010 11/05/22 0400 11/05/22 0440 11/05/22 0719  BP: (!) 111/48 (!) 100/39  121/60  Pulse: 97 90    Resp: 17 16  20   Temp: 99.1 F (37.3 C) 98.9 F (37.2 C)  98.8 F (37.1 C)  TempSrc: Oral   Oral  SpO2: 95% 96%    Weight:   55.3 kg   Height:        Intake/Output Summary (Last 24 hours) at 11/05/2022 1003 Last data filed at 11/05/2022 0012 Gross per 24 hour  Intake --  Output 1150 ml  Net -1150 ml   Filed Weights   11/03/22 0401 11/04/22 0448 11/05/22 0440  Weight: 56.4 kg 55 kg 55.3 kg    Examination:  Gen: Awake, Alert, Oriented X 3, chronically ill-appearing HEENT: no JVD Lungs: Good air movement bilaterally, CTAB CVS: S1S2/RRR systolic murmur Abd: soft, Non tender, non distended, BS present Extremities: No edema Skin: no new rashes on exposed skin     Data Reviewed:   CBC: Recent Labs  Lab 10/29/22 1222 10/30/22 0149 10/31/22 0442 11/01/22 0340 11/05/22 0347  WBC 6.8 6.7 6.2 7.1 6.7  HGB 8.1*  7.7* 7.5* 7.7* 7.9*  HCT 24.7* 23.4* 23.4* 23.8* 24.4*  MCV 91.1 89.7 91.8 92.2 92.4  PLT 207 209 202 211 204   Basic Metabolic Panel: Recent Labs  Lab 11/01/22 0340 11/02/22 0432 11/03/22 0423 11/04/22 0706  11/05/22 0347  NA 128* 131* 133* 133* 136  K 4.1 3.5 3.3* 4.2 3.6  CL 96* 97* 96* 97* 95*  CO2 23 25 28 27 30   GLUCOSE 148* 116* 134* 163* 132*  BUN 32* 35* 34* 29* 25*  CREATININE 2.39* 2.13* 2.19* 1.84* 1.70*  CALCIUM 8.7* 8.5* 9.0 9.2 9.5  MG  --  2.2 2.3  --   --    GFR: Estimated Creatinine Clearance: 23.4 mL/min (A) (by C-G formula based on SCr of 1.7 mg/dL (H)). Liver Function Tests: Recent Labs  Lab 11/01/22 0340 11/05/22 0347  AST 50* 37  ALT 19 13  ALKPHOS 39 35*  BILITOT 4.9* 4.5*  PROT 7.1 6.6  ALBUMIN 2.6* 2.5*   No results for input(s): "LIPASE", "AMYLASE" in the last 168 hours. Recent Labs  Lab 10/29/22 1225  AMMONIA 21   Coagulation Profile: No results for input(s): "INR", "PROTIME" in the last 168 hours. Cardiac Enzymes: No results for input(s): "CKTOTAL", "CKMB", "CKMBINDEX", "TROPONINI" in the last 168 hours. BNP (last 3 results) No results for input(s): "PROBNP" in the last 8760 hours. HbA1C: No results for input(s): "HGBA1C" in the last 72 hours. CBG: Recent Labs  Lab 11/04/22 0519 11/04/22 1047 11/04/22 1545 11/04/22 2029 11/05/22 0602  GLUCAP 158* 122* 249* 180* 113*   Lipid Profile: No results for input(s): "CHOL", "HDL", "LDLCALC", "TRIG", "CHOLHDL", "LDLDIRECT" in the last 72 hours. Thyroid Function Tests: No results for input(s): "TSH", "T4TOTAL", "FREET4", "T3FREE", "THYROIDAB" in the last 72 hours. Anemia Panel: No results for input(s): "VITAMINB12", "FOLATE", "FERRITIN", "TIBC", "IRON", "RETICCTPCT" in the last 72 hours. Urine analysis:    Component Value Date/Time   APPEARANCEUR Clear 10/28/2019 0900   GLUCOSEU 3+ (A) 10/28/2019 0900   BILIRUBINUR Negative 10/28/2019 0900   PROTEINUR Negative 10/28/2019 0900   NITRITE Negative 10/28/2019 0900   LEUKOCYTESUR Negative 10/28/2019 0900   Sepsis Labs: @LABRCNTIP (procalcitonin:4,lacticidven:4)  ) Recent Results (from the past 240 hour(s))  Resp panel by RT-PCR (RSV, Flu  A&B, Covid) Anterior Nasal Swab     Status: None   Collection Time: 10/29/22  1:55 PM   Specimen: Anterior Nasal Swab  Result Value Ref Range Status   SARS Coronavirus 2 by RT PCR NEGATIVE NEGATIVE Final    Comment: (NOTE) SARS-CoV-2 target nucleic acids are NOT DETECTED.  The SARS-CoV-2 RNA is generally detectable in upper respiratory specimens during the acute phase of infection. The lowest concentration of SARS-CoV-2 viral copies this assay can detect is 138 copies/mL. A negative result does not preclude SARS-Cov-2 infection and should not be used as the sole basis for treatment or other patient management decisions. A negative result may occur with  improper specimen collection/handling, submission of specimen other than nasopharyngeal swab, presence of viral mutation(s) within the areas targeted by this assay, and inadequate number of viral copies(<138 copies/mL). A negative result must be combined with clinical observations, patient history, and epidemiological information. The expected result is Negative.  Fact Sheet for Patients:  BloggerCourse.com  Fact Sheet for Healthcare Providers:  SeriousBroker.it  This test is no t yet approved or cleared by the [place] FDA and  has been authorized for detection and/or diagnosis of SARS-CoV-2 by FDA under an Emergency Use Authorization (EUA). This  EUA will remain  in effect (meaning this test can be used) for the duration of the COVID-19 declaration under Section 564(b)(1) of the Act, 21 U.S.C.section 360bbb-3(b)(1), unless the authorization is terminated  or revoked sooner.       Influenza A by PCR NEGATIVE NEGATIVE Final   Influenza B by PCR NEGATIVE NEGATIVE Final    Comment: (NOTE) The Xpert Xpress SARS-CoV-2/FLU/RSV plus assay is intended as an aid in the diagnosis of influenza from Nasopharyngeal swab specimens and should not be used as a sole basis for treatment.  Nasal washings and aspirates are unacceptable for Xpert Xpress SARS-CoV-2/FLU/RSV testing.  Fact Sheet for Patients: BloggerCourse.com  Fact Sheet for Healthcare Providers: SeriousBroker.it  This test is not yet approved or cleared by the [place] FDA and has been authorized for detection and/or diagnosis of SARS-CoV-2 by FDA under an Emergency Use Authorization (EUA). This EUA will remain in effect (meaning this test can be used) for the duration of the COVID-19 declaration under Section 564(b)(1) of the Act, 21 U.S.C. section 360bbb-3(b)(1), unless the authorization is terminated or revoked.     Resp Syncytial Virus by PCR NEGATIVE NEGATIVE Final    Comment: (NOTE) Fact Sheet for Patients: BloggerCourse.com  Fact Sheet for Healthcare Providers: SeriousBroker.it  This test is not yet approved or cleared by the [place] FDA and has been authorized for detection and/or diagnosis of SARS-CoV-2 by FDA under an Emergency Use Authorization (EUA). This EUA will remain in effect (meaning this test can be used) for the duration of the COVID-19 declaration under Section 564(b)(1) of the Act, 21 U.S.C. section 360bbb-3(b)(1), unless the authorization is terminated or revoked.  Performed at [facility], [address] [place], [place] [address]      Radiology Studies: [place] CHEST (PLEURAL EFFUSION)  Result Date: 11/04/2022 CLINICAL DATA:  Pleural effusions EXAM: CHEST ULTRASOUND COMPARISON:  Chest x-ray 11/03/2022 FINDINGS: Bilateral pleural effusions are seen, moderate left and small right. IMPRESSION: Bilateral pleural effusions.  Moderate left and small right. Electronically Signed   By: Karen Kays M.D.   On: 11/04/2022 19:48   DG Chest Left Decubitus  Result Date: 11/03/2022 CLINICAL DATA:  Left pleural effusion. EXAM: CHEST - LEFT DECUBITUS  COMPARISON:  November 01, 2022. FINDINGS: Small left pleural effusion noted on prior radiograph is not significantly changed suggesting possible loculation. Left basilar opacity is noted concerning for atelectasis or infiltrate. Probable small loculated right pleural effusion is noted with associated opacity. IMPRESSION: See above. Electronically Signed   By: Lupita Raider M.D.   On: 11/03/2022 17:46     Scheduled Meds:  aspirin  81 mg Oral Daily   docusate sodium  100 mg Oral Daily   enoxaparin (LOVENOX) injection  30 mg Subcutaneous Q24H   ezetimibe  10 mg Oral Daily   feeding supplement  237 mL Oral BID BM   fenofibrate  160 mg Oral Daily   [START ON 11/06/2022] furosemide  40 mg Oral BID   insulin aspart  0-9 Units Subcutaneous TID WC   levothyroxine  88 mcg Oral QAC breakfast   pantoprazole  40 mg Oral BID   polyethylene glycol  17 g Oral Daily   Continuous Infusions:   LOS: 7 days    Time spent:    Zannie Cove, MD [facility]   11/05/2022, 10:03 AM

## 2022-11-05 NOTE — Progress Notes (Signed)
Physical Therapy Treatment Patient Details Name: Jessica Torres MRN: [id number] DOB: 28-Mar-1940 Today's Date: 11/05/2022   History of Present Illness Pt is a 82 y.o. female admitted 8/21 with CHF. She underwent R thoracentesis 8/24. 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  Reports no pain or SOB, though a little fatigue.  Emphasis on exercise and progression of gait with the RW.    If plan is discharge home, recommend the following: A little help with walking and/or transfers;A little help with bathing/dressing/bathroom;Assistance with cooking/housework;Assist for transportation;Help with stairs or ramp for entrance   Can travel by private vehicle        Equipment Recommendations  Other (comment) (TBD)    Recommendations for Other Services       Precautions / Restrictions Precautions Precautions: Fall Precaution Comments: watch sats     Mobility  Bed Mobility Overal bed mobility: Needs Assistance Bed Mobility: Supine to Sit, Sit to Supine     Supine to sit: Contact guard Sit to supine: Contact guard assist        Transfers Overall transfer level: Needs assistance   Transfers: Sit to/from Stand Sit to Stand: Contact guard assist                Ambulation/Gait Ambulation/Gait assistance: Contact guard assist, Min assist Gait Distance (Feet): 200 Feet Assistive device: Rolling walker (2 wheels) Gait Pattern/deviations: Step-through pattern, Decreased stride length   Gait velocity interpretation: <1.31 ft/sec, indicative of household ambulator   General Gait Details: generally steady with periods of mild unsteadiness.  Slower step through pattern  Sats maintained at 88-91% on RA with activity and 94% at rest on RA.  Reapplied O2 before leaving.   Stairs             Wheelchair Mobility     Tilt Bed    Modified Rankin (Stroke  Patients Only)       Balance     Sitting balance-Leahy Scale: Good       Standing balance-Leahy Scale: Poor                              Cognition Arousal: Alert Behavior During Therapy: WFL for tasks assessed/performed Overall Cognitive Status: Within Functional Limits for tasks assessed                                          Exercises Other Exercises Other Exercises: hip knee flex/ext ROM with graded resistance x10 bil Other Exercises: bicep/tricep presses x10 reps bil with graded resistance.    General Comments        Pertinent Vitals/Pain Pain Assessment Pain Assessment: Faces Faces Pain Scale: No hurt Pain Intervention(s): Monitored during session    Home Living                          Prior Function            PT Goals (current goals can now be found in the care plan section) Acute Rehab PT Goals PT Goal Formulation: With patient Time For Goal Achievement: 11/16/22 Potential to Achieve Goals: Fair Progress towards PT goals: Progressing toward goals    Frequency  Min 1X/week      PT Plan      Co-evaluation              AM-PAC PT "6 Clicks" Mobility   Outcome Measure  Help needed turning from your back to your side while in a flat bed without using bedrails?: A Little Help needed moving from lying on your back to sitting on the side of a flat bed without using bedrails?: A Little Help needed moving to and from a bed to a chair (including a wheelchair)?: A Little Help needed standing up from a chair using your arms (e.g., wheelchair or bedside chair)?: A Little Help needed to walk in hospital room?: A Little Help needed climbing 3-5 steps with a railing? : A Little 6 Click Score: 18    End of Session   Activity Tolerance: Patient tolerated treatment well Patient left: in bed;with call bell/phone within reach;with bed alarm set Nurse Communication: Mobility status PT Visit Diagnosis:  Difficulty in walking, not elsewhere classified (R26.2)     Time: 1610-9604 PT Time Calculation (min) (ACUTE ONLY): 17 min  Charges:    $Gait Training: 8-22 mins PT General Charges $$ ACUTE PT VISIT: 1 Visit                     11/05/2022  Jacinto Halim., PT Acute Rehabilitation Services [phone]  (office)   Eliseo Gum Crystelle Ferrufino 11/05/2022, 7:42 PM

## 2022-11-05 NOTE — Plan of Care (Signed)
  Problem: Education: Goal: Knowledge of General Education information will improve Description: Including pain rating scale, medication(s)/side effects and non-pharmacologic comfort measures Outcome: Progressing   Problem: Nutrition: Goal: Adequate nutrition will be maintained Outcome: Progressing   Problem: Coping: Goal: Level of anxiety will decrease Outcome: Progressing   Problem: Pain Managment: Goal: General experience of comfort will improve Outcome: Progressing   Problem: Safety: Goal: Ability to remain free from injury will improve Outcome: Progressing   Problem: Fluid Volume: Goal: Ability to maintain a balanced intake and output will improve Outcome: Progressing

## 2022-11-05 NOTE — TOC Progression Note (Signed)
Transition of Care [facility]) - Progression Note    Patient Details  Name: Jessica Torres MRN: [id number] Date of Birth: November 29, 1940  Transition of Care [facility]) CM/SW Contact  Leone Haven, RN Phone Number: 11/05/2022, 4:57 PM  Clinical Narrative:    NCM spoke with Thayer Ohm the son, he said they have a meeting at 1 pm tomorrow with Palliative .  NCM will check with son tomorrow after the Palliative meeting.   Expected Discharge Plan: Home w Home Health Services Barriers to Discharge: Continued Medical Work up  Expected Discharge Plan and Services In-house Referral: NA Discharge Planning Services: CM Consult Post Acute Care Choice: Home Health, Resumption of Svcs/PTA Provider Living arrangements for the past 2 months: Single Family Home                 DME Arranged: N/A         HH Arranged: RN, PT, OT HH Agency: [facility] Date HH Agency Contacted: 10/31/22 Time HH Agency Contacted: 1707 Representative spoke with at [facility] Agency: Brandi   Social Determinants of Health (SDOH) Interventions SDOH Screenings   Food Insecurity: No Food Insecurity (10/29/2022)  Housing: Low Risk  (10/30/2022)  Transportation Needs: No Transportation Needs (10/30/2022)  Utilities: Not At Risk (10/29/2022)  Alcohol Screen: Low Risk  (10/30/2022)  Depression (PHQ2-9): Medium Risk (02/21/2022)  Financial Resource Strain: Low Risk  (10/30/2022)  Physical Activity: Inactive (09/25/2020)  Social Connections: Unknown (08/27/2022)   Received from [facility]  Stress: No Stress Concern Present (10/12/2022)   Received from [facility]  Tobacco Use: Low Risk  (10/29/2022)    Readmission Risk Interventions    10/31/2022    5:12 PM  Readmission Risk Prevention Plan  Transportation Screening Complete  HRI or Home Care Consult Complete  Palliative Care Screening Not Applicable  Medication Review (RN Care Manager) Complete

## 2022-11-05 NOTE — Progress Notes (Signed)
Progress Note  Patient Name: Jessica Torres Date of Encounter: 11/05/2022  Primary Cardiologist:   Dietrich Pates, MD   Subjective   Much more awake today.  She says that she slept well and ate well this morning.   Inpatient Medications    Scheduled Meds:  aspirin  81 mg Oral Daily   docusate sodium  100 mg Oral Daily   enoxaparin (LOVENOX) injection  30 mg Subcutaneous Q24H   ezetimibe  10 mg Oral Daily   feeding supplement  237 mL Oral BID BM   fenofibrate  160 mg Oral Daily   furosemide  80 mg Intravenous BID   insulin aspart  0-9 Units Subcutaneous TID WC   levothyroxine  88 mcg Oral QAC breakfast   pantoprazole  40 mg Oral BID   polyethylene glycol  17 g Oral Daily   Continuous Infusions:  PRN Meds: guaiFENesin-dextromethorphan, ondansetron (ZOFRAN) IV, mouth rinse   Vital Signs    Vitals:   11/05/22 0010 11/05/22 0400 11/05/22 0440 11/05/22 0719  BP: (!) 111/48 (!) 100/39  121/60  Pulse: 97 90    Resp: 17 16  20   Temp: 99.1 F (37.3 C) 98.9 F (37.2 C)  98.8 F (37.1 C)  TempSrc: Oral   Oral  SpO2: 95% 96%    Weight:   55.3 kg   Height:        Intake/Output Summary (Last 24 hours) at 11/05/2022 0756 Last data filed at 11/05/2022 0012 Gross per 24 hour  Intake --  Output 1150 ml  Net -1150 ml   Filed Weights   11/03/22 0401 11/04/22 0448 11/05/22 0440  Weight: 56.4 kg 55 kg 55.3 kg    Telemetry    NSR - Personally Reviewed  ECG    NA - Personally Reviewed  Physical Exam   GEN: No  acute distress.    Frail.   Neck: No  JVD Cardiac: RRR, no murmurs, rubs, or gallops.  Respiratory:    Decreased breath sounds at the left base and coarse crackles at the right GI: Soft, nontender, non-distended, normal bowel sounds  MS:  No edema; No deformity. Neuro:   Nonfocal  Psych: Oriented and appropriate    Labs    Chemistry Recent Labs  Lab 11/01/22 0340 11/02/22 0432 11/03/22 0423 11/04/22 0706 11/05/22 0347  NA 128*   < > 133* 133* 136   K 4.1   < > 3.3* 4.2 3.6  CL 96*   < > 96* 97* 95*  CO2 23   < > 28 27 30   GLUCOSE 148*   < > 134* 163* 132*  BUN 32*   < > 34* 29* 25*  CREATININE 2.39*   < > 2.19* 1.84* 1.70*  CALCIUM 8.7*   < > 9.0 9.2 9.5  PROT 7.1  --   --   --  6.6  ALBUMIN 2.6*  --   --   --  2.5*  AST 50*  --   --   --  37  ALT 19  --   --   --  13  ALKPHOS 39  --   --   --  35*  BILITOT 4.9*  --   --   --  4.5*  GFRNONAA 20*   < > 22* 28* 30*  ANIONGAP 9   < > 9 9 11    < > = values in this interval not displayed.     Hematology Recent Labs  Lab  10/31/22 0442 11/01/22 0340 11/05/22 0347  WBC 6.2 7.1 6.7  RBC 2.55* 2.58* 2.64*  HGB 7.5* 7.7* 7.9*  HCT 23.4* 23.8* 24.4*  MCV 91.8 92.2 92.4  MCH 29.4 29.8 29.9  MCHC 32.1 32.4 32.4  RDW 20.4* 19.9* 19.9*  PLT 202 211 204    Cardiac EnzymesNo results for input(s): "TROPONINI" in the last 168 hours. No results for input(s): "TROPIPOC" in the last 168 hours.   BNP Recent Labs  Lab 10/29/22 1222  BNP 3,983.8*     DDimer No results for input(s): "DDIMER" in the last 168 hours.   Radiology    [place] CHEST (PLEURAL EFFUSION)  Result Date: 11/04/2022 CLINICAL DATA:  Pleural effusions EXAM: CHEST ULTRASOUND COMPARISON:  Chest x-ray 11/03/2022 FINDINGS: Bilateral pleural effusions are seen, moderate left and small right. IMPRESSION: Bilateral pleural effusions.  Moderate left and small right. Electronically Signed   By: Karen Kays M.D.   On: 11/04/2022 19:48   DG Chest Left Decubitus  Result Date: 11/03/2022 CLINICAL DATA:  Left pleural effusion. EXAM: CHEST - LEFT DECUBITUS COMPARISON:  November 01, 2022. FINDINGS: Small left pleural effusion noted on prior radiograph is not significantly changed suggesting possible loculation. Left basilar opacity is noted concerning for atelectasis or infiltrate. Probable small loculated right pleural effusion is noted with associated opacity. IMPRESSION: See above. Electronically Signed   By: Lupita Raider M.D.    On: 11/03/2022 17:46    Cardiac Studies    ECHO 11/01/22   1. Limited study   2. Left ventricular ejection fraction, by estimation, is 30 to 35%. Left  ventricular ejection fraction by 2D MOD biplane is 32.2 %. The left  ventricle has moderately decreased function. The left ventricle  demonstrates regional wall motion abnormalities  (see scoring diagram/findings for description). diastolic function is  indeterminate due to lack of tissue doppler. There is severe hypokinesis  of the left ventricular, mid-apical inferoseptal wall, septal wall,  lateral wall and apical segment.   3. A small pericardial effusion is present.   4. Moderate to severe mitral valve regurgitation.   5. The aortic root is heaily calcified, but there does not appears to be  significant aortic stenosis. Doppler velocities were not obtained.   6. There is mildly elevated pulmonary artery systolic pressure. The  estimated right ventricular systolic pressure is 39.4 mmHg.   7. The inferior vena cava is dilated in size with <50% respiratory  variability, suggesting right atrial pressure of 15 mmHg.   Patient Profile     82 y.o. female with a hx of PAD, right carotid artery disease,  hypertension, hyperlipidemia, GIB with GAVE/gastritis, CKD IIIb, arthritis, GERD, hypothyroidism, CVA, OSA, DM, cirrhosis who is being seen for the evaluation of acute congestive heart failure     Assessment & Plan    Acute on chronic combined systolic and diastolic HF:    Good UO yesterday.  Net negative 4.1 liters.  Small right effusion (post thoracentesis 8/24) and moderate left effusion.  Defer any further intervention to primary team.  Discussions noted about patient to have hospice care at home.  I would suggest transition to PO Lasix 40 mg bid.  Unable to titrate meds further.    Elevated coronary calcium:  Risk reduction.  No plan for invasive or non invasive study.   Anemia:  No active bleeding.   Hgb has been stable low.  No  indication for transfusion.    CKD IV:  Creat has stabilized.   [facility] will sign off.   Medication Recommendations:  See above Other recommendations (labs, testing, etc):  Home care with hospice Follow up as an outpatient:  I will cancel the 9/4 office appt.  We will be available to reschedule and can be reached by home nursing or Rebekah Chesterfield, NP at any time.    For questions or updates, please contact [facility] Please consult [email or web address] for contact info under Cardiology/STEMI.   Signed, Rollene Rotunda, MD  11/05/2022, 7:56 AM

## 2022-11-05 NOTE — Plan of Care (Signed)

## 2022-11-05 NOTE — Progress Notes (Signed)
Mobility Specialist Progress Note:    11/05/22 1048  Mobility  Activity Transferred from bed to chair  Level of Assistance Minimal assist, patient does 75% or more  Assistive Device Front wheel walker  Distance Ambulated (ft) 5 ft  Activity Response Tolerated well  Mobility Referral Yes  $Mobility charge 1 Mobility  Mobility Specialist Start Time (ACUTE ONLY) 1007  Mobility Specialist Stop Time (ACUTE ONLY) 1020  Mobility Specialist Time Calculation (min) (ACUTE ONLY) 13 min   Pt received in bed, declined ambulation but agreeable to transfer to chair. Pt needed MinA w/ bed mobility and STS. Was able to take a couple steps towards the chair w/o fault. Transferred on 2L/min, VSS. Situated in chair w/ call bell and personal belongings in reach. All needs met and chair alarm on.    Thompson Grayer Mobility Specialist  Please contact vis Secure Chat or  Rehab Office [phone]

## 2022-11-06 DIAGNOSIS — J9621 Acute and chronic respiratory failure with hypoxia: Secondary | ICD-10-CM

## 2022-11-06 DIAGNOSIS — R531 Weakness: Secondary | ICD-10-CM

## 2022-11-06 DIAGNOSIS — Z515 Encounter for palliative care: Secondary | ICD-10-CM | POA: Diagnosis not present

## 2022-11-06 DIAGNOSIS — I5043 Acute on chronic combined systolic (congestive) and diastolic (congestive) heart failure: Secondary | ICD-10-CM | POA: Diagnosis not present

## 2022-11-06 DIAGNOSIS — N179 Acute kidney failure, unspecified: Secondary | ICD-10-CM | POA: Diagnosis not present

## 2022-11-06 LAB — BASIC METABOLIC PANEL
Anion gap: 10 (ref 5–15)
BUN: 27 mg/dL — ABNORMAL HIGH (ref 8–23)
CO2: 30 mmol/L (ref 22–32)
Calcium: 9.3 mg/dL (ref 8.9–10.3)
Chloride: 92 mmol/L — ABNORMAL LOW (ref 98–111)
Creatinine, Ser: 1.7 mg/dL — ABNORMAL HIGH (ref 0.44–1.00)
GFR, Estimated: 30 mL/min — ABNORMAL LOW (ref >60)
Glucose, Bld: 192 mg/dL — ABNORMAL HIGH (ref 70–99)
Potassium: 3.5 mmol/L (ref 3.5–5.1)
Sodium: 132 mmol/L — ABNORMAL LOW (ref 135–145)

## 2022-11-06 LAB — GLUCOSE, CAPILLARY
Glucose-Capillary: 166 mg/dL — ABNORMAL HIGH (ref 70–99)
Glucose-Capillary: 237 mg/dL — ABNORMAL HIGH (ref 70–99)
Glucose-Capillary: 187 mg/dL — ABNORMAL HIGH (ref 70–99)
Glucose-Capillary: 263 mg/dL — ABNORMAL HIGH (ref 70–99)

## 2022-11-06 MED ORDER — SODIUM CHLORIDE 0.9 % IV SOLN
250.0000 mg | Freq: Every day | INTRAVENOUS | Status: AC
  Administered 2022-11-06 – 2022-11-07 (×2): 250 mg via INTRAVENOUS
  Filled 2022-11-06 (×2): qty 20

## 2022-11-06 NOTE — Progress Notes (Signed)
Mobility Specialist Progress Note:    11/06/22 1503  Mobility  Activity Ambulated with assistance in hallway  Level of Assistance Minimal assist, patient does 75% or more  Assistive Device Front wheel walker  Distance Ambulated (ft) 250 ft  Activity Response Tolerated well  Mobility Referral Yes  $Mobility charge 1 Mobility  Mobility Specialist Start Time (ACUTE ONLY) 1432  Mobility Specialist Stop Time (ACUTE ONLY) 1450  Mobility Specialist Time Calculation (min) (ACUTE ONLY) 18 min   Pt received in bed, agreeable to ambulate. Pt needed MinA w/ bed mobility and STS but just contact guard during ambulation. Upon standing pt noticed they were wet so RN and mobility specialist assisted in gown and bed change. No c/o throughout session. Returned to room w/o fault. When returning to room pt requested to sit up in chair. Call bell and personal belongings in reach. Family in room and all needs met.  Thompson Grayer Mobility Specialist  Please contact vis Secure Chat or  Rehab Office [phone]

## 2022-11-06 NOTE — Progress Notes (Signed)
Patient ID: LAIMA BRUNKHORST, female   DOB: Aug 04, 1940, 82 y.o.   MRN: [id number]    Progress Note from the Palliative Medicine Team at [facility]   Patient Name: Jessica Torres        Date: 11/06/2022 DOB: 1941-01-23  Age: 82 y.o. 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 -Recently hospitalized 7/24 for CHF in [facility] complicated by hypotension. -In the ED noted to be volume overloaded with 3+ edema, creatinine 1.89, BNP 3983, hemoglobin 8.1, chest x-ray noted pulmonary vascular congestion and right pleural effusion, CT chest noted large right pleural effusion and moderate left pleural effusion  -Admitted, started on diuretics, followed by cardiology -8/24 right thoracentesis 1.2 L drained   Patient and family face treatment option decisions, advanced directive decisions and anticipatory care needs.  Subjective  Extensive chart review has been completed prior to meeting with patient/family  including labs, vital signs, imaging, progress/consult notes, orders, medications and available advance directive documents.    This NP assessed patient at the bedside along with her son/Chris, his wife, and patient's sister/ Malachi Bonds.  Patient is alert and oriented but tired, at times she dozed off during conversation.  Education offered on the patient's serious medical condition secondary to her multiple co-morbidities, and the associated poor prognosis.  Occasion offered on the limitations of medical interventions to prolong quality of life when the body fails to  thrive.  Education offered on the difference between a full medical support path and a palliative comfort path allowing for natural death.  Education offered on hospice benefit; philosophy and eligibility.  End of care - DNR/DNI -Continue to treat the treatable-patient is hopeful for improvement -When medically stable, discharge home with home health services.  Patient is not interested in hospice services at this time.   (Education offered on patient's high risk for decompensation) -Medical management of chronic disease-hopes to avoid rehospitalization -Recommend outpatient palliative services.    MOST form completed, Hard Choices left for review  Education offered today regarding  the importance of continued conversation with family and their  medical providers regarding overall plan of care and treatment options,  ensuring decisions are within the context of the patients values and GOCs.  Questions and concerns addressed   Discussed with primary team and nursing staff   Time:  65 minutes  Discussed with Dr. Jomarie Longs and transition of care team   Detailed review of medical records ( labs, imaging, vital signs), medically appropriate exam ( MS, skin, cardia,  resp)   discussed with treatment team, counseling and education to patient, family, staff, documenting clinical information, medication management, coordination of care    Lorinda Creed NP  Palliative Medicine Team Team Phone # [phone] Pager [phone]

## 2022-11-06 NOTE — Plan of Care (Signed)
  Problem: Clinical Measurements: Goal: Respiratory complications will improve Outcome: Progressing   Problem: Activity: Goal: Risk for activity intolerance will decrease Outcome: Progressing   Problem: Nutrition: Goal: Adequate nutrition will be maintained Outcome: Progressing   Problem: Pain Managment: Goal: General experience of comfort will improve Outcome: Progressing   Problem: Safety: Goal: Ability to remain free from injury will improve Outcome: Progressing   

## 2022-11-06 NOTE — TOC Progression Note (Addendum)
Transition of Care [facility]) - Progression Note    Patient Details  Name: Jessica Torres MRN: [id number] Date of Birth: June 22, 1940  Transition of Care [facility]) CM/SW Contact  Leone Haven, RN Phone Number: 11/06/2022, 3:13 PM  Clinical Narrative:    Patient had palliative meeting today, NCM spoke with son Thayer Ohm, he said they decided to take patient home with [facility] services to her sister's home at [address] [place] [address] til the end of next week then she will go back to her regular address.  They would like to add HHAIDE and SW to the [facility] services.  NCM spoke with Tresa Endo with [facility] to add the additional services she states they will.  Per son Thayer Ohm she will go home by car.  They do not want Hospice.   Expected Discharge Plan: Home w Home Health Services Barriers to Discharge: Continued Medical Work up  Expected Discharge Plan and Services In-house Referral: NA Discharge Planning Services: CM Consult Post Acute Care Choice: Home Health, Resumption of Svcs/PTA Provider Living arrangements for the past 2 months: Single Family Home                 DME Arranged: N/A         HH Arranged: RN, PT, OT HH Agency: [facility] Date HH Agency Contacted: 10/31/22 Time HH Agency Contacted: 1707 Representative spoke with at [facility] Agency: Brandi   Social Determinants of Health (SDOH) Interventions SDOH Screenings   Food Insecurity: No Food Insecurity (10/29/2022)  Housing: Low Risk  (10/30/2022)  Transportation Needs: No Transportation Needs (10/30/2022)  Utilities: Not At Risk (10/29/2022)  Alcohol Screen: Low Risk  (10/30/2022)  Depression (PHQ2-9): Medium Risk (02/21/2022)  Financial Resource Strain: Low Risk  (10/30/2022)  Physical Activity: Inactive (09/25/2020)  Social Connections: Unknown (08/27/2022)   Received from [facility]  Stress: No Stress Concern Present (10/12/2022)   Received from [facility]  Tobacco Use: Low Risk  (10/29/2022)    Readmission Risk  Interventions    10/31/2022    5:12 PM  Readmission Risk Prevention Plan  Transportation Screening Complete  HRI or Home Care Consult Complete  Palliative Care Screening Not Applicable  Medication Review (RN Care Manager) Complete

## 2022-11-06 NOTE — Progress Notes (Signed)
Occupational Therapy Treatment Patient Details Name: Jessica Torres MRN: [id number] DOB: 09-10-1940 Today's Date: 11/06/2022   History of present illness Pt is a 82 y.o. female admitted 8/21 with CHF. She underwent R thoracentesis 8/24. 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 Family present during session. Pt able to complete transfers/standing ADLs with CGA, set up, good overall endurance. On supplemental O2 maintained 93%, off for 3 minutes during standing ADLs dropped to 90. Pt/family requested transport chair for out in community, and asked if she would need supplemental O2, O2 sat test to be performed. Pt improving well, will continue to follow acutely to maximize progress, DC plan to [facility] still appropriate.       If plan is discharge home, recommend the following:  Assist for transportation;Assistance with cooking/housework;A little help with bathing/dressing/bathroom;A little help with walking and/or transfers   Equipment Recommendations  Other (comment) (requested transport chair for out in community, may need supplemental O2)    Recommendations for Other Services      Precautions / Restrictions Precautions Precautions: Fall Precaution Comments: watch sats Restrictions Weight Bearing Restrictions: No       Mobility Bed Mobility Overal bed mobility: Needs Assistance             General bed mobility comments: in recliner    Transfers Overall transfer level: Needs assistance Equipment used: Rolling walker (2 wheels) Transfers: Sit to/from Stand, Bed to chair/wheelchair/BSC Sit to Stand: Contact guard assist           General transfer comment: CGA, good balance     Balance Overall balance assessment: Needs assistance Sitting-balance support: No upper extremity supported, Feet supported Sitting balance-Leahy Scale:  Good Sitting balance - Comments: recliner ADLs   Standing balance support: No upper extremity supported, During functional activity Standing balance-Leahy Scale: Fair Standing balance comment: able to stand at sink unsupported while maintaining balance performing BL ADLs.                           ADL either performed or assessed with clinical judgement   ADL Overall ADL's : Needs assistance/impaired                                       General ADL Comments: Pt demonstrates ability to complete ADLs with CGA/set up. Able to stand unsupported at sink performing ADLs as needed    Extremity/Trunk Assessment Upper Extremity Assessment Upper Extremity Assessment: Overall WFL for tasks assessed            Vision       Perception     Praxis      Cognition Arousal: Alert Behavior During Therapy: [employer] for tasks assessed/performed Overall Cognitive Status: Within Functional Limits for tasks assessed                                          Exercises      Shoulder Instructions       General Comments O2 maintained around 93 throughout session, dropped to 90 during face washing when O2 taken off for a few minutes.    Pertinent Vitals/  Pain       Pain Assessment Pain Assessment: No/denies pain  Home Living                                          Prior Functioning/Environment              Frequency  Min 1X/week        Progress Toward Goals  OT Goals(current goals can now be found in the care plan section)  Progress towards OT goals: Progressing toward goals  Acute Rehab OT Goals Patient Stated Goal: to return home OT Goal Formulation: With patient Time For Goal Achievement: 11/17/22 Potential to Achieve Goals: Good ADL Goals Pt Will Perform Grooming: with modified independence;standing Pt Will Perform Upper Body Dressing: with set-up;sitting Pt Will Perform Lower Body Dressing: with  set-up;sit to/from stand Pt Will Transfer to Toilet: with modified independence;regular height toilet;ambulating  Plan      Co-evaluation                 AM-PAC OT "6 Clicks" Daily Activity     Outcome Measure   Help from another person eating meals?: None Help from another person taking care of personal grooming?: None Help from another person toileting, which includes using toliet, bedpan, or urinal?: A Little Help from another person bathing (including washing, rinsing, drying)?: A Little Help from another person to put on and taking off regular upper body clothing?: None Help from another person to put on and taking off regular lower body clothing?: A Little 6 Click Score: 21    End of Session Equipment Utilized During Treatment: Gait belt;Rolling walker (2 wheels);Oxygen  OT Visit Diagnosis: Unsteadiness on feet (R26.81);Muscle weakness (generalized) (M62.81)   Activity Tolerance Patient tolerated treatment well   Patient Left in chair;with call bell/phone within reach;with family/visitor present;with chair alarm set   Nurse Communication Mobility status        Time: 2130-8657 OT Time Calculation (min): 33 min  Charges: OT General Charges $OT Visit: 1 Visit OT Treatments $Self Care/Home Management : 23-37 mins  [place], OTR/L   Alexis Goodell 11/06/2022, 3:40 PM

## 2022-11-06 NOTE — Progress Notes (Signed)
Progress Note  Patient Name: Jessica Torres Date of Encounter: 11/06/2022  Primary Cardiologist:   Dietrich Pates, MD   Subjective   No acute SOB or pain.    Inpatient Medications    Scheduled Meds:  aspirin  81 mg Oral Daily   docusate sodium  100 mg Oral Daily   enoxaparin (LOVENOX) injection  30 mg Subcutaneous Q24H   ezetimibe  10 mg Oral Daily   feeding supplement  237 mL Oral BID BM   fenofibrate  160 mg Oral Daily   furosemide  40 mg Oral BID   insulin aspart  0-9 Units Subcutaneous TID WC   levothyroxine  88 mcg Oral QAC breakfast   pantoprazole  40 mg Oral BID   polyethylene glycol  17 g Oral Daily   Continuous Infusions:  PRN Meds: guaiFENesin-dextromethorphan, ondansetron (ZOFRAN) IV, mouth rinse   Vital Signs    Vitals:   11/05/22 1927 11/05/22 2358 11/06/22 0440 11/06/22 0719  BP: (!) 116/47 (!) 104/45 (!) 113/51 (!) 111/47  Pulse: 88 (!) 101 95 93  Resp: 18 19 18 18   Temp: 98.5 F (36.9 C) 99 F (37.2 C) 98.8 F (37.1 C) 98.6 F (37 C)  TempSrc: Oral Oral Oral Oral  SpO2: 96% 92% 93% 94%  Weight:   56 kg   Height:        Intake/Output Summary (Last 24 hours) at 11/06/2022 0819 Last data filed at 11/06/2022 0616 Gross per 24 hour  Intake 660 ml  Output 650 ml  Net 10 ml   Filed Weights   11/04/22 0448 11/05/22 0440 11/06/22 0440  Weight: 55 kg 55.3 kg 56 kg    Telemetry    NA - Personally Reviewed  ECG    NA - Personally Reviewed  Physical Exam   GEN: No  acute distress.  Frail Neck: No  JVD Cardiac: RRR, no murmurs, rubs, or gallops.  Respiratory:     Decreased breath sounds bilateral bases GI: Soft, nontender, non-distended, normal bowel sounds  MS:  No edema; No deformity. Neuro:   Nonfocal  Psych: Oriented and appropriate    Labs    Chemistry Recent Labs  Lab 11/01/22 0340 11/02/22 0432 11/04/22 0706 11/05/22 0347 11/06/22 0327  NA 128*   < > 133* 136 132*  K 4.1   < > 4.2 3.6 3.5  CL 96*   < > 97* 95* 92*   CO2 23   < > 27 30 30   GLUCOSE 148*   < > 163* 132* 192*  BUN 32*   < > 29* 25* 27*  CREATININE 2.39*   < > 1.84* 1.70* 1.70*  CALCIUM 8.7*   < > 9.2 9.5 9.3  PROT 7.1  --   --  6.6  --   ALBUMIN 2.6*  --   --  2.5*  --   AST 50*  --   --  37  --   ALT 19  --   --  13  --   ALKPHOS 39  --   --  35*  --   BILITOT 4.9*  --   --  4.5*  --   GFRNONAA 20*   < > 28* 30* 30*  ANIONGAP 9   < > 9 11 10    < > = values in this interval not displayed.     Hematology Recent Labs  Lab 10/31/22 0442 11/01/22 0340 11/05/22 0347  WBC 6.2 7.1 6.7  RBC 2.55*  2.58* 2.64*  HGB 7.5* 7.7* 7.9*  HCT 23.4* 23.8* 24.4*  MCV 91.8 92.2 92.4  MCH 29.4 29.8 29.9  MCHC 32.1 32.4 32.4  RDW 20.4* 19.9* 19.9*  PLT 202 211 204    Cardiac EnzymesNo results for input(s): "TROPONINI" in the last 168 hours. No results for input(s): "TROPIPOC" in the last 168 hours.   BNP No results for input(s): "BNP", "PROBNP" in the last 168 hours.    DDimer No results for input(s): "DDIMER" in the last 168 hours.   Radiology    [place] CHEST (PLEURAL EFFUSION)  Result Date: 11/04/2022 CLINICAL DATA:  Pleural effusions EXAM: CHEST ULTRASOUND COMPARISON:  Chest x-ray 11/03/2022 FINDINGS: Bilateral pleural effusions are seen, moderate left and small right. IMPRESSION: Bilateral pleural effusions.  Moderate left and small right. Electronically Signed   By: Karen Kays M.D.   On: 11/04/2022 19:48    Cardiac Studies    ECHO 11/01/22   1. Limited study   2. Left ventricular ejection fraction, by estimation, is 30 to 35%. Left  ventricular ejection fraction by 2D MOD biplane is 32.2 %. The left  ventricle has moderately decreased function. The left ventricle  demonstrates regional wall motion abnormalities  (see scoring diagram/findings for description). diastolic function is  indeterminate due to lack of tissue doppler. There is severe hypokinesis  of the left ventricular, mid-apical inferoseptal wall, septal wall,   lateral wall and apical segment.   3. A small pericardial effusion is present.   4. Moderate to severe mitral valve regurgitation.   5. The aortic root is heaily calcified, but there does not appears to be  significant aortic stenosis. Doppler velocities were not obtained.   6. There is mildly elevated pulmonary artery systolic pressure. The  estimated right ventricular systolic pressure is 39.4 mmHg.   7. The inferior vena cava is dilated in size with <50% respiratory  variability, suggesting right atrial pressure of 15 mmHg.   Patient Profile     82 y.o. female with a hx of PAD, right carotid artery disease,  hypertension, hyperlipidemia, GIB with GAVE/gastritis, CKD IIIb, arthritis, GERD, hypothyroidism, CVA, OSA, DM, cirrhosis who is being seen for the evaluation of acute congestive heart failure     Assessment & Plan    Acute on chronic combined systolic and diastolic HF:     Intake and output is not recorded yesterday.    Continue oral Lasix as on MAR.    Elevated coronary calcium:  Risk reduction.  No further work up.   Anemia:  No active bleeding.   Hgb was stable yesterday.    CKD IV:  Creat unchanged today.  Na is drifting down and she needs free water restriction.    [facility] will sign off.   Medication Recommendations:  See above Other recommendations (labs, testing, etc):  Discussed with primary team.  Note from primary team indicating hospice care was not confirmed yesterday by the family.  Follow up as an outpatient:   We are rescheduling an office appt with his in [place].     For questions or updates, please contact [facility] Please consult [email or web address] for contact info under Cardiology/STEMI.   Signed, Rollene Rotunda, MD  11/06/2022, 8:19 AM

## 2022-11-06 NOTE — Inpatient Diabetes Management (Signed)
Inpatient Diabetes Program Recommendations  AACE/ADA: New Consensus Statement on Inpatient Glycemic Control (2015)  Target Ranges:  Prepandial:   less than 140 mg/dL      Peak postprandial:   less than 180 mg/dL (1-2 hours)      Critically ill patients:  140 - 180 mg/dL   Lab Results  Component Value Date   GLUCAP 166 (H) 11/06/2022   HGBA1C 6.6 (H) 04/27/2022    Review of Glycemic Control  Latest Reference Range & Units 11/05/22 06:02 11/05/22 11:04 11/05/22 16:07 11/05/22 20:58 11/06/22 06:51  Glucose-Capillary 70 - 99 mg/dL 376 (H) 283 (H) 151 (H) 255 (H) 166 (H)  (H): Data is abnormally high  Diabetes history: DM2 Outpatient Diabetes medications:  Toujeo 52 units every day Humalog 10-20 units TID Jardiance 25 mg QD Current orders for Inpatient glycemic control:  Novolog 0-9 units TID  Inpatient Diabetes Program Recommendations:    Postprandials elevated.  Please consider:  Carb modified diet  Novolog 3 units TID with meals if she consumes at least 50%  Will continue to follow while inpatient.  Thank you, Dulce Sellar, MSN, CDCES Diabetes Coordinator Inpatient Diabetes Program [phone] (team pager from 8a-5p)

## 2022-11-06 NOTE — Progress Notes (Signed)
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 -Recently hospitalized 7/24 for CHF in [facility] complicated by hypotension. -In the ED noted to be volume overloaded with 3+ edema, creatinine 1.89, BNP 3983, hemoglobin 8.1, chest x-ray noted pulmonary vascular congestion and right pleural effusion, CT chest noted large right pleural effusion and moderate left pleural effusion  -Admitted, started on diuretics, followed by cardiology -8/24 right thoracentesis 1.2 L drained  -Prognosis felt to be poor, palliative care was consulted  Subjective: -Feels weak and tired, denies dyspnea this morning  Assessment and Plan:  Acute on chronic combined systolic and diastolic CHF Moderate to severe mitral regurgitation Presumed CAD  -Echo 30 to 35% (worsening from 05/24), severe hypokinesis of the left ventricular mid apical inferoseptal wall, septal wall, lateral wall and apical segment. Small pericardial effusion, moderate to severe mitral regurgitation, RVSP 39.4  -CT chest shows significant coronary artery calcifications, -Followed by cardiology, on account of overall frailty has been primarily bedbound,, AKI/CKD cardiac catheterization was not pursued -Diuresed with IV Lasix, albumin used as well -Volume status has improved, GDMT limited by hypotension through this admission -Now on oral Lasix 40 Mg twice daily, continue metoprolol  -There have been palliative care/hospice discussions per previous MD and cards -Palliative care meeting today for goals of care, she was agreeable to DNR based on discussion yesterday  Acute hypoxemic respiratory failure, due to acute cardiogenic pulmonary edema. Right  pleural effusion sp thoracentesis, 1.2  L removed.  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  -Hemoglobin stable in the 7-8 range -Anemia panel 8/24 with iron deficiency, give IV iron  History of CVA (cerebrovascular accident) - Continue aspirin, ezetimibe and fibrate.   Hypothyroidism Continue levothyroxine   DVT prophylaxis: Lovenox Code Status: DNR Family Communication: none present Disposition Plan: TBD  Consultants:    Procedures:   Antimicrobials:    Objective: Vitals:   11/05/22 1927 11/05/22 2358 11/06/22 0440 11/06/22 0719  BP: (!) 116/47 (!) 104/45 (!) 113/51 (!) 111/47  Pulse: 88 (!) 101 95 93  Resp: 18 19 18 18   Temp: 98.5 F (36.9 C) 99 F (37.2 C) 98.8 F (37.1 C) 98.6 F (37 C)  TempSrc: Oral Oral Oral Oral  SpO2: 96% 92% 93% 94%  Weight:   56 kg   Height:        Intake/Output Summary (Last 24 hours) at 11/06/2022 1128 Last data filed at 11/06/2022 0272 Gross per 24 hour  Intake 420 ml  Output 650 ml  Net -230 ml   Filed Weights   11/04/22 0448 11/05/22 0440 11/06/22 0440  Weight: 55 kg 55.3 kg 56 kg    Examination:  Gen: Elderly chronically ill female sitting up in bed, AAOx3 HEENT: No JVD CVS: S1-S2, regular rhythm, systolic murmur Lungs: Decreased breath sounds at the bases Abdomen: Soft, nontender, bowel sounds present Extremities: No edema  Skin: no new rashes on exposed skin     Data Reviewed:   CBC: Recent Labs  Lab  10/31/22 0442 11/01/22 0340 11/05/22 0347  WBC 6.2 7.1 6.7  HGB 7.5* 7.7* 7.9*  HCT 23.4* 23.8* 24.4*  MCV 91.8 92.2 92.4  PLT 202 211 204   Basic Metabolic Panel: Recent Labs  Lab 11/02/22 0432 11/03/22 0423  11/04/22 0706 11/05/22 0347 11/06/22 0327  NA 131* 133* 133* 136 132*  K 3.5 3.3* 4.2 3.6 3.5  CL 97* 96* 97* 95* 92*  CO2 25 28 27 30 30   GLUCOSE 116* 134* 163* 132* 192*  BUN 35* 34* 29* 25* 27*  CREATININE 2.13* 2.19* 1.84* 1.70* 1.70*  CALCIUM 8.5* 9.0 9.2 9.5 9.3  MG 2.2 2.3  --   --   --    GFR: Estimated Creatinine Clearance: 23.7 mL/min (A) (by C-G formula based on SCr of 1.7 mg/dL (H)). Liver Function Tests: Recent Labs  Lab 11/01/22 0340 11/05/22 0347  AST 50* 37  ALT 19 13  ALKPHOS 39 35*  BILITOT 4.9* 4.5*  PROT 7.1 6.6  ALBUMIN 2.6* 2.5*   No results for input(s): "LIPASE", "AMYLASE" in the last 168 hours. No results for input(s): "AMMONIA" in the last 168 hours.  Coagulation Profile: No results for input(s): "INR", "PROTIME" in the last 168 hours. Cardiac Enzymes: No results for input(s): "CKTOTAL", "CKMB", "CKMBINDEX", "TROPONINI" in the last 168 hours. BNP (last 3 results) No results for input(s): "PROBNP" in the last 8760 hours. HbA1C: No results for input(s): "HGBA1C" in the last 72 hours. CBG: Recent Labs  Lab 11/05/22 1104 11/05/22 1607 11/05/22 2058 11/06/22 0651 11/06/22 1116  GLUCAP 254* 205* 255* 166* 237*   Lipid Profile: No results for input(s): "CHOL", "HDL", "LDLCALC", "TRIG", "CHOLHDL", "LDLDIRECT" in the last 72 hours. Thyroid Function Tests: No results for input(s): "TSH", "T4TOTAL", "FREET4", "T3FREE", "THYROIDAB" in the last 72 hours. Anemia Panel: No results for input(s): "VITAMINB12", "FOLATE", "FERRITIN", "TIBC", "IRON", "RETICCTPCT" in the last 72 hours. Urine analysis:    Component Value Date/Time   APPEARANCEUR Clear 10/28/2019 0900   GLUCOSEU 3+ (A) 10/28/2019 0900   BILIRUBINUR Negative 10/28/2019 0900   PROTEINUR Negative 10/28/2019 0900   NITRITE Negative 10/28/2019 0900   LEUKOCYTESUR Negative 10/28/2019 0900   Sepsis Labs: @LABRCNTIP (procalcitonin:4,lacticidven:4)  ) Recent Results (from the past 240  hour(s))  Resp panel by RT-PCR (RSV, Flu A&B, Covid) Anterior Nasal Swab     Status: None   Collection Time: 10/29/22  1:55 PM   Specimen: Anterior Nasal Swab  Result Value Ref Range Status   SARS Coronavirus 2 by RT PCR NEGATIVE NEGATIVE Final    Comment: (NOTE) SARS-CoV-2 target nucleic acids are NOT DETECTED.  The SARS-CoV-2 RNA is generally detectable in upper respiratory specimens during the acute phase of infection. The lowest concentration of SARS-CoV-2 viral copies this assay can detect is 138 copies/mL. A negative result does not preclude SARS-Cov-2 infection and should not be used as the sole basis for treatment or other patient management decisions. A negative result may occur with  improper specimen collection/handling, submission of specimen other than nasopharyngeal swab, presence of viral mutation(s) within the areas targeted by this assay, and inadequate number of viral copies(<138 copies/mL). A negative result must be combined with clinical observations, patient history, and epidemiological information. The expected result is Negative.  Fact Sheet for Patients:  BloggerCourse.com  Fact Sheet for Healthcare Providers:  SeriousBroker.it  This test is no t yet approved or cleared by the [place] FDA and  has been authorized for detection and/or diagnosis of SARS-CoV-2 by FDA under  an Emergency Use Authorization (EUA). This EUA will remain  in effect (meaning this test can be used) for the duration of the COVID-19 declaration under Section 564(b)(1) of the Act, 21 U.S.C.section 360bbb-3(b)(1), unless the authorization is terminated  or revoked sooner.       Influenza A by PCR NEGATIVE NEGATIVE Final   Influenza B by PCR NEGATIVE NEGATIVE Final    Comment: (NOTE) The Xpert Xpress SARS-CoV-2/FLU/RSV plus assay is intended as an aid in the diagnosis of influenza from Nasopharyngeal swab specimens and should not  be used as a sole basis for treatment. Nasal washings and aspirates are unacceptable for Xpert Xpress SARS-CoV-2/FLU/RSV testing.  Fact Sheet for Patients: BloggerCourse.com  Fact Sheet for Healthcare Providers: SeriousBroker.it  This test is not yet approved or cleared by the [place] FDA and has been authorized for detection and/or diagnosis of SARS-CoV-2 by FDA under an Emergency Use Authorization (EUA). This EUA will remain in effect (meaning this test can be used) for the duration of the COVID-19 declaration under Section 564(b)(1) of the Act, 21 U.S.C. section 360bbb-3(b)(1), unless the authorization is terminated or revoked.     Resp Syncytial Virus by PCR NEGATIVE NEGATIVE Final    Comment: (NOTE) Fact Sheet for Patients: BloggerCourse.com  Fact Sheet for Healthcare Providers: SeriousBroker.it  This test is not yet approved or cleared by the [place] FDA and has been authorized for detection and/or diagnosis of SARS-CoV-2 by FDA under an Emergency Use Authorization (EUA). This EUA will remain in effect (meaning this test can be used) for the duration of the COVID-19 declaration under Section 564(b)(1) of the Act, 21 U.S.C. section 360bbb-3(b)(1), unless the authorization is terminated or revoked.  Performed at [facility], [address], [place], [place] [address]      Radiology Studies: [place] CHEST (PLEURAL EFFUSION)  Result Date: 11/04/2022 CLINICAL DATA:  Pleural effusions EXAM: CHEST ULTRASOUND COMPARISON:  Chest x-ray 11/03/2022 FINDINGS: Bilateral pleural effusions are seen, moderate left and small right. IMPRESSION: Bilateral pleural effusions.  Moderate left and small right. Electronically Signed   By: Karen Kays M.D.   On: 11/04/2022 19:48     Scheduled Meds:  aspirin  81 mg Oral Daily   docusate sodium  100 mg Oral Daily    enoxaparin (LOVENOX) injection  30 mg Subcutaneous Q24H   ezetimibe  10 mg Oral Daily   feeding supplement  237 mL Oral BID BM   fenofibrate  160 mg Oral Daily   furosemide  40 mg Oral BID   insulin aspart  0-9 Units Subcutaneous TID WC   levothyroxine  88 mcg Oral QAC breakfast   pantoprazole  40 mg Oral BID   polyethylene glycol  17 g Oral Daily   Continuous Infusions:   LOS: 8 days    Time spent:    Zannie Cove, MD [facility]   11/06/2022, 11:28 AM

## 2022-11-07 DIAGNOSIS — I5043 Acute on chronic combined systolic (congestive) and diastolic (congestive) heart failure: Secondary | ICD-10-CM | POA: Diagnosis not present

## 2022-11-07 LAB — BASIC METABOLIC PANEL
Anion gap: 9 (ref 5–15)
BUN: 26 mg/dL — ABNORMAL HIGH (ref 8–23)
CO2: 31 mmol/L (ref 22–32)
Calcium: 9.3 mg/dL (ref 8.9–10.3)
Chloride: 93 mmol/L — ABNORMAL LOW (ref 98–111)
Creatinine, Ser: 1.63 mg/dL — ABNORMAL HIGH (ref 0.44–1.00)
GFR, Estimated: 32 mL/min — ABNORMAL LOW (ref >60)
Glucose, Bld: 193 mg/dL — ABNORMAL HIGH (ref 70–99)
Potassium: 3.5 mmol/L (ref 3.5–5.1)
Sodium: 133 mmol/L — ABNORMAL LOW (ref 135–145)

## 2022-11-07 LAB — CBC
HCT: 27.7 % — ABNORMAL LOW (ref 36.0–46.0)
Hemoglobin: 8.9 g/dL — ABNORMAL LOW (ref 12.0–15.0)
MCH: 29.8 pg (ref 26.0–34.0)
MCHC: 32.1 g/dL (ref 30.0–36.0)
MCV: 92.6 fL (ref 80.0–100.0)
Platelets: 237 10*3/uL (ref 150–400)
RBC: 2.99 MIL/uL — ABNORMAL LOW (ref 3.87–5.11)
RDW: 21.4 % — ABNORMAL HIGH (ref 11.5–15.5)
WBC: 6.3 10*3/uL (ref 4.0–10.5)
nRBC: 0 % (ref 0.0–0.2)

## 2022-11-07 LAB — GLUCOSE, CAPILLARY
Glucose-Capillary: 193 mg/dL — ABNORMAL HIGH (ref 70–99)
Glucose-Capillary: 261 mg/dL — ABNORMAL HIGH (ref 70–99)

## 2022-11-07 MED ORDER — TOUJEO MAX SOLOSTAR 300 UNIT/ML ~~LOC~~ SOPN: 26.0000 [IU] | PEN_INJECTOR | Freq: Every day | SUBCUTANEOUS | Status: DC

## 2022-11-07 MED ORDER — ONDANSETRON HCL 4 MG PO TABS: 4.0000 mg | ORAL_TABLET | Freq: Three times a day (TID) | ORAL | 0 refills | Status: DC | PRN

## 2022-11-07 MED ORDER — TRAMADOL HCL 50 MG PO TABS
50.0000 mg | ORAL_TABLET | Freq: Once | ORAL | Status: AC
  Administered 2022-11-07: 50 mg via ORAL
  Filled 2022-11-07: qty 1

## 2022-11-07 MED ORDER — FUROSEMIDE 20 MG PO TABS: 40.0000 mg | ORAL_TABLET | Freq: Every day | ORAL | Status: DC

## 2022-11-07 MED ORDER — TOUJEO MAX SOLOSTAR 300 UNIT/ML ~~LOC~~ SOPN: 26.0000 [IU] | PEN_INJECTOR | Freq: Every day | SUBCUTANEOUS | 0 refills | Status: DC

## 2022-11-07 MED ORDER — FUROSEMIDE 20 MG PO TABS: 40.0000 mg | ORAL_TABLET | Freq: Every day | ORAL | 1 refills | Status: DC

## 2022-11-07 NOTE — Discharge Summary (Addendum)
Physician Discharge Summary  Jessica Torres:[id number] DOB: 08/27/1940 DOA: 10/29/2022  PCP: Rebekah Chesterfield, NP  Admit date: 10/29/2022 Discharge date: 11/07/2022  Time spent: 45 minutes  Recommendations for Outpatient Follow-up:  PCP in 1 week, continue goals of care discussions, prognosis is poor Outpatient palliative care [facility] heart care 9/3   Discharge Diagnoses:  Principal Problem:   Acute on chronic combined systolic and diastolic CHF    Moderate to severe mitral regurgitation Liver cirrhosis Esophageal varices GAVE's disease Hypoalbuminemia Chronic anemia   Acute kidney injury on chronic kidney disease stage IV (HCC)   Type 2 diabetes mellitus with hyperlipidemia (HCC)   GAVE (gastric antral vascular ectasia)   History of CVA (cerebrovascular accident)   Hypothyroidism   Anemia DO NOT RESUSCITATE   Discharge Condition: Stable  Diet recommendation: Low so DM, diabetic  Filed Weights   11/05/22 0440 11/06/22 0440 11/07/22 0519  Weight: 55.3 kg 56 kg 57.5 kg    History of present illness:  81/F with history of chronic systolic CHF, EF 63%, CAD, peripheral vascular disease, chronic blood loss anemia from GAVE disease, type 2 diabetes mellitus, CKD 3, hypothyroidism presented to the ED with cough orthopnea and dyspnea on exertion. -Recently hospitalized 7/24 for CHF in [facility] complicated by hypotension. -In the ED noted to be volume overloaded with 3+ edema, creatinine 1.89, BNP 3983, hemoglobin 8.1, chest x-ray noted pulmonary vascular congestion and right pleural effusion, CT chest noted large right pleural effusion and moderate left pleural effusion   Hospital Course:   Acute on chronic combined systolic and diastolic CHF Moderate to severe mitral regurgitation Presumed CAD  -Echo 30 to 35% (worsening from 05/24), severe hypokinesis of the left ventricular mid apical inferoseptal wall, septal wall, lateral wall and apical segment. Mod to severe mitral  regurgitation -CT chest shows significant coronary artery calcifications, -Followed by cardiology, on account of overall frailty has been primarily bedbound,, AKI/CKD cardiac catheterization was not pursued -Diuresed with IV Lasix, albumin used as well -Volume status has improved, GDMT limited by hypotension and CKD -Now transitioned to oral Lasix, Jardiance resumed, continue metoprolol  -Palliative care has been involved in her care for goals of care discussions on account of frailty and significant chronic disease burden, family meeting completed yesterday, patient remains DNR, not open to hospice at this time, will need further discussions down the road as she deteriorates further   Acute hypoxemic respiratory failure, due to acute cardiogenic pulmonary edema. Right pleural effusion sp thoracentesis, 1.2  L removed.  -Diuretics as above -Weaned off O2   Acute kidney injury on chronic kidney disease stage IV (HCC) Cardiorenal syndrome, hyponatremia, hypokalemia.  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  -Hemoglobin stable in the 7-8 range -Anemia panel 8/24 with iron deficiency, given IV iron   History of CVA (cerebrovascular accident) - Continue aspirin, ezetimibe and fibrate.    Hypothyroidism Continue levothyroxine    Consultations: Palliative care, cardiology  Discharge Exam: Vitals:   11/07/22 0759 11/07/22 0800  BP: (!) 116/48   Pulse: 98   Resp: (!) 21   Temp: 98.7 F (37.1 C)   SpO2: 94% 94%   Gen: Elderly chronically ill female sitting up in bed, AAOx3 HEENT: No JVD CVS: S1-S2, regular rhythm, systolic murmur Lungs: Decreased breath sounds at  the bases Abdomen: Soft, nontender, bowel sounds present Extremities: No edema   Skin: no new rashes on exposed skin   Discharge Instructions   Discharge Instructions     Amb Referral to Palliative Care   Complete by: As directed    Diet - low sodium heart healthy   Complete by: As directed    Diet Carb Modified   Complete by: As directed    Increase activity slowly   Complete by: As directed       Allergies as of 11/07/2022       Reactions   Morphine And Codeine Swelling, Other (See Comments)   Facial swelling   Latex Rash   Statins Other (See Comments)   Myalgias        Medication List     STOP taking these medications    carvedilol 3.125 MG tablet Commonly known as: COREG   dapagliflozin propanediol 10 MG Tabs tablet Commonly known as: FARXIGA   metoprolol succinate 25 MG 24 hr tablet Commonly known as: TOPROL-XL       TAKE these medications    Accu-Chek Softclix Lancets lancets Test BS TID Dx E11.9   aspirin 81 MG chewable tablet Chew 1 tablet (81 mg total) by mouth daily.   BD Pen Needle Nano 2nd Gen 32G X 4 MM Misc Generic drug: Insulin Pen Needle Use twice daily with insulin Dx E11.9   empagliflozin 25 MG Tabs tablet Commonly known as: JARDIANCE Take 25 mg by mouth daily.   ezetimibe 10 MG tablet Commonly known as: ZETIA Take 1 tablet (10 mg total) by mouth daily.   fenofibrate 160 MG tablet TAKE 1 TABLET DAILY   FREESTYLE LITE test strip Generic drug: glucose blood Test BS TID Dx E11.9   furosemide 20 MG tablet Commonly known as: LASIX Take 2 tablets (40 mg total) by mouth daily. Take 2tabs in morning and take an extra dose as needed for weight gain of 3lb in 1 day or 5lb in 1 week What changed:  how much to take additional instructions   insulin lispro 100 UNIT/ML KwikPen Commonly known as: HUMALOG Inject 10-20 Units into the skin 3 (three) times daily.   levothyroxine 88 MCG tablet Commonly known as: SYNTHROID TAKE 1 TABLET DAILY BEFORE BREAKFAST   pantoprazole 40 MG tablet Commonly known as:  PROTONIX Take 1 tablet (40 mg total) by mouth 2 (two) times daily. Follow up with gastroenterology for additional recommendations   Toujeo Max SoloStar 300 UNIT/ML Solostar Pen Generic drug: insulin glargine (2 Unit Dial) Inject 26 Units into the skin at bedtime. INJECT 52 UNITS UNDER THE SKIN DAILY What changed: See the new instructions.       Allergies  Allergen Reactions   Morphine And Codeine Swelling and Other (See Comments)    Facial swelling   Latex Rash   Statins Other (See Comments)    Myalgias     Follow-up Information     [facility]. Go in 11 day(s).   Specialty: Cardiology Why: Hospital follow up 11/11/2022 @ 12 noon PLEASE bring a current medication list to appointment FREE valet parking, Entrance C, off [employer] information: [address] [place] [address] [phone]        [facility] Follow up.   Specialty: Home Health Services Why: Agency will call you to set up apt times Contact information: [address] [place] [address] [phone]  Rebekah Chesterfield, NP. Go on 11/17/2022.   Specialty: Internal Medicine Why: @2 :45pm Contact information: [address] [place] [address] [phone]                  The results of significant diagnostics from this hospitalization (including imaging, microbiology, ancillary and laboratory) are listed below for reference.    Significant Diagnostic Studies: [place] CHEST (PLEURAL EFFUSION)  Result Date: 11/04/2022 CLINICAL DATA:  Pleural effusions EXAM: CHEST ULTRASOUND COMPARISON:  Chest x-ray 11/03/2022 FINDINGS: Bilateral pleural effusions are seen, moderate left and small right. IMPRESSION: Bilateral pleural effusions.  Moderate left and small right. Electronically Signed   By: Karen Kays M.D.   On: 11/04/2022 19:48   DG Chest Left Decubitus  Result Date: 11/03/2022 CLINICAL  DATA:  Left pleural effusion. EXAM: CHEST - LEFT DECUBITUS COMPARISON:  November 01, 2022. FINDINGS: Small left pleural effusion noted on prior radiograph is not significantly changed suggesting possible loculation. Left basilar opacity is noted concerning for atelectasis or infiltrate. Probable small loculated right pleural effusion is noted with associated opacity. IMPRESSION: See above. Electronically Signed   By: Lupita Raider M.D.   On: 11/03/2022 17:46   ECHOCARDIOGRAM LIMITED  Result Date: 11/01/2022    ECHOCARDIOGRAM LIMITED REPORT   Patient Name:   Jessica Torres Date of Exam: 11/01/2022 Medical Rec #:  [id number]      Height:       65.0 in Accession #:    [id number]     Weight:       133.6 lb Date of Birth:  26-Nov-1940      BSA:          1.666 m Patient Age:    37 years       BP:           111/51 mmHg Patient Gender: F              HR:           79 bpm. Exam Location:  Inpatient Procedure: Limited Echo, Cardiac Doppler and Limited Color Doppler Indications:    CHF-Acute Systolic I50.21  History:        Patient has prior history of Echocardiogram examinations, most                 recent 07/14/2022. CAD and Previous Myocardial Infarction, PAD,                 Cirrhosis, Carotid Disease and Stroke, Signs/Symptoms:Murmur;                 Risk Factors:Dyslipidemia, Diabetes, Hypertension and                 Non-Smoker.  Sonographer:    Aron Baba Referring Phys: Little Ishikawa IMPRESSIONS  1. Limited study  2. Left ventricular ejection fraction, by estimation, is 30 to 35%. Left ventricular ejection fraction by 2D MOD biplane is 32.2 %. The left ventricle has moderately decreased function. The left ventricle demonstrates regional wall motion abnormalities (see scoring diagram/findings for description). diastolic function is indeterminate due to lack of tissue doppler. There is severe hypokinesis of the left ventricular, mid-apical inferoseptal wall, septal wall, lateral wall and apical segment.  3. A  small pericardial effusion is present.  4. Moderate to severe mitral valve regurgitation.  5. The aortic root is heaily calcified, but there does not appears to be significant aortic stenosis. Doppler velocities were not obtained.  6. There  is mildly elevated pulmonary artery systolic pressure. The estimated right ventricular systolic pressure is 39.4 mmHg.  7. The inferior vena cava is dilated in size with <50% respiratory variability, suggesting right atrial pressure of 15 mmHg. Comparison(s): Changes from prior study are noted. 07/14/2022: LVEF 40-45%, mid/distal lateral, mid/distal septal, distal inferior and apical walls. FINDINGS  Left Ventricle: Left ventricular ejection fraction, by estimation, is 30 to 35%. Left ventricular ejection fraction by 2D MOD biplane is 32.2 %. The left ventricle has moderately decreased function. The left ventricle demonstrates regional wall motion abnormalities. Severe hypokinesis of the left ventricular, mid-apical inferoseptal wall, septal wall, lateral wall and apical segment. The left ventricular internal cavity size was normal in size. There is no left ventricular hypertrophy. Diastolic function is indeterminate due to lack of tissue doppler. Right Ventricle: There is mildly elevated pulmonary artery systolic pressure. The tricuspid regurgitant velocity is 2.47 m/s, and with an assumed right atrial pressure of 15 mmHg, the estimated right ventricular systolic pressure is 39.4 mmHg. Pericardium: A small pericardial effusion is present. Mitral Valve: Moderate to severe mitral valve regurgitation, with centrally-directed jet. Tricuspid Valve: The tricuspid valve is grossly normal. Tricuspid valve regurgitation is mild. Aortic Valve: The aortic root is heaily calcified, but there does not appears to be significant aortic stenosis. Doppler velocities were not obtained. Venous: The inferior vena cava is dilated in size with less than 50% respiratory variability, suggesting right  atrial pressure of 15 mmHg. Additional Comments: There is a small pleural effusion in the left lateral region. Spectral Doppler performed. Color Doppler performed.  LEFT VENTRICLE PLAX 2D                        Biplane EF (MOD) LVIDd:         4.70 cm         LV Biplane EF:   Left LVIDs:         3.90 cm                          ventricular LV PW:         0.80 cm                          ejection LV IVS:        0.60 cm                          fraction by                                                 2D MOD                                                 biplane is LV Volumes (MOD)                                32.2 %. LV vol d, MOD    134.0 ml A2C: LV vol d, MOD    108.0 ml A4C: LV vol s, MOD  83.3 ml A2C: LV vol s, MOD    76.3 ml A4C: LV SV MOD A2C:   50.7 ml LV SV MOD A4C:   108.0 ml LV SV MOD BP:    38.7 ml RIGHT VENTRICLE TAPSE (M-mode): 1.6 cm LEFT ATRIUM         Index LA diam:    3.50 cm 2.10 cm/m  MITRAL VALVE                TRICUSPID VALVE MV Area (PHT): 4.31 cm     TR Peak grad:   24.4 mmHg MV Decel Time: 176 msec     TR Vmax:        247.00 cm/s MR Peak grad: 65.6 mmHg MR Vmax:      405.00 cm/s MV E velocity: 164.00 cm/s MV A velocity: 73.10 cm/s MV E/A ratio:  2.24 Zoila Shutter MD Electronically signed by Zoila Shutter MD Signature Date/Time: 11/01/2022/3:00:23 PM    Final    US THORACENTESIS ASP PLEURAL SPACE W/IMG GUIDE  Result Date: 11/01/2022 INDICATION: 82 year old female presents with shortness of breath, previous imaging showed bilateral pleural effusion right greater than left. Request for therapeutic and diagnostic thoracentesis. EXAM: ULTRASOUND GUIDED RIGHT THORACENTESIS MEDICATIONS: 5 mL 1% lidocaine COMPLICATIONS: None immediate. PROCEDURE: An ultrasound guided thoracentesis was thoroughly discussed with the patient and questions answered. The benefits, risks, alternatives and complications were also discussed. The patient understands and wishes to proceed with the procedure. Written  consent was obtained. Ultrasound was performed to localize and mark an adequate pocket of fluid in the right chest. The area was then prepped and draped in the normal sterile fashion. 1% Lidocaine was used for local anesthesia. Under ultrasound guidance a 6 Fr Safe-T-Centesis catheter was introduced. Thoracentesis was performed. The catheter was removed and a dressing applied. FINDINGS: A total of approximately 1.1 L of hazy yellow fluid was removed. Samples were sent to the laboratory as requested by the clinical team. Postprocedure chest x-ray ordered, results pending. IMPRESSION: Successful ultrasound guided right thoracentesis yielding 1.1 L of pleural fluid. Performed by: Lawernce Ion, PA-C Electronically Signed   By: Judie Petit.  Shick M.D.   On: 11/01/2022 12:57   DG Chest 1 View  Result Date: 11/01/2022 CLINICAL DATA:  Status post right thoracentesis. EXAM: CHEST  1 VIEW COMPARISON:  Two-view chest x-ray 10/29/2022 FINDINGS: The heart is enlarged. The right pleural effusion is markedly decreased. No significant pneumothorax is present. A left pleural effusion remains. Left basilar airspace opacity is noted. Mild pulmonary vascular congestion is present. The visualized soft tissues and bony thorax are unremarkable. IMPRESSION: 1. Marked decrease in right pleural effusion without significant pneumothorax. 2. Persistent left pleural effusion and basilar airspace disease. 3. Cardiomegaly and mild pulmonary vascular congestion. Electronically Signed   By: Marin Roberts M.D.   On: 11/01/2022 12:08   CT CHEST WO CONTRAST  Result Date: 10/31/2022 CLINICAL DATA:  Diffuse/interstitial lung disease EXAM: CT CHEST WITHOUT CONTRAST TECHNIQUE: Multidetector CT imaging of the chest was performed following the standard protocol without IV contrast. RADIATION DOSE REDUCTION: This exam was performed according to the departmental dose-optimization program which includes automated exposure control, adjustment of the mA  and/or kV according to patient size and/or use of iterative reconstruction technique. COMPARISON:  Chest radiograph 10/29/2022 FINDINGS: Cardiovascular: Aortic atherosclerosis without aneurysm. The heart is enlarged. There are coronary artery calcifications. No pericardial effusion. Decreased density of the blood pool suggests anemia. Mediastinum/Nodes: Shotty mediastinal nodes including an 11 mm anterior paratracheal node. Hilar  assessment is limited in the absence of IV contrast. Esophagus is grossly normal, partially obscured by adjacent pleural effusions. No thyroid nodule. Lungs/Pleura: Moderate to large right and moderate left pleural effusion. Assessment of lung parenchyma and interstitial lung disease is limited in the setting of pleural effusions and motion artifact. Subtotal atelectasis and opacification of both lower lobes, compressive atelectasis within the dependent right upper lobe. Additional ground-glass opacities in both lower lobes in the periphery of the right upper lobe. No bronchiectasis. Trachea and central airways are clear. Upper Abdomen: Nodular hepatic contour suggestive of cirrhosis. No acute upper abdominal findings. Musculoskeletal: There are no acute or suspicious osseous abnormalities. No chest wall soft tissue abnormalities. IMPRESSION: 1. Moderate to large right and moderate left pleural effusion. Subtotal atelectasis and opacification of both lower lobes, compressive atelectasis within the dependent right upper lobe. Additional ground-glass opacities in both lower lobes in the periphery of the right upper lobe. Findings may be combination of compressive atelectasis and pulmonary edema or infection. 2. Cardiomegaly with coronary artery calcifications. 3. Nodular hepatic contour suggestive of cirrhosis. 4. Shotty mediastinal nodes are likely reactive. Aortic Atherosclerosis (ICD10-I70.0). Electronically Signed   By: Narda Rutherford M.D.   On: 10/31/2022 16:14   DG Chest 2  View  Result Date: 10/29/2022 CLINICAL DATA:  Cough and shortness of breath EXAM: CHEST - 2 VIEW COMPARISON:  X-ray 04/27/2022 FINDINGS: Underinflation. Bilateral pleural effusions and adjacent opacities. No pneumothorax. Vascular congestion. Mild interstitial prominence. Component of edema is not excluded. Recommend follow-up. Overlapping cardiac leads IMPRESSION: Developing bilateral pleural effusions with the adjacent opacities and interstitial changes. Recommend follow-up Electronically Signed   By: Karen Kays M.D.   On: 10/29/2022 13:56    Microbiology: Recent Results (from the past 240 hour(s))  Resp panel by RT-PCR (RSV, Flu A&B, Covid) Anterior Nasal Swab     Status: None   Collection Time: 10/29/22  1:55 PM   Specimen: Anterior Nasal Swab  Result Value Ref Range Status   SARS Coronavirus 2 by RT PCR NEGATIVE NEGATIVE Final    Comment: (NOTE) SARS-CoV-2 target nucleic acids are NOT DETECTED.  The SARS-CoV-2 RNA is generally detectable in upper respiratory specimens during the acute phase of infection. The lowest concentration of SARS-CoV-2 viral copies this assay can detect is 138 copies/mL. A negative result does not preclude SARS-Cov-2 infection and should not be used as the sole basis for treatment or other patient management decisions. A negative result may occur with  improper specimen collection/handling, submission of specimen other than nasopharyngeal swab, presence of viral mutation(s) within the areas targeted by this assay, and inadequate number of viral copies(<138 copies/mL). A negative result must be combined with clinical observations, patient history, and epidemiological information. The expected result is Negative.  Fact Sheet for Patients:  BloggerCourse.com  Fact Sheet for Healthcare Providers:  SeriousBroker.it  This test is no t yet approved or cleared by the [place] FDA and  has been authorized  for detection and/or diagnosis of SARS-CoV-2 by FDA under an Emergency Use Authorization (EUA). This EUA will remain  in effect (meaning this test can be used) for the duration of the COVID-19 declaration under Section 564(b)(1) of the Act, 21 U.S.C.section 360bbb-3(b)(1), unless the authorization is terminated  or revoked sooner.       Influenza A by PCR NEGATIVE NEGATIVE Final   Influenza B by PCR NEGATIVE NEGATIVE Final    Comment: (NOTE) The Xpert Xpress SARS-CoV-2/FLU/RSV plus assay is intended as an aid in the  diagnosis of influenza from Nasopharyngeal swab specimens and should not be used as a sole basis for treatment. Nasal washings and aspirates are unacceptable for Xpert Xpress SARS-CoV-2/FLU/RSV testing.  Fact Sheet for Patients: BloggerCourse.com  Fact Sheet for Healthcare Providers: SeriousBroker.it  This test is not yet approved or cleared by the [place] FDA and has been authorized for detection and/or diagnosis of SARS-CoV-2 by FDA under an Emergency Use Authorization (EUA). This EUA will remain in effect (meaning this test can be used) for the duration of the COVID-19 declaration under Section 564(b)(1) of the Act, 21 U.S.C. section 360bbb-3(b)(1), unless the authorization is terminated or revoked.     Resp Syncytial Virus by PCR NEGATIVE NEGATIVE Final    Comment: (NOTE) Fact Sheet for Patients: BloggerCourse.com  Fact Sheet for Healthcare Providers: SeriousBroker.it  This test is not yet approved or cleared by the [place] FDA and has been authorized for detection and/or diagnosis of SARS-CoV-2 by FDA under an Emergency Use Authorization (EUA). This EUA will remain in effect (meaning this test can be used) for the duration of the COVID-19 declaration under Section 564(b)(1) of the Act, 21 U.S.C. section 360bbb-3(b)(1), unless the authorization is  terminated or revoked.  Performed at [facility], [address], [place], [place] [address]      Labs: Basic Metabolic Panel: Recent Labs  Lab 11/02/22 [phone] 11/03/22 0423 11/04/22 0706 11/05/22 0347 11/06/22 0327 11/07/22 0637  NA 131* 133* 133* 136 132* 133*  K 3.5 3.3* 4.2 3.6 3.5 3.5  CL 97* 96* 97* 95* 92* 93*  CO2 25 28 27 30 30 31   GLUCOSE 116* 134* 163* 132* 192* 193*  BUN 35* 34* 29* 25* 27* 26*  CREATININE 2.13* 2.19* 1.84* 1.70* 1.70* 1.63*  CALCIUM 8.5* 9.0 9.2 9.5 9.3 9.3  MG 2.2 2.3  --   --   --   --    Liver Function Tests: Recent Labs  Lab 11/01/22 0340 11/05/22 0347  AST 50* 37  ALT 19 13  ALKPHOS 39 35*  BILITOT 4.9* 4.5*  PROT 7.1 6.6  ALBUMIN 2.6* 2.5*   No results for input(s): "LIPASE", "AMYLASE" in the last 168 hours. No results for input(s): "AMMONIA" in the last 168 hours. CBC: Recent Labs  Lab 11/01/22 0340 11/05/22 0347 11/07/22 0637  WBC 7.1 6.7 6.3  HGB 7.7* 7.9* 8.9*  HCT 23.8* 24.4* 27.7*  MCV 92.2 92.4 92.6  PLT 211 204 237   Cardiac Enzymes: No results for input(s): "CKTOTAL", "CKMB", "CKMBINDEX", "TROPONINI" in the last 168 hours. BNP: BNP (last 3 results) Recent Labs    04/27/22 0800 10/29/22 1222  BNP 499.5* 3,983.8*    ProBNP (last 3 results) No results for input(s): "PROBNP" in the last 8760 hours.  CBG: Recent Labs  Lab 11/06/22 0651 11/06/22 1116 11/06/22 1604 11/06/22 2125 11/07/22 0623  GLUCAP 166* 237* 187* 263* 193*       Signed:  Zannie Cove MD.  [facility] 11/07/2022, 11:12 AM

## 2022-11-07 NOTE — Progress Notes (Signed)
Physical Therapy Treatment Patient Details Name: Jessica Torres MRN: [id number] DOB: 07/13/1940 Today's Date: 11/07/2022   History of Present Illness Pt is a 82 y.o. female admitted 8/21 with CHF. She underwent R thoracentesis 8/24. 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  Emphasis on safety in general, coaching for efficient breathing, sit to stands with cues for hand placement and progression of gait stability/stamina with use of the RW.  On 4L [place] during gait, SpO2 rose slowly from 90 to 95%,  HR 100 or less.     If plan is discharge home, recommend the following: A little help with walking and/or transfers;A little help with bathing/dressing/bathroom;Assistance with cooking/housework;Assist for transportation;Help with stairs or ramp for entrance   Can travel by private vehicle        Equipment Recommendations  Other (comment)    Recommendations for Other Services       Precautions / Restrictions Precautions Precautions: Fall Precaution Comments: watch sats     Mobility  Bed Mobility               General bed mobility comments: in recliner    Transfers Overall transfer level: Needs assistance Equipment used: Rolling walker (2 wheels) Transfers: Sit to/from Stand Sit to Stand: Contact guard assist           General transfer comment: cues for safety/hand placement on arm rests acending/descending    Ambulation/Gait Ambulation/Gait assistance: Contact guard assist, Min assist Gait Distance (Feet): 360 Feet Assistive device: Rolling walker (2 wheels) Gait Pattern/deviations: Step-through pattern Gait velocity: decreased Gait velocity interpretation: <1.8 ft/sec, indicate of risk for recurrent falls   General Gait Details: generally steady with improving posture and vitals as she  progressed.  Initially 3L [place] with sats slowly dropping to 90%, then 4L [place] with sats rising slowly to 95%,  HR no more than 100 bpm   Stairs             Wheelchair Mobility     Tilt Bed    Modified Rankin (Stroke Patients Only)       Balance     Sitting balance-Leahy Scale: Good       Standing balance-Leahy Scale: Fair                              Cognition Arousal: Alert Behavior During Therapy: WFL for tasks assessed/performed Overall Cognitive Status: Within Functional Limits for tasks assessed                                          Exercises      General Comments        Pertinent Vitals/Pain Pain Assessment Pain Assessment: 0-10 Pain Intervention(s): Monitored during session    Home Living                          Prior Function            PT Goals (current goals can now be found in the care plan section) Acute Rehab PT Goals Patient Stated Goal: home PT Goal Formulation: With patient Time For Goal Achievement: 11/16/22 Potential  to Achieve Goals: Fair Progress towards PT goals: Progressing toward goals    Frequency    Min 1X/week      PT Plan      Co-evaluation              AM-PAC PT "6 Clicks" Mobility   Outcome Measure  Help needed turning from your back to your side while in a flat bed without using bedrails?: A Little Help needed moving from lying on your back to sitting on the side of a flat bed without using bedrails?: A Little Help needed moving to and from a bed to a chair (including a wheelchair)?: A Little Help needed standing up from a chair using your arms (e.g., wheelchair or bedside chair)?: A Little Help needed to walk in hospital room?: A Little Help needed climbing 3-5 steps with a railing? : A Little 6 Click Score: 18    End of Session Equipment Utilized During Treatment: Oxygen Activity Tolerance: Patient tolerated treatment well Patient left: in chair;with  call bell/phone within reach;with family/visitor present Nurse Communication: Mobility status PT Visit Diagnosis: Difficulty in walking, not elsewhere classified (R26.2)     Time: 0630-1601 PT Time Calculation (min) (ACUTE ONLY): 23 min  Charges:    $Gait Training: 8-22 mins $Therapeutic Activity: 8-22 mins PT General Charges $$ ACUTE PT VISIT: 1 Visit                     11/07/2022  Jacinto Halim., PT Acute Rehabilitation Services [phone]  (office)   Jessica Torres 11/07/2022, 2:20 PM

## 2022-11-07 NOTE — Plan of Care (Signed)

## 2022-11-07 NOTE — Plan of Care (Signed)
  Problem: Education: Goal: Knowledge of General Education information will improve Description: Including pain rating scale, medication(s)/side effects and non-pharmacologic comfort measures Outcome: Progressing   Problem: Health Behavior/Discharge Planning: Goal: Ability to manage health-related needs will improve Outcome: Progressing   Problem: Clinical Measurements: Goal: Cardiovascular complication will be avoided Outcome: Progressing   Problem: Clinical Measurements: Goal: Respiratory complications will improve Outcome: Progressing   Problem: Activity: Goal: Risk for activity intolerance will decrease Outcome: Progressing   Problem: Nutrition: Goal: Adequate nutrition will be maintained Outcome: Progressing   Problem: Pain Managment: Goal: General experience of comfort will improve Outcome: Progressing

## 2022-11-07 NOTE — Care Management Important Message (Signed)
Important Message  Patient Details  Name: Jessica Torres MRN: [id number] Date of Birth: 1940-03-12   Medicare Important Message Given:  Yes     Renie Ora 11/07/2022, 11:06 AM

## 2022-11-07 NOTE — TOC Transition Note (Addendum)
Transition of Care [facility]) - CM/SW Discharge Note   Patient Details  Name: Jessica Torres MRN: [id number] Date of Birth: 01-09-1941  Transition of Care [facility]) CM/SW Contact:  Leone Haven, RN Phone Number: 11/07/2022, 9:57 AM   Clinical Narrative:    Patient is for dc home today, NCM notified Brandi with [facility]. She has transport home.  Patient now needs home oxygen, son in the room states he has no preference of agency.  NCM made referral to [employer] for home oxygen, this will be delivered to the room and the set up will be done at her sister's [facility] [address] [place].  Oxygen tank will be brought up to room prior to dc. Also order in to eval for POC , [employer] will eval patient for a portable oxygen.     Barriers to Discharge: Continued Medical Work up   Patient Goals and CMS Choice CMS Medicare.gov Compare Post Acute Care list provided to:: Patient Choice offered to / list presented to : Patient  Discharge Placement                         Discharge Plan and Services Additional resources added to the After Visit Summary for   In-house Referral: NA Discharge Planning Services: CM Consult Post Acute Care Choice: Home Health, Resumption of Svcs/PTA Provider          DME Arranged: N/A         HH Arranged: RN, PT, OT HH Agency: [facility] Date HH Agency Contacted: 10/31/22 Time HH Agency Contacted: 1707 Representative spoke with at [facility] Agency: Merry Proud  Social Determinants of Health (SDOH) Interventions SDOH Screenings   Food Insecurity: No Food Insecurity (10/29/2022)  Housing: Low Risk  (10/30/2022)  Transportation Needs: No Transportation Needs (10/30/2022)  Utilities: Not At Risk (10/29/2022)  Alcohol Screen: Low Risk  (10/30/2022)  Depression (PHQ2-9): Medium Risk (02/21/2022)  Financial Resource Strain: Low Risk  (10/30/2022)  Physical Activity: Inactive (09/25/2020)  Social Connections: Unknown (08/27/2022)   Received from [facility]  Stress: No Stress Concern Present (10/12/2022)   Received from [facility]  Tobacco Use: Low Risk  (10/29/2022)     Readmission Risk Interventions    10/31/2022    5:12 PM  Readmission Risk Prevention Plan  Transportation Screening Complete  HRI or Home Care Consult Complete  Palliative Care Screening Not Applicable  Medication Review (RN Care Manager) Complete

## 2022-11-07 NOTE — Progress Notes (Signed)
Approximately 1545--Pt discharged to home. At time of discharge, pt A&O x 4 and VSS. All PIVs removed. AVS discharge instructions provided to pt and family at bedside with all questions answered. Teach-back technique utilized. All patient belongings taken with pt--pt verified. Home O2 transported home with pt. Pt transported with son via personal car. Telemetry disconnected and notified.

## 2022-11-07 NOTE — Progress Notes (Signed)
Mobility Specialist Progress Note:   11/07/22 1036  Mobility  Activity Transferred from bed to chair  Level of Assistance Minimal assist, patient does 75% or more  Assistive Device Front wheel walker  Activity Response Tolerated well  Mobility Referral Yes  $Mobility charge 1 Mobility  Mobility Specialist Start Time (ACUTE ONLY) [id number]  Mobility Specialist Stop Time (ACUTE ONLY) 1009  Mobility Specialist Time Calculation (min) (ACUTE ONLY) 11 min   Pt received supine in bed, declined ambulating in halls d/t tiredness but agreeable to transfer to chair. Pt needed MinA for bed mobility, contact guard for STS and transfer. Pt was able to take a couple steps towards the chair w/o fault. Situated in chair w/ call bell and personal belongings in reach. All needs met.  Thompson Grayer Mobility Specialist  Please contact vis Secure Chat or  Rehab Office [phone]

## 2022-11-07 NOTE — Progress Notes (Signed)
   11/07/22 1140  Resting  Supplemental oxygen during test? Yes  O2 Flow Rate (L/min) 3 L/min  Type Continuous  Resting Heart Rate 70  Resting Sp02 97  Lap 1 (250 feet)  HR 75  02 Sat 97   Patient began ambulating in room on RA. Pt was only able to take a few steps before SpO2 dropped to 83%, and pt became SOB. Supplemental oxygen applied and titrated to 3L O2. Pt SpO2 returned to 97%.

## 2022-11-07 NOTE — Progress Notes (Signed)
[facility] will sign off.   Medication Recommendations:  As on MAR Other recommendations (labs, testing, etc):  None Follow up as an outpatient:  As scheduled

## 2022-11-07 NOTE — Progress Notes (Signed)
This chaplain responded to PMT [facility] consult for creating/updating the Pt. Advance Directive. The chaplain understands the Pt. is preparing for discharge.   The Pt. son-Chris is at the bedside and received the incomplete AD application from the chaplain.The chaplain provided education on how to complete an AD outside the hospital.   The Pt. and family were appreciative of the visit. F/U spiritual care is available as needed.  Chaplain Stephanie Acre [phone]

## 2022-11-11 ENCOUNTER — Encounter (HOSPITAL_COMMUNITY): Payer: Medicare HMO

## 2022-11-12 ENCOUNTER — Telehealth: Payer: Self-pay | Admitting: Cardiovascular Disease

## 2022-11-12 ENCOUNTER — Emergency Department (HOSPITAL_BASED_OUTPATIENT_CLINIC_OR_DEPARTMENT_OTHER): Payer: Medicare HMO

## 2022-11-12 ENCOUNTER — Encounter (HOSPITAL_COMMUNITY): Payer: Medicare HMO

## 2022-11-12 ENCOUNTER — Inpatient Hospital Stay (HOSPITAL_BASED_OUTPATIENT_CLINIC_OR_DEPARTMENT_OTHER)
Admission: EM | Admit: 2022-11-12 | Discharge: 2022-11-15 | DRG: 291 | Disposition: A | Discharge status: Home Health | Payer: Medicare HMO | Attending: Internal Medicine | Admitting: Internal Medicine

## 2022-11-12 ENCOUNTER — Other Ambulatory Visit: Payer: Self-pay

## 2022-11-12 ENCOUNTER — Encounter (HOSPITAL_BASED_OUTPATIENT_CLINIC_OR_DEPARTMENT_OTHER): Payer: Self-pay

## 2022-11-12 ENCOUNTER — Ambulatory Visit: Payer: Medicare HMO | Admitting: Student

## 2022-11-12 DIAGNOSIS — K766 Portal hypertension: Secondary | ICD-10-CM | POA: Diagnosis present

## 2022-11-12 DIAGNOSIS — J9611 Chronic respiratory failure with hypoxia: Secondary | ICD-10-CM | POA: Diagnosis present

## 2022-11-12 DIAGNOSIS — Z79890 Hormone replacement therapy (postmenopausal): Secondary | ICD-10-CM

## 2022-11-12 DIAGNOSIS — Z7401 Bed confinement status: Secondary | ICD-10-CM

## 2022-11-12 DIAGNOSIS — K219 Gastro-esophageal reflux disease without esophagitis: Secondary | ICD-10-CM | POA: Diagnosis present

## 2022-11-12 DIAGNOSIS — E1122 Type 2 diabetes mellitus with diabetic chronic kidney disease: Secondary | ICD-10-CM | POA: Diagnosis present

## 2022-11-12 DIAGNOSIS — Z91040 Latex allergy status: Secondary | ICD-10-CM

## 2022-11-12 DIAGNOSIS — I2510 Atherosclerotic heart disease of native coronary artery without angina pectoris: Secondary | ICD-10-CM | POA: Diagnosis present

## 2022-11-12 DIAGNOSIS — Z8249 Family history of ischemic heart disease and other diseases of the circulatory system: Secondary | ICD-10-CM

## 2022-11-12 DIAGNOSIS — D696 Thrombocytopenia, unspecified: Secondary | ICD-10-CM | POA: Diagnosis present

## 2022-11-12 DIAGNOSIS — K7460 Unspecified cirrhosis of liver: Secondary | ICD-10-CM | POA: Diagnosis present

## 2022-11-12 DIAGNOSIS — I8510 Secondary esophageal varices without bleeding: Secondary | ICD-10-CM | POA: Diagnosis present

## 2022-11-12 DIAGNOSIS — I2583 Coronary atherosclerosis due to lipid rich plaque: Secondary | ICD-10-CM | POA: Diagnosis not present

## 2022-11-12 DIAGNOSIS — Z66 Do not resuscitate: Secondary | ICD-10-CM | POA: Diagnosis present

## 2022-11-12 DIAGNOSIS — Z515 Encounter for palliative care: Secondary | ICD-10-CM | POA: Diagnosis not present

## 2022-11-12 DIAGNOSIS — E039 Hypothyroidism, unspecified: Secondary | ICD-10-CM | POA: Diagnosis present

## 2022-11-12 DIAGNOSIS — D631 Anemia in chronic kidney disease: Secondary | ICD-10-CM | POA: Diagnosis present

## 2022-11-12 DIAGNOSIS — E785 Hyperlipidemia, unspecified: Secondary | ICD-10-CM | POA: Diagnosis present

## 2022-11-12 DIAGNOSIS — E876 Hypokalemia: Secondary | ICD-10-CM | POA: Diagnosis not present

## 2022-11-12 DIAGNOSIS — E871 Hypo-osmolality and hyponatremia: Secondary | ICD-10-CM | POA: Diagnosis present

## 2022-11-12 DIAGNOSIS — Z7982 Long term (current) use of aspirin: Secondary | ICD-10-CM

## 2022-11-12 DIAGNOSIS — Z90710 Acquired absence of both cervix and uterus: Secondary | ICD-10-CM

## 2022-11-12 DIAGNOSIS — Z882 Allergy status to sulfonamides status: Secondary | ICD-10-CM

## 2022-11-12 DIAGNOSIS — Z8673 Personal history of transient ischemic attack (TIA), and cerebral infarction without residual deficits: Secondary | ICD-10-CM

## 2022-11-12 DIAGNOSIS — E1165 Type 2 diabetes mellitus with hyperglycemia: Secondary | ICD-10-CM | POA: Diagnosis present

## 2022-11-12 DIAGNOSIS — Z7189 Other specified counseling: Secondary | ICD-10-CM | POA: Diagnosis not present

## 2022-11-12 DIAGNOSIS — E1169 Type 2 diabetes mellitus with other specified complication: Secondary | ICD-10-CM | POA: Diagnosis present

## 2022-11-12 DIAGNOSIS — Z79899 Other long term (current) drug therapy: Secondary | ICD-10-CM

## 2022-11-12 DIAGNOSIS — I051 Rheumatic mitral insufficiency: Secondary | ICD-10-CM | POA: Diagnosis present

## 2022-11-12 DIAGNOSIS — Z885 Allergy status to narcotic agent status: Secondary | ICD-10-CM

## 2022-11-12 DIAGNOSIS — E119 Type 2 diabetes mellitus without complications: Secondary | ICD-10-CM | POA: Diagnosis present

## 2022-11-12 DIAGNOSIS — I3139 Other pericardial effusion (noninflammatory): Secondary | ICD-10-CM | POA: Diagnosis present

## 2022-11-12 DIAGNOSIS — I5023 Acute on chronic systolic (congestive) heart failure: Secondary | ICD-10-CM | POA: Diagnosis present

## 2022-11-12 DIAGNOSIS — Z833 Family history of diabetes mellitus: Secondary | ICD-10-CM

## 2022-11-12 DIAGNOSIS — Z794 Long term (current) use of insulin: Secondary | ICD-10-CM | POA: Diagnosis not present

## 2022-11-12 DIAGNOSIS — Z0000 Encounter for general adult medical examination without abnormal findings: Secondary | ICD-10-CM

## 2022-11-12 DIAGNOSIS — I255 Ischemic cardiomyopathy: Secondary | ICD-10-CM | POA: Diagnosis present

## 2022-11-12 DIAGNOSIS — E1151 Type 2 diabetes mellitus with diabetic peripheral angiopathy without gangrene: Secondary | ICD-10-CM | POA: Diagnosis present

## 2022-11-12 DIAGNOSIS — R7989 Other specified abnormal findings of blood chemistry: Secondary | ICD-10-CM

## 2022-11-12 DIAGNOSIS — N184 Chronic kidney disease, stage 4 (severe): Secondary | ICD-10-CM | POA: Diagnosis present

## 2022-11-12 DIAGNOSIS — I130 Hypertensive heart and chronic kidney disease with heart failure and stage 1 through stage 4 chronic kidney disease, or unspecified chronic kidney disease: Secondary | ICD-10-CM | POA: Diagnosis present

## 2022-11-12 DIAGNOSIS — K7290 Hepatic failure, unspecified without coma: Secondary | ICD-10-CM | POA: Diagnosis present

## 2022-11-12 DIAGNOSIS — Z7984 Long term (current) use of oral hypoglycemic drugs: Secondary | ICD-10-CM

## 2022-11-12 DIAGNOSIS — Z1152 Encounter for screening for COVID-19: Secondary | ICD-10-CM | POA: Diagnosis not present

## 2022-11-12 DIAGNOSIS — R600 Localized edema: Secondary | ICD-10-CM

## 2022-11-12 DIAGNOSIS — Z8542 Personal history of malignant neoplasm of other parts of uterus: Secondary | ICD-10-CM

## 2022-11-12 DIAGNOSIS — I5043 Acute on chronic combined systolic (congestive) and diastolic (congestive) heart failure: Principal | ICD-10-CM

## 2022-11-12 DIAGNOSIS — I509 Heart failure, unspecified: Secondary | ICD-10-CM | POA: Diagnosis present

## 2022-11-12 LAB — BASIC METABOLIC PANEL
Anion gap: 10 (ref 5–15)
BUN: 28 mg/dL — ABNORMAL HIGH (ref 8–23)
CO2: 27 mmol/L (ref 22–32)
Calcium: 9.5 mg/dL (ref 8.9–10.3)
Chloride: 94 mmol/L — ABNORMAL LOW (ref 98–111)
Creatinine, Ser: 1.92 mg/dL — ABNORMAL HIGH (ref 0.44–1.00)
GFR, Estimated: 26 mL/min — ABNORMAL LOW (ref >60)
Glucose, Bld: 135 mg/dL — ABNORMAL HIGH (ref 70–99)
Potassium: 4.2 mmol/L (ref 3.5–5.1)
Sodium: 131 mmol/L — ABNORMAL LOW (ref 135–145)

## 2022-11-12 LAB — CBC
HCT: 28 % — ABNORMAL LOW (ref 36.0–46.0)
Hemoglobin: 9.2 g/dL — ABNORMAL LOW (ref 12.0–15.0)
MCH: 31.8 pg (ref 26.0–34.0)
MCHC: 32.9 g/dL (ref 30.0–36.0)
MCV: 96.9 fL (ref 80.0–100.0)
Platelets: 140 10*3/uL — ABNORMAL LOW (ref 150–400)
RBC: 2.89 MIL/uL — ABNORMAL LOW (ref 3.87–5.11)
RDW: 24 % — ABNORMAL HIGH (ref 11.5–15.5)
WBC: 8.8 10*3/uL (ref 4.0–10.5)
nRBC: 0 % (ref 0.0–0.2)

## 2022-11-12 LAB — GLUCOSE, CAPILLARY: Glucose-Capillary: 85 mg/dL (ref 70–99)

## 2022-11-12 LAB — SARS CORONAVIRUS 2 BY RT PCR: SARS Coronavirus 2 by RT PCR: NEGATIVE

## 2022-11-12 LAB — BRAIN NATRIURETIC PEPTIDE: B Natriuretic Peptide: 4449.1 pg/mL — ABNORMAL HIGH (ref 0.0–100.0)

## 2022-11-12 LAB — TROPONIN I (HIGH SENSITIVITY)
Troponin I (High Sensitivity): 68 ng/L — ABNORMAL HIGH (ref <18)
Troponin I (High Sensitivity): 70 ng/L — ABNORMAL HIGH (ref <18)

## 2022-11-12 MED ORDER — INSULIN GLARGINE-YFGN 100 UNIT/ML ~~LOC~~ SOLN
17.0000 [IU] | Freq: Every day | SUBCUTANEOUS | Status: DC
  Administered 2022-11-13 – 2022-11-14 (×2): 17 [IU] via SUBCUTANEOUS
  Filled 2022-11-12 (×4): qty 0.17

## 2022-11-12 MED ORDER — LEVOTHYROXINE SODIUM 88 MCG PO TABS
88.0000 ug | ORAL_TABLET | Freq: Every day | ORAL | Status: DC
  Administered 2022-11-13 – 2022-11-15 (×3): 88 ug via ORAL
  Filled 2022-11-12 (×3): qty 1

## 2022-11-12 MED ORDER — ONDANSETRON HCL 4 MG PO TABS: 4.0000 mg | ORAL_TABLET | Freq: Four times a day (QID) | ORAL | Status: DC | PRN

## 2022-11-12 MED ORDER — ONDANSETRON HCL 4 MG/2ML IJ SOLN: 4.0000 mg | Freq: Four times a day (QID) | INTRAMUSCULAR | Status: DC | PRN

## 2022-11-12 MED ORDER — PANTOPRAZOLE SODIUM 40 MG PO TBEC
40.0000 mg | DELAYED_RELEASE_TABLET | Freq: Two times a day (BID) | ORAL | Status: DC
  Administered 2022-11-12 – 2022-11-15 (×6): 40 mg via ORAL
  Filled 2022-11-12 (×6): qty 1

## 2022-11-12 MED ORDER — ALBUTEROL SULFATE HFA 108 (90 BASE) MCG/ACT IN AERS: 2.0000 | INHALATION_SPRAY | RESPIRATORY_TRACT | Status: DC | PRN

## 2022-11-12 MED ORDER — SENNOSIDES-DOCUSATE SODIUM 8.6-50 MG PO TABS: 1.0000 | ORAL_TABLET | Freq: Every evening | ORAL | Status: DC | PRN

## 2022-11-12 MED ORDER — ALBUTEROL SULFATE (2.5 MG/3ML) 0.083% IN NEBU: 2.5000 mg | INHALATION_SOLUTION | RESPIRATORY_TRACT | Status: DC | PRN

## 2022-11-12 MED ORDER — INSULIN ASPART 100 UNIT/ML IJ SOLN
0.0000 [IU] | Freq: Three times a day (TID) | INTRAMUSCULAR | Status: DC
  Administered 2022-11-14 (×2): 1 [IU] via SUBCUTANEOUS

## 2022-11-12 MED ORDER — INSULIN ASPART 100 UNIT/ML IJ SOLN
0.0000 [IU] | Freq: Every day | INTRAMUSCULAR | Status: DC
  Administered 2022-11-14: 2 [IU] via SUBCUTANEOUS

## 2022-11-12 MED ORDER — HEPARIN SODIUM (PORCINE) 5000 UNIT/ML IJ SOLN
5000.0000 [IU] | Freq: Three times a day (TID) | INTRAMUSCULAR | Status: DC
  Administered 2022-11-12 – 2022-11-15 (×8): 5000 [IU] via SUBCUTANEOUS
  Filled 2022-11-12 (×8): qty 1

## 2022-11-12 MED ORDER — SODIUM CHLORIDE 0.9% FLUSH
3.0000 mL | Freq: Two times a day (BID) | INTRAVENOUS | Status: DC
  Administered 2022-11-12 – 2022-11-14 (×4): 3 mL via INTRAVENOUS

## 2022-11-12 MED ORDER — ACETAMINOPHEN 650 MG RE SUPP: 650.0000 mg | Freq: Four times a day (QID) | RECTAL | Status: DC | PRN

## 2022-11-12 MED ORDER — FUROSEMIDE 10 MG/ML IJ SOLN
40.0000 mg | Freq: Two times a day (BID) | INTRAMUSCULAR | Status: DC
  Administered 2022-11-13 – 2022-11-14 (×3): 40 mg via INTRAVENOUS
  Filled 2022-11-12 (×3): qty 4

## 2022-11-12 MED ORDER — ASPIRIN 81 MG PO CHEW
81.0000 mg | CHEWABLE_TABLET | Freq: Every day | ORAL | Status: DC
  Administered 2022-11-13 – 2022-11-15 (×3): 81 mg via ORAL
  Filled 2022-11-12 (×3): qty 1

## 2022-11-12 MED ORDER — ACETAMINOPHEN 325 MG PO TABS: 650.0000 mg | ORAL_TABLET | Freq: Four times a day (QID) | ORAL | Status: DC | PRN

## 2022-11-12 MED ORDER — GUAIFENESIN 100 MG/5ML PO LIQD
5.0000 mL | ORAL | Status: DC | PRN
  Administered 2022-11-13 (×2): 5 mL via ORAL
  Filled 2022-11-12 (×2): qty 10

## 2022-11-12 MED ORDER — FUROSEMIDE 10 MG/ML IJ SOLN
40.0000 mg | Freq: Once | INTRAMUSCULAR | Status: AC
  Administered 2022-11-12: 40 mg via INTRAVENOUS
  Filled 2022-11-12: qty 4

## 2022-11-12 NOTE — ED Provider Notes (Signed)
[place] EMERGENCY DEPARTMENT AT [facility] Provider Note   CSN: [id number] Arrival date & time: 11/12/22  1533     History  Chief Complaint  Patient presents with   Congestive Heart Failure    Jessica Torres is a 82 y.o. female.  Pt with hx chf, ckd, c/o increased bilateral ankle swelling in the past couple days, w weight increase,  and increased non prod cough. Has long-standing orthopnea. On home o2 2 liters. Taking lasix as prescribed. Is making urine, but took extra lasix dose today, and did not necessarily urinate more than normal. No dysuria. No fever/chills. No known ill contacts. No chest pain. No abd pain or vomiting. Compliant w home meds.   The history is provided by the patient, a relative and medical records.  Congestive Heart Failure Pertinent negatives include no chest pain, no abdominal pain, no headaches and no shortness of breath.       Home Medications Prior to Admission medications   Medication Sig Start Date End Date Taking? Authorizing Provider  Accu-Chek Softclix Lancets lancets Test BS TID Dx E11.9 12/02/21   Junie Spencer, FNP  aspirin 81 MG chewable tablet Chew 1 tablet (81 mg total) by mouth daily. 07/18/22   Tyrone Nine, MD  BD PEN NEEDLE NANO 2ND GEN 32G X 4 MM MISC Use twice daily with insulin Dx E11.9 11/25/21   Jannifer Rodney A, FNP  empagliflozin (JARDIANCE) 25 MG TABS tablet Take 25 mg by mouth daily.    [provider]  ezetimibe (ZETIA) 10 MG tablet Take 1 tablet (10 mg total) by mouth daily. 08/22/21   Junie Spencer, FNP  fenofibrate 160 MG tablet TAKE 1 TABLET DAILY 04/02/22   Jannifer Rodney A, FNP  furosemide (LASIX) 20 MG tablet Take 2 tablets (40 mg total) by mouth daily. Take 2tabs in morning and take an extra dose as needed for weight gain of 3lb in 1 day or 5lb in 1 week 11/07/22   Zannie Cove, MD  glucose blood (FREESTYLE LITE) test strip Test BS TID Dx E11.9 11/25/21   Jannifer Rodney A, FNP  insulin glargine,  2 Unit Dial, (TOUJEO MAX SOLOSTAR) 300 UNIT/ML Solostar Pen Inject 26 Units into the skin at bedtime. 11/07/22 01/06/23  Zannie Cove, MD  insulin lispro (HUMALOG) 100 UNIT/ML KwikPen Inject 10-20 Units into the skin 3 (three) times daily. 08/22/21   Junie Spencer, FNP  levothyroxine (SYNTHROID) 88 MCG tablet TAKE 1 TABLET DAILY BEFORE BREAKFAST Patient taking differently: Take 88 mcg by mouth daily before breakfast. 04/24/22   Junie Spencer, FNP  ondansetron (ZOFRAN) 4 MG tablet Take 1 tablet (4 mg total) by mouth every 8 (eight) hours as needed for nausea or vomiting. 11/07/22   Zannie Cove, MD  pantoprazole (PROTONIX) 40 MG tablet Take 1 tablet (40 mg total) by mouth 2 (two) times daily. Follow up with gastroenterology for additional recommendations 07/17/22   Tyrone Nine, MD  metFORMIN (GLUCOPHAGE) 1000 MG tablet Take 1 tablet (1,000 mg total) by mouth 2 (two) times daily with a meal. Patient taking differently: Take 1,000 mg by mouth 2 (two) times daily with a meal. Only taking once a day 04/06/19 07/13/19  Remus Loffler, PA-C      Allergies    Morphine and codeine, Latex, and Statins    Review of Systems   Review of Systems  Constitutional:  Negative for fever.  HENT:  Negative for rhinorrhea and sore throat.  Eyes:  Negative for redness.  Respiratory:  Positive for cough. Negative for shortness of breath.   Cardiovascular:  Negative for chest pain.  Gastrointestinal:  Negative for abdominal pain and vomiting.  Genitourinary:  Negative for dysuria and flank pain.  Musculoskeletal:  Negative for back pain and neck pain.  Skin:  Negative for rash.  Neurological:  Negative for headaches.  Psychiatric/Behavioral:  Negative for confusion.     Physical Exam Updated Vital Signs BP (!) 104/52   Pulse 78   Temp 97.7 F (36.5 C) (Oral)   Resp 16   SpO2 96%  Physical Exam Vitals and nursing note reviewed.  Constitutional:      Appearance: Normal appearance. She is  well-developed.  HENT:     Head: Atraumatic.     Nose: Nose normal.     Mouth/Throat:     Mouth: Mucous membranes are moist.  Eyes:     General: No scleral icterus.    Conjunctiva/sclera: Conjunctivae normal.  Neck:     Trachea: No tracheal deviation.  Cardiovascular:     Rate and Rhythm: Normal rate and regular rhythm.     Pulses: Normal pulses.     Heart sounds: Normal heart sounds. No murmur heard.    No friction rub. No gallop.  Pulmonary:     Effort: Pulmonary effort is normal. No respiratory distress.     Breath sounds: Normal breath sounds.  Abdominal:     General: Bowel sounds are normal. There is no distension.     Palpations: Abdomen is soft.     Tenderness: There is no abdominal tenderness.  Genitourinary:    Comments: No cva tenderness.  Musculoskeletal:        General: No tenderness.     Cervical back: Normal range of motion and neck supple. No rigidity. No muscular tenderness.     Left lower leg: No edema.     Comments: Mild symmetric bilateral ankle edema.   Skin:    General: Skin is warm and dry.     Findings: No rash.  Neurological:     Mental Status: She is alert.     Comments: Alert, speech normal.   Psychiatric:        Mood and Affect: Mood normal.     ED Results / Procedures / Treatments   Labs (all labs ordered are listed, but only abnormal results are displayed) Results for orders placed or performed during the hospital encounter of 11/12/22  SARS Coronavirus 2 by RT PCR (hospital order, performed in [facility] hospital lab) *cepheid single result test* Anterior Nasal Swab   Specimen: Anterior Nasal Swab  Result Value Ref Range   SARS Coronavirus 2 by RT PCR NEGATIVE NEGATIVE  Basic metabolic panel  Result Value Ref Range   Sodium 131 (L) 135 - 145 mmol/L   Potassium 4.2 3.5 - 5.1 mmol/L   Chloride 94 (L) 98 - 111 mmol/L   CO2 27 22 - 32 mmol/L   Glucose, Bld 135 (H) 70 - 99 mg/dL   BUN 28 (H) 8 - 23 mg/dL   Creatinine, Ser 7.82 (H)  0.44 - 1.00 mg/dL   Calcium 9.5 8.9 - 95.6 mg/dL   GFR, Estimated 26 (L) >60 mL/min   Anion gap 10 5 - 15  CBC  Result Value Ref Range   WBC 8.8 4.0 - 10.5 K/uL   RBC 2.89 (L) 3.87 - 5.11 MIL/uL   Hemoglobin 9.2 (L) 12.0 - 15.0 g/dL   HCT 21.3 (  L) 36.0 - 46.0 %   MCV 96.9 80.0 - 100.0 fL   MCH 31.8 26.0 - 34.0 pg   MCHC 32.9 30.0 - 36.0 g/dL   RDW 16.1 (H) 09.6 - 04.5 %   Platelets 140 (L) 150 - 400 K/uL   nRBC 0.0 0.0 - 0.2 %  Brain natriuretic peptide  Result Value Ref Range   B Natriuretic Peptide 4,449.1 (H) 0.0 - 100.0 pg/mL  Troponin I (High Sensitivity)  Result Value Ref Range   Troponin I (High Sensitivity) 68 (H) <18 ng/L   DG Chest Portable 1 View  Result Date: 11/12/2022 CLINICAL DATA:  Shortness of breath EXAM: PORTABLE CHEST 1 VIEW COMPARISON:  11/03/2022 FINDINGS: Underinflated x-ray. Small pleural effusions. Interstitial changes with some vascular congestion. No pneumothorax. Overlapping cardiac leads. Calcified aorta. IMPRESSION: Underinflation with vascular congestion and interstitial changes. Small pleural effusions. Electronically Signed   By: Karen Kays M.D.   On: 11/12/2022 17:15   [place] CHEST (PLEURAL EFFUSION)  Result Date: 11/04/2022 CLINICAL DATA:  Pleural effusions EXAM: CHEST ULTRASOUND COMPARISON:  Chest x-ray 11/03/2022 FINDINGS: Bilateral pleural effusions are seen, moderate left and small right. IMPRESSION: Bilateral pleural effusions.  Moderate left and small right. Electronically Signed   By: Karen Kays M.D.   On: 11/04/2022 19:48   DG Chest Left Decubitus  Result Date: 11/03/2022 CLINICAL DATA:  Left pleural effusion. EXAM: CHEST - LEFT DECUBITUS COMPARISON:  November 01, 2022. FINDINGS: Small left pleural effusion noted on prior radiograph is not significantly changed suggesting possible loculation. Left basilar opacity is noted concerning for atelectasis or infiltrate. Probable small loculated right pleural effusion is noted with associated opacity.  IMPRESSION: See above. Electronically Signed   By: Lupita Raider M.D.   On: 11/03/2022 17:46   ECHOCARDIOGRAM LIMITED  Result Date: 11/01/2022    ECHOCARDIOGRAM LIMITED REPORT   Patient Name:   LATOYAH MCPETERS Date of Exam: 11/01/2022 Medical Rec #:  [id number]      Height:       65.0 in Accession #:    [id number]     Weight:       133.6 lb Date of Birth:  30-Jul-1940      BSA:          1.666 m Patient Age:    17 years       BP:           111/51 mmHg Patient Gender: F              HR:           79 bpm. Exam Location:  Inpatient Procedure: Limited Echo, Cardiac Doppler and Limited Color Doppler Indications:    CHF-Acute Systolic I50.21  History:        Patient has prior history of Echocardiogram examinations, most                 recent 07/14/2022. CAD and Previous Myocardial Infarction, PAD,                 Cirrhosis, Carotid Disease and Stroke, Signs/Symptoms:Murmur;                 Risk Factors:Dyslipidemia, Diabetes, Hypertension and                 Non-Smoker.  Sonographer:    Aron Baba Referring Phys: Little Ishikawa IMPRESSIONS  1. Limited study  2. Left ventricular ejection fraction, by estimation, is 30 to 35%. Left ventricular ejection fraction  by 2D MOD biplane is 32.2 %. The left ventricle has moderately decreased function. The left ventricle demonstrates regional wall motion abnormalities (see scoring diagram/findings for description). diastolic function is indeterminate due to lack of tissue doppler. There is severe hypokinesis of the left ventricular, mid-apical inferoseptal wall, septal wall, lateral wall and apical segment.  3. A small pericardial effusion is present.  4. Moderate to severe mitral valve regurgitation.  5. The aortic root is heaily calcified, but there does not appears to be significant aortic stenosis. Doppler velocities were not obtained.  6. There is mildly elevated pulmonary artery systolic pressure. The estimated right ventricular systolic pressure is 39.4 mmHg.  7. The  inferior vena cava is dilated in size with <50% respiratory variability, suggesting right atrial pressure of 15 mmHg. Comparison(s): Changes from prior study are noted. 07/14/2022: LVEF 40-45%, mid/distal lateral, mid/distal septal, distal inferior and apical walls. FINDINGS  Left Ventricle: Left ventricular ejection fraction, by estimation, is 30 to 35%. Left ventricular ejection fraction by 2D MOD biplane is 32.2 %. The left ventricle has moderately decreased function. The left ventricle demonstrates regional wall motion abnormalities. Severe hypokinesis of the left ventricular, mid-apical inferoseptal wall, septal wall, lateral wall and apical segment. The left ventricular internal cavity size was normal in size. There is no left ventricular hypertrophy. Diastolic function is indeterminate due to lack of tissue doppler. Right Ventricle: There is mildly elevated pulmonary artery systolic pressure. The tricuspid regurgitant velocity is 2.47 m/s, and with an assumed right atrial pressure of 15 mmHg, the estimated right ventricular systolic pressure is 39.4 mmHg. Pericardium: A small pericardial effusion is present. Mitral Valve: Moderate to severe mitral valve regurgitation, with centrally-directed jet. Tricuspid Valve: The tricuspid valve is grossly normal. Tricuspid valve regurgitation is mild. Aortic Valve: The aortic root is heaily calcified, but there does not appears to be significant aortic stenosis. Doppler velocities were not obtained. Venous: The inferior vena cava is dilated in size with less than 50% respiratory variability, suggesting right atrial pressure of 15 mmHg. Additional Comments: There is a small pleural effusion in the left lateral region. Spectral Doppler performed. Color Doppler performed.  LEFT VENTRICLE PLAX 2D                        Biplane EF (MOD) LVIDd:         4.70 cm         LV Biplane EF:   Left LVIDs:         3.90 cm                          ventricular LV PW:         0.80 cm                           ejection LV IVS:        0.60 cm                          fraction by                                                 2D MOD  biplane is LV Volumes (MOD)                                32.2 %. LV vol d, MOD    134.0 ml A2C: LV vol d, MOD    108.0 ml A4C: LV vol s, MOD    83.3 ml A2C: LV vol s, MOD    76.3 ml A4C: LV SV MOD A2C:   50.7 ml LV SV MOD A4C:   108.0 ml LV SV MOD BP:    38.7 ml RIGHT VENTRICLE TAPSE (M-mode): 1.6 cm LEFT ATRIUM         Index LA diam:    3.50 cm 2.10 cm/m  MITRAL VALVE                TRICUSPID VALVE MV Area (PHT): 4.31 cm     TR Peak grad:   24.4 mmHg MV Decel Time: 176 msec     TR Vmax:        247.00 cm/s MR Peak grad: 65.6 mmHg MR Vmax:      405.00 cm/s MV E velocity: 164.00 cm/s MV A velocity: 73.10 cm/s MV E/A ratio:  2.24 Zoila Shutter MD Electronically signed by Zoila Shutter MD Signature Date/Time: 11/01/2022/3:00:23 PM    Final    US THORACENTESIS ASP PLEURAL SPACE W/IMG GUIDE  Result Date: 11/01/2022 INDICATION: 82 year old female presents with shortness of breath, previous imaging showed bilateral pleural effusion right greater than left. Request for therapeutic and diagnostic thoracentesis. EXAM: ULTRASOUND GUIDED RIGHT THORACENTESIS MEDICATIONS: 5 mL 1% lidocaine COMPLICATIONS: None immediate. PROCEDURE: An ultrasound guided thoracentesis was thoroughly discussed with the patient and questions answered. The benefits, risks, alternatives and complications were also discussed. The patient understands and wishes to proceed with the procedure. Written consent was obtained. Ultrasound was performed to localize and mark an adequate pocket of fluid in the right chest. The area was then prepped and draped in the normal sterile fashion. 1% Lidocaine was used for local anesthesia. Under ultrasound guidance a 6 Fr Safe-T-Centesis catheter was introduced. Thoracentesis was performed. The catheter was removed and a dressing  applied. FINDINGS: A total of approximately 1.1 L of hazy yellow fluid was removed. Samples were sent to the laboratory as requested by the clinical team. Postprocedure chest x-ray ordered, results pending. IMPRESSION: Successful ultrasound guided right thoracentesis yielding 1.1 L of pleural fluid. Performed by: Lawernce Ion, PA-C Electronically Signed   By: Judie Petit.  Shick M.D.   On: 11/01/2022 12:57   DG Chest 1 View  Result Date: 11/01/2022 CLINICAL DATA:  Status post right thoracentesis. EXAM: CHEST  1 VIEW COMPARISON:  Two-view chest x-ray 10/29/2022 FINDINGS: The heart is enlarged. The right pleural effusion is markedly decreased. No significant pneumothorax is present. A left pleural effusion remains. Left basilar airspace opacity is noted. Mild pulmonary vascular congestion is present. The visualized soft tissues and bony thorax are unremarkable. IMPRESSION: 1. Marked decrease in right pleural effusion without significant pneumothorax. 2. Persistent left pleural effusion and basilar airspace disease. 3. Cardiomegaly and mild pulmonary vascular congestion. Electronically Signed   By: Marin Roberts M.D.   On: 11/01/2022 12:08   CT CHEST WO CONTRAST  Result Date: 10/31/2022 CLINICAL DATA:  Diffuse/interstitial lung disease EXAM: CT CHEST WITHOUT CONTRAST TECHNIQUE: Multidetector CT imaging of the chest was performed following the standard protocol without IV contrast. RADIATION DOSE REDUCTION: This exam was performed according to the departmental dose-optimization program  which includes automated exposure control, adjustment of the mA and/or kV according to patient size and/or use of iterative reconstruction technique. COMPARISON:  Chest radiograph 10/29/2022 FINDINGS: Cardiovascular: Aortic atherosclerosis without aneurysm. The heart is enlarged. There are coronary artery calcifications. No pericardial effusion. Decreased density of the blood pool suggests anemia. Mediastinum/Nodes: Shotty mediastinal  nodes including an 11 mm anterior paratracheal node. Hilar assessment is limited in the absence of IV contrast. Esophagus is grossly normal, partially obscured by adjacent pleural effusions. No thyroid nodule. Lungs/Pleura: Moderate to large right and moderate left pleural effusion. Assessment of lung parenchyma and interstitial lung disease is limited in the setting of pleural effusions and motion artifact. Subtotal atelectasis and opacification of both lower lobes, compressive atelectasis within the dependent right upper lobe. Additional ground-glass opacities in both lower lobes in the periphery of the right upper lobe. No bronchiectasis. Trachea and central airways are clear. Upper Abdomen: Nodular hepatic contour suggestive of cirrhosis. No acute upper abdominal findings. Musculoskeletal: There are no acute or suspicious osseous abnormalities. No chest wall soft tissue abnormalities. IMPRESSION: 1. Moderate to large right and moderate left pleural effusion. Subtotal atelectasis and opacification of both lower lobes, compressive atelectasis within the dependent right upper lobe. Additional ground-glass opacities in both lower lobes in the periphery of the right upper lobe. Findings may be combination of compressive atelectasis and pulmonary edema or infection. 2. Cardiomegaly with coronary artery calcifications. 3. Nodular hepatic contour suggestive of cirrhosis. 4. Shotty mediastinal nodes are likely reactive. Aortic Atherosclerosis (ICD10-I70.0). Electronically Signed   By: Narda Rutherford M.D.   On: 10/31/2022 16:14   DG Chest 2 View  Result Date: 10/29/2022 CLINICAL DATA:  Cough and shortness of breath EXAM: CHEST - 2 VIEW COMPARISON:  X-ray 04/27/2022 FINDINGS: Underinflation. Bilateral pleural effusions and adjacent opacities. No pneumothorax. Vascular congestion. Mild interstitial prominence. Component of edema is not excluded. Recommend follow-up. Overlapping cardiac leads IMPRESSION: Developing  bilateral pleural effusions with the adjacent opacities and interstitial changes. Recommend follow-up Electronically Signed   By: Karen Kays M.D.   On: 10/29/2022 13:56     EKG EKG Interpretation Date/Time:  Wednesday November 12 2022 15:46:14 EDT Ventricular Rate:  81 PR Interval:  187 QRS Duration:  84 QT Interval:  382 QTC Calculation: 444 R Axis:   104  Text Interpretation: Sinus rhythm Atrial premature complex Right axis deviation Non-specific ST-t changes No significant change since last tracing Confirmed by Cathren Laine ([id number]) on 11/12/2022 3:51:16 PM  Radiology DG Chest Portable 1 View  Result Date: 11/12/2022 CLINICAL DATA:  Shortness of breath EXAM: PORTABLE CHEST 1 VIEW COMPARISON:  11/03/2022 FINDINGS: Underinflated x-ray. Small pleural effusions. Interstitial changes with some vascular congestion. No pneumothorax. Overlapping cardiac leads. Calcified aorta. IMPRESSION: Underinflation with vascular congestion and interstitial changes. Small pleural effusions. Electronically Signed   By: Karen Kays M.D.   On: 11/12/2022 17:15    Procedures Procedures    Medications Ordered in ED Medications  albuterol (VENTOLIN HFA) 108 (90 Base) MCG/ACT inhaler 2 puff (has no administration in time range)  furosemide (LASIX) injection 40 mg (has no administration in time range)    ED Course/ Medical Decision Making/ A&P                                 Medical Decision Making Problems Addressed: Acute on chronic combined systolic and diastolic CHF (congestive heart failure) (HCC): acute illness or injury with systemic symptoms  that poses a threat to life or bodily functions Bilateral leg edema: acute illness or injury with systemic symptoms Elevated brain natriuretic peptide (BNP) level: acute illness or injury 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 Labs: ordered. Decision-making details documented in ED Course. Radiology: ordered and independent interpretation performed. Decision-making details documented in ED Course. ECG/medicine tests: ordered and independent interpretation performed. Decision-making details documented in ED Course. Discussion of management or test interpretation with external provider(s): Medicine/hospitalists  Risk Prescription drug management. Decision regarding hospitalization.   Iv ns. Continuous pulse ox and cardiac monitoring. Labs ordered/sent. Imaging ordered.   Differential diagnosis includes chf exacerbation, ckd/aki, etc. Dispo decision including potential need for admission considered - will get labs and imaging and reassess.   Reviewed nursing notes and prior charts for additional history. External reports reviewed. Additional history from: family.   Cardiac monitor: sinus rhythm, rate 80.  Labs reviewed/interpreted by me - bnp very high. Vascular congestion on cxr. Lasix iv. Trop sl elev (much lower than prior).   Xrays reviewed/interpreted by me - vasc congestion/chf.   Hospitalists consulted for admission.           Final Clinical Impression(s) / ED Diagnoses Final diagnoses:  None    Rx / DC Orders ED Discharge Orders     None         Cathren Laine, MD 11/12/22 1742

## 2022-11-12 NOTE — ED Notes (Signed)
[facility] at bedside to assume care.

## 2022-11-12 NOTE — ED Notes (Signed)
Attempt to call report x 1  

## 2022-11-12 NOTE — ED Triage Notes (Signed)
Reports worsening cough and swelling in legs over past several days. Fluids have not been monitored recently where she has been living.  80mg  lasix today.

## 2022-11-12 NOTE — ED Notes (Signed)
ED TO INPATIENT HANDOFF REPORT  ED Nurse Name and Phone #: Ruairi Stutsman, MSN, RN, PCCN  S Name/Age/Gender Jessica Torres 82 y.o. female Room/Bed: DB002/DB002  Code Status   Code Status: Prior  Home/SNF/Other Home Patient oriented to: self, place, time, and situation Is this baseline? Yes   Triage Complete: Triage complete  Chief Complaint CHF (congestive heart failure) (HCC) [I50.9]  Triage Note Reports worsening cough and swelling in legs over past several days. Fluids have not been monitored recently where she has been living.  80mg  lasix today.   Allergies Allergies  Allergen Reactions   Morphine And Codeine Swelling and Other (See Comments)    Facial swelling   Latex Rash   Statins Other (See Comments)    Myalgias     Level of Care/Admitting Diagnosis ED Disposition     ED Disposition  Admit   Condition  --   Comment  Hospital Area: [facility] [[id number]]  Level of Care: Telemetry Cardiac [[id number]]  May admit patient to [facility] or [facility] if equivalent level of care is available:: Yes  Interfacility transfer: Yes  Covid Evaluation: Asymptomatic - no recent exposure (last 10 days) testing not required  Diagnosis: CHF (congestive heart failure) [facility]) [[id number]]  Admitting Physician: Buena Irish [[id number]]  Attending Physician: Buena Irish [[id number]]  Certification:: I certify this patient will need inpatient services for at least 2 midnights  Expected Medical Readiness: 11/15/2022          B Medical/Surgery History Past Medical History:  Diagnosis Date   Arthritis    "fingers probably" (10/30/2016)   Bulging lumbar disc    Chronic lower back pain    Difficulty urinating    GERD (gastroesophageal reflux disease)    Heart murmur    hx   Hyperlipidemia    statin intolerant   Hypertension    Hypothyroidism    PAD (peripheral artery disease) (HCC)    Sleep apnea    "dx'd years ago; had severe problems; all of the  sudden it just went away" (07/06/2014) *still gone" (10/30/2016)   Stroke [facility]) ~ 2010   denies residual on 10/30/2016   Type 2 diabetes mellitus (HCC)    Urgency of urination    Uterine cancer [facility])    Past Surgical History:  Procedure Laterality Date   ANGIOPLASTY Right 01/22/2015   w/ atherectomy R sfa   BALLOON ANGIOPLASTY, ARTERY Right    SFA/notes 07/06/2014   BIOPSY  04/28/2022   Procedure: BIOPSY;  Surgeon: Tressia Danas, MD;  Location: [facility] ENDOSCOPY;  Service: Gastroenterology;;   BREAST EXCISIONAL BIOPSY Left    BREAST SURGERY     CARPAL TUNNEL RELEASE Right 1980's   COLONOSCOPY WITH PROPOFOL N/A 11/26/2020   Procedure: COLONOSCOPY WITH PROPOFOL;  Surgeon: Lanelle Bal, DO;  Location[facility] ENDO SU[facility];  Service: Endoscopy;  Laterality: N/A;  9:00am, pt cannot come in earlier   DILATION AND CURETTAGE OF UTERUS     ESOPHAGOGASTRODUODENOSCOPY N/A 04/28/2022   Procedure: ESOPHAGOGASTRODUODENOSCOPY (EGD);  Surgeon: Tressia Danas, MD;  Location: [facility] ENDOSCOPY;  Service: Gastroenterology;  Laterality: N/A;   ESOPHAGOGASTRODUODENOSCOPY (EGD) WITH PROPOFOL N/A 04/30/2022   Procedure: ESOPHAGOGASTRODUODENOSCOPY (EGD) WITH PROPOFOL;  Surgeon: Tressia Danas, MD;  Location: [facility] ENDOSCOPY;  Service: Gastroenterology;  Laterality: N/A;  With [facility]   ESOPHAGOGASTRODUODENOSCOPY (EGD) WITH PROPOFOL N/A 07/15/2022   Procedure: ESOPHAGOGASTRODUODENOSCOPY (EGD) WITH PROPOFOL;  Surgeon: Dolores Frame, MD;  [facility] ENDO [facility];  Service: Gastroenterology;  Laterality: N/A;  HEMORRHOID SURGERY     "dr lanced it; in dr's office"   HOT HEMOSTASIS N/A 04/30/2022   Procedure: HOT HEMOSTASIS (ARGON PLASMA COAGULATION/BICAP);  Surgeon: Tressia Danas, MD;  Location: [facility] ENDOSCOPY;  Service: Gastroenterology;  Laterality: N/A;   HOT HEMOSTASIS  07/15/2022   Procedure: HOT HEMOSTASIS (ARGON PLASMA COAGULATION/BICAP);  Surgeon: Marguerita Merles, Reuel Boom, MD;  Location[facility]O [facility];   Service: Gastroenterology;;   INCISION AND DRAINAGE BREAST ABSCESS Left    LOWER EXTREMITY ANGIOGRAM N/A 07/06/2014   Procedure: LOWER EXTREMITY ANGIOGRAM & R-SFA atherectomy and balloon angioplasty;  Surgeon: Runell Gess, MD;  Location: [facility] CATH LAB;  Service: Cardiovascular;  Laterality: R   LOWER EXTREMITY ANGIOGRAM  10/30/2016   LOWER EXTREMITY ANGIOGRAPHY N/A 10/30/2016   Procedure: Lower Extremity Angiography;  Surgeon: Runell Gess, MD;  Location: [facility] INVASIVE CV LAB;  Service: Cardiovascular;  Laterality: N/A;   PERIPHERAL VASCULAR CATHETERIZATION N/A 08/17/2014   Procedure: Lower Extremity Angiography;  Surgeon: Runell Gess, MD;  Location: [facility] INVASIVE CV LAB;  Service: Cardiovascular;  Laterality: N/A;   PERIPHERAL VASCULAR CATHETERIZATION Left 08/17/2014   Procedure: Peripheral Vascular Atherectomy and Viabahn stent L-SFA;  Surgeon: Runell Gess, MD; Laterality: Left; L-SFA   PERIPHERAL VASCULAR CATHETERIZATION N/A 01/11/2015   Procedure: Lower Extremity Angiography;  Surgeon: Runell Gess, MD;  Location: [facility] INVASIVE CV LAB;  Service: Cardiovascular;  Laterality: N/A;   PERIPHERAL VASCULAR CATHETERIZATION Right 01/22/2015   Procedure: Peripheral Vascular Atherectomy;  Surgeon: Runell Gess, MD;  Location: [facility] INVASIVE CV LAB;  Service: Cardiovascular;  Laterality: Right;   POLYPECTOMY  11/26/2020   Procedure: POLYPECTOMY;  Surgeon: Lanelle Bal, DO;  [facility] ENDO [facility];  Service: Endoscopy;;   POLYPECTOMY  07/15/2022   Procedure: POLYPECTOMY INTESTINAL;  Surgeon: Dolores Frame, MD;  Location[facility] ENDO [facility];  Service: Gastroenterology;;   TUBAL LIGATION     VAGINAL HYSTERECTOMY       A IV Location/Drains/Wounds Patient Lines/Drains/Airways Status     Active Line/Drains/Airways     Name Placement date Placement time Site Days   Peripheral IV 11/12/22 20 G Anterior;Right Forearm 11/12/22  1556  Forearm  less than 1             Intake/Output Last 24 hours No intake or output data in the 24 hours ending 11/12/22 1935  Labs/Imaging Results for orders placed or performed during the hospital encounter of 11/12/22 (from the past 48 hour(s))  Brain natriuretic peptide     Status: Abnormal   Collection Time: 11/12/22  3:49 PM  Result Value Ref Range   B Natriuretic Peptide 4,449.1 (H) 0.0 - 100.0 pg/mL    Comment: Performed at [facility], [address], [place], [place] [address]  Basic metabolic panel     Status: Abnormal   Collection Time: 11/12/22  3:56 PM  Result Value Ref Range   Sodium 131 (L) 135 - 145 mmol/L   Potassium 4.2 3.5 - 5.1 mmol/L   Chloride 94 (L) 98 - 111 mmol/L   CO2 27 22 - 32 mmol/L   Glucose, Bld 135 (H) 70 - 99 mg/dL    Comment: Glucose reference range applies only to samples taken after fasting for at least 8 hours.   BUN 28 (H) 8 - 23 mg/dL   Creatinine, Ser 6.04 (H) 0.44 - 1.00 mg/dL   Calcium 9.5 8.9 - 54.0 mg/dL   GFR, Estimated 26 (L) >60 mL/min    Comment: (NOTE) Calculated  using the CKD-EPI Creatinine Equation (2021)    Anion gap 10 5 - 15    Comment: Performed at [facility], [address], [place], [place] [address]  CBC     Status: Abnormal   Collection Time: 11/12/22  3:56 PM  Result Value Ref Range   WBC 8.8 4.0 - 10.5 K/uL   RBC 2.89 (L) 3.87 - 5.11 MIL/uL   Hemoglobin 9.2 (L) 12.0 - 15.0 g/dL   HCT 60.4 (L) 54.0 - 98.1 %   MCV 96.9 80.0 - 100.0 fL   MCH 31.8 26.0 - 34.0 pg   MCHC 32.9 30.0 - 36.0 g/dL   RDW 19.1 (H) 47.8 - 29.5 %   Platelets 140 (L) 150 - 400 K/uL   nRBC 0.0 0.0 - 0.2 %    Comment: Performed at [facility], [address], [place], [place] [address]  Troponin I (High Sensitivity)     Status: Abnormal   Collection Time: 11/12/22  3:56 PM  Result Value Ref Range   Troponin I (High Sensitivity) 68 (H) <18 ng/L    Comment: (NOTE) Elevated high sensitivity troponin I (hsTnI)  values and significant  changes across serial measurements may suggest ACS but many other  chronic and acute conditions are known to elevate hsTnI results.  Refer to the "Links" section for chest pain algorithms and additional  guidance. Performed at [facility], [address] [place], [place] [address]   SARS Coronavirus 2 by RT PCR (hospital order, performed in [facility] hospital lab) *cepheid single result test* Anterior Nasal Swab     Status: None   Collection Time: 11/12/22  4:55 PM   Specimen: Anterior Nasal Swab  Result Value Ref Range   SARS Coronavirus 2 by RT PCR NEGATIVE NEGATIVE    Comment: (NOTE) SARS-CoV-2 target nucleic acids are NOT DETECTED.  The SARS-CoV-2 RNA is generally detectable in upper and lower respiratory specimens during the acute phase of infection. The lowest concentration of SARS-CoV-2 viral copies this assay can detect is 250 copies / mL. A negative result does not preclude SARS-CoV-2 infection and should not be used as the sole basis for treatment or other patient management decisions.  A negative result may occur with improper specimen collection / handling, submission of specimen other than nasopharyngeal swab, presence of viral mutation(s) within the areas targeted by this assay, and inadequate number of viral copies (<250 copies / mL). A negative result must be combined with clinical observations, patient history, and epidemiological information.  Fact Sheet for Patients:   RoadLapTop.co.za  Fact Sheet for Healthcare Providers: http://kim-miller.com/  This test is not yet approved or  cleared by the [place] FDA and has been authorized for detection and/or diagnosis of SARS-CoV-2 by FDA under an Emergency Use Authorization (EUA).  This EUA will remain in effect (meaning this test can be used) for the duration of the COVID-19 declaration under Section 564(b)(1) of the  Act, 21 U.S.C. section 360bbb-3(b)(1), unless the authorization is terminated or revoked sooner.  Performed at [facility], [address] [place], [place] [address]   Troponin I (High Sensitivity)     Status: Abnormal   Collection Time: 11/12/22  6:00 PM  Result Value Ref Range   Troponin I (High Sensitivity) 70 (H) <18 ng/L    Comment: (NOTE) Elevated high sensitivity troponin I (hsTnI) values and significant  changes across serial measurements may suggest ACS but many other  chronic and acute conditions are known  to elevate hsTnI results.  Refer to the "Links" section for chest pain algorithms and additional  guidance. Performed at [facility], [address] [place], [place] [address]    DG Chest Portable 1 View  Result Date: 11/12/2022 CLINICAL DATA:  Shortness of breath EXAM: PORTABLE CHEST 1 VIEW COMPARISON:  11/03/2022 FINDINGS: Underinflated x-ray. Small pleural effusions. Interstitial changes with some vascular congestion. No pneumothorax. Overlapping cardiac leads. Calcified aorta. IMPRESSION: Underinflation with vascular congestion and interstitial changes. Small pleural effusions. Electronically Signed   By: Karen Kays M.D.   On: 11/12/2022 17:15    Pending Labs Unresulted Labs (From admission, onward)    None       Vitals/Pain Today's Vitals   11/12/22 1830 11/12/22 1845 11/12/22 1900 11/12/22 1915  BP: 110/66 (!) 104/49 (!) 109/51 (!) 111/52  Pulse: 84 83 84 85  Resp: 19 16 17 15   Temp:      TempSrc:      SpO2: 93% 94% 94% 94%  PainSc:        Isolation Precautions No active isolations  Medications Medications  albuterol (VENTOLIN HFA) 108 (90 Base) MCG/ACT inhaler 2 puff (has no administration in time range)  furosemide (LASIX) injection 40 mg (40 mg Intravenous Given 11/12/22 1800)    Mobility      Focused Assessments Pulmonary Assessment Handoff:  Lung sounds: L Breath Sounds: Coarse crackles R  Breath Sounds: Diminished O2 Device: Nasal Cannula O2 Flow Rate (L/min): 2 L/min    R Recommendations: See Admitting Provider Note  Report given to: Molli Hazard Charge RN  Additional Notes:

## 2022-11-12 NOTE — Telephone Encounter (Signed)
Call to [facility] and she states that the family took her to the ED for evaluation.   Did advise the call center in future those type of calls need to be addressed with Triage as Urgent.

## 2022-11-12 NOTE — Progress Notes (Signed)
  Progress Note   [name] ZOX:[id number] DOB: 05/13/1940 DOA: 11/12/2022       82 yo w h/o EF=35%, severe MR, baseline Cr=1.9, and 2LO2 [place] presents w CHF exacerbation.      Author: Buena Irish, MD 11/12/2022 6:27 PM  For on call review [email or web address].

## 2022-11-12 NOTE — H&P (Signed)
History and Physical    Jessica Torres HQI:[id number] DOB: [birth year] DOA: 11/12/2022  PCP: Rebekah Chesterfield, NP   Patient coming from: Home   Chief Complaint: Weight gain, increased leg swelling   HPI: Jessica Torres is a pleasant 82 y.o. female with medical history significant for insulin-dependent diabetes mellitus, hypothyroidism, history of CVA, CAD, CKD 4, chronic hypoxic respiratory failure, and chronic systolic CHF who presents with weight gain and increased bilateral lower extremity swelling.  Patient reports that she has had increased shortness of breath the last couple days, as well as worsening nonproductive cough and difficulty sleeping.  She denies chest pain, fever, chills, or sputum production.  Home health RN noted that her weight had increased and that she also had increased bilateral ankle swelling.  Patient took an extra dose of Lasix today but did not notice any increase in urine output.  She was discharged from the hospital on 11/07/2022 after admission for acute CHF.  Jardiance and Coreg were discontinued during the admission due to worsening renal function and low blood pressure.  She was also discharged with home oxygen.  [facility] ED Course: Upon arrival to the ED, patient is found to be afebrile and saturating mid 90s on 2 L/min of supplemental oxygen with normal heart rate and systolic blood pressure of 100.  EKG demonstrates sinus rhythm with PAC and RAD.  Chest x-ray notable for vascular congestion and small pleural effusions.  Labs are most notable for BNP 4449, troponin 68, creatinine 1.92, sodium 131, and hemoglobin 9.2.  Patient was given 40 mg IV Lasix and transferred to [facility] for admission.  Review of Systems:  All other systems reviewed and apart from HPI, are negative.  Past Medical History:  Diagnosis Date   Arthritis    "fingers probably" (10/30/2016)   Bulging lumbar disc    Chronic lower back pain    Difficulty urinating     GERD (gastroesophageal reflux disease)    Heart murmur    hx   Hyperlipidemia    statin intolerant   Hypertension    Hypothyroidism    PAD (peripheral artery disease) (HCC)    Sleep apnea    "dx'd years ago; had severe problems; all of the sudden it just went away" (07/06/2014) *still gone" (10/30/2016)   Stroke [facility]) ~ 2010   denies residual on 10/30/2016   Type 2 diabetes mellitus (HCC)    Urgency of urination    Uterine cancer [facility])     Past Surgical History:  Procedure Laterality Date   ANGIOPLASTY Right 01/22/2015   w/ atherectomy R sfa   BALLOON ANGIOPLASTY, ARTERY Right    SFA/notes 07/06/2014   BIOPSY  04/28/2022   Procedure: BIOPSY;  Surgeon: Tressia Danas, MD;  Location: [facility] ENDOSCOPY;  Service: Gastroenterology;;   BREAST EXCISIONAL BIOPSY Left    BREAST SURGERY     CARPAL TUNNEL RELEASE Right 1980's   COLONOSCOPY WITH PROPOFOL N/A 11/26/2020   Procedure: COLONOSCOPY WITH PROPOFOL;  Surgeon: Lanelle Bal, DO;  [facility] ENDO [facility];  Service: Endoscopy;  Laterality: N/A;  9:00am, pt cannot come in earlier   DILATION AND CURETTAGE OF UTERUS     ESOPHAGOGASTRODUODENOSCOPY N/A 04/28/2022   Procedure: ESOPHAGOGASTRODUODENOSCOPY (EGD);  Surgeon: Tressia Danas, MD;  Location: [facility] ENDOSCOPY;  Service: Gastroenterology;  Laterality: N/A;   ESOPHAGOGASTRODUODENOSCOPY (EGD) WITH PROPOFOL N/A 04/30/2022   Procedure: ESOPHAGOGASTRODUODENOSCOPY (EGD) WITH PROPOFOL;  Surgeon: Tressia Danas, MD;  Location: [facility] ENDOSCOPY;  Service: Gastroenterology;  Laterality:  N/A;  With APC   ESOPHAGOGASTRODUODENOSCOPY (EGD) WITH PROPOFOL N/A 07/15/2022   Procedure: ESOPHAGOGASTRODUODENOSCOPY (EGD) WITH PROPOFOL;  Surgeon: Dolores Frame, MD;  [facility]O [facility];  Service: Gastroenterology;  Laterality: N/A;   HEMORRHOID SURGERY     "dr lanced it; in dr's office"   HOT HEMOSTASIS N/A 04/30/2022   Procedure: HOT HEMOSTASIS (ARGON PLASMA COAGULATION/BICAP);  Surgeon:  Tressia Danas, MD;  Location: [facility] ENDOSCOPY;  Service: Gastroenterology;  Laterality: N/A;   HOT HEMOSTASIS  07/15/2022   Procedure: HOT HEMOSTASIS (ARGON PLASMA COAGULATION/BICAP);  Surgeon: Marguerita Merles, Reuel Boom, MD;  Location[facility]O [facility];  Service: Gastroenterology;;   INCISION AND DRAINAGE BREAST ABSCESS Left    LOWER EXTREMITY ANGIOGRAM N/A 07/06/2014   Procedure: LOWER EXTREMITY ANGIOGRAM & R-SFA atherectomy and balloon angioplasty;  Surgeon: Runell Gess, MD;  Location: [facility] CATH LAB;  Service: Cardiovascular;  Laterality: R   LOWER EXTREMITY ANGIOGRAM  10/30/2016   LOWER EXTREMITY ANGIOGRAPHY N/A 10/30/2016   Procedure: Lower Extremity Angiography;  Surgeon: Runell Gess, MD;  Location: [facility] INVASIVE CV LAB;  Service: Cardiovascular;  Laterality: N/A;   PERIPHERAL VASCULAR CATHETERIZATION N/A 08/17/2014   Procedure: Lower Extremity Angiography;  Surgeon: Runell Gess, MD;  Location: [facility] INVASIVE CV LAB;  Service: Cardiovascular;  Laterality: N/A;   PERIPHERAL VASCULAR CATHETERIZATION Left 08/17/2014   Procedure: Peripheral Vascular Atherectomy and Viabahn stent L-SFA;  Surgeon: Runell Gess, MD; Laterality: Left; L-SFA   PERIPHERAL VASCULAR CATHETERIZATION N/A 01/11/2015   Procedure: Lower Extremity Angiography;  Surgeon: Runell Gess, MD;  Location: [facility] INVASIVE CV LAB;  Service: Cardiovascular;  Laterality: N/A;   PERIPHERAL VASCULAR CATHETERIZATION Right 01/22/2015   Procedure: Peripheral Vascular Atherectomy;  Surgeon: Runell Gess, MD;  Location: [facility] INVASIVE CV LAB;  Service: Cardiovascular;  Laterality: Right;   POLYPECTOMY  11/26/2020   Procedure: POLYPECTOMY;  Surgeon: Lanelle Bal, DO;  [facility]O [facility];  Service: Endoscopy;;   POLYPECTOMY  07/15/2022   Procedure: POLYPECTOMY INTESTINAL;  Surgeon: Dolores Frame, MD;  [facility] ENDO [facility];  Service: Gastroenterology;;   TUBAL LIGATION     VAGINAL HYSTERECTOMY      Social History:    reports that she has never smoked. She has never used smokeless tobacco. She reports that she does not currently use alcohol. She reports that she does not use drugs.  Allergies  Allergen Reactions   Morphine And Codeine Swelling and Other (See Comments)    Facial swelling   Latex Rash   Statins Other (See Comments)    Myalgias     Family History  Problem Relation Age of Onset   Heart attack Mother    Heart disease Mother    Arrhythmia Mother        has PPM   Heart disease Father    Diabetes Sister    Breast cancer Neg Hx      Prior to Admission medications   Medication Sig Start Date End Date Taking? Authorizing Provider  Accu-Chek Softclix Lancets lancets Test BS TID Dx E11.9 12/02/21   Junie Spencer, FNP  aspirin 81 MG chewable tablet Chew 1 tablet (81 mg total) by mouth daily. 07/18/22   Tyrone Nine, MD  BD PEN NEEDLE NANO 2ND GEN 32G X 4 MM MISC Use twice daily with insulin Dx E11.9 11/25/21   Jannifer Rodney A, FNP  empagliflozin (JARDIANCE) 25 MG TABS tablet Take 25 mg by mouth daily.    [provider]  ezetimibe (  ZETIA) 10 MG tablet Take 1 tablet (10 mg total) by mouth daily. 08/22/21   Junie Spencer, FNP  fenofibrate 160 MG tablet TAKE 1 TABLET DAILY 04/02/22   Jannifer Rodney A, FNP  furosemide (LASIX) 20 MG tablet Take 2 tablets (40 mg total) by mouth daily. Take 2tabs in morning and take an extra dose as needed for weight gain of 3lb in 1 day or 5lb in 1 week 11/07/22   Zannie Cove, MD  glucose blood (FREESTYLE LITE) test strip Test BS TID Dx E11.9 11/25/21   Jannifer Rodney A, FNP  insulin glargine, 2 Unit Dial, (TOUJEO MAX SOLOSTAR) 300 UNIT/ML Solostar Pen Inject 26 Units into the skin at bedtime. 11/07/22 01/06/23  Zannie Cove, MD  insulin lispro (HUMALOG) 100 UNIT/ML KwikPen Inject 10-20 Units into the skin 3 (three) times daily. 08/22/21   Junie Spencer, FNP  levothyroxine (SYNTHROID) 88 MCG tablet TAKE 1 TABLET DAILY BEFORE BREAKFAST Patient  taking differently: Take 88 mcg by mouth daily before breakfast. 04/24/22   Junie Spencer, FNP  ondansetron (ZOFRAN) 4 MG tablet Take 1 tablet (4 mg total) by mouth every 8 (eight) hours as needed for nausea or vomiting. 11/07/22   Zannie Cove, MD  pantoprazole (PROTONIX) 40 MG tablet Take 1 tablet (40 mg total) by mouth 2 (two) times daily. Follow up with gastroenterology for additional recommendations 07/17/22   Tyrone Nine, MD  metFORMIN (GLUCOPHAGE) 1000 MG tablet Take 1 tablet (1,000 mg total) by mouth 2 (two) times daily with a meal. Patient taking differently: Take 1,000 mg by mouth 2 (two) times daily with a meal. Only taking once a day 04/06/19 07/13/19  Remus Loffler, PA-C    Physical Exam: Vitals:   11/12/22 1845 11/12/22 1900 11/12/22 1915 11/12/22 2020  BP: (!) 104/49 (!) 109/51 (!) 111/52 (!) 114/55  Pulse: 83 84 85 88  Resp: 16 17 15 15   Temp:    98.5 F (36.9 C)  TempSrc:    Oral  SpO2: 94% 94% 94% 95%    Constitutional: NAD, calm  Eyes: PERTLA, lids and conjunctivae normal ENMT: Mucous membranes are moist. Posterior pharynx clear of any exudate or lesions.   Neck: supple, no masses  Respiratory: no wheezing. No accessory muscle use.  Cardiovascular: S1 & S2 heard, regular rate and rhythm. Pretibial pitting edema bilaterally.   Abdomen: No distension, no tenderness, soft. Bowel sounds active.  Musculoskeletal: no clubbing / cyanosis. No joint deformity upper and lower extremities.   Skin: no significant rashes, lesions, ulcers. Warm, dry, well-perfused. Neurologic: CN 2-12 grossly intact. Moving all extremities. Alert and oriented.  Psychiatric: Pleasant. Cooperative.    Labs and Imaging on Admission: I have personally reviewed following labs and imaging studies  CBC: Recent Labs  Lab 11/07/22 0637 11/12/22 1556  WBC 6.3 8.8  HGB 8.9* 9.2*  HCT 27.7* 28.0*  MCV 92.6 96.9  PLT 237 140*   Basic Metabolic Panel: Recent Labs  Lab 11/06/22 0327  11/07/22 0637 11/12/22 1556  NA 132* 133* 131*  K 3.5 3.5 4.2  CL 92* 93* 94*  CO2 30 31 27   GLUCOSE 192* 193* 135*  BUN 27* 26* 28*  CREATININE 1.70* 1.63* 1.92*  CALCIUM 9.3 9.3 9.5   GFR: Estimated Creatinine Clearance: 21.4 mL/min (A) (by C-G formula based on SCr of 1.92 mg/dL (H)). Liver Function Tests: No results for input(s): "AST", "ALT", "ALKPHOS", "BILITOT", "PROT", "ALBUMIN" in the last 168 hours. No results for input(s): "LIPASE", "  AMYLASE" in the last 168 hours. No results for input(s): "AMMONIA" in the last 168 hours. Coagulation Profile: No results for input(s): "INR", "PROTIME" in the last 168 hours. Cardiac Enzymes: No results for input(s): "CKTOTAL", "CKMB", "CKMBINDEX", "TROPONINI" in the last 168 hours. BNP (last 3 results) No results for input(s): "PROBNP" in the last 8760 hours. HbA1C: No results for input(s): "HGBA1C" in the last 72 hours. CBG: Recent Labs  Lab 11/06/22 1116 11/06/22 1604 11/06/22 2125 11/07/22 0623 11/07/22 1228  GLUCAP 237* 187* 263* 193* 261*   Lipid Profile: No results for input(s): "CHOL", "HDL", "LDLCALC", "TRIG", "CHOLHDL", "LDLDIRECT" in the last 72 hours. Thyroid Function Tests: No results for input(s): "TSH", "T4TOTAL", "FREET4", "T3FREE", "THYROIDAB" in the last 72 hours. Anemia Panel: No results for input(s): "VITAMINB12", "FOLATE", "FERRITIN", "TIBC", "IRON", "RETICCTPCT" in the last 72 hours. Urine analysis:    Component Value Date/Time   APPEARANCEUR Clear 10/28/2019 0900   GLUCOSEU 3+ (A) 10/28/2019 0900   BILIRUBINUR Negative 10/28/2019 0900   PROTEINUR Negative 10/28/2019 0900   NITRITE Negative 10/28/2019 0900   LEUKOCYTESUR Negative 10/28/2019 0900   Sepsis Labs: @LABRCNTIP (procalcitonin:4,lacticidven:4) ) Recent Results (from the past 240 hour(s))  SARS Coronavirus 2 by RT PCR (hospital order, performed in [facility] hospital lab) *cepheid single result test* Anterior Nasal Swab     Status: None    Collection Time: 11/12/22  4:55 PM   Specimen: Anterior Nasal Swab  Result Value Ref Range Status   SARS Coronavirus 2 by RT PCR NEGATIVE NEGATIVE Final    Comment: (NOTE) SARS-CoV-2 target nucleic acids are NOT DETECTED.  The SARS-CoV-2 RNA is generally detectable in upper and lower respiratory specimens during the acute phase of infection. The lowest concentration of SARS-CoV-2 viral copies this assay can detect is 250 copies / mL. A negative result does not preclude SARS-CoV-2 infection and should not be used as the sole basis for treatment or other patient management decisions.  A negative result may occur with improper specimen collection / handling, submission of specimen other than nasopharyngeal swab, presence of viral mutation(s) within the areas targeted by this assay, and inadequate number of viral copies (<250 copies / mL). A negative result must be combined with clinical observations, patient history, and epidemiological information.  Fact Sheet for Patients:   RoadLapTop.co.za  Fact Sheet for Healthcare Providers: http://kim-miller.com/  This test is not yet approved or  cleared by the [place] FDA and has been authorized for detection and/or diagnosis of SARS-CoV-2 by FDA under an Emergency Use Authorization (EUA).  This EUA will remain in effect (meaning this test can be used) for the duration of the COVID-19 declaration under Section 564(b)(1) of the Act, 21 U.S.C. section 360bbb-3(b)(1), unless the authorization is terminated or revoked sooner.  Performed at [facility], [address], [place], [place] [address]      Radiological Exams on Admission: DG Chest Portable 1 View  Result Date: 11/12/2022 CLINICAL DATA:  Shortness of breath EXAM: PORTABLE CHEST 1 VIEW COMPARISON:  11/03/2022 FINDINGS: Underinflated x-ray. Small pleural effusions. Interstitial changes with some vascular  congestion. No pneumothorax. Overlapping cardiac leads. Calcified aorta. IMPRESSION: Underinflation with vascular congestion and interstitial changes. Small pleural effusions. Electronically Signed   By: Karen Kays M.D.   On: 11/12/2022 17:15    EKG: Independently reviewed. Sinus rhythm, PAC, RAD.   Assessment/Plan   1. Acute on chronic systolic CHF; mitral regurgitation  - EF was 30-35% and moderate to severe MR noted on TTE from  August 2024  - Jardiance and Coreg discontinued last month d/t worsening renal function and low BP  - Continue diuresis with 40 mg IV Lasix q12h, monitor weight and I/Os, monitor renal function and electrolytes    2. CAD  - No anginal symptoms  - Continue ASA    3. CKD IV  - Appears close to baseline  - Renally-dose medications, follow daily chem panel while diuresing    4. Cirrhosis  - Continue diuretics, recently taken off beta-blocker d/t low BP    5. Insulin-dependent DM  - A1c was 6.6% in February 2024  - Check CBGs, continue long- and short-acting insulin   6. Hyponatremia  - Serum sodium is 131 on admission in setting of hypervolemia  - Follow closely while diuresing    DVT prophylaxis: sq heparin  Code Status: DNR  Level of Care: Level of care: Telemetry Cardiac Family Communication: None present   Disposition Plan:  Patient is from: home  Anticipated d/c is to: TBD Anticipated d/c date is: 11/14/22  Patient currently: Pending improved volume status  Consults called: None  Admission status: Inpatient     Briscoe Deutscher, MD [facility]  11/12/2022, 8:29 PM

## 2022-11-12 NOTE — Telephone Encounter (Signed)
Mal[place], home health physical therapist called to say patient has about a 2lbs weight increase since yesterday and swelling in her ankles. She didn't sleep yesterday because of coughing.  She wants to know if patient should go to the emergency room or increase her fluid pill.

## 2022-11-13 DIAGNOSIS — I5023 Acute on chronic systolic (congestive) heart failure: Secondary | ICD-10-CM | POA: Diagnosis not present

## 2022-11-13 DIAGNOSIS — N184 Chronic kidney disease, stage 4 (severe): Secondary | ICD-10-CM | POA: Diagnosis not present

## 2022-11-13 LAB — HEMOGLOBIN A1C
Hgb A1c MFr Bld: 5.4 % (ref 4.8–5.6)
Mean Plasma Glucose: 108.28 mg/dL

## 2022-11-13 LAB — CBC
HCT: 24.5 % — ABNORMAL LOW (ref 36.0–46.0)
Hemoglobin: 8.2 g/dL — ABNORMAL LOW (ref 12.0–15.0)
MCH: 32.4 pg (ref 26.0–34.0)
MCHC: 33.5 g/dL (ref 30.0–36.0)
MCV: 96.8 fL (ref 80.0–100.0)
Platelets: 143 10*3/uL — ABNORMAL LOW (ref 150–400)
RBC: 2.53 MIL/uL — ABNORMAL LOW (ref 3.87–5.11)
RDW: 24.1 % — ABNORMAL HIGH (ref 11.5–15.5)
WBC: 7.4 10*3/uL (ref 4.0–10.5)
nRBC: 0 % (ref 0.0–0.2)

## 2022-11-13 LAB — BASIC METABOLIC PANEL
Anion gap: 8 (ref 5–15)
BUN: 26 mg/dL — ABNORMAL HIGH (ref 8–23)
CO2: 28 mmol/L (ref 22–32)
Calcium: 8.9 mg/dL (ref 8.9–10.3)
Chloride: 94 mmol/L — ABNORMAL LOW (ref 98–111)
Creatinine, Ser: 1.97 mg/dL — ABNORMAL HIGH (ref 0.44–1.00)
GFR, Estimated: 25 mL/min — ABNORMAL LOW (ref >60)
Glucose, Bld: 186 mg/dL — ABNORMAL HIGH (ref 70–99)
Potassium: 3.7 mmol/L (ref 3.5–5.1)
Sodium: 130 mmol/L — ABNORMAL LOW (ref 135–145)

## 2022-11-13 LAB — GLUCOSE, CAPILLARY
Glucose-Capillary: 137 mg/dL — ABNORMAL HIGH (ref 70–99)
Glucose-Capillary: 122 mg/dL — ABNORMAL HIGH (ref 70–99)
Glucose-Capillary: 130 mg/dL — ABNORMAL HIGH (ref 70–99)
Glucose-Capillary: 149 mg/dL — ABNORMAL HIGH (ref 70–99)

## 2022-11-13 LAB — MAGNESIUM: Magnesium: 2.1 mg/dL (ref 1.7–2.4)

## 2022-11-13 MED ORDER — GUAIFENESIN 100 MG/5ML PO LIQD
10.0000 mL | ORAL | Status: DC | PRN
  Administered 2022-11-13 – 2022-11-14 (×2): 10 mL via ORAL
  Filled 2022-11-13 (×2): qty 10

## 2022-11-13 NOTE — Plan of Care (Signed)
  Problem: Elimination: Goal: Will not experience complications related to bowel motility Outcome: Completed/Met Goal: Will not experience complications related to urinary retention Outcome: Completed/Met   Problem: Pain Managment: Goal: General experience of comfort will improve Outcome: Completed/Met   Problem: Skin Integrity: Goal: Risk for impaired skin integrity will decrease Outcome: Completed/Met   Problem: Nutritional: Goal: Maintenance of adequate nutrition will improve Outcome: Completed/Met   Problem: Skin Integrity: Goal: Risk for impaired skin integrity will decrease Outcome: Completed/Met

## 2022-11-13 NOTE — TOC Initial Note (Signed)
Transition of Care [facility]) - Initial/Assessment Note    Patient Details  Name: Jessica Torres MRN: [id number] Date of Birth: [birth year]  Transition [facility]) CM/SW Contact:    Lockie Pares, RN Phone Number: 11/13/2022, 3:43 PM  Clinical Narrative:                 One of several admissions this year for anemia and CHF. She presented with [facility], had some medication changes. Was found to have a high BNP AKI.  She is active with [facility] for PT OT and RN for home health. She has DME at home. TOC will follow for needs, Tresa Endo from [facility] is aware of admission    Expected Discharge Plan: Home w Home Health Services Barriers to Discharge: Continued Medical Work up   Patient Goals and CMS Choice            Expected Discharge Plan and Services     Post Acute Care Choice: Home Health Living arrangements for the past 2 months: Single Family Home                                   Representative spoke with at [facility] Agency: Active with [facility]  Prior Living Arrangements/Services Living arrangements for the past 2 months: Single Family Home Lives with:: Adult Children Patient language and need for interpreter reviewed:: Yes        Need for Family Participation in Patient Care: Yes (Comment) Care giver support system in place?: Yes (comment) Current home services: DME Criminal Activity/Legal Involvement Pertinent to Current Situation/Hospitalization: No - Comment as needed  Activities of Daily Living      Permission Sought/Granted                  Emotional Assessment       Orientation: : Oriented to Self, Oriented to Place, Oriented to Situation Alcohol / Substance Use: Not Applicable Psych Involvement: No (comment)  Admission diagnosis:  CHF (congestive heart failure) (HCC) [I50.9] Bilateral leg edema [R60.0] Elevated brain natriuretic peptide (BNP) level [R79.89] Acute on chronic combined systolic and diastolic CHF (congestive heart failure)  (HCC) [I50.43] Stage 4 chronic kidney disease (HCC) [N18.4] Patient Active Problem List   Diagnosis Date Noted   Acute on chronic systolic CHF (congestive heart failure) (HCC) 11/12/2022   Palliative care encounter 11/01/2022   Pleural effusion 10/31/2022   Hyponatremia 10/30/2022   Acute on chronic combined systolic and diastolic CHF (congestive heart failure) (HCC) 10/29/2022   Acute kidney injury on chronic kidney disease stage IV (HCC) 10/29/2022   Anemia 07/15/2022   Melena 07/15/2022   Cirrhosis of liver without ascites (HCC) 07/13/2022   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   Gastritis and gastroduodenitis 04/28/2022   NSTEMI (non-ST elevated myocardial infarction) (HCC) 04/27/2022   Gastrointestinal hemorrhage 04/27/2022   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 disease 04/27/2022   Heart failure with preserved ejection fraction (HCC) 04/27/2022   Chronic cough 04/27/2022   History of CVA (cerebrovascular accident) 04/27/2022   Osteopenia 11/28/2020   Iron deficiency 10/11/2020   Stage 4 chronic kidney disease (HCC) 05/25/2020   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   Chronic bilateral low back pain without sciatica 07/13/2019   Carotid stenosis, right 12/10/2017   Occlusion and stenosis of right carotid artery 12/10/2017   Left hip pain 06/04/2017   Claudication in peripheral vascular disease (HCC) 10/30/2016   Vitamin D deficiency 05/07/2016  Gastroesophageal reflux disease without esophagitis 01/28/2016   Coronary artery disease due to lipid rich plaque 10/18/2015   Hypothyroidism 10/18/2015   S/P -Billat SFA PTA with restenosis  07/06/2014   Peripheral vascular angioplasty status 07/06/2014   Hypertension associated with diabetes (HCC) 06/28/2014   Hyperlipidemia-statin intolerant 06/28/2014   Type 2 diabetes mellitus with hyperlipidemia (HCC) 06/28/2014   Peripheral arterial disease - Bilateral SFA  06/28/2014   PCP:  Rebekah Chesterfield, NP Pharmacy:    [facility] [phone] [facility] [place] [place] - [address] [place] [address] Phone: [phone] Fax: [phone]  [facility] [address] [place] [address] Phone: [phone] Fax: [phone]  [facility] [address] [place] [address] Phone: [phone] Fax: [phone]     Social Determinants of Health (SDOH) Social History: SDOH Screenings   Food Insecurity: No Food Insecurity (10/29/2022)  Housing: Low Risk  (10/30/2022)  Transportation Needs: No Transportation Needs (10/30/2022)  Utilities: Not At Risk (10/29/2022)  Alcohol Screen: Low Risk  (10/30/2022)  Depression (PHQ2-9): Medium Risk (02/21/2022)  Financial Resource Strain: Low Risk  (10/30/2022)  Physical Activity: Inactive (09/25/2020)  Social Connections: Unknown (08/27/2022)   Received from [facility]  Stress: No Stress Concern Present (10/12/2022)   Received from [facility]  Tobacco Use: Low Risk  (11/12/2022)   SDOH Interventions:     Readmission Risk Interventions    10/31/2022    5:12 PM  Readmission Risk Prevention Plan  Transportation Screening Complete  HRI or Home Care Consult Complete  Palliative Care Screening Not Applicable  Medication Review (RN Care Manager) Complete

## 2022-11-13 NOTE — Plan of Care (Signed)
  Problem: Clinical Measurements: Goal: Will remain free from infection Outcome: Progressing Goal: Respiratory complications will improve Outcome: Progressing   Problem: Nutrition: Goal: Adequate nutrition will be maintained Outcome: Progressing   Problem: Coping: Goal: Level of anxiety will decrease Outcome: Progressing   

## 2022-11-13 NOTE — Plan of Care (Signed)
  Problem: Clinical Measurements: Goal: Ability to maintain clinical measurements within normal limits will improve Outcome: Progressing Goal: Will remain free from infection Outcome: Progressing   

## 2022-11-13 NOTE — Progress Notes (Signed)
Mobility Specialist Progress Note:    11/13/22 1129  Mobility  Activity Transferred from bed to chair  Level of Assistance Contact guard assist, steadying assist  Assistive Device Front wheel walker  Activity Response Tolerated well  Mobility Referral Yes  $Mobility charge 1 Mobility  Mobility Specialist Start Time (ACUTE ONLY) 0945  Mobility Specialist Stop Time (ACUTE ONLY) 1005  Mobility Specialist Time Calculation (min) (ACUTE ONLY) 20 min   Received pt in bed having no complaints and agreeable to mobility. C/o minor tenderness in feet, otherwise no c/o. Was able to take a couple steps towards the chair w/o fault. Call bell in reach and all needs met.    Thompson Grayer Mobility Specialist  Please contact vis Secure Chat or  Rehab Office [phone]

## 2022-11-13 NOTE — Progress Notes (Signed)
PROGRESS NOTE    Jessica Torres  VWU:[id number] DOB: Aug 26, 1940 DOA: 11/12/2022 PCP: Rebekah Chesterfield, NP   Brief Narrative:  82 y.o. 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  On presentation, she appeared to be in volume overload with chest x-ray notable for vascular congestion and small pleural effusions with BNP of 4449 and troponin of 68 and creatinine of 1.92.  She was started on IV Lasix.  Assessment & Plan:   Acute on chronic systolic CHF Moderate to severe MR -EF was 30-35% and moderate to severe MR noted on TTE from August 2024  - Jardiance and Coreg discontinued last month d/t worsening renal function and low BP  -Continue IV Lasix.  Strict input and output.  Daily weights.  Fluid restriction.  I have consulted cardiology.  Follow recommendations.  CAD -Stable.  Continue aspirin.  No anginal symptoms  Diabetes mellitus type 2 with hyperglycemia -A1c 5.4.  Continue long-acting insulin along with CBGs with SSI.  Carb modified diet  CKD stage IV -Creatinine close to baseline.  Monitor  Hypothyroidism--continue levothyroxine  GERD -Continue Protonix  Cirrhosis of liver with history of esophageal varices -Continue diuretics.  Recently taken off beta-blocker due to low blood pressure  Chronic respiratory failure with hypoxia -Respiratory status currently stable.  Currently on 2 L oxygen by nasal cannula which she wears at home.  Anemia of chronic disease From chronic illnesses.  Hemoglobin stable.  Monitor intermittently  Thrombocytopenia -No signs of bleeding.   Monitor.  Hyponatremia--possibly from hypervolemia.  Continue diuretics.  Monitor  Goals of care -Patient is a DNR.  Overall prognosis is poor.  Palliative care evaluated the patient during recent hospitalization and patient was not interested in palliative care conversations at that point.  DVT prophylaxis: Heparin subcutaneous Code Status: DNR Family Communication: None at bedside Disposition Plan: Status is: Inpatient Remains inpatient appropriate because: Of severity of illness    Consultants: Cardiology  Procedures: None  Antimicrobials: None   Subjective: Patient seen and examined at bedside.  Feels slightly better but still short of breath with exertion with intermittent cough.  Denies any chest pain, nausea or vomiting.  Objective: Vitals:   11/12/22 2020 11/13/22 0000 11/13/22 0410 11/13/22 0744  BP: (!) 114/55 (!) 107/54 (!) 102/47 (!) 110/54  Pulse: 88 94 90 89  Resp: 15 18 18 18   Temp: 98.5 F (36.9 C) 98.8 F (37.1 C) 98.8 F (37.1 C) 98.7 F (37.1 C)  TempSrc: Oral Axillary Oral Oral  SpO2: 95% 95% 92% 94%  Weight:   56.7 kg     Intake/Output Summary (Last 24 hours) at 11/13/2022 1017 Last data filed at 11/13/2022 0549 Gross per 24 hour  Intake 318 ml  Output 650 ml  Net -332 ml   Filed Weights   11/13/22 0410  Weight: 56.7 kg    Examination:  General exam: Appears calm and comfortable.  On 2 L oxygen by nasal cannula.  Elderly female lying in bed. Respiratory system: Bilateral decreased breath sounds at bases with scattered crackles Cardiovascular system: S1 & S2 heard, Rate controlled Gastrointestinal system: Abdomen is nondistended, soft and nontender. Normal bowel sounds heard. Extremities: No cyanosis, clubbing; bilateral  lower extremity edema present  Central nervous system: Alert and oriented.  Slow to respond.  No focal neurological deficits. Moving extremities Skin: No rashes, lesions or ulcers Psychiatry: Flat affect.  Not  agitated.   Data Reviewed: I have personally reviewed following labs and imaging studies  CBC: Recent Labs  Lab 11/07/22 0637 11/12/22 1556 11/13/22 0240  WBC 6.3 8.8 7.4  HGB 8.9* 9.2* 8.2*  HCT 27.7* 28.0* 24.5*  MCV 92.6 96.9 96.8  PLT 237 140* 143*   Basic Metabolic Panel: Recent Labs  Lab 11/07/22 0637 11/12/22 1556 11/13/22 0240  NA 133* 131* 130*  K 3.5 4.2 3.7  CL 93* 94* 94*  CO2 31 27 28   GLUCOSE 193* 135* 186*  BUN 26* 28* 26*  CREATININE 1.63* 1.92* 1.97*  CALCIUM 9.3 9.5 8.9  MG  --   --  2.1   GFR: Estimated Creatinine Clearance: 20.7 mL/min (A) (by C-G formula based on SCr of 1.97 mg/dL (H)). Liver Function Tests: No results for input(s): "AST", "ALT", "ALKPHOS", "BILITOT", "PROT", "ALBUMIN" in the last 168 hours. No results for input(s): "LIPASE", "AMYLASE" in the last 168 hours. No results for input(s): "AMMONIA" in the last 168 hours. Coagulation Profile: No results for input(s): "INR", "PROTIME" in the last 168 hours. Cardiac Enzymes: No results for input(s): "CKTOTAL", "CKMB", "CKMBINDEX", "TROPONINI" in the last 168 hours. BNP (last 3 results) No results for input(s): "PROBNP" in the last 8760 hours. HbA1C: Recent Labs    11/13/22 0240  HGBA1C 5.4   CBG: Recent Labs  Lab 11/06/22 2125 11/07/22 0623 11/07/22 1228 11/12/22 2242 11/13/22 0547  GLUCAP 263* 193* 261* 85 137*   Lipid Profile: No results for input(s): "CHOL", "HDL", "LDLCALC", "TRIG", "CHOLHDL", "LDLDIRECT" in the last 72 hours. Thyroid Function Tests: No results for input(s): "TSH", "T4TOTAL", "FREET4", "T3FREE", "THYROIDAB" in the last 72 hours. Anemia Panel: No results for input(s): "VITAMINB12", "FOLATE", "FERRITIN", "TIBC", "IRON", "RETICCTPCT" in the last 72 hours. Sepsis Labs: No results for input(s): "PROCALCITON", "LATICACIDVEN" in the last 168 hours.  Recent Results (from the past 240 hour(s))  SARS Coronavirus 2 by RT PCR (hospital order, performed in  [facility] hospital lab) *cepheid single result test* Anterior Nasal Swab     Status: None   Collection Time: 11/12/22  4:55 PM   Specimen: Anterior Nasal Swab  Result Value Ref Range Status   SARS Coronavirus 2 by RT PCR NEGATIVE NEGATIVE Final    Comment: (NOTE) SARS-CoV-2 target nucleic acids are NOT DETECTED.  The SARS-CoV-2 RNA is generally detectable in upper and lower respiratory specimens during the acute phase of infection. The lowest concentration of SARS-CoV-2 viral copies this assay can detect is 250 copies / mL. A negative result does not preclude SARS-CoV-2 infection and should not be used as the sole basis for treatment or other patient management decisions.  A negative result may occur with improper specimen collection / handling, submission of specimen other than nasopharyngeal swab, presence of viral mutation(s) within the areas targeted by this assay, and inadequate number of viral copies (<250 copies / mL). A negative result must be combined with clinical observations, patient history, and epidemiological information.  Fact Sheet for Patients:   RoadLapTop.co.za  Fact Sheet for Healthcare Providers: http://kim-miller.com/  This test is not yet approved or  cleared by the [place] FDA and has been authorized for detection and/or diagnosis of SARS-CoV-2 by FDA under an Emergency Use Authorization (EUA).  This EUA will remain in effect (meaning this  test can be used) for the duration of the COVID-19 declaration under Section 564(b)(1) of the Act, 21 U.S.C. section 360bbb-3(b)(1), unless the authorization is terminated or revoked sooner.  Performed at [facility], [address] [place], [place] [address]          Radiology Studies: DG Chest Portable 1 View  Result Date: 11/12/2022 CLINICAL DATA:  Shortness of breath EXAM: PORTABLE CHEST 1 VIEW COMPARISON:  11/03/2022 FINDINGS:  Underinflated x-ray. Small pleural effusions. Interstitial changes with some vascular congestion. No pneumothorax. Overlapping cardiac leads. Calcified aorta. IMPRESSION: Underinflation with vascular congestion and interstitial changes. Small pleural effusions. Electronically Signed   By: Karen Kays M.D.   On: 11/12/2022 17:15        Scheduled Meds:  aspirin  81 mg Oral Daily   furosemide  40 mg Intravenous Q12H   heparin  5,000 Units Subcutaneous Q8H   insulin aspart  0-5 Units Subcutaneous QHS   insulin aspart  0-6 Units Subcutaneous TID WC   insulin glargine-yfgn  17 Units Subcutaneous QHS   levothyroxine  88 mcg Oral QAC breakfast   pantoprazole  40 mg Oral BID   sodium chloride flush  3 mL Intravenous Q12H   Continuous Infusions:        Glade Lloyd, MD [facility] 11/13/2022, 10:17 AM

## 2022-11-13 NOTE — Consult Note (Addendum)
Cardiology Consultation   Patient ID: Jessica Torres MRN: [id number]; DOB: Feb 05, 1941  Admit date: 11/12/2022 Date of Consult: 11/13/2022  PCP:  Rebekah Chesterfield, NP   [facility] Providers Cardiologist:  Dietrich Pates, MD        Patient Profile:   Jessica Torres is a 82 y.o. female with a hx of PAD, presumed CAD, carotid artery disease, hypertension, hyperlipidemia, DM2, cirrhosis, history of GI bleed w GAVE/gastritis, CKD stage IIIb, GERD, hypothyroidism, CVA and OSA who is being seen 11/13/2022 for the evaluation of CHF at the request of Dr. Hanley Ben.  History of Present Illness:   Jessica Torres is a 82 year old female with past medical history of PAD, presumed CAD, carotid artery disease, hypertension, hyperlipidemia, DM2, cirrhosis, history of GI bleed w GAVE/gastritis, CKD stage IIIb, GERD, hypothyroidism, CVA and OSA.  She has underwent multiple lower extremity angiography in June 2015, April 2016, November 2016 and August 2018.  She was referred to vascular surgery for consideration of left femoropopliteal bypass hardware was felt to be not a viable option due to lack of adequate venous conduit.  Her last visit with Dr. Allyson Sabal was in May 2022.    She was readmitted to the hospital in February 2024 with symptomatic anemia.  She was transfused with 2 units of packed red blood cell.  EGD performed on 04/28/2022 revealed erythematous antral mucosa suspected secondary to gastritis or GAVE as well as gastric polyps.  She underwent a repeat EGD on 04/30/2022 after Plavix washout to undergo APC.    Patient was readmitted to the hospital in May 2024 after her PCP found her hemoglobin was severely low down to 6.1 again.  Serial troponin was trended up to 900 during the hospitalization.  Plavix was held.  Echocardiogram obtained on 5//2024 showed EF 50 to 55% with apical septal akinesis, EF is down from the previous 60 to 65% in February 2024.  Her chest pain resolved with blood transfusion.  Repeat  limited echocardiogram with Definity obtained on 07/14/2022 shows EF is around 40 to 45% with hypokinesis of the mid to distal lateral and mid to distal septal and distal inferior and apical wall.  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  Patient was started on Protonix.    She was readmitted at [facility] for recurrent anemia in June and for acute congestive heart failure in July.  Repeat echocardiogram obtained at [facility] on 10/09/2022 showed EF 40 to 45%, anteroapical hypokinesis, mild to moderate MR.  After her admission at [facility], she was seen by [facility] cardiologist on 10/14/2022, heart failure medication at the time included Jardiance, Lasix and low-dose carvedilol.  Carvedilol was discontinued and she was switched back to metoprolol succinate.  Outpatient nuclear stress test was scheduled for 10/24/2022.  Per outside record, it was technically difficult study was noncardiac tracer uptake noted, however there was interpretable, there was no convincing evidence of inducible ischemia by myocardial perfusion imaging, medium sized fixed defect at the apex with associated akinesis consistent with prior MI, severely reduced LV systolic function.  Most recently, patient was readmitted to the hospital on 10/29/2022.  He was mentioned during the hospital note that the patient has been largely bedbound.  She underwent IV diuresis.  Her overall frailty and comorbidities made her a poor candidate for coronary angiography even though  there was high suspicion that she has multivessel disease.  She was also felt to be high risk for renal failure with contrast exposure.  Cardiology service recommended palliative care discussion.  She did have a large right pleural effusion and underwent successful right thoracentesis by  interventional radiology service on 11/01/2022 yielding 1.2 L of hazy yellow fluid.  Post procedure, patient did have improvement in her breathing and resolution of her cough.  She was ultimately transitioned to 40 mg daily of oral Lasix with extra as needed dose in the afternoon.  Echocardiogram obtained on 9/24 showed EF 30 to 35%, severe hypokinesis of the LV and mid to apical inferoseptal wall, septal wall, lateral wall and apical segment, small pericardial effusion, moderate to severe MR, RV systolic pressure of 39.4 mmHg.  Palliative care service met with family, patient remains DNR however not open to hospice at this time.  Further discussion will be held down the road as she deteriorate further.  Beta-blocker and Marcelline Deist were discontinued.  Patient was placed on Jardiance 25 mg instead.    Since her discharge, she says she has moved in with her sister for the past few days.  She was initially doing well.  Her sister was managing her medication.  However in the past 2 days, she started noticing increasing lower extremity edema.  She also felt dyspneic when she lay flat.  She tried to take extra dose of Lasix however did not notice any increase in urine output.  This prompted the patient to return back to the hospital on 11/12/2022.  On arrival, creatinine was 1.92.  BNP elevated at 4449.  Sodium 131.  She was given 40 mg IV Lasix.  Cardiology service reconsulted for acute on chronic systolic heart failure.    Past Medical History:  Diagnosis Date   Arthritis    "fingers probably" (10/30/2016)   Bulging lumbar disc    Chronic lower back pain    Difficulty urinating    GERD (gastroesophageal reflux disease)    Heart murmur    hx   Hyperlipidemia    statin intolerant   Hypertension    Hypothyroidism    PAD (peripheral artery disease) (HCC)    Sleep apnea    "dx'd years ago; had severe problems; all of the sudden it just went away" (07/06/2014) *still gone" (10/30/2016)   Stroke [facility]) ~ 2010    denies residual on 10/30/2016   Type 2 diabetes mellitus (HCC)    Urgency of urination    Uterine cancer [facility])     Past Surgical History:  Procedure Laterality Date   ANGIOPLASTY Right 01/22/2015   w/ atherectomy R sfa   BALLOON ANGIOPLASTY, ARTERY Right    SFA/notes 07/06/2014   BIOPSY  04/28/2022   Procedure: BIOPSY;  Surgeon: Tressia Danas, MD;  Location: [facility] ENDOSCOPY;  Service: Gastroenterology;;   BREAST EXCISIONAL BIOPSY Left    BREAST SURGERY     CARPAL TUNNEL RELEASE Right 1980's   COLONOSCOPY WITH PROPOFOL N/A 11/26/2020   Procedure: COLONOSCOPY WITH PROPOFOL;  Surgeon: Lanelle Bal, DO;  Location: [facility] ENDO SUITE;  Service: Endoscopy;  Laterality: N/A;  9:00am, pt cannot come in earlier   DILATION AND CURETTAGE OF UTERUS     ESOPHAGOGASTRODUODENOSCOPY N/A 04/28/2022   Procedure: ESOPHAGOGASTRODUODENOSCOPY (EGD);  Surgeon: Tressia Danas, MD;  Location: [facility] ENDOSCOPY;  Service: Gastroenterology;  Laterality: N/A;   ESOPHAGOGASTRODUODENOSCOPY (EGD) WITH PROPOFOL N/A 04/30/2022   Procedure: ESOPHAGOGASTRODUODENOSCOPY (EGD) WITH PROPOFOL;  Surgeon: Tressia Danas, MD;  Location:  [facility] ENDOSCOPY;  Service: Gastroenterology;  Laterality: N/A;  With APC   ESOPHAGOGASTRODUODENOSCOPY (EGD) WITH PROPOFOL N/A 07/15/2022   Procedure: ESOPHAGOGASTRODUODENOSCOPY (EGD) WITH PROPOFOL;  Surgeon: Dolores Frame, MD;  Location: [facility] ENDO SUITE;  Service: Gastroenterology;  Laterality: N/A;   HEMORRHOID SURGERY     "dr lanced it; in dr's office"   HOT HEMOSTASIS N/A 04/30/2022   Procedure: HOT HEMOSTASIS (ARGON PLASMA COAGULATION/BICAP);  Surgeon: Tressia Danas, MD;  Location: [facility] ENDOSCOPY;  Service: Gastroenterology;  Laterality: N/A;   HOT HEMOSTASIS  07/15/2022   Procedure: HOT HEMOSTASIS (ARGON PLASMA COAGULATION/BICAP);  Surgeon: Marguerita Merles, Reuel Boom, MD;  Location: [facility] ENDO SUITE;  Service: Gastroenterology;;   INCISION AND DRAINAGE BREAST ABSCESS Left    LOWER EXTREMITY  ANGIOGRAM N/A 07/06/2014   Procedure: LOWER EXTREMITY ANGIOGRAM & R-SFA atherectomy and balloon angioplasty;  Surgeon: Runell Gess, MD;  Location: [facility] CATH LAB;  Service: Cardiovascular;  Laterality: R   LOWER EXTREMITY ANGIOGRAM  10/30/2016   LOWER EXTREMITY ANGIOGRAPHY N/A 10/30/2016   Procedure: Lower Extremity Angiography;  Surgeon: Runell Gess, MD;  Location: [facility] INVASIVE CV LAB;  Service: Cardiovascular;  Laterality: N/A;   PERIPHERAL VASCULAR CATHETERIZATION N/A 08/17/2014   Procedure: Lower Extremity Angiography;  Surgeon: Runell Gess, MD;  Location: [facility] INVASIVE CV LAB;  Service: Cardiovascular;  Laterality: N/A;   PERIPHERAL VASCULAR CATHETERIZATION Left 08/17/2014   Procedure: Peripheral Vascular Atherectomy and Viabahn stent L-SFA;  Surgeon: Runell Gess, MD; Laterality: Left; L-SFA   PERIPHERAL VASCULAR CATHETERIZATION N/A 01/11/2015   Procedure: Lower Extremity Angiography;  Surgeon: Runell Gess, MD;  Location: [facility] INVASIVE CV LAB;  Service: Cardiovascular;  Laterality: N/A;   PERIPHERAL VASCULAR CATHETERIZATION Right 01/22/2015   Procedure: Peripheral Vascular Atherectomy;  Surgeon: Runell Gess, MD;  Location: [facility] INVASIVE CV LAB;  Service: Cardiovascular;  Laterality: Right;   POLYPECTOMY  11/26/2020   Procedure: POLYPECTOMY;  Surgeon: Lanelle Bal, DO;  Location: [facility] ENDO SUITE;  Service: Endoscopy;;   POLYPECTOMY  07/15/2022   Procedure: POLYPECTOMY INTESTINAL;  Surgeon: Dolores Frame, MD;  Location: [facility] ENDO [facility]ITE;  Service: Gastroenterology;;   TUBAL LIGATION     VAGINAL HYSTERECTOMY       Home Medications:  Prior to Admission medications   Medication Sig Start Date End Date Taking? Authorizing Provider  aspirin 81 MG chewable tablet Chew 1 tablet (81 mg total) by mouth daily. 07/18/22  Yes Tyrone Nine, MD  dextromethorphan 15 MG/5ML syrup Take 10 mLs by mouth 4 (four) times daily as needed for cough.   Yes [provider]   empagliflozin (JARDIANCE) 25 MG TABS tablet Take 25 mg by mouth daily.   Yes [provider]  ezetimibe (ZETIA) 10 MG tablet Take 1 tablet (10 mg total) by mouth daily. Patient taking differently: Take 10 mg by mouth at bedtime. 08/22/21  Yes Jannifer Rodney A, FNP  fenofibrate 160 MG tablet TAKE 1 TABLET DAILY 04/02/22  Yes Hawks, Christy A, FNP  furosemide (LASIX) 20 MG tablet Take 2 tablets (40 mg total) by mouth daily. Take 2tabs in morning and take an extra dose as needed for weight gain of 3lb in 1 day or 5lb in 1 week 11/07/22  Yes Zannie Cove, MD  insulin glargine, 2 Unit Dial, (TOUJEO MAX SOLOSTAR) 300 UNIT/ML Solostar Pen Inject 26 Units into the skin at bedtime. 11/07/22 01/06/23 Yes Zannie Cove, MD  insulin lispro (HUMALOG) 100 UNIT/ML KwikPen Inject 10-20 Units into the skin 3 (three) times  daily. 08/22/21  Yes Hawks, Edilia Bo, FNP  levothyroxine (SYNTHROID) 88 MCG tablet TAKE 1 TABLET DAILY BEFORE BREAKFAST Patient taking differently: Take 88 mcg by mouth daily before breakfast. 04/24/22  Yes Hawks, Christy A, FNP  ondansetron (ZOFRAN) 4 MG tablet Take 1 tablet (4 mg total) by mouth every 8 (eight) hours as needed for nausea or vomiting. 11/07/22  Yes Zannie Cove, MD  pantoprazole (PROTONIX) 40 MG tablet Take 1 tablet (40 mg total) by mouth 2 (two) times daily. Follow up with gastroenterology for additional recommendations Patient taking differently: Take 40 mg by mouth daily. Follow up with gastroenterology for additional recommendations 07/17/22  Yes Tyrone Nine, MD  Accu-Chek Softclix Lancets lancets Test BS TID Dx E11.9 12/02/21   Junie Spencer, FNP  BD PEN NEEDLE NANO 2ND GEN 32G X 4 MM MISC Use twice daily with insulin Dx E11.9 11/25/21   Jannifer Rodney A, FNP  glucose blood (FREESTYLE LITE) test strip Test BS TID Dx E11.9 11/25/21   Junie Spencer, FNP  metFORMIN (GLUCOPHAGE) 1000 MG tablet Take 1 tablet (1,000 mg total) by mouth 2 (two) times daily with a  meal. Patient taking differently: Take 1,000 mg by mouth 2 (two) times daily with a meal. Only taking once a day 04/06/19 07/13/19  Remus Loffler, PA-C    Inpatient Medications: Scheduled Meds:  aspirin  81 mg Oral Daily   furosemide  40 mg Intravenous Q12H   heparin  5,000 Units Subcutaneous Q8H   insulin aspart  0-5 Units Subcutaneous QHS   insulin aspart  0-6 Units Subcutaneous TID WC   insulin glargine-yfgn  17 Units Subcutaneous QHS   levothyroxine  88 mcg Oral QAC breakfast   pantoprazole  40 mg Oral BID   sodium chloride flush  3 mL Intravenous Q12H   Continuous Infusions:  PRN Meds: acetaminophen **OR** acetaminophen, guaiFENesin, ondansetron **OR** ondansetron (ZOFRAN) IV, senna-docusate  Allergies:    Allergies  Allergen Reactions   Morphine And Codeine Swelling and Other (See Comments)    Facial swelling   Latex Rash   Statins Other (See Comments)    Myalgias     Social History:   Social History   Socioeconomic History   Marital status: Divorced    Spouse name: Not on file   Number of children: 5   Years of education: Not on file   Highest education level: High school graduate  Occupational History   Occupation: retired  Tobacco Use   Smoking status: Never   Smokeless tobacco: Never  Vaping Use   Vaping status: Never Used  Substance and Sexual Activity   Alcohol use: Not Currently   Drug use: No   Sexual activity: Never  Other Topics Concern   Not on file  Social History Narrative   2 sons and their sons are living with her right now   Another son lives next door and daughter lives in [place]   Social Determinants of Health   Financial Resource Strain: Low Risk  (10/30/2022)   Overall Financial Resource Strain (CARDIA)    Difficulty of Paying Living Expenses: Not hard at all  Food Insecurity: No Food Insecurity (10/29/2022)   Hunger Vital Sign    Worried About Running Out of Food in the Last Year: Never true    Ran Out of Food in the Last  Year: Never true  Transportation Needs: No Transportation Needs (10/30/2022)   PRAPARE - Administrator, Civil Service (Medical): No  Lack of Transportation (Non-Medical): No  Physical Activity: Inactive (09/25/2020)   Exercise Vital Sign    Days of Exercise per Week: 0 days    Minutes of Exercise per Session: 0 min  Stress: No Stress Concern Present (10/12/2022)   Received from [facility] - Occupational Stress Questionnaire    Feeling of Stress : Only a little  Social Connections: Unknown (08/27/2022)   Received from [facility]   Social Network    Social Network: Not on file  Intimate Partner Violence: Not At Risk (10/29/2022)   Humiliation, Afraid, Rape, and Kick questionnaire    Fear of Current or Ex-Partner: No    Emotionally Abused: No    Physically Abused: No    Sexually Abused: No    Family History:    Family History  Problem Relation Age of Onset   Heart attack Mother    Heart disease Mother    Arrhythmia Mother        has PPM   Heart disease Father    Diabetes Sister    Breast cancer Neg Hx      ROS:  Please see the history of present illness.   All other ROS reviewed and negative.     Physical Exam/Data:   Vitals:   11/12/22 2020 11/13/22 0000 11/13/22 0410 11/13/22 0744  BP: (!) 114/55 (!) 107/54 (!) 102/47 (!) 110/54  Pulse: 88 94 90 89  Resp: 15 18 18 18   Temp: 98.5 F (36.9 C) 98.8 F (37.1 C) 98.8 F (37.1 C) 98.7 F (37.1 C)  TempSrc: Oral Axillary Oral Oral  SpO2: 95% 95% 92% 94%  Weight:   56.7 kg     Intake/Output Summary (Last 24 hours) at 11/13/2022 1015 Last data filed at 11/13/2022 0549 Gross per 24 hour  Intake 318 ml  Output 650 ml  Net -332 ml      11/13/2022    4:10 AM 11/07/2022    5:19 AM 11/06/2022    4:40 AM  Last 3 Weights  Weight (lbs) 125 lb 126 lb 12.2 oz 123 lb 7.3 oz  Weight (kg) 56.7 kg 57.5 kg 56 kg     Body mass index is 20.8 kg/m.  General:  Frail HEENT: normal Neck: no JVD Vascular: No carotid bruits; Distal pulses 2+ bilaterally Cardiac:  normal S1, S2; RRR; 3/6 murmur Lungs: Diminished breath sounds in bilateral bases Abd: soft, nontender, no hepatomegaly  Ext: no edema Musculoskeletal:  No deformities, BUE and BLE strength normal and equal Skin: warm and dry  Neuro:  CNs 2-12 intact, no focal abnormalities noted Psych:  Normal affect   EKG:  The EKG was personally reviewed and demonstrates: Normal sinus rhythm, poor blood pressure in the anterior leads. Telemetry:  Telemetry was personally reviewed and demonstrates: Normal sinus rhythm, no significant ventricular ectopy  Relevant CV Studies:  Limited echo 11/01/2022  1. Limited study   2. Left ventricular ejection fraction, by estimation, is 30 to 35%. Left  ventricular ejection fraction by 2D MOD biplane is 32.2 %. The left  ventricle has moderately decreased function. The left ventricle  demonstrates regional wall motion abnormalities  (see scoring diagram/findings for description). diastolic function is  indeterminate due to lack of tissue doppler. There is severe hypokinesis  of the left ventricular, mid-apical inferoseptal wall, septal wall,  lateral wall and apical segment.   3. A small pericardial effusion is present.   4. Moderate to severe mitral valve regurgitation.  5. The aortic root is heaily calcified, but there does not appears to be  significant aortic stenosis. Doppler velocities were not obtained.   6. There is mildly elevated pulmonary artery systolic pressure. The  estimated right ventricular systolic pressure is 39.4 mmHg.   7. The inferior vena cava is dilated in size with <50% respiratory  variability, suggesting right atrial pressure of 15 mmHg.   Comparison(s): Changes from prior study are noted. 07/14/2022: LVEF 40-45%,  mid/distal lateral, mid/distal septal, distal inferior and apical walls.   Laboratory Data:  High Sensitivity  Troponin:   Recent Labs  Lab 11/12/22 1556 11/12/22 1800  TROPONINIHS 68* 70*     Chemistry Recent Labs  Lab 11/07/22 0637 11/12/22 1556 11/13/22 0240  NA 133* 131* 130*  K 3.5 4.2 3.7  CL 93* 94* 94*  CO2 31 27 28   GLUCOSE 193* 135* 186*  BUN 26* 28* 26*  CREATININE 1.63* 1.92* 1.97*  CALCIUM 9.3 9.5 8.9  MG  --   --  2.1  GFRNONAA 32* 26* 25*  ANIONGAP 9 10 8     No results for input(s): "PROT", "ALBUMIN", "AST", "ALT", "ALKPHOS", "BILITOT" in the last 168 hours. Lipids No results for input(s): "CHOL", "TRIG", "HDL", "LABVLDL", "LDLCALC", "CHOLHDL" in the last 168 hours.  Hematology Recent Labs  Lab 11/07/22 0637 11/12/22 1556 11/13/22 0240  WBC 6.3 8.8 7.4  RBC 2.99* 2.89* 2.53*  HGB 8.9* 9.2* 8.2*  HCT 27.7* 28.0* 24.5*  MCV 92.6 96.9 96.8  MCH 29.8 31.8 32.4  MCHC 32.1 32.9 33.5  RDW 21.4* 24.0* 24.1*  PLT 237 140* 143*   Thyroid No results for input(s): "TSH", "FREET4" in the last 168 hours.  BNP Recent Labs  Lab 11/12/22 1549  BNP 4,449.1*    DDimer No results for input(s): "DDIMER" in the last 168 hours.   Radiology/Studies:  DG Chest Portable 1 View  Result Date: 11/12/2022 CLINICAL DATA:  Shortness of breath EXAM: PORTABLE CHEST 1 VIEW COMPARISON:  11/03/2022 FINDINGS: Underinflated x-ray. Small pleural effusions. Interstitial changes with some vascular congestion. No pneumothorax. Overlapping cardiac leads. Calcified aorta. IMPRESSION: Underinflation with vascular congestion and interstitial changes. Small pleural effusions. Electronically Signed   By: Karen Kays M.D.   On: 11/12/2022 17:15     Assessment and Plan:   Acute on chronic systolic heart failure - progressive deterioration of EF since Feb 2024, EF in Feb was 60-65%, then dropped down to 50-55% by May with wall motion abnormality, repeat limited echo in May showed EF 45%.   -Recent echocardiogram obtained on 11/01/2022 demonstrated EF 30 to 35%, severe hypokinesis of the LV, mid  apical inferoseptal wall, lateral wall and apical segment, moderate to severe MR  -Recently underwent a right thoracentesis with removal of 1.2 L of straw-colored fluid.  Repeat x-ray obtained during this admission shows no significant recurrence.  -Continue with Jardiance and IV Lasix 40 mg twice daily.  Likely will transition to 40 mg BID tomorrow.  CKD stage IV: Creatinine 1.97, stable.  Given recurrent admission, may tolerate slightly higher creatinine to keep the patient on the dry side.  Presumed CAD: Poor candidate for any invasive study given her underlying frailty and comorbidities.  During last hospitalization, cardiology service recommended palliative care discussion.  Palliative care service did have a meeting with family was undecided on whether to pursue hospice.  History of cirrhosis  Hyponatremia: Followed closely, level stable when compared to the recent blood work.  History of anemia: Previously admitted in  February, May and June for anemia.  Underwent multiple argon plasma coagulation treatment   Risk Assessment/Risk Scores:        New York Heart Association (NYHA) Functional Class NYHA Class IV        For questions or updates, please contact [facility] Please consult [email or web address] for contact info under    Ramond Dial, PA  11/13/2022 10:15 AM

## 2022-11-13 NOTE — Progress Notes (Signed)
   11/12/22 2020  Vitals  Temp 98.5 F (36.9 C)  Temp Source Oral  BP (!) 114/55  MAP (mmHg) 71  BP Location Left Arm  BP Method Automatic  Patient Position (if appropriate) Lying  Pulse Rate 88  Pulse Rate Source Monitor  ECG Heart Rate 88  Resp 15  Level of Consciousness  Level of Consciousness Alert  MEWS COLOR  MEWS Score Color Green  Oxygen Therapy  SpO2 95 %  O2 Device Nasal Cannula  O2 Flow Rate (L/min) 2 L/min  MEWS Score  MEWS Temp 0  MEWS Systolic 0  MEWS Pulse 0  MEWS RR 0  MEWS LOC 0  MEWS Score 0   Patient came up as a new admission from [facility], Patient is alert and oriented, denies any complaints, vitals are stable. [facility]mitting paged to make aware of patients arrival to the floor.

## 2022-11-14 DIAGNOSIS — Z515 Encounter for palliative care: Secondary | ICD-10-CM | POA: Diagnosis not present

## 2022-11-14 DIAGNOSIS — Z7189 Other specified counseling: Secondary | ICD-10-CM | POA: Diagnosis not present

## 2022-11-14 DIAGNOSIS — I5023 Acute on chronic systolic (congestive) heart failure: Secondary | ICD-10-CM | POA: Diagnosis not present

## 2022-11-14 LAB — GLUCOSE, CAPILLARY
Glucose-Capillary: 100 mg/dL — ABNORMAL HIGH (ref 70–99)
Glucose-Capillary: 175 mg/dL — ABNORMAL HIGH (ref 70–99)
Glucose-Capillary: 177 mg/dL — ABNORMAL HIGH (ref 70–99)
Glucose-Capillary: 215 mg/dL — ABNORMAL HIGH (ref 70–99)

## 2022-11-14 LAB — CBC
HCT: 24.1 % — ABNORMAL LOW (ref 36.0–46.0)
Hemoglobin: 7.9 g/dL — ABNORMAL LOW (ref 12.0–15.0)
MCH: 31.2 pg (ref 26.0–34.0)
MCHC: 32.8 g/dL (ref 30.0–36.0)
MCV: 95.3 fL (ref 80.0–100.0)
Platelets: 158 10*3/uL (ref 150–400)
RBC: 2.53 MIL/uL — ABNORMAL LOW (ref 3.87–5.11)
RDW: 24.2 % — ABNORMAL HIGH (ref 11.5–15.5)
WBC: 7 10*3/uL (ref 4.0–10.5)
nRBC: 0 % (ref 0.0–0.2)

## 2022-11-14 LAB — BASIC METABOLIC PANEL
Anion gap: 12 (ref 5–15)
BUN: 27 mg/dL — ABNORMAL HIGH (ref 8–23)
CO2: 28 mmol/L (ref 22–32)
Calcium: 9.2 mg/dL (ref 8.9–10.3)
Chloride: 95 mmol/L — ABNORMAL LOW (ref 98–111)
Creatinine, Ser: 2.17 mg/dL — ABNORMAL HIGH (ref 0.44–1.00)
GFR, Estimated: 23 mL/min — ABNORMAL LOW (ref >60)
Glucose, Bld: 128 mg/dL — ABNORMAL HIGH (ref 70–99)
Potassium: 3.5 mmol/L (ref 3.5–5.1)
Sodium: 135 mmol/L (ref 135–145)

## 2022-11-14 LAB — MAGNESIUM: Magnesium: 2.3 mg/dL (ref 1.7–2.4)

## 2022-11-14 MED ORDER — POTASSIUM CHLORIDE CRYS ER 20 MEQ PO TBCR
40.0000 meq | EXTENDED_RELEASE_TABLET | Freq: Once | ORAL | Status: AC
  Administered 2022-11-14: 40 meq via ORAL
  Filled 2022-11-14: qty 2

## 2022-11-14 MED ORDER — TORSEMIDE 20 MG PO TABS
40.0000 mg | ORAL_TABLET | Freq: Every day | ORAL | Status: DC
  Administered 2022-11-14 – 2022-11-15 (×2): 40 mg via ORAL
  Filled 2022-11-14 (×2): qty 2

## 2022-11-14 NOTE — Care Management Important Message (Signed)
Important Message  Patient Details  Name: Jessica Torres MRN: [id number] Date of Birth: 01-29-1941   Medicare Important Message Given:  Yes     Renie Ora 11/14/2022, 8:13 AM

## 2022-11-14 NOTE — Consult Note (Signed)
Palliative Care Consult Note                                  Date: 11/14/2022   Patient Name: Jessica Torres  DOB: 1940-11-13  MRN: [id number]  Age / Sex: 82 y.o., female  PCP: Rebekah Chesterfield, NP Referring Physician: Glade Lloyd, MD  Reason for Consultation: Establishing goals of care  HPI/Patient Profile: 82 y.o. 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   Past Medical History:  Diagnosis Date   Arthritis    "fingers probably" (10/30/2016)   Bulging lumbar disc    Chronic lower back pain    Difficulty urinating    GERD (gastroesophageal reflux disease)    Heart murmur    hx   Hyperlipidemia    statin intolerant   Hypertension    Hypothyroidism    PAD (peripheral artery disease) (HCC)    Sleep apnea    "dx'd years ago; had severe problems; all of the sudden it just went away" (07/06/2014) *still gone" (10/30/2016)   Stroke [facility]) ~ 2010   denies residual on 10/30/2016   Type 2 diabetes mellitus (HCC)    Urgency of urination    Uterine cancer (HCC)     Subjective:   I have reviewed medical records including EPIC notes, labs and imaging, assessed the patient and then met with the patient to discuss diagnosis prognosis, GOC, EOL wishes, disposition and options.  I introduced Palliative Medicine as specialized medical care for people living with serious illness. It focuses on providing relief from symptoms and stress of a serious illness. The goal is to improve quality of life for both the patient and the family.  Patient/Family Understanding of Illness: The patient understands her heart failure has  worsened over the last several months.   Social History: Patient has three children. One of her children was murdered in the 1980's. She has several grandchildren and great grandchildren. 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 She has enjoyed socializing with family and friends in her retirement.  Functional and Nutritional State: The patient has a hard time doing her ADL's due to shortness of breath and fatigue. Her family helps her with cooking and she plans to move in with her son after discharge. She reports having a poor appetite but ate over half her lunch today and all of her breakfast.  Today's Discussion: With patient: I shared my concern about her worsening heart failure and decreasing functional status. I shared that her treatment options are limited at this time and I am worried that she will have continued hospitalization, even if she is talking her medications and limiting her fluid intake. She understands, but wants to continue with current treatment. She states that "nobody wants to die," including herself. I shared that many end of life concerns patients have about end of life can be effectively  managed with hospice.   She allowed me to provide education on hospice and what that would look like for her if she decides at any time to move to a comfort based path. She is not ready for this yet. She knows she is going to heaven but will do so "in the lord's time."   With patient's son Davina Poke (20 minutes): We discussed the patient's decreasing functional status and increased weakness over the last few months. Thayer Ohm states they have completed HCPOA documents and he is her proxy. He is trying to make decisions to honor her wishes. He plans to continue managing her HF at home. He states his mom is terrified of hospice. I explained that home hospice would be appropriate now or at any time moving forward if she decides to transition to a comfort focussed path.  Discussed the importance of  continued conversation with family and the medical providers regarding overall plan of care and treatment options, ensuring decisions are within the context of the patient's values and GOCs.  Questions and concerns were addressed. The family was encouraged to call with questions or concerns. PMT will continue to support holistically.  Review of Systems  Constitutional:  Positive for fatigue.  Respiratory:  Positive for shortness of breath.   Cardiovascular:  Positive for leg swelling.    Objective:   Primary Diagnoses: Present on Admission:  Acute on chronic systolic CHF (congestive heart failure) (HCC)  Stage 4 chronic kidney disease (HCC)  Type 2 diabetes mellitus with hyperlipidemia (HCC)  Coronary artery disease due to lipid rich plaque  Cirrhosis of liver without ascites (HCC)   Physical Exam Vitals reviewed.  HENT:     Head: Normocephalic and atraumatic.  Pulmonary:     Effort: Pulmonary effort is normal.  Skin:    General: Skin is warm and dry.  Neurological:     Mental Status: She is alert and oriented to person, place, and time.  Psychiatric:        Mood and Affect: Mood normal.        Behavior: Behavior normal.        Thought Content: Thought content normal.        Judgment: Judgment normal.     Vital Signs:  BP (!) 110/56 (BP Location: Right Arm)   Pulse 83   Temp 98.4 F (36.9 C) (Oral)   Resp 18   Wt 55.3 kg   SpO2 98%   BMI 20.29 kg/m     Advanced Care Planning:   Existing Vynca/ACP Documentation: MOST form  Primary Decision Maker: PATIENT. The patient is the primary decision maker. She would like her son Davina Poke to be her proxy decision maker.  Code Status/Advance Care Planning: DNR   Assessment & Plan:    SUMMARY OF RECOMMENDATIONS   DNR/DNI Continue treating the treatable   Discussed with: Dr. Hanley Ben and bedside RN  Time Total: 80 minutes  Thank you for allowing [place] to participate in the care of LANYAH PARTINGTON PMT will  continue to support holistically.    Signed by: Sarina Ser, NP Palliative Medicine Team  Team Phone # [phone] (Nights/Weekends)  11/14/2022, 1:26 PM

## 2022-11-14 NOTE — Progress Notes (Addendum)
Cardiology Progress Note  Patient ID: Jessica Torres MRN: [id number] DOB: [birth year] Date of Encounter: 11/14/2022  Primary Cardiologist: Dietrich Pates, MD  Subjective   Chief Complaint: SOB  HPI: Good diuresis. Appears euvolemic.   ROS:  All other ROS reviewed and negative. Pertinent positives noted in the HPI.     Inpatient Medications  Scheduled Meds:  aspirin  81 mg Oral Daily   heparin  5,000 Units Subcutaneous Q8H   insulin aspart  0-5 Units Subcutaneous QHS   insulin aspart  0-6 Units Subcutaneous TID WC   insulin glargine-yfgn  17 Units Subcutaneous QHS   levothyroxine  88 mcg Oral QAC breakfast   pantoprazole  40 mg Oral BID   sodium chloride flush  3 mL Intravenous Q12H   torsemide  40 mg Oral Daily   Continuous Infusions:  PRN Meds: acetaminophen **OR** acetaminophen, guaiFENesin, ondansetron **OR** ondansetron (ZOFRAN) IV, senna-docusate   Vital Signs   Vitals:   11/13/22 2002 11/13/22 2354 11/14/22 0406 11/14/22 0741  BP: (!) 115/49 (!) 117/53 122/60 113/60  Pulse: 93 90 87 79  Resp: 19 18 20 18   Temp: 98.3 F (36.8 C) 98.4 F (36.9 C) 98.2 F (36.8 C) 98.4 F (36.9 C)  TempSrc: Oral Oral Oral Oral  SpO2: 98% 95% 97% 97%  Weight:   55.3 kg     Intake/Output Summary (Last 24 hours) at 11/14/2022 0858 Last data filed at 11/14/2022 0500 Gross per 24 hour  Intake 200 ml  Output 750 ml  Net -550 ml      11/14/2022    4:06 AM 11/13/2022    4:10 AM 11/07/2022    5:19 AM  Last 3 Weights  Weight (lbs) 121 lb 14.6 oz 125 lb 126 lb 12.2 oz  Weight (kg) 55.3 kg 56.7 kg 57.5 kg      Telemetry  Overnight telemetry shows SR 70s, which I personally reviewed.    Physical Exam   Vitals:   11/13/22 2002 11/13/22 2354 11/14/22 0406 11/14/22 0741  BP: (!) 115/49 (!) 117/53 122/60 113/60  Pulse: 93 90 87 79  Resp: 19 18 20 18   Temp: 98.3 F (36.8 C) 98.4 F (36.9 C) 98.2 F (36.8 C) 98.4 F (36.9 C)  TempSrc: Oral Oral Oral Oral  SpO2: 98% 95% 97% 97%   Weight:   55.3 kg     Intake/Output Summary (Last 24 hours) at 11/14/2022 0858 Last data filed at 11/14/2022 0500 Gross per 24 hour  Intake 200 ml  Output 750 ml  Net -550 ml       11/14/2022    4:06 AM 11/13/2022    4:10 AM 11/07/2022    5:19 AM  Last 3 Weights  Weight (lbs) 121 lb 14.6 oz 125 lb 126 lb 12.2 oz  Weight (kg) 55.3 kg 56.7 kg 57.5 kg    Body mass index is 20.29 kg/m.  General: Well nourished, well developed, in no acute distress Head: Atraumatic, normal size  Eyes: PEERLA, EOMI  Neck: Supple, no JVD Endocrine: No thryomegaly Cardiac: Normal S1, S2; RRR; no murmurs, rubs, or gallops Lungs: Diminished breath sounds bilaterally Abd: Soft, nontender, no hepatomegaly  Ext: No edema, pulses 2+ Musculoskeletal: No deformities, BUE and BLE strength normal and equal Skin: Warm and dry, no rashes   Neuro: Alert and oriented to person, place, time, and situation, CNII-XII grossly intact, no focal deficits  Psych: Normal mood and affect   Labs  High Sensitivity Troponin:   Recent Labs  Lab 11/12/22 1556 11/12/22 1800  TROPONINIHS 68* 70*     Cardiac EnzymesNo results for input(s): "TROPONINI" in the last 168 hours. No results for input(s): "TROPIPOC" in the last 168 hours.  Chemistry Recent Labs  Lab 11/12/22 1556 11/13/22 0240 11/14/22 0310  NA 131* 130* 135  K 4.2 3.7 3.5  CL 94* 94* 95*  CO2 27 28 28   GLUCOSE 135* 186* 128*  BUN 28* 26* 27*  CREATININE 1.92* 1.97* 2.17*  CALCIUM 9.5 8.9 9.2  GFRNONAA 26* 25* 23*  ANIONGAP 10 8 12     Hematology Recent Labs  Lab 11/12/22 1556 11/13/22 0240 11/14/22 0310  WBC 8.8 7.4 7.0  RBC 2.89* 2.53* 2.53*  HGB 9.2* 8.2* 7.9*  HCT 28.0* 24.5* 24.1*  MCV 96.9 96.8 95.3  MCH 31.8 32.4 31.2  MCHC 32.9 33.5 32.8  RDW 24.0* 24.1* 24.2*  PLT 140* 143* 158   BNP Recent Labs  Lab 11/12/22 1549  BNP 4,449.1*    DDimer No results for input(s): "DDIMER" in the last 168 hours.   Radiology  DG Chest Portable 1  View  Result Date: 11/12/2022 CLINICAL DATA:  Shortness of breath EXAM: PORTABLE CHEST 1 VIEW COMPARISON:  11/03/2022 FINDINGS: Underinflated x-ray. Small pleural effusions. Interstitial changes with some vascular congestion. No pneumothorax. Overlapping cardiac leads. Calcified aorta. IMPRESSION: Underinflation with vascular congestion and interstitial changes. Small pleural effusions. Electronically Signed   By: Karen Kays M.D.   On: 11/12/2022 17:15    Cardiac Studies  TTE 11/01/2022  1. Limited study   2. Left ventricular ejection fraction, by estimation, is 30 to 35%. Left  ventricular ejection fraction by 2D MOD biplane is 32.2 %. The left  ventricle has moderately decreased function. The left ventricle  demonstrates regional wall motion abnormalities  (see scoring diagram/findings for description). diastolic function is  indeterminate due to lack of tissue doppler. There is severe hypokinesis  of the left ventricular, mid-apical inferoseptal wall, septal wall,  lateral wall and apical segment.   3. A small pericardial effusion is present.   4. Moderate to severe mitral valve regurgitation.   5. The aortic root is heaily calcified, but there does not appears to be  significant aortic stenosis. Doppler velocities were not obtained.   6. There is mildly elevated pulmonary artery systolic pressure. The  estimated right ventricular systolic pressure is 39.4 mmHg.   7. The inferior vena cava is dilated in size with <50% respiratory  variability, suggesting right atrial pressure of 15 mmHg.   Patient Profile  82 year old female with history of cirrhosis, systolic heart failure, CAD, GI bleed (GAVE/gastritis), CKD IV, anemia, CVA, poor mobility admitted on 11/12/2022 with acute on chronic systolic heart failure.   Assessment & Plan   # Acute on chronic systolic heart failure, EF 30-35% # Ischemic cardiomyopathy, LAD infarction on nuclear medicine stress test # Cirrhosis # CKD stage IV #  History of GI bleed/chronic anemia -Appears euvolemic today.  Will transition to 40 mg of torsemide daily.  She can take an extra 40 in the afternoon if needed. -See consult note.  Due to comorbidities she is not a candidate for invasive angiography or aggressive cardiovascular procedures.  She has cirrhosis CKD stage IV and advanced cardiomyopathy.  Medical management is the best option for her.  Discussed this with the patient and family yesterday. -Palliative care likely appropriate in hospice. -She is intolerant of GDMT.  All we can offer her is diuretics. -She is ready to go home.  Seems improved with Lasix.  She follows with [facility] cardiology.  Would encourage her to arrange a follow-up appointment.   # CAD, abnormal stress test -Hemoglobin is trending down.  No obvious signs of bleeding.  Would recommend to likely hold aspirin.  She is not on a statin.  Unclear benefit of this either in the setting of advanced cardiomyopathy and significant kidney dysfunction and cirrhosis.  Will defer to her outpatient cardiologist.  [facility] will sign off.   Medication Recommendations:  As above Other recommendations (labs, testing, etc):  none Follow up as an outpatient:  The patient follows with [facility]   For questions or updates, please contact [facility] Please consult [email or web address] for contact info under        Signed, Gerri Spore T. Flora Lipps, MD, [facility]  [facility]  11/14/2022 8:58 AM

## 2022-11-14 NOTE — Progress Notes (Signed)
PROGRESS NOTE    Jessica Torres  UUV:[id number] DOB: 09-27-1940 DOA: 11/12/2022 PCP: Rebekah Chesterfield, NP   Brief Narrative:  82 y.o. 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  On presentation, she appeared to be in volume overload with chest x-ray notable for vascular congestion and small pleural effusions with BNP of 4449 and troponin of 68 and creatinine of 1.92.  She was started on IV Lasix.  Cardiology was consulted.  Assessment & Plan:   Acute on chronic systolic CHF Moderate to severe MR -EF was 30-35% and moderate to severe MR noted on TTE from August 2024  - Jardiance and Coreg discontinued last month d/t worsening renal function and low BP  -Continue IV Lasix.  Strict input and output.  Daily weights.  Fluid restriction.  Cardiology following -Cardiology following: Patient is intolerant of GDMT due to hypotension.  Cardiology recommended to [facility] palliative care and think that patient is hospice appropriate. -Palliative care has been consulted: Follow recommendations.  CAD -Stable.  Continue aspirin.  No anginal symptoms  Diabetes mellitus type 2 with hyperglycemia -A1c 5.4.  Continue long-acting insulin along with CBGs with SSI.  Carb modified diet  CKD stage IV -Creatinine close to baseline.  Monitor  Hypothyroidism--continue levothyroxine  GERD -Continue Protonix  Cirrhosis of liver with history of esophageal varices -Continue diuretics.  Recently taken off beta-blocker due to low blood pressure  Chronic respiratory failure with hypoxia -Respiratory status currently  stable.  Currently on 2 L oxygen by nasal cannula which she wears at home.  Anemia of chronic disease -From chronic illnesses.  Hemoglobin stable.  Monitor intermittently  Thrombocytopenia -Improved  Hyponatremia--possibly from hypervolemia.  Improved.  Monitor  Goals of care -Patient is a DNR.  Overall prognosis is poor.  Palliative care evaluated the patient during recent hospitalization and patient was not interested in palliative care conversations at that point. -I have reconsulted palliative care as per cardiology recommendations.  DVT prophylaxis: Heparin subcutaneous Code Status: DNR Family Communication: None at bedside Disposition Plan: Status is: Inpatient Remains inpatient appropriate because: Of severity of illness    Consultants: Cardiology/palliative care  Procedures: None  Antimicrobials: None   Subjective: Patient seen and examined at bedside.  Still short of breath with exertion with intermittent cough.  No fever, chest pain or vomiting reported. Objective: Vitals:   11/13/22 1600 11/13/22 2002 11/13/22 2354 11/14/22 0406  BP: (!) 111/56 (!) 115/49 (!) 117/53 122/60  Pulse: 85 93 90 87  Resp: 18 19 18 20   Temp: 98 F (36.7 C) 98.3 F (36.8 C) 98.4 F (36.9 C) 98.2 F (36.8 C)  TempSrc: Oral Oral Oral Oral  SpO2: 97% 98% 95% 97%  Weight:    55.3 kg    Intake/Output Summary (Last 24 hours) at 11/14/2022 0802 Last data filed at 11/14/2022 0500 Gross per 24 hour  Intake 200 ml  Output 750 ml  Net -550 ml   Filed Weights   11/13/22 0410 11/14/22 0406  Weight: 56.7 kg 55.3 kg    Examination:  General: On 2 L oxygen via nasal cannula.  No distress.  Elderly female lying in bed. ENT/neck: No thyromegaly.  JVD is not  elevated  respiratory: Decreased breath sounds at bases bilaterally with some crackles; no wheezing  CVS: S1-S2 heard, rate controlled currently Abdominal: Soft, nontender, slightly distended; no organomegaly, bowel sounds are  heard Extremities: Trace lower extremity edema; no cyanosis  CNS: Awake.  Still slow to respond.  No focal neurologic deficit.  Moves extremities Lymph: No obvious lymphadenopathy Skin: No obvious ecchymosis/lesions  psych: Currently not agitated.  Affect is flat. musculoskeletal: No obvious joint swelling/deformity    Data Reviewed: I have personally reviewed following labs and imaging studies  CBC: Recent Labs  Lab 11/12/22 1556 11/13/22 0240 11/14/22 0310  WBC 8.8 7.4 7.0  HGB 9.2* 8.2* 7.9*  HCT 28.0* 24.5* 24.1*  MCV 96.9 96.8 95.3  PLT 140* 143* 158   Basic Metabolic Panel: Recent Labs  Lab 11/12/22 1556 11/13/22 0240 11/14/22 0310  NA 131* 130* 135  K 4.2 3.7 3.5  CL 94* 94* 95*  CO2 27 28 28   GLUCOSE 135* 186* 128*  BUN 28* 26* 27*  CREATININE 1.92* 1.97* 2.17*  CALCIUM 9.5 8.9 9.2  MG  --  2.1 2.3   GFR: Estimated Creatinine Clearance: 18.4 mL/min (A) (by C-G formula based on SCr of 2.17 mg/dL (H)). Liver Function Tests: No results for input(s): "AST", "ALT", "ALKPHOS", "BILITOT", "PROT", "ALBUMIN" in the last 168 hours. No results for input(s): "LIPASE", "AMYLASE" in the last 168 hours. No results for input(s): "AMMONIA" in the last 168 hours. Coagulation Profile: No results for input(s): "INR", "PROTIME" in the last 168 hours. Cardiac Enzymes: No results for input(s): "CKTOTAL", "CKMB", "CKMBINDEX", "TROPONINI" in the last 168 hours. BNP (last 3 results) No results for input(s): "PROBNP" in the last 8760 hours. HbA1C: Recent Labs    11/13/22 0240  HGBA1C 5.4   CBG: Recent Labs  Lab 11/13/22 0547 11/13/22 1129 11/13/22 1559 11/13/22 2124 11/14/22 0522  GLUCAP 137* 122* 130* 149* 100*   Lipid Profile: No results for input(s): "CHOL", "HDL", "LDLCALC", "TRIG", "CHOLHDL", "LDLDIRECT" in the last 72 hours. Thyroid Function Tests: No results for input(s): "TSH", "T4TOTAL", "FREET4", "T3FREE", "THYROIDAB" in the last 72 hours. Anemia  Panel: No results for input(s): "VITAMINB12", "FOLATE", "FERRITIN", "TIBC", "IRON", "RETICCTPCT" in the last 72 hours. Sepsis Labs: No results for input(s): "PROCALCITON", "LATICACIDVEN" in the last 168 hours.  Recent Results (from the past 240 hour(s))  SARS Coronavirus 2 by RT PCR (hospital order, performed in [facility] hospital lab) *cepheid single result test* Anterior Nasal Swab     Status: None   Collection Time: 11/12/22  4:55 PM   Specimen: Anterior Nasal Swab  Result Value Ref Range Status   SARS Coronavirus 2 by RT PCR NEGATIVE NEGATIVE Final    Comment: (NOTE) SARS-CoV-2 target nucleic acids are NOT DETECTED.  The SARS-CoV-2 RNA is generally detectable in upper and lower respiratory specimens during the acute phase of infection. The lowest concentration of SARS-CoV-2 viral copies this assay can detect is 250 copies / mL. A negative result does not preclude SARS-CoV-2 infection and should not be used as the sole basis for treatment or other patient management decisions.  A negative result may occur with improper specimen collection / handling, submission of specimen other than nasopharyngeal swab, presence of viral mutation(s) within the areas targeted by this assay, and inadequate number of viral copies (<250 copies / mL). A negative result must be combined with clinical observations, patient history, and epidemiological information.  Fact Sheet for Patients:   RoadLapTop.co.za  Fact Sheet for Healthcare Providers: http://kim-miller.com/  This test is not yet approved or  cleared by the [place] and has been authorized for detection and/or diagnosis of SARS-CoV-2 by FDA under an Emergency Use Authorization (EUA).  This EUA will remain in effect (meaning this test can be used) for the duration of the COVID-19 declaration under Section 564(b)(1) of the Act, 21 U.S.C. section 360bbb-3(b)(1), unless the authorization  is terminated or revoked sooner.  Performed at [facility], [address] [place], [place] [address]          Radiology Studies: DG Chest Portable 1 View  Result Date: 11/12/2022 CLINICAL DATA:  Shortness of breath EXAM: PORTABLE CHEST 1 VIEW COMPARISON:  11/03/2022 FINDINGS: Underinflated x-ray. Small pleural effusions. Interstitial changes with some vascular congestion. No pneumothorax. Overlapping cardiac leads. Calcified aorta. IMPRESSION: Underinflation with vascular congestion and interstitial changes. Small pleural effusions. Electronically Signed   By: Karen Kays M.D.   On: 11/12/2022 17:15        Scheduled Meds:  aspirin  81 mg Oral Daily   furosemide  40 mg Intravenous Q12H   heparin  5,000 Units Subcutaneous Q8H   insulin aspart  0-5 Units Subcutaneous QHS   insulin aspart  0-6 Units Subcutaneous TID WC   insulin glargine-yfgn  17 Units Subcutaneous QHS   levothyroxine  88 mcg Oral QAC breakfast   pantoprazole  40 mg Oral BID   sodium chloride flush  3 mL Intravenous Q12H   Continuous Infusions:        Glade Lloyd, MD [facility] 11/14/2022, 8:02 AM

## 2022-11-14 NOTE — Evaluation (Signed)
Occupational Therapy Evaluation Patient Details Name: Jessica Torres MRN: [id number] DOB: March 30, 1940 Today's Date: 11/14/2022   History of Present Illness Pt is a 82 y.o. female admitted 8/21 with CHF. She underwent R thoracentesis 8/24. 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  Patient discharging to son's home with assist as needed.  Patient overall needing up to Min A for basic mobility and ADL completion.  OT can follow in the acute setting to address deficits, and HH OT can be considered.         If plan is discharge home, recommend the following: Assist for transportation;Assistance with cooking/housework;A little help with bathing/dressing/bathroom;A little help with walking and/or transfers    Functional Status Assessment  Patient has had a recent decline in their functional status and demonstrates the ability to make significant improvements in function in a reasonable and predictable amount of time.  Equipment Recommendations  None recommended by OT    Recommendations for Other Services       Precautions / Restrictions Precautions Precautions: Fall Precaution Comments: watch sats Restrictions Weight Bearing Restrictions: No      Mobility Bed Mobility Overal bed mobility: Needs Assistance Bed Mobility: Sit to Supine       Sit to supine: Supervision     Patient Response: Cooperative  Transfers Overall transfer level: Needs assistance Equipment used: Rolling walker (2 wheels) Transfers: Sit to/from Stand, Bed to chair/wheelchair/BSC Sit to Stand: Min assist     Step pivot transfers: Contact guard assist            Balance Overall balance assessment: Needs assistance Sitting-balance support: No upper extremity supported, Feet supported Sitting balance-Leahy Scale: Good     Standing balance support:  Reliant on assistive device for balance Standing balance-Leahy Scale: Poor                             ADL either performed or assessed with clinical judgement   ADL Overall ADL's : Needs assistance/impaired     Grooming: Wash/dry hands;Wash/dry face;Sitting;Supervision/safety   Upper Body Bathing: Supervision/ safety;Sitting   Lower Body Bathing: Minimal assistance;Sit to/from stand   Upper Body Dressing : Supervision/safety;Sitting   Lower Body Dressing: Minimal assistance;Sit to/from stand   Toilet Transfer: Minimal assistance;Rolling walker (2 wheels)                   Vision Baseline Vision/History: 1 Wears glasses Patient Visual Report: No change from baseline       Perception Perception: Within Functional Limits       Praxis Praxis: WFL       Pertinent Vitals/Pain Pain Assessment Pain Assessment: No/denies pain Pain Intervention(s): Monitored during session     Extremity/Trunk Assessment Upper Extremity Assessment Upper Extremity Assessment: Generalized weakness   Lower Extremity Assessment Lower Extremity Assessment: Defer to PT evaluation   Cervical / Trunk Assessment Cervical / Trunk Assessment: Kyphotic   Communication Communication Communication: No apparent difficulties   Cognition Arousal: Alert Behavior During Therapy: WFL for tasks assessed/performed Overall Cognitive Status: Within Functional Limits for tasks assessed                                       General Comments  VSS on O2    Exercises     Shoulder Instructions      Home Living Family/patient expects to be discharged to:: Private residence Living Arrangements: Children Available Help at Discharge: Family;Available 24 hours/day Type of Home: House Home Access: Level entry     Home Layout: One level     Bathroom Shower/Tub: Tub/shower unit;Walk-in shower   Bathroom Toilet: Standard Bathroom Accessibility: Yes How Accessible:  Accessible via walker Home Equipment: Rolling Walker (2 wheels)   Additional Comments: active with HH at time of admission      Prior Functioning/Environment Prior Level of Function : Needs assist             Mobility Comments: amb household distances with RW ADLs Comments: Mod I        OT Problem List: Decreased strength;Decreased activity tolerance;Impaired balance (sitting and/or standing)      OT Treatment/Interventions: Self-care/ADL training;Energy conservation;Patient/family education;Balance training;DME and/or AE instruction    OT Goals(Current goals can be found in the care plan section) Acute Rehab OT Goals Patient Stated Goal: Return home OT Goal Formulation: With patient Time For Goal Achievement: 11/28/22 Potential to Achieve Goals: Good ADL Goals Pt Will Perform Grooming: with set-up;sitting Pt Will Perform Lower Body Dressing: with supervision;sit to/from stand Pt Will Transfer to Toilet: with supervision;ambulating;regular height toilet  OT Frequency: Min 1X/week    Co-evaluation              AM-PAC OT "6 Clicks" Daily Activity     Outcome Measure Help from another person eating meals?: None Help from another person taking care of personal grooming?: None Help from another person toileting, which includes using toliet, bedpan, or urinal?: A Little Help from another person bathing (including washing, rinsing, drying)?: A Little Help from another person to put on and taking off regular upper body clothing?: None Help from another person to put on and taking off regular lower body clothing?: A Little 6 Click Score: 21   End of Session Equipment Utilized During Treatment: Gait belt;Rolling walker (2 wheels);Oxygen Nurse Communication: Mobility status  Activity Tolerance: Patient tolerated treatment well Patient left: in bed;with call bell/phone within reach;with family/visitor present  OT Visit Diagnosis: Unsteadiness on feet (R26.81);Muscle  weakness (generalized) (M62.81)                Time: 1610-9604 OT Time Calculation (min): 18 min Charges:  OT General Charges $OT Visit: 1 Visit OT Evaluation $OT Eval Moderate Complexity: 1 Mod  11/14/2022  RP, OTR/L  Acute Rehabilitation Services  Office:  [phone]   Jessica Torres 11/14/2022, 2:05 PM

## 2022-11-14 NOTE — Progress Notes (Signed)
Heart Failure Navigator Progress Note  Assessed for Heart & Vascular TOC clinic readiness.   Patient was recently admitted from 8/21-8/30 for acute on chronic CHF. Ongoing discussions with palliative care if the patient was ready for hospice or not. Ultimately, she was not ready at that time and request was made by primary team to get her scheduled with HF TOC clinic. HF TOC appt was made for 9/9.   Patient was readmitted on 9/4 with volume overload. Intolerant to GDMT due to hypotension. She was diuresed with IV lasix and is deemed stable for discharge today from cardiology perspective. Recommened to continue discussions with palliative care as she is deemed hospice appropriate.   Will keep the appt with our clinic for 9/9 and then likely will continue following up with [facility] cardiology afterward.   Sharen Hones, PharmD, BCPS Heart Failure Stewardship Pharmacist Phone [phone]

## 2022-11-14 NOTE — Evaluation (Signed)
Physical Therapy Evaluation Patient Details Name: Jessica Torres MRN: [id number] DOB: Mar 13, 1940 Today's Date: 11/14/2022  History of Present Illness  Pt is a 82 y.o. female admitted 8/21 with CHF. She underwent R thoracentesis 8/24. 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 Pt to benefit from acute PT to address deficits. Pt ambulated short room distance x2, limited by fatigue and overall requiring light physical assist to steady and guide RW. PT to progress mobility as tolerated, and will continue to follow acutely.          If plan is discharge home, recommend the following: A little help with walking and/or transfers;A little help with bathing/dressing/bathroom;Assistance with cooking/housework;Assist for transportation;Help with stairs or ramp for entrance   Can travel by private vehicle        Equipment Recommendations Other (comment)  Recommendations for Other Services       Functional Status Assessment Patient has had a recent decline in their functional status and demonstrates the ability to make significant improvements in function in a reasonable and predictable amount of time.     Precautions / Restrictions Precautions Precautions: Fall Precaution Comments: watch sats Restrictions Weight Bearing Restrictions: No      Mobility  Bed Mobility Overal bed mobility: Needs Assistance Bed Mobility: Supine to Sit     Supine to sit: Min assist, HOB elevated     General bed mobility comments: assist for trunk rise via HHA    Transfers Overall transfer level: Needs assistance Equipment used: Rolling walker (2 wheels) Transfers: Sit to/from Stand Sit to Stand: Min assist           General transfer  comment: light rise and steady assist, stand from EOB and toilet    Ambulation/Gait Ambulation/Gait assistance: Min assist Gait Distance (Feet): 15 Feet (x2) Assistive device: Rolling walker (2 wheels) Gait Pattern/deviations: Step-through pattern, Decreased stride length, Trunk flexed Gait velocity: decr     General Gait Details: assist to steady and manage RW, cues for proximity of RW and upright posture. SPO2 drop to 85% on 2LO2, increased O2 to 3LO2 with recovery to 98% in combination with rest and breathing technique  Stairs            Wheelchair Mobility     Tilt Bed    Modified Rankin (Stroke Patients Only)       Balance Overall balance assessment: Needs assistance Sitting-balance support: No upper extremity supported, Feet supported Sitting balance-Leahy Scale: Fair     Standing balance support: Bilateral upper extremity supported, During functional activity Standing balance-Leahy Scale: Poor Standing balance comment: reliant on at least SL support in front of sink, standing tolerance x3 minutes during ADLs                             Pertinent Vitals/Pain Pain Assessment Pain Assessment: No/denies pain Pain Intervention(s): Monitored during session    Home Living Family/patient expects to be discharged to:: Private residence Living Arrangements: Children Available Help at Discharge: Family;Available 24 hours/day Type of Home: House Home Access: Level entry       Home Layout: One level Home Equipment: Agricultural consultant (2 wheels) Additional Comments: active with HH at time of admission  Prior Function Prior Level of Function : Needs assist             Mobility Comments: amb household distances with RW       Extremity/Trunk Assessment   Upper Extremity Assessment Upper Extremity Assessment: Defer to OT evaluation    Lower Extremity Assessment Lower Extremity Assessment: Generalized weakness    Cervical / Trunk  Assessment Cervical / Trunk Assessment: Kyphotic  Communication   Communication Communication: No apparent difficulties  Cognition Arousal: Alert Behavior During Therapy: WFL for tasks assessed/performed Overall Cognitive Status: Within Functional Limits for tasks assessed                                          General Comments      Exercises     Assessment/Plan    PT Assessment Patient needs continued PT services  PT Problem List Decreased strength;Decreased balance;Decreased mobility;Decreased knowledge of use of DME;Decreased activity tolerance;Cardiopulmonary status limiting activity       PT Treatment Interventions DME instruction;Functional mobility training;Balance training;Patient/family education;Gait training;Therapeutic activities;Therapeutic exercise;Stair training    PT Goals (Current goals can be found in the Care Plan section)  Acute Rehab PT Goals Patient Stated Goal: home PT Goal Formulation: With patient Time For Goal Achievement: 11/28/22 Potential to Achieve Goals: Fair    Frequency Min 1X/week     Co-evaluation               AM-PAC PT "6 Clicks" Mobility  Outcome Measure Help needed turning from your back to your side while in a flat bed without using bedrails?: A Little Help needed moving from lying on your back to sitting on the side of a flat bed without using bedrails?: A Little Help needed moving to and from a bed to a chair (including a wheelchair)?: A Little Help needed standing up from a chair using your arms (e.g., wheelchair or bedside chair)?: A Little Help needed to walk in hospital room?: A Little Help needed climbing 3-5 steps with a railing? : A Lot 6 Click Score: 17    End of Session Equipment Utilized During Treatment: Oxygen;Gait belt Activity Tolerance: Patient tolerated treatment well Patient left: in chair;with call bell/phone within reach;with family/visitor present;with chair alarm set Nurse  Communication: Mobility status PT Visit Diagnosis: Difficulty in walking, not elsewhere classified (R26.2)    Time: 9811-9147 PT Time Calculation (min) (ACUTE ONLY): 38 min   Charges:   PT Evaluation $PT Eval Low Complexity: 1 Low PT Treatments $Gait Training: 8-22 mins $Therapeutic Activity: 8-22 mins PT General Charges $$ ACUTE PT VISIT: 1 Visit         Marye Round, PT DPT Acute Rehabilitation Services Secure Chat Preferred  Office [phone]   Annaliza Zia E Christain [place] 11/14/2022, 11:57 AM

## 2022-11-15 DIAGNOSIS — Z515 Encounter for palliative care: Secondary | ICD-10-CM | POA: Diagnosis not present

## 2022-11-15 DIAGNOSIS — Z7189 Other specified counseling: Secondary | ICD-10-CM | POA: Diagnosis not present

## 2022-11-15 DIAGNOSIS — I5023 Acute on chronic systolic (congestive) heart failure: Secondary | ICD-10-CM | POA: Diagnosis not present

## 2022-11-15 LAB — CBC
HCT: 26.3 % — ABNORMAL LOW (ref 36.0–46.0)
Hemoglobin: 8.6 g/dL — ABNORMAL LOW (ref 12.0–15.0)
MCH: 31.4 pg (ref 26.0–34.0)
MCHC: 32.7 g/dL (ref 30.0–36.0)
MCV: 96 fL (ref 80.0–100.0)
Platelets: 164 10*3/uL (ref 150–400)
RBC: 2.74 MIL/uL — ABNORMAL LOW (ref 3.87–5.11)
RDW: 24.3 % — ABNORMAL HIGH (ref 11.5–15.5)
WBC: 7.2 10*3/uL (ref 4.0–10.5)
nRBC: 0 % (ref 0.0–0.2)

## 2022-11-15 LAB — GLUCOSE, CAPILLARY
Glucose-Capillary: 77 mg/dL (ref 70–99)
Glucose-Capillary: 103 mg/dL — ABNORMAL HIGH (ref 70–99)
Glucose-Capillary: 116 mg/dL — ABNORMAL HIGH (ref 70–99)

## 2022-11-15 LAB — BASIC METABOLIC PANEL
Anion gap: 7 (ref 5–15)
BUN: 24 mg/dL — ABNORMAL HIGH (ref 8–23)
CO2: 28 mmol/L (ref 22–32)
Calcium: 8.6 mg/dL — ABNORMAL LOW (ref 8.9–10.3)
Chloride: 95 mmol/L — ABNORMAL LOW (ref 98–111)
Creatinine, Ser: 2.14 mg/dL — ABNORMAL HIGH (ref 0.44–1.00)
GFR, Estimated: 23 mL/min — ABNORMAL LOW (ref >60)
Glucose, Bld: 103 mg/dL — ABNORMAL HIGH (ref 70–99)
Potassium: 3.4 mmol/L — ABNORMAL LOW (ref 3.5–5.1)
Sodium: 130 mmol/L — ABNORMAL LOW (ref 135–145)

## 2022-11-15 LAB — MAGNESIUM: Magnesium: 2.3 mg/dL (ref 1.7–2.4)

## 2022-11-15 MED ORDER — POTASSIUM CHLORIDE CRYS ER 20 MEQ PO TBCR: 20.0000 meq | EXTENDED_RELEASE_TABLET | Freq: Every day | ORAL | 0 refills | Status: DC

## 2022-11-15 MED ORDER — TORSEMIDE 20 MG PO TABS: 20.0000 mg | ORAL_TABLET | Freq: Every day | ORAL | Status: DC

## 2022-11-15 MED ORDER — POTASSIUM CHLORIDE CRYS ER 20 MEQ PO TBCR
40.0000 meq | EXTENDED_RELEASE_TABLET | Freq: Once | ORAL | Status: AC
  Administered 2022-11-15: 40 meq via ORAL
  Filled 2022-11-15: qty 2

## 2022-11-15 MED ORDER — TORSEMIDE 20 MG PO TABS: 20.0000 mg | ORAL_TABLET | Freq: Every day | ORAL | 0 refills | Status: DC

## 2022-11-15 MED ORDER — PANTOPRAZOLE SODIUM 40 MG PO TBEC: 40.0000 mg | DELAYED_RELEASE_TABLET | Freq: Every day | ORAL | Status: DC

## 2022-11-15 MED ORDER — TORSEMIDE 40 MG PO TABS: 40.0000 mg | ORAL_TABLET | Freq: Every day | ORAL | 0 refills | Status: DC

## 2022-11-15 MED ORDER — TOUJEO MAX SOLOSTAR 300 UNIT/ML ~~LOC~~ SOPN
15.0000 [IU] | PEN_INJECTOR | Freq: Every day | SUBCUTANEOUS | Status: DC
Start: 2022-11-15 — End: 2023-01-21

## 2022-11-15 NOTE — Progress Notes (Signed)
Mobility Specialist Progress Note    11/15/22 1008  Mobility  Activity Ambulated with assistance in room  Level of Assistance Minimal assist, patient does 75% or more  Assistive Device Front wheel walker  Distance Ambulated (ft) 60 ft  Activity Response Tolerated well  Mobility Referral Yes  $Mobility charge 1 Mobility  Mobility Specialist Start Time (ACUTE ONLY) [id number]  Mobility Specialist Stop Time (ACUTE ONLY) 1007  Mobility Specialist Time Calculation (min) (ACUTE ONLY) 15 min   Pre-Mobility: 89 HR Post-Mobility: 97 HR  Pt received in bed and agreeable. C/o legs feeling stiff. Returned to bathroom to attempt BM. Encouraged to pull string when ready to get up. RN notified.   [place] Mobility Specialist  Please Neurosurgeon or Rehab Office at [phone]

## 2022-11-15 NOTE — Progress Notes (Signed)
Progress Note  Patient Name: Jessica Torres Date of Encounter: 11/15/2022  Primary Cardiologist:   Dietrich Pates, MD   Subjective   No pain.  Ready to go home.    Inpatient Medications    Scheduled Meds:  aspirin  81 mg Oral Daily   heparin  5,000 Units Subcutaneous Q8H   insulin aspart  0-5 Units Subcutaneous QHS   insulin aspart  0-6 Units Subcutaneous TID WC   insulin glargine-yfgn  17 Units Subcutaneous QHS   levothyroxine  88 mcg Oral QAC breakfast   pantoprazole  40 mg Oral BID   potassium chloride  40 mEq Oral Once   sodium chloride flush  3 mL Intravenous Q12H   torsemide  40 mg Oral Daily   Continuous Infusions:  PRN Meds: acetaminophen **OR** acetaminophen, guaiFENesin, ondansetron **OR** ondansetron (ZOFRAN) IV, senna-docusate   Vital Signs    Vitals:   11/14/22 2001 11/14/22 2359 11/15/22 0451 11/15/22 0739  BP: (!) 108/49 (!) 107/48 (!) 121/59 (!) 117/57  Pulse: 92 90 93   Resp: 19 19 20    Temp: 98 F (36.7 C) 97.9 F (36.6 C) 98.5 F (36.9 C) 98.4 F (36.9 C)  TempSrc: Oral Oral Oral Oral  SpO2: 99% 95% 98% 93%  Weight:   55.4 kg     Intake/Output Summary (Last 24 hours) at 11/15/2022 0830 Last data filed at 11/15/2022 0500 Gross per 24 hour  Intake --  Output 1270 ml  Net -1270 ml   Filed Weights   11/13/22 0410 11/14/22 0406 11/15/22 0451  Weight: 56.7 kg 55.3 kg 55.4 kg    Telemetry    NSR - Personally Reviewed  ECG     - Personally Reviewed  Physical Exam   GEN: No acute distress.   Neck: No  JVD Cardiac: RRR, no murmurs, rubs, or gallops.  Respiratory: Clear  to auscultation bilaterally. GI: Soft, nontender, non-distended  MS: No  edema; No deformity. Neuro:  Nonfocal  Psych: Normal affect   Labs    Chemistry Recent Labs  Lab 11/13/22 0240 11/14/22 0310 11/15/22 0256  NA 130* 135 130*  K 3.7 3.5 3.4*  CL 94* 95* 95*  CO2 28 28 28   GLUCOSE 186* 128* 103*  BUN 26* 27* 24*  CREATININE 1.97* 2.17* 2.14*  CALCIUM  8.9 9.2 8.6*  GFRNONAA 25* 23* 23*  ANIONGAP 8 12 7      Hematology Recent Labs  Lab 11/13/22 0240 11/14/22 0310 11/15/22 0256  WBC 7.4 7.0 7.2  RBC 2.53* 2.53* 2.74*  HGB 8.2* 7.9* 8.6*  HCT 24.5* 24.1* 26.3*  MCV 96.8 95.3 96.0  MCH 32.4 31.2 31.4  MCHC 33.5 32.8 32.7  RDW 24.1* 24.2* 24.3*  PLT 143* 158 164    Cardiac EnzymesNo results for input(s): "TROPONINI" in the last 168 hours. No results for input(s): "TROPIPOC" in the last 168 hours.   BNP Recent Labs  Lab 11/12/22 1549  BNP 4,449.1*     DDimer No results for input(s): "DDIMER" in the last 168 hours.   Radiology    No results found.  Cardiac Studies   Echo:  TTE 11/01/2022  1. Limited study   2. Left ventricular ejection fraction, by estimation, is 30 to 35%. Left  ventricular ejection fraction by 2D MOD biplane is 32.2 %. The left  ventricle has moderately decreased function. The left ventricle  demonstrates regional wall motion abnormalities  (see scoring diagram/findings for description). diastolic function is  indeterminate due to  lack of tissue doppler. There is severe hypokinesis  of the left ventricular, mid-apical inferoseptal wall, septal wall,  lateral wall and apical segment.   3. A small pericardial effusion is present.   4. Moderate to severe mitral valve regurgitation.   5. The aortic root is heaily calcified, but there does not appears to be  significant aortic stenosis. Doppler velocities were not obtained.   6. There is mildly elevated pulmonary artery systolic pressure. The  estimated right ventricular systolic pressure is 39.4 mmHg.   7. The inferior vena cava is dilated in size with <50% respiratory  variability, suggesting right atrial pressure of 15 mmHg.   Patient Profile     82 y.o. female with history of cirrhosis, systolic heart failure, CAD, GI bleed (GAVE/gastritis), CKD IV, anemia, CVA, poor mobility admitted on 11/12/2022 with acute on chronic systolic heart failure.    Assessment & Plan    Acute on chronic systolic heart failure, EF 30-35%:   Transitioned to PO diuretic yesterday.    Creat did bump slightly but she will need some dose of PO diuretic likely at home.       Ischemic cardiomyopathy/CAD:  Medical management.    CKD stage IV:  Other comorbid conditions to include anemia, cirrhosis.  Palliative care consulted and appreciated with hospice discussion noted.    See med discussion above.   Disposition:  We will sign off.  She has cardiologists at [facility] who can follow up with her. She has follow up with [place] for later this month.      Signed, Rollene Rotunda, MD  11/15/2022, 8:30 AM

## 2022-11-15 NOTE — TOC Transition Note (Signed)
Transition of Care [facility]) - CM/SW Discharge Note   Patient Details  Name: Jessica Torres MRN: [id number] Date of Birth: [birth year]  [facility] CM/SW Contact:  Lawerance Sabal, RN Phone Number: 11/15/2022, 10:55 AM   Clinical Narrative:     Per chart review patient is active with [facility], I have notified week liaison that patient will DC today. Per MD notes patient has oxygen at home at 2L.  No DME needs identified at this time.  No other TOC needs identified.   Final next level of care: Home w Home Health Services Barriers to Discharge: No Barriers Identified   Patient Goals and CMS Choice      Discharge Placement                         Discharge Plan and Services Additional resources added to the After Visit Summary for       Post Acute Care Choice: Home Health                      [facility] Agency: [facility] Date [facility] Agency Contacted: 11/15/22 Time HH Agency Contacted: 1055 Representative spoke with at [facility] Agency: Laurelyn Sickle  Social Determinants of Health (SDOH) Interventions SDOH Screenings   Food Insecurity: No Food Insecurity (10/29/2022)  Housing: Low Risk  (10/30/2022)  Transportation Needs: No Transportation Needs (10/30/2022)  Utilities: Not At Risk (10/29/2022)  Alcohol Screen: Low Risk  (10/30/2022)  Depression (PHQ2-9): Medium Risk (02/21/2022)  Financial Resource Strain: Low Risk  (10/30/2022)  Physical Activity: Inactive (09/25/2020)  Social Connections: Unknown (08/27/2022)   Received from [facility]  Stress: No Stress Concern Present (10/12/2022)   Received from [facility]  Tobacco Use: Low Risk  (11/12/2022)     Readmission Risk Interventions    10/31/2022    5:12 PM  Readmission Risk Prevention Plan  Transportation Screening Complete  HRI or Home Care Consult Complete  Palliative Care Screening Not Applicable  Medication Review (RN Care Manager) Complete

## 2022-11-15 NOTE — Progress Notes (Signed)
Daily Progress Note   Patient Name: Jessica Torres       Date: 11/15/2022 DOB: [birth year]  Age: 82 y.o. MRN#: [id number] Attending Physician: Glade Lloyd, MD Primary Care Physician: Rebekah Chesterfield, NP Admit Date: 11/12/2022  Reason for Consultation/Follow-up: Establishing goals of care  Subjective: Patient is sitting in bed. Reports she is "ready to get out of here."  Length of Stay: 3  Current Medications: Scheduled Meds:   aspirin  81 mg Oral Daily   heparin  5,000 Units Subcutaneous Q8H   insulin aspart  0-5 Units Subcutaneous QHS   insulin aspart  0-6 Units Subcutaneous TID WC   insulin glargine-yfgn  17 Units Subcutaneous QHS   levothyroxine  88 mcg Oral QAC breakfast   pantoprazole  40 mg Oral BID   sodium chloride flush  3 mL Intravenous Q12H   [START ON 11/16/2022] torsemide  20 mg Oral Daily    Continuous Infusions:   PRN Meds: acetaminophen **OR** acetaminophen, guaiFENesin, ondansetron **OR** ondansetron (ZOFRAN) IV, senna-docusate  Physical Exam Vitals reviewed.  HENT:     Head: Normocephalic and atraumatic.  Cardiovascular:     Rate and Rhythm: Normal rate.  Pulmonary:     Effort: Pulmonary effort is normal.  Skin:    General: Skin is warm and dry.  Neurological:     Mental Status: She is alert and oriented to person, place, and time.  Psychiatric:        Mood and Affect: Mood normal.        Behavior: Behavior normal.        Thought Content: Thought content normal.        Judgment: Judgment normal.             Vital Signs: BP (!) 120/52 (BP Location: Left Arm)   Pulse 88   Temp 98.3 F (36.8 C) (Oral)   Resp 20   Wt 55.4 kg   SpO2 96%   BMI 20.32 kg/m  SpO2: SpO2: 96 % O2 Device: O2 Device: Nasal Cannula O2 Flow Rate: O2 Flow Rate  (L/min): 2 L/min     Patient Active Problem List   Diagnosis Date Noted   Acute on chronic systolic CHF (congestive heart failure) (HCC) 11/12/2022   Palliative care encounter 11/01/2022   Pleural effusion 10/31/2022   Hyponatremia  10/30/2022   Acute on chronic combined systolic and diastolic CHF (congestive heart failure) (HCC) 10/29/2022   Acute kidney injury on chronic kidney disease stage IV (HCC) 10/29/2022   Anemia 07/15/2022   Melena 07/15/2022   Cirrhosis of liver without ascites (HCC) 07/13/2022   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   Gastritis and gastroduodenitis 04/28/2022   NSTEMI (non-ST elevated myocardial infarction) (HCC) 04/27/2022   Gastrointestinal hemorrhage 04/27/2022   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 disease 04/27/2022   Heart failure with preserved ejection fraction (HCC) 04/27/2022   Chronic cough 04/27/2022   History of CVA (cerebrovascular accident) 04/27/2022   Osteopenia 11/28/2020   Iron deficiency 10/11/2020   Stage 4 chronic kidney disease (HCC) 05/25/2020   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   Chronic bilateral low back pain without sciatica 07/13/2019   Carotid stenosis, right 12/10/2017   Occlusion and stenosis of right carotid artery 12/10/2017   Left hip pain 06/04/2017   Claudication in peripheral vascular disease (HCC) 10/30/2016   Vitamin D deficiency 05/07/2016   Gastroesophageal reflux disease without esophagitis 01/28/2016   Coronary artery disease due to lipid rich plaque 10/18/2015   Hypothyroidism 10/18/2015   S/P -Billat SFA PTA with restenosis  07/06/2014   Peripheral vascular angioplasty status 07/06/2014   Hypertension associated with diabetes (HCC) 06/28/2014   Hyperlipidemia-statin intolerant 06/28/2014   Type 2 diabetes mellitus with hyperlipidemia (HCC) 06/28/2014   Peripheral arterial disease - Bilateral SFA  06/28/2014    Palliative Care Assessment & Plan   Patient Profile: 82 y.o. 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     Assessment: Patient is sitting in bed. She reports standing at the sink for several minutes while washing her hands and brushing her teeth then getting herself to bed. She admits this was difficult but she is glad she could do this herself.   She has no questions or concerns from our meeting yesterday. Plan is for patient to discharge to her son's house today.  Recommendations/Plan: DNR/DNI Continue treating the treatable Continued PMT support   Code Status:    Code Status Orders  (From admission, onward)           Start     Ordered   11/12/22 2029  Do not attempt resuscitation (DNR)- Limited -Do Not Intubate (DNI)  Continuous       Question Answer Comment  If pulseless and not breathing No CPR or chest compressions.   In Pre-Arrest Conditions (Patient Is Breathing and Has A Pulse) Do not intubate. Provide all appropriate non-invasive medical interventions. Avoid ICU transfer unless indicated or required.   Consent: Discussion documented in EHR or advanced directives reviewed      11/12/22 2029         Extensive chart review has been completed prior to meeting with patient including labs, vital signs, imaging, progress/consult notes, orders, medications and available advance directive documents.  Time spent: 25 minutes  Thank you for allowing the Palliative Medicine Team to assist in the care of this patient.   Sherryll Burger, NP  Please contact Palliative Medicine Team phone at [phone] for questions and concerns.

## 2022-11-15 NOTE — Discharge Summary (Signed)
Physician Discharge Summary  Jessica Torres ZOX:[id number] DOB: [birth year] DOA: 11/12/2022  PCP: Rebekah Chesterfield, NP  Admit date: 11/12/2022 Discharge date: 11/15/2022  Admitted From: Home Disposition: Home  Recommendations for Outpatient Follow-up:  Follow up with PCP in 1 week with repeat CBC/BMP Outpatient follow-up with cardiology and palliative care Follow up in ED if symptoms worsen or new appear   Home Health: Home health PT/OT/RN Equipment/Devices: None  Discharge Condition: Guarded to poor  CODE STATUS: DNR Diet recommendation: Heart healthy/fluid restriction of up to 1200 cc a day.  Brief/Interim Summary: 82 y.o. 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  On presentation, she appeared to be in volume overload with chest x-ray notable for vascular congestion and small pleural effusions with BNP of 4449 and troponin of 68 and creatinine of 1.92.  She was started on IV Lasix.  Cardiology was consulted.  Cardiology recommended palliative care reinvolvement for consideration of possible hospice.  Palliative care evaluated the patient and patient denied hospice.  Cardiology has transitioned her to oral torsemide and cleared her for discharge.  Patient feels okay to go home today.  Discharge patient home today with outpatient follow-up with PCP, cardiology and palliative care.  Discharge Diagnoses:   Acute on chronic systolic CHF Moderate to severe MR -EF was 30-35% and moderate to severe MR noted on TTE from August 2024  - Jardiance and Coreg discontinued last month d/t worsening renal  function and low BP  -Treated with IV Lasix and patient has diuresed well during this hospitalization. -Cardiology following: Patient is intolerant of GDMT due to hypotension.  Cardiology recommended to [facility] palliative care and think that patient is hospice appropriate.  See goals of care discussion below. -Cardiology has switched her to oral torsemide and cleared her for discharge.  Patient was to go home today.  Discharge patient home today on oral torsemide 20 mg daily.  Outpatient follow-up with cardiology  CAD -Stable.  Continue aspirin.  No anginal symptoms   Diabetes mellitus type 2 with hyperglycemia -A1c 5.4.  Carb modified diet.  Continue long-acting insulin.   CKD stage IV -Creatinine close to baseline.  Monitor intermittently as an outpatient.   Hypothyroidism--continue levothyroxine   GERD -Continue Protonix   Cirrhosis of liver with history of esophageal varices -Continue diuretics.  Recently taken off beta-blocker due to low blood pressure   Chronic respiratory failure with hypoxia -Respiratory status currently stable.  Currently on 2 L oxygen by nasal cannula which she wears at home.   Anemia of chronic disease -From chronic illnesses.  Hemoglobin stable.  Monitor intermittently   Thrombocytopenia -Improved   Hyponatremia--possibly from hypervolemia.  Mild.  Outpatient follow-up  Hypokalemia -Replaced.  Continue supplementation as an outpatient.  Outpatient follow-up.    Goals of care -Patient is a DNR.  Overall prognosis is poor.   -Cardiology recommended palliative care reinvolvement for consideration of possible hospice.  Palliative care evaluated the patient and patient denied hospice.  -Outpatient follow-up with palliative care   Discharge Instructions  Discharge Instructions     Amb Referral to Palliative Care   Complete by: As directed    Ambulatory referral to Cardiology   Complete by:  As directed    Diet - low sodium heart healthy    Complete by: As directed    Increase activity slowly   Complete by: As directed       Allergies as of 11/15/2022       Reactions   Morphine And Codeine Swelling, Other (See Comments)   Facial swelling   Latex Rash   Statins Other (See Comments)   Myalgias        Medication List     STOP taking these medications    empagliflozin 25 MG Tabs tablet Commonly known as: JARDIANCE   furosemide 20 MG tablet Commonly known as: LASIX   insulin lispro 100 UNIT/ML KwikPen Commonly known as: HUMALOG       TAKE these medications    Accu-Chek Softclix Lancets lancets Test BS TID Dx E11.9   aspirin 81 MG chewable tablet Chew 1 tablet (81 mg total) by mouth daily.   BD Pen Needle Nano 2nd Gen 32G X 4 MM Misc Generic drug: Insulin Pen Needle Use twice daily with insulin Dx E11.9   dextromethorphan 15 MG/5ML syrup Take 10 mLs by mouth 4 (four) times daily as needed for cough.   ezetimibe 10 MG tablet Commonly known as: ZETIA Take 1 tablet (10 mg total) by mouth daily. What changed: when to take this   fenofibrate 160 MG tablet TAKE 1 TABLET DAILY   FREESTYLE LITE test strip Generic drug: glucose blood Test BS TID Dx E11.9   levothyroxine 88 MCG tablet Commonly known as: SYNTHROID TAKE 1 TABLET DAILY BEFORE BREAKFAST   ondansetron 4 MG tablet Commonly known as: Zofran Take 1 tablet (4 mg total) by mouth every 8 (eight) hours as needed for nausea or vomiting.   pantoprazole 40 MG tablet Commonly known as: PROTONIX Take 1 tablet (40 mg total) by mouth daily. Follow up with gastroenterology for additional recommendations   potassium chloride SA 20 MEQ tablet Commonly known as: KLOR-CON M Take 1 tablet (20 mEq total) by mouth daily.   torsemide 20 MG tablet Commonly known as: DEMADEX Take 1 tablet (20 mg total) by mouth daily. Start taking on: November 16, 2022   Toujeo Max SoloStar 300 UNIT/ML Solostar Pen Generic drug: insulin glargine (2 Unit  Dial) Inject 15 Units into the skin at bedtime. What changed: how much to take        Follow-up Information     Ellsworth Lennox, PA-C Follow up.   Specialties: Cardiology, Cardiology Why: Hospital follow-up with Cardiology scheduled for 12/03/2022 at 2:30pm. Please arrive 15 minutes early for check-in. If this date/ time does not work for you, please call our office to reschedule. Contact information: [address] [place] [address] [phone]         Rebekah Chesterfield, NP. Schedule an appointment as soon as possible for a visit in 1 week(s).   Specialty: Internal Medicine Why: with repeat cbc/bmp Contact information: [address] [place] [address] [place] [address] [phone]                Allergies  Allergen Reactions   Morphine And Codeine Swelling and Other (See Comments)    Facial swelling   Latex Rash   Statins Other (See Comments)    Myalgias     Consultations: Cardiology/palliative care   Procedures/Studies: DG Chest Portable 1 View  Result Date: 11/12/2022 CLINICAL DATA:  Shortness of breath EXAM: PORTABLE CHEST 1 VIEW COMPARISON:  11/03/2022 FINDINGS: Underinflated x-ray. Small pleural  effusions. Interstitial changes with some vascular congestion. No pneumothorax. Overlapping cardiac leads. Calcified aorta. IMPRESSION: Underinflation with vascular congestion and interstitial changes. Small pleural effusions. Electronically Signed   By: Karen Kays M.D.   On: 11/12/2022 17:15   [place] CHEST (PLEURAL EFFUSION)  Result Date: 11/04/2022 CLINICAL DATA:  Pleural effusions EXAM: CHEST ULTRASOUND COMPARISON:  Chest x-ray 11/03/2022 FINDINGS: Bilateral pleural effusions are seen, moderate left and small right. IMPRESSION: Bilateral pleural effusions.  Moderate left and small right. Electronically Signed   By: Karen Kays M.D.   On: 11/04/2022 19:48   DG Chest Left Decubitus  Result Date: 11/03/2022 CLINICAL DATA:  Left pleural effusion. EXAM: CHEST - LEFT  DECUBITUS COMPARISON:  November 01, 2022. FINDINGS: Small left pleural effusion noted on prior radiograph is not significantly changed suggesting possible loculation. Left basilar opacity is noted concerning for atelectasis or infiltrate. Probable small loculated right pleural effusion is noted with associated opacity. IMPRESSION: See above. Electronically Signed   By: Lupita Raider M.D.   On: 11/03/2022 17:46   ECHOCARDIOGRAM LIMITED  Result Date: 11/01/2022    ECHOCARDIOGRAM LIMITED REPORT   Patient Name:   TALULLA KIRSTEIN Date of Exam: 11/01/2022 Medical Rec #:  [id number]      Height:       65.0 in Accession #:    [id number]     Weight:       133.6 lb Date of Birth:  12/17/1940      BSA:          1.666 m Patient Age:    33 years       BP:           111/51 mmHg Patient Gender: F              HR:           79 bpm. Exam Location:  Inpatient Procedure: Limited Echo, Cardiac Doppler and Limited Color Doppler Indications:    CHF-Acute Systolic I50.21  History:        Patient has prior history of Echocardiogram examinations, most                 recent 07/14/2022. CAD and Previous Myocardial Infarction, PAD,                 Cirrhosis, Carotid Disease and Stroke, Signs/Symptoms:Murmur;                 Risk Factors:Dyslipidemia, Diabetes, Hypertension and                 Non-Smoker.  Sonographer:    Aron Baba Referring Phys: Little Ishikawa IMPRESSIONS  1. Limited study  2. Left ventricular ejection fraction, by estimation, is 30 to 35%. Left ventricular ejection fraction by 2D MOD biplane is 32.2 %. The left ventricle has moderately decreased function. The left ventricle demonstrates regional wall motion abnormalities (see scoring diagram/findings for description). diastolic function is indeterminate due to lack of tissue doppler. There is severe hypokinesis of the left ventricular, mid-apical inferoseptal wall, septal wall, lateral wall and apical segment.  3. A small pericardial effusion is present.  4.  Moderate to severe mitral valve regurgitation.  5. The aortic root is heaily calcified, but there does not appears to be significant aortic stenosis. Doppler velocities were not obtained.  6. There is mildly elevated pulmonary artery systolic pressure. The estimated right ventricular systolic pressure is 39.4 mmHg.  7. The inferior vena cava is dilated in size with <  50% respiratory variability, suggesting right atrial pressure of 15 mmHg. Comparison(s): Changes from prior study are noted. 07/14/2022: LVEF 40-45%, mid/distal lateral, mid/distal septal, distal inferior and apical walls. FINDINGS  Left Ventricle: Left ventricular ejection fraction, by estimation, is 30 to 35%. Left ventricular ejection fraction by 2D MOD biplane is 32.2 %. The left ventricle has moderately decreased function. The left ventricle demonstrates regional wall motion abnormalities. Severe hypokinesis of the left ventricular, mid-apical inferoseptal wall, septal wall, lateral wall and apical segment. The left ventricular internal cavity size was normal in size. There is no left ventricular hypertrophy. Diastolic function is indeterminate due to lack of tissue doppler. Right Ventricle: There is mildly elevated pulmonary artery systolic pressure. The tricuspid regurgitant velocity is 2.47 m/s, and with an assumed right atrial pressure of 15 mmHg, the estimated right ventricular systolic pressure is 39.4 mmHg. Pericardium: A small pericardial effusion is present. Mitral Valve: Moderate to severe mitral valve regurgitation, with centrally-directed jet. Tricuspid Valve: The tricuspid valve is grossly normal. Tricuspid valve regurgitation is mild. Aortic Valve: The aortic root is heaily calcified, but there does not appears to be significant aortic stenosis. Doppler velocities were not obtained. Venous: The inferior vena cava is dilated in size with less than 50% respiratory variability, suggesting right atrial pressure of 15 mmHg. Additional  Comments: There is a small pleural effusion in the left lateral region. Spectral Doppler performed. Color Doppler performed.  LEFT VENTRICLE PLAX 2D                        Biplane EF (MOD) LVIDd:         4.70 cm         LV Biplane EF:   Left LVIDs:         3.90 cm                          ventricular LV PW:         0.80 cm                          ejection LV IVS:        0.60 cm                          fraction by                                                 2D MOD                                                 biplane is LV Volumes (MOD)                                32.2 %. LV vol d, MOD    134.0 ml A2C: LV vol d, MOD    108.0 ml A4C: LV vol s, MOD    83.3 ml A2C: LV vol s, MOD    76.3 ml A4C: LV SV MOD A2C:   50.7 ml LV SV MOD A4C:  108.0 ml LV SV MOD BP:    38.7 ml RIGHT VENTRICLE TAPSE (M-mode): 1.6 cm LEFT ATRIUM         Index LA diam:    3.50 cm 2.10 cm/m  MITRAL VALVE                TRICUSPID VALVE MV Area (PHT): 4.31 cm     TR Peak grad:   24.4 mmHg MV Decel Time: 176 msec     TR Vmax:        247.00 cm/s MR Peak grad: 65.6 mmHg MR Vmax:      405.00 cm/s MV E velocity: 164.00 cm/s MV A velocity: 73.10 cm/s MV E/A ratio:  2.24 Zoila Shutter MD Electronically signed by Zoila Shutter MD Signature Date/Time: 11/01/2022/3:00:23 PM    Final    US THORACENTESIS ASP PLEURAL SPACE W/IMG GUIDE  Result Date: 11/01/2022 INDICATION: 82 year old female presents with shortness of breath, previous imaging showed bilateral pleural effusion right greater than left. Request for therapeutic and diagnostic thoracentesis. EXAM: ULTRASOUND GUIDED RIGHT THORACENTESIS MEDICATIONS: 5 mL 1% lidocaine COMPLICATIONS: None immediate. PROCEDURE: An ultrasound guided thoracentesis was thoroughly discussed with the patient and questions answered. The benefits, risks, alternatives and complications were also discussed. The patient understands and wishes to proceed with the procedure. Written consent was obtained. Ultrasound was  performed to localize and mark an adequate pocket of fluid in the right chest. The area was then prepped and draped in the normal sterile fashion. 1% Lidocaine was used for local anesthesia. Under ultrasound guidance a 6 Fr Safe-T-Centesis catheter was introduced. Thoracentesis was performed. The catheter was removed and a dressing applied. FINDINGS: A total of approximately 1.1 L of hazy yellow fluid was removed. Samples were sent to the laboratory as requested by the clinical team. Postprocedure chest x-ray ordered, results pending. IMPRESSION: Successful ultrasound guided right thoracentesis yielding 1.1 L of pleural fluid. Performed by: Lawernce Ion, PA-C Electronically Signed   By: Judie Petit.  Shick M.D.   On: 11/01/2022 12:57   DG Chest 1 View  Result Date: 11/01/2022 CLINICAL DATA:  Status post right thoracentesis. EXAM: CHEST  1 VIEW COMPARISON:  Two-view chest x-ray 10/29/2022 FINDINGS: The heart is enlarged. The right pleural effusion is markedly decreased. No significant pneumothorax is present. A left pleural effusion remains. Left basilar airspace opacity is noted. Mild pulmonary vascular congestion is present. The visualized soft tissues and bony thorax are unremarkable. IMPRESSION: 1. Marked decrease in right pleural effusion without significant pneumothorax. 2. Persistent left pleural effusion and basilar airspace disease. 3. Cardiomegaly and mild pulmonary vascular congestion. Electronically Signed   By: Marin Roberts M.D.   On: 11/01/2022 12:08   CT CHEST WO CONTRAST  Result Date: 10/31/2022 CLINICAL DATA:  Diffuse/interstitial lung disease EXAM: CT CHEST WITHOUT CONTRAST TECHNIQUE: Multidetector CT imaging of the chest was performed following the standard protocol without IV contrast. RADIATION DOSE REDUCTION: This exam was performed according to the departmental dose-optimization program which includes automated exposure control, adjustment of the mA and/or kV according to patient size and/or  use of iterative reconstruction technique. COMPARISON:  Chest radiograph 10/29/2022 FINDINGS: Cardiovascular: Aortic atherosclerosis without aneurysm. The heart is enlarged. There are coronary artery calcifications. No pericardial effusion. Decreased density of the blood pool suggests anemia. Mediastinum/Nodes: Shotty mediastinal nodes including an 11 mm anterior paratracheal node. Hilar assessment is limited in the absence of IV contrast. Esophagus is grossly normal, partially obscured by adjacent pleural effusions. No thyroid nodule. Lungs/Pleura: Moderate to large right  and moderate left pleural effusion. Assessment of lung parenchyma and interstitial lung disease is limited in the setting of pleural effusions and motion artifact. Subtotal atelectasis and opacification of both lower lobes, compressive atelectasis within the dependent right upper lobe. Additional ground-glass opacities in both lower lobes in the periphery of the right upper lobe. No bronchiectasis. Trachea and central airways are clear. Upper Abdomen: Nodular hepatic contour suggestive of cirrhosis. No acute upper abdominal findings. Musculoskeletal: There are no acute or suspicious osseous abnormalities. No chest wall soft tissue abnormalities. IMPRESSION: 1. Moderate to large right and moderate left pleural effusion. Subtotal atelectasis and opacification of both lower lobes, compressive atelectasis within the dependent right upper lobe. Additional ground-glass opacities in both lower lobes in the periphery of the right upper lobe. Findings may be combination of compressive atelectasis and pulmonary edema or infection. 2. Cardiomegaly with coronary artery calcifications. 3. Nodular hepatic contour suggestive of cirrhosis. 4. Shotty mediastinal nodes are likely reactive. Aortic Atherosclerosis (ICD10-I70.0). Electronically Signed   By: Narda Rutherford M.D.   On: 10/31/2022 16:14   DG Chest 2 View  Result Date: 10/29/2022 CLINICAL DATA:   Cough and shortness of breath EXAM: CHEST - 2 VIEW COMPARISON:  X-ray 04/27/2022 FINDINGS: Underinflation. Bilateral pleural effusions and adjacent opacities. No pneumothorax. Vascular congestion. Mild interstitial prominence. Component of edema is not excluded. Recommend follow-up. Overlapping cardiac leads IMPRESSION: Developing bilateral pleural effusions with the adjacent opacities and interstitial changes. Recommend follow-up Electronically Signed   By: Karen Kays M.D.   On: 10/29/2022 13:56      Subjective: Patient seen and examined at bedside.  Wants to go home today.  Denies worsening shortness breath, chest pain or fever.  Discharge Exam: Vitals:   11/15/22 0451 11/15/22 0739  BP: (!) 121/59 (!) 117/57  Pulse: 93   Resp: 20   Temp: 98.5 F (36.9 C) 98.4 F (36.9 C)  SpO2: 98% 93%    General: Pt is alert, awake, not in acute distress.  Looks chronically ill and deconditioned.  Poor historian.  Flat affect.  On 2 L oxygen via nasal cannula. Cardiovascular: rate controlled, S1/S2 + Respiratory: bilateral decreased breath sounds at bases with scattered crackles Abdominal: Soft, NT, ND, bowel sounds + Extremities: Trace lower extremity edema; no cyanosis    The results of significant diagnostics from this hospitalization (including imaging, microbiology, ancillary and laboratory) are listed below for reference.     Microbiology: Recent Results (from the past 240 hour(s))  SARS Coronavirus 2 by RT PCR (hospital order, performed in [facility] hospital lab) *cepheid single result test* Anterior Nasal Swab     Status: None   Collection Time: 11/12/22  4:55 PM   Specimen: Anterior Nasal Swab  Result Value Ref Range Status   SARS Coronavirus 2 by RT PCR NEGATIVE NEGATIVE Final    Comment: (NOTE) SARS-CoV-2 target nucleic acids are NOT DETECTED.  The SARS-CoV-2 RNA is generally detectable in upper and lower respiratory specimens during the acute phase of infection. The  lowest concentration of SARS-CoV-2 viral copies this assay can detect is 250 copies / mL. A negative result does not preclude SARS-CoV-2 infection and should not be used as the sole basis for treatment or other patient management decisions.  A negative result may occur with improper specimen collection / handling, submission of specimen other than nasopharyngeal swab, presence of viral mutation(s) within the areas targeted by this assay, and inadequate number of viral copies (<250 copies / mL). A negative result must be  combined with clinical observations, patient history, and epidemiological information.  Fact Sheet for Patients:   RoadLapTop.co.za  Fact Sheet for Healthcare Providers: http://kim-miller.com/  This test is not yet approved or  cleared by the [place] FDA and has been authorized for detection and/or diagnosis of SARS-CoV-2 by FDA under an Emergency Use Authorization (EUA).  This EUA will remain in effect (meaning this test can be used) for the duration of the COVID-19 declaration under Section 564(b)(1) of the Act, 21 U.S.C. section 360bbb-3(b)(1), unless the authorization is terminated or revoked sooner.  Performed at [facility], [address] [place], [place] [address]      Labs: BNP (last 3 results) Recent Labs    04/27/22 0800 10/29/22 1222 11/12/22 1549  BNP 499.5* 3,983.8* 4,449.1*   Basic Metabolic Panel: Recent Labs  Lab 11/12/22 1556 11/13/22 0240 11/14/22 0310 11/15/22 0256  NA 131* 130* 135 130*  K 4.2 3.7 3.5 3.4*  CL 94* 94* 95* 95*  CO2 27 28 28 28   GLUCOSE 135* 186* 128* 103*  BUN 28* 26* 27* 24*  CREATININE 1.92* 1.97* 2.17* 2.14*  CALCIUM 9.5 8.9 9.2 8.6*  MG  --  2.1 2.3 2.3   Liver Function Tests: No results for input(s): "AST", "ALT", "ALKPHOS", "BILITOT", "PROT", "ALBUMIN" in the last 168 hours. No results for input(s): "LIPASE", "AMYLASE" in the last  168 hours. No results for input(s): "AMMONIA" in the last 168 hours. CBC: Recent Labs  Lab 11/12/22 1556 11/13/22 0240 11/14/22 0310 11/15/22 0256  WBC 8.8 7.4 7.0 7.2  HGB 9.2* 8.2* 7.9* 8.6*  HCT 28.0* 24.5* 24.1* 26.3*  MCV 96.9 96.8 95.3 96.0  PLT 140* 143* 158 164   Cardiac Enzymes: No results for input(s): "CKTOTAL", "CKMB", "CKMBINDEX", "TROPONINI" in the last 168 hours. BNP: Invalid input(s): "POCBNP" CBG: Recent Labs  Lab 11/14/22 1110 11/14/22 1609 11/14/22 2128 11/15/22 0559 11/15/22 0737  GLUCAP 175* 177* 215* 77 103*   D-Dimer No results for input(s): "DDIMER" in the last 72 hours. Hgb A1c Recent Labs    11/13/22 0240  HGBA1C 5.4   Lipid Profile No results for input(s): "CHOL", "HDL", "LDLCALC", "TRIG", "CHOLHDL", "LDLDIRECT" in the last 72 hours. Thyroid function studies No results for input(s): "TSH", "T4TOTAL", "T3FREE", "THYROIDAB" in the last 72 hours.  Invalid input(s): "FREET3" Anemia work up No results for input(s): "VITAMINB12", "FOLATE", "FERRITIN", "TIBC", "IRON", "RETICCTPCT" in the last 72 hours. Urinalysis    Component Value Date/Time   APPEARANCEUR Clear 10/28/2019 0900   GLUCOSEU 3+ (A) 10/28/2019 0900   BILIRUBINUR Negative 10/28/2019 0900   PROTEINUR Negative 10/28/2019 0900   NITRITE Negative 10/28/2019 0900   LEUKOCYTESUR Negative 10/28/2019 0900   Sepsis Labs Recent Labs  Lab 11/12/22 1556 11/13/22 0240 11/14/22 0310 11/15/22 0256  WBC 8.8 7.4 7.0 7.2   Microbiology Recent Results (from the past 240 hour(s))  SARS Coronavirus 2 by RT PCR (hospital order, performed in [facility] hospital lab) *cepheid single result test* Anterior Nasal Swab     Status: None   Collection Time: 11/12/22  4:55 PM   Specimen: Anterior Nasal Swab  Result Value Ref Range Status   SARS Coronavirus 2 by RT PCR NEGATIVE NEGATIVE Final    Comment: (NOTE) SARS-CoV-2 target nucleic acids are NOT DETECTED.  The SARS-CoV-2 RNA is generally  detectable in upper and lower respiratory specimens during the acute phase of infection. The lowest concentration of SARS-CoV-2 viral copies this assay can detect is 250 copies / mL.  A negative result does not preclude SARS-CoV-2 infection and should not be used as the sole basis for treatment or other patient management decisions.  A negative result may occur with improper specimen collection / handling, submission of specimen other than nasopharyngeal swab, presence of viral mutation(s) within the areas targeted by this assay, and inadequate number of viral copies (<250 copies / mL). A negative result must be combined with clinical observations, patient history, and epidemiological information.  Fact Sheet for Patients:   RoadLapTop.co.za  Fact Sheet for Healthcare Providers: http://kim-miller.com/  This test is not yet approved or  cleared by the [place] FDA and has been authorized for detection and/or diagnosis of SARS-CoV-2 by FDA under an Emergency Use Authorization (EUA).  This EUA will remain in effect (meaning this test can be used) for the duration of the COVID-19 declaration under Section 564(b)(1) of the Act, 21 U.S.C. section 360bbb-3(b)(1), unless the authorization is terminated or revoked sooner.  Performed at [facility], [address] [place], [place] [address]      Time coordinating discharge: 35 minutes  SIGNED:   Glade Lloyd, MD  [facility] 11/15/2022, 10:23 AM

## 2022-11-15 NOTE — Discharge Instructions (Addendum)
Cardiology Follow-Up: - Your visit in the [facility]F Clinic which was scheduled for 11/17/2022 has been cancelled (given it was too close to time of discharge). However, please keep follow-up with Randall An,  PA-C, in the [facility] [place] office on 12/03/2022.

## 2022-11-17 ENCOUNTER — Encounter (HOSPITAL_COMMUNITY): Payer: Medicare HMO

## 2022-12-01 ENCOUNTER — Other Ambulatory Visit: Payer: Self-pay | Admitting: Family

## 2022-12-01 DIAGNOSIS — Z1231 Encounter for screening mammogram for malignant neoplasm of breast: Secondary | ICD-10-CM

## 2022-12-03 ENCOUNTER — Encounter: Payer: Self-pay | Admitting: Student

## 2022-12-03 ENCOUNTER — Ambulatory Visit: Payer: Medicare HMO | Attending: Student | Admitting: Student

## 2022-12-03 VITALS — BP 108/54 | HR 92 | Ht 65.0 in | Wt 121.8 lb

## 2022-12-03 DIAGNOSIS — I10 Essential (primary) hypertension: Secondary | ICD-10-CM

## 2022-12-03 DIAGNOSIS — I340 Nonrheumatic mitral (valve) insufficiency: Secondary | ICD-10-CM

## 2022-12-03 DIAGNOSIS — I2510 Atherosclerotic heart disease of native coronary artery without angina pectoris: Secondary | ICD-10-CM | POA: Diagnosis not present

## 2022-12-03 DIAGNOSIS — N184 Chronic kidney disease, stage 4 (severe): Secondary | ICD-10-CM

## 2022-12-03 DIAGNOSIS — D631 Anemia in chronic kidney disease: Secondary | ICD-10-CM

## 2022-12-03 DIAGNOSIS — E785 Hyperlipidemia, unspecified: Secondary | ICD-10-CM

## 2022-12-03 DIAGNOSIS — N189 Chronic kidney disease, unspecified: Secondary | ICD-10-CM

## 2022-12-03 DIAGNOSIS — I5020 Unspecified systolic (congestive) heart failure: Secondary | ICD-10-CM

## 2022-12-03 MED ORDER — METOPROLOL SUCCINATE ER 25 MG PO TB24: 12.5000 mg | ORAL_TABLET | Freq: Every day | ORAL | 3 refills | Status: AC

## 2022-12-03 NOTE — Progress Notes (Signed)
Cardiology Office Note    Date:  12/03/2022  ID:  Jessica Torres, Jessica Torres 26, 1942, MRN [id number] Cardiologist: Dietrich Pates, MD    History of Present Illness:    Jessica Torres is a 82 y.o. female with past medical history of HFrEF (EF 40 to 45% by echocardiogram in 07/2022, at 30 to 35% by imaging in 10/2022), presumed CAD, carotid artery disease, PAD (prior angioplasty of right SFA), HTN, HLD, Type II DM, history of GI bleed in setting of gastritis, cirrhosis, history of CVA, hypothyroidism, OSA and Stage III-IV CKD who presents to the office today for hospital follow-up.  She was most recently admitted to [facility] from 8/21 - 11/07/2022 for an acute CHF exacerbation and had recently been admitted to [facility] for similar symptoms. 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 She was felt to be an extremely poor candidate for coronary angiography or revascularization given her renal function and GI bleeding. Palliative care consult was recommended and it was decided to pursue outpatient palliative services as she was not interested in Hospice. She did undergo a right thoracentesis with 1.2 L of fluid removed. She overall diuresed 4.1 L and she was transitioned to Lasix 40 mg twice daily as an outpatient. Weight was at 57.5 kg at discharge and additional titration of medical therapy was limited given her soft BP.  She presented back to [facility] on 11/12/2022 for evaluation of worsening edema and a nonproductive cough. BNP was elevated to 4449 and creatinine was at 1.92. She was transferred to [facility] and was again evaluated by Cardiology. She responded well to IV Lasix and was ultimately transitioned to Torsemide 40 mg daily (reduced to 20mg  daily at discharge due to rise in creatinine) and medical management was again felt to be the best option for her and she was felt to be appropriate for  Hospice but she declined. Weight was at 122 lbs at the time of discharge with creatinine at 2.14.  In talking with the patient and her son today, they report that she has been doing remarkably well since her hospital stay. She has been walking in her yard for exercise and denies any recent chest pain or dyspnea on exertion with this. No recent palpitations, orthopnea or PND. She does experience occasional lower extremity edema and takes an extra Torsemide as needed and he estimates they are having do this at least 1-2 times per week. She did have follow-up with Dr. Wolfgang Phoenix in the interim and was told that her kidney function has significantly improved. Outpatient palliative services were arranged at the time of hospital discharge but they plan to cancel these and reinstate her prior home health given improvement in her condition.  Studies Reviewed:   EKG: EKG is not ordered today.  NST: 10/2022 - [facility] Impression  1. Technically difficult study with noncardiac (gut) tracer uptake noted.  There is no convincing evidence of inducible ischemia by myocardial perfusion imaging.  Medium sized fixed defect at the apex with associated akinesis consistent with prior myocardial infarction.  2.  Severely reduced left ventricular systolic function  3.  Nondiagnostic ECG portion of the Lexiscan Cardiolite stress test due to baseline ECG abnormalities.    Limited Echocardiogram: 11/01/2022 IMPRESSIONS     1. Limited study   2. Left ventricular ejection fraction, by estimation, is 30 to 35%. Left  ventricular ejection fraction  by 2D MOD biplane is 32.2 %. The left  ventricle has moderately decreased function. The left ventricle  demonstrates regional wall motion abnormalities  (see scoring diagram/findings for description). diastolic function is  indeterminate due to lack of tissue doppler. There is severe hypokinesis  of the left ventricular, mid-apical inferoseptal wall, septal wall,  lateral wall and  apical segment.   3. A small pericardial effusion is present.   4. Moderate to severe mitral valve regurgitation.   5. The aortic root is heaily calcified, but there does not appears to be  significant aortic stenosis. Doppler velocities were not obtained.   6. There is mildly elevated pulmonary artery systolic pressure. The  estimated right ventricular systolic pressure is 39.4 mmHg.   7. The inferior vena cava is dilated in size with <50% respiratory  variability, suggesting right atrial pressure of 15 mmHg.   Comparison(s): Changes from prior study are noted. 07/14/2022: LVEF 40-45%,  mid/distal lateral, mid/distal septal, distal inferior and apical walls.    Physical Exam:   VS:  BP (!) 108/54   Pulse 92   Ht 5\' 5"  (1.651 m)   Wt 121 lb 12.8 oz (55.2 kg)   SpO2 99%   BMI 20.27 kg/m    Wt Readings from Last 3 Encounters:  12/03/22 121 lb 12.8 oz (55.2 kg)  11/15/22 122 lb 2.2 oz (55.4 kg)  11/07/22 126 lb 12.2 oz (57.5 kg)     GEN: Pleasant, elderly female appearing in no acute distress NECK: No JVD; No carotid bruits CARDIAC: RRR, 2/6 systolic murmur throughout.  RESPIRATORY:  Clear to auscultation without rales, wheezing or rhonchi  ABDOMEN: Appears non-distended. No obvious abdominal masses. EXTREMITIES: No clubbing or cyanosis. Trace ankle edema bilaterally.  Distal pedal pulses are 2+ bilaterally.   Assessment and Plan:   1. Acute HFrEF - Her ejection fraction was at 30 to 35% by most recent assessment with wall motion abnormalities noted. We reviewed this today and that medical management was recommended during her hospitalization given her comorbidities. They are very pleased with how she has been doing since hospital discharge and wish to reinstate home health services which is being arranged by her PCP.  We reviewed that her condition can quickly deteriorate given her cardiomyopathy and presumed CAD but hopefully she will continue to progress well and have a good  quality of life as she is feeling significantly better since hospital discharge. - They are interested in titration of medical therapy to help with her cardiomyopathy and we reviewed that options are limited given her soft BP and renal dysfunction. Will try adding low-dose Toprol-XL 12.5 mg daily. We reviewed that if her SBP drops less than 100 or she experiences bradycardia, this will need to be discontinued. Will continue Torsemide 20 mg daily with instructions to take an extra tablet as needed for weight gain or worsening edema.  2. Mitral Valve Regurgitation - This was moderate to severe by echocardiogram in 10/2022. As discussed above, she was not felt to be a candidate for invasive evaluation given her comorbidities.  - Reviewed with the patient and her son again today. Will continue with medical management.  3. Presumed CAD - She has known presumed CAD as prior stress test showed evidence of prior infarction with no inducible ischemia. Was not felt to be a candidate for a cardiac catheterization during her recent admission given her history of anemia, GI bleed and Stage IV CKD. - Thankfully, she denies any recent anginal symptoms. Continue current  medical therapy with ASA 81 mg daily and Zetia 10 mg daily.  4. HTN - Her blood pressure was soft during admission which led to discontinuation of several medications. BP is at 108/54 during today's visit and they report SBP has been in the 110's at home. Will try to add low-dose Toprol-XL 12.5 mg daily.  5. HLD - LDL was at 57 in 07/2022. Continue current medical therapy with Zetia 10 mg daily as she was previously intolerant to statin therapy due to myalgias.  6. Stage 4 CKD - Baseline creatinine 1.6 - 1.7. Peaked at 2.39 during her admission in 10/2022 but they were informed by Nephrology this had improved significantly at the time of follow-up labs and we will request a copy.  7. Anemia - Hgb was at 8.6 in 11/2022 which is close to her known  baseline. No reports of active bleeding. Will request a copy of most recent labs from Nephrology.  Signed, Ellsworth Lennox, PA-C

## 2022-12-03 NOTE — Patient Instructions (Signed)
Medication Instructions:  Your physician recommends that you continue on your current medications as directed. Please refer to the Current Medication list given to you today.  Start Toprol XL 12.5 mg Daily   *If you need a refill on your cardiac medications before your next appointment, please call your pharmacy*   Lab Work: NONE   If you have labs (blood work) drawn today and your tests are completely normal, you will receive your results only by: MyChart Message (if you have MyChart) OR A paper copy in the mail If you have any lab test that is abnormal or we need to change your treatment, we will call you to review the results.   Testing/Procedures: NONE    Follow-Up: At [facility], you and your health needs are our priority.  As part of our continuing mission to provide you with exceptional heart care, we have created designated Provider Care Teams.  These Care Teams include your primary Cardiologist (physician) and Advanced Practice Providers (APPs -  Physician Assistants and Nurse Practitioners) who all work together to provide you with the care you need, when you need it.  We recommend signing up for the patient portal called "MyChart".  Sign up information is provided on this After Visit Summary.  MyChart is used to connect with patients for Virtual Visits (Telemedicine).  Patients are able to view lab/test results, encounter notes, upcoming appointments, etc.  Non-urgent messages can be sent to your provider as well.   To learn more about what you can do with MyChart, go to [email or web address].    Your next appointment:   2 -3 Months   Provider:   You may see Dietrich Pates, MD or one of the following Advanced Practice Providers on your designated Care Team:   Randall An, PA-C  Jacolyn Reedy, PA-C     Other Instructions Thank you for choosing  [facility]!

## 2022-12-23 ENCOUNTER — Ambulatory Visit: Payer: Medicare HMO | Admitting: Cardiology

## 2023-01-13 ENCOUNTER — Ambulatory Visit: Payer: Medicare HMO

## 2023-01-14 ENCOUNTER — Ambulatory Visit: Payer: Medicare HMO

## 2023-01-15 ENCOUNTER — Emergency Department (HOSPITAL_BASED_OUTPATIENT_CLINIC_OR_DEPARTMENT_OTHER): Payer: Medicare HMO | Admitting: Radiology

## 2023-01-15 ENCOUNTER — Emergency Department (HOSPITAL_BASED_OUTPATIENT_CLINIC_OR_DEPARTMENT_OTHER): Payer: Medicare HMO

## 2023-01-15 ENCOUNTER — Encounter (HOSPITAL_BASED_OUTPATIENT_CLINIC_OR_DEPARTMENT_OTHER): Payer: Self-pay | Admitting: *Deleted

## 2023-01-15 ENCOUNTER — Inpatient Hospital Stay (HOSPITAL_BASED_OUTPATIENT_CLINIC_OR_DEPARTMENT_OTHER)
Admission: EM | Admit: 2023-01-15 | Discharge: 2023-01-21 | DRG: 280 | Disposition: A | Discharge status: Hospice | Payer: Medicare HMO | Source: Ambulatory Visit | Attending: Internal Medicine | Admitting: Internal Medicine

## 2023-01-15 ENCOUNTER — Other Ambulatory Visit: Payer: Self-pay

## 2023-01-15 DIAGNOSIS — N179 Acute kidney failure, unspecified: Secondary | ICD-10-CM | POA: Diagnosis present

## 2023-01-15 DIAGNOSIS — Z515 Encounter for palliative care: Secondary | ICD-10-CM

## 2023-01-15 DIAGNOSIS — K7460 Unspecified cirrhosis of liver: Secondary | ICD-10-CM | POA: Diagnosis present

## 2023-01-15 DIAGNOSIS — E871 Hypo-osmolality and hyponatremia: Secondary | ICD-10-CM | POA: Diagnosis present

## 2023-01-15 DIAGNOSIS — Z8542 Personal history of malignant neoplasm of other parts of uterus: Secondary | ICD-10-CM

## 2023-01-15 DIAGNOSIS — I252 Old myocardial infarction: Secondary | ICD-10-CM

## 2023-01-15 DIAGNOSIS — R7401 Elevation of levels of liver transaminase levels: Secondary | ICD-10-CM

## 2023-01-15 DIAGNOSIS — R7989 Other specified abnormal findings of blood chemistry: Secondary | ICD-10-CM | POA: Insufficient documentation

## 2023-01-15 DIAGNOSIS — E1151 Type 2 diabetes mellitus with diabetic peripheral angiopathy without gangrene: Secondary | ICD-10-CM | POA: Diagnosis present

## 2023-01-15 DIAGNOSIS — J9611 Chronic respiratory failure with hypoxia: Secondary | ICD-10-CM | POA: Insufficient documentation

## 2023-01-15 DIAGNOSIS — I5023 Acute on chronic systolic (congestive) heart failure: Secondary | ICD-10-CM | POA: Diagnosis present

## 2023-01-15 DIAGNOSIS — K7682 Hepatic encephalopathy: Secondary | ICD-10-CM | POA: Diagnosis not present

## 2023-01-15 DIAGNOSIS — K3189 Other diseases of stomach and duodenum: Secondary | ICD-10-CM | POA: Diagnosis present

## 2023-01-15 DIAGNOSIS — E119 Type 2 diabetes mellitus without complications: Secondary | ICD-10-CM

## 2023-01-15 DIAGNOSIS — N184 Chronic kidney disease, stage 4 (severe): Secondary | ICD-10-CM | POA: Diagnosis present

## 2023-01-15 DIAGNOSIS — Z79890 Hormone replacement therapy (postmenopausal): Secondary | ICD-10-CM

## 2023-01-15 DIAGNOSIS — D631 Anemia in chronic kidney disease: Secondary | ICD-10-CM | POA: Diagnosis present

## 2023-01-15 DIAGNOSIS — I8510 Secondary esophageal varices without bleeding: Secondary | ICD-10-CM | POA: Diagnosis present

## 2023-01-15 DIAGNOSIS — J90 Pleural effusion, not elsewhere classified: Secondary | ICD-10-CM | POA: Diagnosis present

## 2023-01-15 DIAGNOSIS — Z8673 Personal history of transient ischemic attack (TIA), and cerebral infarction without residual deficits: Secondary | ICD-10-CM

## 2023-01-15 DIAGNOSIS — Z7982 Long term (current) use of aspirin: Secondary | ICD-10-CM

## 2023-01-15 DIAGNOSIS — I130 Hypertensive heart and chronic kidney disease with heart failure and stage 1 through stage 4 chronic kidney disease, or unspecified chronic kidney disease: Principal | ICD-10-CM | POA: Diagnosis present

## 2023-01-15 DIAGNOSIS — Z90710 Acquired absence of both cervix and uterus: Secondary | ICD-10-CM

## 2023-01-15 DIAGNOSIS — I21A1 Myocardial infarction type 2: Secondary | ICD-10-CM | POA: Diagnosis present

## 2023-01-15 DIAGNOSIS — Z7984 Long term (current) use of oral hypoglycemic drugs: Secondary | ICD-10-CM

## 2023-01-15 DIAGNOSIS — K761 Chronic passive congestion of liver: Secondary | ICD-10-CM | POA: Diagnosis present

## 2023-01-15 DIAGNOSIS — Z8249 Family history of ischemic heart disease and other diseases of the circulatory system: Secondary | ICD-10-CM

## 2023-01-15 DIAGNOSIS — K766 Portal hypertension: Secondary | ICD-10-CM | POA: Diagnosis present

## 2023-01-15 DIAGNOSIS — Z66 Do not resuscitate: Secondary | ICD-10-CM | POA: Diagnosis present

## 2023-01-15 DIAGNOSIS — D649 Anemia, unspecified: Secondary | ICD-10-CM | POA: Diagnosis present

## 2023-01-15 DIAGNOSIS — R64 Cachexia: Secondary | ICD-10-CM | POA: Diagnosis present

## 2023-01-15 DIAGNOSIS — Z1152 Encounter for screening for COVID-19: Secondary | ICD-10-CM

## 2023-01-15 DIAGNOSIS — Z833 Family history of diabetes mellitus: Secondary | ICD-10-CM

## 2023-01-15 DIAGNOSIS — Z79899 Other long term (current) drug therapy: Secondary | ICD-10-CM

## 2023-01-15 DIAGNOSIS — Z6820 Body mass index (BMI) 20.0-20.9, adult: Secondary | ICD-10-CM

## 2023-01-15 DIAGNOSIS — R0602 Shortness of breath: Secondary | ICD-10-CM | POA: Diagnosis not present

## 2023-01-15 DIAGNOSIS — K7290 Hepatic failure, unspecified without coma: Secondary | ICD-10-CM | POA: Diagnosis present

## 2023-01-15 DIAGNOSIS — R188 Other ascites: Principal | ICD-10-CM

## 2023-01-15 DIAGNOSIS — K8020 Calculus of gallbladder without cholecystitis without obstruction: Secondary | ICD-10-CM | POA: Diagnosis present

## 2023-01-15 DIAGNOSIS — E785 Hyperlipidemia, unspecified: Secondary | ICD-10-CM | POA: Diagnosis present

## 2023-01-15 DIAGNOSIS — D693 Immune thrombocytopenic purpura: Secondary | ICD-10-CM | POA: Insufficient documentation

## 2023-01-15 DIAGNOSIS — E039 Hypothyroidism, unspecified: Secondary | ICD-10-CM | POA: Diagnosis present

## 2023-01-15 DIAGNOSIS — K767 Hepatorenal syndrome: Secondary | ICD-10-CM | POA: Diagnosis not present

## 2023-01-15 DIAGNOSIS — E8809 Other disorders of plasma-protein metabolism, not elsewhere classified: Secondary | ICD-10-CM | POA: Diagnosis present

## 2023-01-15 DIAGNOSIS — Z885 Allergy status to narcotic agent status: Secondary | ICD-10-CM

## 2023-01-15 DIAGNOSIS — Z9981 Dependence on supplemental oxygen: Secondary | ICD-10-CM

## 2023-01-15 DIAGNOSIS — Z91040 Latex allergy status: Secondary | ICD-10-CM

## 2023-01-15 DIAGNOSIS — L270 Generalized skin eruption due to drugs and medicaments taken internally: Secondary | ICD-10-CM | POA: Diagnosis not present

## 2023-01-15 DIAGNOSIS — J9811 Atelectasis: Secondary | ICD-10-CM | POA: Diagnosis present

## 2023-01-15 DIAGNOSIS — Z7902 Long term (current) use of antithrombotics/antiplatelets: Secondary | ICD-10-CM

## 2023-01-15 DIAGNOSIS — M109 Gout, unspecified: Secondary | ICD-10-CM | POA: Insufficient documentation

## 2023-01-15 DIAGNOSIS — I340 Nonrheumatic mitral (valve) insufficiency: Secondary | ICD-10-CM | POA: Diagnosis present

## 2023-01-15 DIAGNOSIS — I9589 Other hypotension: Secondary | ICD-10-CM | POA: Diagnosis not present

## 2023-01-15 DIAGNOSIS — I255 Ischemic cardiomyopathy: Secondary | ICD-10-CM | POA: Insufficient documentation

## 2023-01-15 DIAGNOSIS — E1122 Type 2 diabetes mellitus with diabetic chronic kidney disease: Secondary | ICD-10-CM | POA: Diagnosis present

## 2023-01-15 DIAGNOSIS — Z860100 Personal history of colon polyps, unspecified: Secondary | ICD-10-CM

## 2023-01-15 DIAGNOSIS — K219 Gastro-esophageal reflux disease without esophagitis: Secondary | ICD-10-CM | POA: Diagnosis present

## 2023-01-15 DIAGNOSIS — I509 Heart failure, unspecified: Secondary | ICD-10-CM

## 2023-01-15 DIAGNOSIS — I2510 Atherosclerotic heart disease of native coronary artery without angina pectoris: Secondary | ICD-10-CM | POA: Diagnosis present

## 2023-01-15 DIAGNOSIS — T402X5A Adverse effect of other opioids, initial encounter: Secondary | ICD-10-CM | POA: Diagnosis not present

## 2023-01-15 DIAGNOSIS — Z888 Allergy status to other drugs, medicaments and biological substances status: Secondary | ICD-10-CM

## 2023-01-15 DIAGNOSIS — Z794 Long term (current) use of insulin: Secondary | ICD-10-CM

## 2023-01-15 DIAGNOSIS — K529 Noninfective gastroenteritis and colitis, unspecified: Secondary | ICD-10-CM

## 2023-01-15 DIAGNOSIS — Z86018 Personal history of other benign neoplasm: Secondary | ICD-10-CM

## 2023-01-15 LAB — COMPREHENSIVE METABOLIC PANEL
ALT: 13 U/L (ref 0–44)
AST: 46 U/L — ABNORMAL HIGH (ref 15–41)
Albumin: 3.2 g/dL — ABNORMAL LOW (ref 3.5–5.0)
Alkaline Phosphatase: 46 U/L (ref 38–126)
Anion gap: 11 (ref 5–15)
BUN: 31 mg/dL — ABNORMAL HIGH (ref 8–23)
CO2: 26 mmol/L (ref 22–32)
Calcium: 10.2 mg/dL (ref 8.9–10.3)
Chloride: 94 mmol/L — ABNORMAL LOW (ref 98–111)
Creatinine, Ser: 2.52 mg/dL — ABNORMAL HIGH (ref 0.44–1.00)
GFR, Estimated: 19 mL/min — ABNORMAL LOW (ref >60)
Glucose, Bld: 171 mg/dL — ABNORMAL HIGH (ref 70–99)
Potassium: 4.2 mmol/L (ref 3.5–5.1)
Sodium: 131 mmol/L — ABNORMAL LOW (ref 135–145)
Total Bilirubin: 8.7 mg/dL — ABNORMAL HIGH (ref <1.2)
Total Protein: 7.1 g/dL (ref 6.5–8.1)

## 2023-01-15 LAB — CBC WITH DIFFERENTIAL/PLATELET
Abs Immature Granulocytes: 0.05 10*3/uL (ref 0.00–0.07)
Basophils Absolute: 0.1 10*3/uL (ref 0.0–0.1)
Basophils Relative: 1 %
Eosinophils Absolute: 0.2 10*3/uL (ref 0.0–0.5)
Eosinophils Relative: 2 %
HCT: 31.3 % — ABNORMAL LOW (ref 36.0–46.0)
Hemoglobin: 11.3 g/dL — ABNORMAL LOW (ref 12.0–15.0)
Immature Granulocytes: 1 %
Lymphocytes Relative: 14 %
Lymphs Abs: 1.3 10*3/uL (ref 0.7–4.0)
MCH: 35.2 pg — ABNORMAL HIGH (ref 26.0–34.0)
MCHC: 36.1 g/dL — ABNORMAL HIGH (ref 30.0–36.0)
MCV: 97.5 fL (ref 80.0–100.0)
Monocytes Absolute: 0.9 10*3/uL (ref 0.1–1.0)
Monocytes Relative: 10 %
Neutro Abs: 6.4 10*3/uL (ref 1.7–7.7)
Neutrophils Relative %: 72 %
Platelets: 151 10*3/uL (ref 150–400)
RBC: 3.21 MIL/uL — ABNORMAL LOW (ref 3.87–5.11)
RDW: 18.6 % — ABNORMAL HIGH (ref 11.5–15.5)
WBC: 8.9 10*3/uL (ref 4.0–10.5)
nRBC: 0 % (ref 0.0–0.2)

## 2023-01-15 LAB — RESP PANEL BY RT-PCR (RSV, FLU A&B, COVID)  RVPGX2
Influenza A by PCR: NEGATIVE
Influenza B by PCR: NEGATIVE
Resp Syncytial Virus by PCR: NEGATIVE
SARS Coronavirus 2 by RT PCR: NEGATIVE

## 2023-01-15 LAB — URINALYSIS, W/ REFLEX TO CULTURE (INFECTION SUSPECTED)
Glucose, UA: NEGATIVE mg/dL
Hgb urine dipstick: NEGATIVE
Ketones, ur: NEGATIVE mg/dL
Nitrite: NEGATIVE
Protein, ur: NEGATIVE mg/dL
Specific Gravity, Urine: 1.011 (ref 1.005–1.030)
pH: 5 (ref 5.0–8.0)

## 2023-01-15 LAB — LACTIC ACID, PLASMA
Lactic Acid, Venous: 1.9 mmol/L (ref 0.5–1.9)
Lactic Acid, Venous: 1.7 mmol/L (ref 0.5–1.9)

## 2023-01-15 LAB — LIPASE, BLOOD: Lipase: 47 U/L (ref 11–51)

## 2023-01-15 LAB — BRAIN NATRIURETIC PEPTIDE: B Natriuretic Peptide: 2619.5 pg/mL — ABNORMAL HIGH (ref 0.0–100.0)

## 2023-01-15 LAB — TROPONIN I (HIGH SENSITIVITY)
Troponin I (High Sensitivity): 78 ng/L — ABNORMAL HIGH (ref <18)
Troponin I (High Sensitivity): 69 ng/L — ABNORMAL HIGH (ref <18)

## 2023-01-15 LAB — BILIRUBIN, DIRECT: Bilirubin, Direct: 3.9 mg/dL — ABNORMAL HIGH (ref 0.0–0.2)

## 2023-01-15 MED ORDER — PIPERACILLIN-TAZOBACTAM IN DEX 2-0.25 GM/50ML IV SOLN
2.2500 g | Freq: Once | INTRAVENOUS | Status: DC
  Filled 2023-01-15: qty 50

## 2023-01-15 MED ORDER — SODIUM CHLORIDE 0.9 % IV BOLUS
500.0000 mL | Freq: Once | INTRAVENOUS | Status: AC
  Administered 2023-01-15: 500 mL via INTRAVENOUS

## 2023-01-15 MED ORDER — FENTANYL CITRATE PF 50 MCG/ML IJ SOSY
25.0000 ug | PREFILLED_SYRINGE | Freq: Once | INTRAMUSCULAR | Status: AC
  Administered 2023-01-15: 25 ug via INTRAVENOUS
  Filled 2023-01-15: qty 1

## 2023-01-15 NOTE — ED Provider Notes (Signed)
[place] EMERGENCY DEPARTMENT AT [facility] Provider Note   CSN: [id number] Arrival date & time: 01/15/23  1534     History  Chief Complaint  Patient presents with   Diarrhea    Jessica Torres is a 82 y.o. female.  HPI   82 year old female presents emergency department with concern for diarrhea.  She is accompanied by family.  She has history of cirrhosis, CHF, CKD.  Has been admitted before for fluid overload.  Coming in today with 1 month of worsening fluid retention despite oral diuretics as well as 1 week of diarrhea that initially was bloody and now watery.  Complaining of pitting edema of her lower extremities and lower abdomen.  Complaining of ongoing shortness of breath, nonproductive cough.  Was seen at nephrology appointment today, noted to be hypotensive with worsening kidney function and referred here.  Patient denies any fever or chills.  No active chest pain.  She feels generally fatigued and weak.  Home Medications Prior to Admission medications   Medication Sig Start Date End Date Taking? Authorizing Provider  meclizine (ANTIVERT) 25 MG tablet Take 12.5-25 mg by mouth daily as needed. 12/23/22  Yes [provider]  Accu-Chek Softclix Lancets lancets Test BS TID Dx E11.9 12/02/21   Junie Spencer, FNP  aspirin 81 MG chewable tablet Chew 1 tablet (81 mg total) by mouth daily. 07/18/22   Tyrone Nine, MD  BD PEN NEEDLE NANO 2ND GEN 32G X 4 MM MISC Use twice daily with insulin Dx E11.9 11/25/21   Jannifer Rodney A, FNP  clopidogrel (PLAVIX) 75 MG tablet Take 75 mg by mouth daily.    [provider]  dextromethorphan 15 MG/5ML syrup Take 10 mLs by mouth 4 (four) times daily as needed for cough.    [provider]  ezetimibe (ZETIA) 10 MG tablet Take 1 tablet (10 mg total) by mouth daily. Patient taking differently: Take 10 mg by mouth at bedtime. 08/22/21   Junie Spencer, FNP  fenofibrate 160 MG tablet TAKE 1 TABLET DAILY 04/02/22    Jannifer Rodney A, FNP  glucose blood (FREESTYLE LITE) test strip Test BS TID Dx E11.9 11/25/21   Jannifer Rodney A, FNP  insulin glargine, 2 Unit Dial, (TOUJEO MAX SOLOSTAR) 300 UNIT/ML Solostar Pen Inject 15 Units into the skin at bedtime. 11/15/22 01/14/23  Glade Lloyd, MD  levothyroxine (SYNTHROID) 88 MCG tablet TAKE 1 TABLET DAILY BEFORE BREAKFAST Patient taking differently: Take 88 mcg by mouth daily before breakfast. 04/24/22   Jannifer Rodney A, FNP  metoprolol succinate (TOPROL XL) 25 MG 24 hr tablet Take 0.5 tablets (12.5 mg total) by mouth daily. 12/03/22   Strader, Lennart Pall, PA-C  ondansetron (ZOFRAN) 4 MG tablet Take 1 tablet (4 mg total) by mouth every 8 (eight) hours as needed for nausea or vomiting. 11/07/22   Zannie Cove, MD  pantoprazole (PROTONIX) 40 MG tablet Take 1 tablet (40 mg total) by mouth daily. Follow up with gastroenterology for additional recommendations 11/15/22   Glade Lloyd, MD  potassium chloride SA (KLOR-CON M) 20 MEQ tablet Take 1 tablet (20 mEq total) by mouth daily. 11/15/22   Glade Lloyd, MD  torsemide (DEMADEX) 20 MG tablet Take 1 tablet (20 mg total) by mouth daily. 11/16/22   Glade Lloyd, MD  metFORMIN (GLUCOPHAGE) 1000 MG tablet Take 1 tablet (1,000 mg total) by mouth 2 (two) times daily with a meal. Patient taking differently: Take 1,000 mg by mouth 2 (two) times daily with  a meal. Only taking once a day 04/06/19 07/13/19  Remus Loffler, PA-C      Allergies    Morphine and codeine, Latex, and Statins    Review of Systems   Review of Systems  Constitutional:  Positive for appetite change and fatigue. Negative for fever.  Respiratory:  Positive for cough and shortness of breath. Negative for chest tightness.   Cardiovascular:  Positive for leg swelling. Negative for chest pain.  Gastrointestinal:  Positive for abdominal distention, abdominal pain, blood in stool, diarrhea and nausea. Negative for rectal pain and vomiting.  Skin:  Negative for rash.   Neurological:  Negative for headaches.    Physical Exam Updated Vital Signs BP 129/60   Pulse (!) 109   Temp 98.1 F (36.7 C) (Oral)   Resp 19   Ht 5\' 5"  (1.651 m)   Wt 56.9 kg   SpO2 94%   BMI 20.87 kg/m  Physical Exam Vitals and nursing note reviewed.  Constitutional:      Appearance: Normal appearance. She is ill-appearing.  HENT:     Head: Normocephalic.     Mouth/Throat:     Mouth: Mucous membranes are moist.  Cardiovascular:     Rate and Rhythm: Tachycardia present.  Pulmonary:     Comments: Tachypneic with conversation, diminished breath sounds bilaterally with rales at the bases Abdominal:     General: Bowel sounds are normal.     Comments: Diffusely tender, without peritonitis  Musculoskeletal:     Comments: 1-2+ pitting edema  Skin:    General: Skin is warm.  Neurological:     Mental Status: She is alert and oriented to person, place, and time. Mental status is at baseline.     ED Results / Procedures / Treatments   Labs (all labs ordered are listed, but only abnormal results are displayed) Labs Reviewed  COMPREHENSIVE METABOLIC PANEL - Abnormal; Notable for the following components:      Result Value   Sodium 131 (*)    Chloride 94 (*)    Glucose, Bld 171 (*)    BUN 31 (*)    Creatinine, Ser 2.52 (*)    Albumin 3.2 (*)    AST 46 (*)    Total Bilirubin 8.7 (*)    GFR, Estimated 19 (*)    All other components within normal limits  CBC WITH DIFFERENTIAL/PLATELET - Abnormal; Notable for the following components:   RBC 3.21 (*)    Hemoglobin 11.3 (*)    HCT 31.3 (*)    MCH 35.2 (*)    MCHC 36.1 (*)    RDW 18.6 (*)    All other components within normal limits  URINALYSIS, W/ REFLEX TO CULTURE (INFECTION SUSPECTED) - Abnormal; Notable for the following components:   APPearance HAZY (*)    Bilirubin Urine SMALL (*)    Leukocytes,Ua TRACE (*)    Bacteria, UA FEW (*)    All other components within normal limits  BRAIN NATRIURETIC PEPTIDE -  Abnormal; Notable for the following components:   B Natriuretic Peptide 2,619.5 (*)    All other components within normal limits  TROPONIN I (HIGH SENSITIVITY) - Abnormal; Notable for the following components:   Troponin I (High Sensitivity) 78 (*)    All other components within normal limits  LACTIC ACID, PLASMA  LIPASE, BLOOD  LACTIC ACID, PLASMA    EKG EKG Interpretation Date/Time:  Thursday January 15 2023 16:02:22 EST Ventricular Rate:  114 PR Interval:  169 QRS Duration:  77 QT Interval:  270 QTC Calculation: 372 R Axis:   97  Text Interpretation: Sinus tachycardia Consider right atrial enlargement Right axis deviation Anteroseptal infarct, old Nonspecific repol abnormality, diffuse leads Confirmed by Coralee Pesa [phone]) on 01/15/2023 4:56:54 PM  Radiology No results found.  Procedures Procedures    Medications Ordered in ED Medications  sodium chloride 0.9 % bolus 500 mL (500 mLs Intravenous New Bag/Given 01/15/23 1705)    ED Course/ Medical Decision Making/ A&P                                 Medical Decision Making Amount and/or Complexity of Data Reviewed Labs: ordered. Radiology: ordered.  Risk Prescription drug management. Decision regarding hospitalization.   82 year old female presents to the emergency department with concern for shortness of breath, abdominal pain and diarrhea.  She wears supplemental nasal cannula oxygen as needed and has been wearing it consistently over the last couple days.  History of cirrhosis with pleural effusion that has required thoracentesis in the past.  She is afebrile, stable blood pressure, tachycardic and at times tachypneic with conversation.  She has diminished breath sounds on lung auscultation specifically in the right lower.  Abdomen is tender more so in the lower abdomen.  Blood work shows no leukocytosis.  She has worsening kidney dysfunction along with worsening hyperbilirubinemia.  She is jaundiced on  exam.  BNP and troponin are elevated but around baseline.  Lipase is negative.  Chest x-ray shows worsening/recurrent pleural effusion on the right.  CT of the abdomen pelvis shows moderate ascites with findings of colitis.  She is afebrile with no white count, I have lower suspicion for SBP at this time.  Blood pressure became soft with systolics around 88.  Patient given a small fluid bolus.  Goal would be for diuresis if blood pressure is tolerable.  Patients evaluation and results requires admission for further treatment and care.  Spoke with hospitalist, reviewed patient's ED course and they accept admission.  Patient agrees with admission plan, offers no new complaints and is stable/unchanged at time of admit.        Final Clinical Impression(s) / ED Diagnoses Final diagnoses:  None    Rx / DC Orders ED Discharge Orders     None         Rozelle Logan, DO 01/15/23 2216

## 2023-01-15 NOTE — ED Triage Notes (Addendum)
Pt to ED reporting diarrhea x 1 month and fluid retension to abd and bilateral lower extremities x 2 weeks. Pt takes torsemide 20 mg PO at home that has not been helping recently. Increasing shortness of breath and cough also reported.   Hypotension noted today at nephrologist appointment.

## 2023-01-15 NOTE — Progress Notes (Signed)
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 Presented to [facility] with few days of SOB, cough, lower abd pain, diarrhea. Lab eval notable for mild hypoNa, AKI Cr b/l ~1.7 -> 2.5, T bili 8.7, other lft near normal, BNP 2619, trop 78 -> 69, WBC 8. CXR with increased R effusion, and interstitial opacities. CT A/P with mod-large ascities, ? Colitis transverse to distal colon, generalized edema in SQ. Picture is mixed with EDP concern re: worsening liver function, cirrhosis, unclear volume status, likely need for paracentesis, thoracentesis, ? Colitis. My impression is likely decompensated heart failure with volume overload, congestive hepatopathy, cardiorenal picture. The question of resp symptoms + ? Colitis raising possibility of viral syndrome. Asked EDP to send resp viral panel, C diff, stool studies. I will add direct fraction for bili. If EDP comfortable and BP adequate recommended for diuresis. Pending result of RVP may consider Zosyn for colitis.     Plan of care: The patient is accepted for admission to Telemetry unit, at [facility] or [facility]   Author: Dolly Rias, MD 01/15/2023  Check [email or web address] for on-call coverage.  Nursing staff, Please call [facility] Admits & Consults System-Wide number on Amion as soon as patient's arrival, so appropriate admitting provider can evaluate the pt.

## 2023-01-15 NOTE — ED Notes (Signed)
..ED TO INPATIENT HANDOFF REPORT  ED Nurse Name and Phone #: [phone]  S Name/Age/Gender Jessica Torres 82 y.o. female Room/Bed: DB011/DB011  Code Status   Code Status: Prior  Home/SNF/Other Home Patient oriented to: self, place, and time Is this baseline? Yes   Triage Complete: Triage complete  Chief Complaint Decompensated heart failure (HCC) [I50.9]  Triage Note Pt to ED reporting diarrhea x 1 month and fluid retension to abd and bilateral lower extremities x 2 weeks. Pt takes torsemide 20 mg PO at home that has not been helping recently. Increasing shortness of breath and cough also reported.   Hypotension noted today at nephrologist appointment.    Allergies Allergies  Allergen Reactions   Morphine And Codeine Swelling and Other (See Comments)    Facial swelling   Latex Rash   Statins Other (See Comments)    Myalgias     Level of Care/Admitting Diagnosis ED Disposition     ED Disposition  Admit   Condition  --   Comment  Hospital [facility]: [facility] [[id number]]  Level of Care: Telemetry Cardiac [[id number]]  May admit patient to [facility] or [facility] if equivalent level of care is available:: Yes  Interfacility transfer: Yes  Covid Evaluation: Symptomatic Person Under Investigation (PUI) or recent exposure (last 10 days) *Testing Required*  Diagnosis: Decompensated heart failure [facility]) [[id number]]  Admitting Physician: Dolly Rias [[id number]]  Attending Physician: Dolly Rias [[id number]]  Certification:: I certify this patient will need inpatient services for at least 2 midnights  Expected Medical Readiness: 01/19/2023          B Medical/Surgery History Past Medical History:  Diagnosis Date   Arthritis    "fingers probably" (10/30/2016)   Bulging lumbar disc    Chronic lower back pain    Difficulty urinating    GERD (gastroesophageal reflux disease)    Heart murmur    hx   HFrEF (heart failure with reduced ejection fraction)  (HCC)    a. EF 40 to 45% by echocardiogram in 07/2022 b. EF at 30 to 35% by imaging in 10/2022   Hyperlipidemia    statin intolerant   Hypertension    Hypothyroidism    PAD (peripheral artery disease) (HCC)    Sleep apnea    "dx'd years ago; had severe problems; all of the sudden it just went away" (07/06/2014) *still gone" (10/30/2016)   Stroke [facility]) ~ 2010   denies residual on 10/30/2016   Type 2 diabetes mellitus (HCC)    Urgency of urination    Uterine cancer [facility])    Past Surgical History:  Procedure Laterality Date   ANGIOPLASTY Right 01/22/2015   w/ atherectomy R sfa   BALLOON ANGIOPLASTY, ARTERY Right    SFA/notes 07/06/2014   BIOPSY  04/28/2022   Procedure: BIOPSY;  Surgeon: Tressia Danas, MD;  Location: [facility] ENDOSCOPY;  Service: Gastroenterology;;   BREAST EXCISIONAL BIOPSY Left    BREAST SURGERY     CARPAL TUNNEL RELEASE Right 1980's   COLONOSCOPY WITH PROPOFOL N/A 11/26/2020   Procedure: COLONOSCOPY WITH PROPOFOL;  Surgeon: Lanelle Bal, DO;  Location: [facility] ENDO SUITE;  Service: Endoscopy;  Laterality: N/A;  9:00am, pt cannot come in earlier   DILATION AND CURETTAGE OF UTERUS     ESOPHAGOGASTRODUODENOSCOPY N/A 04/28/2022   Procedure: ESOPHAGOGASTRODUODENOSCOPY (EGD);  Surgeon: Tressia Danas, MD;  Location: [facility] ENDOSCOPY;  Service: Gastroenterology;  Laterality: N/A;   ESOPHAGOGASTRODUODENOSCOPY (EGD) WITH PROPOFOL N/A 04/30/2022   Procedure: ESOPHAGOGASTRODUODENOSCOPY (EGD) WITH PROPOFOL;  Surgeon: Tressia Danas, MD;  Location: [facility] ENDOSCOPY;  Service: Gastroenterology;  Laterality: N/A;  With [facility]   ESOPHAGOGASTRODUODENOSCOPY (EGD) WITH PROPOFOL N/A 07/15/2022   Procedure: ESOPHAGOGASTRODUODENOSCOPY (EGD) WITH PROPOFOL;  Surgeon: Dolores Frame, MD;  Location[facility] ENDO [facility]ITE;  Service: Gastroenterology;  Laterality: N/A;   HEMORRHOID SURGERY     "dr lanced it; in dr's office"   HOT HEMOSTASIS N/A 04/30/2022   Procedure: HOT HEMOSTASIS (ARGON PLASMA  COAGULATION/BICAP);  Surgeon: Tressia Danas, MD;  Location: [facility] ENDOSCOPY;  Service: Gastroenterology;  Laterality: N/A;   HOT HEMOSTASIS  07/15/2022   Procedure: HOT HEMOSTASIS (ARGON PLASMA COAGULATION/BICAP);  Surgeon: Marguerita Merles, Reuel Boom, MD;  Location[facility] ENDO [facility];  Service: Gastroenterology;;   INCISION AND DRAINAGE BREAST ABSCESS Left    LOWER EXTREMITY ANGIOGRAM N/A 07/06/2014   Procedure: LOWER EXTREMITY ANGIOGRAM & R-SFA atherectomy and balloon angioplasty;  Surgeon: Runell Gess, MD;  Location: [facility] CATH LAB;  Service: Cardiovascular;  Laterality: R   LOWER EXTREMITY ANGIOGRAM  10/30/2016   LOWER EXTREMITY ANGIOGRAPHY N/A 10/30/2016   Procedure: Lower Extremity Angiography;  Surgeon: Runell Gess, MD;  Location: [facility] INVASIVE CV LAB;  Service: Cardiovascular;  Laterality: N/A;   PERIPHERAL VASCULAR CATHETERIZATION N/A 08/17/2014   Procedure: Lower Extremity Angiography;  Surgeon: Runell Gess, MD;  Location: [facility] INVASIVE CV LAB;  Service: Cardiovascular;  Laterality: N/A;   PERIPHERAL VASCULAR CATHETERIZATION Left 08/17/2014   Procedure: Peripheral Vascular Atherectomy and Viabahn stent L-SFA;  Surgeon: Runell Gess, MD; Laterality: Left; L-SFA   PERIPHERAL VASCULAR CATHETERIZATION N/A 01/11/2015   Procedure: Lower Extremity Angiography;  Surgeon: Runell Gess, MD;  Location: [facility] INVASIVE CV LAB;  Service: Cardiovascular;  Laterality: N/A;   PERIPHERAL VASCULAR CATHETERIZATION Right 01/22/2015   Procedure: Peripheral Vascular Atherectomy;  Surgeon: Runell Gess, MD;  Location: [facility] INVASIVE CV LAB;  Service: Cardiovascular;  Laterality: Right;   POLYPECTOMY  11/26/2020   Procedure: POLYPECTOMY;  Surgeon: Lanelle Bal, DO;  Location[facility] ENDO [facility];  Service: Endoscopy;;   POLYPECTOMY  07/15/2022   Procedure: POLYPECTOMY INTESTINAL;  Surgeon: Dolores Frame, MD;  Location[facility]O [facility];  Service: Gastroenterology;;   TUBAL LIGATION     VAGINAL  HYSTERECTOMY       A IV Location/Drains/Wounds Patient Lines/Drains/Airways Status     Active Line/Drains/Airways     Name Placement date Placement time Site Days   Peripheral IV 01/15/23 20 G Right Antecubital 01/15/23  1643  Antecubital  less than 1            Intake/Output Last 24 hours  Intake/Output Summary (Last 24 hours) at 01/15/2023 2334 Last data filed at 01/15/2023 2209 Gross per 24 hour  Intake 500 ml  Output 225 ml  Net 275 ml    Labs/Imaging Results for orders placed or performed during the hospital encounter of 01/15/23 (from the past 48 hour(s))  Lactic acid, plasma     Status: None   Collection Time: 01/15/23  3:52 PM  Result Value Ref Range   Lactic Acid, Venous 1.9 0.5 - 1.9 mmol/L    Comment: Performed at [facility], [address], [place], [place] [address]  Comprehensive metabolic panel     Status: Abnormal   Collection Time: 01/15/23  3:52 PM  Result Value Ref Range   Sodium 131 (L) 135 - 145 mmol/L   Potassium 4.2 3.5 - 5.1 mmol/L   Chloride 94 (L) 98 - 111 mmol/L   CO2 26 22 - 32  mmol/L   Glucose, Bld 171 (H) 70 - 99 mg/dL    Comment: Glucose reference range applies only to samples taken after fasting for at least 8 hours.   BUN 31 (H) 8 - 23 mg/dL   Creatinine, Ser 3.55 (H) 0.44 - 1.00 mg/dL   Calcium 73.2 8.9 - 20.2 mg/dL   Total Protein 7.1 6.5 - 8.1 g/dL   Albumin 3.2 (L) 3.5 - 5.0 g/dL   AST 46 (H) 15 - 41 U/L   ALT 13 0 - 44 U/L   Alkaline Phosphatase 46 38 - 126 U/L   Total Bilirubin 8.7 (H) <1.2 mg/dL   GFR, Estimated 19 (L) >60 mL/min    Comment: (NOTE) Calculated using the CKD-EPI Creatinine Equation (2021)    Anion gap 11 5 - 15    Comment: Performed at [facility], [address], [place], [place] [address]  CBC with Differential     Status: Abnormal   Collection Time: 01/15/23  3:52 PM  Result Value Ref Range   WBC 8.9 4.0 - 10.5 K/uL   RBC 3.21 (L) 3.87 - 5.11 MIL/uL    Hemoglobin 11.3 (L) 12.0 - 15.0 g/dL   HCT 62.3 (L) 76.2 - 83.1 %   MCV 97.5 80.0 - 100.0 fL   MCH 35.2 (H) 26.0 - 34.0 pg   MCHC 36.1 (H) 30.0 - 36.0 g/dL   RDW 51.7 (H) 61.6 - 07.3 %   Platelets 151 150 - 400 K/uL   nRBC 0.0 0.0 - 0.2 %   Neutrophils Relative % 72 %   Neutro Abs 6.4 1.7 - 7.7 K/uL   Lymphocytes Relative 14 %   Lymphs Abs 1.3 0.7 - 4.0 K/uL   Monocytes Relative 10 %   Monocytes Absolute 0.9 0.1 - 1.0 K/uL   Eosinophils Relative 2 %   Eosinophils Absolute 0.2 0.0 - 0.5 K/uL   Basophils Relative 1 %   Basophils Absolute 0.1 0.0 - 0.1 K/uL   Immature Granulocytes 1 %   Abs Immature Granulocytes 0.05 0.00 - 0.07 K/uL    Comment: Performed at [facility], [address] [place], [place] [address]  Urinalysis, w/ Reflex to Culture (Infection Suspected) -Urine, Clean Catch     Status: Abnormal   Collection Time: 01/15/23  3:52 PM  Result Value Ref Range   Specimen Source URINE, CLEAN CATCH    Color, Urine YELLOW YELLOW   APPearance HAZY (A) CLEAR   Specific Gravity, Urine 1.011 1.005 - 1.030   pH 5.0 5.0 - 8.0   Glucose, UA NEGATIVE NEGATIVE mg/dL   Hgb urine dipstick NEGATIVE NEGATIVE   Bilirubin Urine SMALL (A) NEGATIVE   Ketones, ur NEGATIVE NEGATIVE mg/dL   Protein, ur NEGATIVE NEGATIVE mg/dL   Nitrite NEGATIVE NEGATIVE   Leukocytes,Ua TRACE (A) NEGATIVE   RBC / HPF 0-5 0 - 5 RBC/hpf   WBC, UA 0-5 0 - 5 WBC/hpf    Comment:        Reflex urine culture not performed if WBC <=10, OR if Squamous epithelial cells >5. If Squamous epithelial cells >5 suggest recollection.    Bacteria, UA FEW (A) NONE SEEN   Squamous Epithelial / HPF 0-5 0 - 5 /HPF   Hyaline Casts, UA PRESENT     Comment: Performed at [facility], [address] [place], [place] [address]  Lipase, blood     Status: None   Collection Time: 01/15/23  3:52 PM  Result Value Ref Range  Lipase 47 11 - 51 U/L    Comment: Performed at [facility], [address] [place], [place] [address]  Troponin I (High Sensitivity)     Status: Abnormal   Collection Time: 01/15/23  4:58 PM  Result Value Ref Range   Troponin I (High Sensitivity) 78 (H) <18 ng/L    Comment: (NOTE) Elevated high sensitivity troponin I (hsTnI) values and significant  changes across serial measurements may suggest ACS but many other  chronic and acute conditions are known to elevate hsTnI results.  Refer to the "Links" section for chest pain algorithms and additional  guidance. Performed at [facility], [address], [place], [place] [address]   Brain natriuretic peptide     Status: Abnormal   Collection Time: 01/15/23  4:58 PM  Result Value Ref Range   B Natriuretic Peptide 2,619.5 (H) 0.0 - 100.0 pg/mL    Comment: Performed at [facility], [address] [place], [place] [address]  Lactic acid, plasma     Status: None   Collection Time: 01/15/23  5:20 PM  Result Value Ref Range   Lactic Acid, Venous 1.7 0.5 - 1.9 mmol/L    Comment: Performed at [facility], [address] [place], [place] [address]  Troponin I (High Sensitivity)     Status: Abnormal   Collection Time: 01/15/23  7:17 PM  Result Value Ref Range   Troponin I (High Sensitivity) 69 (H) <18 ng/L    Comment: (NOTE) Elevated high sensitivity troponin I (hsTnI) values and significant  changes across serial measurements may suggest ACS but many other  chronic and acute conditions are known to elevate hsTnI results.  Refer to the "Links" section for chest pain algorithms and additional  guidance. Performed at [facility], [address], [place], [place] [address]   Bilirubin, direct     Status: Abnormal   Collection Time: 01/15/23  7:17 PM  Result Value Ref Range   Bilirubin, Direct 3.9 (H) 0.0 - 0.2 mg/dL    Comment: Performed at [facility], [address]  [place], [place] [address]  Resp panel by RT-PCR (RSV, Flu A&B, Covid) Anterior Nasal Swab     Status: None   Collection Time: 01/15/23  9:53 PM   Specimen: Anterior Nasal Swab  Result Value Ref Range   SARS Coronavirus 2 by RT PCR NEGATIVE NEGATIVE    Comment: (NOTE) SARS-CoV-2 target nucleic acids are NOT DETECTED.  The SARS-CoV-2 RNA is generally detectable in upper respiratory specimens during the acute phase of infection. The lowest concentration of SARS-CoV-2 viral copies this assay can detect is 138 copies/mL. A negative result does not preclude SARS-Cov-2 infection and should not be used as the sole basis for treatment or other patient management decisions. A negative result may occur with  improper specimen collection/handling, submission of specimen other than nasopharyngeal swab, presence of viral mutation(s) within the areas targeted by this assay, and inadequate number of viral copies(<138 copies/mL). A negative result must be combined with clinical observations, patient history, and epidemiological information. The expected result is Negative.  Fact Sheet for Patients:  BloggerCourse.com  Fact Sheet for Healthcare Providers:  SeriousBroker.it  This test is no t yet approved or cleared by the [place] FDA and  has been authorized for detection and/or diagnosis of SARS-CoV-2 by FDA under an Emergency Use Authorization (EUA). This EUA will remain  in effect (meaning this test can be used) for the duration of the  COVID-19 declaration under Section 564(b)(1) of the Act, 21 U.S.C.section 360bbb-3(b)(1), unless the authorization is terminated  or revoked sooner.       Influenza A by PCR NEGATIVE NEGATIVE   Influenza B by PCR NEGATIVE NEGATIVE    Comment: (NOTE) The Xpert Xpress SARS-CoV-2/FLU/RSV plus assay is intended as an aid in the diagnosis of influenza from Nasopharyngeal swab specimens and should not be used as a  sole basis for treatment. Nasal washings and aspirates are unacceptable for Xpert Xpress SARS-CoV-2/FLU/RSV testing.  Fact Sheet for Patients: BloggerCourse.com  Fact Sheet for Healthcare Providers: SeriousBroker.it  This test is not yet approved or cleared by the [place] FDA and has been authorized for detection and/or diagnosis of SARS-CoV-2 by FDA under an Emergency Use Authorization (EUA). This EUA will remain in effect (meaning this test can be used) for the duration of the COVID-19 declaration under Section 564(b)(1) of the Act, 21 U.S.C. section 360bbb-3(b)(1), unless the authorization is terminated or revoked.     Resp Syncytial Virus by PCR NEGATIVE NEGATIVE    Comment: (NOTE) Fact Sheet for Patients: BloggerCourse.com  Fact Sheet for Healthcare Providers: SeriousBroker.it  This test is not yet approved or cleared by the [place] FDA and has been authorized for detection and/or diagnosis of SARS-CoV-2 by FDA under an Emergency Use Authorization (EUA). This EUA will remain in effect (meaning this test can be used) for the duration of the COVID-19 declaration under Section 564(b)(1) of the Act, 21 U.S.C. section 360bbb-3(b)(1), unless the authorization is terminated or revoked.  Performed at [facility], [address] [place], [place] [address]    DG Chest 2 View  Result Date: 01/15/2023 CLINICAL DATA:  Shortness of breath after COVID vaccination. EXAM: CHEST - 2 VIEW COMPARISON:  Chest radiograph 11/12/2022. Lung bases from concurrent abdominopelvic CT. FINDINGS: Moderate right pleural effusion, increased from prior exam. There is a small left pleural effusion, improved from September exam. Heart is upper normal in size. Interstitial thickening may represent edema or be chronic, and is similar to prior exam. No pneumothorax. IMPRESSION:  1. Moderate right pleural effusion, increased from prior exam. Small left pleural effusion, improved from September exam. 2. Interstitial thickening may represent edema or be chronic. Electronically Signed   By: Narda Rutherford M.D.   On: 01/15/2023 19:51   CT ABDOMEN PELVIS WO CONTRAST  Result Date: 01/15/2023 CLINICAL DATA:  Acute abdominal pain.  Diarrhea. EXAM: CT ABDOMEN AND PELVIS WITHOUT CONTRAST TECHNIQUE: Multidetector CT imaging of the abdomen and pelvis was performed following the standard protocol without IV contrast. RADIATION DOSE REDUCTION: This exam was performed according to the departmental dose-optimization program which includes automated exposure control, adjustment of the mA and/or kV according to patient size and/or use of iterative reconstruction technique. COMPARISON:  None Available. FINDINGS: Lower chest: Moderate right and small left pleural effusion. Associated compressive atelectasis in the right lower, right middle, and left lower lobes. The heart is upper normal in size, there is left ventricular dilatation. Hepatobiliary: Nodular hepatic contours typical of cirrhosis. No evidence of focal liver abnormality on this unenhanced exam. Small gallstones within the gallbladder. No biliary dilatation. Pancreas: No ductal dilatation or inflammation. Spleen: No splenomegaly. Adrenals/Urinary Tract: No adrenal nodule. Mild bilateral renal parenchymal atrophy. No hydronephrosis. No renal calculi. Urinary bladder is minimally distended, grossly normal for degree of distension. Stomach/Bowel: Detailed bowel assessment is limited due to the presence of ascites and lack of contrast. Equivocal gastric wall thickening. Equivocal duodenal wall thickening. There  are duodenal diverticula. No small bowel distension. Normal appendix. Multifocal colonic diverticulosis without evidence of focal diverticulitis. There is colonic wall thickening from the transverse colon through the distal descending  colon. Small volume of formed colonic stool. Vascular/Lymphatic: Aortic and branch atherosclerosis. Retroaortic left renal vein. Suspected recannulated umbilical vein, not well assessed in the absence of IV contrast. There is no obvious bulky abdominopelvic adenopathy, although ascites limits detailed assessment. Reproductive: The uterus is not seen, presumed hysterectomy. No evidence of adnexal mass. Other: Moderate to large volume abdominopelvic ascites. Ascitic fluid is simple in density. Generalized edema of the subcutaneous and intra-abdominal fat. No free air. Musculoskeletal: Mild scoliosis and degenerative change in the spine. There are no acute or suspicious osseous abnormalities. IMPRESSION: 1. 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 2. Colonic wall thickening from the transverse colon through the distal descending colon, suspicious for colitis. This may be infectious or inflammatory. Distribution is not typical for portal colopathy. 3. Colonic diverticulosis without evidence of focal diverticulitis. 4. Cholelithiasis. 5. Moderate right and small left pleural effusions with associated compressive atelectasis. Aortic Atherosclerosis (ICD10-I70.0). Electronically Signed   By: Narda Rutherford M.D.   On: 01/15/2023 19:50    Pending Labs Unresulted Labs (From admission, onward)     Start     Ordered   01/15/23 2141  C Difficile Quick Screen w PCR reflex  (C Difficile quick screen w PCR reflex panel )  Once, for 24 hours,   URGENT       References:    CDiff Information Tool   01/15/23 2141   01/15/23 2141  Gastrointestinal Panel by PCR , Stool  (Gastrointestinal Panel by PCR, Stool                                                                                                                                                     **Does Not include CLOSTRIDIUM DIFFICILE testing. **If CDIFF testing is needed, place order from the "C Difficile Testing" order set.**)  Once,    URGENT        01/15/23 2141            Vitals/Pain Today's Vitals   01/15/23 2015 01/15/23 2100 01/15/23 2149 01/15/23 2330  BP: (!) 113/56 (!) 108/53  (!) 111/51  Pulse: (!) 104 (!) 107  (!) 109  Resp: 14 16  19   Temp:    98.2 F (36.8 C)  TempSrc:      SpO2: 96% 98%  92%  Weight:      Height:      PainSc:   2      Isolation Precautions Enteric precautions (UV disinfection)  Medications Medications  sodium chloride 0.9 % bolus 500 mL (0 mLs Intravenous Stopped 01/15/23 1917)  fentaNYL (SUBLIMAZE) injection 25 mcg (25 mcg Intravenous Given 01/15/23 2133)  Mobility walks with person assist     Focused Assessments Cardiac Assessment Handoff:  Cardiac Rhythm: Sinus tachycardia Lab Results  Component Value Date   CKTOTAL 81 01/19/2015   No results found for: "DDIMER" Does the Patient currently have chest pain? No    R Recommendations: See Admitting Provider Note  Report given to:   Additional Notes: pt is here for diarrhea and swelling to abdomen and legs. Has not been taking her torsemide like she was supposed to due to low blood pressure. A&O x4, is on 2 ltrs of oxygen to help with shortness of breath.

## 2023-01-15 NOTE — ED Notes (Signed)
Transported to CT 

## 2023-01-15 NOTE — ED Notes (Signed)
Placed on 2LPM via [place] for comfort. 93% RA.

## 2023-01-15 NOTE — ED Notes (Signed)
Called report to [phone].

## 2023-01-15 NOTE — ED Notes (Signed)
Called [facility] to transport patient to [facility] Rm# [phone]

## 2023-01-15 NOTE — ED Notes (Signed)
Pt made aware of need for stool sample, pt attempted to give sample, unable to give.

## 2023-01-16 ENCOUNTER — Encounter (HOSPITAL_COMMUNITY): Payer: Self-pay | Admitting: Internal Medicine

## 2023-01-16 ENCOUNTER — Inpatient Hospital Stay (HOSPITAL_COMMUNITY): Payer: Medicare HMO

## 2023-01-16 DIAGNOSIS — E039 Hypothyroidism, unspecified: Secondary | ICD-10-CM

## 2023-01-16 DIAGNOSIS — I5023 Acute on chronic systolic (congestive) heart failure: Secondary | ICD-10-CM | POA: Diagnosis present

## 2023-01-16 DIAGNOSIS — D693 Immune thrombocytopenic purpura: Secondary | ICD-10-CM | POA: Diagnosis present

## 2023-01-16 DIAGNOSIS — I5021 Acute systolic (congestive) heart failure: Secondary | ICD-10-CM | POA: Diagnosis not present

## 2023-01-16 DIAGNOSIS — M109 Gout, unspecified: Secondary | ICD-10-CM | POA: Insufficient documentation

## 2023-01-16 DIAGNOSIS — I130 Hypertensive heart and chronic kidney disease with heart failure and stage 1 through stage 4 chronic kidney disease, or unspecified chronic kidney disease: Secondary | ICD-10-CM | POA: Diagnosis present

## 2023-01-16 DIAGNOSIS — R64 Cachexia: Secondary | ICD-10-CM | POA: Diagnosis present

## 2023-01-16 DIAGNOSIS — N184 Chronic kidney disease, stage 4 (severe): Secondary | ICD-10-CM

## 2023-01-16 DIAGNOSIS — Z794 Long term (current) use of insulin: Secondary | ICD-10-CM

## 2023-01-16 DIAGNOSIS — R0602 Shortness of breath: Secondary | ICD-10-CM | POA: Diagnosis present

## 2023-01-16 DIAGNOSIS — Z7189 Other specified counseling: Secondary | ICD-10-CM | POA: Diagnosis not present

## 2023-01-16 DIAGNOSIS — J90 Pleural effusion, not elsewhere classified: Secondary | ICD-10-CM

## 2023-01-16 DIAGNOSIS — K7460 Unspecified cirrhosis of liver: Secondary | ICD-10-CM | POA: Diagnosis not present

## 2023-01-16 DIAGNOSIS — R7401 Elevation of levels of liver transaminase levels: Secondary | ICD-10-CM

## 2023-01-16 DIAGNOSIS — N179 Acute kidney failure, unspecified: Secondary | ICD-10-CM | POA: Diagnosis present

## 2023-01-16 DIAGNOSIS — J9611 Chronic respiratory failure with hypoxia: Secondary | ICD-10-CM

## 2023-01-16 DIAGNOSIS — I340 Nonrheumatic mitral (valve) insufficiency: Secondary | ICD-10-CM | POA: Diagnosis present

## 2023-01-16 DIAGNOSIS — R7989 Other specified abnormal findings of blood chemistry: Secondary | ICD-10-CM | POA: Insufficient documentation

## 2023-01-16 DIAGNOSIS — K529 Noninfective gastroenteritis and colitis, unspecified: Secondary | ICD-10-CM

## 2023-01-16 DIAGNOSIS — J9811 Atelectasis: Secondary | ICD-10-CM | POA: Diagnosis present

## 2023-01-16 DIAGNOSIS — I509 Heart failure, unspecified: Secondary | ICD-10-CM | POA: Diagnosis not present

## 2023-01-16 DIAGNOSIS — E119 Type 2 diabetes mellitus without complications: Secondary | ICD-10-CM

## 2023-01-16 DIAGNOSIS — R188 Other ascites: Secondary | ICD-10-CM

## 2023-01-16 DIAGNOSIS — K7682 Hepatic encephalopathy: Secondary | ICD-10-CM | POA: Diagnosis not present

## 2023-01-16 DIAGNOSIS — E871 Hypo-osmolality and hyponatremia: Secondary | ICD-10-CM

## 2023-01-16 DIAGNOSIS — K7290 Hepatic failure, unspecified without coma: Secondary | ICD-10-CM | POA: Diagnosis not present

## 2023-01-16 DIAGNOSIS — I8510 Secondary esophageal varices without bleeding: Secondary | ICD-10-CM | POA: Diagnosis present

## 2023-01-16 DIAGNOSIS — K761 Chronic passive congestion of liver: Secondary | ICD-10-CM | POA: Diagnosis present

## 2023-01-16 DIAGNOSIS — K767 Hepatorenal syndrome: Secondary | ICD-10-CM | POA: Diagnosis not present

## 2023-01-16 DIAGNOSIS — D649 Anemia, unspecified: Secondary | ICD-10-CM

## 2023-01-16 DIAGNOSIS — E1122 Type 2 diabetes mellitus with diabetic chronic kidney disease: Secondary | ICD-10-CM | POA: Diagnosis present

## 2023-01-16 DIAGNOSIS — I21A1 Myocardial infarction type 2: Secondary | ICD-10-CM | POA: Diagnosis present

## 2023-01-16 DIAGNOSIS — Z66 Do not resuscitate: Secondary | ICD-10-CM | POA: Diagnosis present

## 2023-01-16 DIAGNOSIS — D631 Anemia in chronic kidney disease: Secondary | ICD-10-CM | POA: Diagnosis present

## 2023-01-16 DIAGNOSIS — Z1152 Encounter for screening for COVID-19: Secondary | ICD-10-CM | POA: Diagnosis not present

## 2023-01-16 DIAGNOSIS — K766 Portal hypertension: Secondary | ICD-10-CM | POA: Diagnosis present

## 2023-01-16 DIAGNOSIS — Z515 Encounter for palliative care: Secondary | ICD-10-CM | POA: Diagnosis not present

## 2023-01-16 DIAGNOSIS — E8809 Other disorders of plasma-protein metabolism, not elsewhere classified: Secondary | ICD-10-CM | POA: Diagnosis present

## 2023-01-16 DIAGNOSIS — I255 Ischemic cardiomyopathy: Secondary | ICD-10-CM | POA: Insufficient documentation

## 2023-01-16 HISTORY — PX: IR PARACENTESIS: IMG2679

## 2023-01-16 LAB — CBC
HCT: 27 % — ABNORMAL LOW (ref 36.0–46.0)
Hemoglobin: 9.7 g/dL — ABNORMAL LOW (ref 12.0–15.0)
MCH: 35.3 pg — ABNORMAL HIGH (ref 26.0–34.0)
MCHC: 35.9 g/dL (ref 30.0–36.0)
MCV: 98.2 fL (ref 80.0–100.0)
Platelets: 135 10*3/uL — ABNORMAL LOW (ref 150–400)
RBC: 2.75 MIL/uL — ABNORMAL LOW (ref 3.87–5.11)
RDW: 18.6 % — ABNORMAL HIGH (ref 11.5–15.5)
WBC: 8.2 10*3/uL (ref 4.0–10.5)
nRBC: 0 % (ref 0.0–0.2)

## 2023-01-16 LAB — ECHOCARDIOGRAM LIMITED
Height: 65 in
MV M vel: 4.16 m/s
MV Peak grad: 69.2 mm[Hg]
Radius: 0.5 cm
S' Lateral: 4.7 cm
Single Plane A4C EF: 32.7 %
Weight: 2006.4 [oz_av]

## 2023-01-16 LAB — IRON AND TIBC
Iron: 84 ug/dL (ref 28–170)
Saturation Ratios: 52 % — ABNORMAL HIGH (ref 10.4–31.8)
TIBC: 162 ug/dL — ABNORMAL LOW (ref 250–450)
UIBC: 78 ug/dL

## 2023-01-16 LAB — COMPREHENSIVE METABOLIC PANEL
ALT: 16 U/L (ref 0–44)
AST: 43 U/L — ABNORMAL HIGH (ref 15–41)
Albumin: 2.5 g/dL — ABNORMAL LOW (ref 3.5–5.0)
Alkaline Phosphatase: 40 U/L (ref 38–126)
Anion gap: 12 (ref 5–15)
BUN: 29 mg/dL — ABNORMAL HIGH (ref 8–23)
CO2: 24 mmol/L (ref 22–32)
Calcium: 9.3 mg/dL (ref 8.9–10.3)
Chloride: 94 mmol/L — ABNORMAL LOW (ref 98–111)
Creatinine, Ser: 2.52 mg/dL — ABNORMAL HIGH (ref 0.44–1.00)
GFR, Estimated: 19 mL/min — ABNORMAL LOW (ref >60)
Glucose, Bld: 108 mg/dL — ABNORMAL HIGH (ref 70–99)
Potassium: 3.4 mmol/L — ABNORMAL LOW (ref 3.5–5.1)
Sodium: 130 mmol/L — ABNORMAL LOW (ref 135–145)
Total Bilirubin: 7.7 mg/dL — ABNORMAL HIGH (ref <1.2)
Total Protein: 6.2 g/dL — ABNORMAL LOW (ref 6.5–8.1)

## 2023-01-16 LAB — LACTATE DEHYDROGENASE, PLEURAL OR PERITONEAL FLUID: LD, Fluid: 31 U/L — ABNORMAL HIGH (ref 3–23)

## 2023-01-16 LAB — GLUCOSE, CAPILLARY
Glucose-Capillary: 124 mg/dL — ABNORMAL HIGH (ref 70–99)
Glucose-Capillary: 97 mg/dL (ref 70–99)
Glucose-Capillary: 97 mg/dL (ref 70–99)
Glucose-Capillary: 161 mg/dL — ABNORMAL HIGH (ref 70–99)

## 2023-01-16 LAB — HEPATITIS PANEL, ACUTE
HCV Ab: NONREACTIVE
Hep A IgM: NONREACTIVE
Hep B C IgM: NONREACTIVE
Hepatitis B Surface Ag: NONREACTIVE

## 2023-01-16 LAB — URINALYSIS, W/ REFLEX TO CULTURE (INFECTION SUSPECTED)
Bacteria, UA: NONE SEEN
Bilirubin Urine: NEGATIVE
Glucose, UA: NEGATIVE mg/dL
Hgb urine dipstick: NEGATIVE
Ketones, ur: NEGATIVE mg/dL
Leukocytes,Ua: NEGATIVE
Nitrite: NEGATIVE
Protein, ur: NEGATIVE mg/dL
Specific Gravity, Urine: 1.008 (ref 1.005–1.030)
pH: 5 (ref 5.0–8.0)

## 2023-01-16 LAB — BODY FLUID CELL COUNT WITH DIFFERENTIAL
Eos, Fluid: 0 %
Lymphs, Fluid: 53 %
Monocyte-Macrophage-Serous Fluid: 25 % — ABNORMAL LOW (ref 50–90)
Neutrophil Count, Fluid: 22 % (ref 0–25)
Total Nucleated Cell Count, Fluid: 44 uL (ref 0–1000)

## 2023-01-16 LAB — CREATININE, URINE, RANDOM: Creatinine, Urine: 40 mg/dL

## 2023-01-16 LAB — GRAM STAIN

## 2023-01-16 LAB — ALBUMIN, PLEURAL OR PERITONEAL FLUID: Albumin, Fluid: <1.5 g/dL

## 2023-01-16 LAB — PROTEIN, PLEURAL OR PERITONEAL FLUID: Total protein, fluid: <3 g/dL

## 2023-01-16 LAB — SODIUM, URINE, RANDOM: Sodium, Ur: 43 mmol/L

## 2023-01-16 LAB — FERRITIN: Ferritin: 493 ng/mL — ABNORMAL HIGH (ref 11–307)

## 2023-01-16 LAB — APTT: aPTT: 36 s (ref 24–36)

## 2023-01-16 LAB — PROTIME-INR
INR: 1.6 — ABNORMAL HIGH (ref 0.8–1.2)
Prothrombin Time: 19.7 s — ABNORMAL HIGH (ref 11.4–15.2)

## 2023-01-16 LAB — TROPONIN I (HIGH SENSITIVITY)
Troponin I (High Sensitivity): 83 ng/L — ABNORMAL HIGH (ref <18)
Troponin I (High Sensitivity): 89 ng/L — ABNORMAL HIGH (ref <18)

## 2023-01-16 LAB — AMMONIA: Ammonia: 81 umol/L — ABNORMAL HIGH (ref 9–35)

## 2023-01-16 MED ORDER — ALBUMIN HUMAN 25 % IV SOLN
50.0000 g | Freq: Once | INTRAVENOUS | Status: AC
  Administered 2023-01-16: 50 g via INTRAVENOUS
  Filled 2023-01-16: qty 200

## 2023-01-16 MED ORDER — HYDROCODONE-ACETAMINOPHEN 5-325 MG PO TABS
1.0000 | ORAL_TABLET | Freq: Four times a day (QID) | ORAL | Status: DC | PRN
  Administered 2023-01-17 – 2023-01-20 (×4): 1 via ORAL
  Filled 2023-01-16 (×4): qty 1

## 2023-01-16 MED ORDER — POTASSIUM CHLORIDE CRYS ER 20 MEQ PO TBCR
40.0000 meq | EXTENDED_RELEASE_TABLET | Freq: Two times a day (BID) | ORAL | Status: DC
  Administered 2023-01-16: 40 meq via ORAL
  Filled 2023-01-16: qty 2

## 2023-01-16 MED ORDER — POTASSIUM CHLORIDE 20 MEQ PO PACK
20.0000 meq | PACK | Freq: Once | ORAL | Status: AC
  Administered 2023-01-17: 20 meq via ORAL
  Filled 2023-01-16: qty 1

## 2023-01-16 MED ORDER — FENTANYL CITRATE PF 50 MCG/ML IJ SOSY
25.0000 ug | PREFILLED_SYRINGE | Freq: Once | INTRAMUSCULAR | Status: AC
  Administered 2023-01-16: 25 ug via INTRAVENOUS

## 2023-01-16 MED ORDER — FUROSEMIDE 10 MG/ML IJ SOLN
20.0000 mg | Freq: Two times a day (BID) | INTRAMUSCULAR | Status: DC
  Administered 2023-01-16: 20 mg via INTRAVENOUS
  Filled 2023-01-16: qty 2

## 2023-01-16 MED ORDER — SENNOSIDES-DOCUSATE SODIUM 8.6-50 MG PO TABS: 1.0000 | ORAL_TABLET | Freq: Every evening | ORAL | Status: DC | PRN

## 2023-01-16 MED ORDER — SODIUM CHLORIDE 0.9% FLUSH
3.0000 mL | Freq: Two times a day (BID) | INTRAVENOUS | Status: DC
Start: 2023-01-16 — End: 2023-01-16
  Administered 2023-01-16: 3 mL via INTRAVENOUS

## 2023-01-16 MED ORDER — ENSURE ENLIVE PO LIQD
237.0000 mL | Freq: Two times a day (BID) | ORAL | Status: DC
  Administered 2023-01-16 – 2023-01-19 (×5): 237 mL via ORAL

## 2023-01-16 MED ORDER — SODIUM CHLORIDE 0.9% FLUSH: 3.0000 mL | INTRAVENOUS | Status: DC | PRN

## 2023-01-16 MED ORDER — SODIUM CHLORIDE 0.9 % IV SOLN
12.5000 mg | Freq: Once | INTRAVENOUS | Status: AC
  Administered 2023-01-16: 12.5 mg via INTRAVENOUS
  Filled 2023-01-16: qty 12.5

## 2023-01-16 MED ORDER — ALBUMIN HUMAN 25 % IV SOLN
50.0000 g | Freq: Once | INTRAVENOUS | Status: AC
  Administered 2023-01-17: 12.5 g via INTRAVENOUS
  Filled 2023-01-16: qty 200

## 2023-01-16 MED ORDER — ONDANSETRON HCL 4 MG PO TABS: 4.0000 mg | ORAL_TABLET | Freq: Four times a day (QID) | ORAL | Status: DC | PRN

## 2023-01-16 MED ORDER — ACETAMINOPHEN 325 MG PO TABS
650.0000 mg | ORAL_TABLET | Freq: Four times a day (QID) | ORAL | Status: DC | PRN
  Filled 2023-01-16 (×3): qty 2

## 2023-01-16 MED ORDER — POTASSIUM CHLORIDE 10 MEQ/100ML IV SOLN
10.0000 meq | INTRAVENOUS | Status: DC
  Administered 2023-01-16 (×2): 10 meq via INTRAVENOUS
  Filled 2023-01-16 (×3): qty 100

## 2023-01-16 MED ORDER — SODIUM CHLORIDE 0.9 % IV SOLN
2.0000 g | INTRAVENOUS | Status: DC
  Administered 2023-01-16 – 2023-01-17 (×2): 2 g via INTRAVENOUS
  Filled 2023-01-16 (×2): qty 20

## 2023-01-16 MED ORDER — PANTOPRAZOLE SODIUM 40 MG PO TBEC
40.0000 mg | DELAYED_RELEASE_TABLET | Freq: Every day | ORAL | Status: DC
  Administered 2023-01-17 – 2023-01-20 (×4): 40 mg via ORAL
  Filled 2023-01-16 (×5): qty 1

## 2023-01-16 MED ORDER — ALUM & MAG HYDROXIDE-SIMETH 200-200-20 MG/5ML PO SUSP
15.0000 mL | Freq: Four times a day (QID) | ORAL | Status: DC | PRN
  Filled 2023-01-16: qty 30

## 2023-01-16 MED ORDER — FENTANYL CITRATE PF 50 MCG/ML IJ SOSY
PREFILLED_SYRINGE | INTRAMUSCULAR | Status: AC
  Filled 2023-01-16: qty 1

## 2023-01-16 MED ORDER — LEVOTHYROXINE SODIUM 88 MCG PO TABS
88.0000 ug | ORAL_TABLET | Freq: Every day | ORAL | Status: DC
  Administered 2023-01-16 – 2023-01-20 (×5): 88 ug via ORAL
  Filled 2023-01-16 (×5): qty 1

## 2023-01-16 MED ORDER — LIDOCAINE HCL 1 % IJ SOLN
20.0000 mL | Freq: Once | INTRAMUSCULAR | Status: AC
  Administered 2023-01-16: 20 mL

## 2023-01-16 MED ORDER — FUROSEMIDE 10 MG/ML IJ SOLN
40.0000 mg | Freq: Two times a day (BID) | INTRAMUSCULAR | Status: DC
  Administered 2023-01-16 – 2023-01-17 (×3): 40 mg via INTRAVENOUS
  Filled 2023-01-16 (×3): qty 4

## 2023-01-16 MED ORDER — ACETAMINOPHEN 650 MG RE SUPP: 650.0000 mg | Freq: Four times a day (QID) | RECTAL | Status: DC | PRN

## 2023-01-16 MED ORDER — ONDANSETRON HCL 4 MG/2ML IJ SOLN
4.0000 mg | Freq: Four times a day (QID) | INTRAMUSCULAR | Status: DC | PRN
Start: 2023-01-16 — End: 2023-01-21
  Administered 2023-01-16 – 2023-01-20 (×2): 4 mg via INTRAVENOUS
  Filled 2023-01-16 (×2): qty 2

## 2023-01-16 MED ORDER — POTASSIUM CHLORIDE 10 MEQ/100ML IV SOLN
10.0000 meq | INTRAVENOUS | Status: DC
  Administered 2023-01-16 (×2): 10 meq via INTRAVENOUS
  Filled 2023-01-16: qty 100

## 2023-01-16 MED ORDER — SODIUM CHLORIDE 0.9 % IV SOLN
250.0000 mL | INTRAVENOUS | Status: DC | PRN
Start: 2023-01-16 — End: 2023-01-16

## 2023-01-16 MED ORDER — LIDOCAINE HCL 1 % IJ SOLN
INTRAMUSCULAR | Status: AC
  Filled 2023-01-16: qty 20

## 2023-01-16 MED ORDER — FUROSEMIDE 10 MG/ML IJ SOLN
80.0000 mg | Freq: Once | INTRAMUSCULAR | Status: AC
  Administered 2023-01-17: 80 mg via INTRAVENOUS
  Filled 2023-01-16: qty 8

## 2023-01-16 MED ORDER — METRONIDAZOLE 500 MG/100ML IV SOLN
500.0000 mg | Freq: Two times a day (BID) | INTRAVENOUS | Status: DC
  Administered 2023-01-16 – 2023-01-17 (×3): 500 mg via INTRAVENOUS
  Filled 2023-01-16 (×3): qty 100

## 2023-01-16 MED ORDER — FUROSEMIDE 10 MG/ML IJ SOLN: 40.0000 mg | Freq: Two times a day (BID) | INTRAMUSCULAR | Status: DC

## 2023-01-16 MED ORDER — METOPROLOL SUCCINATE ER 25 MG PO TB24
12.5000 mg | ORAL_TABLET | Freq: Every day | ORAL | Status: DC
  Administered 2023-01-17 – 2023-01-20 (×4): 12.5 mg via ORAL
  Filled 2023-01-16 (×6): qty 1

## 2023-01-16 NOTE — Progress Notes (Signed)
PROGRESS NOTE    Jessica Torres  VOZ:[id number] DOB: Jan 28, 1941 DOA: 01/15/2023 PCP: Rebekah Chesterfield, NP  Chronically ill 82/F with history of systolic CHF, EF 42-59%, severe MR, CKD 4, liver cirrhosis, portal hypertension, esophageal varices, presumed CAD, chronic respiratory failure on 2 L O2, type 2 diabetes mellitus, history of chronic blood loss anemia and GAVE's disease, history of CVA, hypothyroidism with multiple hospitalizations for heart failure presented to the ED 11/7 with progressive shortness of breath, abdominal swelling, generalized weakness and some diarrhea. -In the ED she was tachycardic, blood pressure 150/62, lactate 1.9, labs with sodium 131, creatinine 2.5, albumin 3.2, hemoglobin 11.3, WBC 8.9, troponin 78, 69, bili 3.9, BNP 2619, chest x-ray with moderate right pleural effusion and interstitial thickening?  Edema CT abdomen pelvis with cirrhosis, moderate to large volume ascites, colon wall thickening?  Colitis, moderate right and small left effusions   Subjective: -Feels terrible, short of breath, abdominal distention  Assessment and Plan:  Acute on chronic systolic CHF Severe mitral regurgitation, suspected CAD -Worsened in the background of decompensated cirrhosis and CKD 4 -Seventh hospitalization this year with volume overload, prognosis remains poor -Continue IV Lasix today, metoprolol -GDMT limited by CKD 4 -Seen by palliative care few months ago, she declined hospice services then, remains DNR  Decompensated liver cirrhosis, ascites History of esophageal varices -Ultrasound paracentesis per IR today, IV albumin -Continue empiric IV ceftriaxone awaiting ascitic fluid eval -She does not need to be n.p.o. for paracentesis, will resume diet -Also check ammonia level  AKI on CKD 4 Hyponatremia -Baseline creatinine is around 2 -Now 2.5, likely cardiorenal -Remains significantly volume overloaded, diuretics as above, avoid hypotension  Colon wall  thickening involving transverse and distal colon -Clinical suspicion for colitis is low, C. difficile and GI pathogen panel ordered by admitting MD will follow up -Suspect some of the colon wall thickening is related to her ascites -Discontinue antibiotics if SBP eval is negative  Chronic anemia, thrombocytopenia Relatively stable monitor  Type 2 diabetes mellitus - HbA1c was 5.4 in September, on glargine at baseline, SSI for now  Chronic hypoxic respiratory failure on 2 L home O2 at baseline, stable  Hypothyroidism  -continue Synthroid  DVT prophylaxis: SCDs Code Status: DNR  Family Communication: No family at bedside, will update son later today Disposition Plan: To be determined  Consultants:    Procedures:   Antimicrobials:    Objective: Vitals:   01/16/23 0145 01/16/23 0235 01/16/23 0504 01/16/23 0725  BP: 121/63 (!) 117/58 (!) 114/54 (!) 108/56  Pulse: (!) 117 (!) 109 (!) 109 (!) 107  Resp: 20 20 20 17   Temp: 98.1 F (36.7 C) 97.8 F (36.6 C) 97.8 F (36.6 C) 98 F (36.7 C)  TempSrc:  Oral Oral Oral  SpO2: 96% 99% 99% 98%  Weight:      Height:        Intake/Output Summary (Last 24 hours) at 01/16/2023 5638 Last data filed at 01/15/2023 2209 Gross per 24 hour  Intake 500 ml  Output 225 ml  Net 275 ml   Filed Weights   01/15/23 1546  Weight: 56.9 kg    Examination:  General exam: Chronically ill cachectic female laying in bed, AO x 3 HEENT: Positive JVD CVS: S1-S2, regular rhythm Lungs: Decreased breath sound at the bases Abdomen: Soft, distended, nontender, bowel sounds present Extremities: Positive edema  Psychiatry: Flat affect    Data Reviewed:   CBC: Recent Labs  Lab 01/15/23 1552 01/16/23 0455  WBC 8.9  8.2  NEUTROABS 6.4  --   HGB 11.3* 9.7*  HCT 31.3* 27.0*  MCV 97.5 98.2  PLT 151 135*   Basic Metabolic Panel: Recent Labs  Lab 01/15/23 1552 01/16/23 0455  NA 131* 130*  K 4.2 3.4*  CL 94* 94*  CO2 26 24  GLUCOSE 171*  108*  BUN 31* 29*  CREATININE 2.52* 2.52*  CALCIUM 10.2 9.3   GFR: Estimated Creatinine Clearance: 16 mL/min (A) (by C-G formula based on SCr of 2.52 mg/dL (H)). Liver Function Tests: Recent Labs  Lab 01/15/23 1552 01/16/23 0455  AST 46* 43*  ALT 13 16  ALKPHOS 46 40  BILITOT 8.7* 7.7*  PROT 7.1 6.2*  ALBUMIN 3.2* 2.5*   Recent Labs  Lab 01/15/23 1552  LIPASE 47   Recent Labs  Lab 01/16/23 0803  AMMONIA 81*   Coagulation Profile: Recent Labs  Lab 01/16/23 0455  INR 1.6*   Cardiac Enzymes: No results for input(s): "CKTOTAL", "CKMB", "CKMBINDEX", "TROPONINI" in the last 168 hours. BNP (last 3 results) No results for input(s): "PROBNP" in the last 8760 hours. HbA1C: No results for input(s): "HGBA1C" in the last 72 hours. CBG: Recent Labs  Lab 01/16/23 0234 01/16/23 0725  GLUCAP 124* 97   Lipid Profile: No results for input(s): "CHOL", "HDL", "LDLCALC", "TRIG", "CHOLHDL", "LDLDIRECT" in the last 72 hours. Thyroid Function Tests: No results for input(s): "TSH", "T4TOTAL", "FREET4", "T3FREE", "THYROIDAB" in the last 72 hours. Anemia Panel: No results for input(s): "VITAMINB12", "FOLATE", "FERRITIN", "TIBC", "IRON", "RETICCTPCT" in the last 72 hours. Urine analysis:    Component Value Date/Time   COLORURINE YELLOW 01/16/2023 0506   APPEARANCEUR CLEAR 01/16/2023 0506   APPEARANCEUR Clear 10/28/2019 0900   LABSPEC 1.008 01/16/2023 0506   PHURINE 5.0 01/16/2023 0506   GLUCOSEU NEGATIVE 01/16/2023 0506   HGBUR NEGATIVE 01/16/2023 0506   BILIRUBINUR NEGATIVE 01/16/2023 0506   BILIRUBINUR Negative 10/28/2019 0900   KETONESUR NEGATIVE 01/16/2023 0506   PROTEINUR NEGATIVE 01/16/2023 0506   NITRITE NEGATIVE 01/16/2023 0506   LEUKOCYTESUR NEGATIVE 01/16/2023 0506   Sepsis Labs: @LABRCNTIP (procalcitonin:4,lacticidven:4)  ) Recent Results (from the past 240 hour(s))  Resp panel by RT-PCR (RSV, Flu A&B, Covid) Anterior Nasal Swab     Status: None   Collection  Time: 01/15/23  9:53 PM   Specimen: Anterior Nasal Swab  Result Value Ref Range Status   SARS Coronavirus 2 by RT PCR NEGATIVE NEGATIVE Final    Comment: (NOTE) SARS-CoV-2 target nucleic acids are NOT DETECTED.  The SARS-CoV-2 RNA is generally detectable in upper respiratory specimens during the acute phase of infection. The lowest concentration of SARS-CoV-2 viral copies this assay can detect is 138 copies/mL. A negative result does not preclude SARS-Cov-2 infection and should not be used as the sole basis for treatment or other patient management decisions. A negative result may occur with  improper specimen collection/handling, submission of specimen other than nasopharyngeal swab, presence of viral mutation(s) within the areas targeted by this assay, and inadequate number of viral copies(<138 copies/mL). A negative result must be combined with clinical observations, patient history, and epidemiological information. The expected result is Negative.  Fact Sheet for Patients:  BloggerCourse.com  Fact Sheet for Healthcare Providers:  SeriousBroker.it  This test is no t yet approved or cleared by the [place] FDA and  has been authorized for detection and/or diagnosis of SARS-CoV-2 by FDA under an Emergency Use Authorization (EUA). This EUA will remain  in effect (meaning this test can be used) for  the duration of the COVID-19 declaration under Section 564(b)(1) of the Act, 21 U.S.C.section 360bbb-3(b)(1), unless the authorization is terminated  or revoked sooner.       Influenza A by PCR NEGATIVE NEGATIVE Final   Influenza B by PCR NEGATIVE NEGATIVE Final    Comment: (NOTE) The Xpert Xpress SARS-CoV-2/FLU/RSV plus assay is intended as an aid in the diagnosis of influenza from Nasopharyngeal swab specimens and should not be used as a sole basis for treatment. Nasal washings and aspirates are unacceptable for Xpert Xpress  SARS-CoV-2/FLU/RSV testing.  Fact Sheet for Patients: BloggerCourse.com  Fact Sheet for Healthcare Providers: SeriousBroker.it  This test is not yet approved or cleared by the [place] FDA and has been authorized for detection and/or diagnosis of SARS-CoV-2 by FDA under an Emergency Use Authorization (EUA). This EUA will remain in effect (meaning this test can be used) for the duration of the COVID-19 declaration under Section 564(b)(1) of the Act, 21 U.S.C. section 360bbb-3(b)(1), unless the authorization is terminated or revoked.     Resp Syncytial Virus by PCR NEGATIVE NEGATIVE Final    Comment: (NOTE) Fact Sheet for Patients: BloggerCourse.com  Fact Sheet for Healthcare Providers: SeriousBroker.it  This test is not yet approved or cleared by the [place] FDA and has been authorized for detection and/or diagnosis of SARS-CoV-2 by FDA under an Emergency Use Authorization (EUA). This EUA will remain in effect (meaning this test can be used) for the duration of the COVID-19 declaration under Section 564(b)(1) of the Act, 21 U.S.C. section 360bbb-3(b)(1), unless the authorization is terminated or revoked.  Performed at [facility], [address] [place], [place] [address]   Culture, blood (Routine X 2) w Reflex to ID Panel     Status: None (Preliminary result)   Collection Time: 01/16/23  4:55 AM   Specimen: BLOOD LEFT HAND  Result Value Ref Range Status   Specimen Description BLOOD LEFT HAND  Final   Special Requests   Final    BOTTLES DRAWN AEROBIC AND ANAEROBIC Blood Culture adequate volume   Culture   Final    NO GROWTH < 12 HOURS Performed at [facility], [address] [place], [place] [address]    Report Status PENDING  Incomplete  Culture, blood (Routine X 2) w Reflex to ID Panel     Status: None (Preliminary result)    Collection Time: 01/16/23  4:55 AM   Specimen: BLOOD  Result Value Ref Range Status   Specimen Description BLOOD LEFT ANTECUBITAL  Final   Special Requests   Final    BOTTLES DRAWN AEROBIC AND ANAEROBIC Blood Culture adequate volume   Culture   Final    NO GROWTH < 12 HOURS Performed at [facility], [address] [place], [place] [address]    Report Status PENDING  Incomplete     Radiology Studies: DG Chest 2 View  Result Date: 01/15/2023 CLINICAL DATA:  Shortness of breath after COVID vaccination. EXAM: CHEST - 2 VIEW COMPARISON:  Chest radiograph 11/12/2022. Lung bases from concurrent abdominopelvic CT. FINDINGS: Moderate right pleural effusion, increased from prior exam. There is a small left pleural effusion, improved from September exam. Heart is upper normal in size. Interstitial thickening may represent edema or be chronic, and is similar to prior exam. No pneumothorax. IMPRESSION: 1. Moderate right pleural effusion, increased from prior exam. Small left pleural effusion, improved from September exam. 2. Interstitial thickening may represent edema or be chronic. Electronically Signed  By: Narda Rutherford M.D.   On: 01/15/2023 19:51   CT ABDOMEN PELVIS WO CONTRAST  Result Date: 01/15/2023 CLINICAL DATA:  Acute abdominal pain.  Diarrhea. EXAM: CT ABDOMEN AND PELVIS WITHOUT CONTRAST TECHNIQUE: Multidetector CT imaging of the abdomen and pelvis was performed following the standard protocol without IV contrast. RADIATION DOSE REDUCTION: This exam was performed according to the departmental dose-optimization program which includes automated exposure control, adjustment of the mA and/or kV according to patient size and/or use of iterative reconstruction technique. COMPARISON:  None Available. FINDINGS: Lower chest: Moderate right and small left pleural effusion. Associated compressive atelectasis in the right lower, right middle, and left lower lobes. The heart is upper normal in size,  there is left ventricular dilatation. Hepatobiliary: Nodular hepatic contours typical of cirrhosis. No evidence of focal liver abnormality on this unenhanced exam. Small gallstones within the gallbladder. No biliary dilatation. Pancreas: No ductal dilatation or inflammation. Spleen: No splenomegaly. Adrenals/Urinary Tract: No adrenal nodule. Mild bilateral renal parenchymal atrophy. No hydronephrosis. No renal calculi. Urinary bladder is minimally distended, grossly normal for degree of distension. Stomach/Bowel: Detailed bowel assessment is limited due to the presence of ascites and lack of contrast. Equivocal gastric wall thickening. Equivocal duodenal wall thickening. There are duodenal diverticula. No small bowel distension. Normal appendix. Multifocal colonic diverticulosis without evidence of focal diverticulitis. There is colonic wall thickening from the transverse colon through the distal descending colon. Small volume of formed colonic stool. Vascular/Lymphatic: Aortic and branch atherosclerosis. Retroaortic left renal vein. Suspected recannulated umbilical vein, not well assessed in the absence of IV contrast. There is no obvious bulky abdominopelvic adenopathy, although ascites limits detailed assessment. Reproductive: The uterus is not seen, presumed hysterectomy. No evidence of adnexal mass. Other: Moderate to large volume abdominopelvic ascites. Ascitic fluid is simple in density. Generalized edema of the subcutaneous and intra-abdominal fat. No free air. Musculoskeletal: Mild scoliosis and degenerative change in the spine. There are no acute or suspicious osseous abnormalities. IMPRESSION: 1. 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 2. Colonic wall thickening from the transverse colon through the distal descending colon, suspicious for colitis. This may be infectious or inflammatory. Distribution is not typical for portal colopathy. 3. Colonic diverticulosis without  evidence of focal diverticulitis. 4. Cholelithiasis. 5. Moderate right and small left pleural effusions with associated compressive atelectasis. Aortic Atherosclerosis (ICD10-I70.0). Electronically Signed   By: Narda Rutherford M.D.   On: 01/15/2023 19:50     Scheduled Meds:  fentaNYL       furosemide  40 mg Intravenous BID   levothyroxine  88 mcg Oral QAC breakfast   metoprolol succinate  12.5 mg Oral Daily   pantoprazole  40 mg Oral Daily   potassium chloride  40 mEq Oral BID   sodium chloride flush  3 mL Intravenous Q12H   Continuous Infusions:  sodium chloride     [START ON 01/17/2023] albumin human     cefTRIAXone (ROCEPHIN)  IV 2 g (01/16/23 0719)   metronidazole 500 mg (01/16/23 0859)     LOS: 0 days    Time spent:    Zannie Cove, MD [facility]   01/16/2023, 9:42 AM

## 2023-01-16 NOTE — Progress Notes (Signed)
Radiology called me to clarify order of paracentesis consult on the patient at 0719. Paged Jomarie Longs MD concerning order and notified me she does need a it and notified radiology at [phone].

## 2023-01-16 NOTE — Evaluation (Signed)
Physical Therapy Evaluation Patient Details Name: Jessica Torres MRN: [id number] DOB: 1940/07/09 Today's Date: 01/16/2023  History of Present Illness  Jessica Torres is a 82 y.o. female admitted 11/7 with decompensated cirrhosis. Underwent paracentesis 11/8. PMH: chronic systolic heart failure with reduced ejection fraction (30 to 35%), CAD, CKD stage V, hepatic cirrhosis with history of esophageal varices, chronic hypoxic respiratory failure on 2L oxygen at baseline, chronic anemia and chronic thrombocytopenia  Clinical Impression  Pt admitted with above diagnosis. Pt was unable to get OOB today due to fatigue and nausea.  Bed level eval working on UE and LE exercises and positioning in bed.  Pt should progress well and has family support at home.  Pt currently with functional limitations due to the deficits listed below (see PT Problem List). Pt will benefit from acute skilled PT to increase their independence and safety with mobility to allow discharge.           If plan is discharge home, recommend the following: A little help with walking and/or transfers;A little help with bathing/dressing/bathroom;Assistance with cooking/housework;Assist for transportation;Help with stairs or ramp for entrance   Can travel by private vehicle        Equipment Recommendations BSC/3in1  Recommendations for Other Services       Functional Status Assessment Patient has had a recent decline in their functional status and demonstrates the ability to make significant improvements in function in a reasonable and predictable amount of time.     Precautions / Restrictions Precautions Precautions: Fall Precaution Comments: 2LO2 prn Restrictions Weight Bearing Restrictions: No      Mobility  Bed Mobility               General bed mobility comments: Bed level assessment today as pt felt nauseated and fatigued after paracentesis this am.  Attempted to roll but couldnt complete. Did assist pt to [facility]  and positiion body comfortably for pt.    Transfers                   General transfer comment: TBA    Ambulation/Gait                  Stairs            Wheelchair Mobility     Tilt Bed    Modified Rankin (Stroke Patients Only)       Balance                                             Pertinent Vitals/Pain Pain Assessment Pain Assessment: Faces Faces Pain Scale: Hurts even more Pain Location: neck Pain Descriptors / Indicators: Discomfort, Grimacing, Guarding Pain Intervention(s): Limited activity within patient's tolerance, Monitored during session, Repositioned    Home Living Family/patient expects to be discharged to:: Private residence Living Arrangements: Children Available Help at Discharge: Family;Available 24 hours/day (son and daughter in law) Type of Home: House Home Access: Level entry       Home Layout: One level Home Equipment: Agricultural consultant (2 wheels);Shower seat;Wheelchair - manual;Other (comment) (no leg rests on wheelchair, 2LO2 at home)      Prior Function Prior Level of Function : Needs assist             Mobility Comments: for the past month, p would hold to the walls at times; 3 months ago pt  was Independent after a hospital stay; last 5 days pt was using RW ADLs Comments: B/D self     Extremity/Trunk Assessment   Upper Extremity Assessment Upper Extremity Assessment: Defer to OT evaluation    Lower Extremity Assessment Lower Extremity Assessment: Generalized weakness    Cervical / Trunk Assessment Cervical / Trunk Assessment: Kyphotic  Communication   Communication Communication: No apparent difficulties  Cognition Arousal: Lethargic Behavior During Therapy: Flat affect Overall Cognitive Status: Within Functional Limits for tasks assessed                                          General Comments General comments (skin integrity, edema, etc.): VSS     Exercises General Exercises - Upper Extremity Shoulder Flexion: AROM, Both, 10 reps, Supine Shoulder Horizontal ADduction: AROM, Both, 10 reps, Supine Elbow Flexion: AROM, Both, 10 reps, Supine Elbow Extension: AROM, Both, 10 reps, Supine General Exercises - Lower Extremity Ankle Circles/Pumps: AROM, Both, 10 reps, Supine Quad Sets: AROM, Both, 10 reps, Supine Heel Slides: AAROM, Both, 10 reps, Supine   Assessment/Plan    PT Assessment Patient needs continued PT services  PT Problem List Decreased activity tolerance;Decreased balance;Decreased strength;Decreased mobility;Decreased knowledge of use of DME;Decreased safety awareness;Decreased knowledge of precautions;Cardiopulmonary status limiting activity       PT Treatment Interventions DME instruction;Functional mobility training;Therapeutic activities;Balance training;Gait training;Therapeutic exercise;Patient/family education    PT Goals (Current goals can be found in the Care Plan section)  Acute Rehab PT Goals Patient Stated Goal: to go home PT Goal Formulation: With patient Time For Goal Achievement: 01/30/23 Potential to Achieve Goals: Good    Frequency Min 1X/week     Co-evaluation               AM-PAC PT "6 Clicks" Mobility  Outcome Measure Help needed turning from your back to your side while in a flat bed without using bedrails?: A Lot Help needed moving from lying on your back to sitting on the side of a flat bed without using bedrails?: A Lot Help needed moving to and from a bed to a chair (including a wheelchair)?: Total Help needed standing up from a chair using your arms (e.g., wheelchair or bedside chair)?: Total Help needed to walk in hospital room?: Total Help needed climbing 3-5 steps with a railing? : Total 6 Click Score: 8    End of Session Equipment Utilized During Treatment: Oxygen Activity Tolerance: Patient limited by fatigue Patient left: in bed;with call bell/phone within reach;with  bed alarm set Nurse Communication: Mobility status PT Visit Diagnosis: Muscle weakness (generalized) (M62.81)    Time: 1213-1227 PT Time Calculation (min) (ACUTE ONLY): 14 min   Charges:   PT Evaluation $PT Eval Moderate Complexity: 1 Mod   PT General Charges $$ ACUTE PT VISIT: 1 Visit        Jessica Torres,PT Acute Rehab Services [phone]   Jessica Torres 01/16/2023, 2:20 PM

## 2023-01-16 NOTE — Care Management (Signed)
Transition of Care [facility]) - Inpatient Brief Assessment   Patient Details  Name: Jessica Torres MRN: [id number] Date of Birth: May 27, 1940  [facility] CM/SW Contact:    Lockie Pares, RN Phone Number: 01/16/2023, 8:48 AM   Clinical Narrative: 82 yo patient lives in [place] with son. Presented with loose stools, ascites, swelling in extremities, AKI. Currently has home health active from 10/4 through [facility].  Will likely need continued services.  Recommend Palliative consult for GOC.  TOC will follow for needs, recommendations, and transitions of care.   Transition of Care Asessment: Insurance and Status: Insurance coverage has been reviewed Patient has primary care physician: Yes Home environment has been reviewed: [place] at home with son   Prior/Current Home Services: Current home services (active with [facility]) Social Determinants of Health Reivew: SDOH reviewed no interventions necessary Readmission risk has been reviewed: Yes Transition of care needs: transition of care needs identified, TOC will continue to follow

## 2023-01-16 NOTE — Progress Notes (Signed)
Pt's O2 saturations decreased to 88% on 2L/min nasal cannula, increased supplemental O2 to 6L/min. O2 saturations increased to 93% & pt resting comfortably. Will continue to monitor.  Bari Edward, RN

## 2023-01-16 NOTE — ED Notes (Signed)
Called [facility] to advise bed is showing ready again

## 2023-01-16 NOTE — Procedures (Signed)
PROCEDURE SUMMARY:  Successful ultrasound guided paracentesis from the right lower quadrant.  Yielded 3 liters of amber fluid.  No immediate complications.  The patient tolerated the procedure well.   Specimen was sent for labs.  EBL < 2mL  If the patient eventually requires >/=2 paracenteses in a 30 day period, screening evaluation by the [facility] will be assessed.

## 2023-01-16 NOTE — Progress Notes (Signed)
  Echocardiogram 2D Echocardiogram has been performed.  Delcie Roch 01/16/2023, 8:47 AM

## 2023-01-16 NOTE — H&P (Addendum)
History and Physical  .   Jessica Torres:[id number] DOB: [birth year] DOA: 01/15/2023  PCP: Rebekah Chesterfield, NP   Patient coming from: Home   Chief Complaint:  Chief Complaint  Patient presents with   Diarrhea   ED TRIAGE note:  HPI:  Jessica Torres is a 82 y.o. female with medical history significant of history of chronic systolic heart failure with reduced EF 30 to 35%, CAD, DM type II, CKD stage V, hypothyroidism, gout, hepatic cirrhosis with history of esophageal varices, chronic hypoxic respiratory failure 2 L oxygen at baseline, chronic anemia and chronic thrombocytopenia presented to emergency department at [facility] with complaining of an evaluation for few days of shortness of breath, cough, lower abdominal pain, diarrhea.  During my evaluation at the bedside patient reported that she has been noticing progressively worsening shortness of breath and bilateral lower extremity swelling for last few days.  Also she has mucoid sputum production.  Denies any chest pain, chest pressure, fever and chill.  Patient also reported noticing abdominal distention and having generalized abdominal pain 5-6/10 intensity.  Patient denies any nausea, vomiting and constipation.  Reporting having diarrhea 3-4 bowel movements in a day for last few days.   ED Course:  At presentation to ED patient heart rate found elevated 115, respiratory 20, blood pressure 150/62 and O2 sat 94% room air. Normal lactic acid 1.9. CMP showed hyponatremia 131, elevated creatinine 2.52, elevated BUN 31, low albumin 3.2, elevated AST 46, elevated total bilirubin 8.7, decreased GFR 19, anion gap 11. CBC WBC 8.9, hemoglobin 11.3, hematocrit 31, platelet count 151. UA hazy, small bilirubin, trace leukocyte esterase and few bacteria. Lipase within normal range 41. Troponin 78 trended down to 69.  EKG showing sinus tachycardia heart rate 114. Total direct bilirubin 3.9. Respiratory panel negative for COVID,  flu and influenza. Elevated BNP 2619. Chest x-ray showing moderate right pleural effusion which has been increased from prior, small left oral effusion.  Intestinal thickening represents edema or be chronic.  CT abdomen pelvis: IMPRESSION: 1. 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 2. Colonic wall thickening from the transverse colon through the distal descending colon, suspicious for colitis. This may be infectious or inflammatory. Distribution is not typical for portal colopathy. 3. Colonic diverticulosis without evidence of focal diverticulitis. 4. Cholelithiasis. 5. Moderate right and small left pleural effusions with associated compressive atelectasis.  In the ED patient received fentanyl 25 mcg 2 doses.  Hospitalist has been contacted for further evaluation management and patient has been transferred to [facility].  Patient has been transferred to [facility] for further evaluation management of CHF exacerbation, ascites, decompensated cirrhosis, colitis, bilateral pleural effusion, elevated troponin, hyponatremia, AKI and hyperbilirubinemia.  Per chart review patient was admitted 9/4 to 9/7 for agement of acute on chronic CHF exacerbation.  Patient was treated with IV diuretics.  Patient was intolerant to GDMT due to persistent hypotension.  Cardiology recommended palliative care.  During that time goal of care is DNR but patient declined hospice care.   Review of Systems:  Review of Systems  Constitutional:  Positive for malaise/fatigue. Negative for chills, fever and weight loss.  HENT:  Negative for hearing loss.   Eyes:  Negative for blurred vision.  Respiratory:  Positive for cough, sputum production and shortness of breath. Negative for wheezing.   Cardiovascular:  Positive for leg swelling. Negative for chest pain and palpitations.  Gastrointestinal:  Positive for abdominal pain and diarrhea.  Negative for blood in stool,  constipation, heartburn, melena, nausea and vomiting.  Genitourinary:  Negative for dysuria, flank pain, frequency, hematuria and urgency.  Musculoskeletal:  Negative for myalgias and neck pain.  Neurological:  Negative for dizziness, weakness and headaches.  Endo/Heme/Allergies:  Negative for environmental allergies and polydipsia. Does not bruise/bleed easily.  Psychiatric/Behavioral:  The patient is not nervous/anxious.     Past Medical History:  Diagnosis Date   Arthritis    "fingers probably" (10/30/2016)   Bulging lumbar disc    Chronic lower back pain    Difficulty urinating    GERD (gastroesophageal reflux disease)    Heart murmur    hx   HFrEF (heart failure with reduced ejection fraction) (HCC)    a. EF 40 to 45% by echocardiogram in 07/2022 b. EF at 30 to 35% by imaging in 10/2022   Hyperlipidemia    statin intolerant   Hypertension    Hypothyroidism    PAD (peripheral artery disease) (HCC)    Sleep apnea    "dx'd years ago; had severe problems; all of the sudden it just went away" (07/06/2014) *still gone" (10/30/2016)   Stroke [facility]) ~ 2010   denies residual on 10/30/2016   Type 2 diabetes mellitus (HCC)    Urgency of urination    Uterine cancer [facility])     Past Surgical History:  Procedure Laterality Date   ANGIOPLASTY Right 01/22/2015   w/ atherectomy R sfa   BALLOON ANGIOPLASTY, ARTERY Right    SFA/notes 07/06/2014   BIOPSY  04/28/2022   Procedure: BIOPSY;  Surgeon: Tressia Danas, MD;  Location: [facility] ENDOSCOPY;  Service: Gastroenterology;;   BREAST EXCISIONAL BIOPSY Left    BREAST SURGERY     CARPAL TUNNEL RELEASE Right 1980's   COLONOSCOPY WITH PROPOFOL N/A 11/26/2020   Procedure: COLONOSCOPY WITH PROPOFOL;  Surgeon: Lanelle Bal, DO;  Location: [facility] ENDO SUITE;  Service: Endoscopy;  Laterality: N/A;  9:00am, pt cannot come in earlier   DILATION AND CURETTAGE OF UTERUS     ESOPHAGOGASTRODUODENOSCOPY N/A 04/28/2022   Procedure: ESOPHAGOGASTRODUODENOSCOPY  (EGD);  Surgeon: Tressia Danas, MD;  Location: [facility] ENDOSCOPY;  Service: Gastroenterology;  Laterality: N/A;   ESOPHAGOGASTRODUODENOSCOPY (EGD) WITH PROPOFOL N/A 04/30/2022   Procedure: ESOPHAGOGASTRODUODENOSCOPY (EGD) WITH PROPOFOL;  Surgeon: Tressia Danas, MD;  Location: [facility] ENDOSCOPY;  Service: Gastroenterology;  Laterality: N/A;  With [facility]   ESOPHAGOGASTRODUODENOSCOPY (EGD) WITH PROPOFOL N/A 07/15/2022   Procedure: ESOPHAGOGASTRODUODENOSCOPY (EGD) WITH PROPOFOL;  Surgeon: Dolores Frame, MD;  Location: [facility] ENDO SUITE;  Service: Gastroenterology;  Laterality: N/A;   HEMORRHOID SURGERY     "dr lanced it; in dr's office"   HOT HEMOSTASIS N/A 04/30/2022   Procedure: HOT HEMOSTASIS (ARGON PLASMA COAGULATION/BICAP);  Surgeon: Tressia Danas, MD;  Location: [facility] ENDOSCOPY;  Service: Gastroenterology;  Laterality: N/A;   HOT HEMOSTASIS  07/15/2022   Procedure: HOT HEMOSTASIS (ARGON PLASMA COAGULATION/BICAP);  Surgeon: Marguerita Merles, Reuel Boom, MD;  Location: [facility] ENDO SUITE;  Service: Gastroenterology;;   INCISION AND DRAINAGE BREAST ABSCESS Left    LOWER EXTREMITY ANGIOGRAM N/A 07/06/2014   Procedure: LOWER EXTREMITY ANGIOGRAM & R-SFA atherectomy and balloon angioplasty;  Surgeon: Runell Gess, MD;  Location: [facility] CATH LAB;  Service: Cardiovascular;  Laterality: R   LOWER EXTREMITY ANGIOGRAM  10/30/2016   LOWER EXTREMITY ANGIOGRAPHY N/A 10/30/2016   Procedure: Lower Extremity Angiography;  Surgeon: Runell Gess, MD;  Location: [facility] INVASIVE CV LAB;  Service: Cardiovascular;  Laterality: N/A;   PERIPHERAL VASCULAR CATHETERIZATION N/A 08/17/2014  Procedure: Lower Extremity Angiography;  Surgeon: Runell Gess, MD;  Location: [facility] INVASIVE CV LAB;  Service: Cardiovascular;  Laterality: N/A;   PERIPHERAL VASCULAR CATHETERIZATION Left 08/17/2014   Procedure: Peripheral Vascular Atherectomy and Viabahn stent L-SFA;  Surgeon: Runell Gess, MD; Laterality: Left; L-SFA   PERIPHERAL VASCULAR  CATHETERIZATION N/A 01/11/2015   Procedure: Lower Extremity Angiography;  Surgeon: Runell Gess, MD;  Location: [facility] INVASIVE CV LAB;  Service: Cardiovascular;  Laterality: N/A;   PERIPHERAL VASCULAR CATHETERIZATION Right 01/22/2015   Procedure: Peripheral Vascular Atherectomy;  Surgeon: Runell Gess, MD;  Location: [facility] INVASIVE CV LAB;  Service: Cardiovascular;  Laterality: Right;   POLYPECTOMY  11/26/2020   Procedure: POLYPECTOMY;  Surgeon: Lanelle Bal, DO;  Location: [facility] ENDO SUITE;  Service: Endoscopy;;   POLYPECTOMY  07/15/2022   Procedure: POLYPECTOMY INTESTINAL;  Surgeon: Dolores Frame, MD;  Location: [facility] ENDO [facility]ITE;  Service: Gastroenterology;;   TUBAL LIGATION     VAGINAL HYSTERECTOMY       reports that she has never smoked. She has never used smokeless tobacco. She reports that she does not currently use alcohol. She reports that she does not use drugs.  Allergies  Allergen Reactions   Morphine And Codeine Swelling and Other (See Comments)    Facial swelling   Latex Rash   Statins Other (See Comments)    Myalgias     Family History  Problem Relation Age of Onset   Heart attack Mother    Heart disease Mother    Arrhythmia Mother        has PPM   Heart disease Father    Diabetes Sister    Breast cancer Neg Hx     Prior to Admission medications   Medication Sig Start Date End Date Taking? Authorizing Provider  meclizine (ANTIVERT) 25 MG tablet Take 12.5-25 mg by mouth daily as needed. 12/23/22  Yes [provider]  Accu-Chek Softclix Lancets lancets Test BS TID Dx E11.9 12/02/21   Junie Spencer, FNP  aspirin 81 MG chewable tablet Chew 1 tablet (81 mg total) by mouth daily. 07/18/22   Tyrone Nine, MD  BD PEN NEEDLE NANO 2ND GEN 32G X 4 MM MISC Use twice daily with insulin Dx E11.9 11/25/21   Jannifer Rodney A, FNP  clopidogrel (PLAVIX) 75 MG tablet Take 75 mg by mouth daily.    [provider]  dextromethorphan 15 MG/5ML syrup Take  10 mLs by mouth 4 (four) times daily as needed for cough.    [provider]  ezetimibe (ZETIA) 10 MG tablet Take 1 tablet (10 mg total) by mouth daily. Patient taking differently: Take 10 mg by mouth at bedtime. 08/22/21   Junie Spencer, FNP  fenofibrate 160 MG tablet TAKE 1 TABLET DAILY 04/02/22   Jannifer Rodney A, FNP  glucose blood (FREESTYLE LITE) test strip Test BS TID Dx E11.9 11/25/21   Jannifer Rodney A, FNP  insulin glargine, 2 Unit Dial, (TOUJEO MAX SOLOSTAR) 300 UNIT/ML Solostar Pen Inject 15 Units into the skin at bedtime. 11/15/22 01/14/23  Glade Lloyd, MD  levothyroxine (SYNTHROID) 88 MCG tablet TAKE 1 TABLET DAILY BEFORE BREAKFAST Patient taking differently: Take 88 mcg by mouth daily before breakfast. 04/24/22   Jannifer Rodney A, FNP  metoprolol succinate (TOPROL XL) 25 MG 24 hr tablet Take 0.5 tablets (12.5 mg total) by mouth daily. 12/03/22   Strader, Lennart Pall, PA-C  ondansetron (ZOFRAN) 4 MG tablet Take 1 tablet (4 mg total)  by mouth every 8 (eight) hours as needed for nausea or vomiting. 11/07/22   Zannie Cove, MD  pantoprazole (PROTONIX) 40 MG tablet Take 1 tablet (40 mg total) by mouth daily. Follow up with gastroenterology for additional recommendations 11/15/22   Glade Lloyd, MD  potassium chloride SA (KLOR-CON M) 20 MEQ tablet Take 1 tablet (20 mEq total) by mouth daily. 11/15/22   Glade Lloyd, MD  torsemide (DEMADEX) 20 MG tablet Take 1 tablet (20 mg total) by mouth daily. 11/16/22   Glade Lloyd, MD  metFORMIN (GLUCOPHAGE) 1000 MG tablet Take 1 tablet (1,000 mg total) by mouth 2 (two) times daily with a meal. Patient taking differently: Take 1,000 mg by mouth 2 (two) times daily with a meal. Only taking once a day 04/06/19 07/13/19  Remus Loffler, PA-C     Physical Exam: Vitals:   01/16/23 0000 01/16/23 0100 01/16/23 0145 01/16/23 0235  BP:   121/63 (!) 117/58  Pulse: (!) 108 (!) 110 (!) 117 (!) 109  Resp: 20 16 20 20   Temp:   98.1 F (36.7 C) 97.8 F  (36.6 C)  TempSrc:    Oral  SpO2: 94% 96% 96% 99%  Weight:      Height:        Physical Exam Constitutional:      Appearance: She is ill-appearing.  HENT:     Mouth/Throat:     Mouth: Mucous membranes are moist.  Eyes:     Pupils: Pupils are equal, round, and reactive to light.  Cardiovascular:     Rate and Rhythm: Normal rate and regular rhythm.     Pulses: Normal pulses.     Heart sounds: Normal heart sounds.  Pulmonary:     Effort: Pulmonary effort is normal.     Breath sounds: Normal breath sounds.  Abdominal:     General: Bowel sounds are normal. There is distension.     Tenderness: There is abdominal tenderness. There is no guarding or rebound.  Musculoskeletal:        General: Swelling present.     Cervical back: Neck supple.     Right lower leg: Edema present.     Left lower leg: Edema present.  Skin:    General: Skin is dry.     Capillary Refill: Capillary refill takes less than 2 seconds.     Coloration: Skin is jaundiced. Skin is not pale.     Findings: No bruising or erythema.  Neurological:     Mental Status: She is alert and oriented to person, place, and time.  Psychiatric:        Mood and Affect: Mood normal.      Labs on Admission: I have personally reviewed following labs and imaging studies  CBC: Recent Labs  Lab 01/15/23 1552  WBC 8.9  NEUTROABS 6.4  HGB 11.3*  HCT 31.3*  MCV 97.5  PLT 151   Basic Metabolic Panel: Recent Labs  Lab 01/15/23 1552  NA 131*  K 4.2  CL 94*  CO2 26  GLUCOSE 171*  BUN 31*  CREATININE 2.52*  CALCIUM 10.2   GFR: Estimated Creatinine Clearance: 16 mL/min (A) (by C-G formula based on SCr of 2.52 mg/dL (H)). Liver Function Tests: Recent Labs  Lab 01/15/23 1552  AST 46*  ALT 13  ALKPHOS 46  BILITOT 8.7*  PROT 7.1  ALBUMIN 3.2*   Recent Labs  Lab 01/15/23 1552  LIPASE 47   No results for input(s): "AMMONIA" in the last 168  hours. Coagulation Profile: No results for input(s): "INR",  "PROTIME" in the last 168 hours. Cardiac Enzymes: Recent Labs  Lab 01/15/23 1658 01/15/23 1917  TROPONINIHS 78* 69*   BNP (last 3 results) Recent Labs    10/29/22 1222 11/12/22 1549 01/15/23 1658  BNP 3,983.8* 4,449.1* 2,619.5*   HbA1C: No results for input(s): "HGBA1C" in the last 72 hours. CBG: Recent Labs  Lab 01/16/23 0234  GLUCAP 124*   Lipid Profile: No results for input(s): "CHOL", "HDL", "LDLCALC", "TRIG", "CHOLHDL", "LDLDIRECT" in the last 72 hours. Thyroid Function Tests: No results for input(s): "TSH", "T4TOTAL", "FREET4", "T3FREE", "THYROIDAB" in the last 72 hours. Anemia Panel: No results for input(s): "VITAMINB12", "FOLATE", "FERRITIN", "TIBC", "IRON", "RETICCTPCT" in the last 72 hours. Urine analysis:    Component Value Date/Time   COLORURINE YELLOW 01/15/2023 1552   APPEARANCEUR HAZY (A) 01/15/2023 1552   APPEARANCEUR Clear 10/28/2019 0900   LABSPEC 1.011 01/15/2023 1552   PHURINE 5.0 01/15/2023 1552   GLUCOSEU NEGATIVE 01/15/2023 1552   HGBUR NEGATIVE 01/15/2023 1552   BILIRUBINUR SMALL (A) 01/15/2023 1552   BILIRUBINUR Negative 10/28/2019 0900   KETONESUR NEGATIVE 01/15/2023 1552   PROTEINUR NEGATIVE 01/15/2023 1552   NITRITE NEGATIVE 01/15/2023 1552   LEUKOCYTESUR TRACE (A) 01/15/2023 1552    Radiological Exams on Admission: I have personally reviewed images DG Chest 2 View  Result Date: 01/15/2023 CLINICAL DATA:  Shortness of breath after COVID vaccination. EXAM: CHEST - 2 VIEW COMPARISON:  Chest radiograph 11/12/2022. Lung bases from concurrent abdominopelvic CT. FINDINGS: Moderate right pleural effusion, increased from prior exam. There is a small left pleural effusion, improved from September exam. Heart is upper normal in size. Interstitial thickening may represent edema or be chronic, and is similar to prior exam. No pneumothorax. IMPRESSION: 1. Moderate right pleural effusion, increased from prior exam. Small left pleural effusion, improved  from September exam. 2. Interstitial thickening may represent edema or be chronic. Electronically Signed   By: Narda Rutherford M.D.   On: 01/15/2023 19:51   CT ABDOMEN PELVIS WO CONTRAST  Result Date: 01/15/2023 CLINICAL DATA:  Acute abdominal pain.  Diarrhea. EXAM: CT ABDOMEN AND PELVIS WITHOUT CONTRAST TECHNIQUE: Multidetector CT imaging of the abdomen and pelvis was performed following the standard protocol without IV contrast. RADIATION DOSE REDUCTION: This exam was performed according to the departmental dose-optimization program which includes automated exposure control, adjustment of the mA and/or kV according to patient size and/or use of iterative reconstruction technique. COMPARISON:  None Available. FINDINGS: Lower chest: Moderate right and small left pleural effusion. Associated compressive atelectasis in the right lower, right middle, and left lower lobes. The heart is upper normal in size, there is left ventricular dilatation. Hepatobiliary: Nodular hepatic contours typical of cirrhosis. No evidence of focal liver abnormality on this unenhanced exam. Small gallstones within the gallbladder. No biliary dilatation. Pancreas: No ductal dilatation or inflammation. Spleen: No splenomegaly. Adrenals/Urinary Tract: No adrenal nodule. Mild bilateral renal parenchymal atrophy. No hydronephrosis. No renal calculi. Urinary bladder is minimally distended, grossly normal for degree of distension. Stomach/Bowel: Detailed bowel assessment is limited due to the presence of ascites and lack of contrast. Equivocal gastric wall thickening. Equivocal duodenal wall thickening. There are duodenal diverticula. No small bowel distension. Normal appendix. Multifocal colonic diverticulosis without evidence of focal diverticulitis. There is colonic wall thickening from the transverse colon through the distal descending colon. Small volume of formed colonic stool. Vascular/Lymphatic: Aortic and branch atherosclerosis.  Retroaortic left renal vein. Suspected recannulated umbilical vein, not well  assessed in the absence of IV contrast. There is no obvious bulky abdominopelvic adenopathy, although ascites limits detailed assessment. Reproductive: The uterus is not seen, presumed hysterectomy. No evidence of adnexal mass. Other: Moderate to large volume abdominopelvic ascites. Ascitic fluid is simple in density. Generalized edema of the subcutaneous and intra-abdominal fat. No free air. Musculoskeletal: Mild scoliosis and degenerative change in the spine. There are no acute or suspicious osseous abnormalities. IMPRESSION: 1. 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 2. Colonic wall thickening from the transverse colon through the distal descending colon, suspicious for colitis. This may be infectious or inflammatory. Distribution is not typical for portal colopathy. 3. Colonic diverticulosis without evidence of focal diverticulitis. 4. Cholelithiasis. 5. Moderate right and small left pleural effusions with associated compressive atelectasis. Aortic Atherosclerosis (ICD10-I70.0). Electronically Signed   By: Narda Rutherford M.D.   On: 01/15/2023 19:50    EKG: My personal interpretation of EKG shows: EKG showing sinus tachycardia heart rate 114, right atrial enlargement, right axis deviation.    Assessment/Plan: Principal Problem:   Acute exacerbation of CHF (congestive heart failure) (HCC) Active Problems:   CKD (chronic kidney disease), stage IV (HCC)   Hepatic cirrhosis (HCC)   AKI (acute kidney injury) (HCC)   Bilateral pleural effusion   Ascites   Colitis   Hyperbilirubinemia   Transaminitis   Insulin dependent type 2 diabetes mellitus (HCC)   Hypothyroidism   Normocytic anemia   Hyponatremia   Chronic hypoxic respiratory failure (HCC)   Gout   Elevated troponin   Ischemic cardiomyopathy   Chronic idiopathic thrombocytopenia (HCC)    Assessment and Plan: Acute exacerbation  of CHF Congestive heart failure reduced EF 30 to 35% > Patient presented with complaining of shortness of breath, worsening bilateral lower extremity swelling and mucoid cough for last few days.  -Per chart review patient has been admitted in September 2024 and following outpatient cardiology.  History of low EF 30 to 35% and GDMT intolerance due to low blood pressure.  Currently on Toprol-XL 12.5 mg daily and torsemide 20 mg daily at home. -At station to ED patient's blood pressure 150s/64, tachycardic 115, O2 sat 94% on room air. -EKG showing sinus tachycardia heart rate 114. -Elevated BNP 2619.   -Chest x-ray showing bilateral pleural effusion right greater than the left side and interstitial thickening representing edema.   -Physical exam showing bilateral lower extremities pitting edema 2+. -Starting treatment for CHF exacerbation and admitting patient to progressive unit.   -Continue low-dose IV diuretics Lasix 20 mg IV twice daily.  Continue to monitor blood pressure with IV Lasix, assess volume status on daily basis and urine output and daily weight. -Obtaining echocardiogram. - Continue cardiac monitoring. -Please consult cardiology in the a.m. for further recommendation.  History of ischemic cardiomyopathy -Per chart review nuclear medicine stress test from 10/24/2022 nonconclusive due to abnormal EKG - Echo from 11/01/2022 showed EF 30 to 35%, left ventricular wall motion abnormality and severe hypokinesis of the left ventricle, left apical and septal wall and apical segment. -Based on the previous echocardiogram concern for ischemic cardiomyopathy however previously heart cath has been deferred due to advanced CKD stage IV - Currently on medical management include aspirin, Plavix, ezetimibe and Toprol-XL at home. - Holding aspirin and Plavix for anticipation of paracentesis later today.  MI type 2 -Troponin 68>70>78.  EKG shows sinus tachycardia heart rate 114.  There is no ST  anterior abnormality.  Denies any chest pain.  ACS ruled out. Elevated  bated troponin secondary to demand ischemia in the context of CHF exacerbation.  Currently managing for CHF exacerbation. -Continue to trend troponin and continue cardiac monitoring.  Prerenal acute kidney injury CKD stage IV -Creatinine 2.52.  Baseline creatinine in between 1.7-1.9.  GFR between 23-30. -Prerenal acute kidney injury in the setting of CHF exacerbation.  Concern for development of cardiorenal syndrome. -Currently treating for CHF exacerbation with IV diuretics.  Continue to monitor improvement of creatinine, renal function, urine output. - Avoid nephrotoxic agent. -If all function continues to deteriorate even with IV diuretics in that case consider consult nephrology for further evaluation recommendation.  Hyponatremia -Sodium 131 and low chloride 94.  Hyponatremia in the setting of volume overload in the setting of CHF exacerbation and ascites. - Continue treat with IV diuretics.  Monitor serum sodium level closely.  Decompensated hepatic cirrhosis Ascites Concern for SBP History of esophageal varices Transaminitis Hyperbilirubinemia > Patient has history of decompensated hepatic cirrhosis.  She had a history of GI bleed and esophageal varices in May 2024. - Currently presenting with abdominal pain, ascites and generalized abdominal swelling. - CMP showing AST 46 and bilirubin 8.7.  Low albumin 3.8.  Direct bilirubin 3.9.  Checking pro time INR.  Unable to [employer] and MELd pending INR. -Physical exam showing generalized abdominal tenderness.  Concern for development of SBP, ascites and decompensated hepatic cirrhosis. >Per chart review patient does not have any outpatient established GI care.  When patient was admitted in May 2024 while inpatient evaluated by [facility] gastroenterology group.  However on discharge patient did not follow-up and establish care with GI on 1. - CT abdomen pelvis showing  hepatic cirrhosis with moderate to large volume abdominal pelvic ascites. -Unclear etiology of development of hepatic cirrhosis. - Checking hepatitis panel. -With concern for SBP starting treating with IV ceftriaxone 2 g daily. - Obtaining blood culture, sputum culture and urine culture. -Continue ceftriaxone 2 g daily - Giving albumin 50 g once daily for 2 days. -Continue to monitor hepatic panel. -Sent secure chat message to [facility] Dr. Chales Abrahams for evaluation patient in the morning. -Consulted IR for paracentesis in the a.m. will send paracentesis fluid cell count, Gram stain and culture. -Holding aspirin.  Keeping patient NPO.  Cholelithiasis - CT abdomen pelvis showing evidence of cholelithiasis.  Physical exam negative Murphy sign.  There is no concern for cholecystitis at this time.  Bilateral pleural effusion R>L Chronic hypoxic respiratory failure on 2 L oxygen baseline -Chest x-ray showing moderate right-sided pleural effusion and small left-sided pleural effusion.  Per chart review when patient was admitted in September 2024 developed bilateral pleural effusion and underwent right-sided thoracentesis 1.6 L fluid has been removed.   -Currently treating patient with IV diuretics for CHF exacerbation and plan for paracentesis later today. -Need to repeat chest x-ray once euvolemic and if still showing persistent pleural effusion in that case will require thoracentesis. - Continue  pulse ox and continue supplemental oxygen to keep O2 sat above 92%.  Colitis -CT abdomen pelvis showing Colonic wall thickening from the transverse colon through the distal descending colon, suspicious for colitis. -Patient is complaining about diarrhea for last few days. - Checking C. difficile and GI panel.  Avoid loperamide rules out C. difficile infection. -Continue ceftriaxone 2 g IV daily and IV metronidazole 500 mg twice daily.  Anemia of chronic disease - Stable H&H 11.3 and 31.  MCV 97. -  Continue to monitor.  Chronic thrombocytopenia - Platelet count 151.  Baseline around 1  40-1 43. - Continue to monitor  Insulin-dependent DM type II -A1c 5.4 in 11/2022. -At home patient is on glargine 15 units at bedtime.  Given patient is currently n.p.o. holding any insulin regimen at this time also A1c 5.4.  With advanced age there is no indication for strict A1c management.  Hypothyroidism -Continue levothyroxine 88 mcg daily  Chronic hypoxic respiratory failure -Patient has chronic hypoxic respiratory failure 2 L oxygen at baseline at home.  Currently O2 sat 99% on 2 L.  Hyperlipidemia - Holding ezetimibe in the setting of transaminitis.  Due to multiple advanced comorbidities I believe palliative consult would be helpful to decide further goal of care.  I am reaching out to palliative care consult.  DVT prophylaxis:  SCDs.  Deferring pharmacological prophylaxis as planning for paracentesis later today and patient also has thrombocytopenia. Code Status:  DNR/DNI(Do NOT Intubate).  Reviewed ACP document. Diet: Keeping patient n.p.o. now for paracentesis later today. Family Communication: There is no family member at bedside now. Disposition Plan: Pending clinical improvement and disposition. Consults: Gastroenterology and palliative care. Admission status:   Inpatient, Step Down Unit  Severity of Illness: The appropriate patient status for this patient is INPATIENT. Inpatient status is judged to be reasonable and necessary in order to provide the required intensity of service to ensure the patient's safety. The patient's presenting symptoms, physical exam findings, and initial radiographic and laboratory data in the context of their chronic comorbidities is felt to place them at high risk for further clinical deterioration. Furthermore, it is not anticipated that the patient will be medically stable for discharge from the hospital within 2 midnights of admission.   * I certify  that at the point of admission it is my clinical judgment that the patient will require inpatient hospital care spanning beyond 2 midnights from the point of admission due to high intensity of service, high risk for further deterioration and high frequency of surveillance required.Marland Kitchen    Tereasa Coop, MD [facility]  How to contact the [facility] Attending or Consulting provider 7A - 7P or covering provider during after hours 7P -7A, for this patient.  Check the care team in [facility] and look for a) attending/consulting [facility] provider listed and b) the [facility] team listed Log into [email or web address] and use 's universal password to access. If you do not have the password, please contact the hospital operator. Locate the [facility] provider you are looking for under [facility] and page to a number that you can be directly reached. If you still have difficulty reaching the provider, please page the [facility] (Director on Call) for the Hospitalists listed on amion for assistance.  01/16/2023, 5:16 AM

## 2023-01-16 NOTE — Consult Note (Addendum)
Consultation  Referring Provider:  [facility]  Primary Care Physician:  Rebekah Chesterfield, NP Primary Gastroenterologist:  Dr. Marletta Lor Reason for Consultation:   Decompensated Cirrhosis     LOS: 0 days          HPI:   Jessica Torres is a 82 y.o. 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  Patient initially presented with CHF exacerbation characterized by shortness of breath and bilateral lower extremity edema.  Patient has had multiple EGDs this year for iron deficiency anemia and upper GI bleeds characterized by melena.  See detailed EGD as below.  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  Upon admission Chest x-ray with moderate right pleural effusion BNP 2619 CT abdomen pelvis shows hepatic cirrhosis with moderate to large volume ascites, colitis in the transverse colon through the distal descending colon suspicious for infectious versus inflammatory, moderate right and left pleural effusions Hgb 9.7 Platelets 135 T bili 7.7 Albumin 2.5 BUN 29, Cr. 2.52  Patient states she was told by someone (she is unsure who) that she had cirrhosis a few weeks ago.  Denies previous history of fatty liver.  Denies alcohol use.  Denies NSAID use.  States she has noticed her belly getting bigger over the last 3 weeks associated with peripheral edema.  She also notes intermittent diarrhea over the last month, denies melena/hematochezia.    PREVIOUS GI WORKUP   EGD 07/11/2022 by Dr. Levon Hedger for melena - Grade I esophageal varices. - Portal hypertensive gastropathy. - A single gastric polyp. Resected and retrieved.- Gastric antral vascular ectasia without bleeding.  Treated with argon plasma coagulation ( APC) . - Normal examined duodenum.  EGD 04/30/2022 after Plavix washout for treatment of GAVE - Normal esophagus. - Multiple gastric polyps. Recent biopsies confirmed fundic gland polyps. - Gastric antral vascular ectasia without bleeding. Treated with argon plasma coagulation ( APC) . - Mucosal changes in the duodenum. - No specimens collected.  EGD 04/27/2022 for upper GI bleed - Normal esophagus. - Erythematous mucosa in the antrum. Suspected gastritis versus mild GAVE. Biopsied. - A few gastric polyps. Biopsied. - Normal examined duodenum. Biopsied. - The examination was otherwise normal.-No treatment due to being on Plavix  colonoscopy in September 2022 by Dr. Marletta Lor for iron deficiency anemia. Colonoscopy was notable for 4 small polyps in the transverse colon, left-sided diverticulosis and internal hemorrhoids.  Past Medical History:  Diagnosis Date   Arthritis    "fingers probably" (10/30/2016)   Bulging lumbar disc    Chronic lower back pain    Difficulty urinating    GERD (gastroesophageal reflux disease)    Heart murmur    hx   HFrEF (heart failure with reduced ejection fraction) (HCC)    a. EF 40 to 45% by echocardiogram in 07/2022 b. EF at 30 to 35% by imaging in 10/2022   Hyperlipidemia    statin intolerant   Hypertension    Hypothyroidism    PAD (peripheral artery disease) (HCC)    Sleep apnea    "dx'd years ago; had severe problems; all of the sudden it just went away" (07/06/2014) *still gone" (10/30/2016)   Stroke [facility]) ~ 2010   denies residual on 10/30/2016  Type 2 diabetes mellitus (HCC)    Urgency of urination    Uterine cancer [facility])     Surgical History:  She  has a past surgical history that includes Balloon angioplasty, artery (Right); Carpal tunnel release (Right, 1980's); Hemorrhoid surgery; Incision and drainage breast abscess (Left); Vaginal hysterectomy; lower extremity angiogram (N/A, 07/06/2014); Cardiac  catheterization (N/A, 08/17/2014); Cardiac catheterization (Left, 08/17/2014); Cardiac catheterization (N/A, 01/11/2015); Tubal ligation; Angioplasty (Right, 01/22/2015); Cardiac catheterization (Right, 01/22/2015); Lower extremity angiogram (10/30/2016); Breast surgery; Dilation and curettage of uterus; Lower Extremity Angiography (N/A, 10/30/2016); Breast excisional biopsy (Left); Colonoscopy with propofol (N/A, 11/26/2020); polypectomy (11/26/2020); Esophagogastroduodenoscopy (N/A, 04/28/2022); biopsy (04/28/2022); Esophagogastroduodenoscopy (egd) with propofol (N/A, 04/30/2022); Hot hemostasis (N/A, 04/30/2022); Esophagogastroduodenoscopy (egd) with propofol (N/A, 07/15/2022); Hot hemostasis (07/15/2022); and Polypectomy (07/15/2022). Family History:  Her family history includes Arrhythmia in her mother; Diabetes in her sister; Heart attack in her mother; Heart disease in her father and mother. Social History:   reports that she has never smoked. She has never used smokeless tobacco. She reports that she does not currently use alcohol. She reports that she does not use drugs.  Prior to Admission medications   Medication Sig Start Date End Date Taking? Authorizing Provider  meclizine (ANTIVERT) 25 MG tablet Take 12.5-25 mg by mouth daily as needed. 12/23/22  Yes [provider]  Accu-Chek Softclix Lancets lancets Test BS TID Dx E11.9 12/02/21   Junie Spencer, FNP  aspirin 81 MG chewable tablet Chew 1 tablet (81 mg total) by mouth daily. 07/18/22   Tyrone Nine, MD  BD PEN NEEDLE NANO 2ND GEN 32G X 4 MM MISC Use twice daily with insulin Dx E11.9 11/25/21   Jannifer Rodney A, FNP  clopidogrel (PLAVIX) 75 MG tablet Take 75 mg by mouth daily.    [provider]  dextromethorphan 15 MG/5ML syrup Take 10 mLs by mouth 4 (four) times daily as needed for cough.    [provider]  ezetimibe (ZETIA) 10 MG tablet Take 1 tablet (10 mg total) by mouth daily. Patient taking differently: Take 10 mg by  mouth at bedtime. 08/22/21   Junie Spencer, FNP  fenofibrate 160 MG tablet TAKE 1 TABLET DAILY 04/02/22   Jannifer Rodney A, FNP  glucose blood (FREESTYLE LITE) test strip Test BS TID Dx E11.9 11/25/21   Jannifer Rodney A, FNP  insulin glargine, 2 Unit Dial, (TOUJEO MAX SOLOSTAR) 300 UNIT/ML Solostar Pen Inject 15 Units into the skin at bedtime. 11/15/22 01/14/23  Glade Lloyd, MD  levothyroxine (SYNTHROID) 88 MCG tablet TAKE 1 TABLET DAILY BEFORE BREAKFAST Patient taking differently: Take 88 mcg by mouth daily before breakfast. 04/24/22   Jannifer Rodney A, FNP  metoprolol succinate (TOPROL XL) 25 MG 24 hr tablet Take 0.5 tablets (12.5 mg total) by mouth daily. 12/03/22   Strader, Lennart Pall, PA-C  ondansetron (ZOFRAN) 4 MG tablet Take 1 tablet (4 mg total) by mouth every 8 (eight) hours as needed for nausea or vomiting. 11/07/22   Zannie Cove, MD  pantoprazole (PROTONIX) 40 MG tablet Take 1 tablet (40 mg total) by mouth daily. Follow up with gastroenterology for additional recommendations 11/15/22   Glade Lloyd, MD  potassium chloride SA (KLOR-CON M) 20 MEQ tablet Take 1 tablet (20 mEq total) by mouth daily. 11/15/22   Glade Lloyd, MD  torsemide (DEMADEX) 20 MG tablet Take 1 tablet (20 mg total) by mouth daily. 11/16/22   Glade Lloyd, MD  metFORMIN (GLUCOPHAGE) 1000 MG tablet Take 1 tablet (1,000  mg total) by mouth 2 (two) times daily with a meal. Patient taking differently: Take 1,000 mg by mouth 2 (two) times daily with a meal. Only taking once a day 04/06/19 07/13/19  Remus Loffler, PA-C    Current Facility-Administered Medications  Medication Dose Route Frequency Provider Last Rate Last Admin   0.9 %  sodium chloride infusion  250 mL Intravenous PRN Janalyn Shy, Subrina, MD       acetaminophen (TYLENOL) tablet 650 mg  650 mg Oral Q6H PRN Janalyn Shy, Subrina, MD       Or   acetaminophen (TYLENOL) suppository 650 mg  650 mg Rectal Q6H PRN Janalyn Shy, Subrina, MD       [START ON 01/17/2023] albumin human 25  % solution 50 g  50 g Intravenous Once Sundil, Subrina, MD       cefTRIAXone (ROCEPHIN) 2 g in sodium chloride 0.9 % 100 mL IVPB  2 g Intravenous Q24H Sundil, Subrina, MD 200 mL/hr at 01/16/23 0719 2 g at 01/16/23 0719   fentaNYL (SUBLIMAZE) 50 MCG/ML injection            furosemide (LASIX) injection 40 mg  40 mg Intravenous BID Zannie Cove, MD       levothyroxine (SYNTHROID) tablet 88 mcg  88 mcg Oral QAC breakfast Janalyn Shy, Subrina, MD   88 mcg at 01/16/23 0541   metoprolol succinate (TOPROL-XL) 24 hr tablet 12.5 mg  12.5 mg Oral Daily Sundil, Subrina, MD       metroNIDAZOLE (FLAGYL) IVPB 500 mg  500 mg Intravenous Q12H Sundil, Subrina, MD       ondansetron [facility]) tablet 4 mg  4 mg Oral Q6H PRN Janalyn Shy, Subrina, MD       Or   ondansetron [facility]) injection 4 mg  4 mg Intravenous Q6H PRN Janalyn Shy, Subrina, MD       pantoprazole (PROTONIX) EC tablet 40 mg  40 mg Oral Daily Sundil, Subrina, MD       senna-docusate (Senokot-S) tablet 1 tablet  1 tablet Oral QHS PRN Janalyn Shy, Subrina, MD       sodium chloride flush (NS) 0.9 % injection 3 mL  3 mL Intravenous Q12H Sundil, Subrina, MD       sodium chloride flush (NS) 0.9 % injection 3 mL  3 mL Intravenous PRN Janalyn Shy, Subrina, MD        Allergies as of 01/15/2023 - Review Complete 01/15/2023  Allergen Reaction Noted   Morphine and codeine Swelling and Other (See Comments) 06/14/2014   Latex Rash 07/06/2014   Statins Other (See Comments) 08/02/2014    Review of Systems  Constitutional:  Positive for malaise/fatigue. Negative for chills, fever and weight loss.  HENT:  Negative for hearing loss and tinnitus.   Eyes:  Negative for blurred vision and double vision.  Respiratory:  Positive for cough, sputum production, shortness of breath and wheezing.   Cardiovascular:  Positive for orthopnea and leg swelling. Negative for chest pain and palpitations.  Gastrointestinal:  Positive for abdominal pain, diarrhea and nausea. Negative for blood in stool,  constipation, heartburn, melena and vomiting.  Genitourinary:  Negative for dysuria and urgency.  Musculoskeletal:  Negative for myalgias and neck pain.  Skin:  Negative for itching and rash.  Neurological:  Negative for seizures and loss of consciousness.  Psychiatric/Behavioral:  Negative for depression and suicidal ideas.        Physical Exam:  Vital signs in last 24 hours: Temp:  [97.8 F (36.6 C)-98.2 F (36.8 C)] 98 F (  36.7 C) (11/08 0725) Pulse Rate:  [104-117] 107 (11/08 0725) Resp:  [12-20] 17 (11/08 0725) BP: (108-129)/(51-63) 108/56 (11/08 0725) SpO2:  [92 %-99 %] 98 % (11/08 0725) Weight:  [56.9 kg] 56.9 kg (11/07 1546) Last BM Date : 01/15/23 Last BM recorded by nurses in past 5 days No data recorded  Physical Exam Constitutional:      Appearance: She is ill-appearing.  HENT:     Head: Normocephalic and atraumatic.     Nose: Nose normal. No congestion.     Mouth/Throat:     Mouth: Mucous membranes are moist.     Pharynx: Oropharynx is clear.  Eyes:     General: Scleral icterus present.     Extraocular Movements: Extraocular movements intact.  Cardiovascular:     Rate and Rhythm: Normal rate and regular rhythm.  Pulmonary:     Effort: Pulmonary effort is normal.     Comments: Increased effort with breathing Abdominal:     General: There is distension.     Palpations: Abdomen is soft. There is no mass.     Tenderness: There is abdominal tenderness.     Hernia: No hernia is present.  Musculoskeletal:        General: Swelling present. Normal range of motion.     Cervical back: Normal range of motion and neck supple.     Right lower leg: Edema present.     Left lower leg: Edema present.  Skin:    General: Skin is warm and dry.     Coloration: Skin is jaundiced.  Neurological:     General: No focal deficit present.     Mental Status: She is oriented to person, place, and time.  Psychiatric:        Behavior: Behavior normal.        Thought Content:  Thought content normal.        Judgment: Judgment normal.      LAB RESULTS: Recent Labs    01/15/23 1552 01/16/23 0455  WBC 8.9 8.2  HGB 11.3* 9.7*  HCT 31.3* 27.0*  PLT 151 135*   BMET Recent Labs    01/15/23 1552 01/16/23 0455  NA 131* 130*  K 4.2 3.4*  CL 94* 94*  CO2 26 24  GLUCOSE 171* 108*  BUN 31* 29*  CREATININE 2.52* 2.52*  CALCIUM 10.2 9.3   LFT Recent Labs    01/15/23 1917 01/16/23 0455  PROT  --  6.2*  ALBUMIN  --  2.5*  AST  --  43*  ALT  --  16  ALKPHOS  --  40  BILITOT  --  7.7*  BILIDIR 3.9*  --    PT/INR Recent Labs    01/16/23 0455  LABPROT 19.7*  INR 1.6*    STUDIES: DG Chest 2 View  Result Date: 01/15/2023 CLINICAL DATA:  Shortness of breath after COVID vaccination. EXAM: CHEST - 2 VIEW COMPARISON:  Chest radiograph 11/12/2022. Lung bases from concurrent abdominopelvic CT. FINDINGS: Moderate right pleural effusion, increased from prior exam. There is a small left pleural effusion, improved from September exam. Heart is upper normal in size. Interstitial thickening may represent edema or be chronic, and is similar to prior exam. No pneumothorax. IMPRESSION: 1. Moderate right pleural effusion, increased from prior exam. Small left pleural effusion, improved from September exam. 2. Interstitial thickening may represent edema or be chronic. Electronically Signed   By: Narda Rutherford M.D.   On: 01/15/2023 19:51   CT ABDOMEN PELVIS WO  CONTRAST  Result Date: 01/15/2023 CLINICAL DATA:  Acute abdominal pain.  Diarrhea. EXAM: CT ABDOMEN AND PELVIS WITHOUT CONTRAST TECHNIQUE: Multidetector CT imaging of the abdomen and pelvis was performed following the standard protocol without IV contrast. RADIATION DOSE REDUCTION: This exam was performed according to the departmental dose-optimization program which includes automated exposure control, adjustment of the mA and/or kV according to patient size and/or use of iterative reconstruction technique.  COMPARISON:  None Available. FINDINGS: Lower chest: Moderate right and small left pleural effusion. Associated compressive atelectasis in the right lower, right middle, and left lower lobes. The heart is upper normal in size, there is left ventricular dilatation. Hepatobiliary: Nodular hepatic contours typical of cirrhosis. No evidence of focal liver abnormality on this unenhanced exam. Small gallstones within the gallbladder. No biliary dilatation. Pancreas: No ductal dilatation or inflammation. Spleen: No splenomegaly. Adrenals/Urinary Tract: No adrenal nodule. Mild bilateral renal parenchymal atrophy. No hydronephrosis. No renal calculi. Urinary bladder is minimally distended, grossly normal for degree of distension. Stomach/Bowel: Detailed bowel assessment is limited due to the presence of ascites and lack of contrast. Equivocal gastric wall thickening. Equivocal duodenal wall thickening. There are duodenal diverticula. No small bowel distension. Normal appendix. Multifocal colonic diverticulosis without evidence of focal diverticulitis. There is colonic wall thickening from the transverse colon through the distal descending colon. Small volume of formed colonic stool. Vascular/Lymphatic: Aortic and branch atherosclerosis. Retroaortic left renal vein. Suspected recannulated umbilical vein, not well assessed in the absence of IV contrast. There is no obvious bulky abdominopelvic adenopathy, although ascites limits detailed assessment. Reproductive: The uterus is not seen, presumed hysterectomy. No evidence of adnexal mass. Other: Moderate to large volume abdominopelvic ascites. Ascitic fluid is simple in density. Generalized edema of the subcutaneous and intra-abdominal fat. No free air. Musculoskeletal: Mild scoliosis and degenerative change in the spine. There are no acute or suspicious osseous abnormalities. IMPRESSION: 1. 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 2. Colonic wall  thickening from the transverse colon through the distal descending colon, suspicious for colitis. This may be infectious or inflammatory. Distribution is not typical for portal colopathy. 3. Colonic diverticulosis without evidence of focal diverticulitis. 4. Cholelithiasis. 5. Moderate right and small left pleural effusions with associated compressive atelectasis. Aortic Atherosclerosis (ICD10-I70.0). Electronically Signed   By: Narda Rutherford M.D.   On: 01/15/2023 19:50      Impression    Decompensated cirrhosis of unknown etiology Chest x-ray with moderate right pleural effusion BNP 2619 CT abdomen pelvis shows hepatic cirrhosis with moderate to large volume ascites, moderate right and left pleural effusions Hgb 9.7 Platelets 135 T bili 7.7 Albumin 2.5 BUN 29, Cr. 2.52 Negative hepatitis panel MELD 3.0: 34 01/16/2023 4:55 AM - Serologic evaluation for acute and chronic liver disease: ANA, AMA, IgG, asma, ceruloplasmin, alpha-1, iron, ferritin - Continue to trend LFTs - Serial INR, daily - monitor daily MELD - paracentesis  send fluid for gram stain & culture, cell count, differential, total protein, albumin, cytology - fluid restrict to 2L if hyponatremia is present -Avoid large volume paracentesis with worsening kidney function -1 g/kg per day of Albumin (max 100 g) for 2 days.   Colitis CT abdomen pelvis showing colitis in transverse colon through the distal descending colon infectious/inflammatory - C. difficile and GI pathogen panel - Wall thickening seen on CT could be bowel edema in the setting of her cirrhosis, though with intermittent diarrhea cannot rule out infection. -- on ceftriaxone and flagyl  Chronic anemia, history of GAVE  and esophageal varices Hgb 9.7, stable.  Appears baseline 8-9 No overt bleeding.  Suspect drop in hemoglobin likely hemodilution all.  Will monitor.  Acute exacerbation of CHF -Cardiology following - BNP 2619 - Being diuresed with  Lasix  AKI on CKD  Thank you for your kind consultation, we will continue to follow.   Bayley Leanna Sato  01/16/2023, 8:38 AM    Attending physician's note  I have taken a history, reviewed the chart and examined the patient. I performed a substantive portion of this encounter, including complete performance of at least one of the key components, in conjunction with the APP. I agree with the APP's note, impression and recommendations.    82 year old female with severe congestive heart failure, stage IV CKD, chronic hypoxic respiratory failure on home O2 hospitalized with shortness of breath and CHF exacerbation Recent diagnosis of cirrhosis EGD with grade 1 esophageal varices in May 2024 Volume overload with right pleural effusion likely hepatic hydrothorax and ascites  Fluid management per primary team and cardiology  No active GI bleeding, monitor hemoglobin and transfuse if below 7  LFT abnormalities secondary to congestive hepatopathy and cirrhosis MELD 34  No evidence of hepatic encephalopathy  Chronic intermittent diarrhea: No bowel movement since this morning With C. difficile and GI pathogen panel Noted bowel wall thickening on CT, likely secondary to underdistention versus bowel wall edema in the setting of severe hypoalbuminemia Consider discontinuing antibiotics  GI will continue to follow along  The patient was provided an opportunity to ask questions and all were answered. The patient agreed with the plan and demonstrated an understanding of the instructions.  Iona Beard , MD [phone]

## 2023-01-17 DIAGNOSIS — K7460 Unspecified cirrhosis of liver: Secondary | ICD-10-CM | POA: Diagnosis not present

## 2023-01-17 DIAGNOSIS — I255 Ischemic cardiomyopathy: Secondary | ICD-10-CM

## 2023-01-17 DIAGNOSIS — I509 Heart failure, unspecified: Secondary | ICD-10-CM

## 2023-01-17 DIAGNOSIS — Z7189 Other specified counseling: Secondary | ICD-10-CM | POA: Diagnosis not present

## 2023-01-17 DIAGNOSIS — R188 Other ascites: Secondary | ICD-10-CM | POA: Diagnosis not present

## 2023-01-17 DIAGNOSIS — J90 Pleural effusion, not elsewhere classified: Secondary | ICD-10-CM | POA: Diagnosis not present

## 2023-01-17 DIAGNOSIS — Z515 Encounter for palliative care: Secondary | ICD-10-CM | POA: Diagnosis not present

## 2023-01-17 DIAGNOSIS — I5023 Acute on chronic systolic (congestive) heart failure: Secondary | ICD-10-CM | POA: Diagnosis not present

## 2023-01-17 DIAGNOSIS — K7290 Hepatic failure, unspecified without coma: Secondary | ICD-10-CM

## 2023-01-17 LAB — GASTROINTESTINAL PANEL BY PCR, STOOL (REPLACES STOOL CULTURE)

## 2023-01-17 LAB — CBC
HCT: 25.8 % — ABNORMAL LOW (ref 36.0–46.0)
Hemoglobin: 9.1 g/dL — ABNORMAL LOW (ref 12.0–15.0)
MCH: 34.6 pg — ABNORMAL HIGH (ref 26.0–34.0)
MCHC: 35.3 g/dL (ref 30.0–36.0)
MCV: 98.1 fL (ref 80.0–100.0)
Platelets: 135 10*3/uL — ABNORMAL LOW (ref 150–400)
RBC: 2.63 MIL/uL — ABNORMAL LOW (ref 3.87–5.11)
RDW: 19.2 % — ABNORMAL HIGH (ref 11.5–15.5)
WBC: 9.7 10*3/uL (ref 4.0–10.5)
nRBC: 0 % (ref 0.0–0.2)

## 2023-01-17 LAB — GLUCOSE, CAPILLARY
Glucose-Capillary: 126 mg/dL — ABNORMAL HIGH (ref 70–99)
Glucose-Capillary: 199 mg/dL — ABNORMAL HIGH (ref 70–99)

## 2023-01-17 LAB — COMPREHENSIVE METABOLIC PANEL
ALT: 17 U/L (ref 0–44)
AST: 62 U/L — ABNORMAL HIGH (ref 15–41)
Albumin: 3 g/dL — ABNORMAL LOW (ref 3.5–5.0)
Alkaline Phosphatase: 36 U/L — ABNORMAL LOW (ref 38–126)
Anion gap: 13 (ref 5–15)
BUN: 32 mg/dL — ABNORMAL HIGH (ref 8–23)
CO2: 23 mmol/L (ref 22–32)
Calcium: 9.8 mg/dL (ref 8.9–10.3)
Chloride: 97 mmol/L — ABNORMAL LOW (ref 98–111)
Creatinine, Ser: 2.55 mg/dL — ABNORMAL HIGH (ref 0.44–1.00)
GFR, Estimated: 19 mL/min — ABNORMAL LOW (ref >60)
Glucose, Bld: 137 mg/dL — ABNORMAL HIGH (ref 70–99)
Potassium: 4.6 mmol/L (ref 3.5–5.1)
Sodium: 133 mmol/L — ABNORMAL LOW (ref 135–145)
Total Bilirubin: 7.9 mg/dL — ABNORMAL HIGH (ref <1.2)
Total Protein: 6.3 g/dL — ABNORMAL LOW (ref 6.5–8.1)

## 2023-01-17 LAB — ANA W/REFLEX IF POSITIVE: Anti Nuclear Antibody (ANA): NEGATIVE

## 2023-01-17 LAB — C DIFFICILE QUICK SCREEN W PCR REFLEX
C Diff antigen: NEGATIVE
C Diff interpretation: NOT DETECTED
C Diff toxin: NEGATIVE

## 2023-01-17 MED ORDER — LACTULOSE 10 GM/15ML PO SOLN
20.0000 g | Freq: Two times a day (BID) | ORAL | Status: DC
  Administered 2023-01-17 – 2023-01-19 (×5): 20 g via ORAL
  Filled 2023-01-17 (×5): qty 30

## 2023-01-17 MED ORDER — FUROSEMIDE 10 MG/ML IJ SOLN
80.0000 mg | Freq: Two times a day (BID) | INTRAMUSCULAR | Status: DC
  Administered 2023-01-17 – 2023-01-18 (×3): 80 mg via INTRAVENOUS
  Filled 2023-01-17 (×3): qty 8

## 2023-01-17 NOTE — Consult Note (Signed)
Consultation Note Date: 01/17/2023   Patient Name: Jessica Torres  DOB: [birth year]  MRN: [id number]  Age / Sex: 82 y.o., female  PCP: Rebekah Chesterfield, NP Referring Physician: Zannie Cove, MD  Reason for Consultation: Establishing goals of care  HPI/Patient Profile: 82 y.o. female  with past medical history of chronic systolic heart failure with reduced EF 30 to 35%, CAD, DM type II, CKD stage V, hypothyroidism, gout, hepatic cirrhosis with history of esophageal varices, chronic hypoxic respiratory failure 2 L oxygen at baseline, chronic anemia and chronic thrombocytopenia admitted on 01/15/2023 with shortness of breath, cough abdominal pain, diarrhea, BL LE swelling and distention.   Patient has been admitted 3 times in the past 6 months for recurrent exacerbations of her chronic diseases.  She is now admitted for acute CHF exacerbation, MI type II, AKI on CKD 4, decompensated cirrhosis, transaminitis, hyperbilirubinemia, initial concern for SBP/colitis, and bilateral pleural effusion.  Patient is status post paracentesis 11/8 with 3 L of fluid removed. PMT has been consulted to assist with goals of care conversation.  Clinical Assessment and Goals of Care:  I have reviewed medical records including EPIC notes, labs and imaging, discussed with RN, assessed the patient and then met at the bedside with patient's son, daughter-in-law, and sister to discuss diagnosis prognosis, GOC, EOL wishes, disposition and options.  I introduced Palliative Medicine as specialized medical care for people living with serious illness. It focuses on providing relief from the symptoms and stress of a serious illness. The goal is to improve quality of life for both the patient and the family.  We discussed a brief life review of the patient and then focused on their current illness.  The natural disease trajectory and expectations at  EOL were discussed.  I attempted to elicit values and goals of care important to the patient.    Medical History Review and Understanding:  Reviewed patient's acute illness in the context of her chronic comorbidities alongside her sister.  Patient's son and daughter-in-law stepped out prior to this portion of her conversation, preferred to get an update from sister later after they were able to rest.  Social History: Patient has been living with her son and daughter-in-law since her last discharge from the hospital.  She is also well supported by her sister.  She has home health services and lives in [place].  Functional and Nutritional State: Patient was able to ambulate without a walker, bathe herself, dress herself up until about 2 and half weeks ago.  She is now requiring a walker again within the past week.  Albumin of 3.0 noted.  Family reports her appetite is very poor, they struggle to encourage her to eat and feel this is related due to the swelling and pressure on her abdomen.  They also feel this issue with her volume status is related to a burrito she ate from the [employer] with high sodium.  Advance Directives: A detailed discussion regarding advanced directives was had.  No documentation currently on file, though per prior PMT notes son Thayer Ohm is HCPOA.  MOST form on file remains accurate -DNR/DNI, limited medical interventions, no feeding tube.  Code Status: Concepts specific to code status, artifical feeding and hydration, and rehospitalization were considered and discussed.   Discussion: Patient's family shared that they were not aware she could have received both home health and palliative services in the outpatient setting.  They had 1 visit from outpatient palliative care and then preferred to continue with  home health in light of this misunderstanding.  They would be open to palliative care services moving forward, acknowledging it would be helpful to have an extra  advocate and layer of support, as anything can happen anytime.    Patient's son shares that goal at this time is to hopefully get her back to her prior level of functioning and continue with home health services, adding on outpatient palliative as well.  Reviewed the high likelihood that she may need SNF placement prior to return home even with the best possible outcome.  Family would be open to SNF placement if patient is unable to use a walker to get herself to the bathroom.  They are available 24/7 to support her, however she does need to at least do some portion of her mobility.  Son shares that his primary concern is patient's appetite, expressing his understanding that she cannot survive if she does not eat enough.  He also expresses understanding that she cannot improve if she cannot to get the energy to walk or recover.  They would be open to involvement of hospice if patient does not improve, however at this time they are hopeful that she does.  Offered to review medical update with family and son/daughter-in-law expressed a preference to return home to rest.  Patient's sister is agreeable to continue discussing this and will provide son with an update later on.  We had a lengthy conversation about patient's poor overall prognosis and the difficulty in balancing her several different decompensated illnesses.  Sister Malachi Bonds shares that they were told patient has until February.  We reviewed the disease trajectory and expected course of recurrent heart failure, liver decompensation, and kidney failure.  Counseled that prognosis is likely even shorter with each hospitalization for these complications.  Malachi Bonds feels the patient has had a very difficult life and understands it may be God's will that she is nearing her time to return home to Him.  She would hope that patient could get any food or drinks she wants for comfort if patient does not improve in the coming days.  I agree this would be the most  compassionate and appropriate thing.  Provided reassurance that the team will be continuing these discussions pending clinical course and provide further guidance.   Hospice and Palliative Care services outpatient were explained and offered.   Discussed the importance of continued conversation with family and the medical providers regarding overall plan of care and treatment options, ensuring decisions are within the context of the patient's values and GOCs.   Questions and concerns were addressed.  Hard Choices booklet left for review. The family was encouraged to call with questions or concerns.  PMT will continue to support holistically.   SUMMARY OF RECOMMENDATIONS   -Continue DNR/DNI -Continue current care plan -Patient's family are cautiously optimistic and hopeful for improvement at this time.  Goal would be be return home with home health and outpatient palliative care if she recovers, though they would also consider SNF -Family is open to hospice if patient continues to decline -Psychosocial and emotional for provided -Ongoing goals of care conversations -PMT will continue to follow and support   Prognosis:  Very poor  Discharge Planning: To Be Determined      Primary Diagnoses: Present on Admission:  AKI (acute kidney injury) (HCC)  CKD (chronic kidney disease), stage IV (HCC)  Hepatic cirrhosis (HCC)  Bilateral pleural effusion  Hypothyroidism  Hyponatremia  Normocytic anemia   Physical Exam Vitals and nursing  note reviewed.  Constitutional:      General: She is not in acute distress.    Appearance: She is ill-appearing.  Cardiovascular:     Rate and Rhythm: Tachycardia present.  Pulmonary:     Effort: Pulmonary effort is normal.  Skin:    General: Skin is warm.  Neurological:     Mental Status: She is lethargic.    Vital Signs: BP (!) 99/51   Pulse (!) 114   Temp 98 F (36.7 C) (Oral)   Resp 20   Ht 5\' 5"  (1.651 m)   Wt 58.4 kg   SpO2 94%   BMI  21.42 kg/m  Pain Scale: 0-10   Pain Score: 0-No pain   SpO2: SpO2: 94 % O2 Device:SpO2: 94 % O2 Flow Rate: .O2 Flow Rate (L/min): 6 L/min   Palliative Assessment/Data: 20%     Total time: I spent 75 minutes in the care of the patient today in the above activities and documenting the encounter.  MDM: High    Delora Gravatt Jeni Salles, PA-C  Palliative Medicine Team Team phone # [phone]  Thank you for allowing the Palliative Medicine Team to assist in the care of this patient. Please utilize secure chat with additional questions, if there is no response within 30 minutes please call the above phone number.  Palliative Medicine Team providers are available by phone from 7am to 7pm daily and can be reached through the team cell phone.  Should this patient require assistance outside of these hours, please call the patient's attending physician.

## 2023-01-17 NOTE — Progress Notes (Addendum)
Progress Note   LOS: 1 day   Chief Complaint: Decompensated cirrhosis   Subjective   Patient states she is less short of breath than she was yesterday.  She is currently tolerating diet without difficulty.  She feels better after having a paracentesis and having fluid taken off of her belly.  She has not had a bowel movement   Objective   Vital signs in last 24 hours: Temp:  [98 F (36.7 C)-98.6 F (37 C)] 98 F (36.7 C) (11/09 0734) Pulse Rate:  [105-115] 114 (11/09 0924) Resp:  [16-20] 20 (11/09 0734) BP: (99-107)/(44-56) 99/51 (11/09 0924) SpO2:  [92 %-98 %] 94 % (11/09 0734) Weight:  [58.4 kg] 58.4 kg (11/09 0351) Last BM Date : 01/15/23 Last BM recorded by nurses in past 5 days Stool Type: Type 4 (Like a smooth, soft sausage or snake) (01/17/2023  3:53 AM)  General:   female in no acute distress  Heart:  Regular rate and rhythm; no murmurs Pulm: Clear anteriorly; no wheezing, somewhat labored breathing, on oxygen Abdomen: soft, nondistended, normal bowel sounds in all quadrants. Nontender without guarding. No organomegaly appreciated.  Much improved compared to yesterday Neurologic:  Alert and  oriented x4;  No focal deficits.  Psych:  Cooperative. Normal mood and affect.  Intake/Output from previous day: 11/08 0701 - 11/09 0700 In: 739.9 [P.O.:240; IV Piggyback:499.9] Out: 360 [Urine:360] Intake/Output this shift: No intake/output data recorded.  Studies/Results: IR Paracentesis  Result Date: 01/16/2023 INDICATION: 82 year old female. History of cirrhosis with portal hypertension, esophageal varices, CHF. Patient presents for therapeutic and diagnostic paracentesis EXAM: ULTRASOUND GUIDED THERAPEUTIC AND DIAGNOSTIC PARACENTESIS MEDICATIONS: Lidocaine 1% 10 mL COMPLICATIONS: None immediate. PROCEDURE: Informed written consent was obtained from the patient after a discussion of the risks, benefits and alternatives to treatment. A timeout was performed prior to the  initiation of the procedure. Initial ultrasound scanning demonstrates a small amount of ascites within the right lower abdominal quadrant. The right lower abdomen was prepped and draped in the usual sterile fashion. 1% lidocaine was used for local anesthesia. Following this, a 19 gauge, 7-cm, Yueh catheter was introduced. An ultrasound image was saved for documentation purposes. The paracentesis was performed. The catheter was removed and a dressing was applied. The patient tolerated the procedure well without immediate post procedural complication. FINDINGS: A total of approximately 3 L of amber color fluid was removed. Samples were sent to the laboratory as requested by the clinical team. IMPRESSION: Successful ultrasound-guided therapeutic and diagnostic paracentesis yielding 3 liters of peritoneal fluid. Performed by Jessica Grant NP PLAN: If the patient eventually requires >/=2 paracenteses in a 30 day period, candidacy for formal evaluation by the [facility] will be assessed. Electronically Signed   By: Gilmer Mor D.O.   On: 01/16/2023 14:06   ECHOCARDIOGRAM LIMITED  Result Date: 01/16/2023    ECHOCARDIOGRAM LIMITED REPORT   Patient Name:   Jessica Torres Date of Exam: 01/16/2023 Medical Rec #:  [id number]      Height:       65.0 in Accession #:    [id number]     Weight:       125.4 lb Date of Birth:  01-Aug-1940      BSA:          1.622 m Patient Age:    82 years       BP:           114/54 mmHg Patient Gender: F  HR:           112 bpm. Exam Location:  Inpatient Procedure: Limited Echo, Color Doppler and Cardiac Doppler Indications:    acute systolic chf  History:        Patient has prior history of Echocardiogram examinations, most                 recent 11/01/2022. Cardiomyopathy and CHF, CAD, chronic kidney                 disease and PAD, Signs/Symptoms:Murmur; Risk                 Factors:Hypertension, Dyslipidemia and Diabetes.   Sonographer:    Delcie Roch RDCS Referring Phys: [id number] SUBRINA SUNDIL IMPRESSIONS  1. LV function is severely depressed There is thickening of the basal lateral,base/mid anterior, basal inferior and base/mid septal walls. Akinetic elsewhere .Marland Kitchen Left ventricular ejection fraction, by estimation, is 20 to 25%. The left ventricle has severely decreased function. The left ventricle demonstrates global hypokinesis.  2. Right ventricular systolic function is normal. The right ventricular size is normal. There is moderately elevated pulmonary artery systolic pressure.  3. Moderate to severe mitral valve regurgitation.  4. The aortic valve is tricuspid. Aortic valve regurgitation is trivial. Aortic valve sclerosis/calcification is present, without any evidence of aortic stenosis.  5. The inferior vena cava is dilated in size with <50% respiratory variability, suggesting right atrial pressure of 15 mmHg. FINDINGS  Left Ventricle: LV function is severely depressed There is thickening of the basal lateral,base/mid anterior, basal inferior and base/mid septal walls. Akinetic elsewhere. Left ventricular ejection fraction, by estimation, is 20 to 25%. The left ventricle has severely decreased function. The left ventricle demonstrates global hypokinesis. Right Ventricle: The right ventricular size is normal. Right vetricular wall thickness was not assessed. Right ventricular systolic function is normal. There is moderately elevated pulmonary artery systolic pressure. The tricuspid regurgitant velocity is  3.33 m/s, and with an assumed right atrial pressure of 8 mmHg, the estimated right ventricular systolic pressure is 52.4 mmHg. Left Atrium: Left atrial size was normal in size. Right Atrium: Right atrial size was normal in size. Pericardium: There is no evidence of pericardial effusion. Mitral Valve: Moderate to severe mitral valve regurgitation. Tricuspid Valve: The tricuspid valve is normal in structure. Tricuspid valve  regurgitation is trivial. Aortic Valve: The aortic valve is tricuspid. Aortic valve regurgitation is trivial. Aortic valve sclerosis/calcification is present, without any evidence of aortic stenosis. Aorta: The aortic root and ascending aorta are structurally normal, with no evidence of dilitation. Venous: The inferior vena cava is dilated in size with less than 50% respiratory variability, suggesting right atrial pressure of 15 mmHg. Additional Comments: Spectral Doppler performed. Color Doppler performed.  LEFT VENTRICLE PLAX 2D LVIDd:         5.00 cm LVIDs:         4.70 cm LV PW:         0.90 cm LV IVS:        0.60 cm LVOT diam:     1.40 cm LV SV:         32 LV SV Index:   20 LVOT Area:     1.54 cm  LV Volumes (MOD) LV vol d, MOD A4C: 120.0 ml LV vol s, MOD A4C: 80.8 ml LV SV MOD A4C:     120.0 ml IVC IVC diam: 1.90 cm LEFT ATRIUM         Index LA diam:  3.20 cm 1.97 cm/m  AORTIC VALVE LVOT Vmax:   116.00 cm/s LVOT Vmean:  81.700 cm/s LVOT VTI:    0.208 m  AORTA Ao Asc diam: 2.90 cm MR Peak grad:    69.2 mmHg    TRICUSPID VALVE MR Mean grad:    39.0 mmHg    TR Peak grad:   44.4 mmHg MR Vmax:         416.00 cm/s  TR Vmax:        333.00 cm/s MR Vmean:        281.0 cm/s MR PISA:         1.57 cm     SHUNTS MR PISA Eff ROA: 15 mm       Systemic VTI:  0.21 m MR PISA Radius:  0.50 cm      Systemic Diam: 1.40 cm Dietrich Pates MD Electronically signed by Dietrich Pates MD Signature Date/Time: 01/16/2023/1:43:45 PM    Final    DG Chest 2 View  Result Date: 01/15/2023 CLINICAL DATA:  Shortness of breath after COVID vaccination. EXAM: CHEST - 2 VIEW COMPARISON:  Chest radiograph 11/12/2022. Lung bases from concurrent abdominopelvic CT. FINDINGS: Moderate right pleural effusion, increased from prior exam. There is a small left pleural effusion, improved from September exam. Heart is upper normal in size. Interstitial thickening may represent edema or be chronic, and is similar to prior exam. No pneumothorax. IMPRESSION: 1.  Moderate right pleural effusion, increased from prior exam. Small left pleural effusion, improved from September exam. 2. Interstitial thickening may represent edema or be chronic. Electronically Signed   By: Narda Rutherford M.D.   On: 01/15/2023 19:51   CT ABDOMEN PELVIS WO CONTRAST  Result Date: 01/15/2023 CLINICAL DATA:  Acute abdominal pain.  Diarrhea. EXAM: CT ABDOMEN AND PELVIS WITHOUT CONTRAST TECHNIQUE: Multidetector CT imaging of the abdomen and pelvis was performed following the standard protocol without IV contrast. RADIATION DOSE REDUCTION: This exam was performed according to the departmental dose-optimization program which includes automated exposure control, adjustment of the mA and/or kV according to patient size and/or use of iterative reconstruction technique. COMPARISON:  None Available. FINDINGS: Lower chest: Moderate right and small left pleural effusion. Associated compressive atelectasis in the right lower, right middle, and left lower lobes. The heart is upper normal in size, there is left ventricular dilatation. Hepatobiliary: Nodular hepatic contours typical of cirrhosis. No evidence of focal liver abnormality on this unenhanced exam. Small gallstones within the gallbladder. No biliary dilatation. Pancreas: No ductal dilatation or inflammation. Spleen: No splenomegaly. Adrenals/Urinary Tract: No adrenal nodule. Mild bilateral renal parenchymal atrophy. No hydronephrosis. No renal calculi. Urinary bladder is minimally distended, grossly normal for degree of distension. Stomach/Bowel: Detailed bowel assessment is limited due to the presence of ascites and lack of contrast. Equivocal gastric wall thickening. Equivocal duodenal wall thickening. There are duodenal diverticula. No small bowel distension. Normal appendix. Multifocal colonic diverticulosis without evidence of focal diverticulitis. There is colonic wall thickening from the transverse colon through the distal descending colon.  Small volume of formed colonic stool. Vascular/Lymphatic: Aortic and branch atherosclerosis. Retroaortic left renal vein. Suspected recannulated umbilical vein, not well assessed in the absence of IV contrast. There is no obvious bulky abdominopelvic adenopathy, although ascites limits detailed assessment. Reproductive: The uterus is not seen, presumed hysterectomy. No evidence of adnexal mass. Other: Moderate to large volume abdominopelvic ascites. Ascitic fluid is simple in density. Generalized edema of the subcutaneous and intra-abdominal fat. No free air. Musculoskeletal: Mild scoliosis and degenerative  change in the spine. There are no acute or suspicious osseous abnormalities. IMPRESSION: 1. 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 2. Colonic wall thickening from the transverse colon through the distal descending colon, suspicious for colitis. This may be infectious or inflammatory. Distribution is not typical for portal colopathy. 3. Colonic diverticulosis without evidence of focal diverticulitis. 4. Cholelithiasis. 5. Moderate right and small left pleural effusions with associated compressive atelectasis. Aortic Atherosclerosis (ICD10-I70.0). Electronically Signed   By: Narda Rutherford M.D.   On: 01/15/2023 19:50    Lab Results: Recent Labs    01/15/23 1552 01/16/23 0455 01/17/23 0325  WBC 8.9 8.2 9.7  HGB 11.3* 9.7* 9.1*  HCT 31.3* 27.0* 25.8*  PLT 151 135* 135*   BMET Recent Labs    01/15/23 1552 01/16/23 0455 01/17/23 0325  NA 131* 130* 133*  K 4.2 3.4* 4.6  CL 94* 94* 97*  CO2 26 24 23   GLUCOSE 171* 108* 137*  BUN 31* 29* 32*  CREATININE 2.52* 2.52* 2.55*  CALCIUM 10.2 9.3 9.8   LFT Recent Labs    01/15/23 1917 01/16/23 0455 01/17/23 0325  PROT  --    < > 6.3*  ALBUMIN  --    < > 3.0*  AST  --    < > 62*  ALT  --    < > 17  ALKPHOS  --    < > 36*  BILITOT  --    < > 7.9*  BILIDIR 3.9*  --   --    < > = values in this interval not  displayed.   PT/INR Recent Labs    01/16/23 0455  LABPROT 19.7*  INR 1.6*     Scheduled Meds:  feeding supplement  237 mL Oral BID BM   furosemide  40 mg Intravenous BID   lactulose  20 g Oral BID   levothyroxine  88 mcg Oral QAC breakfast   metoprolol succinate  12.5 mg Oral Daily   pantoprazole  40 mg Oral Daily   Continuous Infusions:  albumin human     cefTRIAXone (ROCEPHIN)  IV 2 g (01/17/23 0557)   metronidazole 500 mg (01/17/23 4098)      Patient profile:   Jessica Torres is a 82 y.o. 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    Impression/Plan:   Decompensated cirrhosis of unknown etiology Chest x-ray with moderate right pleural effusion BNP 2619 CT abdomen pelvis shows hepatic cirrhosis with moderate to large volume ascites, moderate right and left pleural effusions MELD 3.0: 33 01/17/2023 3:25 AM Paracentesis 11/8 with 3 L of fluid, cytology and culture still pending - Serologic workup for cirrhosis still pending - Cytology and culture from paracentesis still pending -- 1 g/kg per day of Albumin (max 100 g) for 2 days.    Colitis CT abdomen pelvis showing colitis in transverse colon through the distal descending colon infectious/inflammatory Bowel wall thickening on CT likely bowel wall edema in the setting of severe hypoalbuminemia -She remains on antibiotics, consider discontinuing -- Not having a bowel movement since admission is reassuring  Chronic anemia, history of GAVE and esophageal varices Hgb 9.1, stable.  Appears baseline 8-9 No overt bleeding.  Suspect drop in hemoglobin likely hemodilution all.  Will monitor.   Acute exacerbation of  CHF -Cardiology following - BNP 2619 - Being diuresed with Lasix   AKI on CKD  Bayley M McMichael  01/17/2023, 9:53 AM   Attending physician's note   I have taken a history, reviewed the chart and examined the patient. I performed a substantive portion of this encounter, including complete performance of at least one of the key components, in conjunction with the APP. I agree with the APP's note, impression and recommendations.    Volume overload with orthopnea secondary to acute CHF exacerbation Volume management per cardiology Decompensated cirrhosis, MELD score 33 Diagnostic paracentesis negative for SBP, agree with discontinuing antibiotics Hemoglobin stable Agree with palliative care consult to discuss goals of care, overall prognosis is poor  GI will sign off, available if have any questions    K. Scherry Ran , MD [phone]

## 2023-01-17 NOTE — Progress Notes (Signed)
Notified MD of patient complaint of pain 9/10. The current pain medication did not reflect her pain scale and the 10mg  oxycodone has expired. If the ordered did not need to change the current pain medication scale to fit current parameters.  Will continue to monitor patient

## 2023-01-17 NOTE — Plan of Care (Signed)
Patient is not having accurate output but she is also not have good intake as well. Attempted to give her an ensure she has refused twice today. She did get up to the chair but will continue to monitor patient

## 2023-01-17 NOTE — Progress Notes (Addendum)
PROGRESS NOTE    Jessica Torres  VHQ:[id number] DOB: 1940/06/29 DOA: 01/15/2023 PCP: Rebekah Chesterfield, NP  Chronically ill 82/F with history of systolic CHF, EF 41-32%, severe MR, CKD 4, liver cirrhosis, portal hypertension, esophageal varices, presumed CAD, chronic respiratory failure on 2 L O2, type 2 diabetes mellitus, history of chronic blood loss anemia and GAVE's disease, history of CVA, hypothyroidism with multiple hospitalizations for heart failure presented to the ED 11/7 with progressive shortness of breath, abdominal swelling, generalized weakness and some diarrhea. -In the ED she was tachycardic, blood pressure 150/62, lactate 1.9, labs with sodium 131, creatinine 2.5, albumin 3.2, hemoglobin 11.3, WBC 8.9, troponin 78, 69, bili 3.9, BNP 2619, chest x-ray with moderate right pleural effusion and interstitial thickening?  Edema CT abdomen pelvis with cirrhosis, moderate to large volume ascites, colon wall thickening?  Colitis, moderate right and small left effusions -11/8, paracentesis 3 L of amber fluid drained  Subjective: -Abdomen feels better after paracentesis, still feels weak tired and short of breath  Assessment and Plan:  Acute on chronic systolic CHF Severe mitral regurgitation, suspected CAD -Worsened in the background of decompensated cirrhosis and CKD 4 -Seventh hospitalization this year with volume overload, prognosis remains poor -Urine output not recorded, continue IV Lasix 80 Mg twice daily, metoprolol  -GDMT limited by CKD 4 -Seen by palliative care few months ago, she declined hospice services then, remains DNR -Palliative care was reconsulted on admission no family at bedside, will d/w son today  Decompensated liver cirrhosis, ascites History of esophageal varices Mild hepatic encephalopathy -Ultrasound paracentesis completed yesterday, 3 L drained, fluid not suggestive of SBP -Discontinue antibiotics, diuretics as noted above -Ammonia level is elevated,  started on lactulose  AKI on CKD 4 Hyponatremia -Baseline creatinine is around 2 -Now 2.5, likely cardiorenal -Remains significantly volume overloaded, diuretics as above, avoid hypotension  Colon wall thickening involving transverse and distal colon -Suspect this is related to ascites, low albumin -Ascitic fluid not suggestive of SBP will discontinue antibiotics and monitor  Chronic anemia, thrombocytopenia Relatively stable monitor  Type 2 diabetes mellitus - HbA1c was 5.4 in September, on glargine at baseline, SSI for now  Chronic hypoxic respiratory failure on 2 L home O2 at baseline, stable  Hypothyroidism  -continue Synthroid  DVT prophylaxis: SCDs Code Status: DNR  Family Communication: No family at bedside, will update son later today Disposition Plan: To be determined  Consultants:    Procedures:   Antimicrobials:    Objective: Vitals:   01/17/23 0005 01/17/23 0351 01/17/23 0734 01/17/23 0924  BP: (!) 107/44 (!) 102/45 (!) 99/51 (!) 99/51  Pulse: (!) 105 (!) 106 (!) 110 (!) 114  Resp: 16 20 20    Temp: 98.6 F (37 C) 98.1 F (36.7 C) 98 F (36.7 C)   TempSrc: Oral Oral Oral   SpO2: 98% 94% 94%   Weight:  58.4 kg    Height:        Intake/Output Summary (Last 24 hours) at 01/17/2023 1055 Last data filed at 01/17/2023 0546 Gross per 24 hour  Intake 739.88 ml  Output 360 ml  Net 379.88 ml   Filed Weights   01/15/23 1546 01/17/23 0351  Weight: 56.9 kg 58.4 kg    Examination:  General exam: Chronically ill debilitated cachectic female laying in bed, AAOx3 HEENT: Positive JVD CVS: S1-S2, regular rhythm Lungs: Decreased breath sounds to bases Abdomen: Soft, not nontender, distended, bowel sounds present Extremities: Positive edema  Psychiatry: Flat affect    Data  Reviewed:   CBC: Recent Labs  Lab 01/15/23 1552 01/16/23 0455 01/17/23 0325  WBC 8.9 8.2 9.7  NEUTROABS 6.4  --   --   HGB 11.3* 9.7* 9.1*  HCT 31.3* 27.0* 25.8*  MCV 97.5  98.2 98.1  PLT 151 135* 135*   Basic Metabolic Panel: Recent Labs  Lab 01/15/23 1552 01/16/23 0455 01/17/23 0325  NA 131* 130* 133*  K 4.2 3.4* 4.6  CL 94* 94* 97*  CO2 26 24 23   GLUCOSE 171* 108* 137*  BUN 31* 29* 32*  CREATININE 2.52* 2.52* 2.55*  CALCIUM 10.2 9.3 9.8   GFR: Estimated Creatinine Clearance: 15.8 mL/min (A) (by C-G formula based on SCr of 2.55 mg/dL (H)). Liver Function Tests: Recent Labs  Lab 01/15/23 1552 01/16/23 0455 01/17/23 0325  AST 46* 43* 62*  ALT 13 16 17   ALKPHOS 46 40 36*  BILITOT 8.7* 7.7* 7.9*  PROT 7.1 6.2* 6.3*  ALBUMIN 3.2* 2.5* 3.0*   Recent Labs  Lab 01/15/23 1552  LIPASE 47   Recent Labs  Lab 01/16/23 0803  AMMONIA 81*   Coagulation Profile: Recent Labs  Lab 01/16/23 0455  INR 1.6*   Cardiac Enzymes: No results for input(s): "CKTOTAL", "CKMB", "CKMBINDEX", "TROPONINI" in the last 168 hours. BNP (last 3 results) No results for input(s): "PROBNP" in the last 8760 hours. HbA1C: No results for input(s): "HGBA1C" in the last 72 hours. CBG: Recent Labs  Lab 01/16/23 0234 01/16/23 0725 01/16/23 1138 01/16/23 1616 01/17/23 0731  GLUCAP 124* 97 97 161* 126*   Lipid Profile: No results for input(s): "CHOL", "HDL", "LDLCALC", "TRIG", "CHOLHDL", "LDLDIRECT" in the last 72 hours. Thyroid Function Tests: No results for input(s): "TSH", "T4TOTAL", "FREET4", "T3FREE", "THYROIDAB" in the last 72 hours. Anemia Panel: Recent Labs    01/16/23 0515  FERRITIN 493*  TIBC 162*  IRON 84   Urine analysis:    Component Value Date/Time   COLORURINE YELLOW 01/16/2023 0506   APPEARANCEUR CLEAR 01/16/2023 0506   APPEARANCEUR Clear 10/28/2019 0900   LABSPEC 1.008 01/16/2023 0506   PHURINE 5.0 01/16/2023 0506   GLUCOSEU NEGATIVE 01/16/2023 0506   HGBUR NEGATIVE 01/16/2023 0506   BILIRUBINUR NEGATIVE 01/16/2023 0506   BILIRUBINUR Negative 10/28/2019 0900   KETONESUR NEGATIVE 01/16/2023 0506   PROTEINUR NEGATIVE 01/16/2023  0506   NITRITE NEGATIVE 01/16/2023 0506   LEUKOCYTESUR NEGATIVE 01/16/2023 0506   Sepsis Labs: @LABRCNTIP (procalcitonin:4,lacticidven:4)  ) Recent Results (from the past 240 hour(s))  Resp panel by RT-PCR (RSV, Flu A&B, Covid) Anterior Nasal Swab     Status: None   Collection Time: 01/15/23  9:53 PM   Specimen: Anterior Nasal Swab  Result Value Ref Range Status   SARS Coronavirus 2 by RT PCR NEGATIVE NEGATIVE Final    Comment: (NOTE) SARS-CoV-2 target nucleic acids are NOT DETECTED.  The SARS-CoV-2 RNA is generally detectable in upper respiratory specimens during the acute phase of infection. The lowest concentration of SARS-CoV-2 viral copies this assay can detect is 138 copies/mL. A negative result does not preclude SARS-Cov-2 infection and should not be used as the sole basis for treatment or other patient management decisions. A negative result may occur with  improper specimen collection/handling, submission of specimen other than nasopharyngeal swab, presence of viral mutation(s) within the areas targeted by this assay, and inadequate number of viral copies(<138 copies/mL). A negative result must be combined with clinical observations, patient history, and epidemiological information. The expected result is Negative.  Fact Sheet for Patients:  BloggerCourse.com  Fact Sheet for Healthcare Providers:  SeriousBroker.it  This test is no t yet approved or cleared by the [place] FDA and  has been authorized for detection and/or diagnosis of SARS-CoV-2 by FDA under an Emergency Use Authorization (EUA). This EUA will remain  in effect (meaning this test can be used) for the duration of the COVID-19 declaration under Section 564(b)(1) of the Act, 21 U.S.C.section 360bbb-3(b)(1), unless the authorization is terminated  or revoked sooner.       Influenza A by PCR NEGATIVE NEGATIVE Final   Influenza B by PCR NEGATIVE  NEGATIVE Final    Comment: (NOTE) The Xpert Xpress SARS-CoV-2/FLU/RSV plus assay is intended as an aid in the diagnosis of influenza from Nasopharyngeal swab specimens and should not be used as a sole basis for treatment. Nasal washings and aspirates are unacceptable for Xpert Xpress SARS-CoV-2/FLU/RSV testing.  Fact Sheet for Patients: BloggerCourse.com  Fact Sheet for Healthcare Providers: SeriousBroker.it  This test is not yet approved or cleared by the [place] FDA and has been authorized for detection and/or diagnosis of SARS-CoV-2 by FDA under an Emergency Use Authorization (EUA). This EUA will remain in effect (meaning this test can be used) for the duration of the COVID-19 declaration under Section 564(b)(1) of the Act, 21 U.S.C. section 360bbb-3(b)(1), unless the authorization is terminated or revoked.     Resp Syncytial Virus by PCR NEGATIVE NEGATIVE Final    Comment: (NOTE) Fact Sheet for Patients: BloggerCourse.com  Fact Sheet for Healthcare Providers: SeriousBroker.it  This test is not yet approved or cleared by the [place] FDA and has been authorized for detection and/or diagnosis of SARS-CoV-2 by FDA under an Emergency Use Authorization (EUA). This EUA will remain in effect (meaning this test can be used) for the duration of the COVID-19 declaration under Section 564(b)(1) of the Act, 21 U.S.C. section 360bbb-3(b)(1), unless the authorization is terminated or revoked.  Performed at [facility], [address] [place], [place] [address]   C Difficile Quick Screen w PCR reflex     Status: None   Collection Time: 01/16/23  3:17 AM   Specimen: STOOL  Result Value Ref Range Status   C Diff antigen NEGATIVE NEGATIVE Final   C Diff toxin NEGATIVE NEGATIVE Final   C Diff interpretation No C. difficile detected.  Final    Comment:  Performed at [facility], [address] [place], [place] [address]  Culture, blood (Routine X 2) w Reflex to ID Panel     Status: None (Preliminary result)   Collection Time: 01/16/23  4:55 AM   Specimen: BLOOD LEFT HAND  Result Value Ref Range Status   Specimen Description BLOOD LEFT HAND  Final   Special Requests   Final    BOTTLES DRAWN AEROBIC AND ANAEROBIC Blood Culture adequate volume   Culture   Final    NO GROWTH 1 DAY Performed at [facility], [address] [place], [place] [address]    Report Status PENDING  Incomplete  Culture, blood (Routine X 2) w Reflex to ID Panel     Status: None (Preliminary result)   Collection Time: 01/16/23  4:55 AM   Specimen: BLOOD  Result Value Ref Range Status   Specimen Description BLOOD LEFT ANTECUBITAL  Final   Special Requests   Final    BOTTLES DRAWN AEROBIC AND ANAEROBIC Blood Culture adequate volume   Culture   Final    NO GROWTH 1 DAY Performed at [facility]  [facility], [address] [place], [place] [address]    Report Status PENDING  Incomplete  Culture, body fluid w Gram Stain-bottle     Status: None (Preliminary result)   Collection Time: 01/16/23 11:29 AM   Specimen: Peritoneal Washings  Result Value Ref Range Status   Specimen Description PERITONEAL ABDOMEN  Final   Special Requests NONE  Final   Culture   Final    NO GROWTH < 24 HOURS Performed at [facility], [address], [place], [place] [address]    Report Status PENDING  Incomplete  Gram stain     Status: None   Collection Time: 01/16/23 11:29 AM   Specimen: Peritoneal Washings  Result Value Ref Range Status   Specimen Description PERITONEAL ABDOMEN  Final   Special Requests NONE  Final   Gram Stain   Final    WBC PRESENT, PREDOMINANTLY MONONUCLEAR NO ORGANISMS SEEN CYTOSPIN SMEAR Performed at [facility], [address], [place], [place] [address]    Report Status 01/16/2023 FINAL  Final     Radiology Studies: IR  Paracentesis  Result Date: 01/16/2023 INDICATION: 82 year old female. History of cirrhosis with portal hypertension, esophageal varices, CHF. Patient presents for therapeutic and diagnostic paracentesis EXAM: ULTRASOUND GUIDED THERAPEUTIC AND DIAGNOSTIC PARACENTESIS MEDICATIONS: Lidocaine 1% 10 mL COMPLICATIONS: None immediate. PROCEDURE: Informed written consent was obtained from the patient after a discussion of the risks, benefits and alternatives to treatment. A timeout was performed prior to the initiation of the procedure. Initial ultrasound scanning demonstrates a small amount of ascites within the right lower abdominal quadrant. The right lower abdomen was prepped and draped in the usual sterile fashion. 1% lidocaine was used for local anesthesia. Following this, a 19 gauge, 7-cm, Yueh catheter was introduced. An ultrasound image was saved for documentation purposes. The paracentesis was performed. The catheter was removed and a dressing was applied. The patient tolerated the procedure well without immediate post procedural complication. FINDINGS: A total of approximately 3 L of amber color fluid was removed. Samples were sent to the laboratory as requested by the clinical team. IMPRESSION: Successful ultrasound-guided therapeutic and diagnostic paracentesis yielding 3 liters of peritoneal fluid. Performed by Anders Grant NP PLAN: If the patient eventually requires >/=2 paracenteses in a 30 day period, candidacy for formal evaluation by the [facility] will be assessed. Electronically Signed   By: Gilmer Mor D.O.   On: 01/16/2023 14:06   ECHOCARDIOGRAM LIMITED  Result Date: 01/16/2023    ECHOCARDIOGRAM LIMITED REPORT   Patient Name:   Jessica Torres Date of Exam: 01/16/2023 Medical Rec #:  [id number]      Height:       65.0 in Accession #:    [id number]     Weight:       125.4 lb Date of Birth:  June 12, 1940      BSA:          1.622 m Patient Age:     82 years       BP:           114/54 mmHg Patient Gender: F              HR:           112 bpm. Exam Location:  Inpatient Procedure: Limited Echo, Color Doppler and Cardiac Doppler Indications:    acute systolic chf  History:        Patient has prior history of  Echocardiogram examinations, most                 recent 11/01/2022. Cardiomyopathy and CHF, CAD, chronic kidney                 disease and PAD, Signs/Symptoms:Murmur; Risk                 Factors:Hypertension, Dyslipidemia and Diabetes.  Sonographer:    Delcie Roch RDCS Referring Phys: [id number] SUBRINA SUNDIL IMPRESSIONS  1. LV function is severely depressed There is thickening of the basal lateral,base/mid anterior, basal inferior and base/mid septal walls. Akinetic elsewhere .Marland Kitchen Left ventricular ejection fraction, by estimation, is 20 to 25%. The left ventricle has severely decreased function. The left ventricle demonstrates global hypokinesis.  2. Right ventricular systolic function is normal. The right ventricular size is normal. There is moderately elevated pulmonary artery systolic pressure.  3. Moderate to severe mitral valve regurgitation.  4. The aortic valve is tricuspid. Aortic valve regurgitation is trivial. Aortic valve sclerosis/calcification is present, without any evidence of aortic stenosis.  5. The inferior vena cava is dilated in size with <50% respiratory variability, suggesting right atrial pressure of 15 mmHg. FINDINGS  Left Ventricle: LV function is severely depressed There is thickening of the basal lateral,base/mid anterior, basal inferior and base/mid septal walls. Akinetic elsewhere. Left ventricular ejection fraction, by estimation, is 20 to 25%. The left ventricle has severely decreased function. The left ventricle demonstrates global hypokinesis. Right Ventricle: The right ventricular size is normal. Right vetricular wall thickness was not assessed. Right ventricular systolic function is normal. There is moderately  elevated pulmonary artery systolic pressure. The tricuspid regurgitant velocity is  3.33 m/s, and with an assumed right atrial pressure of 8 mmHg, the estimated right ventricular systolic pressure is 52.4 mmHg. Left Atrium: Left atrial size was normal in size. Right Atrium: Right atrial size was normal in size. Pericardium: There is no evidence of pericardial effusion. Mitral Valve: Moderate to severe mitral valve regurgitation. Tricuspid Valve: The tricuspid valve is normal in structure. Tricuspid valve regurgitation is trivial. Aortic Valve: The aortic valve is tricuspid. Aortic valve regurgitation is trivial. Aortic valve sclerosis/calcification is present, without any evidence of aortic stenosis. Aorta: The aortic root and ascending aorta are structurally normal, with no evidence of dilitation. Venous: The inferior vena cava is dilated in size with less than 50% respiratory variability, suggesting right atrial pressure of 15 mmHg. Additional Comments: Spectral Doppler performed. Color Doppler performed.  LEFT VENTRICLE PLAX 2D LVIDd:         5.00 cm LVIDs:         4.70 cm LV PW:         0.90 cm LV IVS:        0.60 cm LVOT diam:     1.40 cm LV SV:         32 LV SV Index:   20 LVOT Area:     1.54 cm  LV Volumes (MOD) LV vol d, MOD A4C: 120.0 ml LV vol s, MOD A4C: 80.8 ml LV SV MOD A4C:     120.0 ml IVC IVC diam: 1.90 cm LEFT ATRIUM         Index LA diam:    3.20 cm 1.97 cm/m  AORTIC VALVE LVOT Vmax:   116.00 cm/s LVOT Vmean:  81.700 cm/s LVOT VTI:    0.208 m  AORTA Ao Asc diam: 2.90 cm MR Peak grad:    69.2 mmHg    TRICUSPID  VALVE MR Mean grad:    39.0 mmHg    TR Peak grad:   44.4 mmHg MR Vmax:         416.00 cm/s  TR Vmax:        333.00 cm/s MR Vmean:        281.0 cm/s MR PISA:         1.57 cm     SHUNTS MR PISA Eff ROA: 15 mm       Systemic VTI:  0.21 m MR PISA Radius:  0.50 cm      Systemic Diam: 1.40 cm Dietrich Pates MD Electronically signed by Dietrich Pates MD Signature Date/Time: 01/16/2023/1:43:45 PM    Final     DG Chest 2 View  Result Date: 01/15/2023 CLINICAL DATA:  Shortness of breath after COVID vaccination. EXAM: CHEST - 2 VIEW COMPARISON:  Chest radiograph 11/12/2022. Lung bases from concurrent abdominopelvic CT. FINDINGS: Moderate right pleural effusion, increased from prior exam. There is a small left pleural effusion, improved from September exam. Heart is upper normal in size. Interstitial thickening may represent edema or be chronic, and is similar to prior exam. No pneumothorax. IMPRESSION: 1. Moderate right pleural effusion, increased from prior exam. Small left pleural effusion, improved from September exam. 2. Interstitial thickening may represent edema or be chronic. Electronically Signed   By: Narda Rutherford M.D.   On: 01/15/2023 19:51   CT ABDOMEN PELVIS WO CONTRAST  Result Date: 01/15/2023 CLINICAL DATA:  Acute abdominal pain.  Diarrhea. EXAM: CT ABDOMEN AND PELVIS WITHOUT CONTRAST TECHNIQUE: Multidetector CT imaging of the abdomen and pelvis was performed following the standard protocol without IV contrast. RADIATION DOSE REDUCTION: This exam was performed according to the departmental dose-optimization program which includes automated exposure control, adjustment of the mA and/or kV according to patient size and/or use of iterative reconstruction technique. COMPARISON:  None Available. FINDINGS: Lower chest: Moderate right and small left pleural effusion. Associated compressive atelectasis in the right lower, right middle, and left lower lobes. The heart is upper normal in size, there is left ventricular dilatation. Hepatobiliary: Nodular hepatic contours typical of cirrhosis. No evidence of focal liver abnormality on this unenhanced exam. Small gallstones within the gallbladder. No biliary dilatation. Pancreas: No ductal dilatation or inflammation. Spleen: No splenomegaly. Adrenals/Urinary Tract: No adrenal nodule. Mild bilateral renal parenchymal atrophy. No hydronephrosis. No renal  calculi. Urinary bladder is minimally distended, grossly normal for degree of distension. Stomach/Bowel: Detailed bowel assessment is limited due to the presence of ascites and lack of contrast. Equivocal gastric wall thickening. Equivocal duodenal wall thickening. There are duodenal diverticula. No small bowel distension. Normal appendix. Multifocal colonic diverticulosis without evidence of focal diverticulitis. There is colonic wall thickening from the transverse colon through the distal descending colon. Small volume of formed colonic stool. Vascular/Lymphatic: Aortic and branch atherosclerosis. Retroaortic left renal vein. Suspected recannulated umbilical vein, not well assessed in the absence of IV contrast. There is no obvious bulky abdominopelvic adenopathy, although ascites limits detailed assessment. Reproductive: The uterus is not seen, presumed hysterectomy. No evidence of adnexal mass. Other: Moderate to large volume abdominopelvic ascites. Ascitic fluid is simple in density. Generalized edema of the subcutaneous and intra-abdominal fat. No free air. Musculoskeletal: Mild scoliosis and degenerative change in the spine. There are no acute or suspicious osseous abnormalities. IMPRESSION: 1. 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 2. Colonic wall thickening from the transverse colon through the distal descending colon, suspicious for colitis. This may be infectious or  inflammatory. Distribution is not typical for portal colopathy. 3. Colonic diverticulosis without evidence of focal diverticulitis. 4. Cholelithiasis. 5. Moderate right and small left pleural effusions with associated compressive atelectasis. Aortic Atherosclerosis (ICD10-I70.0). Electronically Signed   By: Narda Rutherford M.D.   On: 01/15/2023 19:50     Scheduled Meds:  feeding supplement  237 mL Oral BID BM   furosemide  40 mg Intravenous BID   lactulose  20 g Oral BID   levothyroxine  88 mcg Oral QAC  breakfast   metoprolol succinate  12.5 mg Oral Daily   pantoprazole  40 mg Oral Daily   Continuous Infusions:  albumin human     cefTRIAXone (ROCEPHIN)  IV 2 g (01/17/23 0557)   metronidazole 500 mg (01/17/23 9562)     LOS: 1 day    Time spent:    Zannie Cove, MD [facility]   01/17/2023, 10:55 AM

## 2023-01-18 DIAGNOSIS — K7290 Hepatic failure, unspecified without coma: Secondary | ICD-10-CM | POA: Diagnosis not present

## 2023-01-18 DIAGNOSIS — Z515 Encounter for palliative care: Secondary | ICD-10-CM | POA: Diagnosis not present

## 2023-01-18 DIAGNOSIS — R188 Other ascites: Secondary | ICD-10-CM | POA: Diagnosis not present

## 2023-01-18 DIAGNOSIS — J90 Pleural effusion, not elsewhere classified: Secondary | ICD-10-CM | POA: Diagnosis not present

## 2023-01-18 DIAGNOSIS — I5023 Acute on chronic systolic (congestive) heart failure: Secondary | ICD-10-CM | POA: Diagnosis not present

## 2023-01-18 DIAGNOSIS — K7460 Unspecified cirrhosis of liver: Secondary | ICD-10-CM | POA: Diagnosis not present

## 2023-01-18 LAB — COMPREHENSIVE METABOLIC PANEL
ALT: 17 U/L (ref 0–44)
AST: 53 U/L — ABNORMAL HIGH (ref 15–41)
Albumin: 3.6 g/dL (ref 3.5–5.0)
Alkaline Phosphatase: 32 U/L — ABNORMAL LOW (ref 38–126)
Anion gap: 12 (ref 5–15)
BUN: 38 mg/dL — ABNORMAL HIGH (ref 8–23)
CO2: 23 mmol/L (ref 22–32)
Calcium: 9.8 mg/dL (ref 8.9–10.3)
Chloride: 95 mmol/L — ABNORMAL LOW (ref 98–111)
Creatinine, Ser: 2.92 mg/dL — ABNORMAL HIGH (ref 0.44–1.00)
GFR, Estimated: 16 mL/min — ABNORMAL LOW (ref >60)
Glucose, Bld: 201 mg/dL — ABNORMAL HIGH (ref 70–99)
Potassium: 3.7 mmol/L (ref 3.5–5.1)
Sodium: 130 mmol/L — ABNORMAL LOW (ref 135–145)
Total Bilirubin: 8 mg/dL — ABNORMAL HIGH (ref <1.2)
Total Protein: 6.9 g/dL (ref 6.5–8.1)

## 2023-01-18 LAB — CERULOPLASMIN: Ceruloplasmin: 17.2 mg/dL — ABNORMAL LOW (ref 19.0–39.0)

## 2023-01-18 LAB — ALPHA-1-ANTITRYPSIN: A-1 Antitrypsin, Ser: 160 mg/dL (ref 101–187)

## 2023-01-18 NOTE — Progress Notes (Addendum)
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 -In the ED she was tachycardic, blood pressure 150/62, lactate 1.9, labs with sodium 131, creatinine 2.5, albumin 3.2, hemoglobin 11.3, WBC 8.9, troponin 78, 69, bili 3.9, BNP 2619, chest x-ray with moderate right pleural effusion and interstitial thickening?  Edema CT abdomen pelvis with cirrhosis, moderate to large volume ascites, colon wall thickening?  Colitis, moderate right and small left effusions -11/8, paracentesis 3 L of amber fluid drained -11/9, palliative care meeting, continue DNR/DNI, family is cautiously optimistic and hopeful for improvement  Subjective: -Feels so-so, no events overnight, dyspnea with any activity  Assessment and Plan:  Acute on chronic systolic CHF Severe mitral regurgitation, suspected CAD -Worsened in the background of decompensated cirrhosis and CKD 4 -Seventh hospitalization this year with volume overload, prognosis remains poor -Poor response to diuretics, creatinine continues to worsen, continue IV Lasix 80 Mg twice daily, metoprolol, will request cardiology input -Very poor prognosis with significant chronic disease burden, called and updated son Thayer Ohm, she may need hospice services soon -GDMT limited by CKD 4 -Palliative care team following  Decompensated liver cirrhosis, ascites History of esophageal varices Mild hepatic encephalopathy -Ultrasound paracentesis completed  - 3 L drained, fluid not suggestive of SBP -Discontinued antibiotics, diuretics as noted above -Ammonia level is elevated, started on lactulose  AKI on CKD 4 Hyponatremia -Baseline creatinine is around 2 -Now 2.9, likely cardiorenal, given multiple doses of IV albumin as well this admission -Remains volume overloaded, diuretics as above, avoid hypotension  Colon wall thickening involving transverse and distal colon -Suspect this is related to ascites, low albumin -Ascitic fluid not suggestive of SBP will discontinue antibiotics and monitor  Chronic anemia, thrombocytopenia Relatively stable monitor  Type 2 diabetes mellitus - HbA1c was 5.4 in September, on glargine at baseline, SSI for now  Chronic hypoxic respiratory failure on 2 L home O2 at baseline, stable  Hypothyroidism  -continue Synthroid  DVT prophylaxis: SCDs Code Status: DNR  Family Communication: No family at bedside, was unable to reach son Debera Lat, will try again today Disposition Plan: To be determined  Consultants:    Procedures:   Antimicrobials:    Objective: Vitals:   01/17/23 1737 01/17/23 1933 01/18/23 0323 01/18/23 0915  BP: (!) 95/53 (!) 98/54 (!) 105/57 104/60  Pulse:  (!) 105 (!) 113 (!) 109  Resp:  18 12 12   Temp:   98.6 F (37 C) 98 F (36.7 C)  TempSrc: Oral  Oral Oral  SpO2:  92% 92% 95%  Weight:   57.8 kg   Height:        Intake/Output Summary (Last 24 hours) at 01/18/2023 1045 Last data filed at 01/17/2023 1941 Gross per 24 hour  Intake --  Output 150 ml  Net -150 ml   Filed Weights   01/15/23 1546 01/17/23 0351 01/18/23 0323  Weight: 56.9 kg 58.4 kg 57.8 kg    Examination:  General exam: Chronically ill debilitated cachectic female laying in bed, AAOx3 HEENT: Positive JVD CVS: S1-S2, regular rhythm Lungs:  Decreased breath sounds at the bases Abdomen: Soft, nontender, less distended, bowel sounds present Extremities: Positive edema  Psychiatry: Flat affect    Data  Reviewed:   CBC: Recent Labs  Lab 01/15/23 1552 01/16/23 0455 01/17/23 0325  WBC 8.9 8.2 9.7  NEUTROABS 6.4  --   --   HGB 11.3* 9.7* 9.1*  HCT 31.3* 27.0* 25.8*  MCV 97.5 98.2 98.1  PLT 151 135* 135*   Basic Metabolic Panel: Recent Labs  Lab 01/15/23 1552 01/16/23 0455 01/17/23 0325 01/18/23 0423  NA 131* 130* 133* 130*  K 4.2 3.4* 4.6 3.7  CL 94* 94* 97* 95*  CO2 26 24 23 23   GLUCOSE 171* 108* 137* 201*  BUN 31* 29* 32* 38*  CREATININE 2.52* 2.52* 2.55* 2.92*  CALCIUM 10.2 9.3 9.8 9.8   GFR: Estimated Creatinine Clearance: 13.8 mL/min (A) (by C-G formula based on SCr of 2.92 mg/dL (H)). Liver Function Tests: Recent Labs  Lab 01/15/23 1552 01/16/23 0455 01/17/23 0325 01/18/23 0423  AST 46* 43* 62* 53*  ALT 13 16 17 17   ALKPHOS 46 40 36* 32*  BILITOT 8.7* 7.7* 7.9* 8.0*  PROT 7.1 6.2* 6.3* 6.9  ALBUMIN 3.2* 2.5* 3.0* 3.6   Recent Labs  Lab 01/15/23 1552  LIPASE 47   Recent Labs  Lab 01/16/23 0803  AMMONIA 81*   Coagulation Profile: Recent Labs  Lab 01/16/23 0455  INR 1.6*   Cardiac Enzymes: No results for input(s): "CKTOTAL", "CKMB", "CKMBINDEX", "TROPONINI" in the last 168 hours. BNP (last 3 results) No results for input(s): "PROBNP" in the last 8760 hours. HbA1C: No results for input(s): "HGBA1C" in the last 72 hours. CBG: Recent Labs  Lab 01/16/23 0725 01/16/23 1138 01/16/23 1616 01/17/23 0731 01/17/23 1156  GLUCAP 97 97 161* 126* 199*   Lipid Profile: No results for input(s): "CHOL", "HDL", "LDLCALC", "TRIG", "CHOLHDL", "LDLDIRECT" in the last 72 hours. Thyroid Function Tests: No results for input(s): "TSH", "T4TOTAL", "FREET4", "T3FREE", "THYROIDAB" in the last 72 hours. Anemia Panel: Recent Labs    01/16/23 0515  FERRITIN 493*  TIBC 162*  IRON 84   Urine analysis:    Component Value Date/Time   COLORURINE YELLOW 01/16/2023 0506   APPEARANCEUR CLEAR 01/16/2023 0506   APPEARANCEUR Clear 10/28/2019 0900   LABSPEC  1.008 01/16/2023 0506   PHURINE 5.0 01/16/2023 0506   GLUCOSEU NEGATIVE 01/16/2023 0506   HGBUR NEGATIVE 01/16/2023 0506   BILIRUBINUR NEGATIVE 01/16/2023 0506   BILIRUBINUR Negative 10/28/2019 0900   KETONESUR NEGATIVE 01/16/2023 0506   PROTEINUR NEGATIVE 01/16/2023 0506   NITRITE NEGATIVE 01/16/2023 0506   LEUKOCYTESUR NEGATIVE 01/16/2023 0506   Sepsis Labs: @LABRCNTIP (procalcitonin:4,lacticidven:4)  ) Recent Results (from the past 240 hour(s))  Resp panel by RT-PCR (RSV, Flu A&B, Covid) Anterior Nasal Swab     Status: None   Collection Time: 01/15/23  9:53 PM   Specimen: Anterior Nasal Swab  Result Value Ref Range Status   SARS Coronavirus 2 by RT PCR NEGATIVE NEGATIVE Final    Comment: (NOTE) SARS-CoV-2 target nucleic acids are NOT DETECTED.  The SARS-CoV-2 RNA is generally detectable in upper respiratory specimens during the acute phase of infection. The lowest concentration of SARS-CoV-2 viral copies this assay can detect is 138 copies/mL. A negative result does not preclude SARS-Cov-2 infection and should not be used as the sole basis for treatment or other patient management decisions. A negative result may occur with  improper specimen collection/handling, submission of specimen other than nasopharyngeal swab, presence of  viral mutation(s) within the areas targeted by this assay, and inadequate number of viral copies(<138 copies/mL). A negative result must be combined with clinical observations, patient history, and epidemiological information. The expected result is Negative.  Fact Sheet for Patients:  BloggerCourse.com  Fact Sheet for Healthcare Providers:  SeriousBroker.it  This test is no t yet approved or cleared by the [place] FDA and  has been authorized for detection and/or diagnosis of SARS-CoV-2 by FDA under an Emergency Use Authorization (EUA). This EUA will remain  in effect (meaning this test  can be used) for the duration of the COVID-19 declaration under Section 564(b)(1) of the Act, 21 U.S.C.section 360bbb-3(b)(1), unless the authorization is terminated  or revoked sooner.       Influenza A by PCR NEGATIVE NEGATIVE Final   Influenza B by PCR NEGATIVE NEGATIVE Final    Comment: (NOTE) The Xpert Xpress SARS-CoV-2/FLU/RSV plus assay is intended as an aid in the diagnosis of influenza from Nasopharyngeal swab specimens and should not be used as a sole basis for treatment. Nasal washings and aspirates are unacceptable for Xpert Xpress SARS-CoV-2/FLU/RSV testing.  Fact Sheet for Patients: BloggerCourse.com  Fact Sheet for Healthcare Providers: SeriousBroker.it  This test is not yet approved or cleared by the [place] FDA and has been authorized for detection and/or diagnosis of SARS-CoV-2 by FDA under an Emergency Use Authorization (EUA). This EUA will remain in effect (meaning this test can be used) for the duration of the COVID-19 declaration under Section 564(b)(1) of the Act, 21 U.S.C. section 360bbb-3(b)(1), unless the authorization is terminated or revoked.     Resp Syncytial Virus by PCR NEGATIVE NEGATIVE Final    Comment: (NOTE) Fact Sheet for Patients: BloggerCourse.com  Fact Sheet for Healthcare Providers: SeriousBroker.it  This test is not yet approved or cleared by the [place] FDA and has been authorized for detection and/or diagnosis of SARS-CoV-2 by FDA under an Emergency Use Authorization (EUA). This EUA will remain in effect (meaning this test can be used) for the duration of the COVID-19 declaration under Section 564(b)(1) of the Act, 21 U.S.C. section 360bbb-3(b)(1), unless the authorization is terminated or revoked.  Performed at [facility], [address], [place], [place] [address]   C Difficile Quick Screen w  PCR reflex     Status: None   Collection Time: 01/16/23  3:17 AM   Specimen: STOOL  Result Value Ref Range Status   C Diff antigen NEGATIVE NEGATIVE Final   C Diff toxin NEGATIVE NEGATIVE Final   C Diff interpretation No C. difficile detected.  Final    Comment: Performed at [facility], [address], [place], [place] [address]  Gastrointestinal Panel by PCR , Stool     Status: None   Collection Time: 01/16/23  3:17 AM   Specimen: Stool  Result Value Ref Range Status   Campylobacter species NOT DETECTED NOT DETECTED Final   Plesimonas shigelloides NOT DETECTED NOT DETECTED Final   Salmonella species NOT DETECTED NOT DETECTED Final   Yersinia enterocolitica NOT DETECTED NOT DETECTED Final   Vibrio species NOT DETECTED NOT DETECTED Final   Vibrio cholerae NOT DETECTED NOT DETECTED Final   Enteroaggregative E coli (EAEC) NOT DETECTED NOT DETECTED Final   Enteropathogenic E coli (EPEC) NOT DETECTED NOT DETECTED Final   Enterotoxigenic E coli (ETEC) NOT DETECTED NOT DETECTED Final   Shiga like toxin producing E coli (STEC) NOT DETECTED NOT DETECTED Final   Shigella/Enteroinvasive E coli (EIEC) NOT  DETECTED NOT DETECTED Final   Cryptosporidium NOT DETECTED NOT DETECTED Final   Cyclospora cayetanensis NOT DETECTED NOT DETECTED Final   Entamoeba histolytica NOT DETECTED NOT DETECTED Final   Giardia lamblia NOT DETECTED NOT DETECTED Final   Adenovirus F40/41 NOT DETECTED NOT DETECTED Final   Astrovirus NOT DETECTED NOT DETECTED Final   Norovirus GI/GII NOT DETECTED NOT DETECTED Final   Rotavirus A NOT DETECTED NOT DETECTED Final   Sapovirus (I, II, IV, and V) NOT DETECTED NOT DETECTED Final    Comment: Performed at [facility], [address] [place], [place] [address]  Culture, blood (Routine X 2) w Reflex to ID Panel     Status: None (Preliminary result)   Collection Time: 01/16/23  4:55 AM   Specimen: BLOOD LEFT HAND  Result Value Ref Range Status   Specimen  Description BLOOD LEFT HAND  Final   Special Requests   Final    BOTTLES DRAWN AEROBIC AND ANAEROBIC Blood Culture adequate volume   Culture   Final    NO GROWTH 2 DAYS Performed at [facility], [address], [place], [place] [address]    Report Status PENDING  Incomplete  Culture, blood (Routine X 2) w Reflex to ID Panel     Status: None (Preliminary result)   Collection Time: 01/16/23  4:55 AM   Specimen: BLOOD  Result Value Ref Range Status   Specimen Description BLOOD LEFT ANTECUBITAL  Final   Special Requests   Final    BOTTLES DRAWN AEROBIC AND ANAEROBIC Blood Culture adequate volume   Culture   Final    NO GROWTH 2 DAYS Performed at [facility], [address] [place], [place] [address]    Report Status PENDING  Incomplete  Culture, body fluid w Gram Stain-bottle     Status: None (Preliminary result)   Collection Time: 01/16/23 11:29 AM   Specimen: Peritoneal Washings  Result Value Ref Range Status   Specimen Description PERITONEAL ABDOMEN  Final   Special Requests NONE  Final   Culture   Final    NO GROWTH 2 DAYS Performed at [facility], [address] [place], [place] [address]    Report Status PENDING  Incomplete  Gram stain     Status: None   Collection Time: 01/16/23 11:29 AM   Specimen: Peritoneal Washings  Result Value Ref Range Status   Specimen Description PERITONEAL ABDOMEN  Final   Special Requests NONE  Final   Gram Stain   Final    WBC PRESENT, PREDOMINANTLY MONONUCLEAR NO ORGANISMS SEEN CYTOSPIN SMEAR Performed at [facility], [address] [place], [place] [address]    Report Status 01/16/2023 FINAL  Final     Radiology Studies: IR Paracentesis  Result Date: 01/16/2023 INDICATION: 82 year old female. History of cirrhosis with portal hypertension, esophageal varices, CHF. Patient presents for therapeutic and diagnostic paracentesis EXAM: ULTRASOUND GUIDED THERAPEUTIC AND DIAGNOSTIC PARACENTESIS MEDICATIONS:  Lidocaine 1% 10 mL COMPLICATIONS: None immediate. PROCEDURE: Informed written consent was obtained from the patient after a discussion of the risks, benefits and alternatives to treatment. A timeout was performed prior to the initiation of the procedure. Initial ultrasound scanning demonstrates a small amount of ascites within the right lower abdominal quadrant. The right lower abdomen was prepped and draped in the usual sterile fashion. 1% lidocaine was used for local anesthesia. Following this, a 19 gauge, 7-cm, Yueh catheter was introduced. An ultrasound image was saved for documentation purposes.  The paracentesis was performed. The catheter was removed and a dressing was applied. The patient tolerated the procedure well without immediate post procedural complication. FINDINGS: A total of approximately 3 L of amber color fluid was removed. Samples were sent to the laboratory as requested by the clinical team. IMPRESSION: Successful ultrasound-guided therapeutic and diagnostic paracentesis yielding 3 liters of peritoneal fluid. Performed by Anders Grant NP PLAN: If the patient eventually requires >/=2 paracenteses in a 30 day period, candidacy for formal evaluation by the [facility] will be assessed. Electronically Signed   By: Gilmer Mor D.O.   On: 01/16/2023 14:06     Scheduled Meds:  feeding supplement  237 mL Oral BID BM   furosemide  80 mg Intravenous BID   lactulose  20 g Oral BID   levothyroxine  88 mcg Oral QAC breakfast   metoprolol succinate  12.5 mg Oral Daily   pantoprazole  40 mg Oral Daily   Continuous Infusions:     LOS: 2 days    Time spent:    Zannie Cove, MD [facility]   01/18/2023, 10:45 AM

## 2023-01-18 NOTE — Progress Notes (Signed)
Daily Progress Note   Patient Name: Jessica Torres       Date: 01/18/2023 DOB: [birth year]  Age: 82 y.o. MRN#: [id number] Attending Physician: Zannie Cove, MD Primary Care Physician: Rebekah Chesterfield, NP Admit Date: 01/15/2023  Reason for Consultation/Follow-up: Establishing goals of care  Subjective: Medical records reviewed including progress notes, labs, imaging. Patient assessed at the bedside.  She reports feeling okay for now.  Not making much urine at all.  Discussed with RN.  Patient's sister is present visiting.  I then returned bedside to meet with patient's son and daughter-in-law as well.  Patient's family shared that after discussion with primary attending and further conversation as family, they would like to proceed with hospice referral today.  They would like her to spend her limited time as close to family as possible and they understand that they can no longer care for her in her current state.  Reviewed the hospice referral process and eligibility criteria, anticipated approval given her worsening condition and multi organ dysfunction.  They prefer [facility] in [place], as they have other family members who have died there in the past.  Counseling option of comfort focused care while making arrangements for hospice referral, explaining that patient would no longer receive aggressive medical interventions such as continuous vital signs, lab work, radiology testing, or medications not focused on comfort. All care would focus on how the patient is looking and feeling. This would include management of any symptoms that may cause discomfort, pain, shortness of breath, cough, nausea, agitation, anxiety, and/or secretions etc. Symptoms would be managed  with medications and other non-pharmacological interventions such as spiritual support if requested, repositioning, music therapy, or therapeutic listening. Family verbalized understanding and appreciation.  They would prefer to continue with current care until hospice is able to take over.   Questions and concerns addressed. PMT will continue to support holistically.   Length of Stay: 2   Physical Exam Vitals and nursing note reviewed.  Constitutional:      General: She is not in acute distress.    Appearance: She is ill-appearing.     Interventions: Nasal cannula in place.  Cardiovascular:     Rate and Rhythm: Tachycardia present.  Pulmonary:     Effort: Pulmonary effort is normal.  Skin:    General: Skin  is warm and dry.  Neurological:     Mental Status: She is lethargic.           Vital Signs: BP (!) 105/57 (BP Location: Left Arm)   Pulse (!) 113   Temp 98.6 F (37 C) (Oral)   Resp 12   Ht 5\' 5"  (1.651 m)   Wt 57.8 kg   SpO2 92%   BMI 21.20 kg/m  SpO2: SpO2: 92 % O2 Device: O2 Device: Nasal Cannula O2 Flow Rate: O2 Flow Rate (L/min): 3 L/min      Palliative Assessment/Data: 20%   Palliative Care Assessment & Plan   Patient Profile: 82 y.o. female  with past medical history of chronic systolic heart failure with reduced EF 30 to 35%, CAD, DM type II, CKD stage V, hypothyroidism, gout, hepatic cirrhosis with history of esophageal varices, chronic hypoxic respiratory failure 2 L oxygen at baseline, chronic anemia and chronic thrombocytopenia admitted on 01/15/2023 with shortness of breath, cough abdominal pain, diarrhea, BL LE swelling and distention.    Patient has been admitted 3 times in the past 6 months for recurrent exacerbations of her chronic diseases.  She is now admitted for acute CHF exacerbation, MI type II, AKI on CKD 4, decompensated cirrhosis, transaminitis, hyperbilirubinemia, initial concern for SBP/colitis, and bilateral pleural effusion.   Patient is  status post paracentesis 11/8 with 3 L of fluid removed. PMT has been consulted to assist with goals of care conversation.  Assessment: Goals of care conversation Acute on chronic systolic CHF Severe MR Decompensated liver cirrhosis with ascites status post paracentesis AKI on CKD 4, worsening Chronic hypoxic respiratory failure  Recommendations/Plan: Continue DNR/DNI Continue current care plan while making arrangements for hospice referral Family is ready for referral to [facility].  TOC assistance appreciated.  LCSW will request follow-up tomorrow Psychosocial emotional support provided PMT to continue to follow and support as needed.  Please call team line should additional palliative needs arise   Prognosis:  < 2 weeks  Discharge Planning: Hospice facility  Care plan was discussed with patient, patient's family, MD, TOC, RN   MDM high         Janae Bonser Jeni Salles, PA-C  Palliative Medicine Team Team phone # [phone]  Thank you for allowing the Palliative Medicine Team to assist in the care of this patient. Please utilize secure chat with additional questions, if there is no response within 30 minutes please call the above phone number.  Palliative Medicine Team providers are available by phone from 7am to 7pm daily and can be reached through the team cell phone.  Should this patient require assistance outside of these hours, please call the patient's attending physician.

## 2023-01-18 NOTE — Consult Note (Signed)
Cardiology Consultation   Patient ID: [name] MRN: [id number]; DOB: Sep 08, 1940  Admit date: 01/15/2023 Date of Consult: 01/18/2023  PCP:  Rebekah Chesterfield, NP   [facility] Providers Cardiologist:  Dietrich Pates, MD       History of Present Illness:   Jessica Torres is a 82 y.o. female with a history of systolic heart failure (LVEF 30-35%), severe MR, CKD 4, liver cirrhosis, portal hypertension, esophageal varices, presumed CAD, chronic respiratory failure on 2 L O2, type 2 diabetes mellitus, history of chronic blood loss anemia and GAVE's disease, history of CVA, hypothyroidism with multiple hospitalizations who is currnetly admitted for shortness of breath, cough, lower abdominal pain, diarrhea.  Since admission on 11/8, patient has undergone paracentesis with 3 L of amber fluid drained.  Palliative care has gotten involved in his assisting with goals of care discussions.  Primary team continuing with diuresis.   Past Medical History:  Diagnosis Date   Arthritis    "fingers probably" (10/30/2016)   Bulging lumbar disc    Chronic lower back pain    Difficulty urinating    GERD (gastroesophageal reflux disease)    Heart murmur    hx   HFrEF (heart failure with reduced ejection fraction) (HCC)    a. EF 40 to 45% by echocardiogram in 07/2022 b. EF at 30 to 35% by imaging in 10/2022   Hyperlipidemia    statin intolerant   Hypertension    Hypothyroidism    PAD (peripheral artery disease) (HCC)    Sleep apnea    "dx'd years ago; had severe problems; all of the sudden it just went away" (07/06/2014) *still gone" (10/30/2016)   Stroke [facility]) ~ 2010   denies residual on 10/30/2016   Type 2 diabetes mellitus (HCC)    Urgency of urination    Uterine cancer [facility])     Past Surgical History:  Procedure Laterality Date   ANGIOPLASTY Right 01/22/2015   w/ atherectomy R sfa   BALLOON ANGIOPLASTY, ARTERY Right    SFA/notes 07/06/2014   BIOPSY  04/28/2022   Procedure:  BIOPSY;  Surgeon: Tressia Danas, MD;  Location: [facility] ENDOSCOPY;  Service: Gastroenterology;;   BREAST EXCISIONAL BIOPSY Left    BREAST SURGERY     CARPAL TUNNEL RELEASE Right 1980's   COLONOSCOPY WITH PROPOFOL N/A 11/26/2020   Procedure: COLONOSCOPY WITH PROPOFOL;  Surgeon: Lanelle Bal, DO;  Location: [facility] ENDO SUITE;  Service: Endoscopy;  Laterality: N/A;  9:00am, pt cannot come in earlier   DILATION AND CURETTAGE OF UTERUS     ESOPHAGOGASTRODUODENOSCOPY N/A 04/28/2022   Procedure: ESOPHAGOGASTRODUODENOSCOPY (EGD);  Surgeon: Tressia Danas, MD;  Location: [facility] ENDOSCOPY;  Service: Gastroenterology;  Laterality: N/A;   ESOPHAGOGASTRODUODENOSCOPY (EGD) WITH PROPOFOL N/A 04/30/2022   Procedure: ESOPHAGOGASTRODUODENOSCOPY (EGD) WITH PROPOFOL;  Surgeon: Tressia Danas, MD;  Location: [facility] ENDOSCOPY;  Service: Gastroenterology;  Laterality: N/A;  With [facility]   ESOPHAGOGASTRODUODENOSCOPY (EGD) WITH PROPOFOL N/A 07/15/2022   Procedure: ESOPHAGOGASTRODUODENOSCOPY (EGD) WITH PROPOFOL;  Surgeon: Dolores Frame, MD;  Location: [facility] ENDO SUITE;  Service: Gastroenterology;  Laterality: N/A;   HEMORRHOID SURGERY     "dr lanced it; in dr's office"   HOT HEMOSTASIS N/A 04/30/2022   Procedure: HOT HEMOSTASIS (ARGON PLASMA COAGULATION/BICAP);  Surgeon: Tressia Danas, MD;  Location: [facility] ENDOSCOPY;  Service: Gastroenterology;  Laterality: N/A;   HOT HEMOSTASIS  07/15/2022   Procedure: HOT HEMOSTASIS (ARGON PLASMA COAGULATION/BICAP);  Surgeon: Marguerita Merles, Reuel Boom, MD;  Location: [facility] ENDO SUITE;  Service:  Gastroenterology;;   INCISION AND DRAINAGE BREAST ABSCESS Left    IR PARACENTESIS  01/16/2023   LOWER EXTREMITY ANGIOGRAM N/A 07/06/2014   Procedure: LOWER EXTREMITY ANGIOGRAM & R-SFA atherectomy and balloon angioplasty;  Surgeon: Runell Gess, MD;  Location: [facility] CATH LAB;  Service: Cardiovascular;  Laterality: R   LOWER EXTREMITY ANGIOGRAM  10/30/2016   LOWER EXTREMITY ANGIOGRAPHY N/A 10/30/2016    Procedure: Lower Extremity Angiography;  Surgeon: Runell Gess, MD;  Location: [facility] INVASIVE CV LAB;  Service: Cardiovascular;  Laterality: N/A;   PERIPHERAL VASCULAR CATHETERIZATION N/A 08/17/2014   Procedure: Lower Extremity Angiography;  Surgeon: Runell Gess, MD;  Location: [facility] INVASIVE CV LAB;  Service: Cardiovascular;  Laterality: N/A;   PERIPHERAL VASCULAR CATHETERIZATION Left 08/17/2014   Procedure: Peripheral Vascular Atherectomy and Viabahn stent L-SFA;  Surgeon: Runell Gess, MD; Laterality: Left; L-SFA   PERIPHERAL VASCULAR CATHETERIZATION N/A 01/11/2015   Procedure: Lower Extremity Angiography;  Surgeon: Runell Gess, MD;  Location: [facility] INVASIVE CV LAB;  Service: Cardiovascular;  Laterality: N/A;   PERIPHERAL VASCULAR CATHETERIZATION Right 01/22/2015   Procedure: Peripheral Vascular Atherectomy;  Surgeon: Runell Gess, MD;  Location: [facility] INVASIVE CV LAB;  Service: Cardiovascular;  Laterality: Right;   POLYPECTOMY  11/26/2020   Procedure: POLYPECTOMY;  Surgeon: Lanelle Bal, DO;  Location: [facility] ENDO [facility];  Service: Endoscopy;;   POLYPECTOMY  07/15/2022   Procedure: POLYPECTOMY INTESTINAL;  Surgeon: Marguerita Merles, Reuel Boom, MD;  Location: [facility] ENDO [facility];  Service: Gastroenterology;;   TUBAL LIGATION     VAGINAL HYSTERECTOMY       Inpatient Medications: Scheduled Meds:  feeding supplement  237 mL Oral BID BM   furosemide  80 mg Intravenous BID   lactulose  20 g Oral BID   levothyroxine  88 mcg Oral QAC breakfast   metoprolol succinate  12.5 mg Oral Daily   pantoprazole  40 mg Oral Daily   Continuous Infusions:  PRN Meds: acetaminophen **OR** acetaminophen, alum & mag hydroxide-simeth, HYDROcodone-acetaminophen, ondansetron **OR** ondansetron (ZOFRAN) IV  Allergies:    Allergies  Allergen Reactions   Morphine And Codeine Swelling and Other (See Comments)    Facial swelling   Latex Rash   Statins Other (See Comments)    Myalgias     Social History:    Social History   Socioeconomic History   Marital status: Divorced    Spouse name: Not on file   Number of children: 5   Years of education: Not on file   Highest education level: High school graduate  Occupational History   Occupation: retired  Tobacco Use   Smoking status: Never   Smokeless tobacco: Never  Vaping Use   Vaping status: Never Used  Substance and Sexual Activity   Alcohol use: Not Currently   Drug use: No   Sexual activity: Never  Other Topics Concern   Not on file  Social History Narrative   2 sons and their sons are living with her right now   Another son lives next door and daughter lives in [place]   Social Determinants of Health   Financial Resource Strain: Low Risk  (10/30/2022)   Overall Financial Resource Strain (CARDIA)    Difficulty of Paying Living Expenses: Not hard at all  Food Insecurity: No Food Insecurity (01/16/2023)   Hunger Vital Sign    Worried About Running Out of Food in the Last Year: Never true    Ran Out of Food in the Last Year: Never true  Transportation Needs: No Transportation Needs (01/16/2023)   PRAPARE - Administrator, Civil Service (Medical): No    Lack of Transportation (Non-Medical): No  Physical Activity: Inactive (09/25/2020)   Exercise Vital Sign    Days of Exercise per Week: 0 days    Minutes of Exercise per Session: 0 min  Stress: No Stress Concern Present (10/12/2022)   Received from [facility] of Occupational Health - Occupational Stress Questionnaire    Feeling of Stress : Only a little  Social Connections: Unknown (08/27/2022)   Received from [facility]   Social Network    Social Network: Not on file  Intimate Partner Violence: Not At Risk (01/16/2023)   Humiliation, Afraid, Rape, and Kick questionnaire    Fear of Current or Ex-Partner: No    Emotionally Abused: No    Physically Abused: No    Sexually Abused: No    Family History:   Family History  Problem  Relation Age of Onset   Heart attack Mother    Heart disease Mother    Arrhythmia Mother        has PPM   Heart disease Father    Diabetes Sister    Breast cancer Neg Hx      ROS:  Please see the history of present illness.   All other ROS reviewed and negative.     Physical Exam/Data:   Vitals:   01/18/23 0323 01/18/23 0915 01/18/23 1238 01/18/23 1323  BP: (!) 105/57 104/60 (!) 105/56 (!) 92/51  Pulse: (!) 113 (!) 109 (!) 105 (!) 104  Resp: 12 12  14   Temp: 98.6 F (37 C) 98 F (36.7 C)  97.6 F (36.4 C)  TempSrc: Oral Oral  Axillary  SpO2: 92% 95% 93% 96%  Weight: 57.8 kg     Height:        Intake/Output Summary (Last 24 hours) at 01/18/2023 1436 Last data filed at 01/18/2023 1200 Gross per 24 hour  Intake --  Output 220 ml  Net -220 ml      01/18/2023    3:23 AM 01/17/2023    3:51 AM 01/15/2023    3:46 PM  Last 3 Weights  Weight (lbs) 127 lb 6.8 oz 128 lb 12 oz 125 lb 6.4 oz  Weight (kg) 57.8 kg 58.4 kg 56.881 kg     Body mass index is 21.2 kg/m.  General:  Chronically ill, no acute distress Neck: JVD to mandible Cardiac:  Tachycardic, regular rhythm. Lungs: Normal work of breathing at rest.   Abd: soft, nontender Ext: 1+ edema Musculoskeletal:  No deformities, BUE and BLE strength normal and equal Skin: warm and dry   EKG:  The EKG was personally reviewed and demonstrates:  Sinus tachycardia Telemetry:  Telemetry was personally reviewed and demonstrates: Sinus tachycardia, occasional PVCs.  Laboratory Data:  High Sensitivity Troponin:   Recent Labs  Lab 01/15/23 1658 01/15/23 1917 01/16/23 0455 01/16/23 0803  TROPONINIHS 78* 69* 83* 89*     Chemistry Recent Labs  Lab 01/16/23 0455 01/17/23 0325 01/18/23 0423  NA 130* 133* 130*  K 3.4* 4.6 3.7  CL 94* 97* 95*  CO2 24 23 23   GLUCOSE 108* 137* 201*  BUN 29* 32* 38*  CREATININE 2.52* 2.55* 2.92*  CALCIUM 9.3 9.8 9.8  GFRNONAA 19* 19* 16*  ANIONGAP 12 13 12     Recent Labs  Lab  01/16/23 0455 01/17/23 0325 01/18/23 0423  PROT 6.2* 6.3* 6.9  ALBUMIN  2.5* 3.0* 3.6  AST 43* 62* 53*  ALT 16 17 17   ALKPHOS 40 36* 32*  BILITOT 7.7* 7.9* 8.0*   Lipids No results for input(s): "CHOL", "TRIG", "HDL", "LABVLDL", "LDLCALC", "CHOLHDL" in the last 168 hours.  Hematology Recent Labs  Lab 01/15/23 1552 01/16/23 0455 01/17/23 0325  WBC 8.9 8.2 9.7  RBC 3.21* 2.75* 2.63*  HGB 11.3* 9.7* 9.1*  HCT 31.3* 27.0* 25.8*  MCV 97.5 98.2 98.1  MCH 35.2* 35.3* 34.6*  MCHC 36.1* 35.9 35.3  RDW 18.6* 18.6* 19.2*  PLT 151 135* 135*   Thyroid No results for input(s): "TSH", "FREET4" in the last 168 hours.  BNP Recent Labs  Lab 01/15/23 1658  BNP 2,619.5*    DDimer No results for input(s): "DDIMER" in the last 168 hours.   Radiology/Studies:  IR Paracentesis  Result Date: 01/16/2023 INDICATION: 82 year old female. History of cirrhosis with portal hypertension, esophageal varices, CHF. Patient presents for therapeutic and diagnostic paracentesis EXAM: ULTRASOUND GUIDED THERAPEUTIC AND DIAGNOSTIC PARACENTESIS MEDICATIONS: Lidocaine 1% 10 mL COMPLICATIONS: None immediate. PROCEDURE: Informed written consent was obtained from the patient after a discussion of the risks, benefits and alternatives to treatment. A timeout was performed prior to the initiation of the procedure. Initial ultrasound scanning demonstrates a small amount of ascites within the right lower abdominal quadrant. The right lower abdomen was prepped and draped in the usual sterile fashion. 1% lidocaine was used for local anesthesia. Following this, a 19 gauge, 7-cm, Yueh catheter was introduced. An ultrasound image was saved for documentation purposes. The paracentesis was performed. The catheter was removed and a dressing was applied. The patient tolerated the procedure well without immediate post procedural complication. FINDINGS: A total of approximately 3 L of amber color fluid was removed. Samples were sent to  the laboratory as requested by the clinical team. IMPRESSION: Successful ultrasound-guided therapeutic and diagnostic paracentesis yielding 3 liters of peritoneal fluid. Performed by Anders Grant NP PLAN: If the patient eventually requires >/=2 paracenteses in a 30 day period, candidacy for formal evaluation by the [facility] will be assessed. Electronically Signed   By: Gilmer Mor D.O.   On: 01/16/2023 14:06   ECHOCARDIOGRAM LIMITED  Result Date: 01/16/2023    ECHOCARDIOGRAM LIMITED REPORT   Patient Name:   TAIAH VICKER Date of Exam: 01/16/2023 Medical Rec #:  [id number]      Height:       65.0 in Accession #:    [id number]     Weight:       125.4 lb Date of Birth:  05-Mar-1941      BSA:          1.622 m Patient Age:    82 years       BP:           114/54 mmHg Patient Gender: F              HR:           112 bpm. Exam Location:  Inpatient Procedure: Limited Echo, Color Doppler and Cardiac Doppler Indications:    acute systolic chf  History:        Patient has prior history of Echocardiogram examinations, most                 recent 11/01/2022. Cardiomyopathy and CHF, CAD, chronic kidney                 disease and PAD, Signs/Symptoms:Murmur; Risk  Factors:Hypertension, Dyslipidemia and Diabetes.  Sonographer:    Delcie Roch RDCS Referring Phys: [id number] SUBRINA SUNDIL IMPRESSIONS  1. LV function is severely depressed There is thickening of the basal lateral,base/mid anterior, basal inferior and base/mid septal walls. Akinetic elsewhere .Marland Kitchen Left ventricular ejection fraction, by estimation, is 20 to 25%. The left ventricle has severely decreased function. The left ventricle demonstrates global hypokinesis.  2. Right ventricular systolic function is normal. The right ventricular size is normal. There is moderately elevated pulmonary artery systolic pressure.  3. Moderate to severe mitral valve regurgitation.  4. The aortic valve is  tricuspid. Aortic valve regurgitation is trivial. Aortic valve sclerosis/calcification is present, without any evidence of aortic stenosis.  5. The inferior vena cava is dilated in size with <50% respiratory variability, suggesting right atrial pressure of 15 mmHg. FINDINGS  Left Ventricle: LV function is severely depressed There is thickening of the basal lateral,base/mid anterior, basal inferior and base/mid septal walls. Akinetic elsewhere. Left ventricular ejection fraction, by estimation, is 20 to 25%. The left ventricle has severely decreased function. The left ventricle demonstrates global hypokinesis. Right Ventricle: The right ventricular size is normal. Right vetricular wall thickness was not assessed. Right ventricular systolic function is normal. There is moderately elevated pulmonary artery systolic pressure. The tricuspid regurgitant velocity is  3.33 m/s, and with an assumed right atrial pressure of 8 mmHg, the estimated right ventricular systolic pressure is 52.4 mmHg. Left Atrium: Left atrial size was normal in size. Right Atrium: Right atrial size was normal in size. Pericardium: There is no evidence of pericardial effusion. Mitral Valve: Moderate to severe mitral valve regurgitation. Tricuspid Valve: The tricuspid valve is normal in structure. Tricuspid valve regurgitation is trivial. Aortic Valve: The aortic valve is tricuspid. Aortic valve regurgitation is trivial. Aortic valve sclerosis/calcification is present, without any evidence of aortic stenosis. Aorta: The aortic root and ascending aorta are structurally normal, with no evidence of dilitation. Venous: The inferior vena cava is dilated in size with less than 50% respiratory variability, suggesting right atrial pressure of 15 mmHg. Additional Comments: Spectral Doppler performed. Color Doppler performed.  LEFT VENTRICLE PLAX 2D LVIDd:         5.00 cm LVIDs:         4.70 cm LV PW:         0.90 cm LV IVS:        0.60 cm LVOT diam:     1.40  cm LV SV:         32 LV SV Index:   20 LVOT Area:     1.54 cm  LV Volumes (MOD) LV vol d, MOD A4C: 120.0 ml LV vol s, MOD A4C: 80.8 ml LV SV MOD A4C:     120.0 ml IVC IVC diam: 1.90 cm LEFT ATRIUM         Index LA diam:    3.20 cm 1.97 cm/m  AORTIC VALVE LVOT Vmax:   116.00 cm/s LVOT Vmean:  81.700 cm/s LVOT VTI:    0.208 m  AORTA Ao Asc diam: 2.90 cm MR Peak grad:    69.2 mmHg    TRICUSPID VALVE MR Mean grad:    39.0 mmHg    TR Peak grad:   44.4 mmHg MR Vmax:         416.00 cm/s  TR Vmax:        333.00 cm/s MR Vmean:        281.0 cm/s MR PISA:  1.57 cm     SHUNTS MR PISA Eff ROA: 15 mm       Systemic VTI:  0.21 m MR PISA Radius:  0.50 cm      Systemic Diam: 1.40 cm Dietrich Pates MD Electronically signed by Dietrich Pates MD Signature Date/Time: 01/16/2023/1:43:45 PM    Final    DG Chest 2 View  Result Date: 01/15/2023 CLINICAL DATA:  Shortness of breath after COVID vaccination. EXAM: CHEST - 2 VIEW COMPARISON:  Chest radiograph 11/12/2022. Lung bases from concurrent abdominopelvic CT. FINDINGS: Moderate right pleural effusion, increased from prior exam. There is a small left pleural effusion, improved from September exam. Heart is upper normal in size. Interstitial thickening may represent edema or be chronic, and is similar to prior exam. No pneumothorax. IMPRESSION: 1. Moderate right pleural effusion, increased from prior exam. Small left pleural effusion, improved from September exam. 2. Interstitial thickening may represent edema or be chronic. Electronically Signed   By: Narda Rutherford M.D.   On: 01/15/2023 19:51   CT ABDOMEN PELVIS WO CONTRAST  Result Date: 01/15/2023 CLINICAL DATA:  Acute abdominal pain.  Diarrhea. EXAM: CT ABDOMEN AND PELVIS WITHOUT CONTRAST TECHNIQUE: Multidetector CT imaging of the abdomen and pelvis was performed following the standard protocol without IV contrast. RADIATION DOSE REDUCTION: This exam was performed according to the departmental dose-optimization program which  includes automated exposure control, adjustment of the mA and/or kV according to patient size and/or use of iterative reconstruction technique. COMPARISON:  None Available. FINDINGS: Lower chest: Moderate right and small left pleural effusion. Associated compressive atelectasis in the right lower, right middle, and left lower lobes. The heart is upper normal in size, there is left ventricular dilatation. Hepatobiliary: Nodular hepatic contours typical of cirrhosis. No evidence of focal liver abnormality on this unenhanced exam. Small gallstones within the gallbladder. No biliary dilatation. Pancreas: No ductal dilatation or inflammation. Spleen: No splenomegaly. Adrenals/Urinary Tract: No adrenal nodule. Mild bilateral renal parenchymal atrophy. No hydronephrosis. No renal calculi. Urinary bladder is minimally distended, grossly normal for degree of distension. Stomach/Bowel: Detailed bowel assessment is limited due to the presence of ascites and lack of contrast. Equivocal gastric wall thickening. Equivocal duodenal wall thickening. There are duodenal diverticula. No small bowel distension. Normal appendix. Multifocal colonic diverticulosis without evidence of focal diverticulitis. There is colonic wall thickening from the transverse colon through the distal descending colon. Small volume of formed colonic stool. Vascular/Lymphatic: Aortic and branch atherosclerosis. Retroaortic left renal vein. Suspected recannulated umbilical vein, not well assessed in the absence of IV contrast. There is no obvious bulky abdominopelvic adenopathy, although ascites limits detailed assessment. Reproductive: The uterus is not seen, presumed hysterectomy. No evidence of adnexal mass. Other: Moderate to large volume abdominopelvic ascites. Ascitic fluid is simple in density. Generalized edema of the subcutaneous and intra-abdominal fat. No free air. Musculoskeletal: Mild scoliosis and degenerative change in the spine. There are no  acute or suspicious osseous abnormalities. IMPRESSION: 1. 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 2. Colonic wall thickening from the transverse colon through the distal descending colon, suspicious for colitis. This may be infectious or inflammatory. Distribution is not typical for portal colopathy. 3. Colonic diverticulosis without evidence of focal diverticulitis. 4. Cholelithiasis. 5. Moderate right and small left pleural effusions with associated compressive atelectasis. Aortic Atherosclerosis (ICD10-I70.0). Electronically Signed   By: Narda Rutherford M.D.   On: 01/15/2023 19:50     Assessment and Plan:   ONIESHA LASKO is a 82 y.o. female  with a history of systolic heart failure (LVEF 30-35%), severe MR, CKD 4, liver cirrhosis, portal hypertension, esophageal varices, presumed CAD, chronic respiratory failure on 2 L O2, type 2 diabetes mellitus, history of chronic blood loss anemia and GAVE's disease, history of CVA, hypothyroidism with multiple hospitalizations who is currnetly admitted for shortness of breath, cough, lower abdominal pain, diarrhea.  She was found to be volume overloaded on presentation.  She has been undergoing diuresis.  It is difficult to discern how much of her volume overload is secondary to her heart failure versus her end-stage cirrhosis.  Her kidney function is declining despite diuresis which would suggest presence of hepatorenal syndrome.  Acute on chronic systolic CHF Severe mitral regurgitation Decompensated liver cirrhosis, ascites History of esophageal varices Mild hepatic encephalopathy AKI on CKD 4 Hyponatremia  Plan:  -Recommend consulting with hepatology and/or nephrology for management of possible hepatorenal syndrome - octreotide, etc.  Attempt to continue with diuresis. -Her baseline hypotension from cirrhosis and renal dysfunction does not allow for HF GDMT other than metoprolol. -She is not a candidate for advanced  heart failure therapies due to her age and comorbidities. -Agree with palliative care consult and continuing to address overall goals of care.  For questions or updates, please contact [facility] Please consult [email or web address] for contact info under    Signed, Nobie Putnam, MD  01/18/2023 2:36 PM

## 2023-01-19 ENCOUNTER — Ambulatory Visit: Payer: Medicare HMO

## 2023-01-19 DIAGNOSIS — I5023 Acute on chronic systolic (congestive) heart failure: Secondary | ICD-10-CM | POA: Diagnosis not present

## 2023-01-19 LAB — ANTI-SMOOTH MUSCLE ANTIBODY, IGG: F-Actin IgG: 5 U (ref 0–19)

## 2023-01-19 LAB — BASIC METABOLIC PANEL
Anion gap: 11 (ref 5–15)
BUN: 46 mg/dL — ABNORMAL HIGH (ref 8–23)
CO2: 22 mmol/L (ref 22–32)
Calcium: 9.7 mg/dL (ref 8.9–10.3)
Chloride: 99 mmol/L (ref 98–111)
Creatinine, Ser: 3.85 mg/dL — ABNORMAL HIGH (ref 0.44–1.00)
GFR, Estimated: 11 mL/min — ABNORMAL LOW (ref >60)
Glucose, Bld: 150 mg/dL — ABNORMAL HIGH (ref 70–99)
Potassium: 3.8 mmol/L (ref 3.5–5.1)
Sodium: 132 mmol/L — ABNORMAL LOW (ref 135–145)

## 2023-01-19 LAB — CYTOLOGY - NON PAP

## 2023-01-19 MED ORDER — FUROSEMIDE 10 MG/ML IJ SOLN
120.0000 mg | Freq: Two times a day (BID) | INTRAVENOUS | Status: DC
  Administered 2023-01-19 – 2023-01-20 (×4): 120 mg via INTRAVENOUS
  Filled 2023-01-19 (×2): qty 12
  Filled 2023-01-19 (×3): qty 10
  Filled 2023-01-19: qty 12

## 2023-01-19 MED ORDER — LACTULOSE 10 GM/15ML PO SOLN
10.0000 g | Freq: Every day | ORAL | Status: DC
  Administered 2023-01-20: 10 g via ORAL
  Filled 2023-01-19: qty 15

## 2023-01-19 MED ORDER — SODIUM CHLORIDE 0.9 % IV SOLN
25.0000 ug/h | INTRAVENOUS | Status: DC
  Administered 2023-01-19 – 2023-01-20 (×2): 25 ug/h via INTRAVENOUS
  Filled 2023-01-19 (×2): qty 1

## 2023-01-19 NOTE — Progress Notes (Signed)
Physical Therapy Treatment Patient Details Name: Jessica Torres MRN: [id number] DOB: 09/09/1940 Today's Date: 01/19/2023   History of Present Illness Jessica Torres is a 82 y.o. female admitted 11/7 with decompensated cirrhosis. Underwent paracentesis 11/8. PMH: chronic systolic heart failure with reduced ejection fraction (30 to 35%), CAD, CKD stage V, hepatic cirrhosis with history of esophageal varices, chronic hypoxic respiratory failure on 2L oxygen at baseline, chronic anemia and chronic thrombocytopenia    PT Comments  Pt asleep on entry, wakes easily and reports she would like to get up to recliner. Pt limited in safe mobility today by incontinence of stool in standing and generalized weakness, and associated decreased balance and endurance. With coming to EoB pt asks to use BSC. With modAx2 pt comes to standing and starts stepping to [facility] but becomes incontinent of stool. Pt able to stand from [facility] with minA for face to face power up and stepping to recliner on R. Pt request to be put as close to window as possible so she can sit in the sun. Family present at end of session. D/c plans are appropriate. PT will follow acutely as appropriate.    If plan is discharge home, recommend the following: A little help with walking and/or transfers;A little help with bathing/dressing/bathroom;Assistance with cooking/housework;Assist for transportation;Help with stairs or ramp for entrance   Can travel by private vehicle        Equipment Recommendations  BSC/3in1    Recommendations for Other Services       Precautions / Restrictions Precautions Precautions: Fall Precaution Comments: 2LO2 prn Restrictions Weight Bearing Restrictions: No     Mobility  Bed Mobility Overal bed mobility: Needs Assistance Bed Mobility: Supine to Sit     Supine to sit: Min assist, +2 for physical assistance     General bed mobility comments: minAx2 for coming to seated EoB,once there asks to get to [facility],     Transfers Overall transfer level: Needs assistance Equipment used: Rolling walker (2 wheels) Transfers: Sit to/from Stand, Bed to chair/wheelchair/BSC Sit to Stand: Mod assist, +2 physical assistance   Step pivot transfers: Min assist, +2 physical assistance       General transfer comment: initial stand from EoB requires modAx2 for power up and steadying vc for sequencing and hand placement, minA for stepping with RW to [facility], pt incontinent of stool during transfer, performed min A face to face transfer to come to standing and step to recliner on her R,       Balance Overall balance assessment: Needs assistance Sitting-balance support: Feet supported, No upper extremity supported, Bilateral upper extremity supported Sitting balance-Leahy Scale: Fair     Standing balance support: Bilateral upper extremity supported, During functional activity, Reliant on assistive device for balance Standing balance-Leahy Scale: Poor                              Cognition Arousal: Alert Behavior During Therapy: Flat affect Overall Cognitive Status: Within Functional Limits for tasks assessed                                 General Comments: asleep on entry but wakes easily           General Comments General comments (skin integrity, edema, etc.): VSS on 3L O2 via [place]      Pertinent Vitals/Pain Pain Assessment Pain Assessment: Faces Faces Pain  Scale: Hurts a little bit Pain Location: generalized Pain Descriptors / Indicators: Discomfort, Grimacing, Guarding Pain Intervention(s): Limited activity within patient's tolerance, Monitored during session, Repositioned     PT Goals (current goals can now be found in the care plan section) Acute Rehab PT Goals Patient Stated Goal: to go home PT Goal Formulation: With patient Time For Goal Achievement: 01/30/23 Potential to Achieve Goals: Good Progress towards PT goals: Progressing toward goals     Frequency    Min 1X/week       AM-PAC PT "6 Clicks" Mobility   Outcome Measure  Help needed turning from your back to your side while in a flat bed without using bedrails?: A Lot Help needed moving from lying on your back to sitting on the side of a flat bed without using bedrails?: A Lot Help needed moving to and from a bed to a chair (including a wheelchair)?: Total Help needed standing up from a chair using your arms (e.g., wheelchair or bedside chair)?: Total Help needed to walk in hospital room?: Total Help needed climbing 3-5 steps with a railing? : Total 6 Click Score: 8    End of Session Equipment Utilized During Treatment: Oxygen Activity Tolerance: Patient limited by fatigue Patient left: with call bell/phone within reach;in chair;with chair alarm set Nurse Communication: Mobility status PT Visit Diagnosis: Muscle weakness (generalized) (M62.81)     Time: 7425-9563 PT Time Calculation (min) (ACUTE ONLY): 29 min  Charges:    $Therapeutic Activity: 38-52 mins PT General Charges $$ ACUTE PT VISIT: 1 Visit                     Svara Twyman B. Beverely Risen PT, DPT Acute Rehabilitation Services Please use secure chat or  Call Office [phone]    Elon Alas Fleet 01/19/2023, 1:09 PM

## 2023-01-19 NOTE — Progress Notes (Signed)
Mobility Specialist Progress Note:   01/19/23 1500  Mobility  Activity Transferred from chair to bed  Level of Assistance +2 (takes two people)  Press photographer wheel walker  Distance Ambulated (ft) 4 ft  Activity Response Tolerated well  $Mobility charge 1 Mobility  Mobility Specialist Start Time (ACUTE ONLY) 1426  Mobility Specialist Stop Time (ACUTE ONLY) 1435  Mobility Specialist Time Calculation (min) (ACUTE ONLY) 9 min    Pt received in chair, needing to get back to bed per RN request. Pt found to have had BM in chair. Able to stand w/ minA while RN performed pericare. Asymptomatic w/ no complaints throughout. Pt left in bed with call bell and RN present.  D'Vante Earlene Plater Mobility Specialist Please contact via Special educational needs teacher or Rehab office at [phone]

## 2023-01-19 NOTE — TOC Initial Note (Addendum)
Transition of Care [facility]) - Initial/Assessment Note    Patient Details  Name: Jessica Torres MRN: [id number] Date of Birth: 1940-10-27  Transition of [facility] CM/SW Contact:    Delilah Shan, LCSWA Phone Number: 01/19/2023, 10:40 AM  Clinical Narrative:                  TOC received consult for referral to hospice facility for patient. CSW spoke with patients son Thayer Ohm. Thayer Ohm gave CSW permission to make hospice referral to [facility] facility. CSW spoke with [place] with [facility] and made referral. [place] informed CSW will give CSW a call back today to let CSW know if RN can come out today or tomorrow to see patient. CSW will continue to follow and assist with patients dc planning needs.  Update- 11:45am- CSW received call back from [place] with [facility] who informed CSW that an RN would be by today to assess patient between 1:30 and 2:00pm. CSW informed MD and RN.       Patient Goals and CMS Choice            Expected Discharge Plan and Services                                              Prior Living Arrangements/Services                       Activities of Daily Living   ADL Screening (condition at time of admission) Independently performs ADLs?: Yes (appropriate for developmental age) Is the patient deaf or have difficulty hearing?: No Does the patient have difficulty seeing, even when wearing glasses/contacts?: No Does the patient have difficulty concentrating, remembering, or making decisions?: No  Permission Sought/Granted                  Emotional Assessment              Admission diagnosis:  Pleural effusion [J90] Other ascites [R18.8] Decompensated heart failure (HCC) [I50.9] Acute exacerbation of CHF (congestive heart failure) (HCC) [I50.9] Patient Active Problem List   Diagnosis Date Noted   Ascites 01/16/2023   Colitis 01/16/2023   Hyperbilirubinemia 01/16/2023    Transaminitis 01/16/2023   Chronic hypoxic respiratory failure (HCC) 01/16/2023   Gout 01/16/2023   Elevated troponin 01/16/2023   Ischemic cardiomyopathy 01/16/2023   Chronic idiopathic thrombocytopenia (HCC) 01/16/2023   Acute exacerbation of CHF (congestive heart failure) (HCC) 01/15/2023   Acute on chronic systolic CHF (congestive heart failure) (HCC) 11/12/2022   Palliative care encounter 11/01/2022   Bilateral pleural effusion 10/31/2022   Hyponatremia 10/30/2022   Acute on chronic combined systolic and diastolic CHF (congestive heart failure) (HCC) 10/29/2022   AKI (acute kidney injury) (HCC) 10/29/2022   Normocytic anemia 07/15/2022   Melena 07/15/2022   Decompensated cirrhosis (HCC) 07/13/2022   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   Gastritis and gastroduodenitis 04/28/2022   NSTEMI (non-ST elevated myocardial infarction) (HCC) 04/27/2022   Gastrointestinal hemorrhage 04/27/2022   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 disease 04/27/2022   Heart failure with preserved ejection fraction (HCC) 04/27/2022   Chronic cough 04/27/2022   History of CVA (cerebrovascular accident) 04/27/2022   Osteopenia 11/28/2020   Iron deficiency 10/11/2020   CKD (chronic kidney disease), stage IV (HCC) 05/25/2020   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   Chronic bilateral low  back pain without sciatica 07/13/2019   Carotid stenosis, right 12/10/2017   Occlusion and stenosis of right carotid artery 12/10/2017   Left hip pain 06/04/2017   Claudication in peripheral vascular disease (HCC) 10/30/2016   Vitamin D deficiency 05/07/2016   Gastroesophageal reflux disease without esophagitis 01/28/2016   Coronary artery disease due to lipid rich plaque 10/18/2015   Hypothyroidism 10/18/2015   S/P -Billat SFA PTA with restenosis  07/06/2014   Peripheral vascular angioplasty status 07/06/2014   Hypertension associated with diabetes (HCC) 06/28/2014   Hyperlipidemia-statin intolerant 06/28/2014    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 [place] [address] Phone: [phone] Fax: [phone]  [facility] [address] [place] [address] Phone: [phone] Fax: [phone]  [facility] [address] [place] [address] Phone: [phone] Fax: [phone]     Social Determinants of Health (SDOH) Social History: SDOH Screenings   Food Insecurity: No Food Insecurity (01/16/2023)  Housing: Low Risk  (01/16/2023)  Transportation Needs: No Transportation Needs (01/16/2023)  Utilities: Not At Risk (01/16/2023)  Alcohol Screen: Low Risk  (10/30/2022)  Depression (PHQ2-9): Medium Risk (02/21/2022)  Financial Resource Strain: Low Risk  (10/30/2022)  Physical Activity: Inactive (09/25/2020)  Social Connections: Unknown (08/27/2022)   Received from [facility]  Stress: No Stress Concern Present (10/12/2022)   Received from [facility]  Tobacco Use: Low Risk  (01/16/2023)   SDOH Interventions:     Readmission Risk Interventions    10/31/2022    5:12 PM  Readmission Risk Prevention Plan  Transportation Screening Complete  HRI or Home Care Consult Complete  Palliative Care Screening Not Applicable  Medication Review (RN Care Manager) Complete

## 2023-01-19 NOTE — Progress Notes (Signed)
Bladder scan read 413 mL. Notified Dr. Lazarus Salines as to what he wants done. He indicated to notify the day MD to see if they want Foley inserted for patient to take to Hospice. Will pass this on to incoming RN.

## 2023-01-19 NOTE — Care Management Important Message (Signed)
Important Message  Patient Details  Name: Jessica Torres MRN: [id number] Date of Birth: 04-Nov-1940   Important Message Given:  Yes - Medicare IM     Sherilyn Banker 01/19/2023, 3:09 PM

## 2023-01-19 NOTE — Plan of Care (Signed)
  Problem: Education: Goal: Knowledge of General Education information will improve Description: Including pain rating scale, medication(s)/side effects and non-pharmacologic comfort measures Outcome: Progressing   Problem: Clinical Measurements: Goal: Ability to maintain clinical measurements within normal limits will improve Outcome: Not Progressing Goal: Respiratory complications will improve Outcome: Not Progressing   Problem: Activity: Goal: Risk for activity intolerance will decrease Outcome: Not Progressing   Problem: Coping: Goal: Level of anxiety will decrease Outcome: Not Progressing   Problem: Elimination: Goal: Will not experience complications related to bowel motility Outcome: Not Progressing   Problem: Pain Management: Goal: General experience of comfort will improve Outcome: Not Progressing

## 2023-01-19 NOTE — Progress Notes (Addendum)
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 -In the ED she was tachycardic, blood pressure 150/62, lactate 1.9, labs with sodium 131, creatinine 2.5, albumin 3.2, hemoglobin 11.3, WBC 8.9, troponin 78, 69, bili 3.9, BNP 2619, chest x-ray with moderate right pleural effusion and interstitial thickening?  Edema CT abdomen pelvis with cirrhosis, moderate to large volume ascites, colon wall thickening?  Colitis, moderate right and small left effusions -11/8, paracentesis 3 L of amber fluid drained -11/9, palliative care meeting, continue DNR/DNI -GI recommended palliative care and signed off -Cardiology following, palliative care following as well, kidney function continues to worsen, poor response to diuretics -Palliative meeting 11/10, family agrees to residential hospice  Subjective: -Feels so-so, no events overnight, dyspnea with any activity  Assessment and Plan:  Acute on chronic systolic CHF Severe mitral regurgitation, suspected CAD -Worsened in the background of decompensated cirrhosis and CKD 4 -Seventh hospitalization this year with volume overload, prognosis remains poor -Poor response to diuretics, creatinine continues to worsen, on high-dose IV Lasix, metoprolol -Very poor prognosis with significant chronic disease burden, called and updated son Thayer Ohm, palliative consulted, family agreeable to hospice placement -GDMT limited by CKD 4, she is not a  candidate for advanced therapies -Palliative care team following  Decompensated liver cirrhosis, ascites History of esophageal varices Mild hepatic encephalopathy -Ultrasound paracentesis completed - 3 L drained, fluid not suggestive of SBP -Discontinued antibiotics, diuretics as noted above -Ammonia level is elevated, started on lactulose  AKI on CKD 4 Hepatorenal syndrome Hyponatremia -Baseline creatinine is around 2 -Now 2.9, likely cardiorenal,, component of hepatorenal syndrome as well, given multiple doses of IV albumin as well this admission -Remains volume overloaded, diuretics as above, avoid hypotension  Colon wall thickening involving transverse and distal colon -Suspect this is related to ascites, low albumin -Ascitic fluid not suggestive of SBP will discontinue antibiotics and monitor  Chronic anemia, thrombocytopenia Relatively stable monitor  Type 2 diabetes mellitus - HbA1c was 5.4 in September, on glargine at baseline, SSI for now  Chronic hypoxic respiratory failure on 2 L home O2 at baseline, stable  Hypothyroidism  -continue Synthroid  DVT prophylaxis: SCDs Code Status: DNR  Family Communication: No family at bedside, called and updated son Thayer Ohm Disposition Plan: Anticipate hospice facility  Consultants:    Procedures:   Antimicrobials:    Objective: Vitals:   01/18/23 2300 01/19/23 0437 01/19/23 0831 01/19/23 1158  BP:  (!) 95/52 110/64 (!) 96/58  Pulse: 97  (!) 102 (!) 104  Resp:   18 19  Temp:  98.1 F (36.7 C) 97.8 F (36.6 C) 98 F (36.7 C)  TempSrc:  Oral Oral Oral  SpO2: 97% 95% 93% 99%  Weight:      Height:        Intake/Output Summary (Last 24 hours) at 01/19/2023 1205 Last data filed at 01/19/2023 0959 Gross per 24 hour  Intake 100 ml  Output 0 ml  Net 100 ml   Filed Weights   01/15/23 1546 01/17/23 0351 01/18/23 0323  Weight: 56.9  kg 58.4 kg 57.8 kg    Examination:  General exam: Chronically ill debilitated  cachectic female laying in bed, AAOx3 HEENT: Positive JVD CVS: S1-S2, regular rhythm Lungs: Decreased breath sounds at the bases Abdomen: Soft, nontender, less distended, bowel sounds present Extremities: Positive edema  Psychiatry: Flat affect    Data Reviewed:   CBC: Recent Labs  Lab 01/15/23 1552 01/16/23 0455 01/17/23 0325  WBC 8.9 8.2 9.7  NEUTROABS 6.4  --   --   HGB 11.3* 9.7* 9.1*  HCT 31.3* 27.0* 25.8*  MCV 97.5 98.2 98.1  PLT 151 135* 135*   Basic Metabolic Panel: Recent Labs  Lab 01/15/23 1552 01/16/23 0455 01/17/23 0325 01/18/23 0423 01/19/23 0558  NA 131* 130* 133* 130* 132*  K 4.2 3.4* 4.6 3.7 3.8  CL 94* 94* 97* 95* 99  CO2 26 24 23 23 22   GLUCOSE 171* 108* 137* 201* 150*  BUN 31* 29* 32* 38* 46*  CREATININE 2.52* 2.52* 2.55* 2.92* 3.85*  CALCIUM 10.2 9.3 9.8 9.8 9.7   GFR: Estimated Creatinine Clearance: 10.5 mL/min (A) (by C-G formula based on SCr of 3.85 mg/dL (H)). Liver Function Tests: Recent Labs  Lab 01/15/23 1552 01/16/23 0455 01/17/23 0325 01/18/23 0423  AST 46* 43* 62* 53*  ALT 13 16 17 17   ALKPHOS 46 40 36* 32*  BILITOT 8.7* 7.7* 7.9* 8.0*  PROT 7.1 6.2* 6.3* 6.9  ALBUMIN 3.2* 2.5* 3.0* 3.6   Recent Labs  Lab 01/15/23 1552  LIPASE 47   Recent Labs  Lab 01/16/23 0803  AMMONIA 81*   Coagulation Profile: Recent Labs  Lab 01/16/23 0455  INR 1.6*   Cardiac Enzymes: No results for input(s): "CKTOTAL", "CKMB", "CKMBINDEX", "TROPONINI" in the last 168 hours. BNP (last 3 results) No results for input(s): "PROBNP" in the last 8760 hours. HbA1C: No results for input(s): "HGBA1C" in the last 72 hours. CBG: Recent Labs  Lab 01/16/23 0725 01/16/23 1138 01/16/23 1616 01/17/23 0731 01/17/23 1156  GLUCAP 97 97 161* 126* 199*   Lipid Profile: No results for input(s): "CHOL", "HDL", "LDLCALC", "TRIG", "CHOLHDL", "LDLDIRECT" in the last 72 hours. Thyroid Function Tests: No results for input(s): "TSH", "T4TOTAL",  "FREET4", "T3FREE", "THYROIDAB" in the last 72 hours. Anemia Panel: No results for input(s): "VITAMINB12", "FOLATE", "FERRITIN", "TIBC", "IRON", "RETICCTPCT" in the last 72 hours.  Urine analysis:    Component Value Date/Time   COLORURINE YELLOW 01/16/2023 0506   APPEARANCEUR CLEAR 01/16/2023 0506   APPEARANCEUR Clear 10/28/2019 0900   LABSPEC 1.008 01/16/2023 0506   PHURINE 5.0 01/16/2023 0506   GLUCOSEU NEGATIVE 01/16/2023 0506   HGBUR NEGATIVE 01/16/2023 0506   BILIRUBINUR NEGATIVE 01/16/2023 0506   BILIRUBINUR Negative 10/28/2019 0900   KETONESUR NEGATIVE 01/16/2023 0506   PROTEINUR NEGATIVE 01/16/2023 0506   NITRITE NEGATIVE 01/16/2023 0506   LEUKOCYTESUR NEGATIVE 01/16/2023 0506   Sepsis Labs: @LABRCNTIP (procalcitonin:4,lacticidven:4)  ) Recent Results (from the past 240 hour(s))  Resp panel by RT-PCR (RSV, Flu A&B, Covid) Anterior Nasal Swab     Status: None   Collection Time: 01/15/23  9:53 PM   Specimen: Anterior Nasal Swab  Result Value Ref Range Status   SARS Coronavirus 2 by RT PCR NEGATIVE NEGATIVE Final    Comment: (NOTE) SARS-CoV-2 target nucleic acids are NOT DETECTED.  The SARS-CoV-2 RNA is generally detectable in upper respiratory specimens during the acute phase of infection. The lowest concentration of SARS-CoV-2 viral copies this assay can detect is 138 copies/mL. A negative result does not preclude  SARS-Cov-2 infection and should not be used as the sole basis for treatment or other patient management decisions. A negative result may occur with  improper specimen collection/handling, submission of specimen other than nasopharyngeal swab, presence of viral mutation(s) within the areas targeted by this assay, and inadequate number of viral copies(<138 copies/mL). A negative result must be combined with clinical observations, patient history, and epidemiological information. The expected result is Negative.  Fact Sheet for Patients:   BloggerCourse.com  Fact Sheet for Healthcare Providers:  SeriousBroker.it  This test is no t yet approved or cleared by the [place] FDA and  has been authorized for detection and/or diagnosis of SARS-CoV-2 by FDA under an Emergency Use Authorization (EUA). This EUA will remain  in effect (meaning this test can be used) for the duration of the COVID-19 declaration under Section 564(b)(1) of the Act, 21 U.S.C.section 360bbb-3(b)(1), unless the authorization is terminated  or revoked sooner.       Influenza A by PCR NEGATIVE NEGATIVE Final   Influenza B by PCR NEGATIVE NEGATIVE Final    Comment: (NOTE) The Xpert Xpress SARS-CoV-2/FLU/RSV plus assay is intended as an aid in the diagnosis of influenza from Nasopharyngeal swab specimens and should not be used as a sole basis for treatment. Nasal washings and aspirates are unacceptable for Xpert Xpress SARS-CoV-2/FLU/RSV testing.  Fact Sheet for Patients: BloggerCourse.com  Fact Sheet for Healthcare Providers: SeriousBroker.it  This test is not yet approved or cleared by the [place] FDA and has been authorized for detection and/or diagnosis of SARS-CoV-2 by FDA under an Emergency Use Authorization (EUA). This EUA will remain in effect (meaning this test can be used) for the duration of the COVID-19 declaration under Section 564(b)(1) of the Act, 21 U.S.C. section 360bbb-3(b)(1), unless the authorization is terminated or revoked.     Resp Syncytial Virus by PCR NEGATIVE NEGATIVE Final    Comment: (NOTE) Fact Sheet for Patients: BloggerCourse.com  Fact Sheet for Healthcare Providers: SeriousBroker.it  This test is not yet approved or cleared by the [place] FDA and has been authorized for detection and/or diagnosis of SARS-CoV-2 by FDA under an Emergency Use  Authorization (EUA). This EUA will remain in effect (meaning this test can be used) for the duration of the COVID-19 declaration under Section 564(b)(1) of the Act, 21 U.S.C. section 360bbb-3(b)(1), unless the authorization is terminated or revoked.  Performed at [facility], [address], [place], [place] [address]   C Difficile Quick Screen w PCR reflex     Status: None   Collection Time: 01/16/23  3:17 AM   Specimen: STOOL  Result Value Ref Range Status   C Diff antigen NEGATIVE NEGATIVE Final   C Diff toxin NEGATIVE NEGATIVE Final   C Diff interpretation No C. difficile detected.  Final    Comment: Performed at [facility], [address], [place], [place] [address]  Gastrointestinal Panel by PCR , Stool     Status: None   Collection Time: 01/16/23  3:17 AM   Specimen: Stool  Result Value Ref Range Status   Campylobacter species NOT DETECTED NOT DETECTED Final   Plesimonas shigelloides NOT DETECTED NOT DETECTED Final   Salmonella species NOT DETECTED NOT DETECTED Final   Yersinia enterocolitica NOT DETECTED NOT DETECTED Final   Vibrio species NOT DETECTED NOT DETECTED Final   Vibrio cholerae NOT DETECTED NOT DETECTED Final   Enteroaggregative E coli (EAEC) NOT DETECTED NOT DETECTED Final   Enteropathogenic E coli (EPEC)  NOT DETECTED NOT DETECTED Final   Enterotoxigenic E coli (ETEC) NOT DETECTED NOT DETECTED Final   Shiga like toxin producing E coli (STEC) NOT DETECTED NOT DETECTED Final   Shigella/Enteroinvasive E coli (EIEC) NOT DETECTED NOT DETECTED Final   Cryptosporidium NOT DETECTED NOT DETECTED Final   Cyclospora cayetanensis NOT DETECTED NOT DETECTED Final   Entamoeba histolytica NOT DETECTED NOT DETECTED Final   Giardia lamblia NOT DETECTED NOT DETECTED Final   Adenovirus F40/41 NOT DETECTED NOT DETECTED Final   Astrovirus NOT DETECTED NOT DETECTED Final   Norovirus GI/GII NOT DETECTED NOT DETECTED Final   Rotavirus A NOT DETECTED NOT  DETECTED Final   Sapovirus (I, II, IV, and V) NOT DETECTED NOT DETECTED Final    Comment: Performed at [facility], [address] [place], [place] [address]  Culture, blood (Routine X 2) w Reflex to ID Panel     Status: None (Preliminary result)   Collection Time: 01/16/23  4:55 AM   Specimen: BLOOD LEFT HAND  Result Value Ref Range Status   Specimen Description BLOOD LEFT HAND  Final   Special Requests   Final    BOTTLES DRAWN AEROBIC AND ANAEROBIC Blood Culture adequate volume   Culture   Final    NO GROWTH 3 DAYS Performed at [facility], [address] [place], [place] [address]    Report Status PENDING  Incomplete  Culture, blood (Routine X 2) w Reflex to ID Panel     Status: None (Preliminary result)   Collection Time: 01/16/23  4:55 AM   Specimen: BLOOD  Result Value Ref Range Status   Specimen Description BLOOD LEFT ANTECUBITAL  Final   Special Requests   Final    BOTTLES DRAWN AEROBIC AND ANAEROBIC Blood Culture adequate volume   Culture   Final    NO GROWTH 3 DAYS Performed at [facility], [address], [place], [place] [address]    Report Status PENDING  Incomplete  Culture, body fluid w Gram Stain-bottle     Status: None (Preliminary result)   Collection Time: 01/16/23 11:29 AM   Specimen: Peritoneal Washings  Result Value Ref Range Status   Specimen Description PERITONEAL ABDOMEN  Final   Special Requests NONE  Final   Culture   Final    NO GROWTH 3 DAYS Performed at [facility], [address], [place], [place] [address]    Report Status PENDING  Incomplete  Gram stain     Status: None   Collection Time: 01/16/23 11:29 AM   Specimen: Peritoneal Washings  Result Value Ref Range Status   Specimen Description PERITONEAL ABDOMEN  Final   Special Requests NONE  Final   Gram Stain   Final    WBC PRESENT, PREDOMINANTLY MONONUCLEAR NO ORGANISMS SEEN CYTOSPIN SMEAR Performed at [facility], [address],  [place], [place] [address]    Report Status 01/16/2023 FINAL  Final     Radiology Studies: No results found.   Scheduled Meds:  feeding supplement  237 mL Oral BID BM   lactulose  20 g Oral BID   levothyroxine  88 mcg Oral QAC breakfast   metoprolol succinate  12.5 mg Oral Daily   pantoprazole  40 mg Oral Daily   Continuous Infusions:  furosemide 120 mg (01/19/23 0959)      LOS: 3 days    Time spent:    Zannie Cove, MD [facility]   01/19/2023, 12:05 PM

## 2023-01-20 DIAGNOSIS — Z789 Other specified health status: Secondary | ICD-10-CM

## 2023-01-20 DIAGNOSIS — R188 Other ascites: Secondary | ICD-10-CM | POA: Diagnosis not present

## 2023-01-20 DIAGNOSIS — Z66 Do not resuscitate: Secondary | ICD-10-CM

## 2023-01-20 DIAGNOSIS — I5023 Acute on chronic systolic (congestive) heart failure: Secondary | ICD-10-CM | POA: Diagnosis not present

## 2023-01-20 DIAGNOSIS — I509 Heart failure, unspecified: Secondary | ICD-10-CM | POA: Diagnosis not present

## 2023-01-20 DIAGNOSIS — J90 Pleural effusion, not elsewhere classified: Secondary | ICD-10-CM | POA: Diagnosis not present

## 2023-01-20 DIAGNOSIS — Z515 Encounter for palliative care: Secondary | ICD-10-CM | POA: Diagnosis not present

## 2023-01-20 LAB — BASIC METABOLIC PANEL
Anion gap: 12 (ref 5–15)
BUN: 55 mg/dL — ABNORMAL HIGH (ref 8–23)
CO2: 22 mmol/L (ref 22–32)
Calcium: 9.5 mg/dL (ref 8.9–10.3)
Chloride: 93 mmol/L — ABNORMAL LOW (ref 98–111)
Creatinine, Ser: 4.4 mg/dL — ABNORMAL HIGH (ref 0.44–1.00)
GFR, Estimated: 10 mL/min — ABNORMAL LOW (ref >60)
Glucose, Bld: 161 mg/dL — ABNORMAL HIGH (ref 70–99)
Potassium: 4 mmol/L (ref 3.5–5.1)
Sodium: 127 mmol/L — ABNORMAL LOW (ref 135–145)

## 2023-01-20 MED ORDER — LORAZEPAM 1 MG PO TABS: 1.0000 mg | ORAL_TABLET | ORAL | Status: DC | PRN

## 2023-01-20 MED ORDER — POLYVINYL ALCOHOL 1.4 % OP SOLN: 1.0000 [drp] | Freq: Four times a day (QID) | OPHTHALMIC | Status: DC | PRN

## 2023-01-20 MED ORDER — DIPHENHYDRAMINE HCL 50 MG/ML IJ SOLN
25.0000 mg | INTRAMUSCULAR | Status: DC | PRN
  Administered 2023-01-21: 25 mg via INTRAVENOUS
  Filled 2023-01-20: qty 1

## 2023-01-20 MED ORDER — HYDROMORPHONE HCL 1 MG/ML IJ SOLN
1.0000 mg | INTRAMUSCULAR | Status: DC | PRN
  Administered 2023-01-20: 1 mg via INTRAVENOUS
  Filled 2023-01-20: qty 1

## 2023-01-20 MED ORDER — GLYCOPYRROLATE 1 MG PO TABS: 1.0000 mg | ORAL_TABLET | ORAL | Status: DC | PRN

## 2023-01-20 MED ORDER — FENTANYL CITRATE PF 50 MCG/ML IJ SOSY
50.0000 ug | PREFILLED_SYRINGE | INTRAMUSCULAR | Status: DC | PRN
  Administered 2023-01-21 (×3): 100 ug via INTRAVENOUS
  Filled 2023-01-20 (×3): qty 2

## 2023-01-20 MED ORDER — HALOPERIDOL LACTATE 2 MG/ML PO CONC: 2.0000 mg | Freq: Four times a day (QID) | ORAL | Status: DC | PRN

## 2023-01-20 MED ORDER — LORAZEPAM 2 MG/ML IJ SOLN: 1.0000 mg | INTRAMUSCULAR | Status: DC | PRN

## 2023-01-20 MED ORDER — HALOPERIDOL LACTATE 5 MG/ML IJ SOLN: 2.0000 mg | Freq: Four times a day (QID) | INTRAMUSCULAR | Status: DC | PRN

## 2023-01-20 MED ORDER — GLYCOPYRROLATE 0.2 MG/ML IJ SOLN: 0.2000 mg | INTRAMUSCULAR | Status: DC | PRN

## 2023-01-20 MED ORDER — DIPHENHYDRAMINE HCL 50 MG/ML IJ SOLN
25.0000 mg | Freq: Once | INTRAMUSCULAR | Status: AC
  Administered 2023-01-20: 25 mg via INTRAVENOUS
  Filled 2023-01-20: qty 1

## 2023-01-20 MED ORDER — FUROSEMIDE 10 MG/ML IJ SOLN
80.0000 mg | Freq: Two times a day (BID) | INTRAMUSCULAR | Status: DC
  Filled 2023-01-20: qty 8

## 2023-01-20 MED ORDER — ENSURE ENLIVE PO LIQD: 237.0000 mL | ORAL | Status: DC | PRN

## 2023-01-20 MED ORDER — HALOPERIDOL 1 MG PO TABS: 2.0000 mg | ORAL_TABLET | Freq: Four times a day (QID) | ORAL | Status: DC | PRN

## 2023-01-20 MED ORDER — LORAZEPAM 2 MG/ML PO CONC
1.0000 mg | ORAL | Status: DC | PRN
Start: 2023-01-20 — End: 2023-01-21

## 2023-01-20 MED ORDER — BIOTENE DRY MOUTH MT LIQD
15.0000 mL | Freq: Two times a day (BID) | OROMUCOSAL | Status: DC
  Administered 2023-01-20 – 2023-01-21 (×2): 15 mL via TOPICAL

## 2023-01-20 NOTE — Plan of Care (Signed)
  Problem: Education: Goal: Knowledge of General Education information will improve Description: Including pain rating scale, medication(s)/side effects and non-pharmacologic comfort measures Outcome: Not Progressing   Problem: Health Behavior/Discharge Planning: Goal: Ability to manage health-related needs will improve Outcome: Not Progressing   

## 2023-01-20 NOTE — TOC Progression Note (Signed)
Transition of Care [facility]) - Progression Note    Patient Details  Name: Jessica Torres MRN: [id number] Date of Birth: March 12, 1940  Transition of Care [facility]) CM/SW Contact  Delilah Shan, LCSWA Phone Number: 01/20/2023, 2:00 PM  Clinical Narrative:     CSW spoke with Leia Alf with [facility]. Quantae confirmed referral currently pending, will follow up tomorrow. CSW will continue to follow and assist with patients dc planning needs.  Expected Discharge Plan: Hospice Medical Facility Barriers to Discharge: Continued Medical Work up  Expected Discharge Plan and Services                                               Social Determinants of Health (SDOH) Interventions SDOH Screenings   Food Insecurity: No Food Insecurity (01/16/2023)  Housing: Low Risk  (01/16/2023)  Transportation Needs: No Transportation Needs (01/16/2023)  Utilities: Not At Risk (01/16/2023)  Alcohol Screen: Low Risk  (10/30/2022)  Depression (PHQ2-9): Medium Risk (02/21/2022)  Financial Resource Strain: Low Risk  (10/30/2022)  Physical Activity: Inactive (09/25/2020)  Social Connections: Unknown (08/27/2022)   Received from [facility]  Stress: No Stress Concern Present (10/12/2022)   Received from [facility]  Tobacco Use: Low Risk  (01/16/2023)    Readmission Risk Interventions    10/31/2022    5:12 PM  Readmission Risk Prevention Plan  Transportation Screening Complete  HRI or Home Care Consult Complete  Palliative Care Screening Not Applicable  Medication Review (RN Care Manager) Complete

## 2023-01-20 NOTE — Plan of Care (Signed)
  Problem: Education: Goal: Knowledge of General Education information will improve Description: Including pain rating scale, medication(s)/side effects and non-pharmacologic comfort measures Outcome: Progressing   Problem: Clinical Measurements: Goal: Ability to maintain clinical measurements within normal limits will improve Outcome: Progressing Goal: Cardiovascular complication will be avoided Outcome: Progressing   Problem: Pain Management: Goal: General experience of comfort will improve Outcome: Progressing   Problem: Clinical Measurements: Goal: Respiratory complications will improve Outcome: Not Progressing   Problem: Activity: Goal: Risk for activity intolerance will decrease Outcome: Not Progressing   Problem: Nutrition: Goal: Adequate nutrition will be maintained Outcome: Not Progressing   Problem: Elimination: Goal: Will not experience complications related to bowel motility Outcome: Not Progressing Goal: Will not experience complications related to urinary retention Outcome: Not Progressing   Problem: Education: Goal: Ability to demonstrate management of disease process will improve Outcome: Not Progressing

## 2023-01-20 NOTE — Plan of Care (Signed)

## 2023-01-20 NOTE — Progress Notes (Signed)
Daily Progress Note   Patient Name: Jessica Torres       Date: 01/20/2023 DOB: [birth year]  Age: 82 y.o. MRN#: [id number] Attending Physician: Zannie Cove, MD Primary Care Physician: Rebekah Chesterfield, NP Admit Date: 01/15/2023  Reason for Consultation/Follow-up: {Reason for Consult:23484}  Subjective: I have reviewed medical records including EPIC notes, MAR, and labs. Received report from primary RN - ***  All questions and concerns addressed. Encouraged to call with questions and/or concerns. PMT card provided.  Length of Stay: 4  Current Medications: Scheduled Meds:   feeding supplement  237 mL Oral BID BM   lactulose  10 g Oral Daily   levothyroxine  88 mcg Oral QAC breakfast   metoprolol succinate  12.5 mg Oral Daily   pantoprazole  40 mg Oral Daily    Continuous Infusions:  furosemide 120 mg (01/20/23 1008)   octreotide (SANDOSTATIN) 500 mcg in sodium chloride 0.9 % 250 mL (2 mcg/mL) infusion 25 mcg/hr (01/19/23 2037)    PRN Meds: acetaminophen **OR** acetaminophen, alum & mag hydroxide-simeth, HYDROcodone-acetaminophen, ondansetron **OR** ondansetron (ZOFRAN) IV  Physical Exam          Vital Signs: BP 97/60   Pulse 89   Temp 98.3 F (36.8 C) (Oral)   Resp 17   Ht 5\' 5"  (1.651 m)   Wt 57.5 kg   SpO2 100%   BMI 21.08 kg/m  SpO2: SpO2: 100 % O2 Device: O2 Device: Nasal Cannula O2 Flow Rate: O2 Flow Rate (L/min): 3 L/min  Intake/output summary:  Intake/Output Summary (Last 24 hours) at 01/20/2023 1304 Last data filed at 01/20/2023 0700 Gross per 24 hour  Intake 577.2 ml  Output 50 ml  Net 527.2 ml   LBM: Last BM Date : 01/19/23 Baseline Weight: Weight: 56.9 kg Most recent weight: Weight: 57.5 kg       Palliative Assessment/Data:      Patient  Active Problem List   Diagnosis Date Noted   Ascites 01/16/2023   Colitis 01/16/2023   Hyperbilirubinemia 01/16/2023   Transaminitis 01/16/2023   Chronic hypoxic respiratory failure (HCC) 01/16/2023   Gout 01/16/2023   Elevated troponin 01/16/2023   Ischemic cardiomyopathy 01/16/2023   Chronic idiopathic thrombocytopenia (HCC) 01/16/2023   Acute exacerbation of CHF (congestive heart failure) (HCC) 01/15/2023   Acute on chronic systolic CHF (congestive  heart failure) (HCC) 11/12/2022   Palliative care encounter 11/01/2022   Bilateral pleural effusion 10/31/2022   Hyponatremia 10/30/2022   Acute on chronic combined systolic and diastolic CHF (congestive heart failure) (HCC) 10/29/2022   AKI (acute kidney injury) (HCC) 10/29/2022   Normocytic anemia 07/15/2022   Melena 07/15/2022   Decompensated cirrhosis (HCC) 07/13/2022   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   Gastritis and gastroduodenitis 04/28/2022   NSTEMI (non-ST elevated myocardial infarction) (HCC) 04/27/2022   Gastrointestinal hemorrhage 04/27/2022   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 disease 04/27/2022   Heart failure with preserved ejection fraction (HCC) 04/27/2022   Chronic cough 04/27/2022   History of CVA (cerebrovascular accident) 04/27/2022   Osteopenia 11/28/2020   Iron deficiency 10/11/2020   CKD (chronic kidney disease), stage IV (HCC) 05/25/2020   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   Chronic bilateral low back pain without sciatica 07/13/2019   Carotid stenosis, right 12/10/2017   Occlusion and stenosis of right carotid artery 12/10/2017   Left hip pain 06/04/2017   Claudication in peripheral vascular disease (HCC) 10/30/2016   Vitamin D deficiency 05/07/2016   Gastroesophageal reflux disease without esophagitis 01/28/2016   Coronary artery disease due to lipid rich plaque 10/18/2015   Hypothyroidism 10/18/2015   S/P -Billat SFA PTA with restenosis  07/06/2014   Peripheral vascular angioplasty status  07/06/2014   Hypertension associated with diabetes (HCC) 06/28/2014   Hyperlipidemia-statin intolerant 06/28/2014   Insulin dependent type 2 diabetes mellitus (HCC) 06/28/2014   Peripheral arterial disease - Bilateral SFA  06/28/2014    Palliative Care Assessment & Plan   Patient Profile: ***  Assessment: Principal Problem:   Acute exacerbation of CHF (congestive heart failure) (HCC) Active Problems:   Insulin dependent type 2 diabetes mellitus (HCC)   Hypothyroidism   CKD (chronic kidney disease), stage IV (HCC)   Decompensated cirrhosis (HCC)   Normocytic anemia   AKI (acute kidney injury) (HCC)   Hyponatremia   Bilateral pleural effusion   Ascites   Colitis   Hyperbilirubinemia   Transaminitis   Chronic hypoxic respiratory failure (HCC)   Gout   Elevated troponin   Ischemic cardiomyopathy   Chronic idiopathic thrombocytopenia (HCC)   Recommendations/Plan: ***  Goals of Care and Additional Recommendations: Limitations on Scope of Treatment: {Recommended Scope and Preferences:21019}  Code Status:    Code Status Orders  (From admission, onward)           Start     Ordered   01/16/23 0313  Do not attempt resuscitation (DNR)- Limited -Do Not Intubate (DNI)  Continuous       Question Answer Comment  If pulseless and not breathing No CPR or chest compressions.   In Pre-Arrest Conditions (Patient Is Breathing and Has A Pulse) Do not intubate. Provide all appropriate non-invasive medical interventions. Avoid ICU transfer unless indicated or required.   Consent: Discussion documented in EHR or advanced directives reviewed      01/16/23 0313           Code Status History     Date Active Date Inactive Code Status Order ID Comments User Context   11/12/2022 2029 11/15/2022 1906 Limited: Do not attempt resuscitation (DNR) -DNR-LIMITED -Do Not Intubate/DNI  [id number]  Briscoe Deutscher, MD Inpatient   11/05/2022 1015 11/07/2022 2047 Limited: Do not attempt  resuscitation (DNR) -DNR-LIMITED -Do Not Intubate/DNI  [id number]  Zannie Cove, MD Inpatient   10/29/2022 2115 11/05/2022 1015 Full Code [id number]  Anselm Jungling, DO Inpatient   07/11/2022 2003  07/17/2022 2107 Full Code [id number]  Levie Heritage, DO ED   04/27/2022 0730 04/30/2022 2203 Full Code [id number]  Clydie Braun, MD ED   10/30/2016 1449 10/31/2016 1441 Full Code [id number]  Runell Gess, MD Inpatient   01/22/2015 1454 01/23/2015 1435 Full Code [id number]  Runell Gess, MD Inpatient   01/11/2015 0927 01/11/2015 1728 Full Code [id number]  Runell Gess, MD Inpatient   08/17/2014 1045 08/18/2014 1914 Full Code [id number]  Runell Gess, MD Inpatient   07/06/2014 1505 07/07/2014 1455 Full Code [id number]  Runell Gess, MD Inpatient      Advance Directive Documentation    Flowsheet Row Most Recent Value  Type of Advance Directive Healthcare Power of Attorney  Pre-existing out of facility DNR order (yellow form or pink MOST form) --  "MOST" Form in Place? --       Prognosis:  {Palliative Care Prognosis:23504}  Discharge Planning: {Palliative dispostion:23505}  Care plan was discussed with ***  Thank you for allowing the Palliative Medicine Team to assist in the care of this patient.   Time In: *** Time Out: *** Total Time *** Prolonged Time Billed  {YES GB:15176}       Haskel Khan, NP  Please contact Palliative Medicine Team phone at [phone] for questions and concerns.   *Portions of this note are a verbal dictation therefore any spelling and/or grammatical errors are due to the "[employer]agon [employer] One" system interpretation.

## 2023-01-20 NOTE — Progress Notes (Signed)
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 -In the ED she was tachycardic, blood pressure 150/62, lactate 1.9, labs with sodium 131, creatinine 2.5, albumin 3.2, hemoglobin 11.3, WBC 8.9, troponin 78, 69, bili 3.9, BNP 2619, chest x-ray with moderate right pleural effusion and interstitial thickening?  Edema CT abdomen pelvis with cirrhosis, moderate to large volume ascites, colon wall thickening?  Colitis, moderate right and small left effusions -11/8, paracentesis 3 L of amber fluid drained -11/9, palliative care meeting, continue DNR/DNI -GI recommended palliative care and signed off -Cardiology following, palliative care following as well, kidney function continues to worsen, poor response to diuretics -Palliative meeting 11/10, family agrees to residential hospice  Subjective: -Feels fair overall, no urine output yesterday  Assessment and Plan:  Acute on chronic systolic CHF Severe mitral regurgitation, suspected CAD -Worsened in the background of decompensated cirrhosis and CKD 4 -Seventh hospitalization this year with volume overload, prognosis remains poor -Poor response to diuretics, creatinine continued to worsen -Very poor prognosis with significant chronic disease burden, called and updated son Thayer Ohm, palliative consulted, family agreeable to hospice placement -GDMT limited by CKD 4, she is not a candidate for advanced therapies -Palliative care  team following, hospice seems to be the only option  Decompensated liver cirrhosis, ascites History of esophageal varices Mild hepatic encephalopathy -Ultrasound paracentesis completed - 3 L drained, fluid not suggestive of SBP -Discontinued antibiotics, diuretics as noted above -Ammonia level is elevated, started on lactulose  AKI on CKD 4 Hepatorenal syndrome Hyponatremia -Baseline creatinine is around 2 -Multifactorial from cardiorenal,, component of hepatorenal syndrome as well, - given multiple doses of IV albumin -On octreotide, diuretics -No response to diuretics, she is not a candidate for dialysis or advanced therapies -Hospice has been recommended to patient's son and he was agreeable based on our conversation yesterday  Colon wall thickening involving transverse and distal colon -Suspect this is related to ascites, low albumin -Ascitic fluid not suggestive of SBP, antibiotics discontinued  Chronic anemia, thrombocytopenia Relatively stable monitor  Type 2 diabetes mellitus - HbA1c was 5.4 in September, on glargine at baseline, SSI for now  Chronic hypoxic respiratory failure on 2 L home O2 at baseline, stable  Hypothyroidism  -continue Synthroid  DVT prophylaxis: SCDs Code Status: DNR  Family Communication: No family at bedside, called and updated son Thayer Ohm 11/9, 11/11 Disposition Plan: Anticipate hospice facility  Consultants:    Procedures:   Antimicrobials:    Objective: Vitals:   01/20/23 0441 01/20/23 0500 01/20/23 0533 01/20/23 1015  BP:    97/60  Pulse: 99  94 89  Resp:  17    Temp: 98.3 F (36.8 C)     TempSrc: Oral Oral    SpO2:   100%   Weight:  57.5 kg    Height:        Intake/Output Summary (Last 24 hours) at 01/20/2023 1101 Last data filed at 01/20/2023 0700 Gross per 24 hour  Intake 577.2 ml  Output 50 ml  Net 527.2 ml   [employer]  01/17/23 0351 01/18/23 0323 01/20/23 0500  Weight: 58.4 kg 57.8 kg 57.5 kg     Examination:  General exam: Chronically ill debilitated cachectic female laying in bed, AAOx3 HEENT: Positive JVD CVS: S1-S2, regular rhythm Lungs: Decreased breath sounds at the bases Abdomen: Soft, nontender, less distended, bowel sounds present Extremities: Positive edema  Psychiatry: Flat affect    Data Reviewed:   CBC: Recent Labs  Lab 01/15/23 1552 01/16/23 0455 01/17/23 0325  WBC 8.9 8.2 9.7  NEUTROABS 6.4  --   --   HGB 11.3* 9.7* 9.1*  HCT 31.3* 27.0* 25.8*  MCV 97.5 98.2 98.1  PLT 151 135* 135*   Basic Metabolic Panel: Recent Labs  Lab 01/16/23 0455 01/17/23 0325 01/18/23 0423 01/19/23 0558 01/20/23 0558  NA 130* 133* 130* 132* 127*  K 3.4* 4.6 3.7 3.8 4.0  CL 94* 97* 95* 99 93*  CO2 24 23 23 22 22   GLUCOSE 108* 137* 201* 150* 161*  BUN 29* 32* 38* 46* 55*  CREATININE 2.52* 2.55* 2.92* 3.85* 4.40*  CALCIUM 9.3 9.8 9.8 9.7 9.5   GFR: Estimated Creatinine Clearance: 9.2 mL/min (A) (by C-G formula based on SCr of 4.4 mg/dL (H)). Liver Function Tests: Recent Labs  Lab 01/15/23 1552 01/16/23 0455 01/17/23 0325 01/18/23 0423  AST 46* 43* 62* 53*  ALT 13 16 17 17   ALKPHOS 46 40 36* 32*  BILITOT 8.7* 7.7* 7.9* 8.0*  PROT 7.1 6.2* 6.3* 6.9  ALBUMIN 3.2* 2.5* 3.0* 3.6   Recent Labs  Lab 01/15/23 1552  LIPASE 47   Recent Labs  Lab 01/16/23 0803  AMMONIA 81*   Coagulation Profile: Recent Labs  Lab 01/16/23 0455  INR 1.6*   Cardiac Enzymes: No results for input(s): "CKTOTAL", "CKMB", "CKMBINDEX", "TROPONINI" in the last 168 hours. BNP (last 3 results) No results for input(s): "PROBNP" in the last 8760 hours. HbA1C: No results for input(s): "HGBA1C" in the last 72 hours. CBG: Recent Labs  Lab 01/16/23 0725 01/16/23 1138 01/16/23 1616 01/17/23 0731 01/17/23 1156  GLUCAP 97 97 161* 126* 199*   Lipid Profile: No results for input(s): "CHOL", "HDL", "LDLCALC", "TRIG", "CHOLHDL", "LDLDIRECT" in the last 72 hours. Thyroid  Function Tests: No results for input(s): "TSH", "T4TOTAL", "FREET4", "T3FREE", "THYROIDAB" in the last 72 hours. Anemia Panel: No results for input(s): "VITAMINB12", "FOLATE", "FERRITIN", "TIBC", "IRON", "RETICCTPCT" in the last 72 hours.  Urine analysis:    Component Value Date/Time   COLORURINE YELLOW 01/16/2023 0506   APPEARANCEUR CLEAR 01/16/2023 0506   APPEARANCEUR Clear 10/28/2019 0900   LABSPEC 1.008 01/16/2023 0506   PHURINE 5.0 01/16/2023 0506   GLUCOSEU NEGATIVE 01/16/2023 0506   HGBUR NEGATIVE 01/16/2023 0506   BILIRUBINUR NEGATIVE 01/16/2023 0506   BILIRUBINUR Negative 10/28/2019 0900   KETONESUR NEGATIVE 01/16/2023 0506   PROTEINUR NEGATIVE 01/16/2023 0506   NITRITE NEGATIVE 01/16/2023 0506   LEUKOCYTESUR NEGATIVE 01/16/2023 0506   Sepsis Labs: @LABRCNTIP (procalcitonin:4,lacticidven:4)  ) Recent Results (from the past 240 hour(s))  Resp panel by RT-PCR (RSV, Flu A&B, Covid) Anterior Nasal Swab     Status: None   Collection Time: 01/15/23  9:53 PM   Specimen: Anterior Nasal Swab  Result Value Ref Range Status   SARS Coronavirus 2 by RT PCR NEGATIVE NEGATIVE Final    Comment: (NOTE) SARS-CoV-2 target nucleic acids are NOT DETECTED.  The SARS-CoV-2 RNA is generally detectable in upper respiratory specimens during the acute phase of infection. The lowest concentration of SARS-CoV-2 viral copies this assay can detect  is 138 copies/mL. A negative result does not preclude SARS-Cov-2 infection and should not be used as the sole basis for treatment or other patient management decisions. A negative result may occur with  improper specimen collection/handling, submission of specimen other than nasopharyngeal swab, presence of viral mutation(s) within the areas targeted by this assay, and inadequate number of viral copies(<138 copies/mL). A negative result must be combined with clinical observations, patient history, and epidemiological information. The expected result  is Negative.  Fact Sheet for Patients:  BloggerCourse.com  Fact Sheet for Healthcare Providers:  SeriousBroker.it  This test is no t yet approved or cleared by the [place] FDA and  has been authorized for detection and/or diagnosis of SARS-CoV-2 by FDA under an Emergency Use Authorization (EUA). This EUA will remain  in effect (meaning this test can be used) for the duration of the COVID-19 declaration under Section 564(b)(1) of the Act, 21 U.S.C.section 360bbb-3(b)(1), unless the authorization is terminated  or revoked sooner.       Influenza A by PCR NEGATIVE NEGATIVE Final   Influenza B by PCR NEGATIVE NEGATIVE Final    Comment: (NOTE) The Xpert Xpress SARS-CoV-2/FLU/RSV plus assay is intended as an aid in the diagnosis of influenza from Nasopharyngeal swab specimens and should not be used as a sole basis for treatment. Nasal washings and aspirates are unacceptable for Xpert Xpress SARS-CoV-2/FLU/RSV testing.  Fact Sheet for Patients: BloggerCourse.com  Fact Sheet for Healthcare Providers: SeriousBroker.it  This test is not yet approved or cleared by the [place] FDA and has been authorized for detection and/or diagnosis of SARS-CoV-2 by FDA under an Emergency Use Authorization (EUA). This EUA will remain in effect (meaning this test can be used) for the duration of the COVID-19 declaration under Section 564(b)(1) of the Act, 21 U.S.C. section 360bbb-3(b)(1), unless the authorization is terminated or revoked.     Resp Syncytial Virus by PCR NEGATIVE NEGATIVE Final    Comment: (NOTE) Fact Sheet for Patients: BloggerCourse.com  Fact Sheet for Healthcare Providers: SeriousBroker.it  This test is not yet approved or cleared by the [place] FDA and has been authorized for detection and/or diagnosis of  SARS-CoV-2 by FDA under an Emergency Use Authorization (EUA). This EUA will remain in effect (meaning this test can be used) for the duration of the COVID-19 declaration under Section 564(b)(1) of the Act, 21 U.S.C. section 360bbb-3(b)(1), unless the authorization is terminated or revoked.  Performed at [facility], [address], [place], [place] [address]   C Difficile Quick Screen w PCR reflex     Status: None   Collection Time: 01/16/23  3:17 AM   Specimen: STOOL  Result Value Ref Range Status   C Diff antigen NEGATIVE NEGATIVE Final   C Diff toxin NEGATIVE NEGATIVE Final   C Diff interpretation No C. difficile detected.  Final    Comment: Performed at [facility], [address] [place], [place] [address]  Gastrointestinal Panel by PCR , Stool     Status: None   Collection Time: 01/16/23  3:17 AM   Specimen: Stool  Result Value Ref Range Status   Campylobacter species NOT DETECTED NOT DETECTED Final   Plesimonas shigelloides NOT DETECTED NOT DETECTED Final   Salmonella species NOT DETECTED NOT DETECTED Final   Yersinia enterocolitica NOT DETECTED NOT DETECTED Final   Vibrio species NOT DETECTED NOT DETECTED Final   Vibrio cholerae NOT DETECTED NOT DETECTED Final   Enteroaggregative E coli (EAEC) NOT DETECTED  NOT DETECTED Final   Enteropathogenic E coli (EPEC) NOT DETECTED NOT DETECTED Final   Enterotoxigenic E coli (ETEC) NOT DETECTED NOT DETECTED Final   Shiga like toxin producing E coli (STEC) NOT DETECTED NOT DETECTED Final   Shigella/Enteroinvasive E coli (EIEC) NOT DETECTED NOT DETECTED Final   Cryptosporidium NOT DETECTED NOT DETECTED Final   Cyclospora cayetanensis NOT DETECTED NOT DETECTED Final   Entamoeba histolytica NOT DETECTED NOT DETECTED Final   Giardia lamblia NOT DETECTED NOT DETECTED Final   Adenovirus F40/41 NOT DETECTED NOT DETECTED Final   Astrovirus NOT DETECTED NOT DETECTED Final   Norovirus GI/GII NOT DETECTED NOT  DETECTED Final   Rotavirus A NOT DETECTED NOT DETECTED Final   Sapovirus (I, II, IV, and V) NOT DETECTED NOT DETECTED Final    Comment: Performed at [facility], [address] [place], [place] [address]  Culture, blood (Routine X 2) w Reflex to ID Panel     Status: None (Preliminary result)   Collection Time: 01/16/23  4:55 AM   Specimen: BLOOD LEFT HAND  Result Value Ref Range Status   Specimen Description BLOOD LEFT HAND  Final   Special Requests   Final    BOTTLES DRAWN AEROBIC AND ANAEROBIC Blood Culture adequate volume   Culture   Final    NO GROWTH 4 DAYS Performed at [facility], [address], [place], [place] [address]    Report Status PENDING  Incomplete  Culture, blood (Routine X 2) w Reflex to ID Panel     Status: None (Preliminary result)   Collection Time: 01/16/23  4:55 AM   Specimen: BLOOD  Result Value Ref Range Status   Specimen Description BLOOD LEFT ANTECUBITAL  Final   Special Requests   Final    BOTTLES DRAWN AEROBIC AND ANAEROBIC Blood Culture adequate volume   Culture   Final    NO GROWTH 4 DAYS Performed at [facility], [address] [place], [place] [address]    Report Status PENDING  Incomplete  Culture, body fluid w Gram Stain-bottle     Status: None (Preliminary result)   Collection Time: 01/16/23 11:29 AM   Specimen: Peritoneal Washings  Result Value Ref Range Status   Specimen Description PERITONEAL ABDOMEN  Final   Special Requests NONE  Final   Culture   Final    NO GROWTH 4 DAYS Performed at [facility], [address] [place], [place] [address]    Report Status PENDING  Incomplete  Gram stain     Status: None   Collection Time: 01/16/23 11:29 AM   Specimen: Peritoneal Washings  Result Value Ref Range Status   Specimen Description PERITONEAL ABDOMEN  Final   Special Requests NONE  Final   Gram Stain   Final    WBC PRESENT, PREDOMINANTLY MONONUCLEAR NO ORGANISMS SEEN CYTOSPIN SMEAR Performed at  [facility], [address], [place], [place] [address]    Report Status 01/16/2023 FINAL  Final     Radiology Studies: No results found.   Scheduled Meds:  feeding supplement  237 mL Oral BID BM   lactulose  10 g Oral Daily   levothyroxine  88 mcg Oral QAC breakfast   metoprolol succinate  12.5 mg Oral Daily   pantoprazole  40 mg Oral Daily   Continuous Infusions:  furosemide 120 mg (01/20/23 1008)   octreotide (SANDOSTATIN) 500 mcg in sodium chloride 0.9 % 250 mL (2 mcg/mL) infusion 25 mcg/hr (  01/19/23 2037)      LOS: 4 days    Time spent:    Zannie Cove, MD [facility]   01/20/2023, 11:01 AM

## 2023-01-21 DIAGNOSIS — I509 Heart failure, unspecified: Secondary | ICD-10-CM | POA: Diagnosis not present

## 2023-01-21 DIAGNOSIS — Z515 Encounter for palliative care: Secondary | ICD-10-CM | POA: Diagnosis not present

## 2023-01-21 DIAGNOSIS — J90 Pleural effusion, not elsewhere classified: Secondary | ICD-10-CM | POA: Diagnosis not present

## 2023-01-21 DIAGNOSIS — R52 Pain, unspecified: Secondary | ICD-10-CM

## 2023-01-21 DIAGNOSIS — R188 Other ascites: Secondary | ICD-10-CM | POA: Diagnosis not present

## 2023-01-21 DIAGNOSIS — L299 Pruritus, unspecified: Secondary | ICD-10-CM

## 2023-01-21 LAB — CULTURE, BODY FLUID W GRAM STAIN -BOTTLE: Culture: NO GROWTH

## 2023-01-21 LAB — CULTURE, BLOOD (ROUTINE X 2)
Culture: NO GROWTH
Culture: NO GROWTH
Special Requests: ADEQUATE
Special Requests: ADEQUATE

## 2023-01-21 MED ORDER — POLYVINYL ALCOHOL 1.4 % OP SOLN: 1.0000 [drp] | Freq: Four times a day (QID) | OPHTHALMIC | Status: AC | PRN

## 2023-01-21 MED ORDER — GLYCOPYRROLATE 1 MG PO TABS: 1.0000 mg | ORAL_TABLET | ORAL | Status: AC | PRN

## 2023-01-21 MED ORDER — DIPHENHYDRAMINE HCL 50 MG/ML IJ SOLN
50.0000 mg | INTRAMUSCULAR | Status: DC | PRN
Start: 2023-01-21 — End: 2023-01-21
  Administered 2023-01-21: 50 mg via INTRAVENOUS
  Filled 2023-01-21: qty 1

## 2023-01-21 MED ORDER — BIOTENE DRY MOUTH MT LIQD: 15.0000 mL | Freq: Two times a day (BID) | OROMUCOSAL | Status: AC

## 2023-01-21 MED ORDER — FUROSEMIDE 40 MG PO TABS: 40.0000 mg | ORAL_TABLET | Freq: Two times a day (BID) | ORAL | Status: AC

## 2023-01-21 MED ORDER — HYDROCODONE-ACETAMINOPHEN 5-325 MG PO TABS: 1.0000 | ORAL_TABLET | Freq: Four times a day (QID) | ORAL | Status: AC | PRN

## 2023-01-21 MED ORDER — HALOPERIDOL 2 MG PO TABS: 2.0000 mg | ORAL_TABLET | Freq: Four times a day (QID) | ORAL | Status: AC | PRN

## 2023-01-21 MED ORDER — LORAZEPAM 2 MG/ML PO CONC: 1.0000 mg | ORAL | Status: AC | PRN

## 2023-01-21 NOTE — TOC Transition Note (Signed)
Transition of Care [facility]) - CM/SW Discharge Note   Patient Details  Name: Jessica Torres MRN: [id number] Date of Birth: May 17, 1940  [facility]) CM/SW Contact:  Deatra Robinson, [place] Phone Number: 01/21/2023, 4:25 PM   Clinical Narrative:  call received from [facility] with [facility] (formerly [facility]) who reports they are prepared to admit pt today. Pt's son has completed admission consents and agreeable to dc per [facility]. RN provided with number for report and PTAR arranged for transport. SW signing off at dc.   Dellie Burns, MSW, LCSW [phone] (coverage)       Final next level of care: Hospice Medical Facility Barriers to Discharge: Barriers Resolved   Patient Goals and CMS Choice      Discharge Placement                  Patient to be transferred to facility by: PTAR Name of family member notified: Chris/son Patient and family notified of of transfer: 01/21/23  Discharge Plan and Services Additional resources added to the After Visit Summary for                                       Social Determinants of Health (SDOH) Interventions SDOH Screenings   Food Insecurity: No Food Insecurity (01/16/2023)  Housing: Low Risk  (01/16/2023)  Transportation Needs: No Transportation Needs (01/16/2023)  Utilities: Not At Risk (01/16/2023)  Alcohol Screen: Low Risk  (10/30/2022)  Depression (PHQ2-9): Medium Risk (02/21/2022)  Financial Resource Strain: Low Risk  (10/30/2022)  Physical Activity: Inactive (09/25/2020)  Social Connections: Unknown (08/27/2022)   Received from [facility]  Stress: No Stress Concern Present (10/12/2022)   Received from [facility]  Tobacco Use: Low Risk  (01/16/2023)     Readmission Risk Interventions    10/31/2022    5:12 PM  Readmission Risk Prevention Plan  Transportation Screening Complete  HRI or Home Care Consult Complete  Palliative Care Screening Not Applicable  Medication Review (RN  Care Manager) Complete

## 2023-01-21 NOTE — Discharge Summary (Signed)
Physician Discharge Summary   [name] MRN: [id number] DOB: 1940-12-03  Admit date:     01/15/2023  Discharge date: 01/21/23  Discharge Physician: Rickey Barbara   PCP: Rebekah Chesterfield, NP   Recommendations at discharge:    Follow up with hospice services  Discharge Diagnoses: Principal Problem:   Acute exacerbation of CHF (congestive heart failure) (HCC) Active Problems:   CKD (chronic kidney disease), stage IV (HCC)   Decompensated cirrhosis (HCC)   AKI (acute kidney injury) (HCC)   Bilateral pleural effusion   Ascites   Colitis   Hyperbilirubinemia   Transaminitis   Insulin dependent type 2 diabetes mellitus (HCC)   Hypothyroidism   Normocytic anemia   Hyponatremia   Chronic hypoxic respiratory failure (HCC)   Gout   Elevated troponin   Ischemic cardiomyopathy   Chronic idiopathic thrombocytopenia (HCC)  Resolved Problems:   * No resolved hospital problems. 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 -In the ED she was tachycardic, blood pressure 150/62, lactate 1.9, labs with sodium 131, creatinine 2.5, albumin 3.2, hemoglobin 11.3, WBC 8.9, troponin 78, 69, bili 3.9, BNP 2619, chest x-ray with moderate right pleural effusion and interstitial thickening?  Edema CT abdomen pelvis with cirrhosis, moderate to large volume ascites, colon wall thickening?  Colitis, moderate right and small left effusions -11/8, paracentesis 3 L of amber fluid drained -11/9, palliative care meeting, continue DNR/DNI -GI recommended palliative care  and signed off -Cardiology following, palliative care following as well, kidney function continues to worsen, poor response to diuretics -Palliative meeting 11/10, family agrees to residential hospice    Assessment and Plan: Acute on chronic systolic CHF Severe mitral regurgitation, suspected CAD -Worsened in the background of decompensated cirrhosis and CKD 4 -Seventh hospitalization this year with volume overload, prognosis remains poor -Poor response to diuretics, creatinine continued to worsen -Very poor prognosis with significant chronic disease burden, called and updated son Thayer Ohm, palliative consulted, family agreeable to hospice placement -GDMT limited by CKD 4, she is not a candidate for advanced therapies -Palliative care team following, hospice seems to be the only option. Plan for residential hospice   Decompensated liver cirrhosis, ascites History of esophageal varices Mild hepatic encephalopathy -Ultrasound paracentesis completed - 3 L drained, fluid not suggestive of SBP -Discontinued antibiotics, diuretics as noted above -Ammonia level is elevated, started on lactulose   AKI on CKD 4 Hepatorenal syndrome Hyponatremia -Baseline creatinine is around 2 -Multifactorial from cardiorenal,, component of hepatorenal syndrome as well, - given multiple doses of IV albumin -On octreotide, diuretics -No response to diuretics, she is not a candidate for dialysis or advanced therapies -Hospice has been recommended to patient's son and he was agreeable based on our conversation this visit   Colon wall thickening involving transverse and distal colon -Suspect this is related to ascites, low albumin -Ascitic fluid not suggestive of SBP, antibiotics discontinued   Chronic anemia, thrombocytopenia Relatively stable monitor   Type 2 diabetes mellitus - HbA1c was 5.4 in September, on glargine at baseline, on SSI for now while in hospital   Chronic hypoxic respiratory failure  stable   Hypothyroidism  -  continued on Synthroid    Consultants: Palliative Care, GI, Cardiology Procedures performed:   Disposition: Hospice care Diet recommendation:  Regular diet, Comfort feeding  DISCHARGE MEDICATION: Allergies as of 01/21/2023       Reactions   Dilaudid [hydromorphone] Rash   Developed rash, itching after dilaudid 11/12    Morphine And Codeine Swelling, Other (See Comments)   Facial swelling   Latex Rash   Statins Other (See Comments)   Myalgias        Medication List     STOP taking these medications    Accu-Chek Softclix Lancets lancets   aspirin 81 MG chewable tablet   BD Pen Needle Nano 2nd Gen 32G X 4 MM Misc Generic drug: Insulin Pen Needle   clopidogrel 75 MG tablet Commonly known as: PLAVIX   ezetimibe 10 MG tablet Commonly known as: ZETIA   fenofibrate 160 MG tablet   FREESTYLE LITE test strip Generic drug: glucose blood   levothyroxine 88 MCG tablet Commonly known as: SYNTHROID   meclizine 25 MG tablet Commonly known as: ANTIVERT   ondansetron 4 MG tablet Commonly known as: Zofran   pantoprazole 40 MG tablet Commonly known as: PROTONIX   potassium chloride SA 20 MEQ tablet Commonly known as: KLOR-CON M   torsemide 20 MG tablet Commonly known as: DEMADEX   Toujeo Max SoloStar 300 UNIT/ML Solostar Pen Generic drug: insulin glargine (2 Unit Dial)   traZODone 50 MG tablet Commonly known as: DESYREL       TAKE these medications    antiseptic oral rinse Liqd Apply 15 mLs topically 2 (two) times daily.   furosemide 40 MG tablet Commonly known as: Lasix Take 1 tablet (40 mg total) by mouth 2 (two) times daily.   glycopyrrolate 1 MG tablet Commonly known as: ROBINUL Take 1 tablet (1 mg total) by mouth every 4 (four) hours as needed (excessive secretions).   haloperidol 2 MG tablet Commonly known as: HALDOL Take 1 tablet (2 mg total) by mouth every 6 (six) hours as needed for agitation (or delirium).    HYDROcodone-acetaminophen 5-325 MG tablet Commonly known as: NORCO/VICODIN Take 1 tablet by mouth every 6 (six) hours as needed for moderate pain (pain score 4-6).   LORazepam 2 MG/ML concentrated solution Commonly known as: ATIVAN Place 0.5 mLs (1 mg total) under the tongue every hour as needed for anxiety, seizure or sleep (distress).   metoprolol succinate 25 MG 24 hr tablet Commonly known as: Toprol XL Take 0.5 tablets (12.5 mg total) by mouth daily.   ondansetron 8 MG disintegrating tablet Commonly known as: ZOFRAN-ODT Take 8 mg by mouth as needed for nausea or vomiting.   polyvinyl alcohol 1.4 % ophthalmic solution Commonly known as: LIQUIFILM TEARS Place 1 drop into both eyes 4 (four) times daily as needed for dry eyes.        Discharge Exam: Filed Weights   01/17/23 0351 01/18/23 0323 01/20/23 0500  Weight: 58.4 kg 57.8 kg 57.5 kg   General exam: Awake, laying in bed, in nad Respiratory system: Normal respiratory effort, no wheezing Cardiovascular system: regular rate, s1, s2 Gastrointestinal system: Soft, nondistended, positive BS Central nervous system: CN2-12 grossly intact, strength intact Extremities: Perfused, no clubbing Skin: Normal skin turgor, no notable skin lesions seen Psychiatry: difficult to assess given mentation  Condition at discharge: poor  The results of significant diagnostics from this hospitalization (including imaging, microbiology, ancillary and laboratory) are listed below for reference.   Imaging Studies: IR Paracentesis  Result  Date: 01/16/2023 INDICATION: 82 year old female. History of cirrhosis with portal hypertension, esophageal varices, CHF. Patient presents for therapeutic and diagnostic paracentesis EXAM: ULTRASOUND GUIDED THERAPEUTIC AND DIAGNOSTIC PARACENTESIS MEDICATIONS: Lidocaine 1% 10 mL COMPLICATIONS: None immediate. PROCEDURE: Informed written consent was obtained from the patient after a discussion of the risks,  benefits and alternatives to treatment. A timeout was performed prior to the initiation of the procedure. Initial ultrasound scanning demonstrates a small amount of ascites within the right lower abdominal quadrant. The right lower abdomen was prepped and draped in the usual sterile fashion. 1% lidocaine was used for local anesthesia. Following this, a 19 gauge, 7-cm, Yueh catheter was introduced. An ultrasound image was saved for documentation purposes. The paracentesis was performed. The catheter was removed and a dressing was applied. The patient tolerated the procedure well without immediate post procedural complication. FINDINGS: A total of approximately 3 L of amber color fluid was removed. Samples were sent to the laboratory as requested by the clinical team. IMPRESSION: Successful ultrasound-guided therapeutic and diagnostic paracentesis yielding 3 liters of peritoneal fluid. Performed by Anders Grant NP PLAN: If the patient eventually requires >/=2 paracenteses in a 30 day period, candidacy for formal evaluation by the [facility] will be assessed. Electronically Signed   By: Gilmer Mor D.O.   On: 01/16/2023 14:06   ECHOCARDIOGRAM LIMITED  Result Date: 01/16/2023    ECHOCARDIOGRAM LIMITED REPORT   Patient Name:   Jessica Torres Date of Exam: 01/16/2023 Medical Rec #:  [id number]      Height:       65.0 in Accession #:    [id number]     Weight:       125.4 lb Date of Birth:  [birth year]      BSA:          1.622 m Patient Age:    82 years       BP:           114/54 mmHg Patient Gender: F              HR:           112 bpm. Exam Location:  Inpatient Procedure: Limited Echo, Color Doppler and Cardiac Doppler Indications:    acute systolic chf  History:        Patient has prior history of Echocardiogram examinations, most                 recent 11/01/2022. Cardiomyopathy and CHF, CAD, chronic kidney                 disease and PAD,  Signs/Symptoms:Murmur; Risk                 Factors:Hypertension, Dyslipidemia and Diabetes.  Sonographer:    Delcie Roch RDCS Referring Phys: [id number] SUBRINA SUNDIL IMPRESSIONS  1. LV function is severely depressed There is thickening of the basal lateral,base/mid anterior, basal inferior and base/mid septal walls. Akinetic elsewhere .Marland Kitchen Left ventricular ejection fraction, by estimation, is 20 to 25%. The left ventricle has severely decreased function. The left ventricle demonstrates global hypokinesis.  2. Right ventricular systolic function is normal. The right ventricular size is normal. There is moderately elevated pulmonary artery systolic pressure.  3. Moderate to severe mitral valve regurgitation.  4. The aortic valve is tricuspid. Aortic valve regurgitation is trivial. Aortic valve sclerosis/calcification is present, without any evidence of aortic stenosis.  5. The inferior vena cava is dilated in size with <50%  respiratory variability, suggesting right atrial pressure of 15 mmHg. FINDINGS  Left Ventricle: LV function is severely depressed There is thickening of the basal lateral,base/mid anterior, basal inferior and base/mid septal walls. Akinetic elsewhere. Left ventricular ejection fraction, by estimation, is 20 to 25%. The left ventricle has severely decreased function. The left ventricle demonstrates global hypokinesis. Right Ventricle: The right ventricular size is normal. Right vetricular wall thickness was not assessed. Right ventricular systolic function is normal. There is moderately elevated pulmonary artery systolic pressure. The tricuspid regurgitant velocity is  3.33 m/s, and with an assumed right atrial pressure of 8 mmHg, the estimated right ventricular systolic pressure is 52.4 mmHg. Left Atrium: Left atrial size was normal in size. Right Atrium: Right atrial size was normal in size. Pericardium: There is no evidence of pericardial effusion. Mitral Valve: Moderate to severe mitral  valve regurgitation. Tricuspid Valve: The tricuspid valve is normal in structure. Tricuspid valve regurgitation is trivial. Aortic Valve: The aortic valve is tricuspid. Aortic valve regurgitation is trivial. Aortic valve sclerosis/calcification is present, without any evidence of aortic stenosis. Aorta: The aortic root and ascending aorta are structurally normal, with no evidence of dilitation. Venous: The inferior vena cava is dilated in size with less than 50% respiratory variability, suggesting right atrial pressure of 15 mmHg. Additional Comments: Spectral Doppler performed. Color Doppler performed.  LEFT VENTRICLE PLAX 2D LVIDd:         5.00 cm LVIDs:         4.70 cm LV PW:         0.90 cm LV IVS:        0.60 cm LVOT diam:     1.40 cm LV SV:         32 LV SV Index:   20 LVOT Area:     1.54 cm  LV Volumes (MOD) LV vol d, MOD A4C: 120.0 ml LV vol s, MOD A4C: 80.8 ml LV SV MOD A4C:     120.0 ml IVC IVC diam: 1.90 cm LEFT ATRIUM         Index LA diam:    3.20 cm 1.97 cm/m  AORTIC VALVE LVOT Vmax:   116.00 cm/s LVOT Vmean:  81.700 cm/s LVOT VTI:    0.208 m  AORTA Ao Asc diam: 2.90 cm MR Peak grad:    69.2 mmHg    TRICUSPID VALVE MR Mean grad:    39.0 mmHg    TR Peak grad:   44.4 mmHg MR Vmax:         416.00 cm/s  TR Vmax:        333.00 cm/s MR Vmean:        281.0 cm/s MR PISA:         1.57 cm     SHUNTS MR PISA Eff ROA: 15 mm       Systemic VTI:  0.21 m MR PISA Radius:  0.50 cm      Systemic Diam: 1.40 cm Dietrich Pates MD Electronically signed by Dietrich Pates MD Signature Date/Time: 01/16/2023/1:43:45 PM    Final    DG Chest 2 View  Result Date: 01/15/2023 CLINICAL DATA:  Shortness of breath after COVID vaccination. EXAM: CHEST - 2 VIEW COMPARISON:  Chest radiograph 11/12/2022. Lung bases from concurrent abdominopelvic CT. FINDINGS: Moderate right pleural effusion, increased from prior exam. There is a small left pleural effusion, improved from September exam. Heart is upper normal in size. Interstitial thickening  may represent edema or be chronic, and is similar  to prior exam. No pneumothorax. IMPRESSION: 1. Moderate right pleural effusion, increased from prior exam. Small left pleural effusion, improved from September exam. 2. Interstitial thickening may represent edema or be chronic. Electronically Signed   By: Narda Rutherford M.D.   On: 01/15/2023 19:51   CT ABDOMEN PELVIS WO CONTRAST  Result Date: 01/15/2023 CLINICAL DATA:  Acute abdominal pain.  Diarrhea. EXAM: CT ABDOMEN AND PELVIS WITHOUT CONTRAST TECHNIQUE: Multidetector CT imaging of the abdomen and pelvis was performed following the standard protocol without IV contrast. RADIATION DOSE REDUCTION: This exam was performed according to the departmental dose-optimization program which includes automated exposure control, adjustment of the mA and/or kV according to patient size and/or use of iterative reconstruction technique. COMPARISON:  None Available. FINDINGS: Lower chest: Moderate right and small left pleural effusion. Associated compressive atelectasis in the right lower, right middle, and left lower lobes. The heart is upper normal in size, there is left ventricular dilatation. Hepatobiliary: Nodular hepatic contours typical of cirrhosis. No evidence of focal liver abnormality on this unenhanced exam. Small gallstones within the gallbladder. No biliary dilatation. Pancreas: No ductal dilatation or inflammation. Spleen: No splenomegaly. Adrenals/Urinary Tract: No adrenal nodule. Mild bilateral renal parenchymal atrophy. No hydronephrosis. No renal calculi. Urinary bladder is minimally distended, grossly normal for degree of distension. Stomach/Bowel: Detailed bowel assessment is limited due to the presence of ascites and lack of contrast. Equivocal gastric wall thickening. Equivocal duodenal wall thickening. There are duodenal diverticula. No small bowel distension. Normal appendix. Multifocal colonic diverticulosis without evidence of focal diverticulitis.  There is colonic wall thickening from the transverse colon through the distal descending colon. Small volume of formed colonic stool. Vascular/Lymphatic: Aortic and branch atherosclerosis. Retroaortic left renal vein. Suspected recannulated umbilical vein, not well assessed in the absence of IV contrast. There is no obvious bulky abdominopelvic adenopathy, although ascites limits detailed assessment. Reproductive: The uterus is not seen, presumed hysterectomy. No evidence of adnexal mass. Other: Moderate to large volume abdominopelvic ascites. Ascitic fluid is simple in density. Generalized edema of the subcutaneous and intra-abdominal fat. No free air. Musculoskeletal: Mild scoliosis and degenerative change in the spine. There are no acute or suspicious osseous abnormalities. IMPRESSION: 1. 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 2. Colonic wall thickening from the transverse colon through the distal descending colon, suspicious for colitis. This may be infectious or inflammatory. Distribution is not typical for portal colopathy. 3. Colonic diverticulosis without evidence of focal diverticulitis. 4. Cholelithiasis. 5. Moderate right and small left pleural effusions with associated compressive atelectasis. Aortic Atherosclerosis (ICD10-I70.0). Electronically Signed   By: Narda Rutherford M.D.   On: 01/15/2023 19:50    Microbiology: Results for orders placed or performed during the hospital encounter of 01/15/23  Resp panel by RT-PCR (RSV, Flu A&B, Covid) Anterior Nasal Swab     Status: None   Collection Time: 01/15/23  9:53 PM   Specimen: Anterior Nasal Swab  Result Value Ref Range Status   SARS Coronavirus 2 by RT PCR NEGATIVE NEGATIVE Final    Comment: (NOTE) SARS-CoV-2 target nucleic acids are NOT DETECTED.  The SARS-CoV-2 RNA is generally detectable in upper respiratory specimens during the acute phase of infection. The lowest concentration of SARS-CoV-2 viral copies  this assay can detect is 138 copies/mL. A negative result does not preclude SARS-Cov-2 infection and should not be used as the sole basis for treatment or other patient management decisions. A negative result may occur with  improper specimen collection/handling, submission of specimen other  than nasopharyngeal swab, presence of viral mutation(s) within the areas targeted by this assay, and inadequate number of viral copies(<138 copies/mL). A negative result must be combined with clinical observations, patient history, and epidemiological information. The expected result is Negative.  Fact Sheet for Patients:  BloggerCourse.com  Fact Sheet for Healthcare Providers:  SeriousBroker.it  This test is no t yet approved or cleared by the [place] FDA and  has been authorized for detection and/or diagnosis of SARS-CoV-2 by FDA under an Emergency Use Authorization (EUA). This EUA will remain  in effect (meaning this test can be used) for the duration of the COVID-19 declaration under Section 564(b)(1) of the Act, 21 U.S.C.section 360bbb-3(b)(1), unless the authorization is terminated  or revoked sooner.       Influenza A by PCR NEGATIVE NEGATIVE Final   Influenza B by PCR NEGATIVE NEGATIVE Final    Comment: (NOTE) The Xpert Xpress SARS-CoV-2/FLU/RSV plus assay is intended as an aid in the diagnosis of influenza from Nasopharyngeal swab specimens and should not be used as a sole basis for treatment. Nasal washings and aspirates are unacceptable for Xpert Xpress SARS-CoV-2/FLU/RSV testing.  Fact Sheet for Patients: BloggerCourse.com  Fact Sheet for Healthcare Providers: SeriousBroker.it  This test is not yet approved or cleared by the [place] FDA and has been authorized for detection and/or diagnosis of SARS-CoV-2 by FDA under an Emergency Use Authorization (EUA). This EUA  will remain in effect (meaning this test can be used) for the duration of the COVID-19 declaration under Section 564(b)(1) of the Act, 21 U.S.C. section 360bbb-3(b)(1), unless the authorization is terminated or revoked.     Resp Syncytial Virus by PCR NEGATIVE NEGATIVE Final    Comment: (NOTE) Fact Sheet for Patients: BloggerCourse.com  Fact Sheet for Healthcare Providers: SeriousBroker.it  This test is not yet approved or cleared by the [place] FDA and has been authorized for detection and/or diagnosis of SARS-CoV-2 by FDA under an Emergency Use Authorization (EUA). This EUA will remain in effect (meaning this test can be used) for the duration of the COVID-19 declaration under Section 564(b)(1) of the Act, 21 U.S.C. section 360bbb-3(b)(1), unless the authorization is terminated or revoked.  Performed at [facility], [address] [place], [place] [address]   C Difficile Quick Screen w PCR reflex     Status: None   Collection Time: 01/16/23  3:17 AM   Specimen: STOOL  Result Value Ref Range Status   C Diff antigen NEGATIVE NEGATIVE Final   C Diff toxin NEGATIVE NEGATIVE Final   C Diff interpretation No C. difficile detected.  Final    Comment: Performed at [facility], [address] [place], [place] [address]  Gastrointestinal Panel by PCR , Stool     Status: None   Collection Time: 01/16/23  3:17 AM   Specimen: Stool  Result Value Ref Range Status   Campylobacter species NOT DETECTED NOT DETECTED Final   Plesimonas shigelloides NOT DETECTED NOT DETECTED Final   Salmonella species NOT DETECTED NOT DETECTED Final   Yersinia enterocolitica NOT DETECTED NOT DETECTED Final   Vibrio species NOT DETECTED NOT DETECTED Final   Vibrio cholerae NOT DETECTED NOT DETECTED Final   Enteroaggregative E coli (EAEC) NOT DETECTED NOT DETECTED Final   Enteropathogenic E coli (EPEC) NOT DETECTED NOT  DETECTED Final   Enterotoxigenic E coli (ETEC) NOT DETECTED NOT DETECTED Final   Shiga like toxin producing E coli (STEC) NOT DETECTED NOT DETECTED Final  Shigella/Enteroinvasive E coli (EIEC) NOT DETECTED NOT DETECTED Final   Cryptosporidium NOT DETECTED NOT DETECTED Final   Cyclospora cayetanensis NOT DETECTED NOT DETECTED Final   Entamoeba histolytica NOT DETECTED NOT DETECTED Final   Giardia lamblia NOT DETECTED NOT DETECTED Final   Adenovirus F40/41 NOT DETECTED NOT DETECTED Final   Astrovirus NOT DETECTED NOT DETECTED Final   Norovirus GI/GII NOT DETECTED NOT DETECTED Final   Rotavirus A NOT DETECTED NOT DETECTED Final   Sapovirus (I, II, IV, and V) NOT DETECTED NOT DETECTED Final    Comment: Performed at [facility], [address] [place], [place] [address]  Culture, blood (Routine X 2) w Reflex to ID Panel     Status: None   Collection Time: 01/16/23  4:55 AM   Specimen: BLOOD LEFT HAND  Result Value Ref Range Status   Specimen Description BLOOD LEFT HAND  Final   Special Requests   Final    BOTTLES DRAWN AEROBIC AND ANAEROBIC Blood Culture adequate volume   Culture   Final    NO GROWTH 5 DAYS Performed at [facility], [address] [place], [place] [address]    Report Status 01/21/2023 FINAL  Final  Culture, blood (Routine X 2) w Reflex to ID Panel     Status: None   Collection Time: 01/16/23  4:55 AM   Specimen: BLOOD  Result Value Ref Range Status   Specimen Description BLOOD LEFT ANTECUBITAL  Final   Special Requests   Final    BOTTLES DRAWN AEROBIC AND ANAEROBIC Blood Culture adequate volume   Culture   Final    NO GROWTH 5 DAYS Performed at [facility], [address] [place], [place] [address]    Report Status 01/21/2023 FINAL  Final  Culture, body fluid w Gram Stain-bottle     Status: None   Collection Time: 01/16/23 11:29 AM   Specimen: Peritoneal Washings  Result Value Ref Range Status   Specimen Description PERITONEAL ABDOMEN   Final   Special Requests NONE  Final   Culture   Final    NO GROWTH 5 DAYS Performed at [facility], [address], [place], [place] [address]    Report Status 01/21/2023 FINAL  Final  Gram stain     Status: None   Collection Time: 01/16/23 11:29 AM   Specimen: Peritoneal Washings  Result Value Ref Range Status   Specimen Description PERITONEAL ABDOMEN  Final   Special Requests NONE  Final   Gram Stain   Final    WBC PRESENT, PREDOMINANTLY MONONUCLEAR NO ORGANISMS SEEN CYTOSPIN SMEAR Performed at [facility], [address] [place], [place] [address]    Report Status 01/16/2023 FINAL  Final    Labs: CBC: Recent Labs  Lab 01/15/23 1552 01/16/23 0455 01/17/23 0325  WBC 8.9 8.2 9.7  NEUTROABS 6.4  --   --   HGB 11.3* 9.7* 9.1*  HCT 31.3* 27.0* 25.8*  MCV 97.5 98.2 98.1  PLT 151 135* 135*   Basic Metabolic Panel: Recent Labs  Lab 01/16/23 0455 01/17/23 0325 01/18/23 0423 01/19/23 0558 01/20/23 0558  NA 130* 133* 130* 132* 127*  K 3.4* 4.6 3.7 3.8 4.0  CL 94* 97* 95* 99 93*  CO2 24 23 23 22 22   GLUCOSE 108* 137* 201* 150* 161*  BUN 29* 32* 38* 46* 55*  CREATININE 2.52* 2.55* 2.92* 3.85* 4.40*  CALCIUM 9.3 9.8 9.8 9.7 9.5   Liver Function Tests:  Recent Labs  Lab 01/15/23 1552 01/16/23 0455 01/17/23 0325 01/18/23 0423  AST 46* 43* 62* 53*  ALT 13 16 17 17   ALKPHOS 46 40 36* 32*  BILITOT 8.7* 7.7* 7.9* 8.0*  PROT 7.1 6.2* 6.3* 6.9  ALBUMIN 3.2* 2.5* 3.0* 3.6   CBG: Recent Labs  Lab 01/16/23 0725 01/16/23 1138 01/16/23 1616 01/17/23 0731 01/17/23 1156  GLUCAP 97 97 161* 126* 199*    Discharge time spent: less than 30 minutes.  Signed: Rickey Barbara, MD [facility] 01/21/2023

## 2023-01-21 NOTE — Progress Notes (Addendum)
This chaplain responded to the unit consult for Pt. prayer in the setting of the Pt. transition to comfort care. The chaplain entered the room with PMT NP-Amber and the Pt. Son-Darryl.   The Pt. acknowledged the NP and chaplain's presence. Darryl accepted the chaplain's invitation to sit beside the Pt. and talk without the chaplain's presence. The chaplain will return for prayer with the Pt. as needed.  Chaplain Stephanie Acre [phone]

## 2023-01-21 NOTE — Progress Notes (Signed)
This chaplain responded to RN-Katie page for notary. The chaplain reviewed the document and provided education on the hospital's policy of providing notary services for Advance Directive's only. The chaplain was unable to provide notary services. The family accepted the policy and will pursue a notary with Hospice services.  Chaplain Stephanie Acre [phone]

## 2023-01-21 NOTE — Progress Notes (Signed)
Heart Failure Navigator Progress Note  Assessed for Heart & Vascular TOC clinic readiness.  Patient does not meet criteria due to transitioning to comfort care. .   Navigator will sign off at this time.   Rhae Hammock, BSN, Scientist, clinical (histocompatibility and immunogenetics) Only

## 2023-01-21 NOTE — Progress Notes (Signed)
Daily Progress Note   Patient Name: Jessica Torres       Date: 01/21/2023 DOB: 11/08/1940  Age: 82 y.o. MRN#: [id number] Attending Physician: Jerald Kief, MD Primary Care Physician: Rebekah Chesterfield, NP Admit Date: 01/15/2023  Reason for Consultation/Follow-up: Establishing goals of care  Subjective: I have reviewed medical records including EPIC notes, MAR, and labs. Noted after receiving dilaudid last night patient developed itching and rash - dilaudid was discontinued. Received report from primary RN - no acute concerns. RN reports patient has slept a lot today but is easily arousable; continues with poor oral intake. Discussed medication updates with RN in light of possible dilaudid allergy.   Went to visit patient at bedside - son/Darryl at bedside. Patient is lying in bed awake. She reports feeling "tired." No signs or non-verbal gestures of pain or discomfort noted. No respiratory distress, increased work of breathing, or secretions noted. She is on 3L O2 .   Emotional support provided to Darryl - chaplain also presents to bedside to offer prayer and support. Darryl requests to be added to patient's emergency contact list so he can call and receive updates - information taken.  All questions and concerns addressed. Encouraged to call with questions and/or concerns. PMT card provided.  10:03 AM Called son/Chris - emotional support provided. Therapeutic listening provided as he reflects feeling at peace with the decision he made for patient's transition to full comfort measures yesterday. He understands patient had a reaction after dilaudid administration - provided updates regarding medication changes - he expressed understanding. Provided updates to Thayer Ohm that [facility] should be calling  with updates regarding patient's transfer today and we certainly would keep him updated - he expressed appreciation.   All questions and concerns addressed. Encouraged to call with questions and/or concerns. PMT card previously provided.  4:05 PM Received notification from [facility] that [facility] patient and also has a bed available - patient will be discharged today.  4:40 PM Received notification from primary RN that patient is very uncomfortable and itching despite administration of 25mg  benadryl about an hour ago. Will increase dose to 50mg  and can give dose now.  Length of Stay: 5  Current Medications: Scheduled Meds:   antiseptic oral rinse  15 mL Topical BID   furosemide  80 mg Intravenous BID  metoprolol succinate  12.5 mg Oral Daily    Continuous Infusions:   PRN Meds: acetaminophen **OR** acetaminophen, alum & mag hydroxide-simeth, diphenhydrAMINE, feeding supplement, fentaNYL (SUBLIMAZE) injection, glycopyrrolate **OR** glycopyrrolate **OR** glycopyrrolate, haloperidol **OR** haloperidol **OR** haloperidol lactate, HYDROcodone-acetaminophen, LORazepam **OR** LORazepam **OR** LORazepam, ondansetron **OR** ondansetron (ZOFRAN) IV, polyvinyl alcohol  Physical Exam Vitals and nursing note reviewed.  Constitutional:      General: She is not in acute distress. Pulmonary:     Effort: No respiratory distress.  Skin:    General: Skin is warm and dry.  Neurological:     Mental Status: She is alert.             Vital Signs: BP (!) 94/51 (BP Location: Left Arm)   Pulse 88   Temp 97.9 F (36.6 C) (Oral)   Resp 17   Ht 5\' 5"  (1.651 m)   Wt 57.5 kg   SpO2 99%   BMI 21.08 kg/m  SpO2: SpO2: 99 % O2 Device: O2 Device: Nasal Cannula O2 Flow Rate: O2 Flow Rate (L/min): 3 L/min  Intake/output summary:  Intake/Output Summary (Last 24 hours) at 01/21/2023 0902 Last data filed at 01/20/2023 1500 Gross per 24 hour  Intake 100 ml  Output --  Net 100 ml   LBM: Last  BM Date : 01/20/23 Baseline Weight: Weight: 56.9 kg Most recent weight: Weight: 57.5 kg       Palliative Assessment/Data: PPS 20%      Patient Active Problem List   Diagnosis Date Noted   Ascites 01/16/2023   Colitis 01/16/2023   Hyperbilirubinemia 01/16/2023   Transaminitis 01/16/2023   Chronic hypoxic respiratory failure (HCC) 01/16/2023   Gout 01/16/2023   Elevated troponin 01/16/2023   Ischemic cardiomyopathy 01/16/2023   Chronic idiopathic thrombocytopenia (HCC) 01/16/2023   Acute exacerbation of CHF (congestive heart failure) (HCC) 01/15/2023   Acute on chronic systolic CHF (congestive heart failure) (HCC) 11/12/2022   Palliative care encounter 11/01/2022   Bilateral pleural effusion 10/31/2022   Hyponatremia 10/30/2022   Acute on chronic combined systolic and diastolic CHF (congestive heart failure) (HCC) 10/29/2022   AKI (acute kidney injury) (HCC) 10/29/2022   Normocytic anemia 07/15/2022   Melena 07/15/2022   Decompensated cirrhosis (HCC) 07/13/2022   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   Gastritis and gastroduodenitis 04/28/2022   NSTEMI (non-ST elevated myocardial infarction) (HCC) 04/27/2022   Gastrointestinal hemorrhage 04/27/2022   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 disease 04/27/2022   Heart failure with preserved ejection fraction (HCC) 04/27/2022   Chronic cough 04/27/2022   History of CVA (cerebrovascular accident) 04/27/2022   Osteopenia 11/28/2020   Iron deficiency 10/11/2020   CKD (chronic kidney disease), stage IV (HCC) 05/25/2020   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   Chronic bilateral low back pain without sciatica 07/13/2019   Carotid stenosis, right 12/10/2017   Occlusion and stenosis of right carotid artery 12/10/2017   Left hip pain 06/04/2017   Claudication in peripheral vascular disease (HCC) 10/30/2016   Vitamin D deficiency 05/07/2016   Gastroesophageal reflux disease without esophagitis 01/28/2016   Coronary artery disease due  to lipid rich plaque 10/18/2015   Hypothyroidism 10/18/2015   S/P -Billat SFA PTA with restenosis  07/06/2014   Peripheral vascular angioplasty status 07/06/2014   Hypertension associated with diabetes (HCC) 06/28/2014   Hyperlipidemia-statin intolerant 06/28/2014   Insulin dependent type 2 diabetes mellitus (HCC) 06/28/2014   Peripheral arterial disease - Bilateral SFA  06/28/2014    Palliative Care Assessment & Plan   Patient Profile:  82 y.o. female  with past medical history of chronic systolic heart failure with reduced EF 30 to 35%, CAD, DM type II, CKD stage V, hypothyroidism, gout, hepatic cirrhosis with history of esophageal varices, chronic hypoxic respiratory failure 2 L oxygen at baseline, chronic anemia and chronic thrombocytopenia admitted on 01/15/2023 with shortness of breath, cough abdominal pain, diarrhea, BL LE swelling and distention.    Patient has been admitted 3 times in the past 6 months for recurrent exacerbations of her chronic diseases.  She is now admitted for acute CHF exacerbation, MI type II, AKI on CKD 4, decompensated cirrhosis, transaminitis, hyperbilirubinemia, initial concern for SBP/colitis, and bilateral pleural effusion.   Patient is status post paracentesis 11/8 with 3 L of fluid removed. PMT has been consulted to assist with goals of care conversation.  Assessment: Principal Problem:   Acute exacerbation of CHF (congestive heart failure) (HCC) Active Problems:   Insulin dependent type 2 diabetes mellitus (HCC)   Hypothyroidism   CKD (chronic kidney disease), stage IV (HCC)   Decompensated cirrhosis (HCC)   Normocytic anemia   AKI (acute kidney injury) (HCC)   Hyponatremia   Bilateral pleural effusion   Ascites   Colitis   Hyperbilirubinemia   Transaminitis   Chronic hypoxic respiratory failure (HCC)   Gout   Elevated troponin   Ischemic cardiomyopathy   Chronic idiopathic thrombocytopenia (HCC)   Terminal  care  Recommendations/Plan: Continue full comfort measures Continue DNR/DNI as previously documented Transfer to [facility] pending bed availability and eligibility confirmation Comfort medications adjusted as noted below PMT will continue to follow and support holistically  Symptom Management Dilaudid discontinued; started fentanyl PRN pain/dyspnea/increased work of breathing/RR>25 Tylenol PRN pain/fever Biotin twice daily Benadryl PRN itching - increased dose Robinul PRN secretions Haldol PRN agitation/delirium Ativan PRN anxiety/seizure/sleep/distress Zofran PRN nausea/vomiting Liquifilm Tears PRN dry eye  Goals of Care and Additional Recommendations: Limitations on Scope of Treatment: Full Comfort Care  Code Status:    Code Status Orders  (From admission, onward)           Start     Ordered   01/20/23 2042  Do not attempt resuscitation (DNR) - Comfort care  Continuous       Question Answer Comment  If patient has no pulse and is not breathing Do Not Attempt Resuscitation   In Pre-Arrest Conditions (Patient Is Breathing and Has a Pulse) Provide comfort measures. Relieve any mechanical airway obstruction. Avoid transfer unless required for comfort.   Consent: Discussion documented in EHR or advanced directives reviewed      01/20/23 2052           Code Status History     Date Active Date Inactive Code Status Order ID Comments User Context   01/16/2023 0314 01/20/2023 2052 Limited: Do not attempt resuscitation (DNR) -DNR-LIMITED -Do Not Intubate/DNI  [id number]  Tereasa Coop, MD Inpatient   11/12/2022 2029 11/15/2022 1906 Limited: Do not attempt resuscitation (DNR) -DNR-LIMITED -Do Not Intubate/DNI  [id number]  Briscoe Deutscher, MD Inpatient   11/05/2022 1015 11/07/2022 2047 Limited: Do not attempt resuscitation (DNR) -DNR-LIMITED -Do Not Intubate/DNI  [id number]  Zannie Cove, MD Inpatient   10/29/2022 2115 11/05/2022 1015 Full Code [id number]  Anselm Jungling,  DO Inpatient   07/11/2022 2003 07/17/2022 2107 Full Code [id number]  Levie Heritage, DO ED   04/27/2022 0730 04/30/2022 2203 Full Code [id number]  Clydie Braun, MD ED   10/30/2016 1449 10/31/2016 1441 Full Code [id number]  Nanetta Batty  J, MD Inpatient   01/22/2015 1454 01/23/2015 1435 Full Code [id number]  Runell Gess, MD Inpatient   01/11/2015 0927 01/11/2015 1728 Full Code [id number]  Runell Gess, MD Inpatient   08/17/2014 1045 08/18/2014 1914 Full Code [id number]  Runell Gess, MD Inpatient   07/06/2014 1505 07/07/2014 1455 Full Code [id number]  Runell Gess, MD Inpatient      Advance Directive Documentation    Flowsheet Row Most Recent Value  Type of Advance Directive Healthcare Power of Attorney  Pre-existing out of facility DNR order (yellow form or pink MOST form) --  "MOST" Form in Place? --       Prognosis:  < 2 weeks  Discharge Planning: Hospice facility  Care plan was discussed with primary RN, patient's family, attending, TOC  Thank you for allowing the Palliative Medicine Team to assist in the care of this patient.     Haskel Khan, NP  Please contact Palliative Medicine Team phone at [phone] for questions and concerns.   *Portions of this note are a verbal dictation therefore any spelling and/or grammatical errors are due to the "[employer]" system interpretation.

## 2023-01-21 NOTE — Hospital Course (Signed)
82/F with history of systolic CHF, EF 81-19%, severe MR, CKD 4, liver cirrhosis, portal hypertension, esophageal varices, presumed CAD, chronic respiratory failure on 2 L O2, type 2 diabetes mellitus, history of chronic blood loss anemia and GAVE's disease, history of CVA, hypothyroidism with multiple hospitalizations for heart failure presented to the ED 11/7 with progressive shortness of breath, abdominal swelling, generalized weakness and some diarrhea. -In the ED she was tachycardic, blood pressure 150/62, lactate 1.9, labs with sodium 131, creatinine 2.5, albumin 3.2, hemoglobin 11.3, WBC 8.9, troponin 78, 69, bili 3.9, BNP 2619, chest x-ray with moderate right pleural effusion and interstitial thickening?  Edema CT abdomen pelvis with cirrhosis, moderate to large volume ascites, colon wall thickening?  Colitis, moderate right and small left effusions -11/8, paracentesis 3 L of amber fluid drained -11/9, palliative care meeting, continue DNR/DNI -GI recommended palliative care and signed off -Cardiology following, palliative care following as well, kidney function continues to worsen, poor response to diuretics -Palliative meeting 11/10, family agrees to residential hospice

## 2023-02-08 DEATH — deceased

## 2023-03-25 NOTE — Progress Notes (Deleted)
  Pt is  no show for appt  

## 2023-03-26 ENCOUNTER — Ambulatory Visit: Payer: Self-pay | Attending: Internal Medicine | Admitting: Internal Medicine

## 2023-12-23 ENCOUNTER — Encounter (INDEPENDENT_AMBULATORY_CARE_PROVIDER_SITE_OTHER): Payer: Self-pay | Admitting: Gastroenterology
